# Patient Record
Sex: Female | Born: 1950 | Race: Black or African American | Hispanic: No | Smoking: Former smoker
Health system: Southern US, Community
[De-identification: ages and names within clinical notes are randomized; demographics above are authoritative.]

## PROBLEM LIST (undated history)

## (undated) DIAGNOSIS — R51 Headache: Secondary | ICD-10-CM

## (undated) DIAGNOSIS — Z8719 Personal history of other diseases of the digestive system: Secondary | ICD-10-CM

## (undated) DIAGNOSIS — R002 Palpitations: Secondary | ICD-10-CM

## (undated) DIAGNOSIS — M961 Postlaminectomy syndrome, not elsewhere classified: Secondary | ICD-10-CM

## (undated) DIAGNOSIS — R519 Headache, unspecified: Secondary | ICD-10-CM

## (undated) DIAGNOSIS — T7840XA Allergy, unspecified, initial encounter: Secondary | ICD-10-CM

## (undated) DIAGNOSIS — E7800 Pure hypercholesterolemia, unspecified: Secondary | ICD-10-CM

## (undated) DIAGNOSIS — R200 Anesthesia of skin: Secondary | ICD-10-CM

## (undated) DIAGNOSIS — K219 Gastro-esophageal reflux disease without esophagitis: Secondary | ICD-10-CM

## (undated) DIAGNOSIS — E119 Type 2 diabetes mellitus without complications: Secondary | ICD-10-CM

## (undated) HISTORY — DX: Allergy, unspecified, initial encounter: T78.40XA

## (undated) HISTORY — DX: Palpitations: R00.2

## (undated) HISTORY — PX: SHOULDER SURGERY: SHX246

## (undated) HISTORY — DX: Anesthesia of skin: R20.0

## (undated) HISTORY — PX: COLONOSCOPY: SHX174

## (undated) HISTORY — PX: BREAST SURGERY: SHX581

## (undated) HISTORY — PX: BACK SURGERY: SHX140

## (undated) HISTORY — PX: UPPER GI ENDOSCOPY: SHX6162

---

## 1980-04-26 HISTORY — PX: ABDOMINAL HYSTERECTOMY: SHX81

## 2010-10-29 ENCOUNTER — Encounter: Payer: Self-pay | Admitting: Family Medicine

## 2010-10-29 ENCOUNTER — Ambulatory Visit (INDEPENDENT_AMBULATORY_CARE_PROVIDER_SITE_OTHER): Payer: Self-pay | Admitting: Family Medicine

## 2010-10-29 DIAGNOSIS — E785 Hyperlipidemia, unspecified: Secondary | ICD-10-CM

## 2010-10-29 DIAGNOSIS — K219 Gastro-esophageal reflux disease without esophagitis: Secondary | ICD-10-CM

## 2010-10-29 DIAGNOSIS — I10 Essential (primary) hypertension: Secondary | ICD-10-CM | POA: Insufficient documentation

## 2010-10-29 DIAGNOSIS — M545 Low back pain, unspecified: Secondary | ICD-10-CM | POA: Insufficient documentation

## 2010-10-29 DIAGNOSIS — E1169 Type 2 diabetes mellitus with other specified complication: Secondary | ICD-10-CM | POA: Insufficient documentation

## 2010-10-29 DIAGNOSIS — G8929 Other chronic pain: Secondary | ICD-10-CM

## 2010-10-29 NOTE — Assessment & Plan Note (Addendum)
Her blood pressure is elevated today. Normally she says it runs in the normal range this is a little high for her. We'll recheck her followup in 2 months to make sure that she is truly well controlled. His been a year since her last set of blood work but unfortunately she does not have insurance so we will have to hold on this until she gets some type of medical coverage.

## 2010-10-29 NOTE — Assessment & Plan Note (Signed)
At this point she has chronic low back pain for last 3 years status post fall. She's been treated mainly with narcotic medications. She has never had formal physical therapy. She is also not had any other imaging such as an MRI to further delineate if she may have disc or neural impingement. At this point in time I think she really needs an MRI to see if any further intervention is warranted especially since her pain radiates into both legs. Unfortunately she does not have health insurance at this time. She will get insurance in approximately 6 months. I discussed with her applying to the meniscal health program that can sometimes assist with her medical epidurals in the interim. If she is able to get this and we can hopefully go ahead and move forward in order an MRI and possibly get her in with physical therapy. In the meantime she was given a handout for exercises to do for her low back. She R. he takes hydrocodone when necessary. She said she does not need refills today.

## 2010-10-29 NOTE — Progress Notes (Signed)
Subjective:    Patient ID: Amanda Morris, female    DOB: [birth year], 60 y.o.   MRN: [id number]  HPI Back pain started when fell at work in 02/2008. Feel forward and hit her her right shoulder and then fell backwards.  Has had 2 shoulder surgeries.  REgular MD was following her. Has had xrays done and told OA. Pain has persisted. Pain radiates into both legs down to her feel. Occ numbnes and tingling.  Pain is mostly in her back and hips. Never did PT for her back, but did for her shoulder after surgery. Also has bilateal knee pain.  She c/o of persistant stiffness.    Review of Systems  Constitutional: Negative for fever, diaphoresis and unexpected weight change.       Weakness. Weight gain.   HENT: Positive for tinnitus. Negative for hearing loss and rhinorrhea.        Allergies  Eyes: Negative for visual disturbance.  Respiratory: Negative for cough and wheezing.   Cardiovascular: Positive for palpitations. Negative for chest pain.  Gastrointestinal: Positive for vomiting. Negative for nausea, diarrhea and blood in stool.  Genitourinary: Negative for vaginal bleeding, vaginal discharge and difficulty urinating.       Nocturia  Musculoskeletal: Positive for myalgias and joint swelling. Negative for arthralgias.  Skin: Negative for rash.  Neurological: Positive for dizziness and headaches.  Hematological: Negative for adenopathy. Does not bruise/bleed easily.  Psychiatric/Behavioral: Negative for sleep disturbance and dysphoric mood. The patient is not nervous/anxious.    BP 145/97  Pulse 82  Ht 5\' 4"  (1.626 m)  Wt 236 lb (107.049 kg)  BMI 40.51 kg/m2    Allergies  Allergen Reactions  . Penicillins     Past Medical History  Diagnosis Date  . Allergy   . Palpitations     Past Surgical History  Procedure Date  . Abdominal hysterectomy 1982  . Shoulder surgery 06/2008, 09/2010    Right, by Dr. Anthoney Harada, then Dr. Bayard Beaver    History   Social History  . Marital  Status: Married    Spouse Name: N/A    Number of Children: N/A  . Years of Education: 10th grade   Occupational History  . Disabled.     Social History Main Topics  . Smoking status: Former Smoker    Types: Cigarettes    Quit date: 07/30/2007  . Smokeless tobacco: Not on file  . Alcohol Use: No  . Drug Use: No  . Sexually Active: No   Other Topics Concern  . Not on file   Social History Narrative   Caffeine intake. No regular exercise.    Family History  Problem Relation Age of Onset  . Heart disease Mother   . Diabetes Mother   . Hypertension Mother   . Stroke Mother   . Heart disease Father   . Cancer Sister   . Heart disease Sister   . Diabetes Sister   . Hyperlipidemia Sister   . Hypertension Sister   . Stroke Sister   . Alcohol abuse Brother   . Hyperlipidemia Brother     Current outpatient prescriptions:aspirin 81 MG tablet, Take 81 mg by mouth daily.  , Disp: , Rfl: ;  doxepin (SINEQUAN) 25 MG capsule, Take 25 mg by mouth 2 (two) times daily. , Disp: , Rfl: ;  fish oil-omega-3 fatty acids 1000 MG capsule, Take 2 g by mouth daily.  , Disp: , Rfl: ;  HYDROcodone-acetaminophen (VICODIN ES) 7.5-750 MG per tablet, Take 1 tablet  by mouth every 6 (six) hours as needed.  , Disp: , Rfl:  metoprolol (TOPROL-XL) 50 MG 24 hr tablet, Take 50 mg by mouth 2 (two) times daily.  , Disp: , Rfl: ;  omeprazole (PRILOSEC) 20 MG capsule, Take 20 mg by mouth daily.  , Disp: , Rfl:      Objective:   Physical Exam  Constitutional: She is oriented to person, place, and time. She appears well-developed and well-nourished.  HENT:  Head: Normocephalic and atraumatic.  Cardiovascular: Normal rate, regular rhythm and normal heart sounds.   Pulmonary/Chest: Effort normal and breath sounds normal.  Musculoskeletal:       Decreased lumbar flexion to about 60. Decreased extension. Normal rotation right and left but she has pain with this. Side bending is slightly decreased to the left  compared to the right. She is tender over the entire lumbar spine as well as the right and left paraspinous muscles. She's also tender over both SI joints. Negative straight leg raise bilaterally. Hip knee and ankle strength 5/5/. She does have a lot of crepitus in both knees.  Neurological: She is alert and oriented to person, place, and time.  Skin: Skin is warm and dry.  Psychiatric: She has a normal mood and affect.          Assessment & Plan:

## 2010-11-03 ENCOUNTER — Encounter: Payer: Self-pay | Admitting: Family Medicine

## 2010-11-03 DIAGNOSIS — I471 Supraventricular tachycardia: Secondary | ICD-10-CM | POA: Insufficient documentation

## 2010-12-31 ENCOUNTER — Encounter: Payer: Self-pay | Admitting: Family Medicine

## 2010-12-31 ENCOUNTER — Other Ambulatory Visit: Payer: Self-pay | Admitting: Family Medicine

## 2010-12-31 ENCOUNTER — Ambulatory Visit (INDEPENDENT_AMBULATORY_CARE_PROVIDER_SITE_OTHER): Payer: Self-pay | Admitting: Family Medicine

## 2010-12-31 ENCOUNTER — Telehealth: Payer: Self-pay | Admitting: Family Medicine

## 2010-12-31 VITALS — BP 127/89 | HR 96 | Wt 226.0 lb

## 2010-12-31 DIAGNOSIS — R509 Fever, unspecified: Secondary | ICD-10-CM

## 2010-12-31 DIAGNOSIS — R109 Unspecified abdominal pain: Secondary | ICD-10-CM

## 2010-12-31 DIAGNOSIS — N760 Acute vaginitis: Secondary | ICD-10-CM

## 2010-12-31 DIAGNOSIS — R300 Dysuria: Secondary | ICD-10-CM

## 2010-12-31 LAB — CBC WITH DIFFERENTIAL/PLATELET
HCT: 39.8 % (ref 36.0–46.0)
Hemoglobin: 12.5 g/dL (ref 12.0–15.0)
Lymphocytes Relative: 39 % (ref 12–46)
Lymphs Abs: 3.7 10*3/uL (ref 0.7–4.0)
MCH: 24 pg — ABNORMAL LOW (ref 26.0–34.0)
MCHC: 31.6 g/dL (ref 30.0–36.0)
MCV: 76.4 fL — ABNORMAL LOW (ref 78.0–100.0)
Monocytes Absolute: 0.2 10*3/uL (ref 0.1–1.0)
Monocytes Relative: 2 % — ABNORMAL LOW (ref 3–12)
Neutro Abs: 5.6 10*3/uL (ref 1.7–7.7)
Neutrophils Relative %: 59 % (ref 43–77)
Platelets: 293 10*3/uL (ref 150–400)
RBC: 5.2 MIL/uL — ABNORMAL HIGH (ref 3.87–5.11)
RDW: 15.5 % (ref 11.5–15.5)
WBC: 9.5 10*3/uL (ref 4.0–10.5)

## 2010-12-31 LAB — COMPLETE METABOLIC PANEL WITH GFR
ALT: 17 U/L (ref 0–35)
AST: 25 U/L (ref 0–37)
Albumin: 3.5 g/dL (ref 3.5–5.2)
Alkaline Phosphatase: 95 U/L (ref 39–117)
BUN: 5 mg/dL — ABNORMAL LOW (ref 6–23)
CO2: 28 mEq/L (ref 19–32)
Calcium: 9.1 mg/dL (ref 8.4–10.5)
Chloride: 104 mEq/L (ref 96–112)
Creat: 0.8 mg/dL (ref 0.50–1.10)
GFR, Est African American: >60 mL/min (ref >60)
GFR, Est Non African American: >60 mL/min (ref >60)
Glucose, Bld: 170 mg/dL — ABNORMAL HIGH (ref 70–99)
Potassium: 3.9 mEq/L (ref 3.5–5.3)
Sodium: 138 mEq/L (ref 135–145)
Total Bilirubin: 0.5 mg/dL (ref 0.3–1.2)
Total Protein: 6.7 g/dL (ref 6.0–8.3)

## 2010-12-31 LAB — POCT URINALYSIS DIPSTICK
Blood, UA: NEGATIVE
Glucose, UA: NEGATIVE
Leukocytes, UA: NEGATIVE
Nitrite, UA: NEGATIVE
Protein, UA: NEGATIVE
Spec Grav, UA: 1.025
Urobilinogen, UA: 1
pH, UA: 6

## 2010-12-31 MED ORDER — FLUCONAZOLE 150 MG PO TABS: 150.0000 mg | ORAL_TABLET | Freq: Once | ORAL | Status: AC

## 2010-12-31 MED ORDER — SULFAMETHOXAZOLE-TMP DS 800-160 MG PO TABS: 1.0000 | ORAL_TABLET | Freq: Two times a day (BID) | ORAL | Status: AC

## 2010-12-31 MED ORDER — METRONIDAZOLE 500 MG PO TABS: 500.0000 mg | ORAL_TABLET | Freq: Three times a day (TID) | ORAL | Status: AC

## 2010-12-31 NOTE — Progress Notes (Signed)
  Subjective:    Patient ID: Amanda Morris, female    DOB: 1950-09-11, 60 y.o.   MRN: [id number]  HPI She stopped her metoprolol for palpitations. She says she felt she no longer needed it. She wasn't really using it for BP.   Went ED for abdominal pain at [facility] about 3-4 weeks ago.  Still has a vaginal  rash. Using vagisil and monistat 7.  Know when urinates the feels like labor pains.  Has been vomiting frequently.  Temp 100.3.  Just did xrays at hos per the pt. .  She is having lower abdominal pain as well. No vomiting or diarrhea. No known triggers. No alleviating or worsening of symptoms. She is not currently taking any medications for this.  Per hosp records they did do a renal CT to urle out stones and it was neg.  no blood in the stool or urine.   Review of Systems     Objective:   Physical Exam  Constitutional: She is oriented to person, place, and time. She appears well-developed and well-nourished.  HENT:  Head: Normocephalic and atraumatic.  Cardiovascular: Normal rate, regular rhythm and normal heart sounds.   Pulmonary/Chest: Effort normal and breath sounds normal.  Abdominal: Soft. Bowel sounds are normal. She exhibits no distension and no mass. There is tenderness. There is no rebound and no guarding.       Tender in teh LUQ and LLQ  And RLQ.  She is very tendre.  No rebounc.  Dec BS.   Musculoskeletal: She exhibits no edema.  Neurological: She is alert and oriented to person, place, and time.  Skin: Skin is warm and dry.  Psychiatric: She has a normal mood and affect. Her behavior is normal.          Assessment & Plan:  Abdominal Pain with tenderness. Worriesome for diverticulitis. UA is neg her but will send a culture.  Will also send a wet prep and go ahed and tx for yeast infection. She did have a negative abdominal CT for renal stones approximately 3 or 4 weeks ago. I did receive a copy of the report from the emergency room. Because she does not have  insurance at this time, i will treat her as if she is having an acute episode of diverticulitis. I did order a CBC stat as well as a CMP. Certainly if she gets worse easier the emergency department. Right now she can take by mouth since she is not vomiting.  Vaginitis-wet prep was collected. I will go ahead and send a dose of Diflucan for possible yeast vaginitis.

## 2010-12-31 NOTE — Telephone Encounter (Signed)
Lab added

## 2010-12-31 NOTE — Telephone Encounter (Signed)
Call labs and see if they can add an HgA1C.

## 2011-01-01 ENCOUNTER — Telehealth: Payer: Self-pay | Admitting: Family Medicine

## 2011-01-01 LAB — WET PREP, GENITAL
Clue Cells Wet Prep HPF POC: NONE SEEN
Trich, Wet Prep: NONE SEEN
Yeast Wet Prep HPF POC: NONE SEEN

## 2011-01-01 LAB — HEMOGLOBIN A1C
Hgb A1c MFr Bld: 9.2 % — ABNORMAL HIGH (ref <5.7)
Mean Plasma Glucose: 217 mg/dL — ABNORMAL HIGH (ref <117)

## 2011-01-01 NOTE — Telephone Encounter (Signed)
Callpt: Wet prep was normal but she can still take the diflucan we sent over and see if helps her sxs.

## 2011-01-01 NOTE — Telephone Encounter (Signed)
Call pt: WEt prep was neg.  Cn still take the diflucan we send over and see if feels better.

## 2011-01-02 LAB — URINE CULTURE: Colony Count: >=100000

## 2011-01-04 ENCOUNTER — Telehealth: Payer: Self-pay | Admitting: Family Medicine

## 2011-01-04 NOTE — Telephone Encounter (Signed)
Call patient: Based on her blood work she does have diabetes. Without a doubt. This is likely the cause of her recent abdominal pain. She is to make a followup appointment with me this week so that we can discuss treatment and getting her on medication. Also her urine culture was negative it was contaminated.

## 2011-01-04 NOTE — Telephone Encounter (Signed)
Pt aware.

## 2011-01-04 NOTE — Telephone Encounter (Signed)
Pt.notified

## 2011-01-11 ENCOUNTER — Ambulatory Visit (INDEPENDENT_AMBULATORY_CARE_PROVIDER_SITE_OTHER): Payer: Self-pay | Admitting: Family Medicine

## 2011-01-11 ENCOUNTER — Other Ambulatory Visit: Payer: Self-pay | Admitting: Family Medicine

## 2011-01-11 ENCOUNTER — Encounter: Payer: Self-pay | Admitting: Family Medicine

## 2011-01-11 DIAGNOSIS — E119 Type 2 diabetes mellitus without complications: Secondary | ICD-10-CM

## 2011-01-11 DIAGNOSIS — M549 Dorsalgia, unspecified: Secondary | ICD-10-CM

## 2011-01-11 DIAGNOSIS — R1110 Vomiting, unspecified: Secondary | ICD-10-CM

## 2011-01-11 LAB — POCT GLYCOSYLATED HEMOGLOBIN (HGB A1C): Hemoglobin A1C: 8.7

## 2011-01-11 MED ORDER — PROMETHAZINE HCL 25 MG PO TABS: 25.0000 mg | ORAL_TABLET | Freq: Four times a day (QID) | ORAL | Status: DC | PRN

## 2011-01-11 MED ORDER — FREESTYLE SYSTEM KIT: 1.0000 | PACK | Status: AC | PRN

## 2011-01-11 MED ORDER — GLIPIZIDE 5 MG PO TABS: 5.0000 mg | ORAL_TABLET | Freq: Two times a day (BID) | ORAL | Status: DC

## 2011-01-11 NOTE — Progress Notes (Signed)
  Subjective:    Patient ID: Amanda Morris, female    DOB: Feb 11, 1951, 60 y.o.   MRN: [id number]  HPI Sttill with abdominal pain and with vomiting.  I feel that she could have diverticulitis her white count was normal so I did treat her with antibiotics. She completed the ABX and is not any better. Feels the same, not worse.  She had a recent abdominal CT that was to evaluate for renal stones, that was negative.  Having back pain too that is worse after walking and gets sharp pains up and down the back. I would like to do an MRI of her lumbar spine without we'll need to wait for November until she gets health insurance. She does use her hydrocodone when necessary and has been very good about using it sparingly. At this point, recommended she avoid NSAIDs because she is having the nausea and vomiting. Tylenol be a better choice.  Here to f/u abnormal glucose seen on her labs.  + family hx of diabetes.  She says she really doesn't eat any sweet foods. She has never been told she said that before.   Review of Systems     Objective:   Physical Exam  Constitutional: She is oriented to person, place, and time. She appears well-developed and well-nourished.  HENT:  Head: Normocephalic and atraumatic.  Cardiovascular: Normal rate, regular rhythm and normal heart sounds.   Pulmonary/Chest: Effort normal and breath sounds normal.  Abdominal: Soft. Bowel sounds are normal. There is tenderness.       Diffusely tender.   Neurological: She is alert and oriented to person, place, and time.  Skin: Skin is warm and dry.  Psychiatric: She has a normal mood and affect. Her behavior is normal.          Assessment & Plan:  DM- Discussed new dx.  Her A1C is 8.7 today. I discussed the importance of not eating sweets and decreasing her portions at supper carbohydrates. I would like her to get a glucose meter and start checking her sugar 3 days per week in the morning fasting. Unfortunately she will not  have insurance for about 7 more weeks I did schedule her a followup appointment but it is 7 weeks out. Typically I usually see patient's back or earlier if they have a new diagnosis. At that time she does have insurance we can check her urine microalbumin, get her scheduled for an eye exam, update her vaccines et Karie Soda. We'll start glipizide. I did not start with metformin because she started having abdominal pain and vomiting and underwent to make this worse but having diarrhea to it.  Abdominal pain/vomiting-she did not feel better after treatment for possible diverticulitis. I did not rescan her abdomen because she had a recent renal stone CT in the emergency department and she does not have insurance. She's not feeling any better after the antibiotic so I will go ahead and refer her to gastroenterology. I did give her some Phenergan to use when necessary until she gets there. Certainly some of her abdominal discomfort and vomiting could be from her elevated sugars. They're wishing to see if she improves after starting the glipizide.  Back pain-at this point is not a lot I can do for her until she gets insurance. At that point we consider an MRI and possibly physical therapy.

## 2011-01-11 NOTE — Telephone Encounter (Signed)
Pt called and said she was seen today in our office and was suppose to have gotten two more prescriptions one for nausea, and the other is diabetes med. Plan:  Reviewed the pt chart and phenergan and her glipizide,and a BS machine "Freestyle" was sent electronically.  Pt informed to give it a little while and call the pharm to make sure they have ready for her to pup. Jarvis Newcomer, LPN [facility]

## 2011-01-25 ENCOUNTER — Other Ambulatory Visit: Payer: Self-pay | Admitting: Family Medicine

## 2011-01-25 NOTE — Telephone Encounter (Signed)
Pt called and requested refill of phenergan. Plan:  When looking at pt chart med was given til pt seen by GI.  Was seen on 01-14-11.  Pt contacted to call their office to see what the next step would be.  Pt said they said if N/V conts they may want to scan. Jarvis Newcomer, LPN [facility]

## 2011-03-02 ENCOUNTER — Ambulatory Visit (INDEPENDENT_AMBULATORY_CARE_PROVIDER_SITE_OTHER): Payer: No Typology Code available for payment source | Admitting: Family Medicine

## 2011-03-02 ENCOUNTER — Encounter: Payer: Self-pay | Admitting: Family Medicine

## 2011-03-02 VITALS — BP 125/87 | HR 89 | Wt 229.0 lb

## 2011-03-02 DIAGNOSIS — Z23 Encounter for immunization: Secondary | ICD-10-CM

## 2011-03-02 DIAGNOSIS — IMO0001 Reserved for inherently not codable concepts without codable children: Secondary | ICD-10-CM

## 2011-03-02 DIAGNOSIS — M545 Low back pain, unspecified: Secondary | ICD-10-CM

## 2011-03-02 DIAGNOSIS — E119 Type 2 diabetes mellitus without complications: Secondary | ICD-10-CM

## 2011-03-02 LAB — POCT UA - MICROALBUMIN: Creatinine, POC: 300 mg/dL

## 2011-03-02 MED ORDER — HYDROCODONE-ACETAMINOPHEN 7.5-750 MG PO TABS: 1.0000 | ORAL_TABLET | Freq: Four times a day (QID) | ORAL | Status: DC | PRN

## 2011-03-02 MED ORDER — METOPROLOL SUCCINATE ER 50 MG PO TB24: 50.0000 mg | ORAL_TABLET | Freq: Two times a day (BID) | ORAL | Status: DC

## 2011-03-02 MED ORDER — GLIPIZIDE 5 MG PO TABS: 5.0000 mg | ORAL_TABLET | Freq: Two times a day (BID) | ORAL | Status: DC

## 2011-03-02 NOTE — Patient Instructions (Signed)
When you go to get your eye exam please have them send me a report Make sure really watching your diet and getting regular exercise.

## 2011-03-02 NOTE — Progress Notes (Signed)
  Subjective:    Patient ID: Amanda Morris, female    DOB: 1950-10-04, 60 y.o.   MRN: [id number]  Diabetes She presents for her follow-up diabetic visit. She has type 2 diabetes mellitus. Her disease course has been improving. There are no hypoglycemic associated symptoms. Pertinent negatives for diabetes include no chest pain, no polydipsia, no polyphagia, no polyuria and no weight loss. Symptoms are stable. Current diabetic treatment includes oral agent (monotherapy). She is compliant with treatment all of the time. Her weight is stable. She is following a generally healthy diet. Home blood sugar record trend: Hasn't been able to check her sugars.  Eye exam is not current.   She would like to address her back pain again today. We discussed this at previous visits but were unable to do any additional workup because of her lack of insurance.   Review of Systems  Constitutional: Negative for weight loss.  Cardiovascular: Negative for chest pain.  Genitourinary: Negative for polyuria.  Hematological: Negative for polydipsia and polyphagia.       Objective:   Physical Exam  Constitutional: She is oriented to person, place, and time. She appears well-developed and well-nourished.  HENT:  Head: Normocephalic and atraumatic.  Cardiovascular: Normal rate, regular rhythm and normal heart sounds.   Pulmonary/Chest: Effort normal and breath sounds normal.  Musculoskeletal: She exhibits no edema.  Neurological: She is alert and oriented to person, place, and time.  Skin: Skin is warm and dry.  Psychiatric: She has a normal mood and affect. Her behavior is normal.          Assessment & Plan:  DM- Unable to get a glucometer secondary to cost.  F.U in 2 mo. Work on diet and exercise. Foot exam performed today. Urine micro performed today. Flu vaccine given  . . I reminded her to get an up-to-date eye exam.  Back Pain- Still using the hydrocodone.  We have not gotten xrays in the past due to  lack of insurance. Has had painfor 3 years. It is the worse when she has been sitting or standing for long periods nda then try to get up. Says she feels like her bones are sliding vs each other. Will send for xray today. She has not had any physical therapy.

## 2011-03-03 ENCOUNTER — Other Ambulatory Visit: Payer: Self-pay | Admitting: Family Medicine

## 2011-03-03 ENCOUNTER — Ambulatory Visit
Admission: RE | Admit: 2011-03-03 | Discharge: 2011-03-03 | Disposition: A | Discharge status: Home / Self Care | Payer: No Typology Code available for payment source | Source: Ambulatory Visit | Attending: Family Medicine | Admitting: Family Medicine

## 2011-03-03 DIAGNOSIS — M545 Low back pain, unspecified: Secondary | ICD-10-CM

## 2011-03-03 DIAGNOSIS — M549 Dorsalgia, unspecified: Secondary | ICD-10-CM

## 2011-03-03 LAB — LIPID PANEL
Cholesterol: 186 mg/dL (ref 0–200)
HDL: 37 mg/dL — ABNORMAL LOW (ref >39)
LDL Cholesterol: 122 mg/dL — ABNORMAL HIGH (ref 0–99)
Total CHOL/HDL Ratio: 5 Ratio
Triglycerides: 136 mg/dL (ref <150)
VLDL: 27 mg/dL (ref 0–40)

## 2011-03-05 ENCOUNTER — Ambulatory Visit: Payer: No Typology Code available for payment source | Attending: Family Medicine | Admitting: Physical Therapy

## 2011-03-05 DIAGNOSIS — M545 Low back pain, unspecified: Secondary | ICD-10-CM | POA: Insufficient documentation

## 2011-03-05 DIAGNOSIS — M6281 Muscle weakness (generalized): Secondary | ICD-10-CM | POA: Insufficient documentation

## 2011-03-05 DIAGNOSIS — IMO0001 Reserved for inherently not codable concepts without codable children: Secondary | ICD-10-CM | POA: Insufficient documentation

## 2011-03-08 ENCOUNTER — Ambulatory Visit: Payer: No Typology Code available for payment source | Admitting: Physical Therapy

## 2011-03-11 ENCOUNTER — Ambulatory Visit: Payer: No Typology Code available for payment source | Admitting: Physical Therapy

## 2011-03-16 ENCOUNTER — Ambulatory Visit: Payer: No Typology Code available for payment source | Admitting: Physical Therapy

## 2011-03-23 ENCOUNTER — Ambulatory Visit: Payer: No Typology Code available for payment source | Admitting: Physical Therapy

## 2011-03-25 ENCOUNTER — Ambulatory Visit: Payer: No Typology Code available for payment source | Admitting: Physical Therapy

## 2011-04-02 ENCOUNTER — Other Ambulatory Visit: Payer: Self-pay | Admitting: Family Medicine

## 2011-04-05 ENCOUNTER — Encounter: Payer: Self-pay | Admitting: Family Medicine

## 2011-04-05 ENCOUNTER — Ambulatory Visit (INDEPENDENT_AMBULATORY_CARE_PROVIDER_SITE_OTHER): Payer: No Typology Code available for payment source | Admitting: Family Medicine

## 2011-04-05 VITALS — BP 124/78 | HR 85 | Wt 229.0 lb

## 2011-04-05 DIAGNOSIS — M545 Low back pain, unspecified: Secondary | ICD-10-CM

## 2011-04-05 DIAGNOSIS — M79604 Pain in right leg: Secondary | ICD-10-CM

## 2011-04-05 MED ORDER — HYDROCODONE-ACETAMINOPHEN 7.5-750 MG PO TABS: 1.0000 | ORAL_TABLET | Freq: Four times a day (QID) | ORAL | Status: DC | PRN

## 2011-04-05 NOTE — Progress Notes (Signed)
  Subjective:    Patient ID: Amanda Morris, female    DOB: [birth year], 60 y.o.   MRN: [id number]  HPI Here to f/u for her back. Has been going to PT and they told her to follow back with me as she is not getting better.  She still has lumbar back pain that radiates into both thighs. It is worse with prolonged sitting or standing. It is difficult at work.  Review of Systems     Objective:   Physical Exam  Constitutional: She appears well-developed and well-nourished.  Musculoskeletal:       Tender over the lumbar spine and the left lumbar spine.  Hip, knee and ankle strength 5/5 bilat.  Patellar reflex 1+ bilat.  Dec lumbar flexion. Normal extension. Normal rotation right and left. She has some difficulty going from sitting to standing.          Assessment & Plan:  Low back Pain-we did an x-ray at her last visit which showed moderate degenerative changes. Unfortunately she has not made any improvements with physical therapy. She would like a refill on her narcotic pain medication which I did refill today. I will schedule her for an MRI of the lumbar spine for further evaluation. Depending on the results we may need to get her further evaluated by an orthopedist or neurosurgeon.

## 2011-04-05 NOTE — Patient Instructions (Signed)
We will schedule your MRI of your back.

## 2011-05-01 ENCOUNTER — Ambulatory Visit
Admission: RE | Admit: 2011-05-01 | Discharge: 2011-05-01 | Disposition: A | Discharge status: Home / Self Care | Payer: No Typology Code available for payment source | Source: Ambulatory Visit | Attending: Family Medicine | Admitting: Family Medicine

## 2011-05-01 DIAGNOSIS — M545 Low back pain: Secondary | ICD-10-CM

## 2011-05-01 DIAGNOSIS — M79604 Pain in right leg: Secondary | ICD-10-CM

## 2011-05-04 ENCOUNTER — Encounter: Payer: Self-pay | Admitting: Family Medicine

## 2011-05-04 ENCOUNTER — Ambulatory Visit (INDEPENDENT_AMBULATORY_CARE_PROVIDER_SITE_OTHER): Payer: No Typology Code available for payment source | Admitting: Family Medicine

## 2011-05-04 DIAGNOSIS — E119 Type 2 diabetes mellitus without complications: Secondary | ICD-10-CM

## 2011-05-04 DIAGNOSIS — M549 Dorsalgia, unspecified: Secondary | ICD-10-CM

## 2011-05-04 LAB — POCT GLYCOSYLATED HEMOGLOBIN (HGB A1C): Hemoglobin A1C: 7.7

## 2011-05-04 MED ORDER — METFORMIN HCL 500 MG PO TABS: 500.0000 mg | ORAL_TABLET | Freq: Two times a day (BID) | ORAL | Status: DC

## 2011-05-04 NOTE — Progress Notes (Signed)
  Subjective:    Patient ID: Amanda Morris, female    DOB: 26-May-1950, 61 y.o.   MRN: [id number]  Diabetes She presents for her follow-up diabetic visit. She has type 2 diabetes mellitus. Her disease course has been stable. There are no hypoglycemic associated symptoms. Pertinent negatives for diabetes include no chest pain, no polydipsia, no polyphagia, no polyuria and no visual change. Symptoms are stable. Risk factors for coronary artery disease include sedentary lifestyle and obesity. She is compliant with treatment all of the time. She has not had a previous visit with a dietician. She rarely participates in exercise. There is no change in her home blood glucose trend. An ACE inhibitor/angiotensin II receptor blocker is not being taken. Eye exam is current.      Review of Systems  Cardiovascular: Negative for chest pain.  Genitourinary: Negative for polyuria.  Hematological: Negative for polydipsia and polyphagia.       Objective:   Physical Exam  Constitutional: She is oriented to person, place, and time. She appears well-developed and well-nourished.  HENT:  Head: Normocephalic and atraumatic.  Eyes: Conjunctivae are normal. Pupils are equal, round, and reactive to light.  Cardiovascular: Normal rate, regular rhythm and normal heart sounds.        No carotid bruits.   Pulmonary/Chest: Effort normal and breath sounds normal.  Neurological: She is alert and oriented to person, place, and time.  Skin: Skin is warm and dry.  Psychiatric: She has a normal mood and affect. Her behavior is normal.          Assessment & Plan:  DM- her A1c is down 1 point which is excellent. Her to continue the glipizide we will add a low dose of metformin twice a day. I did want her potential side effects. Followup in 3 months. Continue to work on diet and exercise as much as possible with her back pain. Lab Results  Component Value Date   HGBA1C 7.7 05/04/2011    Low back Pain - dis usse MRI  results. I gave her copy of her MRI results. At this point in time I do recommend referral to orthopedist and she has tried physical therapy and has failed and is still having significant pain and numbness radiating into her legs bilaterally. She is doing well on her current pain regimen. She feels it is helping her.

## 2011-05-05 ENCOUNTER — Other Ambulatory Visit: Payer: Self-pay | Admitting: Family Medicine

## 2011-05-05 ENCOUNTER — Encounter: Payer: Self-pay | Admitting: Family Medicine

## 2011-05-05 DIAGNOSIS — E119 Type 2 diabetes mellitus without complications: Secondary | ICD-10-CM

## 2011-05-05 DIAGNOSIS — E1169 Type 2 diabetes mellitus with other specified complication: Secondary | ICD-10-CM | POA: Insufficient documentation

## 2011-05-05 LAB — LIPID PANEL
Cholesterol: 192 mg/dL (ref 0–200)
HDL: 37 mg/dL — ABNORMAL LOW (ref >39)
LDL Cholesterol: 121 mg/dL — ABNORMAL HIGH (ref 0–99)
Total CHOL/HDL Ratio: 5.2 Ratio
Triglycerides: 168 mg/dL — ABNORMAL HIGH (ref <150)
VLDL: 34 mg/dL (ref 0–40)

## 2011-05-05 MED ORDER — PRAVASTATIN SODIUM 20 MG PO TABS: 20.0000 mg | ORAL_TABLET | Freq: Every day | ORAL | Status: DC

## 2011-05-06 LAB — COMPLETE METABOLIC PANEL WITH GFR
ALT: 11 U/L (ref 0–35)
AST: 13 U/L (ref 0–37)
Albumin: 3.9 g/dL (ref 3.5–5.2)
Alkaline Phosphatase: 90 U/L (ref 39–117)
BUN: 7 mg/dL (ref 6–23)
CO2: 25 mEq/L (ref 19–32)
Calcium: 9 mg/dL (ref 8.4–10.5)
Chloride: 105 mEq/L (ref 96–112)
Creat: 0.71 mg/dL (ref 0.50–1.10)
GFR, Est African American: >89 mL/min
GFR, Est Non African American: >89 mL/min
Glucose, Bld: 129 mg/dL — ABNORMAL HIGH (ref 70–99)
Potassium: 4 mEq/L (ref 3.5–5.3)
Sodium: 141 mEq/L (ref 135–145)
Total Bilirubin: 0.3 mg/dL (ref 0.3–1.2)
Total Protein: 6.9 g/dL (ref 6.0–8.3)

## 2011-05-24 ENCOUNTER — Encounter: Payer: Self-pay | Admitting: Family Medicine

## 2011-07-02 ENCOUNTER — Other Ambulatory Visit: Payer: Self-pay | Admitting: Family Medicine

## 2011-08-02 ENCOUNTER — Encounter: Payer: Self-pay | Admitting: Family Medicine

## 2011-08-02 ENCOUNTER — Ambulatory Visit (INDEPENDENT_AMBULATORY_CARE_PROVIDER_SITE_OTHER): Payer: No Typology Code available for payment source | Admitting: Family Medicine

## 2011-08-02 DIAGNOSIS — E785 Hyperlipidemia, unspecified: Secondary | ICD-10-CM

## 2011-08-02 DIAGNOSIS — E119 Type 2 diabetes mellitus without complications: Secondary | ICD-10-CM

## 2011-08-02 DIAGNOSIS — I10 Essential (primary) hypertension: Secondary | ICD-10-CM

## 2011-08-02 LAB — POCT GLYCOSYLATED HEMOGLOBIN (HGB A1C): Hemoglobin A1C: 8

## 2011-08-02 MED ORDER — GLIPIZIDE 10 MG PO TABS: 10.0000 mg | ORAL_TABLET | Freq: Two times a day (BID) | ORAL | Status: DC

## 2011-08-02 MED ORDER — CARVEDILOL 12.5 MG PO TABS: 12.5000 mg | ORAL_TABLET | Freq: Two times a day (BID) | ORAL | Status: DC

## 2011-08-02 MED ORDER — PRAVASTATIN SODIUM 20 MG PO TABS: 20.0000 mg | ORAL_TABLET | Freq: Every day | ORAL | Status: DC

## 2011-08-02 MED ORDER — METFORMIN HCL 500 MG PO TABS: 500.0000 mg | ORAL_TABLET | Freq: Two times a day (BID) | ORAL | Status: DC

## 2011-08-02 NOTE — Progress Notes (Signed)
  Subjective:    Patient ID: Amanda Morris, female    DOB: [birth year], 61 y.o.   MRN: [id number]  HPI DM - sometimes eats late.  Says has been taking her meds.  Getting some regular exercise.  Has been eating some ice cream and cake.  No lows. No cuts or wounds on the feer that arent' healing well. She has not been checking her sugars because she says she cannot afford the test strips right now.  HTN -doing well. No CP or SOB. Taking meds without difficulty.    Hyperlipidemia-she's doing on her statin. No myalgias or side effects.  Review of Systems     Objective:   Physical Exam  Constitutional: She is oriented to person, place, and time. She appears well-developed and well-nourished.  HENT:  Head: Normocephalic and atraumatic.  Cardiovascular: Normal rate, regular rhythm and normal heart sounds.   Pulmonary/Chest: Effort normal and breath sounds normal.  Musculoskeletal: She exhibits no edema.  Neurological: She is alert and oriented to person, place, and time.  Skin: Skin is warm and dry.  Psychiatric: She has a normal mood and affect. Her behavior is normal.          Assessment & Plan:  DM- not well-controlled. Her A1c is up to 8.0 today. It was 7.73 months ago. We discussed working on her diet. She has been getting some regular exercise was also gained some weight. We will also increase her glipizide from 5 mg 10 mg. She does not tolerate higher doses of metformin so we will keep that at 500 mg twice a day. I will also change her to metoprolol to carvedilol and this will likely help with her A1c as well. Followup in 3 months. I also mentioned getting a [employer] glucometer and strips for a cheaper option, since she has been unable to afford her test strips.  HTN- well-controlled. Will change metoprolol to carvedilol to see if this improves her blood sugars.  Hyperlipidemia-she's to recheck her lipids since she started her statin to 3 months ago. Refill sent to pharmacy.

## 2011-08-03 ENCOUNTER — Other Ambulatory Visit: Payer: Self-pay | Admitting: Family Medicine

## 2011-08-04 LAB — LIPID PANEL
Cholesterol: 148 mg/dL (ref 0–200)
HDL: 36 mg/dL — ABNORMAL LOW (ref >39)
LDL Cholesterol: 82 mg/dL (ref 0–99)
Total CHOL/HDL Ratio: 4.1 Ratio
Triglycerides: 151 mg/dL — ABNORMAL HIGH (ref <150)
VLDL: 30 mg/dL (ref 0–40)

## 2011-08-04 LAB — COMPLETE METABOLIC PANEL WITH GFR
ALT: 9 U/L (ref 0–35)
AST: 17 U/L (ref 0–37)
Albumin: 3.9 g/dL (ref 3.5–5.2)
Alkaline Phosphatase: 70 U/L (ref 39–117)
BUN: 8 mg/dL (ref 6–23)
CO2: 26 mEq/L (ref 19–32)
Calcium: 8.7 mg/dL (ref 8.4–10.5)
Chloride: 107 mEq/L (ref 96–112)
Creat: 0.7 mg/dL (ref 0.50–1.10)
GFR, Est African American: >89 mL/min
GFR, Est Non African American: >89 mL/min
Glucose, Bld: 137 mg/dL — ABNORMAL HIGH (ref 70–99)
Potassium: 3.4 mEq/L — ABNORMAL LOW (ref 3.5–5.3)
Sodium: 142 mEq/L (ref 135–145)
Total Bilirubin: 0.3 mg/dL (ref 0.3–1.2)
Total Protein: 6.7 g/dL (ref 6.0–8.3)

## 2011-08-30 ENCOUNTER — Other Ambulatory Visit: Payer: Self-pay | Admitting: *Deleted

## 2011-08-30 MED ORDER — CARVEDILOL 12.5 MG PO TABS: 12.5000 mg | ORAL_TABLET | Freq: Two times a day (BID) | ORAL | Status: DC

## 2011-08-30 MED ORDER — PRAVASTATIN SODIUM 20 MG PO TABS: 20.0000 mg | ORAL_TABLET | Freq: Every day | ORAL | Status: DC

## 2011-08-30 MED ORDER — FLUTICASONE PROPIONATE 50 MCG/ACT NA SUSP: 1.0000 | Freq: Every day | NASAL | Status: DC

## 2011-08-30 MED ORDER — GLIPIZIDE 10 MG PO TABS: 10.0000 mg | ORAL_TABLET | Freq: Two times a day (BID) | ORAL | Status: DC

## 2011-08-30 MED ORDER — METFORMIN HCL 500 MG PO TABS: 500.0000 mg | ORAL_TABLET | Freq: Two times a day (BID) | ORAL | Status: DC

## 2011-09-23 LAB — TSH: TSH: 16 u[IU]/mL — AB (ref 0.41–5.90)

## 2011-09-23 LAB — CBC AND DIFFERENTIAL
HCT: 40 % (ref 36–46)
Hemoglobin: 12.3 g/dL (ref 12.0–16.0)
Platelets: 254 10*3/uL (ref 150–399)

## 2011-09-23 LAB — HEPATIC FUNCTION PANEL
ALT: 16 U/L (ref 7–35)
AST: 18 U/L (ref 13–35)

## 2011-09-23 LAB — BASIC METABOLIC PANEL
BUN: 7 mg/dL (ref 4–21)
Creatinine: 0.7 mg/dL (ref 0.5–1.1)
Glucose: 121 mg/dL
Potassium: 3.4 mmol/L (ref 3.4–5.3)

## 2011-09-29 ENCOUNTER — Telehealth: Payer: Self-pay | Admitting: *Deleted

## 2011-09-29 MED ORDER — HYDROCODONE-ACETAMINOPHEN 7.5-750 MG PO TABS: 1.0000 | ORAL_TABLET | Freq: Four times a day (QID) | ORAL | Status: DC | PRN

## 2011-09-29 NOTE — Telephone Encounter (Signed)
She states she seen a doctor in [place] twice and received 2 injections.

## 2011-09-29 NOTE — Telephone Encounter (Addendum)
Is she seeing Dr. Alvester Morin for injection. I know she saw Dr. Lajoyce Corners back in Feb. He then referred her to Dr. Alvester Morin for possible injection.

## 2011-09-29 NOTE — Telephone Encounter (Signed)
Printed refill. Ok to fax.

## 2011-09-29 NOTE — Telephone Encounter (Signed)
Pt states she takes it for her back and arms.

## 2011-09-29 NOTE — Telephone Encounter (Signed)
Pt is requesting a refill on vicodin. Please advise.

## 2011-10-04 ENCOUNTER — Telehealth: Payer: Self-pay | Admitting: Family Medicine

## 2011-10-04 NOTE — Telephone Encounter (Signed)
Call pt: got copy  Of labs. MCV is low on her hemoglobin. See if she can check a ferritin.  I think she checks her labs at work. Her liver enzymes were normal. Her potassium was still just slightly borderline. We will keep an eye on this. Recheck in 6 months.

## 2011-10-04 NOTE — Telephone Encounter (Addendum)
Pt states she had her labs drawn downstairs but i didn't see recent lab order. Pt did not want to add a ferritin level because she does not have insurance and wanted to call first to see how much it cost

## 2011-10-05 ENCOUNTER — Encounter: Payer: Self-pay | Admitting: *Deleted

## 2011-10-15 LAB — IRON
Ferritin: 52 ng/mL (ref 9.0–150.0)
Iron Saturation: 13
Iron: 43
TIBC: 338

## 2011-10-26 ENCOUNTER — Telehealth: Payer: Self-pay | Admitting: Family Medicine

## 2011-10-26 NOTE — Telephone Encounter (Signed)
Call pt: Amanda Morris lab and stores look great!  No Amanda Morris deficiency.

## 2011-10-27 NOTE — Telephone Encounter (Signed)
Pt.notified

## 2011-11-01 ENCOUNTER — Ambulatory Visit: Payer: No Typology Code available for payment source | Admitting: Family Medicine

## 2011-11-01 ENCOUNTER — Encounter: Payer: Self-pay | Admitting: *Deleted

## 2012-03-28 ENCOUNTER — Ambulatory Visit: Payer: Self-pay | Admitting: Family Medicine

## 2012-06-23 DIAGNOSIS — D649 Anemia, unspecified: Secondary | ICD-10-CM | POA: Diagnosis not present

## 2012-06-23 DIAGNOSIS — D539 Nutritional anemia, unspecified: Secondary | ICD-10-CM | POA: Diagnosis not present

## 2012-06-23 DIAGNOSIS — N390 Urinary tract infection, site not specified: Secondary | ICD-10-CM | POA: Diagnosis not present

## 2012-06-23 DIAGNOSIS — K2100 Gastro-esophageal reflux disease with esophagitis, without bleeding: Secondary | ICD-10-CM | POA: Diagnosis not present

## 2012-06-23 DIAGNOSIS — K219 Gastro-esophageal reflux disease without esophagitis: Secondary | ICD-10-CM | POA: Diagnosis not present

## 2012-06-23 DIAGNOSIS — E109 Type 1 diabetes mellitus without complications: Secondary | ICD-10-CM | POA: Diagnosis not present

## 2012-06-23 LAB — TSH: TSH: 1.6 u[IU]/mL (ref 0.41–5.90)

## 2012-06-23 LAB — HEMOGLOBIN A1C: Hgb A1c MFr Bld: 7.5 % — AB (ref 4.0–6.0)

## 2012-06-23 LAB — HEPATIC FUNCTION PANEL
ALT: 15 U/L (ref 7–35)
AST: 17 U/L (ref 13–35)

## 2012-06-23 LAB — BASIC METABOLIC PANEL
BUN: 7 mg/dL (ref 4–21)
Creatinine: 0.7 mg/dL (ref 0.5–1.1)

## 2012-06-27 ENCOUNTER — Telehealth: Payer: Self-pay | Admitting: Family Medicine

## 2012-06-27 ENCOUNTER — Encounter: Payer: Self-pay | Admitting: *Deleted

## 2012-06-27 NOTE — Telephone Encounter (Signed)
Called patient and informed her needs appointment and transferred to schedule appt. Barry Dienes, LPN

## 2012-06-27 NOTE — Telephone Encounter (Signed)
Call pt: Needs appt. Hasn't been seen since April for her DM.

## 2012-07-04 ENCOUNTER — Encounter: Payer: Self-pay | Admitting: Family Medicine

## 2012-07-04 ENCOUNTER — Ambulatory Visit (INDEPENDENT_AMBULATORY_CARE_PROVIDER_SITE_OTHER): Payer: Medicare Other | Admitting: Family Medicine

## 2012-07-04 VITALS — BP 118/79 | HR 79 | Ht 64.0 in | Wt 220.0 lb

## 2012-07-04 DIAGNOSIS — E119 Type 2 diabetes mellitus without complications: Secondary | ICD-10-CM

## 2012-07-04 DIAGNOSIS — IMO0001 Reserved for inherently not codable concepts without codable children: Secondary | ICD-10-CM | POA: Diagnosis not present

## 2012-07-04 DIAGNOSIS — Z1231 Encounter for screening mammogram for malignant neoplasm of breast: Secondary | ICD-10-CM | POA: Diagnosis not present

## 2012-07-04 DIAGNOSIS — I10 Essential (primary) hypertension: Secondary | ICD-10-CM | POA: Diagnosis not present

## 2012-07-04 DIAGNOSIS — K219 Gastro-esophageal reflux disease without esophagitis: Secondary | ICD-10-CM

## 2012-07-04 LAB — COMPLETE METABOLIC PANEL WITH GFR
ALT: 16 U/L (ref 0–35)
AST: 19 U/L (ref 0–37)
Albumin: 4 g/dL (ref 3.5–5.2)
Alkaline Phosphatase: 101 U/L (ref 39–117)
BUN: 10 mg/dL (ref 6–23)
CO2: 25 mEq/L (ref 19–32)
Calcium: 9.8 mg/dL (ref 8.4–10.5)
Chloride: 104 mEq/L (ref 96–112)
Creat: 0.81 mg/dL (ref 0.50–1.10)
GFR, Est African American: >89 mL/min
GFR, Est Non African American: 80 mL/min
Glucose, Bld: 128 mg/dL — ABNORMAL HIGH (ref 70–99)
Potassium: 4.1 mEq/L (ref 3.5–5.3)
Sodium: 141 mEq/L (ref 135–145)
Total Bilirubin: 0.3 mg/dL (ref 0.3–1.2)
Total Protein: 7.6 g/dL (ref 6.0–8.3)

## 2012-07-04 LAB — LIPID PANEL
Cholesterol: 166 mg/dL (ref 0–200)
HDL: 36 mg/dL — ABNORMAL LOW (ref >39)
LDL Cholesterol: 95 mg/dL (ref 0–99)
Total CHOL/HDL Ratio: 4.6 Ratio
Triglycerides: 176 mg/dL — ABNORMAL HIGH (ref <150)
VLDL: 35 mg/dL (ref 0–40)

## 2012-07-04 LAB — POCT UA - MICROALBUMIN

## 2012-07-04 LAB — POCT GLYCOSYLATED HEMOGLOBIN (HGB A1C): Hemoglobin A1C: 7.1

## 2012-07-04 MED ORDER — METFORMIN HCL 1000 MG PO TABS: 1000.0000 mg | ORAL_TABLET | Freq: Two times a day (BID) | ORAL | Status: DC

## 2012-07-04 NOTE — Progress Notes (Signed)
  Subjective:    Patient ID: Amanda Morris, female    DOB: 1950/10/22, 62 y.o.   MRN: [id number]  HPI DM - No hypoglycemic events. Taking her medicaitons regulraly. No cuts or sores that aren't healing well. Not checking her sugar at home. No polyuria or polydyspsia.    HTN -  Pt denies chest pain, SOB, dizziness, or heart palpitations.  Taking meds as directed w/o problems.  Denies medication side effects.    Hyperlipidemia -tolerating meds well. No SE on myalgias.    GERD - Saw Dr. Marcy Salvo last week. They are planning colonoscopy and told her to inc prilosec to BID.   Review of Systems     Objective:   Physical Exam  Constitutional: She is oriented to person, place, and time. She appears well-developed and well-nourished.  HENT:  Head: Normocephalic and atraumatic.  Cardiovascular: Normal rate, regular rhythm and normal heart sounds.   Pulmonary/Chest: Effort normal and breath sounds normal.  Neurological: She is alert and oriented to person, place, and time.  Skin: Skin is warm and dry.  Psychiatric: She has a normal mood and affect. Her behavior is normal.          Assessment & Plan:  DM- A1C is 7.1 today. Uncontrolled.  Will increase metofrimin to 1000mg  BID.  F/U in 3 months.  Foot exam and urine microalbumin today.  On statin, ASA. Not on ACE but no known kidney dz.   HTN- well controlled.  Continue current regimen.   Hyperlipidemia - Due to recheck lipoids in April. Continue statin. Given labslip today.  Lab Results  Component Value Date   CHOL 148 08/02/2011   HDL 36* 08/02/2011   LDLCALC 82 08/02/2011   TRIG 151* 08/02/2011   CHOLHDL 4.1 08/02/2011   GERD- keep followup with GI. Once her symptoms are well-controlled recommend she wean back on her PPI when she is able. But please follow her gastroneurology instructions on this.

## 2012-07-04 NOTE — Patient Instructions (Addendum)
I am increasing her metformin1000 mg twice a day. Make sure getting regular exercise. Ultimate goal is 30 minutes 5 days per week. You can start with just 10 minutes walking daily. Please check every lab work when you are able.

## 2012-07-06 ENCOUNTER — Ambulatory Visit (INDEPENDENT_AMBULATORY_CARE_PROVIDER_SITE_OTHER): Payer: Medicare Other

## 2012-07-06 DIAGNOSIS — Z1231 Encounter for screening mammogram for malignant neoplasm of breast: Secondary | ICD-10-CM

## 2012-07-26 DIAGNOSIS — Z86010 Personal history of colonic polyps: Secondary | ICD-10-CM | POA: Diagnosis not present

## 2012-07-26 DIAGNOSIS — D649 Anemia, unspecified: Secondary | ICD-10-CM | POA: Diagnosis not present

## 2012-07-26 DIAGNOSIS — K2100 Gastro-esophageal reflux disease with esophagitis, without bleeding: Secondary | ICD-10-CM | POA: Diagnosis not present

## 2012-08-07 ENCOUNTER — Telehealth: Payer: Self-pay | Admitting: *Deleted

## 2012-08-07 MED ORDER — ONDANSETRON HCL 4 MG PO TABS: 4.0000 mg | ORAL_TABLET | Freq: Every day | ORAL | Status: DC | PRN

## 2012-08-07 NOTE — Telephone Encounter (Signed)
Patient calls and states thinks her meds is making her nauseated in the am's. Takes Metformin after she eats. Started about 1-2 weeks ago.

## 2012-08-07 NOTE — Telephone Encounter (Signed)
Pt states that the nausea lasts even after she eats but will let up some if she lays down.  She states that Dr. Tania Ade (?) gave her doxapin for her colonoscopy & that's the only new med she takes.  [facility] [address]

## 2012-08-07 NOTE — Telephone Encounter (Signed)
Yes that is possible that can be a side effect. Cut the morning dose to 1/2 tab in the morning and 1 tab in evening and see if that helps. If still continuing to be a problem can try extended release.

## 2012-08-07 NOTE — Telephone Encounter (Signed)
Morning dose of what? She doesn't think its the Metformin as she is nauseated before she even takes it

## 2012-08-07 NOTE — Telephone Encounter (Signed)
Does the nausea go away after she eats or lasting all day? Has she started any new meds? Could give rx to help with nausea but should not need everyday.

## 2012-08-07 NOTE — Telephone Encounter (Signed)
Sent to pharmacy to use as needed. Side effect of doxapin  And metformin is nausea. We may need to try course of stopping meds to see if helps.

## 2012-08-08 NOTE — Telephone Encounter (Signed)
Pt.notified

## 2012-08-18 ENCOUNTER — Other Ambulatory Visit: Payer: Self-pay | Admitting: Family Medicine

## 2012-08-25 DIAGNOSIS — R198 Other specified symptoms and signs involving the digestive system and abdomen: Secondary | ICD-10-CM | POA: Diagnosis not present

## 2012-08-25 DIAGNOSIS — R109 Unspecified abdominal pain: Secondary | ICD-10-CM | POA: Diagnosis not present

## 2012-08-28 ENCOUNTER — Telehealth: Payer: Self-pay | Admitting: *Deleted

## 2012-08-28 NOTE — Telephone Encounter (Signed)
Pt calls and states that the nausea med you gave is not working and questions if she needs appointment with you or what to do?

## 2012-08-28 NOTE — Telephone Encounter (Signed)
Has she see GI yet.  I thought she was following up with them. I can send over phenergan (if that one works for her, let me know) but needs to get back in with GI.

## 2012-08-29 NOTE — Telephone Encounter (Signed)
Pt informed of MD instructions and will call GI back. Barry Dienes, LPN

## 2012-09-05 DIAGNOSIS — R112 Nausea with vomiting, unspecified: Secondary | ICD-10-CM | POA: Diagnosis not present

## 2012-09-06 ENCOUNTER — Telehealth: Payer: Self-pay | Admitting: Family Medicine

## 2012-09-06 MED ORDER — SAXAGLIPTIN-METFORMIN ER 5-500 MG PO TB24: 1.0000 | ORAL_TABLET | Freq: Every day | ORAL | Status: DC

## 2012-09-06 NOTE — Telephone Encounter (Signed)
Please call patient and let her know that I got a note from her gastroenterologist. They have recommended that we decrease her metformin to see if it actually helps with her nausea. A be happy to do that. In fact if she would like to pick up some sample of a different medication and see if she feels better. We can try Kombiglyze 5/500. One tablet daily in the morning. She can pick up a couple weeks or so that she can try and see what she thinks.

## 2012-09-06 NOTE — Telephone Encounter (Signed)
Pt notified and will try the Kombliglyze 5/500mg . Samples given #14

## 2012-09-07 ENCOUNTER — Other Ambulatory Visit: Payer: Self-pay | Admitting: Family Medicine

## 2012-09-07 NOTE — Telephone Encounter (Signed)
Are these ok to fill- looks like they have been discontinued and hasn't filled in over a year

## 2012-09-08 ENCOUNTER — Other Ambulatory Visit: Payer: Self-pay | Admitting: Family Medicine

## 2012-09-08 DIAGNOSIS — R109 Unspecified abdominal pain: Secondary | ICD-10-CM | POA: Diagnosis not present

## 2012-09-11 DIAGNOSIS — R110 Nausea: Secondary | ICD-10-CM | POA: Diagnosis not present

## 2012-09-30 DIAGNOSIS — Z7982 Long term (current) use of aspirin: Secondary | ICD-10-CM | POA: Diagnosis not present

## 2012-09-30 DIAGNOSIS — I10 Essential (primary) hypertension: Secondary | ICD-10-CM | POA: Diagnosis not present

## 2012-09-30 DIAGNOSIS — Z79899 Other long term (current) drug therapy: Secondary | ICD-10-CM | POA: Diagnosis not present

## 2012-09-30 DIAGNOSIS — M79609 Pain in unspecified limb: Secondary | ICD-10-CM | POA: Diagnosis not present

## 2012-09-30 DIAGNOSIS — T6391XA Toxic effect of contact with unspecified venomous animal, accidental (unintentional), initial encounter: Secondary | ICD-10-CM | POA: Diagnosis not present

## 2012-09-30 DIAGNOSIS — E785 Hyperlipidemia, unspecified: Secondary | ICD-10-CM | POA: Diagnosis not present

## 2012-09-30 DIAGNOSIS — Z885 Allergy status to narcotic agent status: Secondary | ICD-10-CM | POA: Diagnosis not present

## 2012-09-30 DIAGNOSIS — E119 Type 2 diabetes mellitus without complications: Secondary | ICD-10-CM | POA: Diagnosis not present

## 2012-09-30 DIAGNOSIS — Z880 Allergy status to penicillin: Secondary | ICD-10-CM | POA: Diagnosis not present

## 2012-10-04 ENCOUNTER — Other Ambulatory Visit: Payer: Self-pay | Admitting: Family Medicine

## 2012-10-04 ENCOUNTER — Ambulatory Visit: Payer: Medicare Other | Admitting: Family Medicine

## 2012-10-06 DIAGNOSIS — R112 Nausea with vomiting, unspecified: Secondary | ICD-10-CM | POA: Diagnosis not present

## 2012-10-19 DIAGNOSIS — R112 Nausea with vomiting, unspecified: Secondary | ICD-10-CM | POA: Diagnosis not present

## 2012-10-23 DIAGNOSIS — E119 Type 2 diabetes mellitus without complications: Secondary | ICD-10-CM | POA: Diagnosis not present

## 2012-10-23 DIAGNOSIS — M542 Cervicalgia: Secondary | ICD-10-CM | POA: Diagnosis not present

## 2012-10-23 DIAGNOSIS — S139XXA Sprain of joints and ligaments of unspecified parts of neck, initial encounter: Secondary | ICD-10-CM | POA: Diagnosis not present

## 2012-10-23 DIAGNOSIS — IMO0002 Reserved for concepts with insufficient information to code with codable children: Secondary | ICD-10-CM | POA: Diagnosis not present

## 2012-10-23 DIAGNOSIS — S335XXA Sprain of ligaments of lumbar spine, initial encounter: Secondary | ICD-10-CM | POA: Diagnosis not present

## 2012-10-23 DIAGNOSIS — M47817 Spondylosis without myelopathy or radiculopathy, lumbosacral region: Secondary | ICD-10-CM | POA: Diagnosis not present

## 2012-10-23 DIAGNOSIS — I10 Essential (primary) hypertension: Secondary | ICD-10-CM | POA: Diagnosis not present

## 2012-10-23 DIAGNOSIS — Z880 Allergy status to penicillin: Secondary | ICD-10-CM | POA: Diagnosis not present

## 2012-10-23 DIAGNOSIS — Z885 Allergy status to narcotic agent status: Secondary | ICD-10-CM | POA: Diagnosis not present

## 2012-10-25 ENCOUNTER — Encounter: Payer: Self-pay | Admitting: Family Medicine

## 2012-10-25 ENCOUNTER — Ambulatory Visit (INDEPENDENT_AMBULATORY_CARE_PROVIDER_SITE_OTHER): Payer: Medicare Other | Admitting: Family Medicine

## 2012-10-25 VITALS — BP 108/71 | HR 77 | Wt 224.0 lb

## 2012-10-25 DIAGNOSIS — S139XXA Sprain of joints and ligaments of unspecified parts of neck, initial encounter: Secondary | ICD-10-CM

## 2012-10-25 DIAGNOSIS — S134XXA Sprain of ligaments of cervical spine, initial encounter: Secondary | ICD-10-CM

## 2012-10-25 DIAGNOSIS — R112 Nausea with vomiting, unspecified: Secondary | ICD-10-CM | POA: Diagnosis not present

## 2012-10-25 MED ORDER — METAXALONE 800 MG PO TABS: ORAL_TABLET | ORAL | Status: DC
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
## 2012-10-26 ENCOUNTER — Telehealth: Payer: Self-pay | Admitting: *Deleted

## 2012-10-26 DIAGNOSIS — M62838 Other muscle spasm: Secondary | ICD-10-CM

## 2012-10-26 MED ORDER — CYCLOBENZAPRINE HCL 10 MG PO TABS: ORAL_TABLET | ORAL | Status: DC

## 2012-10-26 NOTE — Telephone Encounter (Signed)
Pt.notified

## 2012-10-26 NOTE — Telephone Encounter (Signed)
Substitution of cyclobenzaprine sent to her pharm

## 2012-10-26 NOTE — Telephone Encounter (Signed)
Pt calls and states that the Skelaxin you sent yesterday is too expensive and wants to know if you can send in something generic that is comparable to this?

## 2012-10-31 ENCOUNTER — Other Ambulatory Visit: Payer: Self-pay | Admitting: Family Medicine

## 2012-11-02 ENCOUNTER — Other Ambulatory Visit: Payer: Self-pay

## 2012-12-25 ENCOUNTER — Other Ambulatory Visit: Payer: Self-pay | Admitting: Family Medicine

## 2012-12-26 ENCOUNTER — Other Ambulatory Visit: Payer: Self-pay | Admitting: Family Medicine

## 2013-01-20 DIAGNOSIS — Z90710 Acquired absence of both cervix and uterus: Secondary | ICD-10-CM | POA: Diagnosis not present

## 2013-01-20 DIAGNOSIS — Z9889 Other specified postprocedural states: Secondary | ICD-10-CM | POA: Diagnosis not present

## 2013-01-20 DIAGNOSIS — R109 Unspecified abdominal pain: Secondary | ICD-10-CM | POA: Diagnosis not present

## 2013-01-20 DIAGNOSIS — B354 Tinea corporis: Secondary | ICD-10-CM | POA: Diagnosis not present

## 2013-01-20 DIAGNOSIS — R21 Rash and other nonspecific skin eruption: Secondary | ICD-10-CM | POA: Diagnosis not present

## 2013-01-21 ENCOUNTER — Other Ambulatory Visit: Payer: Self-pay | Admitting: Family Medicine

## 2013-01-22 ENCOUNTER — Other Ambulatory Visit: Payer: Self-pay | Admitting: Family Medicine

## 2013-01-22 DIAGNOSIS — R1110 Vomiting, unspecified: Secondary | ICD-10-CM | POA: Diagnosis not present

## 2013-01-24 ENCOUNTER — Telehealth: Payer: Self-pay | Admitting: *Deleted

## 2013-01-24 NOTE — Telephone Encounter (Signed)
Called pt to inform her that the letter that she is requesting to be written has been denied however the number that we have for her is not correct a gentleman answered and said that there was no one there by that name. I called her EC Tara and lvm asking for a return call or a number that she could be reached by.Loralee Pacas [place]

## 2013-01-25 ENCOUNTER — Encounter: Payer: Medicare Other | Admitting: Family Medicine

## 2013-01-30 ENCOUNTER — Telehealth: Payer: Self-pay | Admitting: *Deleted

## 2013-01-30 ENCOUNTER — Encounter: Payer: Self-pay | Admitting: Family Medicine

## 2013-01-30 ENCOUNTER — Other Ambulatory Visit: Payer: Self-pay | Admitting: Family Medicine

## 2013-01-30 ENCOUNTER — Ambulatory Visit (INDEPENDENT_AMBULATORY_CARE_PROVIDER_SITE_OTHER): Payer: Medicare HMO | Admitting: Family Medicine

## 2013-01-30 VITALS — BP 108/74 | HR 88 | Wt 213.0 lb

## 2013-01-30 DIAGNOSIS — R21 Rash and other nonspecific skin eruption: Secondary | ICD-10-CM

## 2013-01-30 DIAGNOSIS — E119 Type 2 diabetes mellitus without complications: Secondary | ICD-10-CM

## 2013-01-30 DIAGNOSIS — R1084 Generalized abdominal pain: Secondary | ICD-10-CM

## 2013-01-30 DIAGNOSIS — Z0000 Encounter for general adult medical examination without abnormal findings: Secondary | ICD-10-CM

## 2013-01-30 LAB — CBC WITH DIFFERENTIAL/PLATELET
Basophils Absolute: 0.1 10*3/uL (ref 0.0–0.1)
Basophils Relative: 1 % (ref 0–1)
Eosinophils Absolute: 0.2 10*3/uL (ref 0.0–0.7)
Eosinophils Relative: 2 % (ref 0–5)
HCT: 41 % (ref 36.0–46.0)
Hemoglobin: 13 g/dL (ref 12.0–15.0)
Lymphocytes Relative: 42 % (ref 12–46)
Lymphs Abs: 4.6 10*3/uL — ABNORMAL HIGH (ref 0.7–4.0)
MCH: 23.9 pg — ABNORMAL LOW (ref 26.0–34.0)
MCHC: 31.7 g/dL (ref 30.0–36.0)
MCV: 75.3 fL — ABNORMAL LOW (ref 78.0–100.0)
Monocytes Absolute: 0.9 10*3/uL (ref 0.1–1.0)
Monocytes Relative: 8 % (ref 3–12)
Neutro Abs: 5.2 10*3/uL (ref 1.7–7.7)
Neutrophils Relative %: 48 % (ref 43–77)
Platelets: 290 10*3/uL (ref 150–400)
RBC: 5.43 MIL/uL — ABNORMAL HIGH (ref 3.87–5.11)
RDW: 16 % — ABNORMAL HIGH (ref 11.5–15.5)
WBC: 11 10*3/uL — ABNORMAL HIGH (ref 4.0–10.5)

## 2013-01-30 LAB — POCT URINALYSIS DIPSTICK
Bilirubin, UA: NEGATIVE
Blood, UA: NEGATIVE
Glucose, UA: NEGATIVE
Ketones, UA: NEGATIVE
Nitrite, UA: NEGATIVE
Protein, UA: NEGATIVE
Spec Grav, UA: 1.015
Urobilinogen, UA: 1
pH, UA: 6

## 2013-01-30 LAB — COMPLETE METABOLIC PANEL WITH GFR
ALT: 13 U/L (ref 0–35)
AST: 15 U/L (ref 0–37)
Albumin: 4 g/dL (ref 3.5–5.2)
Alkaline Phosphatase: 96 U/L (ref 39–117)
BUN: 8 mg/dL (ref 6–23)
CO2: 25 mEq/L (ref 19–32)
Calcium: 9.3 mg/dL (ref 8.4–10.5)
Chloride: 106 mEq/L (ref 96–112)
Creat: 0.75 mg/dL (ref 0.50–1.10)
GFR, Est African American: >89 mL/min
GFR, Est Non African American: 87 mL/min
Glucose, Bld: 91 mg/dL (ref 70–99)
Potassium: 4.1 mEq/L (ref 3.5–5.3)
Sodium: 140 mEq/L (ref 135–145)
Total Bilirubin: 0.4 mg/dL (ref 0.3–1.2)
Total Protein: 7.3 g/dL (ref 6.0–8.3)

## 2013-01-30 LAB — LIPID PANEL
Cholesterol: 175 mg/dL (ref 0–200)
HDL: 34 mg/dL — ABNORMAL LOW (ref >39)
LDL Cholesterol: 106 mg/dL — ABNORMAL HIGH (ref 0–99)
Total CHOL/HDL Ratio: 5.1 Ratio
Triglycerides: 176 mg/dL — ABNORMAL HIGH (ref <150)
VLDL: 35 mg/dL (ref 0–40)

## 2013-01-30 LAB — POCT GLYCOSYLATED HEMOGLOBIN (HGB A1C): Hemoglobin A1C: 8.1

## 2013-01-30 LAB — AMYLASE: Amylase: 30 U/L (ref 0–105)

## 2013-01-30 LAB — LIPASE: Lipase: 31 U/L (ref 0–75)

## 2013-01-30 MED ORDER — CARVEDILOL 12.5 MG PO TABS: ORAL_TABLET | ORAL | Status: DC

## 2013-01-30 MED ORDER — AMBULATORY NON FORMULARY MEDICATION: Status: DC

## 2013-01-30 NOTE — Telephone Encounter (Signed)
Pt left vm stating that you were going to send in an rx for "muscle cramps".  I see flexaril on her med list.  Ok to refill this or was there a dif med you guys discussed? Please advise

## 2013-01-30 NOTE — Progress Notes (Signed)
Subjective:    Amanda Morris is a 62 y.o. female who presents for Medicare Annual/Subsequent preventive examination.  Preventive Screening-Counseling & Management  Tobacco History  Smoking status  . Former Smoker  . Types: Cigarettes  . Quit date: 07/30/2007  Smokeless tobacco  . Not on file     Problems Prior to Visit 1. 2 months of lower abd pain. Has been vomiting in the morning. Usually vomits x 1 day. No abnormal vag d/c. Says the pain gets worse during the day. Eating doesn't help or make it worse. When vomits in aM it is on an empy stomach.  Still has GB.  Never had pancreatitis. Does have DM that is not well controlled.  Hs been more constipated over the last 2 months   She also has a rash on the left lower abdomen that she says is sore and tender. She says [facility] and [facility] for her abdominal pain as well as the rash. She was given oral antifungal. She says she took the medication and it did not improve the rash at all. She has not been using anything over-the-counter.  Current Problems (verified) Patient Active Problem List   Diagnosis Date Noted  . Type II or unspecified type diabetes mellitus without mention of complication, not stated as uncontrolled 05/05/2011  . SVT (supraventricular tachycardia) 11/03/2010  . Hypertension 10/29/2010  . GERD (gastroesophageal reflux disease) 10/29/2010  . Chronic low back pain 10/29/2010  . Other and unspecified hyperlipidemia 10/29/2010    Medications Prior to Visit Current Outpatient Prescriptions on File Prior to Visit  Medication Sig Dispense Refill  . aspirin 81 MG tablet Take 81 mg by mouth daily.        . carvedilol (COREG) 12.5 MG tablet Take 1 tablet (12.5 mg total) by mouth 2 (two) times daily with a meal.  180 tablet  2  . carvedilol (COREG) 12.5 MG tablet TAKE 1 TABLET (12.5 MG TOTAL) BY MOUTH 2 (TWO) TIMES DAILY WITH A MEAL.  60 tablet  2  . cyclobenzaprine (FLEXERIL) 10 MG  tablet Take a half to a full tab every 8-12 hours only as needed for muscle spasm, may cause sedation.  45 tablet  0  . fluticasone (FLONASE) 50 MCG/ACT nasal spray INHALE 2 SPRAYS IN EACH NOSTRIL EACH DAY  16 g  1  . glipiZIDE (GLUCOTROL) 10 MG tablet Take 1 tablet (10 mg total) by mouth 2 (two) times daily before a meal.  180 tablet  2  . glipiZIDE (GLUCOTROL) 10 MG tablet TAKE 1 TABLET (10 MG TOTAL) BY MOUTH 2 (TWO) TIMES DAILY BEFORE A MEAL.  60 tablet  2  . omeprazole (PRILOSEC) 20 MG capsule Take 20 mg by mouth 2 (two) times daily.       . ondansetron (ZOFRAN) 4 MG tablet Take 1 tablet (4 mg total) by mouth daily as needed for nausea.  30 tablet  1  . pravastatin (PRAVACHOL) 20 MG tablet TAKE 1 TABLET (20 MG TOTAL) BY MOUTH DAILY.  30 tablet  4   No current facility-administered medications on file prior to visit.    Current Medications (verified) Current Outpatient Prescriptions  Medication Sig Dispense Refill  . aspirin 81 MG tablet Take 81 mg by mouth daily.        . carvedilol (COREG) 12.5 MG tablet Take 1 tablet (12.5 mg total) by mouth 2 (two) times daily with a meal.  180 tablet  2  . carvedilol (COREG)  12.5 MG tablet TAKE 1 TABLET (12.5 MG TOTAL) BY MOUTH 2 (TWO) TIMES DAILY WITH A MEAL.  60 tablet  2  . cyclobenzaprine (FLEXERIL) 10 MG tablet Take a half to a full tab every 8-12 hours only as needed for muscle spasm, may cause sedation.  45 tablet  0  . fluticasone (FLONASE) 50 MCG/ACT nasal spray INHALE 2 SPRAYS IN EACH NOSTRIL EACH DAY  16 g  1  . glipiZIDE (GLUCOTROL) 10 MG tablet Take 1 tablet (10 mg total) by mouth 2 (two) times daily before a meal.  180 tablet  2  . glipiZIDE (GLUCOTROL) 10 MG tablet TAKE 1 TABLET (10 MG TOTAL) BY MOUTH 2 (TWO) TIMES DAILY BEFORE A MEAL.  60 tablet  2  . omeprazole (PRILOSEC) 20 MG capsule Take 20 mg by mouth 2 (two) times daily.       . ondansetron (ZOFRAN) 4 MG tablet Take 1 tablet (4 mg total) by mouth daily as needed for nausea.  30  tablet  1  . pravastatin (PRAVACHOL) 20 MG tablet TAKE 1 TABLET (20 MG TOTAL) BY MOUTH DAILY.  30 tablet  4   No current facility-administered medications for this visit.     Allergies (verified) Penicillins   PAST HISTORY  Family History Family History  Problem Relation Age of Onset  . Heart disease Mother   . Diabetes Mother   . Hypertension Mother   . Stroke Mother   . Heart disease Father   . Cancer Sister   . Heart disease Sister   . Diabetes Sister   . Hyperlipidemia Sister   . Hypertension Sister   . Stroke Sister   . Alcohol abuse Brother   . Hyperlipidemia Brother     Social History History  Substance Use Topics  . Smoking status: Former Smoker    Types: Cigarettes    Quit date: 07/30/2007  . Smokeless tobacco: Not on file  . Alcohol Use: No     Are there smokers in your home (other than you)? No  Risk Factors Current exercise habits: walking  Dietary issues discussed: None   Cardiac risk factors: diabetes mellitus, dyslipidemia, obesity (BMI >= 30 kg/m2) and sedentary lifestyle.  Depression Screen (Note: if answer to either of the following is "Yes", a more complete depression screening is indicated)   Over the past two weeks, have you felt down, depressed or hopeless? No  Over the past two weeks, have you felt little interest or pleasure in doing things? Yes  Have you lost interest or pleasure in daily life? Yes  Do you often feel hopeless? No  Do you cry easily over simple problems? No  Activities of Daily Living In your present state of health, do you have any difficulty performing the following activities?:  Driving? No Managing money?  No Feeding yourself? No Getting from bed to chair? No Climbing a flight of stairs? Yes Preparing food and eating?: No Bathing or showering? No Getting dressed: No Getting to the toilet? No Using the toilet:No Moving around from place to place: No In the past year have you fallen or had a near  fall?:No   Are you sexually active?  No  Do you have more than one partner?  No  Hearing Difficulties: No Do you often ask people to speak up or repeat themselves? No Do you experience ringing or noises in your ears? Yes Do you have difficulty understanding soft or whispered voices? No   Do you feel that you  have a problem with memory? No  Do you often misplace items? No  Do you feel safe at home?  No  Cognitive Testing  Alert? Yes  Normal Appearance?Yes  Oriented to person? Yes  Place? Yes   Time? Yes`  Recall of three objects?  Yes  Can perform simple calculations? Yes  Displays appropriate judgment?Yes  Can read the correct time from a watch face?Yes   Advanced Directives have been discussed with the patient? No  List the Names of Other Physician/Practitioners you currently use: 1.    Indicate any recent Medical Services you may have received from other than [facility] providers in the past year (date may be approximate).  Immunization History  Administered Date(s) Administered  . Influenza Split 03/02/2011, 02/25/2012  . Pneumococcal Polysaccharide 05/12/2005  . Tdap 04/27/2007    Screening Tests Health Maintenance  Topic Date Due  . Pneumococcal Polysaccharide Vaccine (#2) 05/12/2010  . Ophthalmology Exam  07/25/2012  . Zostavax  10/13/2012  . Influenza Vaccine  11/24/2012  . Hemoglobin A1c  01/04/2013  . Foot Exam  07/04/2013  . Urine Microalbumin  07/04/2013  . Mammogram  07/07/2014  . Tetanus/tdap  04/26/2017  . Colonoscopy  07/26/2017    All answers were reviewed with the patient and necessary referrals were made:  Latanja Lehenbauer, MD   01/30/2013   History reviewed: allergies, current medications, past family history, past medical history, past social history, past surgical history and problem list  Review of Systems Pertinent items are noted in HPI.    Objective:     Vision by Snellen chart: right eye: She plans to schedule eye appointment since  she is diabetic.  There is no weight on file to calculate BMI. There were no vitals taken for this visit.  BP 108/74  Pulse 88  Wt 213 lb (96.616 kg)  BMI 36.54 kg/m2  General Appearance:    Alert, cooperative, no distress, appears stated age  Head:    Normocephalic, without obvious abnormality, atraumatic  Eyes:    PERRL, conjunctiva/corneas clear, EOM's intact, both eyes  Ears:    Normal TM's and external ear canals, both ears  Nose:   Nares normal, septum midline, mucosa normal, no drainage    or sinus tenderness  Throat:   Lips, mucosa, and tongue normal; teeth and gums normal  Neck:   Supple, symmetrical, trachea midline, no adenopathy;    thyroid:  no enlargement/tenderness/nodules; no carotid   bruit or JVD  Back:     Symmetric, no curvature, ROM normal, no CVA tenderness  Lungs:     Clear to auscultation bilaterally, respirations unlabored  Chest Wall:    No tenderness or deformity   Heart:    Regular rate and rhythm, S1 and S2 normal, no murmur, rub   or gallop  Breast Exam:    Not performed.   Abdomen:     Soft but diffusely tender, bowel sounds active all four quadrants,    no masses, no organomegaly  Genitalia:    Not pefromed.   Rectal:    Not peformed.   Extremities:   Extremities normal, atraumatic, no cyanosis or edema  Pulses:   2+ and symmetric all extremities  Skin:   Skin color, texture, turgor normal, + rash over the left groin increased. Approximately 4 x 7 cm. Its overall and erythematous and shiny. No significant scale. No satellite lesions.   Lymph nodes:   Cervical, supraclavicular, and axillary nodes normal  Neurologic:   CNII-XII intact, normal strength,  sensation and reflexes    throughout       Assessment:     Annual medicare Wellness.      Plan:     During the course of the visit the patient was educated and counseled about appropriate screening and preventive services including:    Influenza vaccine - given.   Generalized abdominal  pain-this is very concerning. She's been vomiting almost daily for 2 months and is very tender on exam. Will get a stat CBC with differential. It sounds like she has started been evaluated by [facility] [place]. Infectious her to schedule for some testing tomorrow. Hopefully we will get records of this. Certainly this could be related to her diabetes. Could be gastroparesis the I do think her symptoms are abrupt.  Rash-didn't respond to oral antifungal, which I suspect was Diflucan. The skin scraping performed today we'll send for KOH. Call with results once available.  Need for shingles vaccine. Rx given to get done at the pharmacy.  Hypertension-explained she cannot be on metoprolol and carvedilol. She says she feels better on the carvedilol so we will send refills on this.  DM- needs to schedule f/u appt for this in 2-3 mo.   Diet review for nutrition referral? Yes ____  Not Indicated __x_   Patient Instructions (the written plan) was given to the patient.  Medicare Attestation I have personally reviewed: The patient's medical and social history Their use of alcohol, tobacco or illicit drugs Their current medications and supplements The patient's functional ability including ADLs,fall risks, home safety risks, cognitive, and hearing and visual impairment Diet and physical activities Evidence for depression or mood disorders  The patient's weight, height, BMI, and visual acuity have been recorded in the chart.  I have made referrals, counseling, and provided education to the patient based on review of the above and I have provided the patient with a written personalized care plan for preventive services.     Kaion Tisdale, MD   01/30/2013

## 2013-01-31 ENCOUNTER — Other Ambulatory Visit: Payer: Self-pay | Admitting: Family Medicine

## 2013-01-31 ENCOUNTER — Telehealth: Payer: Self-pay | Admitting: Family Medicine

## 2013-01-31 DIAGNOSIS — R109 Unspecified abdominal pain: Secondary | ICD-10-CM | POA: Diagnosis not present

## 2013-01-31 LAB — KOH PREP: RESULT - KOH: NONE SEEN

## 2013-01-31 LAB — VITAMIN B12: Vitamin B-12: 382 pg/mL (ref 211–911)

## 2013-01-31 LAB — FERRITIN: Ferritin: 43 ng/mL (ref 10–291)

## 2013-01-31 LAB — FOLATE: Folate: >20 ng/mL

## 2013-01-31 MED ORDER — BACLOFEN 10 MG PO TABS: 10.0000 mg | ORAL_TABLET | Freq: Every day | ORAL | Status: DC | PRN

## 2013-01-31 MED ORDER — TRIAMCINOLONE ACETONIDE 0.5 % EX OINT: TOPICAL_OINTMENT | Freq: Two times a day (BID) | CUTANEOUS | Status: DC

## 2013-01-31 NOTE — Telephone Encounter (Signed)
Called pt and she has taken metformin before and it made her feel bad.Loralee Pacas [place]

## 2013-01-31 NOTE — Telephone Encounter (Signed)
Please call patient and see if she's able to take a drug called metformin. We used it very commonly for diabetes. If she has taken before and was able to tolerate it then I think we need to add it to her glipizide for better control of her diabetes. Right now her sugars are not looking good. If she does not tolerate it then please add to her intolerance list and let me know so that we can add something different.

## 2013-01-31 NOTE — Telephone Encounter (Signed)
Pt notified of rx & MD recommendations. 

## 2013-01-31 NOTE — Telephone Encounter (Signed)
Tell her sorry. We did talk abou trying baclofen. I will send her a prescription and she can try instead of the Flexeril. Also recommend taking over-the-counter magnesium daily. As this is been shown to help with muscle cramping as well.

## 2013-02-11 ENCOUNTER — Other Ambulatory Visit: Payer: Self-pay | Admitting: Family Medicine

## 2013-02-27 ENCOUNTER — Ambulatory Visit: Payer: Medicare Other | Admitting: Family Medicine

## 2013-03-07 ENCOUNTER — Ambulatory Visit (INDEPENDENT_AMBULATORY_CARE_PROVIDER_SITE_OTHER): Payer: Medicare HMO | Admitting: Family Medicine

## 2013-03-07 ENCOUNTER — Encounter: Payer: Self-pay | Admitting: Family Medicine

## 2013-03-07 VITALS — BP 112/70 | HR 67 | Wt 211.0 lb

## 2013-03-07 DIAGNOSIS — R42 Dizziness and giddiness: Secondary | ICD-10-CM

## 2013-03-07 DIAGNOSIS — I10 Essential (primary) hypertension: Secondary | ICD-10-CM

## 2013-03-07 DIAGNOSIS — E119 Type 2 diabetes mellitus without complications: Secondary | ICD-10-CM

## 2013-03-07 MED ORDER — CARVEDILOL 6.25 MG PO TABS: 6.2500 mg | ORAL_TABLET | Freq: Two times a day (BID) | ORAL | Status: DC

## 2013-03-07 MED ORDER — CANAGLIFLOZIN 100 MG PO TABS: 1.0000 | ORAL_TABLET | Freq: Every day | ORAL | Status: DC

## 2013-03-07 NOTE — Progress Notes (Signed)
Subjective:    Patient ID: Amanda Morris, female    DOB: Jul 17, 1950, 62 y.o.   MRN: [id number]  HPI HTN - intermittantly dizzy for months.  She describes it as a room spinning sensation. She has never passed out or lost consciousness. No known triggers or cause. Position doesn't seem to be matter. Can happen at rest or with activity. Her zoster and time of the day it seems to happen or certain time after taking her blood pressure pills. Usually lasts a few seconds.  Feels like the room is spiining. No ear pain or pressure. No fevers or upper respiratory infection. She has noted that her blood pressures have been running a little bit lower at home. They have been running in the teens. She's is the highest he has seen his 132 systolic. She does not have a home blood pressure cuff but often checks it at the pharmacy. I will pressures have been running a little lower when she comes here as well.  Diabetes-she tried going up on her metformin to 1000 mg but started having nausea vomiting and diarrhea on it. She did tolerate the 500 mg well but would like to try something else. No recent hypoglycemic events. Her last A1c was 8.1 on October 7. She is taking her glipizide regularly.   Review of Systems  BP 112/70  Pulse 67  Wt 211 lb (95.709 kg)  SpO2 97%    Allergies  Allergen Reactions  . Metformin And Related Other (See Comments)    Nausea and diarrhea.   Marland Kitchen Penicillins Hives    Past Medical History  Diagnosis Date  . Allergy   . Palpitations     Past Surgical History  Procedure Laterality Date  . Abdominal hysterectomy  1982  . Shoulder surgery  06/2008, 09/2010    Right, by Dr. Anthoney Harada, then Dr. Bayard Beaver    History   Social History  . Marital Status: Single    Spouse Name: N/A    Number of Children: 1  . Years of Education: 10th grade   Occupational History  . Disabled.     Social History Main Topics  . Smoking status: Former Smoker    Types: Cigarettes    Quit date:  07/30/2007  . Smokeless tobacco: Not on file  . Alcohol Use: No  . Drug Use: No  . Sexual Activity: No   Other Topics Concern  . Not on file   Social History Narrative   Caffeine intake. Some regular exercise.    Family History  Problem Relation Age of Onset  . Heart disease Mother   . Diabetes Mother   . Hypertension Mother   . Stroke Mother   . Heart disease Father   . Heart disease Sister   . Diabetes Sister   . Hyperlipidemia Sister   . Hypertension Sister   . Stroke Sister   . Alcohol abuse Brother   . Hyperlipidemia Brother     Outpatient Encounter Prescriptions as of 03/07/2013  Medication Sig  . AMBULATORY NON FORMULARY MEDICATION Medication Name: Shingles vaccine IM x 1  . aspirin 81 MG tablet Take 81 mg by mouth daily.    . baclofen (LIORESAL) 10 MG tablet Take 1 tablet (10 mg total) by mouth daily as needed.  . carvedilol (COREG) 6.25 MG tablet Take 1 tablet (6.25 mg total) by mouth 2 (two) times daily with a meal. TAKE 1 TABLET (12.5 MG TOTAL) BY MOUTH 2 (TWO) TIMES DAILY WITH A MEAL.  Marland Kitchen dexlansoprazole (  DEXILANT) 60 MG capsule Take 60 mg by mouth daily.  . fluticasone (FLONASE) 50 MCG/ACT nasal spray INHALE 2 SPRAYS IN EACH NOSTRIL EACH DAY  . glipiZIDE (GLUCOTROL) 10 MG tablet TAKE 1 TABLET (10 MG TOTAL) BY MOUTH 2 (TWO) TIMES DAILY BEFORE A MEAL.  Marland Kitchen omeprazole (PRILOSEC) 20 MG capsule Take 20 mg by mouth 2 (two) times daily.   Marland Kitchen perphenazine (TRILAFON) 8 MG tablet Take 8 mg by mouth 3 (three) times daily.  . pravastatin (PRAVACHOL) 20 MG tablet TAKE 1 TABLET (20 MG TOTAL) BY MOUTH DAILY.  Marland Kitchen prochlorperazine (COMPAZINE) 10 MG tablet Take 10 mg by mouth every 6 (six) hours as needed.  . [DISCONTINUED] carvedilol (COREG) 12.5 MG tablet TAKE 1 TABLET (12.5 MG TOTAL) BY MOUTH 2 (TWO) TIMES DAILY WITH A MEAL.  . Canagliflozin 100 MG TABS Take 1 tablet (100 mg total) by mouth daily.  . cyclobenzaprine (FLEXERIL) 10 MG tablet Take a half to a full tab every 8-12  hours only as needed for muscle spasm, may cause sedation.  . triamcinolone ointment (KENALOG) 0.5 % Apply topically 2 (two) times daily.  . [DISCONTINUED] ondansetron (ZOFRAN) 4 MG tablet Take 1 tablet (4 mg total) by mouth daily as needed for nausea.          Objective:   Physical Exam  Constitutional: She is oriented to person, place, and time. She appears well-developed and well-nourished.  HENT:  Head: Normocephalic and atraumatic.  Right Ear: External ear normal.  Left Ear: External ear normal.  Nose: Nose normal.  Mouth/Throat: Oropharynx is clear and moist.  TMs and canals are clear.   Eyes: Conjunctivae and EOM are normal. Pupils are equal, round, and reactive to light.  Neck: Neck supple. No thyromegaly present.  Cardiovascular: Normal rate, regular rhythm and normal heart sounds.   Pulmonary/Chest: Effort normal and breath sounds normal. She has no wheezes.  Lymphadenopathy:    She has no cervical adenopathy.  Neurological: She is alert and oriented to person, place, and time. No cranial nerve deficit.  Negative Dix-Hallpike maneuver.  Skin: Skin is warm and dry.  Psychiatric: She has a normal mood and affect. Her behavior is normal.          Assessment & Plan:  HTN- well controlled. In fact it sounds like a lot of her blood pressures at home have been low and she's been feeling dizzy. We will decrease her Cardizem to 6.25 mg twice a day and see her back in 2 months to see if she's feeling better. We'll have to monitor for increased pulse and heart rate or palpitations and she also has SVT and we are decreasing her Cardizem which does rate control.  Dizziness - most likely related to lower blood pressures. She had negative Dix-Hallpike maneuver ear exam is normal today. We will see if decreasing her medication in the next couple months makes a difference. If not then please let me know and we'll do an additional workup.   DM - uncontrolled. Will try adding Invokana,  we will try for 2 months. Samples given coupon card given. Call if not tolerating well or having any side effects. Did warn about increased urination and increased risk for urinary tract infections and yeast infections. She is on a statin, aspirin. Not on an ACE inhibitor R. I do not want to at this time since her blood pressures actually running a little bit low. Urine microalbumin performed today.

## 2013-03-08 ENCOUNTER — Telehealth: Payer: Self-pay | Admitting: *Deleted

## 2013-03-08 NOTE — Telephone Encounter (Signed)
Okay to refill omeprazole. Have not heard of the Invokana causing reflux. I can certainly ask the drug rep when they come in next time. It's not listed under common adverse events.

## 2013-03-08 NOTE — Telephone Encounter (Signed)
Pt left message stating that the diabetes med you put her on is causing her acid reflux to flare up really bad.  I see omeprazole on her med list; you ok with me refilling this? Please advise.

## 2013-03-09 ENCOUNTER — Other Ambulatory Visit: Payer: Self-pay | Admitting: *Deleted

## 2013-03-09 MED ORDER — OMEPRAZOLE 20 MG PO CPDR: 20.0000 mg | DELAYED_RELEASE_CAPSULE | Freq: Two times a day (BID) | ORAL | Status: DC

## 2013-03-09 NOTE — Telephone Encounter (Signed)
Pt notified rx sent.

## 2013-04-23 DIAGNOSIS — R110 Nausea: Secondary | ICD-10-CM | POA: Diagnosis not present

## 2013-05-02 ENCOUNTER — Ambulatory Visit: Payer: Medicare HMO | Admitting: Family Medicine

## 2013-05-04 ENCOUNTER — Encounter: Payer: Self-pay | Admitting: Family Medicine

## 2013-05-04 ENCOUNTER — Ambulatory Visit (INDEPENDENT_AMBULATORY_CARE_PROVIDER_SITE_OTHER): Payer: Medicare Other | Admitting: Family Medicine

## 2013-05-04 VITALS — BP 104/68 | HR 85 | Temp 98.0°F | Ht 64.0 in | Wt 205.0 lb

## 2013-05-04 DIAGNOSIS — E119 Type 2 diabetes mellitus without complications: Secondary | ICD-10-CM

## 2013-05-04 DIAGNOSIS — Z8669 Personal history of other diseases of the nervous system and sense organs: Secondary | ICD-10-CM

## 2013-05-04 DIAGNOSIS — I10 Essential (primary) hypertension: Secondary | ICD-10-CM

## 2013-05-04 DIAGNOSIS — R42 Dizziness and giddiness: Secondary | ICD-10-CM

## 2013-05-04 LAB — POCT URINALYSIS DIPSTICK
Bilirubin, UA: NEGATIVE
Blood, UA: NEGATIVE
Glucose, UA: >=1000
Ketones, UA: NEGATIVE
Leukocytes, UA: NEGATIVE
Nitrite, UA: NEGATIVE
Protein, UA: NEGATIVE
Spec Grav, UA: 1.015
Urobilinogen, UA: 1
pH, UA: 6.5

## 2013-05-04 LAB — POCT GLYCOSYLATED HEMOGLOBIN (HGB A1C): Hemoglobin A1C: 6.8
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
## 2013-05-17 DIAGNOSIS — I69998 Other sequelae following unspecified cerebrovascular disease: Secondary | ICD-10-CM | POA: Diagnosis not present

## 2013-05-17 DIAGNOSIS — R269 Unspecified abnormalities of gait and mobility: Secondary | ICD-10-CM | POA: Diagnosis not present

## 2013-05-17 DIAGNOSIS — R42 Dizziness and giddiness: Secondary | ICD-10-CM | POA: Diagnosis not present

## 2013-05-17 DIAGNOSIS — R55 Syncope and collapse: Secondary | ICD-10-CM | POA: Diagnosis not present

## 2013-05-17 DIAGNOSIS — R209 Unspecified disturbances of skin sensation: Secondary | ICD-10-CM | POA: Diagnosis not present

## 2013-05-28 ENCOUNTER — Telehealth: Payer: Self-pay | Admitting: *Deleted

## 2013-05-28 DIAGNOSIS — G609 Hereditary and idiopathic neuropathy, unspecified: Secondary | ICD-10-CM | POA: Diagnosis not present

## 2013-05-28 DIAGNOSIS — R42 Dizziness and giddiness: Secondary | ICD-10-CM | POA: Diagnosis not present

## 2013-05-28 DIAGNOSIS — I69998 Other sequelae following unspecified cerebrovascular disease: Secondary | ICD-10-CM | POA: Diagnosis not present

## 2013-05-28 DIAGNOSIS — R55 Syncope and collapse: Secondary | ICD-10-CM | POA: Diagnosis not present

## 2013-05-28 DIAGNOSIS — R209 Unspecified disturbances of skin sensation: Secondary | ICD-10-CM | POA: Diagnosis not present

## 2013-05-28 NOTE — Telephone Encounter (Signed)
Pt called to give bp reading 126/79 p79 .Amanda Morris [place]

## 2013-05-28 NOTE — Telephone Encounter (Signed)
Called pt lvm informing her that this was fantastic and to cont to monitor.Amanda Morris

## 2013-05-28 NOTE — Telephone Encounter (Signed)
That is a fantastic blood pressure. We'll continue to monitor.

## 2013-05-29 ENCOUNTER — Telehealth: Payer: Self-pay | Admitting: *Deleted

## 2013-05-29 NOTE — Telephone Encounter (Signed)
Continue to hold the medication for now and followup with me in 2-3 weeks. It sounds like her blood pressures still are well within the normal without the medication.

## 2013-05-29 NOTE — Telephone Encounter (Signed)
Pt called Monday stating that she has stopped the Coreg for 2 weeks and that her BP was 104/61. She also stated that she was going to an appt with another doctor and that she would call back to her BP from there but she has not. She was asking what you would like for her to do as far as her Coreg.  Oscar La, LPN

## 2013-05-29 NOTE — Telephone Encounter (Signed)
Pt informed.  Misty Ahmad, LPN  

## 2013-06-01 DIAGNOSIS — G609 Hereditary and idiopathic neuropathy, unspecified: Secondary | ICD-10-CM | POA: Diagnosis not present

## 2013-06-01 DIAGNOSIS — I6529 Occlusion and stenosis of unspecified carotid artery: Secondary | ICD-10-CM | POA: Diagnosis not present

## 2013-06-01 DIAGNOSIS — R42 Dizziness and giddiness: Secondary | ICD-10-CM | POA: Diagnosis not present

## 2013-06-01 DIAGNOSIS — R9401 Abnormal electroencephalogram [EEG]: Secondary | ICD-10-CM | POA: Diagnosis not present

## 2013-06-01 DIAGNOSIS — I69998 Other sequelae following unspecified cerebrovascular disease: Secondary | ICD-10-CM | POA: Diagnosis not present

## 2013-06-01 DIAGNOSIS — R55 Syncope and collapse: Secondary | ICD-10-CM | POA: Diagnosis not present

## 2013-06-07 ENCOUNTER — Encounter: Payer: Self-pay | Admitting: Family Medicine

## 2013-06-07 DIAGNOSIS — G609 Hereditary and idiopathic neuropathy, unspecified: Secondary | ICD-10-CM | POA: Insufficient documentation

## 2013-06-07 DIAGNOSIS — I6529 Occlusion and stenosis of unspecified carotid artery: Secondary | ICD-10-CM | POA: Insufficient documentation

## 2013-06-13 ENCOUNTER — Other Ambulatory Visit: Payer: Self-pay | Admitting: Physician Assistant

## 2013-06-13 MED ORDER — DAPAGLIFLOZIN PROPANEDIOL 10 MG PO TABS: 10.0000 mg | ORAL_TABLET | Freq: Every day | ORAL | Status: DC

## 2013-06-13 NOTE — Progress Notes (Signed)
Insurance will not pay for invokana. I sent over farxiga once daily that is very similar to try. Please give pt coupon card for her to activate before picking up rx.

## 2013-06-13 NOTE — Progress Notes (Signed)
Pt notified & card place up front.

## 2013-06-14 NOTE — Progress Notes (Signed)
Pt called and I informed her of the change to her med and to come by the ofc to p/u the coupon card. She stated that she will try to get by today.Amanda Morris [place]

## 2013-06-25 DIAGNOSIS — G609 Hereditary and idiopathic neuropathy, unspecified: Secondary | ICD-10-CM | POA: Diagnosis not present

## 2013-06-25 DIAGNOSIS — G40209 Localization-related (focal) (partial) symptomatic epilepsy and epileptic syndromes with complex partial seizures, not intractable, without status epilepticus: Secondary | ICD-10-CM | POA: Diagnosis not present

## 2013-06-25 DIAGNOSIS — R55 Syncope and collapse: Secondary | ICD-10-CM | POA: Diagnosis not present

## 2013-06-29 ENCOUNTER — Ambulatory Visit (INDEPENDENT_AMBULATORY_CARE_PROVIDER_SITE_OTHER): Payer: Medicare Other | Admitting: Family Medicine

## 2013-06-29 ENCOUNTER — Encounter: Payer: Self-pay | Admitting: Family Medicine

## 2013-06-29 VITALS — BP 128/79 | HR 113 | Temp 98.2°F | Wt 203.0 lb

## 2013-06-29 DIAGNOSIS — E119 Type 2 diabetes mellitus without complications: Secondary | ICD-10-CM | POA: Diagnosis not present

## 2013-06-29 DIAGNOSIS — I10 Essential (primary) hypertension: Secondary | ICD-10-CM

## 2013-06-29 DIAGNOSIS — E785 Hyperlipidemia, unspecified: Secondary | ICD-10-CM

## 2013-06-29 DIAGNOSIS — N760 Acute vaginitis: Secondary | ICD-10-CM

## 2013-06-29 LAB — POCT UA - MICROALBUMIN
Albumin/Creatinine Ratio, Urine, POC: <30
Creatinine, POC: 50 mg/dL
Microalbumin Ur, POC: 10 mg/L

## 2013-06-29 MED ORDER — FLUCONAZOLE 150 MG PO TABS: 150.0000 mg | ORAL_TABLET | Freq: Once | ORAL | Status: DC

## 2013-06-29 NOTE — Progress Notes (Signed)
   Subjective:    Patient ID: Amanda Morris, female    DOB: [birth year], 63 y.o.   MRN: [id number]  HPI Diabetes - no hypoglycemic events. No wounds or sores that are not healing well. No increased thirst or urination. Checking glucose at home. Taking medications as prescribed without any side effects, except she feels like she may have a yeast infection today. This could be a potential side effect from the [place]. She tolerated it well up till now..  She also give me an update that her dizzy spells were actually caused by seizures. She has been seeing Amanda Morris, neurology. He did start her on antiseizure medication but she doesn't remember the name of it. She says she will call us back next week and let us know what it is. So far she's tolerating it well and has not had any side effects on it.   Review of Systems     Objective:   Physical Exam  Constitutional: She is oriented to person, place, and time. She appears well-developed and well-nourished.  HENT:  Head: Normocephalic and atraumatic.  Cardiovascular: Normal rate, regular rhythm and normal heart sounds.   Pulmonary/Chest: Effort normal and breath sounds normal.  Neurological: She is alert and oriented to person, place, and time.  Skin: Skin is warm and dry.  Psychiatric: She has a normal mood and affect. Her behavior is normal.          Assessment & Plan:  Diabetes-she's due for eye exam. Urine micro been performed today. She's also due for CMP and fasting lipid panel. Lab slip think her sugar in the next few weeks. She's not quite due for her A1c. Followup in 8 weeks will check at that time. Otherwise doing well on the Invokana. Lab Results  Component Value Date   HGBA1C 6.8 05/04/2013   Vaginitis-possible yeast vaginitis. Wet prep collected. Since it is Friday afternoon I will go ahead and call in prescription for Diflucan. We'll call her with the results of the wet prep if it changes the care plan.  Seizure disorder  unspecified-will call to get most up-to-date notes from Amanda Morris office. I have the notes from February where an initial EKG was abnormal but do not have the updated sleep deprived EKG. She is currently allowed to drive but is on medication.

## 2013-06-30 LAB — WET PREP, GENITAL
Clue Cells Wet Prep HPF POC: NONE SEEN
Trich, Wet Prep: NONE SEEN
Yeast Wet Prep HPF POC: NONE SEEN

## 2013-07-03 ENCOUNTER — Encounter: Payer: Self-pay | Admitting: *Deleted

## 2013-07-03 ENCOUNTER — Telehealth: Payer: Self-pay | Admitting: *Deleted

## 2013-07-03 NOTE — Telephone Encounter (Signed)
Ok, lets just add to med list

## 2013-07-03 NOTE — Telephone Encounter (Signed)
Pt called with the medication that Dr. Berdine Addison started her on Lamotrigine 25 mg 1st wk 1 tab, 2nd wk 1 tab BID 3rd wk 2 tabs BID, 2 tabs AM 3 tabs PM..Audelia Hives [place]

## 2013-07-05 ENCOUNTER — Encounter: Payer: Self-pay | Admitting: Family Medicine

## 2013-07-05 DIAGNOSIS — E785 Hyperlipidemia, unspecified: Secondary | ICD-10-CM | POA: Diagnosis not present

## 2013-07-05 DIAGNOSIS — I10 Essential (primary) hypertension: Secondary | ICD-10-CM | POA: Diagnosis not present

## 2013-07-06 LAB — COMPLETE METABOLIC PANEL WITH GFR
ALT: 19 U/L (ref 0–35)
AST: 18 U/L (ref 0–37)
Albumin: 3.7 g/dL (ref 3.5–5.2)
Alkaline Phosphatase: 103 U/L (ref 39–117)
BUN: 12 mg/dL (ref 6–23)
CO2: 27 mEq/L (ref 19–32)
Calcium: 8.6 mg/dL (ref 8.4–10.5)
Chloride: 105 mEq/L (ref 96–112)
Creat: 0.75 mg/dL (ref 0.50–1.10)
GFR, Est African American: >89 mL/min
GFR, Est Non African American: 87 mL/min
Glucose, Bld: 89 mg/dL (ref 70–99)
Potassium: 4.1 mEq/L (ref 3.5–5.3)
Sodium: 141 mEq/L (ref 135–145)
Total Bilirubin: 0.3 mg/dL (ref 0.2–1.2)
Total Protein: 6.8 g/dL (ref 6.0–8.3)

## 2013-07-06 LAB — LIPID PANEL
Cholesterol: 153 mg/dL (ref 0–200)
HDL: 39 mg/dL — ABNORMAL LOW (ref >39)
LDL Cholesterol: 91 mg/dL (ref 0–99)
Total CHOL/HDL Ratio: 3.9 Ratio
Triglycerides: 113 mg/dL (ref <150)
VLDL: 23 mg/dL (ref 0–40)

## 2013-07-10 ENCOUNTER — Other Ambulatory Visit: Payer: Self-pay | Admitting: Family Medicine

## 2013-07-14 ENCOUNTER — Other Ambulatory Visit: Payer: Self-pay | Admitting: Family Medicine

## 2013-07-16 ENCOUNTER — Other Ambulatory Visit: Payer: Self-pay | Admitting: Family Medicine

## 2013-07-17 ENCOUNTER — Other Ambulatory Visit: Payer: Self-pay | Admitting: Physician Assistant

## 2013-07-17 ENCOUNTER — Telehealth: Payer: Self-pay | Admitting: *Deleted

## 2013-07-17 NOTE — Telephone Encounter (Signed)
Pt called and stated that she will need a note for work for her hours to be cut down to 36 hours per week. She stated that this is due to her muscle weakness.  I looked back in her chart and informed her that Dr. Madilyn Fireman has not seen her for this and cannot write this type of letter for her. I asked if she has seen anyone else for this and she stated that she had just had surgery. I told her to contact the surgeons ofc and have them to write this for her . She voiced understanding and agreed.Amanda Morris [place]

## 2013-07-19 DIAGNOSIS — M25519 Pain in unspecified shoulder: Secondary | ICD-10-CM | POA: Diagnosis not present

## 2013-07-23 ENCOUNTER — Telehealth: Payer: Self-pay | Admitting: *Deleted

## 2013-07-23 MED ORDER — SITAGLIPTIN PHOSPHATE 100 MG PO TABS: 100.0000 mg | ORAL_TABLET | Freq: Every day | ORAL | Status: DC

## 2013-07-23 NOTE — Telephone Encounter (Signed)
She can call her insurance and find out altneratives.

## 2013-07-23 NOTE — Telephone Encounter (Signed)
Pt called and said that her insurance will not cover farxiga she will need alternative. This is too expensive.Amanda Morris

## 2013-07-23 NOTE — Telephone Encounter (Signed)
Pt informed.Amanda Morris [place]  

## 2013-07-23 NOTE — Addendum Note (Signed)
Addended by: Beatrice Lecher D on: 07/23/2013 05:09 PM   Modules accepted: Orders, Medications

## 2013-07-23 NOTE — Telephone Encounter (Signed)
Pt informed to call her insurance company to find out alternative med choices

## 2013-07-23 NOTE — Telephone Encounter (Signed)
Okay, will send over a prescription for Januvia.

## 2013-07-23 NOTE — Telephone Encounter (Signed)
Pt called back with the following meds that her insurance will pay for: jaunuvia, jaument, pioglitazone .Amanda Morris

## 2013-08-02 ENCOUNTER — Ambulatory Visit (INDEPENDENT_AMBULATORY_CARE_PROVIDER_SITE_OTHER): Payer: Medicare Other | Admitting: Family Medicine

## 2013-08-02 ENCOUNTER — Encounter: Payer: Self-pay | Admitting: Family Medicine

## 2013-08-02 VITALS — BP 109/72 | HR 91 | Wt 202.0 lb

## 2013-08-02 DIAGNOSIS — E119 Type 2 diabetes mellitus without complications: Secondary | ICD-10-CM | POA: Diagnosis not present

## 2013-08-02 DIAGNOSIS — R42 Dizziness and giddiness: Secondary | ICD-10-CM

## 2013-08-02 DIAGNOSIS — E785 Hyperlipidemia, unspecified: Secondary | ICD-10-CM

## 2013-08-02 LAB — POCT GLYCOSYLATED HEMOGLOBIN (HGB A1C): Hemoglobin A1C: 6.6

## 2013-08-02 MED ORDER — PRAVASTATIN SODIUM 40 MG PO TABS: 40.0000 mg | ORAL_TABLET | Freq: Every day | ORAL | Status: DC

## 2013-08-02 NOTE — Patient Instructions (Signed)
Remember to get your eye exam.

## 2013-08-02 NOTE — Progress Notes (Signed)
   Subjective:    Patient ID: Amanda Morris, female    DOB: Sep 22, 1950, 63 y.o.   MRN: [id number]  HPI Diabetes - no hypoglycemic events. No wounds or sores that are not healing well. No increased thirst or urination. Checking glucose at home. Taking medications as prescribed without any side effects.  Dizziness-she still having a lot of problems with this. She did see her neurologist about 4 weeks ago and they started Lamictal. She's been tolerating it well so far. She's not sure if it's helping or not but is not sure she's been on it long enough to tell. Her next followup with him is in about 3 months.  Hyperlipidemia-tolerating her statin well without side effects or problems.  Review of Systems     Objective:   Physical Exam  Constitutional: She is oriented to person, place, and time. She appears well-developed and well-nourished.  HENT:  Head: Normocephalic and atraumatic.  Cardiovascular: Normal rate, regular rhythm and normal heart sounds.   Pulmonary/Chest: Effort normal and breath sounds normal.  Neurological: She is alert and oriented to person, place, and time.  Skin: Skin is warm and dry.  Psychiatric: She has a normal mood and affect. Her behavior is normal.          Assessment & Plan:  Diabetes-well-controlled. He A1c is 6.6 today which is fantastic. On a statin and aspirin. Continue current regimen. Followup in 3 months.  Dizziness-followed by neurology now on Lamictal.  Hyperlipidemia-discussed with her that need, and recommend a moderate dose intensity statin. Would like to increase her pravastatin from 20 mg to 40 mg. Given new prescriptions today. Call if having any problems or doesn't feel like she's tolerating it well. We can recheck her liver and lipids at her followup visit in 3 months.

## 2013-08-10 ENCOUNTER — Other Ambulatory Visit: Payer: Self-pay | Admitting: *Deleted

## 2013-08-10 MED ORDER — GLIPIZIDE 10 MG PO TABS: ORAL_TABLET | ORAL | Status: DC

## 2013-08-10 MED ORDER — PRAVASTATIN SODIUM 40 MG PO TABS: 40.0000 mg | ORAL_TABLET | Freq: Every day | ORAL | Status: DC

## 2013-08-13 ENCOUNTER — Other Ambulatory Visit: Payer: Self-pay | Admitting: *Deleted

## 2013-08-14 ENCOUNTER — Ambulatory Visit: Payer: Medicare Other | Admitting: Family Medicine

## 2013-10-18 ENCOUNTER — Other Ambulatory Visit: Payer: Self-pay | Admitting: Family Medicine

## 2013-10-30 ENCOUNTER — Other Ambulatory Visit: Payer: Self-pay | Admitting: Family Medicine

## 2013-11-01 ENCOUNTER — Telehealth: Payer: Self-pay | Admitting: Family Medicine

## 2013-11-01 ENCOUNTER — Encounter: Payer: Self-pay | Admitting: Family Medicine

## 2013-11-01 ENCOUNTER — Ambulatory Visit (INDEPENDENT_AMBULATORY_CARE_PROVIDER_SITE_OTHER): Payer: Medicare Other | Admitting: Family Medicine

## 2013-11-01 VITALS — BP 131/91 | HR 92 | Wt 211.0 lb

## 2013-11-01 DIAGNOSIS — E119 Type 2 diabetes mellitus without complications: Secondary | ICD-10-CM

## 2013-11-01 DIAGNOSIS — E669 Obesity, unspecified: Secondary | ICD-10-CM

## 2013-11-01 DIAGNOSIS — Z79899 Other long term (current) drug therapy: Secondary | ICD-10-CM | POA: Diagnosis not present

## 2013-11-01 DIAGNOSIS — R030 Elevated blood-pressure reading, without diagnosis of hypertension: Secondary | ICD-10-CM | POA: Diagnosis not present

## 2013-11-01 DIAGNOSIS — IMO0001 Reserved for inherently not codable concepts without codable children: Secondary | ICD-10-CM

## 2013-11-01 LAB — POCT GLYCOSYLATED HEMOGLOBIN (HGB A1C): Hemoglobin A1C: 6.5

## 2013-11-01 MED ORDER — FLUTICASONE PROPIONATE 50 MCG/ACT NA SUSP: NASAL | Status: DC

## 2013-11-01 NOTE — Telephone Encounter (Signed)
Please call patient and let her note like her to come back in for a blood pressure check in 3-4 weeks. I still her to keep her followup for diabetes in 3 months but we need to make sure that her blood pressures coming down. Continue to watch salt in the diet.

## 2013-11-01 NOTE — Progress Notes (Signed)
   Subjective:    Patient ID: Amanda Morris, female    DOB: Oct 18, 1950, 63 y.o.   MRN: [id number]  HPI Diabetes - no hypoglycemic events. No wounds or sores that are not healing well. No increased thirst or urination. Checking glucose at home. Taking medications as prescribed without any side effects. Has gained 9 lbs. she says she's been snacking more than usual.   BP- Pt denies chest pain, SOB, dizziness, or heart palpitations.  Taking meds as directed w/o problems.  Denies medication side effects.    Also here for medication review today. She has several duplicates with different doses is not sure what she should actually be taking. She thinks it might actually be causing some numbness in her hands and feet. She would like some assistance with this. She is getting medications from 3 different providers including knee. Review of Systems     Objective:   Physical Exam  Constitutional: She is oriented to person, place, and time. She appears well-developed and well-nourished.  HENT:  Head: Normocephalic and atraumatic.  Cardiovascular: Normal rate, regular rhythm and normal heart sounds.   Pulmonary/Chest: Effort normal and breath sounds normal.  Neurological: She is alert and oriented to person, place, and time.  Skin: Skin is warm and dry.  Psychiatric: She has a normal mood and affect. Her behavior is normal.          Assessment & Plan:  DM-  Well controlled.  Continue current regimen her A1c is up a little bit from last time. Recommend continue work on diet and exercise and work on losing the 9 pounds that she has gained in the last few months. She is on a statin and aspirin. She is not on ACE inhibitor but has no renal disease. Lab Results  Component Value Date   HGBA1C 6.5 11/01/2013    Reminded to get eye exam today.  Elevated BP - we discussed the importance of really controlling blood pressure by avoiding excess salt. She's been eating a lot of ham recently. Her blood  pressure previously look fantastic. We does seem to get back on track with diet. I asked her followup in about a months and we can recheck her blood pressure and make sure that it's well-controlled.  Obesity - has gained 9 lbs.  again discussed the importance of diet and exercise and working on losing the 9 pounds she has gained.  Medication management-we went through all of her medications today. She had duplicates for her cholesterol pill as well as her perphenazine. We call Dr. weeks, her GI doctor just to make sure that she was taking the correct medication as she did have different doses and duplicates. We discarded her omeprazole since she is now on dexilant. We also contacted her pharmacy to have them removed any extra refills on the medications that she is no longer taking so that she does not accidentally refill.

## 2013-11-01 NOTE — Patient Instructions (Addendum)
Reminded to ger her eye exam.   With the pravastatin. Take 2 of the 20mg  tabs for one week. Then change to the 40mg  daily at bedtime.

## 2013-11-01 NOTE — Telephone Encounter (Signed)
Pt informed appt made for 7/30.Amanda Morris

## 2013-11-02 ENCOUNTER — Other Ambulatory Visit: Payer: Self-pay | Admitting: Family Medicine

## 2013-11-12 ENCOUNTER — Ambulatory Visit (INDEPENDENT_AMBULATORY_CARE_PROVIDER_SITE_OTHER): Payer: Medicare Other | Admitting: Family Medicine

## 2013-11-12 ENCOUNTER — Encounter: Payer: Self-pay | Admitting: Family Medicine

## 2013-11-12 VITALS — BP 128/92 | HR 105 | Wt 209.0 lb

## 2013-11-12 DIAGNOSIS — T148 Other injury of unspecified body region: Secondary | ICD-10-CM

## 2013-11-12 DIAGNOSIS — L02419 Cutaneous abscess of limb, unspecified: Secondary | ICD-10-CM | POA: Diagnosis not present

## 2013-11-12 DIAGNOSIS — L03119 Cellulitis of unspecified part of limb: Secondary | ICD-10-CM | POA: Diagnosis not present

## 2013-11-12 DIAGNOSIS — W57XXXA Bitten or stung by nonvenomous insect and other nonvenomous arthropods, initial encounter: Secondary | ICD-10-CM | POA: Diagnosis not present

## 2013-11-12 DIAGNOSIS — L03115 Cellulitis of right lower limb: Secondary | ICD-10-CM

## 2013-11-12 MED ORDER — TRIAMCINOLONE ACETONIDE 0.5 % EX OINT: 1.0000 "application " | TOPICAL_OINTMENT | Freq: Every day | CUTANEOUS | Status: DC

## 2013-11-12 MED ORDER — DOXYCYCLINE HYCLATE 100 MG PO TABS: 100.0000 mg | ORAL_TABLET | Freq: Two times a day (BID) | ORAL | Status: DC

## 2013-11-12 NOTE — Progress Notes (Signed)
   Subjective:    Patient ID: Amanda Morris, female    DOB: 12-03-1950, 63 y.o.   MRN: [id number]  HPI Insect Bite - unsure of what bit her she was bitten on L forearm 1 wk ago, and on the top of her L foot and lower R leg yesterday. Not seen any ticks.  She is not currently doing any kind of treatment except for applying alcohol. It has not really helped. She says they're very itchy. No drainage or active infection. No fever chills or sweats. She does have cats.    Review of Systems     Objective:   Physical Exam  Constitutional: She appears well-developed and well-nourished.  HENT:  Head: Normocephalic and atraumatic.  Skin: Skin is warm and dry.  She has an erythematous nodular type lesion on the right lower extremity. Fairly indurated. No open wounds or drainage. The erythema is approximately 4 cm across. She also has a similar lesion on her left forearm but a little bit smaller. 3 cm in size. She has some erythema with smaller lesions on the top of her left foot that these are 1-2 cm in size.  Psychiatric: She has a normal mood and affect. Her behavior is normal.          Assessment & Plan:  Insect bites. Unclear what exactly is causing it. We discussed that it could be mosquitoes versus fleas. It doesn't really look with a spider bite. I am concerned that the one on her right inner lower leg is actually becoming infected. I'm going to go ahead and place her on antibiotic and send her a prescription for topical steroid cream to help with the itching and redness that she's less likely to scratch. Discussed the importance of not scratching and using cool compresses when needed to help soothe the itch. Also discussed applying insect repellent if she's going to be on the early morning and late evenings.

## 2013-11-12 NOTE — Patient Instructions (Signed)

## 2013-11-22 ENCOUNTER — Ambulatory Visit (INDEPENDENT_AMBULATORY_CARE_PROVIDER_SITE_OTHER): Payer: Medicare Other | Admitting: Family Medicine

## 2013-11-22 VITALS — BP 116/84 | HR 86 | Temp 98.1°F | Ht 64.0 in | Wt 208.0 lb

## 2013-11-22 DIAGNOSIS — R030 Elevated blood-pressure reading, without diagnosis of hypertension: Secondary | ICD-10-CM

## 2013-11-22 DIAGNOSIS — IMO0001 Reserved for inherently not codable concepts without codable children: Secondary | ICD-10-CM

## 2013-11-22 NOTE — Progress Notes (Signed)
   Subjective:    Patient ID: Amanda Morris, female    DOB: 18-May-1950, 63 y.o.   MRN: [id number]  HPI    Review of Systems     Objective:   Physical Exam        Assessment & Plan:  Agree with below. Patient was seen today because of elevated blood pressure about 2 weeks ago. Looks fantastic today. Continue current regimen. Beatrice Lecher, MD

## 2013-11-22 NOTE — Progress Notes (Signed)
   Subjective:    Patient ID: Amanda Morris, female    DOB: 09/24/1950, 63 y.o.   MRN: [id number]  HPI    Review of Systems     Objective:   Physical Exam        Assessment & Plan:  Mrs. Vey came in today for her blood pressure check. It is down today. Pt denies chest pain, headaches, mood changes or palpitations. I did tell the pt to continue to watch her salt intake. And reminded her of her appointment with Dr. Madilyn Fireman on October 9 but I told pt that if she needed anything before then just to call and if she needed her blood pressure checked again before her appointment time to call and i will be gladly to check it./Maeson Lourenco,CMA

## 2013-11-27 ENCOUNTER — Telehealth: Payer: Self-pay | Admitting: *Deleted

## 2013-11-27 NOTE — Telephone Encounter (Signed)
Pt stated that she has been having episodes of vomiting after she eats since Friday or Saturday. I asked if she was able to stay hydrated she stated that she has tried to keep fluids down but this has also been difficult. I asked if she has tried bland foods like crackers, toast, or broth she stated that she has and has not been successful with those either. She stated that she has had a Hx of this happening to her a few months ago. Asked if she has been checking her BS's at home and she has not. She denies any headaches, visual changes, or abdominal pain. Only nausea. Pt does not have glucose meter to check BS. Please advise.Audelia Hives [place]

## 2013-11-27 NOTE — Telephone Encounter (Signed)
It would be wise to have her come in for an office visit for both diagnostic and therapeutic considerations.

## 2013-11-28 NOTE — Telephone Encounter (Signed)
Pt informed of recommendations she stated that she could not come in due to her car being in the shop for repairs and she doesn't have any way of getting here. She stated that she was able to eat dinner last night which was some fish and she also had some crackers. I told her to stay as hydrated as she possibly could by eating ice chips if she couldn't drink . Also told her that if her sxs got worse that she would need to get to the ED immediately pt voiced understanding and agreed. appt made for 8/13.Amanda Morris

## 2013-12-04 ENCOUNTER — Encounter: Payer: Self-pay | Admitting: Family Medicine

## 2013-12-04 ENCOUNTER — Ambulatory Visit (INDEPENDENT_AMBULATORY_CARE_PROVIDER_SITE_OTHER): Payer: Medicare Other

## 2013-12-04 ENCOUNTER — Ambulatory Visit (INDEPENDENT_AMBULATORY_CARE_PROVIDER_SITE_OTHER): Payer: Medicare Other | Admitting: Family Medicine

## 2013-12-04 VITALS — BP 121/80 | HR 82 | Temp 97.7°F | Ht 64.0 in | Wt 210.0 lb

## 2013-12-04 DIAGNOSIS — E119 Type 2 diabetes mellitus without complications: Secondary | ICD-10-CM

## 2013-12-04 DIAGNOSIS — R1012 Left upper quadrant pain: Secondary | ICD-10-CM

## 2013-12-04 DIAGNOSIS — R112 Nausea with vomiting, unspecified: Secondary | ICD-10-CM | POA: Diagnosis not present

## 2013-12-04 DIAGNOSIS — R1013 Epigastric pain: Secondary | ICD-10-CM

## 2013-12-04 DIAGNOSIS — R229 Localized swelling, mass and lump, unspecified: Secondary | ICD-10-CM

## 2013-12-04 LAB — GLUCOSE, POCT (MANUAL RESULT ENTRY): POC Glucose: 234 mg/dl — AB (ref 70–99)

## 2013-12-04 LAB — POCT URINALYSIS DIPSTICK
Bilirubin, UA: NEGATIVE
Blood, UA: NEGATIVE
Glucose, UA: NEGATIVE
Ketones, UA: NEGATIVE
Nitrite, UA: NEGATIVE
Protein, UA: NEGATIVE
Spec Grav, UA: 1.015
Urobilinogen, UA: 2
pH, UA: 7.5

## 2013-12-04 NOTE — Progress Notes (Signed)
Subjective:    Patient ID: Amanda Morris, female    DOB: 1950/08/28, 63 y.o.   MRN: [id number]  HPI pt called last wk and stated that she was having episodes of n/v after eating she reports that she is feeling. She hasn't really been able to check her sugar.  Last a1c is 6.5.  Has vomited almost every day.  She does have multiple bug bites on her arms, legs and her Left facial cheeck.  Has been using bug spray. Says they are itchey.  Dr. Suella Grove Rx her Kirby. Not sure what dose she takes or if has been changed recently.  No fever, or chills or sweats.   Review of Systems  BP 121/80  Pulse 82  Temp(Src) 97.7 F (36.5 C)  Ht 5\' 4"  (1.626 m)  Wt 210 lb (95.255 kg)  BMI 36.03 kg/m2    Allergies  Allergen Reactions  . Metformin And Related Other (See Comments)    Nausea and diarrhea.   Marland Kitchen Penicillins Hives    Past Medical History  Diagnosis Date  . Allergy   . Palpitations     Past Surgical History  Procedure Laterality Date  . Abdominal hysterectomy  1982  . Shoulder surgery  06/2008, 09/2010    Right, by Dr. Karie Soda, then Dr. Judeth Horn    History   Social History  . Marital Status: Single    Spouse Name: N/A    Number of Children: 1  . Years of Education: 10th grade   Occupational History  . Disabled.     Social History Main Topics  . Smoking status: Former Smoker    Types: Cigarettes    Quit date: 07/30/2007  . Smokeless tobacco: Not on file  . Alcohol Use: No  . Drug Use: No  . Sexual Activity: No   Other Topics Concern  . Not on file   Social History Narrative   Caffeine intake. Some regular exercise.    Family History  Problem Relation Age of Onset  . Heart disease Mother   . Diabetes Mother   . Hypertension Mother   . Stroke Mother   . Heart disease Father   . Heart disease Sister   . Diabetes Sister   . Hyperlipidemia Sister   . Hypertension Sister   . Stroke Sister   . Alcohol abuse Brother   . Hyperlipidemia Brother      Outpatient Encounter Prescriptions as of 12/04/2013  Medication Sig  . acetaZOLAMIDE (DIAMOX) 250 MG tablet Take 250 mg by mouth daily.  . AMBULATORY NON FORMULARY MEDICATION Medication Name: Shingles vaccine IM x 1  . aspirin 81 MG tablet Take 81 mg by mouth daily.    Marland Kitchen dexlansoprazole (DEXILANT) 60 MG capsule Take 60 mg by mouth daily.  . fluticasone (FLONASE) 50 MCG/ACT nasal spray INHALE 2 SPRAYS IN EACH NOSTRIL EACH DAY  . glipiZIDE (GLUCOTROL) 10 MG tablet TAKE 1 TABLET (10 MG TOTAL) BY MOUTH 2 (TWO) TIMES DAILY BEFORE A MEAL.  Marland Kitchen JANUVIA 100 MG tablet TAKE 1 TABLET (100 MG TOTAL) BY MOUTH DAILY.  Marland Kitchen lamoTRIgine (LAMICTAL) 25 MG tablet Take 25 mg by mouth. 2 tabs in the morning, 3 tabs in the evening  . perphenazine (TRILAFON) 8 MG tablet Take 2 mg by mouth 3 (three) times daily.   . pravastatin (PRAVACHOL) 40 MG tablet Take 1 tablet (40 mg total) by mouth daily.  Marland Kitchen triamcinolone ointment (KENALOG) 0.5 % Apply 1 application topically daily.  . [DISCONTINUED] doxycycline (VIBRA-TABS) 100  MG tablet Take 1 tablet (100 mg total) by mouth 2 (two) times daily.          Objective:   Physical Exam  Constitutional: She is oriented to person, place, and time. She appears well-developed and well-nourished.  HENT:  Head: Normocephalic and atraumatic.  Cardiovascular: Normal rate, regular rhythm and normal heart sounds.   Pulmonary/Chest: Effort normal and breath sounds normal.  Abdominal: Soft. Bowel sounds are normal. She exhibits no distension and no mass. There is tenderness. There is no rebound and no guarding.  +TTP in the epigastri area, suprapubic area and th LUQ. No organomegaly.  Neurological: She is alert and oriented to person, place, and time.  Skin: Skin is warm and dry.  Erythematous nodules most are 1-1.5cm in size.  Few scattered on her right arm and one on left upper arm and few scattered on her legs and on her left facial cheek.    Psychiatric: She has a normal mood and  affect. Her behavior is normal.          Assessment & Plan:  Nausea/vomiting-seems to be postprandial. Will evaluate for gallbladder disease. We'll also evaluate for pancreatitis and she is tender in the left upper quadrant on exam today. Her glucose is also elevated and she has not eaten since 7:30 this morning. Normally she is very well-controlled diabetic.  Erythematous nodules-I'm not so sure that these are insect bites. They are mildly tender and itchy. And she has more than even though she has been using bug spray and has not seen any actual insect biting her. I would like to check a sedimentation rate and a CBC with differential. Also consider could be a rash secondary to her lumbar she. She says she's not sure if her dose has been recently adjusted or not but she has been on it for a while.  suprabupic tenderness - will check a UA.

## 2013-12-05 ENCOUNTER — Other Ambulatory Visit: Payer: Self-pay | Admitting: Family Medicine

## 2013-12-05 LAB — COMPLETE METABOLIC PANEL WITH GFR
ALT: 9 U/L (ref 0–35)
AST: 13 U/L (ref 0–37)
Albumin: 3.7 g/dL (ref 3.5–5.2)
Alkaline Phosphatase: 77 U/L (ref 39–117)
BUN: 11 mg/dL (ref 6–23)
CO2: 23 mEq/L (ref 19–32)
Calcium: 8.1 mg/dL — ABNORMAL LOW (ref 8.4–10.5)
Chloride: 112 mEq/L (ref 96–112)
Creat: 0.89 mg/dL (ref 0.50–1.10)
GFR, Est African American: 81 mL/min
GFR, Est Non African American: 70 mL/min
Glucose, Bld: 138 mg/dL — ABNORMAL HIGH (ref 70–99)
Potassium: 3.4 mEq/L — ABNORMAL LOW (ref 3.5–5.3)
Sodium: 143 mEq/L (ref 135–145)
Total Bilirubin: 0.3 mg/dL (ref 0.2–1.2)
Total Protein: 6.1 g/dL (ref 6.0–8.3)

## 2013-12-05 LAB — CBC WITH DIFFERENTIAL/PLATELET
Basophils Absolute: 0 10*3/uL (ref 0.0–0.1)
Basophils Relative: 0 % (ref 0–1)
Eosinophils Absolute: 0.2 10*3/uL (ref 0.0–0.7)
Eosinophils Relative: 3 % (ref 0–5)
HCT: 38.7 % (ref 36.0–46.0)
Hemoglobin: 12 g/dL (ref 12.0–15.0)
Lymphocytes Relative: 32 % (ref 12–46)
Lymphs Abs: 2.4 10*3/uL (ref 0.7–4.0)
MCH: 24.2 pg — ABNORMAL LOW (ref 26.0–34.0)
MCHC: 31 g/dL (ref 30.0–36.0)
MCV: 78 fL (ref 78.0–100.0)
Monocytes Absolute: 0.8 10*3/uL (ref 0.1–1.0)
Monocytes Relative: 11 % (ref 3–12)
Neutro Abs: 4.1 10*3/uL (ref 1.7–7.7)
Neutrophils Relative %: 54 % (ref 43–77)
Platelets: 309 10*3/uL (ref 150–400)
RBC: 4.96 MIL/uL (ref 3.87–5.11)
RDW: 16.2 % — ABNORMAL HIGH (ref 11.5–15.5)
WBC: 7.6 10*3/uL (ref 4.0–10.5)

## 2013-12-05 LAB — AMYLASE: Amylase: 29 U/L (ref 0–105)

## 2013-12-05 LAB — LIPASE: Lipase: 31 U/L (ref 0–75)

## 2013-12-05 LAB — SEDIMENTATION RATE: Sed Rate: 6 mm/hr (ref 0–22)

## 2013-12-05 MED ORDER — PROMETHAZINE HCL 25 MG PO TABS: 25.0000 mg | ORAL_TABLET | Freq: Three times a day (TID) | ORAL | Status: DC | PRN

## 2013-12-06 ENCOUNTER — Ambulatory Visit: Payer: Medicare Other | Admitting: Family Medicine

## 2013-12-07 ENCOUNTER — Other Ambulatory Visit: Payer: Self-pay | Admitting: *Deleted

## 2013-12-13 DIAGNOSIS — L738 Other specified follicular disorders: Secondary | ICD-10-CM | POA: Diagnosis not present

## 2013-12-13 DIAGNOSIS — L678 Other hair color and hair shaft abnormalities: Secondary | ICD-10-CM | POA: Diagnosis not present

## 2013-12-17 ENCOUNTER — Other Ambulatory Visit: Payer: Self-pay | Admitting: Family Medicine

## 2013-12-18 DIAGNOSIS — L259 Unspecified contact dermatitis, unspecified cause: Secondary | ICD-10-CM | POA: Diagnosis not present

## 2013-12-18 DIAGNOSIS — IMO0001 Reserved for inherently not codable concepts without codable children: Secondary | ICD-10-CM | POA: Diagnosis not present

## 2013-12-25 DIAGNOSIS — R112 Nausea with vomiting, unspecified: Secondary | ICD-10-CM | POA: Diagnosis not present

## 2013-12-25 DIAGNOSIS — R634 Abnormal weight loss: Secondary | ICD-10-CM | POA: Diagnosis not present

## 2013-12-25 DIAGNOSIS — IMO0001 Reserved for inherently not codable concepts without codable children: Secondary | ICD-10-CM | POA: Diagnosis not present

## 2013-12-25 DIAGNOSIS — R1084 Generalized abdominal pain: Secondary | ICD-10-CM | POA: Diagnosis not present

## 2013-12-25 DIAGNOSIS — R109 Unspecified abdominal pain: Secondary | ICD-10-CM | POA: Diagnosis not present

## 2013-12-25 LAB — HEPATIC FUNCTION PANEL
ALT: 8 U/L (ref 7–35)
AST: 15 U/L (ref 13–35)

## 2013-12-25 LAB — CBC AND DIFFERENTIAL
HCT: 42 % (ref 36–46)
Hemoglobin: 12.5 g/dL (ref 12.0–16.0)
Platelets: 308 10*3/uL (ref 150–399)
WBC: 7.2 10^3/mL

## 2013-12-25 LAB — POCT ERYTHROCYTE SEDIMENTATION RATE, NON-AUTOMATED: Sed Rate: 14 mm

## 2013-12-25 LAB — HEMOGLOBIN A1C: Hgb A1c MFr Bld: 6.6 % — AB (ref 4.0–6.0)

## 2013-12-25 LAB — BASIC METABOLIC PANEL
Creatinine: 0.6 mg/dL (ref 0.5–1.1)
Glucose: 111 mg/dL

## 2013-12-25 LAB — TSH: TSH: 1.27 u[IU]/mL (ref 0.41–5.90)

## 2013-12-27 LAB — HEMOGLOBIN A1C: Hgb A1c MFr Bld: 6.6 % — AB (ref 4.0–6.0)

## 2014-01-01 ENCOUNTER — Other Ambulatory Visit: Payer: Self-pay

## 2014-01-01 ENCOUNTER — Other Ambulatory Visit: Payer: Self-pay | Admitting: Family Medicine

## 2014-01-01 NOTE — Telephone Encounter (Signed)
Patient called and states she is seeing a GI, Dr Margaret Pyle, and he prescribed medication for her nausea and vomiting. She would like a refill on the phenergan. I advised patient I would need to clear it first with Dr Madilyn Fireman. She said never mind she didn't want to have to go through so much trouble and she hung up.

## 2014-01-07 ENCOUNTER — Encounter: Payer: Self-pay | Admitting: Family Medicine

## 2014-01-08 ENCOUNTER — Encounter: Payer: Self-pay | Admitting: Family Medicine

## 2014-01-15 DIAGNOSIS — Z1231 Encounter for screening mammogram for malignant neoplasm of breast: Secondary | ICD-10-CM | POA: Diagnosis not present

## 2014-01-15 DIAGNOSIS — R197 Diarrhea, unspecified: Secondary | ICD-10-CM | POA: Diagnosis not present

## 2014-01-15 DIAGNOSIS — R112 Nausea with vomiting, unspecified: Secondary | ICD-10-CM | POA: Diagnosis not present

## 2014-01-15 DIAGNOSIS — R1084 Generalized abdominal pain: Secondary | ICD-10-CM | POA: Diagnosis not present

## 2014-01-16 DIAGNOSIS — R109 Unspecified abdominal pain: Secondary | ICD-10-CM | POA: Diagnosis not present

## 2014-01-24 ENCOUNTER — Other Ambulatory Visit: Payer: Self-pay | Admitting: Family Medicine

## 2014-01-29 ENCOUNTER — Encounter: Payer: Self-pay | Admitting: Family Medicine

## 2014-02-01 ENCOUNTER — Ambulatory Visit: Payer: Medicare Other | Admitting: Family Medicine

## 2014-02-04 ENCOUNTER — Encounter: Payer: Self-pay | Admitting: Family Medicine

## 2014-02-04 ENCOUNTER — Ambulatory Visit (INDEPENDENT_AMBULATORY_CARE_PROVIDER_SITE_OTHER): Payer: Medicare Other | Admitting: Family Medicine

## 2014-02-04 VITALS — BP 143/86 | HR 77 | Ht 64.0 in | Wt 215.0 lb

## 2014-02-04 DIAGNOSIS — R1110 Vomiting, unspecified: Secondary | ICD-10-CM | POA: Diagnosis not present

## 2014-02-04 DIAGNOSIS — R21 Rash and other nonspecific skin eruption: Secondary | ICD-10-CM | POA: Diagnosis not present

## 2014-02-04 DIAGNOSIS — R112 Nausea with vomiting, unspecified: Secondary | ICD-10-CM

## 2014-02-04 MED ORDER — SITAGLIPTIN PHOSPHATE 100 MG PO TABS: 100.0000 mg | ORAL_TABLET | Freq: Every day | ORAL | Status: DC

## 2014-02-04 MED ORDER — FLUTICASONE PROPIONATE 50 MCG/ACT NA SUSP: NASAL | Status: DC

## 2014-02-04 NOTE — Progress Notes (Signed)
   Subjective:    Patient ID: Amanda Morris, female    DOB: 1950/05/23, 63 y.o.   MRN: [id number]  Diabetes   F/U N/V- she has had a prior history of postprandial vomiting. She also has reflux. She has been taking Exelon.  We performed an ultrasound to rule out acute gallbladder disease and it was negative. We also did blood work to rule out pancreatitis and she was tender in the left upper quadrant when I last saw her in August. A very well-controlled diabetic.  She is also on perphen-amitryptiline TID by Dr. Harmon Pier weeks, GI.   Rash - he did see dermatology. He felt like a rash was most consistent with a folliculitis.   Review of Systems     Objective:   Physical Exam  Constitutional: She appears well-developed and well-nourished.  Abdominal: Bowel sounds are normal. She exhibits no distension and no mass. There is tenderness. There is no rebound and no guarding.  TTP in the LUQ and over the epigastrum.    Skin: Skin is warm.  Psychiatric: She has a normal mood and affect. Her behavior is normal.          Assessment & Plan:  N/V- Better. Though a little tenderness on exam but overall much improved on her new regimen.  Rash - healing well.  She's not had any new lesions which is reassuring. This likely folliculitis/bites.  Recommend followup for diabetes in December.

## 2014-04-01 ENCOUNTER — Ambulatory Visit: Payer: Medicare Other | Admitting: Family Medicine

## 2014-04-24 ENCOUNTER — Other Ambulatory Visit: Payer: Self-pay | Admitting: Family Medicine

## 2014-05-01 ENCOUNTER — Other Ambulatory Visit: Payer: Self-pay | Admitting: Family Medicine

## 2014-05-02 ENCOUNTER — Other Ambulatory Visit: Payer: Self-pay | Admitting: Family Medicine

## 2014-05-07 ENCOUNTER — Other Ambulatory Visit: Payer: Self-pay | Admitting: Family Medicine

## 2014-05-08 ENCOUNTER — Other Ambulatory Visit: Payer: Self-pay

## 2014-05-08 MED ORDER — GLIPIZIDE 10 MG PO TABS: 10.0000 mg | ORAL_TABLET | Freq: Two times a day (BID) | ORAL | Status: DC

## 2014-05-16 ENCOUNTER — Ambulatory Visit (INDEPENDENT_AMBULATORY_CARE_PROVIDER_SITE_OTHER): Payer: Medicare Other | Admitting: Family Medicine

## 2014-05-16 ENCOUNTER — Encounter: Payer: Self-pay | Admitting: Family Medicine

## 2014-05-16 VITALS — BP 117/80 | HR 97 | Ht 64.0 in | Wt 223.0 lb

## 2014-05-16 DIAGNOSIS — E119 Type 2 diabetes mellitus without complications: Secondary | ICD-10-CM

## 2014-05-16 LAB — POCT GLYCOSYLATED HEMOGLOBIN (HGB A1C): Hemoglobin A1C: 7.1

## 2014-05-16 MED ORDER — GLIPIZIDE 10 MG PO TABS: 10.0000 mg | ORAL_TABLET | Freq: Two times a day (BID) | ORAL | Status: DC

## 2014-05-16 MED ORDER — SITAGLIPTIN PHOSPHATE 100 MG PO TABS: 100.0000 mg | ORAL_TABLET | Freq: Every day | ORAL | Status: DC

## 2014-05-16 NOTE — Progress Notes (Addendum)
   Subjective:    Patient ID: Amanda Morris, female    DOB: [birth year], 64 y.o.   MRN: [id number]  HPI Diabetes - no hypoglycemic events. No wounds or sores that are not healing well. No increased thirst or urination. Checking glucose at home. Taking medications as prescribed without any side effects.  NO regular exercise. Doing well with the medication. No side effects or concerns at this time. She is up 8 pounds since the last time she was here in October. Lab Results  Component Value Date   HGBA1C 7.1 05/16/2014      Review of Systems     Objective:   Physical Exam  Constitutional: She is oriented to person, place, and time. She appears well-developed and well-nourished.  HENT:  Head: Normocephalic and atraumatic.  Neck: Neck supple. No thyromegaly present.  Cardiovascular: Normal rate, regular rhythm and normal heart sounds.   Pulmonary/Chest: Effort normal and breath sounds normal.  Lymphadenopathy:    She has no cervical adenopathy.  Neurological: She is alert and oriented to person, place, and time.  Skin: Skin is warm and dry.  Psychiatric: She has a normal mood and affect. Her behavior is normal.          Assessment & Plan:  DM- uncontrolled. A1c is up to 7.1 today. Normally her A1c looks fantastic around 6.6. We discussed getting back on track with diet and exercise as she has gained about 8 pounds over the holidays. I think if she can get this back down and under control then her A1c will come back down nicely. We'll work on this over the next 3 months. I will see her back then. If A1c is still over 7 at that time then we'll need to adjust her medication regimen.

## 2014-05-24 DIAGNOSIS — W19XXXA Unspecified fall, initial encounter: Secondary | ICD-10-CM | POA: Diagnosis not present

## 2014-05-24 DIAGNOSIS — S7001XA Contusion of right hip, initial encounter: Secondary | ICD-10-CM | POA: Diagnosis not present

## 2014-05-24 DIAGNOSIS — Z888 Allergy status to other drugs, medicaments and biological substances status: Secondary | ICD-10-CM | POA: Diagnosis not present

## 2014-05-24 DIAGNOSIS — I10 Essential (primary) hypertension: Secondary | ICD-10-CM | POA: Diagnosis not present

## 2014-05-24 DIAGNOSIS — R296 Repeated falls: Secondary | ICD-10-CM | POA: Diagnosis not present

## 2014-05-24 DIAGNOSIS — Z7982 Long term (current) use of aspirin: Secondary | ICD-10-CM | POA: Diagnosis not present

## 2014-05-24 DIAGNOSIS — E785 Hyperlipidemia, unspecified: Secondary | ICD-10-CM | POA: Diagnosis not present

## 2014-05-24 DIAGNOSIS — M1711 Unilateral primary osteoarthritis, right knee: Secondary | ICD-10-CM | POA: Diagnosis not present

## 2014-05-24 DIAGNOSIS — Z886 Allergy status to analgesic agent status: Secondary | ICD-10-CM | POA: Diagnosis not present

## 2014-05-24 DIAGNOSIS — Z79899 Other long term (current) drug therapy: Secondary | ICD-10-CM | POA: Diagnosis not present

## 2014-05-24 DIAGNOSIS — Z91010 Allergy to peanuts: Secondary | ICD-10-CM | POA: Diagnosis not present

## 2014-05-24 DIAGNOSIS — M25461 Effusion, right knee: Secondary | ICD-10-CM | POA: Diagnosis not present

## 2014-05-24 DIAGNOSIS — E119 Type 2 diabetes mellitus without complications: Secondary | ICD-10-CM | POA: Diagnosis not present

## 2014-07-28 ENCOUNTER — Other Ambulatory Visit: Payer: Self-pay | Admitting: Family Medicine

## 2014-07-29 NOTE — Telephone Encounter (Signed)
Ok for one refill needs follow up per metheneys last note.

## 2014-07-29 NOTE — Telephone Encounter (Signed)
Pt had not been prescribed Rx recently, will route for approval.

## 2014-09-05 ENCOUNTER — Ambulatory Visit: Payer: Medicare Other | Admitting: Family Medicine

## 2014-10-26 ENCOUNTER — Other Ambulatory Visit: Payer: Self-pay | Admitting: Family Medicine

## 2014-11-13 ENCOUNTER — Other Ambulatory Visit: Payer: Self-pay | Admitting: Family Medicine

## 2014-11-29 ENCOUNTER — Other Ambulatory Visit: Payer: Self-pay | Admitting: Family Medicine

## 2014-12-02 ENCOUNTER — Ambulatory Visit (INDEPENDENT_AMBULATORY_CARE_PROVIDER_SITE_OTHER): Payer: Medicare Other | Admitting: Family Medicine

## 2014-12-02 ENCOUNTER — Encounter: Payer: Self-pay | Admitting: Family Medicine

## 2014-12-02 ENCOUNTER — Ambulatory Visit (INDEPENDENT_AMBULATORY_CARE_PROVIDER_SITE_OTHER): Payer: Medicare Other

## 2014-12-02 VITALS — BP 126/89 | HR 90 | Temp 98.0°F | Wt 221.0 lb

## 2014-12-02 DIAGNOSIS — M4316 Spondylolisthesis, lumbar region: Secondary | ICD-10-CM

## 2014-12-02 DIAGNOSIS — I700 Atherosclerosis of aorta: Secondary | ICD-10-CM | POA: Diagnosis not present

## 2014-12-02 DIAGNOSIS — N949 Unspecified condition associated with female genital organs and menstrual cycle: Secondary | ICD-10-CM | POA: Diagnosis not present

## 2014-12-02 DIAGNOSIS — E119 Type 2 diabetes mellitus without complications: Secondary | ICD-10-CM

## 2014-12-02 DIAGNOSIS — M47816 Spondylosis without myelopathy or radiculopathy, lumbar region: Secondary | ICD-10-CM | POA: Diagnosis not present

## 2014-12-02 DIAGNOSIS — R102 Pelvic and perineal pain: Secondary | ICD-10-CM

## 2014-12-02 DIAGNOSIS — E1140 Type 2 diabetes mellitus with diabetic neuropathy, unspecified: Secondary | ICD-10-CM

## 2014-12-02 DIAGNOSIS — M25551 Pain in right hip: Secondary | ICD-10-CM

## 2014-12-02 LAB — POCT URINALYSIS DIPSTICK
Bilirubin, UA: NEGATIVE
Blood, UA: NEGATIVE
Glucose, UA: NEGATIVE
Ketones, UA: NEGATIVE
Leukocytes, UA: NEGATIVE
Nitrite, UA: NEGATIVE
Protein, UA: NEGATIVE
Spec Grav, UA: 1.02
Urobilinogen, UA: 1
pH, UA: 7

## 2014-12-02 LAB — COMPLETE METABOLIC PANEL WITH GFR
ALT: 13 U/L (ref 6–29)
AST: 19 U/L (ref 10–35)
Albumin: 3.8 g/dL (ref 3.6–5.1)
Alkaline Phosphatase: 93 U/L (ref 33–130)
BUN: 12 mg/dL (ref 7–25)
CO2: 25 mmol/L (ref 20–31)
Calcium: 8.7 mg/dL (ref 8.6–10.4)
Chloride: 105 mmol/L (ref 98–110)
Creat: 0.8 mg/dL (ref 0.50–0.99)
GFR, Est African American: >89 mL/min (ref >=60)
GFR, Est Non African American: 79 mL/min (ref >=60)
Glucose, Bld: 119 mg/dL — ABNORMAL HIGH (ref 65–99)
Potassium: 4.3 mmol/L (ref 3.5–5.3)
Sodium: 142 mmol/L (ref 135–146)
Total Bilirubin: 0.4 mg/dL (ref 0.2–1.2)
Total Protein: 6.8 g/dL (ref 6.1–8.1)

## 2014-12-02 LAB — CBC
HCT: 40.1 % (ref 36.0–46.0)
Hemoglobin: 12.3 g/dL (ref 12.0–15.0)
MCH: 23.9 pg — ABNORMAL LOW (ref 26.0–34.0)
MCHC: 30.7 g/dL (ref 30.0–36.0)
MCV: 78 fL (ref 78.0–100.0)
MPV: 11.3 fL (ref 8.6–12.4)
Platelets: 302 10*3/uL (ref 150–400)
RBC: 5.14 MIL/uL — ABNORMAL HIGH (ref 3.87–5.11)
RDW: 15.8 % — ABNORMAL HIGH (ref 11.5–15.5)
WBC: 8.7 10*3/uL (ref 4.0–10.5)

## 2014-12-02 LAB — POCT GLYCOSYLATED HEMOGLOBIN (HGB A1C): Hemoglobin A1C: 6.9

## 2014-12-02 MED ORDER — POLYETHYLENE GLYCOL 3350 17 GM/SCOOP PO POWD: 17.0000 g | Freq: Every day | ORAL | Status: DC

## 2014-12-02 NOTE — Assessment & Plan Note (Signed)
Right pelvis pain is somewhat unclear etiology. I think she may have 2 separate etiologies.  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 X-ray shows DJD and DDD. Plan for Tylenol and return to clinic in 2 weeks 2) constipation: I think patient's abdominal pain may be due to constipation. Trial of MiraLAX. Metabolic panel pending.

## 2014-12-02 NOTE — Assessment & Plan Note (Signed)
Refill medicines.

## 2014-12-02 NOTE — Progress Notes (Signed)
Quick Note:  Xray of hip and back shows arthrtis ______

## 2014-12-02 NOTE — Progress Notes (Addendum)
Amanda Morris is a 64 y.o. female who presents to [facility]  today for right pelvis pain. Patient has a one-year history of pain in her right back and pelvis. Pain is worse with ambulation. No urinary frequency urgency dysuria fevers or chills nausea or vomiting. She denies any diarrhea but does note some constipation. She denies any vaginal discharge or bleeding. She states that she has already had a hysterectomy. She denies any pain radiating down her leg significantly. No weakness or numbness bowel bladder dysfunction or difficulty walking. She's tried some over-the-counter medicines which have helped a bit.   Additionally patient is here to follow up diabetes. She notes that she takes her medicines and has no real issues. No polyuria or polydipsia. Past Medical History  Diagnosis Date  . Allergy   . Palpitations    Past Surgical History  Procedure Laterality Date  . Abdominal hysterectomy  1982  . Shoulder surgery  06/2008, 09/2010    Right, by Dr. Karie Soda, then Dr. Judeth Horn   History  Substance Use Topics  . Smoking status: Former Smoker    Types: Cigarettes    Quit date: 07/30/2007  . Smokeless tobacco: Not on file  . Alcohol Use: No   ROS as above Medications: Current Outpatient Prescriptions  Medication Sig Dispense Refill  . acetaZOLAMIDE (DIAMOX) 250 MG tablet Take 250 mg by mouth daily.    . AMBULATORY NON FORMULARY MEDICATION Medication Name: Shingles vaccine IM x 1 1 vial 0  . aspirin 81 MG tablet Take 81 mg by mouth daily.      Marland Kitchen dexlansoprazole (DEXILANT) 60 MG capsule Take 60 mg by mouth daily.    . fluticasone (FLONASE) 50 MCG/ACT nasal spray INHALE 2 SPRAYS IN EACH NOSTRIL EACH DAY 16 g 11  . glipiZIDE (GLUCOTROL) 10 MG tablet TAKE 1 TABLET (10 MG TOTAL) BY MOUTH 2 (TWO) TIMES DAILY BEFORE A MEAL. 60 tablet 0  . JANUVIA 100 MG tablet TAKE 1 TABLET (100 MG TOTAL) BY MOUTH DAILY. 30 tablet 0  . lamoTRIgine (LAMICTAL) 25 MG  tablet Take 25 mg by mouth. 1 tab in the morning and 1 tab in the evening    . naproxen sodium (ANAPROX) 220 MG tablet Take 220 mg by mouth 2 (two) times daily with a meal.    . perphenazine-amitriptyline (ETRAFON/TRIAVIL) 2-25 MG TABS Take 1 tablet by mouth 3 (three) times daily.  3  . pravastatin (PRAVACHOL) 40 MG tablet TAKE 1 TABLET (40 MG TOTAL) BY MOUTH DAILY. 90 tablet 0  . polyethylene glycol powder (GLYCOLAX/MIRALAX) powder Take 17 g by mouth daily. 850 g 1   No current facility-administered medications for this visit.   Allergies  Allergen Reactions  . Metformin And Related Other (See Comments)    Nausea and diarrhea.   . Penicillins Hives     Exam:  BP 126/89 mmHg  Pulse 90  Temp(Src) 98 F (36.7 C) (Oral)  Wt 221 lb (100.245 kg) Gen: Well NAD HEENT: EOMI,  MMM Lungs: Normal work of breathing. CTABL Heart: RRR no MRG Abd: NABS, Soft. Nondistended, mildly tender to palpation right lower quadrant without rebound or guarding. No masses palpated Exts: Brisk capillary refill, warm and well perfused.  MSK: Nontender to spinal midline of the lumbar spine. Mildly tender palpation right SI joint. Decreased lumbar range of motion due to pain. Hips bilaterally are normal appearing and nontender. Right hip pain with internal motion with limited range of motion. Lower extremity reflexes are  strength are equal and normal bilaterally.   Results for orders placed or performed in visit on 12/02/14 (from the past 24 hour(s))  POCT HgB A1C     Status: None   Collection Time: 12/02/14 10:13 AM  Result Value Ref Range   Hemoglobin A1C 6.9   POCT Urinalysis Dipstick     Status: None   Collection Time: 12/02/14 10:13 AM  Result Value Ref Range   Color, UA dark yellow    Clarity, UA clear    Glucose, UA neg    Bilirubin, UA neg    Ketones, UA neg    Spec Grav, UA 1.020    Blood, UA neg    pH, UA 7.0    Protein, UA neg    Urobilinogen, UA 1.0    Nitrite, UA neg    Leukocytes, UA  Negative Negative   Dg Lumbar Spine Complete  12/02/2014   CLINICAL DATA:  Low back and right hip pain.  EXAM: LUMBAR SPINE - COMPLETE 4+ VIEW  COMPARISON:  None.  FINDINGS: Paraspinal soft tissues are normal. Degenerative changes lumbar spine and both hips. 5 mm anterolisthesis L4 on L5. Aortoiliac atherosclerotic vascular disease.  IMPRESSION: 1. Diffuse degenerative change lumbar spine with 5 mm anterolisthesis L4 on L5.  2.  Aortoiliac atherosclerotic vascular disease.   Electronically Signed   By: Marcello Moores  Register   On: 12/02/2014 10:27   Dg Hip Unilat With Pelvis 2-3 Views Right  12/02/2014   CLINICAL DATA:  Back pain right hip pain.  No known injury.  EXAM: DG HIP (WITH OR WITHOUT PELVIS) 2-3V RIGHT  COMPARISON:  None.  FINDINGS: No acute soft tissue bony abnormality. Degenerative changes lumbar spine and both hips. No evidence of fracture or dislocation.  IMPRESSION: No acute soft tissue bony abnormality. Degenerative changes lumbar spine and both hips. No acute abnormality.   Electronically Signed   By: Marcello Moores  Register   On: 12/02/2014 10:28     Please see individual assessment and plan sections.

## 2014-12-02 NOTE — Patient Instructions (Signed)
Thank you for coming in today. Return in 2 week.  Try the miralax daily.  If your belly pain worsens, or you have high fever, bad vomiting, blood in your stool or black tarry stool go to the Emergency Room.

## 2014-12-03 NOTE — Progress Notes (Signed)
Quick Note:  Normal, no changes. ______ 

## 2014-12-04 MED ORDER — CIPROFLOXACIN HCL 500 MG PO TABS: 500.0000 mg | ORAL_TABLET | Freq: Two times a day (BID) | ORAL | Status: DC

## 2014-12-04 NOTE — Addendum Note (Signed)
Addended by: Gregor Hams on: 12/04/2014 01:13 PM   Modules accepted: Orders

## 2014-12-05 LAB — URINE CULTURE: Colony Count: >=100000

## 2014-12-06 NOTE — Progress Notes (Signed)
Quick Note:  Urine culture shows bacteria sensitive to the antibiotic we picked. Continue cipro. ______

## 2014-12-07 ENCOUNTER — Other Ambulatory Visit: Payer: Self-pay | Admitting: Family Medicine

## 2014-12-16 ENCOUNTER — Ambulatory Visit (INDEPENDENT_AMBULATORY_CARE_PROVIDER_SITE_OTHER): Payer: Medicare Other | Admitting: Family Medicine

## 2014-12-16 ENCOUNTER — Encounter: Payer: Self-pay | Admitting: Family Medicine

## 2014-12-16 ENCOUNTER — Ambulatory Visit (INDEPENDENT_AMBULATORY_CARE_PROVIDER_SITE_OTHER): Payer: Medicare Other

## 2014-12-16 VITALS — BP 138/89 | HR 100 | Wt 220.0 lb

## 2014-12-16 DIAGNOSIS — R102 Pelvic and perineal pain: Secondary | ICD-10-CM | POA: Diagnosis not present

## 2014-12-16 DIAGNOSIS — K5900 Constipation, unspecified: Secondary | ICD-10-CM

## 2014-12-16 DIAGNOSIS — N949 Unspecified condition associated with female genital organs and menstrual cycle: Secondary | ICD-10-CM | POA: Diagnosis not present

## 2014-12-16 DIAGNOSIS — R1031 Right lower quadrant pain: Secondary | ICD-10-CM | POA: Diagnosis not present

## 2014-12-16 NOTE — Patient Instructions (Signed)
Thank you for coming in today. Get xray today.  Return in 2 weeks or sooner.  If not better we will get a CT scan.  If your belly pain worsens, or you have high fever, bad vomiting, blood in your stool or black tarry stool go to the Emergency Room.

## 2014-12-16 NOTE — Progress Notes (Signed)
Amanda Morris is a 64 y.o. female who presents to [facility]  today for follow-up abdominal pain. Patient was seen recently and diagnosed with a urinary tract infection. She was treated with Cipro. Cultures show Escherichia coli sensitive to Cipro. She notes that she's feeling better but has persistent right lower quadrant abdominal pain. Pain is slowly improving. No vomiting or diarrhea. Additionally she has been using MiraLAX which seems to help significantly. No fevers or chills. She notes that she has a history of hysterectomy and oophorectomy (self-reported).   Past Medical History  Diagnosis Date  . Allergy   . Palpitations    Past Surgical History  Procedure Laterality Date  . Abdominal hysterectomy  1982  . Shoulder surgery  06/2008, 09/2010    Right, by Dr. Karie Soda, then Dr. Judeth Horn   Social History  Substance Use Topics  . Smoking status: Former Smoker    Types: Cigarettes    Quit date: 07/30/2007  . Smokeless tobacco: Not on file  . Alcohol Use: No   ROS as above Medications: Current Outpatient Prescriptions  Medication Sig Dispense Refill  . acetaZOLAMIDE (DIAMOX) 250 MG tablet Take 250 mg by mouth daily.    . AMBULATORY NON FORMULARY MEDICATION Medication Name: Shingles vaccine IM x 1 1 vial 0  . aspirin 81 MG tablet Take 81 mg by mouth daily.      . ciprofloxacin (CIPRO) 500 MG tablet Take 1 tablet (500 mg total) by mouth 2 (two) times daily. 14 tablet 0  . dexlansoprazole (DEXILANT) 60 MG capsule Take 60 mg by mouth daily.    . fluticasone (FLONASE) 50 MCG/ACT nasal spray INHALE 2 SPRAYS IN EACH NOSTRIL EACH DAY 16 g 11  . glipiZIDE (GLUCOTROL) 10 MG tablet TAKE 1 TABLET (10 MG TOTAL) BY MOUTH 2 (TWO) TIMES DAILY BEFORE A MEAL. 60 tablet 0  . JANUVIA 100 MG tablet TAKE 1 TABLET (100 MG TOTAL) BY MOUTH DAILY. 90 tablet 0  . lamoTRIgine (LAMICTAL) 25 MG tablet Take 25 mg by mouth. 1 tab in the morning and 1 tab in the evening     . naproxen sodium (ANAPROX) 220 MG tablet Take 220 mg by mouth 2 (two) times daily with a meal.    . perphenazine-amitriptyline (ETRAFON/TRIAVIL) 2-25 MG TABS Take 1 tablet by mouth 3 (three) times daily.  3  . polyethylene glycol powder (GLYCOLAX/MIRALAX) powder Take 17 g by mouth daily. 850 g 1  . pravastatin (PRAVACHOL) 40 MG tablet TAKE 1 TABLET (40 MG TOTAL) BY MOUTH DAILY. 90 tablet 0   No current facility-administered medications for this visit.   Allergies  Allergen Reactions  . Metformin And Related Other (See Comments)    Nausea and diarrhea.   . Penicillins Hives     Exam:  BP 138/89 mmHg  Pulse 100  Wt 220 lb (99.791 kg) Gen: Well NAD HEENT: EOMI,  MMM Lungs: Normal work of breathing. CTABL Heart: RRR no MRG Abd: NABS, Soft. Nondistended, tender palpation right lower quadrant with no significant guarding or rebound. No masses palpated Exts: Brisk capillary refill, warm and well perfused.   No results found for this or any previous visit (from the past 24 hour(s)). No results found.   Please see individual assessment and plan sections.

## 2014-12-16 NOTE — Progress Notes (Signed)
Quick Note:  Xray looks normal. If not getting better we will get a CT scan. ______

## 2014-12-16 NOTE — Assessment & Plan Note (Signed)
Resolving urinary tract infection. Repeat urine culture pending. Check abdominal x-ray today. Revisit in 2 weeks if not better. At that time will order CT scan of the abdomen and pelvis. Labs obtained at the last visit were relatively normal.

## 2014-12-18 LAB — URINE CULTURE
Colony Count: NO GROWTH
Organism ID, Bacteria: NO GROWTH

## 2014-12-19 ENCOUNTER — Ambulatory Visit: Payer: Medicare Other | Admitting: Family Medicine

## 2014-12-20 DIAGNOSIS — Z886 Allergy status to analgesic agent status: Secondary | ICD-10-CM | POA: Diagnosis not present

## 2014-12-20 DIAGNOSIS — I10 Essential (primary) hypertension: Secondary | ICD-10-CM | POA: Diagnosis not present

## 2014-12-20 DIAGNOSIS — Z880 Allergy status to penicillin: Secondary | ICD-10-CM | POA: Diagnosis not present

## 2014-12-20 DIAGNOSIS — X58XXXA Exposure to other specified factors, initial encounter: Secondary | ICD-10-CM | POA: Diagnosis not present

## 2014-12-20 DIAGNOSIS — K219 Gastro-esophageal reflux disease without esophagitis: Secondary | ICD-10-CM | POA: Diagnosis not present

## 2014-12-20 DIAGNOSIS — E785 Hyperlipidemia, unspecified: Secondary | ICD-10-CM | POA: Diagnosis not present

## 2014-12-20 DIAGNOSIS — Z8719 Personal history of other diseases of the digestive system: Secondary | ICD-10-CM | POA: Diagnosis not present

## 2014-12-20 DIAGNOSIS — Z888 Allergy status to other drugs, medicaments and biological substances status: Secondary | ICD-10-CM | POA: Diagnosis not present

## 2014-12-20 DIAGNOSIS — Z7982 Long term (current) use of aspirin: Secondary | ICD-10-CM | POA: Diagnosis not present

## 2014-12-20 DIAGNOSIS — Z79899 Other long term (current) drug therapy: Secondary | ICD-10-CM | POA: Diagnosis not present

## 2014-12-20 DIAGNOSIS — S76011A Strain of muscle, fascia and tendon of right hip, initial encounter: Secondary | ICD-10-CM | POA: Diagnosis not present

## 2014-12-25 ENCOUNTER — Other Ambulatory Visit: Payer: Self-pay | Admitting: Family Medicine

## 2014-12-31 ENCOUNTER — Ambulatory Visit: Payer: Medicare Other | Admitting: Family Medicine

## 2015-01-20 ENCOUNTER — Other Ambulatory Visit: Payer: Self-pay | Admitting: Family Medicine

## 2015-01-22 DIAGNOSIS — R635 Abnormal weight gain: Secondary | ICD-10-CM | POA: Diagnosis not present

## 2015-01-22 DIAGNOSIS — R718 Other abnormality of red blood cells: Secondary | ICD-10-CM | POA: Diagnosis not present

## 2015-01-22 DIAGNOSIS — K625 Hemorrhage of anus and rectum: Secondary | ICD-10-CM | POA: Diagnosis not present

## 2015-01-22 DIAGNOSIS — R1013 Epigastric pain: Secondary | ICD-10-CM | POA: Diagnosis not present

## 2015-01-22 DIAGNOSIS — R1084 Generalized abdominal pain: Secondary | ICD-10-CM | POA: Diagnosis not present

## 2015-01-22 DIAGNOSIS — K921 Melena: Secondary | ICD-10-CM | POA: Diagnosis not present

## 2015-01-22 LAB — BASIC METABOLIC PANEL
BUN: 14 mg/dL (ref 4–21)
Creatinine: 0.8 mg/dL (ref 0.5–1.1)
Glucose: 158 mg/dL
Potassium: 4.1 mmol/L (ref 3.4–5.3)
Sodium: 143 mmol/L (ref 137–147)

## 2015-01-22 LAB — CBC AND DIFFERENTIAL
Hemoglobin: 12.5 g/dL (ref 12.0–16.0)
Platelets: 254 10*3/uL (ref 150–399)
WBC: 7.6 10^3/mL

## 2015-01-22 LAB — COMPLETE METABOLIC PANEL WITH GFR
Calcium, Ser: 8.9
Chloride: 105 mmol/L

## 2015-01-22 LAB — TSH: TSH: 1.45 u[IU]/mL (ref 0.41–5.90)

## 2015-01-22 LAB — HEPATIC FUNCTION PANEL
ALT: 13 U/L (ref 7–35)
AST: 15 U/L (ref 13–35)
Alkaline Phosphatase: 94 U/L (ref 25–125)

## 2015-01-23 ENCOUNTER — Other Ambulatory Visit: Payer: Self-pay | Admitting: Family Medicine

## 2015-01-23 ENCOUNTER — Telehealth: Payer: Self-pay | Admitting: *Deleted

## 2015-01-23 NOTE — Telephone Encounter (Signed)
Pt called and lvm asking for a letter to be dismissed from jury duty. Letter written left up front for p/u  Amanda Morris

## 2015-01-23 NOTE — Telephone Encounter (Signed)
Pt due for labs, has upcoming appt with PCP.

## 2015-02-03 DIAGNOSIS — R1084 Generalized abdominal pain: Secondary | ICD-10-CM | POA: Diagnosis not present

## 2015-02-03 DIAGNOSIS — R635 Abnormal weight gain: Secondary | ICD-10-CM | POA: Diagnosis not present

## 2015-02-03 DIAGNOSIS — K625 Hemorrhage of anus and rectum: Secondary | ICD-10-CM | POA: Diagnosis not present

## 2015-02-04 ENCOUNTER — Encounter: Payer: Self-pay | Admitting: Family Medicine

## 2015-02-04 DIAGNOSIS — Z888 Allergy status to other drugs, medicaments and biological substances status: Secondary | ICD-10-CM | POA: Diagnosis not present

## 2015-02-04 DIAGNOSIS — S2020XA Contusion of thorax, unspecified, initial encounter: Secondary | ICD-10-CM | POA: Diagnosis not present

## 2015-02-04 DIAGNOSIS — E785 Hyperlipidemia, unspecified: Secondary | ICD-10-CM | POA: Diagnosis not present

## 2015-02-04 DIAGNOSIS — S299XXA Unspecified injury of thorax, initial encounter: Secondary | ICD-10-CM | POA: Diagnosis not present

## 2015-02-04 DIAGNOSIS — I10 Essential (primary) hypertension: Secondary | ICD-10-CM | POA: Diagnosis not present

## 2015-02-04 DIAGNOSIS — S20211A Contusion of right front wall of thorax, initial encounter: Secondary | ICD-10-CM | POA: Diagnosis not present

## 2015-02-04 DIAGNOSIS — Z880 Allergy status to penicillin: Secondary | ICD-10-CM | POA: Diagnosis not present

## 2015-02-04 DIAGNOSIS — E119 Type 2 diabetes mellitus without complications: Secondary | ICD-10-CM | POA: Diagnosis not present

## 2015-02-04 DIAGNOSIS — Z7984 Long term (current) use of oral hypoglycemic drugs: Secondary | ICD-10-CM | POA: Diagnosis not present

## 2015-02-04 DIAGNOSIS — R9431 Abnormal electrocardiogram [ECG] [EKG]: Secondary | ICD-10-CM | POA: Diagnosis not present

## 2015-02-04 DIAGNOSIS — S20212A Contusion of left front wall of thorax, initial encounter: Secondary | ICD-10-CM | POA: Diagnosis not present

## 2015-02-04 DIAGNOSIS — K219 Gastro-esophageal reflux disease without esophagitis: Secondary | ICD-10-CM | POA: Diagnosis not present

## 2015-02-13 DIAGNOSIS — Z23 Encounter for immunization: Secondary | ICD-10-CM | POA: Diagnosis not present

## 2015-02-14 ENCOUNTER — Ambulatory Visit (INDEPENDENT_AMBULATORY_CARE_PROVIDER_SITE_OTHER): Payer: Medicare Other | Admitting: Family Medicine

## 2015-02-14 ENCOUNTER — Encounter: Payer: Self-pay | Admitting: Family Medicine

## 2015-02-14 ENCOUNTER — Other Ambulatory Visit: Payer: Self-pay | Admitting: Family Medicine

## 2015-02-14 VITALS — BP 130/90 | HR 82 | Temp 98.2°F | Resp 18 | Wt 216.0 lb

## 2015-02-14 DIAGNOSIS — Z23 Encounter for immunization: Secondary | ICD-10-CM | POA: Diagnosis not present

## 2015-02-14 DIAGNOSIS — M4310 Spondylolisthesis, site unspecified: Secondary | ICD-10-CM

## 2015-02-14 DIAGNOSIS — E119 Type 2 diabetes mellitus without complications: Secondary | ICD-10-CM

## 2015-02-14 DIAGNOSIS — Z1159 Encounter for screening for other viral diseases: Secondary | ICD-10-CM | POA: Diagnosis not present

## 2015-02-14 DIAGNOSIS — M545 Low back pain, unspecified: Secondary | ICD-10-CM

## 2015-02-14 DIAGNOSIS — I10 Essential (primary) hypertension: Secondary | ICD-10-CM | POA: Insufficient documentation

## 2015-02-14 DIAGNOSIS — Z114 Encounter for screening for human immunodeficiency virus [HIV]: Secondary | ICD-10-CM | POA: Diagnosis not present

## 2015-02-14 DIAGNOSIS — I70299 Other atherosclerosis of native arteries of extremities, unspecified extremity: Secondary | ICD-10-CM | POA: Diagnosis not present

## 2015-02-14 DIAGNOSIS — R102 Pelvic and perineal pain: Secondary | ICD-10-CM

## 2015-02-14 DIAGNOSIS — I700 Atherosclerosis of aorta: Secondary | ICD-10-CM

## 2015-02-14 DIAGNOSIS — Q762 Congenital spondylolisthesis: Secondary | ICD-10-CM | POA: Diagnosis not present

## 2015-02-14 DIAGNOSIS — I708 Atherosclerosis of other arteries: Secondary | ICD-10-CM

## 2015-02-14 LAB — LIPID PANEL
Cholesterol: 137 mg/dL (ref 125–200)
HDL: 35 mg/dL — ABNORMAL LOW (ref >=46)
LDL Cholesterol: 86 mg/dL (ref <130)
Total CHOL/HDL Ratio: 3.9 Ratio (ref <=5.0)
Triglycerides: 82 mg/dL (ref <150)
VLDL: 16 mg/dL (ref <30)

## 2015-02-14 LAB — POCT UA - MICROALBUMIN
Albumin/Creatinine Ratio, Urine, POC: <30
Creatinine, POC: 200 mg/dL
Microalbumin Ur, POC: 30 mg/L

## 2015-02-14 MED ORDER — GLIPIZIDE 10 MG PO TABS: ORAL_TABLET | ORAL | Status: DC

## 2015-02-14 MED ORDER — POLYETHYLENE GLYCOL 3350 17 GM/SCOOP PO POWD: 17.0000 g | Freq: Every day | ORAL | Status: DC

## 2015-02-14 MED ORDER — SITAGLIPTIN PHOSPHATE 100 MG PO TABS: ORAL_TABLET | ORAL | Status: DC

## 2015-02-14 NOTE — Progress Notes (Signed)
   Subjective:    Patient ID: Amanda Morris, female    DOB: 01/06/1951, 64 y.o.   MRN: [id number]  HPI Diabetes - no hypoglycemic events. No wounds or sores that are not healing well. No increased thirst or urination. Checking glucose at home. Taking medications as prescribed without any side effects. She has lost 7 lbs in the last 9 months.  Lab Results  Component Value Date   HGBA1C 6.9 12/02/2014    Hypertension- Pt denies chest pain, SOB, dizziness, or heart palpitations.  Taking meds as directed w/o problems.  Denies medication side effects.    Went to the ED about 3 weeks ago for her back.  She was given oxycocodone. She is still having a lot of pain and spasm.  Had lumbar xray in August showing diffuse degenerative changes lumbar spine with a 5 mm anterolisthesis of L4 on L5. She also had some noted aortic atherosclerotic vascular disease..   Review of Systems     Objective:   Physical Exam  Constitutional: She is oriented to person, place, and time. She appears well-developed and well-nourished.  HENT:  Head: Normocephalic and atraumatic.  Cardiovascular: Normal rate, regular rhythm and normal heart sounds.   Pulmonary/Chest: Effort normal and breath sounds normal.  Neurological: She is alert and oriented to person, place, and time.  Skin: Skin is warm and dry.  Psychiatric: She has a normal mood and affect. Her behavior is normal.          Assessment & Plan:  DM- well controlled. A1C is 6.8. Foot exam performed today. Reminded to get eye exam. Urine micro done today.   HTN - well controlled. F/U in 3-4 months. Due for lipids.    Low back pain with anterolisthesis. Review results of xray. Will refer to PT.    Aortoiliac atherosclerosis- continue pravastatin and work on controlling BP and sugars.    Had flu shot and shingles vaccine done at [facility].

## 2015-02-14 NOTE — Patient Instructions (Signed)
Please remember to get your eye exam.

## 2015-02-15 LAB — HEPATITIS C ANTIBODY: HCV Ab: NEGATIVE

## 2015-02-15 LAB — HIV ANTIBODY (ROUTINE TESTING W REFLEX): HIV 1&2 Ab, 4th Generation: NONREACTIVE

## 2015-02-20 ENCOUNTER — Telehealth: Payer: Self-pay

## 2015-02-20 NOTE — Telephone Encounter (Signed)
Pt called asking if she can take her Januvia twice daily. Informed pt per Dr. Madilyn Fireman that 100mg  is the max daily dose for the medication. Pt stated that she had been taking it twice daily told pr to resume the daily regimen of 100 mg per Dr. Madilyn Fireman.

## 2015-02-27 ENCOUNTER — Ambulatory Visit (INDEPENDENT_AMBULATORY_CARE_PROVIDER_SITE_OTHER): Payer: Medicare Other | Admitting: Physical Therapy

## 2015-02-27 ENCOUNTER — Encounter: Payer: Self-pay | Admitting: Physical Therapy

## 2015-02-27 DIAGNOSIS — R531 Weakness: Secondary | ICD-10-CM

## 2015-02-27 DIAGNOSIS — R52 Pain, unspecified: Secondary | ICD-10-CM

## 2015-02-27 DIAGNOSIS — R293 Abnormal posture: Secondary | ICD-10-CM | POA: Diagnosis not present

## 2015-02-27 NOTE — Therapy (Signed)
[place], [place], [address] Phone: [phone]   Fax:  [phone]  Physical Therapy Evaluation  Patient Details  Name: Amanda Morris MRN: [id number] Date of Birth: 19-Apr-1951 Referring Provider: Dr Madilyn Fireman  Encounter Date: 02/27/2015      PT End of Session - 02/27/15 1616    Visit Number 1   Number of Visits 4   Date for PT Re-Evaluation 04/24/15   PT Start Time 6962   PT Stop Time 1719   PT Time Calculation (min) 63 min   Activity Tolerance Patient limited by pain      Past Medical History  Diagnosis Date  . Allergy   . Palpitations     Past Surgical History  Procedure Laterality Date  . Abdominal hysterectomy  1982  . Shoulder surgery  06/2008, 09/2010    Right, by Dr. Karie Soda, then Dr. Judeth Horn    There were no vitals filed for this visit.  Visit Diagnosis:  Generalized pain - Plan: PT plan of care cert/re-cert  Weakness generalized - Plan: PT plan of care cert/re-cert  Abnormal posture - Plan: PT plan of care cert/re-cert      Subjective Assessment - 02/27/15 1617    Subjective Pt reports she had had some back pain for about 1-2 yrs ago, she was at her physical and reported this. Was in the ED a month ago due to pain and was issued pain medicine. Pt has financial concerns with attending PT. Requests less frequent visits   How long can you sit comfortably? no limitation   How long can you walk comfortably? no limitations   Diagnostic tests x-rays and MRI show degeneration and anterolithesis   Patient Stated Goals relieve pain, straighten up her posture.    Currently in Pain? Yes  pain is random can come on at any times up to 9/10 atleast once a day.    Pain Score 2    Pain Location Back   Pain Orientation Lower   Pain Descriptors / Indicators Dull   Pain Frequency Intermittent   Aggravating Factors  transiting sit to stand and moving from one position   Pain  Relieving Factors nothing when it gets real bad.             [facility] PT Assessment - 02/27/15 0001    Assessment   Medical Diagnosis anterolithesis   Referring Provider Dr Madilyn Fireman   Onset Date/Surgical Date 01/27/15   Next MD Visit yearly visit or as needed   Prior Therapy not for low back   Precautions   Precautions None   Balance Screen   Has the patient fallen in the past 6 months Yes   How many times? 2  fell going up the stairs due to knee pain   Has the patient had a decrease in activity level because of a fear of falling?  Yes  moved to an apartment without stairs   Is the patient reluctant to leave their home because of a fear of falling?  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, pulling dressers out to [employer]   Leisure be with her family   Observation/Other Assessments   Focus on Therapeutic Outcomes (FOTO)  59% limited   Functional Tests   Functional tests Squat   Squat   Comments equal  weight bearing   Posture/Postural Control   Posture/Postural Control Postural limitations   Postural Limitations Rounded Shoulders;Forward head;Increased thoracic kyphosis;Decreased lumbar lordosis  able to stand straight after a few minutes   ROM / Strength   AROM / PROM / Strength AROM;Strength   AROM   AROM Assessment Site Hip;Knee;Ankle   Right/Left Knee Left;Right   Right/Left Ankle --   Strength   Strength Assessment Site Hip;Knee;Ankle;Lumbar   Right/Left Hip Right;Left   Right Hip Flexion 4+/5   Right Hip Extension 4+/5   Right Hip ABduction 4-/5   Left Hip Flexion 5/5   Left Hip Extension 5/5   Left Hip ABduction 4/5   Right/Left Knee Left;Right   Right Knee Flexion 4/5   Right Knee Extension --  5-/5   Left Knee Flexion 4+/5   Left Knee Extension 5/5   Right/Left Ankle --  WNL   Lumbar Flexion --  TA poor    Lumbar Extension --  multifidi Lt fair, Rt fair (-), both delayed.    Flexibility   Soft Tissue Assessment /Muscle Length yes   Hamstrings Rt 50 degrees, Lt 72   Palpation   Spinal mobility upper lumbar and lower thoracic assessed and WNL   SI assessment  pain with grade II PA mobs   Palpation comment very tender in bilat lumbar paraspinals and upper gluts, Rt QL   Special Tests    Special Tests Lumbar   Lumbar Tests Slump Test;Straight Leg Raise   Slump test   Findings Negative   Straight Leg Raise   Findings Negative   Transfers   Transfers Sit to Stand   Sit to Stand With upper extremity assist   Comments slow to assume upright posture. due to pain   Ambulation/Gait   Ambulation/Gait --  observed in the clinic   Gait Pattern Shuffle;Trunk flexed;Decreased step length - left;Decreased step length - right                   [facility] Adult PT Treatment/Exercise - 02/27/15 0001    Exercises   Exercises Lumbar   Lumbar Exercises: Stretches   Passive Hamstring Stretch 2 reps;30 seconds  supine with strap   Lumbar Exercises: Supine   Other Supine Lumbar Exercises chin tuck x 3 sec x 10 reps; shoulder press x 3 sec x 10 reps; leg press x 3 sec x 5 reps each leg    Modalities   Modalities Electrical Stimulation;Moist Heat   Moist Heat Therapy   Number Minutes Moist Heat 15 Minutes   Moist Heat Location Lumbar Spine   Electrical Stimulation   Electrical Stimulation Location Rt LB/ glute   Electrical Stimulation Action IFC   Electrical Stimulation Parameters to tolerance   Electrical Stimulation Goals Pain                PT Education - 02/27/15 1705    Education provided Yes   Education Details HEP   Person(s) Educated Patient   Methods Handout;Explanation;Verbal cues   Comprehension Verbalized understanding;Returned demonstration          PT Short Term Goals - 02/27/15 1712    PT SHORT TERM GOAL #1   Title I with initial HEP (03/27/15)   Time 4    Period Weeks   Status New   PT SHORT TERM GOAL #2   Title report pain decrease =/> 25% with transitioning sit to stand ( 03/27/15)    Time 4   Period Weeks   Status  New   PT SHORT TERM GOAL #3   Title increase strength Rt hip =/> 4+/5 through out (03/27/15)    Time 4   Period Weeks   Status New           PT Long Term Goals - 02/27/15 1714    PT LONG TERM GOAL #1   Title I with advanced HEP (04/24/15)    Time 8   Period Weeks   Status New   PT LONG TERM GOAL #2   Title assume an upright posture without pain or difficulty (04/24/15)   Time 8   Period Weeks   Status New   PT LONG TERM GOAL #3   Title perform core strengthening with good contraction of TA and multifidi (04/24/15)    Time 8   Period Weeks   Status New   PT LONG TERM GOAL #4   Title improve FOTO =/> 45% limited , CK level (04/24/15)   Time 8   Period Weeks   Status New               Plan - 02/27/15 1701    Clinical Impression Statement 64 yo female presents with h/o low back pain that has increased over the last month.  She was seen in the ED, received pain meds and has followed up with her primary care MD.  Her pain limits her abilty to stand upright, she also has weakness in the Rt LE and core.  [place] is very tender to palpation in her low back.  She is concerned about her finances and has requested to attend PT every other week to spread out the costs.    Pt will benefit from skilled therapeutic intervention in order to improve on the following deficits Postural dysfunction;Decreased strength;Decreased mobility;Pain;Decreased activity tolerance;Difficulty walking   Rehab Potential Good   PT Frequency --  every other week   PT Duration 8 weeks   PT Treatment/Interventions Ultrasound;Traction;Patient/family education;Cryotherapy;Electrical Stimulation;Moist Heat;Therapeutic exercise;Manual techniques;Taping   PT Next Visit Plan progress core and LE strengthening  STW & modalities PRN     Consulted and Agree with Plan of Care Patient         Problem List Patient Active Problem List   Diagnosis Date Noted  . Essential hypertension 02/14/2015  . Pain in female pelvis 12/02/2014  . Postprandial vomiting 02/04/2014  . Obesity (BMI 30-39.9) 11/01/2013  . Carotid artery stenosis 06/07/2013  . Unspecified hereditary and idiopathic peripheral neuropathy 06/07/2013  . History of migraine 05/04/2013  . Diabetes mellitus without complication ([place]) 40/01/2724  . SVT (supraventricular tachycardia) ([place]) 11/03/2010  . GERD (gastroesophageal reflux disease) 10/29/2010  . Chronic low back pain 10/29/2010  . Other and unspecified hyperlipidemia 10/29/2010    Jeral Pinch PT 02/27/2015, 5:18 PM  [facility] [place], [place], [address] Phone: [phone]   Fax:  [phone]  Name: Amanda Morris MRN: [id number] Date of Birth: 06-11-1950

## 2015-02-27 NOTE — Patient Instructions (Signed)
.  posturDecompression Exercise: Head Support    Lie on back on firm surface, knees bent, feet flat, arms turned up, out to sides, backs of hands down. Support under head: pillow. Time _5-10__ minutes.   Copyright  VHI. All rights reserved.  Head Press With [place] chin SLIGHTLY toward chest, keep mouth closed. Feel weight on back of head. Increase weight by pressing head down. Hold __2-3_ seconds. Relax. Repeat _10__ times. Surface: floor   Shoulder Press    Press both shoulders down. Hold ___ seconds. Repeat ___ times. Press one shoulder down. Hold _3__ seconds Repeat _10__ times. Do other shoulder. If unable to press one or both shoulders, lie in position a few sessions until you can.   Abdominal Bracing With Pelvic Floor (Hook-Lying)    With neutral spine, tighten pelvic floor and abdominals. Hold 5 sec. Repeat _10__ times. Do _1__ times a day.   Leg Straightener    Straighten one leg down. Hold maximal position _3__ seconds. Re-bend knee. Do other leg. Each leg 10___ times. Surface: floor  Hamstring Step 1    Straighten left knee. Keep knee level with other knee or on bolster. Hold _30__ seconds. Relax knee by returning foot to start. Repeat _2-3_ times.  Switch to opposite side.    [facility] [place], [place] [address]  [phone] (office) [phone] (fax)

## 2015-03-06 ENCOUNTER — Other Ambulatory Visit: Payer: Self-pay | Admitting: Family Medicine

## 2015-03-07 DIAGNOSIS — K625 Hemorrhage of anus and rectum: Secondary | ICD-10-CM | POA: Diagnosis not present

## 2015-03-07 DIAGNOSIS — K649 Unspecified hemorrhoids: Secondary | ICD-10-CM | POA: Diagnosis not present

## 2015-03-07 DIAGNOSIS — Z1231 Encounter for screening mammogram for malignant neoplasm of breast: Secondary | ICD-10-CM | POA: Diagnosis not present

## 2015-03-07 LAB — HM MAMMOGRAPHY

## 2015-03-12 ENCOUNTER — Encounter: Payer: Self-pay | Admitting: Family Medicine

## 2015-03-13 ENCOUNTER — Encounter: Payer: Medicare Other | Admitting: Physical Therapy

## 2015-03-26 ENCOUNTER — Telehealth: Payer: Self-pay

## 2015-03-26 NOTE — Telephone Encounter (Signed)
Amanda Morris would like pain medication for low back pain while doing PT. Please advise.

## 2015-03-26 NOTE — Telephone Encounter (Signed)
It looks like in the system she received pain medication about a month ago on October 21. I just want to clarify who wrote that for her and if she is continuing to see that provider before I give her pain medication.

## 2015-03-27 ENCOUNTER — Other Ambulatory Visit: Payer: Self-pay

## 2015-03-27 ENCOUNTER — Encounter: Payer: Self-pay | Admitting: Rehabilitative and Restorative Service Providers"

## 2015-03-27 ENCOUNTER — Encounter: Payer: Medicare Other | Admitting: Rehabilitative and Restorative Service Providers"

## 2015-03-27 ENCOUNTER — Ambulatory Visit (INDEPENDENT_AMBULATORY_CARE_PROVIDER_SITE_OTHER): Payer: Medicare Other | Admitting: Rehabilitative and Restorative Service Providers"

## 2015-03-27 DIAGNOSIS — R52 Pain, unspecified: Secondary | ICD-10-CM | POA: Diagnosis present

## 2015-03-27 DIAGNOSIS — R531 Weakness: Secondary | ICD-10-CM

## 2015-03-27 DIAGNOSIS — R293 Abnormal posture: Secondary | ICD-10-CM | POA: Diagnosis not present

## 2015-03-27 MED ORDER — TRAMADOL HCL 50 MG PO TABS: 50.0000 mg | ORAL_TABLET | Freq: Four times a day (QID) | ORAL | Status: DC | PRN

## 2015-03-27 NOTE — Patient Instructions (Signed)
Abdominal Bracing With Pelvic Floor (Hook-Lying)    With neutral spine, tighten pelvic floor and abdominals sucking belly button to back bone; tighten muscles in LB at waist. Hold 10 sec  Repeat _10__ times. Do _several__ times a day. Progress to do this sitting; standing; walking; working   Axial Extension (Chin Tuck)    Pull chin in and lengthen back of neck. Hold _10___ seconds while counting out loud. Repeat __3-5__ times. Do _several ___ sessions per day.   Strengthening: Hip Adduction - Isometric   Lying flat! Not sitting propped up  With ball or folded pillow between knees, squeeze knees together. Hold __10__ seconds. Repeat _10___ times per set. Do __2-3__ sessions per day.

## 2015-03-27 NOTE — Therapy (Signed)
[place], [place], [address] Phone: [phone]   Fax:  [phone]  Physical Therapy Treatment  Patient Details  Name: Amanda Morris MRN: [id number] Date of Birth: [birth year] Referring Provider: Dr Madilyn Fireman  Encounter Date: 03/27/2015      PT End of Session - 03/27/15 1614    Visit Number 2   Number of Visits 4   Date for PT Re-Evaluation 04/24/15   PT Start Time [id number]   PT Stop Time [id number]   PT Time Calculation (min) 41 min   Activity Tolerance Patient limited by pain;Patient tolerated treatment well      Past Medical History  Diagnosis Date  . Allergy   . Palpitations     Past Surgical History  Procedure Laterality Date  . Abdominal hysterectomy  1982  . Shoulder surgery  06/2008, 09/2010    Right, by Dr. Karie Soda, then Dr. Judeth Horn    There were no vitals filed for this visit.  Visit Diagnosis:  Generalized pain  Weakness generalized  Abnormal posture      Subjective Assessment - 03/27/15 1536    Subjective Pt reports some sharp pain in the back and around toward the buttocks - has been doing the exercises incorrectly. Finishes exercises and the pain "sets in"    Currently in Pain? Yes   Pain Score 10-Worst pain ever   Pain Location Back   Pain Orientation Lower   Pain Radiating Towards into buttocks and into both legs    Pain Onset More than a month ago   Pain Frequency Intermittent                         [facility] Adult PT Treatment/Exercise - 03/27/15 0001    Therapeutic Activites    Therapeutic Activities --  working on transitional mvts sit to supine and reverse   Exercises   Exercises Lumbar   Lumbar Exercises: Stretches   Passive Hamstring Stretch 2 reps;30 seconds   Lumbar Exercises: Supine   AB Set Limitations 3 part core 10 sec x 10    Other Supine Lumbar Exercises chin tuck x 3 sec x 10 reps; shoulder press x 3 sec x 10 reps; leg press x  3 sec x 5 reps each leg    Other Supine Lumbar Exercises hip adductor squeeze with ball in hookling 3-5 sec x 10    Modalities   Modalities Electrical Stimulation;Moist Heat   Moist Heat Therapy   Number Minutes Moist Heat 15 Minutes   Moist Heat Location Lumbar Spine   Electrical Stimulation   Electrical Stimulation Location bilat lumbar    Electrical Stimulation Action IFC   Electrical Stimulation Parameters to tolerance   Electrical Stimulation Goals Pain                PT Education - 03/27/15 1608    Education provided Yes   Education Details spine care; transitional movements; core contraction; HEP   Person(s) Educated Patient   Methods Explanation;Demonstration;Tactile cues;Verbal cues;Handout   Comprehension Verbalized understanding;Returned demonstration;Verbal cues required;Tactile cues required          PT Short Term Goals - 02/27/15 1712    PT SHORT TERM GOAL #1   Title I with initial HEP (03/27/15)   Time 4   Period Weeks   Status New   PT SHORT TERM GOAL #2   Title report pain decrease =/> 25% with transitioning sit to stand ( 03/27/15)  Time 4   Period Weeks   Status New   PT SHORT TERM GOAL #3   Title increase strength Rt hip =/> 4+/5 through out (03/27/15)    Time 4   Period Weeks   Status New           PT Long Term Goals - 02/27/15 1714    PT LONG TERM GOAL #1   Title I with advanced HEP (04/24/15)    Time 8   Period Weeks   Status New   PT LONG TERM GOAL #2   Title assume an upright posture without pain or difficulty (04/24/15)   Time 8   Period Weeks   Status New   PT LONG TERM GOAL #3   Title perform core strengthening with good contraction of TA and multifidi (04/24/15)    Time 8   Period Weeks   Status New   PT LONG TERM GOAL #4   Title improve FOTO =/> 45% limited , CK level (04/24/15)   Time 8   Period Weeks   Status New               Plan - 03/27/15 1614    Clinical Impression Statement Pt presents with  flare up of LBP and pain into the hips and LE's which she feels is in part due to exercises. Corrected exercises and suggested that she not work so hard at contractions with exercises. Difficulty with core exercise but improved with verbal and tactile cues. Pt responds well to e-stim reporting that she was pain free forllowing heat and stim today as she left the clinic. Suggested pt check on TENS unit for home and provided info of TENS unit. Difficult to affect change with PT only one time every other week.    Pt will benefit from skilled therapeutic intervention in order to improve on the following deficits Postural dysfunction;Decreased strength;Decreased mobility;Pain;Decreased activity tolerance;Difficulty walking   Rehab Potential Good   PT Frequency --  every other week    PT Treatment/Interventions Ultrasound;Traction;Patient/family education;Cryotherapy;Electrical Stimulation;Moist Heat;Therapeutic exercise;Manual techniques;Taping   PT Next Visit Plan progress core and LE strengthening  STW & modalities PRN    PT Home Exercise Plan rolling to come to sit and move sit to supine via sidelying; HEP    Consulted and Agree with Plan of Care Patient        Problem List Patient Active Problem List   Diagnosis Date Noted  . Essential hypertension 02/14/2015  . Pain in female pelvis 12/02/2014  . Postprandial vomiting 02/04/2014  . Obesity (BMI 30-39.9) 11/01/2013  . Carotid artery stenosis 06/07/2013  . Unspecified hereditary and idiopathic peripheral neuropathy 06/07/2013  . History of migraine 05/04/2013  . Diabetes mellitus without complication ([place]) 123456  . SVT (supraventricular tachycardia) ([place]) 11/03/2010  . GERD (gastroesophageal reflux disease) 10/29/2010  . Chronic low back pain 10/29/2010  . Other and unspecified hyperlipidemia 10/29/2010    Mikle Sternberg Nilda Simmer PT, MPH  03/27/2015, 6:10 PM  [facility] [place], [place], [address] Phone: [phone]   Fax:  [phone]  Name: Amanda Morris MRN: [id number] Date of Birth: 1950-08-05

## 2015-03-28 ENCOUNTER — Encounter: Payer: Medicare Other | Admitting: Physical Therapy

## 2015-03-31 ENCOUNTER — Other Ambulatory Visit: Payer: Self-pay | Admitting: Family Medicine

## 2015-03-31 MED ORDER — HYDROCODONE-ACETAMINOPHEN 5-325 MG PO TABS: 1.0000 | ORAL_TABLET | Freq: Every day | ORAL | Status: DC | PRN

## 2015-03-31 NOTE — Progress Notes (Signed)
Called and says the tramadol is not working.  NEeds something to take before PT.  Will refill her hydrocodone. Was given some in ED last month. Ok to pick up rx for 30 tabs.

## 2015-04-03 ENCOUNTER — Telehealth: Payer: Self-pay

## 2015-04-03 DIAGNOSIS — K449 Diaphragmatic hernia without obstruction or gangrene: Secondary | ICD-10-CM | POA: Diagnosis not present

## 2015-04-03 DIAGNOSIS — K219 Gastro-esophageal reflux disease without esophagitis: Secondary | ICD-10-CM | POA: Diagnosis not present

## 2015-04-03 NOTE — Telephone Encounter (Signed)
Kyrie called and reports the hydrocodone is not helping with the pain. She wants to know if Dr Madilyn Fireman has any other recommendations.

## 2015-04-04 NOTE — Telephone Encounter (Signed)
Patient had received the pain medication once from the hospital.  Patient says she is taking hydrocodone twice a day and its not helping much.  Advised patient to take tylenol in between and that she should only be taking hydrocodone as needed for moderate or severe pain.

## 2015-04-04 NOTE — Telephone Encounter (Signed)
Spoke with patient regarding pain issues.  Advised her to follow up with Dr Georgina Snell.  Appointment made.

## 2015-04-04 NOTE — Telephone Encounter (Signed)
If her pain is that severe maybe we should get her in with Dr. Georgina Snell to make sure we are not missing something.  See if she is ok with that. If so, please schedule her

## 2015-04-07 ENCOUNTER — Encounter: Payer: Self-pay | Admitting: Family Medicine

## 2015-04-07 ENCOUNTER — Ambulatory Visit (INDEPENDENT_AMBULATORY_CARE_PROVIDER_SITE_OTHER): Payer: Medicare Other | Admitting: Family Medicine

## 2015-04-07 VITALS — BP 143/92 | HR 101 | Wt 221.0 lb

## 2015-04-07 DIAGNOSIS — M4316 Spondylolisthesis, lumbar region: Secondary | ICD-10-CM

## 2015-04-07 DIAGNOSIS — Z981 Arthrodesis status: Secondary | ICD-10-CM | POA: Insufficient documentation

## 2015-04-07 DIAGNOSIS — G8929 Other chronic pain: Secondary | ICD-10-CM | POA: Diagnosis not present

## 2015-04-07 DIAGNOSIS — M545 Low back pain, unspecified: Secondary | ICD-10-CM

## 2015-04-07 DIAGNOSIS — I700 Atherosclerosis of aorta: Secondary | ICD-10-CM | POA: Diagnosis not present

## 2015-04-07 DIAGNOSIS — M5416 Radiculopathy, lumbar region: Secondary | ICD-10-CM | POA: Diagnosis not present

## 2015-04-07 DIAGNOSIS — I70299 Other atherosclerosis of native arteries of extremities, unspecified extremity: Secondary | ICD-10-CM

## 2015-04-07 MED ORDER — GABAPENTIN 300 MG PO CAPS: ORAL_CAPSULE | ORAL | Status: DC

## 2015-04-07 NOTE — Progress Notes (Signed)
Amanda Morris is a 64 y.o. female who presents to [facility] today for follow-up lumbago and radiculopathy.  Patient has had chronic back pain for some time now. However over the last several months is been worsening. Over the last month her back pain has become severe and is now radiating to the bilateral lateral lower legs. She denies any weakness or numbness bowel bladder dysfunction. She does that the pain is worse with extension and better with flexion. X-ray in August showed spondylolisthesis at L4-L5. She's had 2 sessions with physical therapy which have not helped much. She's tried tramadol and hydrocodone which have not helped much either.   Past Medical History  Diagnosis Date  . Allergy   . Palpitations    Past Surgical History  Procedure Laterality Date  . Abdominal hysterectomy  1982  . Shoulder surgery  06/2008, 09/2010    Right, by Dr. Karie Soda, then Dr. Judeth Horn   Social History  Substance Use Topics  . Smoking status: Former Smoker    Types: Cigarettes    Quit date: 07/30/2007  . Smokeless tobacco: Not on file  . Alcohol Use: No   family history includes Alcohol abuse in her brother; Diabetes in her mother and sister; Heart disease in her father, mother, and sister; Hyperlipidemia in her brother and sister; Hypertension in her mother and sister; Stroke in her mother and sister.  ROS as above Medications: Current Outpatient Prescriptions  Medication Sig Dispense Refill  . acetaZOLAMIDE (DIAMOX) 250 MG tablet Take 250 mg by mouth daily.    Marland Kitchen aspirin 81 MG tablet Take 81 mg by mouth daily.      Marland Kitchen dexlansoprazole (DEXILANT) 60 MG capsule Take 60 mg by mouth daily.    . fluticasone (FLONASE) 50 MCG/ACT nasal spray INHALE 2 SPRAYS IN EACH NOSTRIL EACH DAY 16 g 11  . glipiZIDE (GLUCOTROL) 10 MG tablet TAKE 1 TABLET (10 MG TOTAL) BY MOUTH 2 (TWO) TIMES DAILY BEFORE A MEAL. 180 tablet 1  . HYDROcodone-acetaminophen (NORCO/VICODIN)  5-325 MG tablet Take 1-2 tablets by mouth daily as needed for moderate pain or severe pain. 30 tablet 0  . JANUVIA 100 MG tablet TAKE 1 TABLET (100 MG TOTAL) BY MOUTH DAILY. 90 tablet 1  . naproxen sodium (ANAPROX) 220 MG tablet Take 220 mg by mouth 2 (two) times daily with a meal.    . perphenazine-amitriptyline (ETRAFON/TRIAVIL) 2-25 MG TABS Take 1 tablet by mouth 3 (three) times daily.  3  . pravastatin (PRAVACHOL) 40 MG tablet TAKE 1 TABLET (40 MG TOTAL) BY MOUTH DAILY. 90 tablet 0  . sitaGLIPtin (JANUVIA) 100 MG tablet TAKE 1 TABLET (100 MG TOTAL) BY MOUTH DAILY. 90 tablet 1  . traMADol (ULTRAM) 50 MG tablet Take 1-2 tablets (50-100 mg total) by mouth every 6 (six) hours as needed. As need before Physical Therapy 30 tablet 0  . gabapentin (NEURONTIN) 300 MG capsule One tab PO qHS for a week, then BID for a week, then TID. May double weekly to a max of 3,600mg /day 180 capsule 3   No current facility-administered medications for this visit.   Allergies  Allergen Reactions  . Metformin And Related Other (See Comments)    Nausea and diarrhea.   . Penicillins Hives     Exam:  BP 143/92 mmHg  Pulse 101  Wt 221 lb (100.245 kg) Gen: Well NAD in pain appearing Back: Nontender Range of motion normal flexion and. Extension due to pain. Strength is intact. Patient  is able to stand on toes heels and squat. She walks with an antalgic gait.   X-ray lumbar spine August 2016 reviewed  No results found for this or any previous visit (from the past 24 hour(s)). No results found.   Please see individual assessment and plan sections.

## 2015-04-07 NOTE — Patient Instructions (Signed)
Thank you for coming in today. Come back or go to the emergency room if you notice new weakness new numbness problems walking or bowel or bladder problems. Return following MRI.

## 2015-04-07 NOTE — Assessment & Plan Note (Signed)
Spondylolisthesis with radiculopathy. Failed conservative management. Plan for MRI. Additionally treat with gabapentin. Follow-up after MRI.

## 2015-04-08 ENCOUNTER — Telehealth: Payer: Self-pay

## 2015-04-08 NOTE — Telephone Encounter (Signed)
I dont have anything more potent yet. Titrate the gabapentin dose like we discussed. We will get MRI to figure out what is wrong.

## 2015-04-08 NOTE — Telephone Encounter (Signed)
Amanda Morris states the gabapentin and hydrocodone is not helping with the pain. She would like something else for the pain. Please advise.

## 2015-04-09 NOTE — Telephone Encounter (Signed)
Pt returned clinic call, states the gabapentin is not helping. Will route.

## 2015-04-09 NOTE — Telephone Encounter (Signed)
Left message advising of recommendations.  

## 2015-04-10 ENCOUNTER — Ambulatory Visit (INDEPENDENT_AMBULATORY_CARE_PROVIDER_SITE_OTHER): Payer: Medicare Other | Admitting: Rehabilitative and Restorative Service Providers"

## 2015-04-10 ENCOUNTER — Encounter: Payer: Self-pay | Admitting: Rehabilitative and Restorative Service Providers"

## 2015-04-10 DIAGNOSIS — R52 Pain, unspecified: Secondary | ICD-10-CM

## 2015-04-10 DIAGNOSIS — R531 Weakness: Secondary | ICD-10-CM | POA: Diagnosis not present

## 2015-04-10 DIAGNOSIS — R293 Abnormal posture: Secondary | ICD-10-CM

## 2015-04-10 NOTE — Therapy (Addendum)
[facility] [address] [place], [place], [address] Phone: [phone]   Fax:  [phone]  Physical Therapy Treatment  Patient Details  Name: Amanda Morris MRN: [id number] Date of Birth: 10/26/1950 Referring Provider: Dr. Metheney  Encounter Date: 04/10/2015      PT End of Session - 04/10/15 1512    Visit Number 3   Number of Visits 4   Date for PT Re-Evaluation 04/24/15   PT Start Time 1515   PT Stop Time 1608   PT Time Calculation (min) 53 min   Activity Tolerance Patient limited by pain;Patient tolerated treatment well      Past Medical History  Diagnosis Date  . Allergy   . Palpitations     Past Surgical History  Procedure Laterality Date  . Abdominal hysterectomy  1982  . Shoulder surgery  06/2008, 09/2010    Right, by Dr. Richie, then Dr. Laughenburger    There were no vitals filed for this visit.  Visit Diagnosis:  Generalized pain  Weakness generalized  Abnormal posture      Subjective Assessment - 04/10/15 1517    Subjective Patient reports that she continues to have LBP at about the same level. She has been doing her exercises but feels that she is doing them too hard - or too many. Her niece is checking on the TENS unit for her.  Dr. Corey will do an xray Monday 04/14/15 to see if she has a "bone laying on that nerve " in her back.    Currently in Pain? Yes   Pain Score 10-Worst pain ever   Pain Location Back   Pain Orientation Lower   Pain Descriptors / Indicators Dull   Pain Type Chronic pain   Pain Radiating Towards into both buttocks and legs    Pain Onset More than a month ago   Pain Frequency Constant   Aggravating Factors  sit to stand; bending; changing positions; getting out bed in the moring   Pain Relieving Factors nothing when it gets bad             [facility] PT Assessment - 04/10/15 0001    Assessment   Medical Diagnosis anterolithesis   Referring Provider Dr.  Metheney   Onset Date/Surgical Date 01/27/15   Next MD Visit yearly visit or as needed   Prior Therapy not for low back   Strength   Strength Assessment Site Hip   Right/Left Hip Right;Left   Right Hip Flexion 4+/5   Right Hip Extension 4/5   Right Hip External Rotation  4-/5   Right Hip ABduction 4-/5   Left Hip Flexion 4+/5   Left Hip Extension 4+/5   Left Hip ABduction 4-/5   Flexibility   Hamstrings Rt 67 deg; Lt 78 deg    Palpation   SI assessment  pain with grade II PA mobs   Palpation comment very tender in bilat lumbar paraspinals and upper gluts, Rt QL                     [facility] Adult PT Treatment/Exercise - 04/10/15 0001    Ambulation/Gait   Gait Pattern Shuffle;Trunk flexed;Decreased step length - left;Decreased step length - right   Posture/Postural Control   Postural Limitations Rounded Shoulders;Forward head;Increased thoracic kyphosis;Decreased lumbar lordosis   Exercises   Exercises Lumbar   Lumbar Exercises: Stretches   Passive Hamstring Stretch 2 reps;30 seconds   Lumbar Exercises: Aerobic   Stationary Bike Nustep   L5 x5 min slowly to pt's tolerance    Lumbar Exercises: Supine   AB Set Limitations 3 part core 10 sec x 10    Other Supine Lumbar Exercises chin tuck x 3 sec x 10 reps; shoulder press x 3 sec x 10 reps; leg press x 3 sec x 5 reps each leg    Other Supine Lumbar Exercises hip adductor squeeze with ball in hookling 3-5 sec x 10; alt shd flexion in supine with core engaged x10; hooklying marching x 5 with core engaged - began to have soe pain with 5 reps     Lumbar Exercises: Prone   Other Prone Lumbar Exercises pelvic press 5 sec hold x 10 ; alt knee flexion with pelvic press x 10    Modalities   Modalities Electrical Stimulation;Moist Heat   Moist Heat Therapy   Number Minutes Moist Heat 15 Minutes   Moist Heat Location Lumbar Spine   Electrical Stimulation   Electrical Stimulation Location bilat lumbar    Electrical Stimulation  Action IFC   Electrical Stimulation Parameters to tolerance   Electrical Stimulation Goals Pain   Manual Therapy   Soft tissue mobilization bilat lumbar spine into buttocks    Myofascial Release lumbar spine/buttocks                 PT Education - 04/10/15 1603    Education provided Yes   Education Details HEP   Methods Explanation;Demonstration;Tactile cues;Verbal cues;Handout   Comprehension Returned demonstration;Verbalized understanding;Verbal cues required;Tactile cues required          PT Short Term Goals - 04/10/15 1607    PT SHORT TERM GOAL #1   Title I with initial HEP (03/27/15)   Time 4   Period Weeks   Status Achieved   PT SHORT TERM GOAL #2   Title report pain decrease =/> 25% with transitioning sit to stand ( 03/27/15)    Time 4   Period Weeks   Status On-going   PT SHORT TERM GOAL #3   Title increase strength Rt hip =/> 4+/5 through out (03/27/15)    Time 4   Period Weeks   Status On-going           PT Long Term Goals - 04/10/15 1607    PT LONG TERM GOAL #1   Title I with advanced HEP (04/24/15)    Time 8   Period Weeks   Status On-going   PT LONG TERM GOAL #2   Title assume an upright posture without pain or difficulty (04/24/15)   Time 8   Period Weeks   Status On-going   PT LONG TERM GOAL #3   Title perform core strengthening with good contraction of TA and multifidi (04/24/15)    Time 8   Period Weeks   Status On-going   PT LONG TERM GOAL #4   Title improve FOTO =/> 45% limited , CK level (04/24/15)   Time 8   Period Weeks   Status On-going               Plan - 04/10/15 1603    Clinical Impression Statement Amanda Morris has continued pain in LB radiating into bilat hips and LE's. She has not noted any change in symptoms since beginning therapy. She reports that she is doing her exercises at home now. Pt responds well to e-stim and heat in the clinic and her niece is checking on a TENS unit for her to use at home. Strength is  unchanged and pain with palpation continues with goals of therpay not met to date. We can continue PT as indicated.    Pt will benefit from skilled therapeutic intervention in order to improve on the following deficits Postural dysfunction;Decreased strength;Decreased mobility;Pain;Decreased activity tolerance;Difficulty walking   Rehab Potential Good   PT Frequency --  every other week   PT Duration 8 weeks   PT Treatment/Interventions Ultrasound;Traction;Patient/family education;Cryotherapy;Electrical Stimulation;Moist Heat;Therapeutic exercise;Manual techniques;Taping   PT Next Visit Plan progress core and LE strengthening  STW & modalities PRN as tolerated   PT Home Exercise Plan rolling to come to sit and move sit to supine via sidelying; HEP    Consulted and Agree with Plan of Care Patient        Problem List Patient Active Problem List   Diagnosis Date Noted  . Spondylolisthesis at L4-L5 level 04/07/2015  . Radiculopathy, lumbar region 04/07/2015  . Essential hypertension 02/14/2015  . Pain in female pelvis 12/02/2014  . Postprandial vomiting 02/04/2014  . Obesity (BMI 30-39.9) 11/01/2013  . Carotid artery stenosis 06/07/2013  . Unspecified hereditary and idiopathic peripheral neuropathy 06/07/2013  . History of migraine 05/04/2013  . Diabetes mellitus without complication ([place]) 70/96/2836  . SVT (supraventricular tachycardia) ([place]) 11/03/2010  . GERD (gastroesophageal reflux disease) 10/29/2010  . Chronic low back pain 10/29/2010  . Other and unspecified hyperlipidemia 10/29/2010    Celyn Nilda Simmer PT, MPH  04/10/2015, 4:09 PM  [facility] [place] [address] [place], [place], [address] Phone: [phone]   Fax:  [phone]  Name: Amanda Morris MRN: [id number] Date of Birth: 1951/02/02    PHYSICAL THERAPY DISCHARGE SUMMARY  Visits from Start of Care: 3  Current functional level related to goals / functional  outcomes: Continued pain - to schedule additional tests and see specialist.    Remaining deficits: unchanged   Education / Equipment: HEP  Plan: Patient agrees to discharge.  Patient goals were not met. Patient is being discharged due to not returning since the last visit.  ?????    Celyn P. Helene Kelp PT, MPH 05/02/2015 8:30 AM

## 2015-04-10 NOTE — Telephone Encounter (Signed)
I dont have any other medicines yet because we do not know what is wrong. Get MRI and return.

## 2015-04-10 NOTE — Patient Instructions (Signed)
Pelvic Press    Place hands under belly between navel and pubic bone, palms up. Feel pressure on hands. Increase pressure on hands by pressing pelvis down. This is NOT a pelvic tilt. Hold _5__ seconds. Relax. Repeat _10__ times.  Same position as above pressing pubic bone down, then bend one knee and then the other. 5-10 x each leg.    Lying on back with hips and knees bend. Tighten core. Raise one arm up by ear and then the other. 10 on each arm.  Lying on back with hips and knees bent. Tighten core. March one knee up and then the other. 10 on each leg.

## 2015-04-11 NOTE — Telephone Encounter (Signed)
Left detailed message that we cannot give any stronger medication until MRI results.

## 2015-04-14 ENCOUNTER — Ambulatory Visit (INDEPENDENT_AMBULATORY_CARE_PROVIDER_SITE_OTHER): Payer: Medicare Other

## 2015-04-14 DIAGNOSIS — M4316 Spondylolisthesis, lumbar region: Secondary | ICD-10-CM

## 2015-04-14 DIAGNOSIS — M545 Low back pain, unspecified: Secondary | ICD-10-CM

## 2015-04-14 DIAGNOSIS — M5416 Radiculopathy, lumbar region: Secondary | ICD-10-CM

## 2015-04-14 DIAGNOSIS — M4806 Spinal stenosis, lumbar region: Secondary | ICD-10-CM | POA: Diagnosis not present

## 2015-04-14 DIAGNOSIS — G8929 Other chronic pain: Secondary | ICD-10-CM

## 2015-04-15 NOTE — Progress Notes (Signed)
Quick Note:  MRI shows nerve impingement. Return to discuss plan injection vs surgery. ______

## 2015-04-17 ENCOUNTER — Ambulatory Visit (INDEPENDENT_AMBULATORY_CARE_PROVIDER_SITE_OTHER): Payer: Medicare Other | Admitting: Family Medicine

## 2015-04-17 ENCOUNTER — Encounter: Payer: Self-pay | Admitting: Family Medicine

## 2015-04-17 VITALS — BP 154/103 | HR 89 | Wt 221.0 lb

## 2015-04-17 DIAGNOSIS — M545 Low back pain, unspecified: Secondary | ICD-10-CM

## 2015-04-17 DIAGNOSIS — M4316 Spondylolisthesis, lumbar region: Secondary | ICD-10-CM

## 2015-04-17 DIAGNOSIS — G8929 Other chronic pain: Secondary | ICD-10-CM

## 2015-04-17 DIAGNOSIS — M5416 Radiculopathy, lumbar region: Secondary | ICD-10-CM

## 2015-04-17 DIAGNOSIS — I70299 Other atherosclerosis of native arteries of extremities, unspecified extremity: Secondary | ICD-10-CM | POA: Diagnosis not present

## 2015-04-17 DIAGNOSIS — I700 Atherosclerosis of aorta: Secondary | ICD-10-CM | POA: Diagnosis not present

## 2015-04-17 MED ORDER — OXYCODONE-ACETAMINOPHEN 10-325 MG PO TABS: 1.0000 | ORAL_TABLET | Freq: Three times a day (TID) | ORAL | Status: DC | PRN

## 2015-04-17 MED ORDER — GABAPENTIN 300 MG PO CAPS: 600.0000 mg | ORAL_CAPSULE | Freq: Three times a day (TID) | ORAL | Status: DC

## 2015-04-17 NOTE — Progress Notes (Signed)
Amanda Morris is a 64 y.o. female who presents to [place]: Primary Care today for follow-up back pain. Patient has had chronic low back pain worsening recently. She's had a trial of physical therapy that did not go well. She notes worsening radicular pain to her thighs and lateral calves as well. She was in MRI that showed worsening spondylolisthesis, spinal stenosis, and nerve encroachment and impingement at L4-L5 bilaterally. She notes that in the interval she has had worsening pain despite gabapentin and hydrocodone. Additionally she notes new weakness. She is collapsed and fallen several times because of the weakness. She denies any bowel or bladder problems. 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 Her last day that she worked was December 20.   Past Medical History  Diagnosis Date  . Allergy   . Palpitations    Past Surgical History  Procedure Laterality Date  . Abdominal hysterectomy  1982  . Shoulder surgery  06/2008, 09/2010    Right, by Dr. Karie Soda, then Dr. Judeth Horn   Social History  Substance Use Topics  . Smoking status: Former Smoker    Types: Cigarettes    Quit date: 07/30/2007  . Smokeless tobacco: Not on file  . Alcohol Use: No   family history includes Alcohol abuse in her brother; Diabetes in her mother and sister; Heart disease in her father, mother, and sister; Hyperlipidemia in her brother and sister; Hypertension in her mother and sister; Stroke in her mother and sister.  ROS as above Medications: Current Outpatient Prescriptions  Medication Sig Dispense Refill  . acetaZOLAMIDE (DIAMOX) 250 MG tablet Take 250 mg by mouth daily.    Marland Kitchen aspirin 81 MG tablet Take 81 mg by mouth daily.      Marland Kitchen dexlansoprazole (DEXILANT) 60 MG capsule Take 60 mg by mouth daily.    . fluticasone (FLONASE) 50 MCG/ACT nasal spray INHALE 2 SPRAYS IN EACH NOSTRIL EACH DAY 16 g 11  .  gabapentin (NEURONTIN) 300 MG capsule Take 2 capsules (600 mg total) by mouth 3 (three) times daily. 180 capsule 3  . glipiZIDE (GLUCOTROL) 10 MG tablet TAKE 1 TABLET (10 MG TOTAL) BY MOUTH 2 (TWO) TIMES DAILY BEFORE A MEAL. 180 tablet 1  . JANUVIA 100 MG tablet TAKE 1 TABLET (100 MG TOTAL) BY MOUTH DAILY. 90 tablet 1  . naproxen sodium (ANAPROX) 220 MG tablet Take 220 mg by mouth 2 (two) times daily with a meal.    . perphenazine-amitriptyline (ETRAFON/TRIAVIL) 2-25 MG TABS Take 1 tablet by mouth 3 (three) times daily.  3  . pravastatin (PRAVACHOL) 40 MG tablet TAKE 1 TABLET (40 MG TOTAL) BY MOUTH DAILY. 90 tablet 0  . sitaGLIPtin (JANUVIA) 100 MG tablet TAKE 1 TABLET (100 MG TOTAL) BY MOUTH DAILY. 90 tablet 1  . traMADol (ULTRAM) 50 MG tablet Take 1-2 tablets (50-100 mg total) by mouth every 6 (six) hours as needed. As need before Physical Therapy 30 tablet 0  . oxyCODONE-acetaminophen (PERCOCET) 10-325 MG tablet Take 1 tablet by mouth every 8 (eight) hours as needed for pain. 60 tablet 0   No current facility-administered medications for this visit.   Allergies  Allergen Reactions  . Metformin And Related Other (See Comments)    Nausea and diarrhea.   . Penicillins Hives     Exam:  BP 154/103 mmHg  Pulse 89  Wt 221 lb (100.245 kg) Gen: Well NAD Back: Nontender to spinal midline. Diffusely tender in the  lumbar paraspinals and SI joints. Back range of motion is significantly limited by pain. Patient has intact flexion but significantly limited extension.  Lower extremity strength is diminished. Patient has intact 5/5 hip flexion bilaterally. Hip abduction BL 4/5. Hip adduction 4/5 BL Knee extension 4/5 BL Knee flexion 5/5 BL Ankle plantar and dorsiflexion 5/5 BL Sensation intact throughout.    MRI Lspine reviewed with patient.   No results found for this or any previous visit (from the past 24 hour(s)). No results found.   Please see individual assessment and plan  sections.

## 2015-04-17 NOTE — Assessment & Plan Note (Signed)
Patient has pain in her back along with radiating pain and now new onset weakness related to spondylolisthesis, spinal stenosis, and nerve encroachment. Plan for simultaneous referral for epidural steroid injection in consultation with neurosurgery. I'm concerned about her new onset weakness. Lengthy discussion about red flag signs or symptoms. Increase gabapentin to 600 mg 3 times daily. Increase pain control to oxycodone 10/325. Return PRN

## 2015-04-17 NOTE — Patient Instructions (Signed)
Thank you for coming in today. We will arrange a neurosurgery and injection in [place]. Return after consultation. Return sooner or call back sooner if you have problems. Come back or go to the emergency room if you notice new weakness new numbness problems walking or bowel or bladder problems.

## 2015-04-19 ENCOUNTER — Other Ambulatory Visit: Payer: Self-pay | Admitting: Family Medicine

## 2015-04-23 ENCOUNTER — Institutional Professional Consult (permissible substitution): Payer: Medicare Other | Admitting: Family Medicine

## 2015-04-24 ENCOUNTER — Encounter: Payer: Medicare Other | Admitting: Physical Therapy

## 2015-04-24 ENCOUNTER — Encounter: Payer: Self-pay | Admitting: Family Medicine

## 2015-04-24 ENCOUNTER — Ambulatory Visit (INDEPENDENT_AMBULATORY_CARE_PROVIDER_SITE_OTHER): Payer: Medicare Other | Admitting: Family Medicine

## 2015-04-24 VITALS — BP 125/86 | HR 109 | Wt 221.0 lb

## 2015-04-24 DIAGNOSIS — M4316 Spondylolisthesis, lumbar region: Secondary | ICD-10-CM

## 2015-04-24 DIAGNOSIS — M545 Low back pain, unspecified: Secondary | ICD-10-CM

## 2015-04-24 DIAGNOSIS — I700 Atherosclerosis of aorta: Secondary | ICD-10-CM

## 2015-04-24 DIAGNOSIS — G8929 Other chronic pain: Secondary | ICD-10-CM | POA: Diagnosis not present

## 2015-04-24 DIAGNOSIS — M5416 Radiculopathy, lumbar region: Secondary | ICD-10-CM

## 2015-04-24 DIAGNOSIS — I70299 Other atherosclerosis of native arteries of extremities, unspecified extremity: Secondary | ICD-10-CM

## 2015-04-24 MED ORDER — OXYCODONE-ACETAMINOPHEN 10-325 MG PO TABS: 0.5000 | ORAL_TABLET | Freq: Two times a day (BID) | ORAL | Status: DC | PRN
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
## 2015-04-24 NOTE — Patient Instructions (Signed)
Thank you for coming in today. Return after neurosurgery visit.  We will try to get you in to the [place] location earlier.  Come back or go to the emergency room if you notice new weakness new numbness problems walking or bowel or bladder problems.

## 2015-04-24 NOTE — Assessment & Plan Note (Signed)
Follow-up with neurosurgery. Return sooner as needed.

## 2015-05-02 NOTE — Addendum Note (Signed)
Addended by: Everardo All on: 05/02/2015 08:28 AM   Modules accepted: Orders

## 2015-05-13 DIAGNOSIS — Z6837 Body mass index (BMI) 37.0-37.9, adult: Secondary | ICD-10-CM | POA: Diagnosis not present

## 2015-05-13 DIAGNOSIS — M4726 Other spondylosis with radiculopathy, lumbar region: Secondary | ICD-10-CM | POA: Diagnosis not present

## 2015-05-13 DIAGNOSIS — M4316 Spondylolisthesis, lumbar region: Secondary | ICD-10-CM | POA: Diagnosis not present

## 2015-05-15 DIAGNOSIS — Z7984 Long term (current) use of oral hypoglycemic drugs: Secondary | ICD-10-CM | POA: Diagnosis not present

## 2015-05-15 DIAGNOSIS — H524 Presbyopia: Secondary | ICD-10-CM | POA: Diagnosis not present

## 2015-05-15 DIAGNOSIS — H5203 Hypermetropia, bilateral: Secondary | ICD-10-CM | POA: Diagnosis not present

## 2015-05-15 DIAGNOSIS — H52223 Regular astigmatism, bilateral: Secondary | ICD-10-CM | POA: Diagnosis not present

## 2015-05-15 DIAGNOSIS — E119 Type 2 diabetes mellitus without complications: Secondary | ICD-10-CM | POA: Diagnosis not present

## 2015-05-15 LAB — HM DIABETES EYE EXAM

## 2015-05-16 ENCOUNTER — Ambulatory Visit (INDEPENDENT_AMBULATORY_CARE_PROVIDER_SITE_OTHER): Payer: Medicare HMO | Admitting: Family Medicine

## 2015-05-16 ENCOUNTER — Encounter: Payer: Self-pay | Admitting: Family Medicine

## 2015-05-16 VITALS — BP 141/89 | HR 102 | Wt 207.0 lb

## 2015-05-16 DIAGNOSIS — E119 Type 2 diabetes mellitus without complications: Secondary | ICD-10-CM

## 2015-05-16 DIAGNOSIS — I10 Essential (primary) hypertension: Secondary | ICD-10-CM

## 2015-05-16 DIAGNOSIS — M5416 Radiculopathy, lumbar region: Secondary | ICD-10-CM | POA: Diagnosis not present

## 2015-05-16 DIAGNOSIS — M4316 Spondylolisthesis, lumbar region: Secondary | ICD-10-CM

## 2015-05-16 LAB — POCT GLYCOSYLATED HEMOGLOBIN (HGB A1C): Hemoglobin A1C: 7.2

## 2015-05-16 MED ORDER — GLIPIZIDE 10 MG PO TABS: ORAL_TABLET | ORAL | Status: DC

## 2015-05-16 NOTE — Progress Notes (Signed)
   Subjective:    Patient ID: Amanda Morris, female    DOB: 04-29-1950, 65 y.o.   MRN: [id number]  HPI Diabetes - no hypoglycemic events. No wounds or sores that are not healing well. No increased thirst or urination. Checking glucose at home. Taking medications as prescribed without any side effects. She has been eating cookies. She has not been able to exercise because of back pain. In fact she saw the neurosurgeon for consult and they're discussing doing surgery.  Spondylolisthesis at L4-5 level with lumbar radiculopathy-she has been referred to Dr. Cyndy Freeze. She met with him earlier this week but he still needs the images before scheduling her surgery.  Hypertension- Pt denies chest pain, SOB, dizziness, or heart palpitations.  Taking meds as directed w/o problems.  Denies medication side effects.      Review of Systems     Objective:   Physical Exam  Constitutional: She is oriented to person, place, and time. She appears well-developed and well-nourished.  HENT:  Head: Normocephalic and atraumatic.  Cardiovascular: Normal rate, regular rhythm and normal heart sounds.   Pulmonary/Chest: Effort normal and breath sounds normal.  Neurological: She is alert and oriented to person, place, and time.  Skin: Skin is warm and dry.  Psychiatric: She has a normal mood and affect. Her behavior is normal.    She is using a walker today       Assessment & Plan:  DM- uncontrolled. Her A1c is up to 7.2 today. She admits she's been eating a lot of cookies this and we discussed getting back on track with diet.  Spondylolisthesis/lumbar radiculopathy-we'll try to call his office on Monday. It may just be that she needs to get some images burned on a CD and taken to his office to get scheduled for surgery. It's not quite clear from the conversation today. We will call his office to clarify.  Hypertension-blood pressure up a little bit today but looked great about 2 weeks ago. We'll just continue  to monitor. She has lost some weight which is fantastic.

## 2015-05-20 ENCOUNTER — Ambulatory Visit (INDEPENDENT_AMBULATORY_CARE_PROVIDER_SITE_OTHER): Payer: Medicare Other | Admitting: Family Medicine

## 2015-05-20 ENCOUNTER — Telehealth: Payer: Self-pay

## 2015-05-20 ENCOUNTER — Encounter: Payer: Self-pay | Admitting: Family Medicine

## 2015-05-20 VITALS — BP 133/72 | HR 88 | Wt 223.0 lb

## 2015-05-20 DIAGNOSIS — M4316 Spondylolisthesis, lumbar region: Secondary | ICD-10-CM

## 2015-05-20 NOTE — Progress Notes (Signed)
Patient accidentally scheduled an appointment with me today and attempt to get an image CD made after her recent MRI. She has no new complaints. The appointment was canceled and patient was instructed how to get a CD made and the imaging department downstairs. She will follow-up with her neurosurgeon. No charge for today's visit.

## 2015-05-20 NOTE — Telephone Encounter (Signed)
Please call the neurosurgeon that we referred her to. She saw him but he said he needed additional information. I'm not sure if this is records from office or if she just needs to go downstairs and get a copy of images burned onto a CD. I think she is confused so if we can call and clarify what they need that would be very helpful. I believe the surgeon actually comes to our location downstairs.

## 2015-05-20 NOTE — Telephone Encounter (Signed)
Trishna called and states Dr Madilyn Fireman was going to call her on Monday. Not clear about why she was going to call. Please advise.

## 2015-05-28 ENCOUNTER — Encounter: Payer: Self-pay | Admitting: Family Medicine

## 2015-05-29 ENCOUNTER — Other Ambulatory Visit: Payer: Self-pay | Admitting: Neurosurgery

## 2015-06-05 ENCOUNTER — Other Ambulatory Visit: Payer: Self-pay

## 2015-06-05 ENCOUNTER — Encounter (HOSPITAL_COMMUNITY)
Admission: RE | Admit: 2015-06-05 | Discharge: 2015-06-05 | Disposition: A | Discharge status: Home / Self Care | Payer: Medicare Other | Source: Ambulatory Visit | Attending: Neurosurgery | Admitting: Neurosurgery

## 2015-06-05 ENCOUNTER — Encounter (HOSPITAL_COMMUNITY): Payer: Self-pay

## 2015-06-05 DIAGNOSIS — K219 Gastro-esophageal reflux disease without esophagitis: Secondary | ICD-10-CM | POA: Insufficient documentation

## 2015-06-05 DIAGNOSIS — Z7984 Long term (current) use of oral hypoglycemic drugs: Secondary | ICD-10-CM | POA: Diagnosis not present

## 2015-06-05 DIAGNOSIS — Z01812 Encounter for preprocedural laboratory examination: Secondary | ICD-10-CM | POA: Diagnosis not present

## 2015-06-05 DIAGNOSIS — Z01818 Encounter for other preprocedural examination: Secondary | ICD-10-CM | POA: Diagnosis not present

## 2015-06-05 DIAGNOSIS — Z87891 Personal history of nicotine dependence: Secondary | ICD-10-CM | POA: Insufficient documentation

## 2015-06-05 DIAGNOSIS — M4316 Spondylolisthesis, lumbar region: Secondary | ICD-10-CM | POA: Insufficient documentation

## 2015-06-05 DIAGNOSIS — Z79899 Other long term (current) drug therapy: Secondary | ICD-10-CM | POA: Insufficient documentation

## 2015-06-05 DIAGNOSIS — Z0183 Encounter for blood typing: Secondary | ICD-10-CM | POA: Insufficient documentation

## 2015-06-05 DIAGNOSIS — E119 Type 2 diabetes mellitus without complications: Secondary | ICD-10-CM | POA: Diagnosis not present

## 2015-06-05 DIAGNOSIS — Z7982 Long term (current) use of aspirin: Secondary | ICD-10-CM | POA: Diagnosis not present

## 2015-06-05 HISTORY — DX: Gastro-esophageal reflux disease without esophagitis: K21.9

## 2015-06-05 HISTORY — DX: Headache: R51

## 2015-06-05 HISTORY — DX: Headache, unspecified: R51.9

## 2015-06-05 HISTORY — DX: Personal history of other diseases of the digestive system: Z87.19

## 2015-06-05 HISTORY — DX: Type 2 diabetes mellitus without complications: E11.9

## 2015-06-05 LAB — TYPE AND SCREEN
ABO/RH(D): B NEG
Antibody Screen: NEGATIVE

## 2015-06-05 LAB — BASIC METABOLIC PANEL
Anion gap: 8 (ref 5–15)
BUN: 6 mg/dL (ref 6–20)
CO2: 27 mmol/L (ref 22–32)
Calcium: 8.6 mg/dL — ABNORMAL LOW (ref 8.9–10.3)
Chloride: 107 mmol/L (ref 101–111)
Creatinine, Ser: 0.77 mg/dL (ref 0.44–1.00)
GFR calc Af Amer: >60 mL/min (ref >60)
GFR calc non Af Amer: >60 mL/min (ref >60)
Glucose, Bld: 161 mg/dL — ABNORMAL HIGH (ref 65–99)
Potassium: 3.8 mmol/L (ref 3.5–5.1)
Sodium: 142 mmol/L (ref 135–145)

## 2015-06-05 LAB — CBC
HCT: 38.8 % (ref 36.0–46.0)
Hemoglobin: 12 g/dL (ref 12.0–15.0)
MCH: 23.8 pg — ABNORMAL LOW (ref 26.0–34.0)
MCHC: 30.9 g/dL (ref 30.0–36.0)
MCV: 76.8 fL — ABNORMAL LOW (ref 78.0–100.0)
Platelets: 265 10*3/uL (ref 150–400)
RBC: 5.05 MIL/uL (ref 3.87–5.11)
RDW: 15.4 % (ref 11.5–15.5)
WBC: 9 10*3/uL (ref 4.0–10.5)

## 2015-06-05 LAB — ABO/RH: ABO/RH(D): B NEG

## 2015-06-05 LAB — SURGICAL PCR SCREEN
MRSA, PCR: NEGATIVE
Staphylococcus aureus: POSITIVE — AB

## 2015-06-05 LAB — GLUCOSE, CAPILLARY: Glucose-Capillary: 161 mg/dL — ABNORMAL HIGH (ref 65–99)

## 2015-06-05 NOTE — Progress Notes (Signed)
EKG sent to Myra Gianotti PA for review

## 2015-06-05 NOTE — Progress Notes (Signed)
Cardiologist: none  Primary care: Beatrice Lecher, MD Jule Ser)  Pt denies ECHO, Stress, Heart cath   CXR: 10/16 Care Everywhere  EKG 06/05/15

## 2015-06-06 ENCOUNTER — Other Ambulatory Visit (HOSPITAL_COMMUNITY): Payer: Medicare Other

## 2015-06-06 LAB — HEMOGLOBIN A1C
Hgb A1c MFr Bld: 7.5 % — ABNORMAL HIGH (ref 4.8–5.6)
Mean Plasma Glucose: 169 mg/dL

## 2015-06-06 NOTE — Progress Notes (Signed)
I called a prescription for Mupirocin ointment to [facility], [place], [place], [place]

## 2015-06-06 NOTE — Progress Notes (Signed)
Anesthesia Chart Review:  Pt is a 65 year old female scheduled for L4-5 PLIF on 06/13/15 with Dr. Christella Noa.   PMH includes:  DM, palpitations, GERD. Former smoker. BMI 38  Medications include: ASA, dexilant, glipizide, pravastatin, perphenazine-amitriptyline, sitagliptin.   Preoperative labs reviewed.  Glucose 161, hgbA1c 7.5  EKG 06/05/15: NSR. Possible Left atrial enlargement. Possible Inferior infarct, age undetermined. Cannot rule out Anterior infarct, age undetermined. Appears similar to EKG dated 05/10/07  Carotid duplex US 06/01/13: B ICA stenosis 40-59%  Reviewed case with Dr. Kalman Shan.   If no changes, I anticipate pt can proceed with surgery as scheduled.   Willeen Cass, FNP-BC [facility]/Anesthesiology Phone: [phone] 06/06/2015 12:39 PM

## 2015-06-12 MED ORDER — VANCOMYCIN HCL 10 G IV SOLR
1500.0000 mg | INTRAVENOUS | Status: AC
  Administered 2015-06-13: 1500 mg via INTRAVENOUS
  Filled 2015-06-12: qty 1500

## 2015-06-13 ENCOUNTER — Inpatient Hospital Stay (HOSPITAL_COMMUNITY): Payer: Medicare Other | Admitting: Emergency Medicine

## 2015-06-13 ENCOUNTER — Encounter (HOSPITAL_COMMUNITY): Payer: Self-pay | Admitting: *Deleted

## 2015-06-13 ENCOUNTER — Inpatient Hospital Stay (HOSPITAL_COMMUNITY): Payer: Medicare Other | Admitting: Anesthesiology

## 2015-06-13 ENCOUNTER — Encounter (HOSPITAL_COMMUNITY)
Admission: RE | Disposition: A | Discharge status: Skilled Nursing Facility | Payer: Self-pay | Source: Ambulatory Visit | Attending: Neurosurgery

## 2015-06-13 ENCOUNTER — Inpatient Hospital Stay (HOSPITAL_COMMUNITY): Payer: Medicare Other

## 2015-06-13 ENCOUNTER — Inpatient Hospital Stay (HOSPITAL_COMMUNITY)
Admission: RE | Admit: 2015-06-13 | Discharge: 2015-06-18 | DRG: 460 | Disposition: A | Discharge status: Skilled Nursing Facility | Payer: Medicare Other | Source: Ambulatory Visit | Attending: Neurosurgery | Admitting: Neurosurgery

## 2015-06-13 DIAGNOSIS — Z419 Encounter for procedure for purposes other than remedying health state, unspecified: Secondary | ICD-10-CM

## 2015-06-13 DIAGNOSIS — M4806 Spinal stenosis, lumbar region: Principal | ICD-10-CM | POA: Diagnosis present

## 2015-06-13 DIAGNOSIS — R278 Other lack of coordination: Secondary | ICD-10-CM | POA: Diagnosis not present

## 2015-06-13 DIAGNOSIS — M549 Dorsalgia, unspecified: Secondary | ICD-10-CM | POA: Diagnosis not present

## 2015-06-13 DIAGNOSIS — E119 Type 2 diabetes mellitus without complications: Secondary | ICD-10-CM | POA: Diagnosis present

## 2015-06-13 DIAGNOSIS — M4316 Spondylolisthesis, lumbar region: Secondary | ICD-10-CM | POA: Diagnosis present

## 2015-06-13 DIAGNOSIS — Z4789 Encounter for other orthopedic aftercare: Secondary | ICD-10-CM | POA: Diagnosis not present

## 2015-06-13 DIAGNOSIS — M6281 Muscle weakness (generalized): Secondary | ICD-10-CM | POA: Diagnosis not present

## 2015-06-13 DIAGNOSIS — Z6838 Body mass index (BMI) 38.0-38.9, adult: Secondary | ICD-10-CM

## 2015-06-13 DIAGNOSIS — I10 Essential (primary) hypertension: Secondary | ICD-10-CM | POA: Diagnosis present

## 2015-06-13 DIAGNOSIS — Z87891 Personal history of nicotine dependence: Secondary | ICD-10-CM | POA: Diagnosis not present

## 2015-06-13 DIAGNOSIS — M4306 Spondylolysis, lumbar region: Secondary | ICD-10-CM | POA: Diagnosis not present

## 2015-06-13 DIAGNOSIS — K449 Diaphragmatic hernia without obstruction or gangrene: Secondary | ICD-10-CM | POA: Diagnosis present

## 2015-06-13 DIAGNOSIS — M4326 Fusion of spine, lumbar region: Secondary | ICD-10-CM | POA: Diagnosis not present

## 2015-06-13 DIAGNOSIS — K219 Gastro-esophageal reflux disease without esophagitis: Secondary | ICD-10-CM | POA: Diagnosis present

## 2015-06-13 DIAGNOSIS — R2681 Unsteadiness on feet: Secondary | ICD-10-CM | POA: Diagnosis not present

## 2015-06-13 LAB — GLUCOSE, CAPILLARY
Glucose-Capillary: 213 mg/dL — ABNORMAL HIGH (ref 65–99)
Glucose-Capillary: 160 mg/dL — ABNORMAL HIGH (ref 65–99)
Glucose-Capillary: 211 mg/dL — ABNORMAL HIGH (ref 65–99)
Glucose-Capillary: 249 mg/dL — ABNORMAL HIGH (ref 65–99)
Glucose-Capillary: 231 mg/dL — ABNORMAL HIGH (ref 65–99)

## 2015-06-13 SURGERY — POSTERIOR LUMBAR FUSION 1 LEVEL
Anesthesia: General | Site: Spine Lumbar

## 2015-06-13 MED ORDER — ROCURONIUM BROMIDE 100 MG/10ML IV SOLN
INTRAVENOUS | Status: DC | PRN
  Administered 2015-06-13: 50 mg via INTRAVENOUS
  Administered 2015-06-13: 10 mg via INTRAVENOUS
  Administered 2015-06-13: 15 mg via INTRAVENOUS
  Administered 2015-06-13: 10 mg via INTRAVENOUS

## 2015-06-13 MED ORDER — ONDANSETRON HCL 4 MG/2ML IJ SOLN
INTRAMUSCULAR | Status: AC
  Filled 2015-06-13: qty 2

## 2015-06-13 MED ORDER — MORPHINE SULFATE (PF) 2 MG/ML IV SOLN
1.0000 mg | INTRAVENOUS | Status: DC | PRN
  Administered 2015-06-13: 2 mg via INTRAVENOUS
  Filled 2015-06-13: qty 1

## 2015-06-13 MED ORDER — POTASSIUM CHLORIDE IN NACL 20-0.9 MEQ/L-% IV SOLN
INTRAVENOUS | Status: DC
  Administered 2015-06-13 – 2015-06-14 (×2): via INTRAVENOUS
  Filled 2015-06-13 (×2): qty 1000

## 2015-06-13 MED ORDER — SODIUM CHLORIDE 0.9 % IJ SOLN
INTRAMUSCULAR | Status: AC
  Filled 2015-06-13: qty 10

## 2015-06-13 MED ORDER — FENTANYL CITRATE (PF) 250 MCG/5ML IJ SOLN
INTRAMUSCULAR | Status: AC
  Filled 2015-06-13: qty 5

## 2015-06-13 MED ORDER — 0.9 % SODIUM CHLORIDE (POUR BTL) OPTIME
TOPICAL | Status: DC | PRN
  Administered 2015-06-13: 1000 mL

## 2015-06-13 MED ORDER — PHENYLEPHRINE HCL 10 MG/ML IJ SOLN
INTRAMUSCULAR | Status: DC | PRN
  Administered 2015-06-13 (×4): 40 ug via INTRAVENOUS

## 2015-06-13 MED ORDER — SODIUM CHLORIDE 0.9% FLUSH
3.0000 mL | Freq: Two times a day (BID) | INTRAVENOUS | Status: DC
  Administered 2015-06-13 – 2015-06-17 (×3): 3 mL via INTRAVENOUS

## 2015-06-13 MED ORDER — PHENOL 1.4 % MT LIQD: 1.0000 | OROMUCOSAL | Status: DC | PRN

## 2015-06-13 MED ORDER — SURGIFOAM 100 EX MISC
CUTANEOUS | Status: DC | PRN
  Administered 2015-06-13: 09:00:00 via TOPICAL

## 2015-06-13 MED ORDER — PERPHENAZINE-AMITRIPTYLINE 2-25 MG PO TABS
1.0000 | ORAL_TABLET | Freq: Three times a day (TID) | ORAL | Status: DC
  Administered 2015-06-13 – 2015-06-18 (×16): 1 via ORAL
  Filled 2015-06-13 (×19): qty 1

## 2015-06-13 MED ORDER — LIDOCAINE HCL (CARDIAC) 20 MG/ML IV SOLN
INTRAVENOUS | Status: AC
  Filled 2015-06-13: qty 5

## 2015-06-13 MED ORDER — PANTOPRAZOLE SODIUM 40 MG PO TBEC
40.0000 mg | DELAYED_RELEASE_TABLET | Freq: Every day | ORAL | Status: DC
  Administered 2015-06-13 – 2015-06-18 (×6): 40 mg via ORAL
  Filled 2015-06-13 (×6): qty 1

## 2015-06-13 MED ORDER — OXYCODONE HCL 5 MG PO TABS
5.0000 mg | ORAL_TABLET | Freq: Once | ORAL | Status: AC | PRN
  Administered 2015-06-13: 5 mg via ORAL

## 2015-06-13 MED ORDER — GLIPIZIDE 5 MG PO TABS
10.0000 mg | ORAL_TABLET | Freq: Two times a day (BID) | ORAL | Status: DC
  Administered 2015-06-13 – 2015-06-18 (×11): 10 mg via ORAL
  Filled 2015-06-13 (×11): qty 2

## 2015-06-13 MED ORDER — MIDAZOLAM HCL 2 MG/2ML IJ SOLN
INTRAMUSCULAR | Status: AC
  Filled 2015-06-13: qty 2

## 2015-06-13 MED ORDER — MENTHOL 3 MG MT LOZG: 1.0000 | LOZENGE | OROMUCOSAL | Status: DC | PRN

## 2015-06-13 MED ORDER — HYDROMORPHONE HCL 1 MG/ML IJ SOLN: 0.2500 mg | INTRAMUSCULAR | Status: DC | PRN

## 2015-06-13 MED ORDER — DEXAMETHASONE SODIUM PHOSPHATE 4 MG/ML IJ SOLN
INTRAMUSCULAR | Status: AC
  Filled 2015-06-13: qty 2

## 2015-06-13 MED ORDER — PROMETHAZINE HCL 25 MG/ML IJ SOLN: 6.2500 mg | INTRAMUSCULAR | Status: DC | PRN

## 2015-06-13 MED ORDER — ARTIFICIAL TEARS OP OINT
TOPICAL_OINTMENT | OPHTHALMIC | Status: DC | PRN
  Administered 2015-06-13: 1 via OPHTHALMIC

## 2015-06-13 MED ORDER — OXYCODONE HCL 5 MG/5ML PO SOLN: 5.0000 mg | Freq: Once | ORAL | Status: AC | PRN

## 2015-06-13 MED ORDER — FENTANYL CITRATE (PF) 100 MCG/2ML IJ SOLN
INTRAMUSCULAR | Status: DC | PRN
  Administered 2015-06-13 (×5): 50 ug via INTRAVENOUS
  Administered 2015-06-13: 100 ug via INTRAVENOUS
  Administered 2015-06-13: 50 ug via INTRAVENOUS

## 2015-06-13 MED ORDER — LINAGLIPTIN 5 MG PO TABS
5.0000 mg | ORAL_TABLET | Freq: Every day | ORAL | Status: DC
  Administered 2015-06-13 – 2015-06-18 (×6): 5 mg via ORAL
  Filled 2015-06-13 (×6): qty 1

## 2015-06-13 MED ORDER — NEOSTIGMINE METHYLSULFATE 10 MG/10ML IV SOLN
INTRAVENOUS | Status: DC | PRN
  Administered 2015-06-13: 4 mg via INTRAVENOUS

## 2015-06-13 MED ORDER — ROCURONIUM BROMIDE 50 MG/5ML IV SOLN
INTRAVENOUS | Status: AC
  Filled 2015-06-13: qty 1

## 2015-06-13 MED ORDER — OXYCODONE HCL 5 MG PO TABS
ORAL_TABLET | ORAL | Status: AC
  Filled 2015-06-13: qty 1

## 2015-06-13 MED ORDER — PHENYLEPHRINE 40 MCG/ML (10ML) SYRINGE FOR IV PUSH (FOR BLOOD PRESSURE SUPPORT)
PREFILLED_SYRINGE | INTRAVENOUS | Status: AC
  Filled 2015-06-13: qty 10

## 2015-06-13 MED ORDER — LIDOCAINE HCL (CARDIAC) 20 MG/ML IV SOLN
INTRAVENOUS | Status: DC | PRN
  Administered 2015-06-13: 100 mg via INTRAVENOUS

## 2015-06-13 MED ORDER — DIAZEPAM 5 MG PO TABS
5.0000 mg | ORAL_TABLET | Freq: Four times a day (QID) | ORAL | Status: DC | PRN
  Administered 2015-06-13 – 2015-06-16 (×4): 5 mg via ORAL
  Filled 2015-06-13 (×6): qty 1

## 2015-06-13 MED ORDER — DEXAMETHASONE SODIUM PHOSPHATE 10 MG/ML IJ SOLN
INTRAMUSCULAR | Status: DC | PRN
  Administered 2015-06-13: 8 mg via INTRAVENOUS

## 2015-06-13 MED ORDER — OXYCODONE-ACETAMINOPHEN 5-325 MG PO TABS
1.0000 | ORAL_TABLET | ORAL | Status: DC | PRN
  Administered 2015-06-13: 0.5 via ORAL
  Administered 2015-06-14: 2 via ORAL
  Administered 2015-06-14 (×2): 1 via ORAL
  Administered 2015-06-15 – 2015-06-18 (×5): 2 via ORAL
  Filled 2015-06-13: qty 2
  Filled 2015-06-13 (×2): qty 1
  Filled 2015-06-13 (×6): qty 2

## 2015-06-13 MED ORDER — EPHEDRINE SULFATE 50 MG/ML IJ SOLN
INTRAMUSCULAR | Status: AC
  Filled 2015-06-13: qty 1

## 2015-06-13 MED ORDER — PROPOFOL 10 MG/ML IV BOLUS
INTRAVENOUS | Status: DC | PRN
  Administered 2015-06-13: 100 mg via INTRAVENOUS
  Administered 2015-06-13: 50 mg via INTRAVENOUS

## 2015-06-13 MED ORDER — ONDANSETRON HCL 4 MG/2ML IJ SOLN
4.0000 mg | INTRAMUSCULAR | Status: DC | PRN
  Administered 2015-06-13 (×2): 4 mg via INTRAVENOUS
  Filled 2015-06-13 (×2): qty 2

## 2015-06-13 MED ORDER — PRAVASTATIN SODIUM 40 MG PO TABS
40.0000 mg | ORAL_TABLET | Freq: Every day | ORAL | Status: DC
  Administered 2015-06-13 – 2015-06-18 (×6): 40 mg via ORAL
  Filled 2015-06-13 (×6): qty 1

## 2015-06-13 MED ORDER — ACETAMINOPHEN 650 MG RE SUPP: 650.0000 mg | RECTAL | Status: DC | PRN

## 2015-06-13 MED ORDER — GLYCOPYRROLATE 0.2 MG/ML IJ SOLN
INTRAMUSCULAR | Status: DC | PRN
  Administered 2015-06-13: 0.6 mg via INTRAVENOUS

## 2015-06-13 MED ORDER — ACETAMINOPHEN 325 MG PO TABS
650.0000 mg | ORAL_TABLET | ORAL | Status: DC | PRN
  Administered 2015-06-16 – 2015-06-17 (×2): 650 mg via ORAL
  Filled 2015-06-13 (×3): qty 2

## 2015-06-13 MED ORDER — GABAPENTIN 300 MG PO CAPS
600.0000 mg | ORAL_CAPSULE | Freq: Three times a day (TID) | ORAL | Status: DC
  Administered 2015-06-13 – 2015-06-18 (×16): 600 mg via ORAL
  Filled 2015-06-13 (×16): qty 2

## 2015-06-13 MED ORDER — HYDROCODONE-ACETAMINOPHEN 5-325 MG PO TABS
1.0000 | ORAL_TABLET | ORAL | Status: DC | PRN
  Administered 2015-06-14: 2 via ORAL
  Filled 2015-06-13: qty 2

## 2015-06-13 MED ORDER — MIDAZOLAM HCL 5 MG/5ML IJ SOLN
INTRAMUSCULAR | Status: DC | PRN
  Administered 2015-06-13: 2 mg via INTRAVENOUS

## 2015-06-13 MED ORDER — SUCCINYLCHOLINE CHLORIDE 20 MG/ML IJ SOLN
INTRAMUSCULAR | Status: AC
  Filled 2015-06-13: qty 1

## 2015-06-13 MED ORDER — LIDOCAINE-EPINEPHRINE 0.5 %-1:200000 IJ SOLN
INTRAMUSCULAR | Status: DC | PRN
  Administered 2015-06-13: 10 mL

## 2015-06-13 MED ORDER — PERPHENAZINE-AMITRIPTYLINE 2-25 MG PO TABS: 1.0000 | ORAL_TABLET | Freq: Three times a day (TID) | ORAL | Status: DC

## 2015-06-13 MED ORDER — BUPIVACAINE HCL (PF) 0.5 % IJ SOLN
INTRAMUSCULAR | Status: DC | PRN
  Administered 2015-06-13: 30 mL

## 2015-06-13 MED ORDER — ZOLPIDEM TARTRATE 5 MG PO TABS: 5.0000 mg | ORAL_TABLET | Freq: Every evening | ORAL | Status: DC | PRN

## 2015-06-13 MED ORDER — SODIUM CHLORIDE 0.9 % IV SOLN: 250.0000 mL | INTRAVENOUS | Status: DC

## 2015-06-13 MED ORDER — SODIUM CHLORIDE 0.9% FLUSH: 3.0000 mL | INTRAVENOUS | Status: DC | PRN

## 2015-06-13 MED ORDER — LACTATED RINGERS IV SOLN
INTRAVENOUS | Status: DC | PRN
  Administered 2015-06-13 (×2): via INTRAVENOUS

## 2015-06-13 MED ORDER — ASPIRIN EC 81 MG PO TBEC
81.0000 mg | DELAYED_RELEASE_TABLET | Freq: Every day | ORAL | Status: DC
  Administered 2015-06-13 – 2015-06-18 (×6): 81 mg via ORAL
  Filled 2015-06-13 (×6): qty 1

## 2015-06-13 MED ORDER — ONDANSETRON HCL 4 MG/2ML IJ SOLN
INTRAMUSCULAR | Status: DC | PRN
  Administered 2015-06-13: 4 mg via INTRAVENOUS

## 2015-06-13 MED ORDER — PROPOFOL 10 MG/ML IV BOLUS
INTRAVENOUS | Status: AC
  Filled 2015-06-13: qty 20

## 2015-06-13 SURGICAL SUPPLY — 62 items
BENZOIN TINCTURE PRP APPL 2/3 (GAUZE/BANDAGES/DRESSINGS) IMPLANT
BIT DRILL PLIF MAS DISP 5.5MM (DRILL) ×1 IMPLANT
BLADE CLIPPER SURG (BLADE) IMPLANT
BUR MATCHSTICK NEURO 3.0 LAGG (BURR) ×2 IMPLANT
BUR PRECISION FLUTE 5.0 (BURR) ×2 IMPLANT
CANISTER SUCT 3000ML PPV (MISCELLANEOUS) ×2 IMPLANT
CONT SPEC 4OZ CLIKSEAL STRL BL (MISCELLANEOUS) ×2 IMPLANT
COVER BACK TABLE 60X90IN (DRAPES) ×2 IMPLANT
DECANTER SPIKE VIAL GLASS SM (MISCELLANEOUS) ×2 IMPLANT
DRAPE C-ARM 42X72 X-RAY (DRAPES) ×4 IMPLANT
DRAPE C-ARMOR (DRAPES) IMPLANT
DRAPE LAPAROTOMY 100X72X124 (DRAPES) ×2 IMPLANT
DRAPE POUCH INSTRU U-SHP 10X18 (DRAPES) ×2 IMPLANT
DRAPE SURG 17X23 STRL (DRAPES) ×2 IMPLANT
DRILL PLIF MAS DISP 5.5MM (DRILL) ×2
DRSG OPSITE POSTOP 4X8 (GAUZE/BANDAGES/DRESSINGS) ×2 IMPLANT
DURAPREP 26ML APPLICATOR (WOUND CARE) ×2 IMPLANT
ELECT BLADE 4.0 EZ CLEAN MEGAD (MISCELLANEOUS) ×2
ELECT REM PT RETURN 9FT ADLT (ELECTROSURGICAL) ×2
ELECTRODE BLDE 4.0 EZ CLN MEGD (MISCELLANEOUS) ×1 IMPLANT
ELECTRODE REM PT RTRN 9FT ADLT (ELECTROSURGICAL) ×1 IMPLANT
GAUZE SPONGE 4X4 12PLY STRL (GAUZE/BANDAGES/DRESSINGS) IMPLANT
GAUZE SPONGE 4X4 16PLY XRAY LF (GAUZE/BANDAGES/DRESSINGS) IMPLANT
GLOVE ECLIPSE 6.5 STRL STRAW (GLOVE) ×4 IMPLANT
GLOVE EXAM NITRILE LRG STRL (GLOVE) IMPLANT
GLOVE EXAM NITRILE MD LF STRL (GLOVE) IMPLANT
GLOVE EXAM NITRILE XL STR (GLOVE) IMPLANT
GLOVE EXAM NITRILE XS STR PU (GLOVE) IMPLANT
GOWN STRL REUS W/ TWL LRG LVL3 (GOWN DISPOSABLE) ×2 IMPLANT
GOWN STRL REUS W/ TWL XL LVL3 (GOWN DISPOSABLE) IMPLANT
GOWN STRL REUS W/TWL 2XL LVL3 (GOWN DISPOSABLE) IMPLANT
GOWN STRL REUS W/TWL LRG LVL3 (GOWN DISPOSABLE) ×2
GOWN STRL REUS W/TWL XL LVL3 (GOWN DISPOSABLE)
KIT BASIN OR (CUSTOM PROCEDURE TRAY) ×2 IMPLANT
KIT POSITION SURG JACKSON T1 (MISCELLANEOUS) ×2 IMPLANT
KIT ROOM TURNOVER OR (KITS) ×2 IMPLANT
LIQUID BAND (GAUZE/BANDAGES/DRESSINGS) ×2 IMPLANT
MILL MEDIUM DISP (BLADE) ×2 IMPLANT
NEEDLE HYPO 25X1 1.5 SAFETY (NEEDLE) ×2 IMPLANT
NEEDLE SPNL 18GX3.5 QUINCKE PK (NEEDLE) IMPLANT
NS IRRIG 1000ML POUR BTL (IV SOLUTION) ×2 IMPLANT
PACK LAMINECTOMY NEURO (CUSTOM PROCEDURE TRAY) ×2 IMPLANT
PAD ARMBOARD 7.5X6 YLW CONV (MISCELLANEOUS) ×4 IMPLANT
ROD 35MM (Rod) ×2 IMPLANT
ROD 5.5X40MM (Rod) ×2 IMPLANT
SCREW LOCK (Screw) ×4 IMPLANT
SCREW LOCK FXNS SPNE MAS PL (Screw) ×4 IMPLANT
SCREW SHANKS 5.5X35 (Screw) ×8 IMPLANT
SCREW TULIP 5.5 (Screw) ×8 IMPLANT
SPACER OPAL 10X24 12MM (Spacer) ×4 IMPLANT
SPONGE LAP 4X18 X RAY DECT (DISPOSABLE) IMPLANT
SPONGE SURGIFOAM ABS GEL 100 (HEMOSTASIS) ×2 IMPLANT
STRIP CLOSURE SKIN 1/2X4 (GAUZE/BANDAGES/DRESSINGS) IMPLANT
SUT PROLENE 6 0 BV (SUTURE) IMPLANT
SUT VIC AB 0 CT1 18XCR BRD8 (SUTURE) ×1 IMPLANT
SUT VIC AB 0 CT1 8-18 (SUTURE) ×1
SUT VIC AB 2-0 CT1 18 (SUTURE) ×2 IMPLANT
SUT VIC AB 3-0 SH 8-18 (SUTURE) ×2 IMPLANT
TOWEL OR 17X24 6PK STRL BLUE (TOWEL DISPOSABLE) ×2 IMPLANT
TOWEL OR 17X26 10 PK STRL BLUE (TOWEL DISPOSABLE) ×2 IMPLANT
TRAY FOLEY W/METER SILVER 14FR (SET/KITS/TRAYS/PACK) ×2 IMPLANT
WATER STERILE IRR 1000ML POUR (IV SOLUTION) ×2 IMPLANT

## 2015-06-13 NOTE — Addendum Note (Signed)
Addendum  created 06/13/15 1428 by Inda Coke, CRNA   Modules edited: Anesthesia Flowsheet

## 2015-06-13 NOTE — Op Note (Signed)
06/13/2015  11:59 AM  PATIENT:  Amanda Morris  65 y.o. 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, lateral recess stenosis  PROCEDURE:  Procedure(s): LUMBAR FOUR-FIVE POSTERIOR LUMBAR Interbody FUSION L4/5,  2 12x55mm Peek cages(Synthes), autograft morsels in both the cages and disc space Lumbar nerve roots 4, 5 decompression via complete L4 laminectomy, L5 hemilaminectomy, L4 inferior facetectomies Non segmental pedicle screw fixation L4, L5 Nuvasive mas plif instrumentation  SURGEON:  Surgeon(s): Ashok Pall, MD Kristeen Miss, MD  ASSISTANTS:Elsner, Mallie Mussel  ANESTHESIA:   general  EBL:  Total I/O In: 1700 [I.V.:1700] Out: 550 [Urine:300; Blood:250]  BLOOD ADMINISTERED:none  CELL SAVER GIVEN:none  COUNT:per nursing  DRAINS: none   SPECIMEN:  No Specimen  DICTATION: Amanda Morris is a 65 y.o. female whom was taken to the operating room intubated, and placed under a general anesthetic without difficulty. A foley catheter was placed under sterile conditions. She was positioned prone on a Jackson stable with all pressure points properly padded.  Her lumbar region was prepped and draped in a sterile manner. I infiltrated 20cc's 1/2%lidocaine/1:2000,000 strength epinephrine into the planned incision. I opened the skin with a 10 blade and took the incision down to the thoracolumbar fascia. I exposed the lamina of L3,4, and 5 in a subperiosteal fashion bilaterally. I confirmed my location with an intraoperative xray.  I placed self retaining retractors and started the decompression.  I decompressed the spinal canal bia a complete laminectomy of L4, partial laminectomy of L5, and L4 bilateral inferior facetectomies. This allowed for complete decompression of the L4 roots through the neural foramina. The ligamentum flavum was thickened,  and certainly a cause of nerve root compression also. I followed the nerve roots out of the canal with Kerrison punches. The exposure for the decompression wAs far beyond the necessary exposure to perform the PLIF.  PLIF's were performed at L4/5 in the same fashion. I opened the disc space with a 15 blade then used a variety of instruments to remove the disc and prepare the space for the arthrodesis. I used curettes, rongeurs, punches, shavers for the disc space, and rasps in the discetomy. I measured the disc space and placed 2 12x23mm   Peek cages(Synthes opal) into the disc space(s). The cages and the disc space were packed with autograft morsels. We placed pedicle screws at L4, and L5, using fluoroscopic guidance. I drilled a pilot hole, then cannulated the pedicle with a drill at each site. We then tapped each pedicle, assessing each site for pedicle violations. No cutouts were appreciated. Screws (Nuvasive mas plif) were then placed at each site without difficulty. Final films were performed and all screws appeared to be in good position. I connected the screw heads to each screw. The rods were placed in the screw heads and secured with locking caps. This completed the construct.  I closed the wound in a layered fashion. I approximated the thoracolumbar fascia, subcutaneous, and subcuticular planes with vicryl sutures. I used dermabond, and an occlusive bandage for a sterile dressing.     PLAN OF CARE: Admit to inpatient   PATIENT DISPOSITION:  PACU - hemodynamically stable.   Delay start of Pharmacological VTE agent (>24hrs) due to surgical blood loss or risk of bleeding:  yes

## 2015-06-13 NOTE — Progress Notes (Signed)
Patient admitted from PACU. Patient alert and oriented x 4. Patient oriented to room and made comfortable. 

## 2015-06-13 NOTE — Transfer of Care (Signed)
Immediate Anesthesia Transfer of Care Note  Patient: Amanda Morris  Procedure(s) Performed: Procedure(s) with comments: LUMBAR FOUR-FIVE POSTERIOR LUMBAR FUSION  (N/A) - L45 posterior lumbar interbody fusion with interbody prosthesis posterior lateral arthrodesis and posterior nonsegmental instrumentation  Patient Location: PACU  Anesthesia Type:General  Level of Consciousness: awake  Airway & Oxygen Therapy: Patient Spontanous Breathing and Patient connected to nasal cannula oxygen  Post-op Assessment: Report given to RN, Post -op Vital signs reviewed and stable and Patient moving all extremities X 4  Post vital signs: stable  Last Vitals:  Filed Vitals:   06/13/15 0603 06/13/15 1204  BP: 125/77 115/67  Pulse: 98   Temp: 37.3 C 36.6 C  Resp: 18     Complications: No apparent anesthesia complications
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
## 2015-06-13 NOTE — Anesthesia Postprocedure Evaluation (Signed)
Anesthesia Post Note  Patient: Amanda Morris  Procedure(s) Performed: Procedure(s) (LRB): LUMBAR FOUR-FIVE POSTERIOR LUMBAR FUSION  (N/A)  Patient location during evaluation: PACU Anesthesia Type: General Level of consciousness: awake and alert Pain management: pain level controlled Vital Signs Assessment: post-procedure vital signs reviewed and stable Respiratory status: spontaneous breathing, nonlabored ventilation and respiratory function stable Cardiovascular status: blood pressure returned to baseline and stable Postop Assessment: no signs of nausea or vomiting Anesthetic complications: no    Last Vitals:  Filed Vitals:   06/13/15 1315 06/13/15 1330  BP: 116/81 116/82  Pulse: 94 92  Temp:    Resp: 28 21    Last Pain:  Filed Vitals:   06/13/15 1336  PainSc: Asleep    LLE Motor Response: Purposeful movement (06/13/15 1330)   RLE Motor Response: Purposeful movement (06/13/15 1330)        Zenaida Deed

## 2015-06-13 NOTE — Progress Notes (Signed)
Utilization review completed.  

## 2015-06-13 NOTE — Anesthesia Procedure Notes (Signed)
Procedure Name: Intubation Date/Time: 06/13/2015 7:50 AM Performed by: Rejeana Brock L Pre-anesthesia Checklist: Patient identified, Timeout performed, Emergency Drugs available, Suction available and Patient being monitored Patient Re-evaluated:Patient Re-evaluated prior to inductionOxygen Delivery Method: Circle system utilized Preoxygenation: Pre-oxygenation with 100% oxygen Intubation Type: IV induction Ventilation: Mask ventilation without difficulty Laryngoscope Size: Mac and 3 Grade View: Grade I Tube type: Oral Tube size: 7.0 mm Number of attempts: 1 Airway Equipment and Method: Stylet Placement Confirmation: ETT inserted through vocal cords under direct vision,  breath sounds checked- equal and bilateral and positive ETCO2 Secured at: 22 cm Tube secured with: Tape Dental Injury: Teeth and Oropharynx as per pre-operative assessment

## 2015-06-13 NOTE — Anesthesia Preprocedure Evaluation (Addendum)
Anesthesia Evaluation  Patient identified by MRN, date of birth, ID band Patient awake    Reviewed: Allergy & Precautions, H&P , NPO status , Patient's Chart, lab work & pertinent test results  History of Anesthesia Complications Negative for: history of anesthetic complications  Airway Mallampati: I  TM Distance: >3 FB Neck ROM: full    Dental  (+) Poor Dentition, Missing   Pulmonary former smoker,    Pulmonary exam normal breath sounds clear to auscultation       Cardiovascular hypertension, Normal cardiovascular exam Rhythm:regular Rate:Normal     Neuro/Psych  Headaches,    GI/Hepatic Neg liver ROS, hiatal hernia, GERD  ,  Endo/Other  diabetesMorbid obesity  Renal/GU negative Renal ROS     Musculoskeletal   Abdominal   Peds  Hematology negative hematology ROS (+)   Anesthesia Other Findings   Reproductive/Obstetrics negative OB ROS                           Anesthesia Physical Anesthesia Plan  ASA: III  Anesthesia Plan: General   Post-op Pain Management:    Induction: Intravenous  Airway Management Planned: Oral ETT  Additional Equipment: None  Intra-op Plan:   Post-operative Plan: Extubation in OR  Informed Consent: I have reviewed the patients History and Physical, chart, labs and discussed the procedure including the risks, benefits and alternatives for the proposed anesthesia with the patient or authorized representative who has indicated his/her understanding and acceptance.   Dental Advisory Given  Plan Discussed with: Anesthesiologist, CRNA and Surgeon  Anesthesia Plan Comments: (Given chronic pain would treat with ketamine following induction, 1mg /kg total prior to incision)       Anesthesia Quick Evaluation

## 2015-06-14 LAB — GLUCOSE, CAPILLARY
Glucose-Capillary: 242 mg/dL — ABNORMAL HIGH (ref 65–99)
Glucose-Capillary: 212 mg/dL — ABNORMAL HIGH (ref 65–99)
Glucose-Capillary: 216 mg/dL — ABNORMAL HIGH (ref 65–99)
Glucose-Capillary: 207 mg/dL — ABNORMAL HIGH (ref 65–99)

## 2015-06-14 MED ORDER — OXYCODONE HCL 5 MG PO TABS
10.0000 mg | ORAL_TABLET | ORAL | Status: DC | PRN
  Administered 2015-06-14 – 2015-06-17 (×7): 10 mg via ORAL
  Filled 2015-06-14 (×7): qty 2

## 2015-06-14 NOTE — Evaluation (Signed)
Occupational Therapy Evaluation Patient Details Name: Amanda Morris MRN: [id number] DOB: April 30, 1950 Today's Date: 06/14/2015    History of Present Illness 65 y.o. s/p L4-5 posterior lumbar fusion. Medical history includes tinnitus, nose bleeds, nasal congestion, sinus headache, sore throat, hypercholesterolemia, leg pain with walking, abdominal pain, arm weakness, leg weakness, back pain, arm pain, leg pain, diabetes, GERD, palpitations   Clinical Impression   Pt s/p above. Pt independent with ADLs, PTA. Feel pt will benefit from acute OT to increase independence prior to d/c. Recommending HHOT. Reports niece will be there to assist intermittently.    Follow Up Recommendations  Home health OT;Supervision - Intermittent    Equipment Recommendations  3 in 1 bedside comode;Other (comment) (AE if wanted)    Recommendations for Other Services       Precautions / Restrictions Precautions Precautions: Back;Fall Precaution Booklet Issued: No Precaution Comments: educated on back precautions Required Braces or Orthoses: Spinal Brace Spinal Brace: Lumbar corset;Applied in sitting position Restrictions Weight Bearing Restrictions: No      Mobility Bed Mobility Overal bed mobility: Needs Assistance Bed Mobility: Rolling;Sidelying to Sit Rolling: Supervision Sidelying to sit: Supervision       General bed mobility comments: cues for technique  Transfers Overall transfer level: Needs assistance   Transfers: Sit to/from Stand Sit to Stand: Min assist              Balance      Hand held assist and IV pole used for ambulation.                                      ADL Overall ADL's : Needs assistance/impaired                 Upper Body Dressing : Moderate assistance;Sitting   Lower Body Dressing: Maximal assistance;Sit to/from stand Lower Body Dressing Details (indicate cue type and reason): without breaking precautions Toilet Transfer:  Minimal assistance;Ambulation (sit to stand from bed)           Functional mobility during ADLs: Minimal assistance (used IV pole and also used hand held assist) General ADL Comments: Pt able to cross legs over knees, but appeared to be breaking back precautions. Briefly talked about AE. Assisted with back brace. Instructed pt not to sit longer than 45 min-1 hour.      Vision     Perception     Praxis      Pertinent Vitals/Pain Pain Assessment: 0-10 Pain Score: 10-Worst pain ever Pain Location: back and hips Pain Intervention(s): Monitored during session;Repositioned     Hand Dominance     Extremity/Trunk Assessment Upper Extremity Assessment Upper Extremity Assessment: RUE deficits/detail RUE Deficits / Details: reports baseline weakness in RUE   Lower Extremity Assessment Lower Extremity Assessment: Defer to PT evaluation       Communication Communication Communication: No difficulties   Cognition Arousal/Alertness: Awake/alert Behavior During Therapy: WFL for tasks assessed/performed Overall Cognitive Status: Within Functional Limits for tasks assessed                     General Comments       Exercises       Shoulder Instructions      Home Living Family/patient expects to be discharged to:: Private residence Living Arrangements: Alone Available Help at Discharge: Family;Available PRN/intermittently (niece) Type of Home: Apartment Home Access: Stairs to enter [employer] of Steps:  1 Entrance Stairs-Rails: None Home Layout: One level     Bathroom Shower/Tub: Tub/shower unit         Home Equipment: Walker - 2 wheels          Prior Functioning/Environment Level of Independence: Independent with assistive device(s)        Comments: used RW    OT Diagnosis: Acute pain   OT Problem List: Decreased strength;Decreased activity tolerance;Impaired balance (sitting and/or standing);Decreased knowledge of use of DME or  AE;Decreased knowledge of precautions;Pain   OT Treatment/Interventions: Self-care/ADL training;DME and/or AE instruction;Therapeutic activities;Patient/family education;Balance training    OT Goals(Current goals can be found in the care plan section) Acute Rehab OT Goals Patient Stated Goal: get back to work OT Goal Formulation: With patient Time For Goal Achievement: 06/21/15 Potential to Achieve Goals: Good ADL Goals Pt Will Perform Lower Body Dressing: with set-up;with supervision;sit to/from stand (with or without AE) Pt Will Transfer to Toilet: with supervision;ambulating;with set-up (3 in 1 over commode) Pt Will Perform Toileting - Clothing Manipulation and hygiene: with set-up;with supervision;sit to/from stand Additional ADL Goal #1: Pt will independently verbalize 3/3 back precautions and maintain in session.  OT Frequency: Min 2X/week   Barriers to D/C:            Co-evaluation              End of Session Equipment Utilized During Treatment: Gait belt;Back brace;Other (comment) (AE)  Activity Tolerance: Patient tolerated treatment well Patient left: in chair;with call bell/phone within reach   Time: [id number] OT Time Calculation (min): 17 min Charges:  OT General Charges $OT Visit: 1 Procedure OT Evaluation $OT Eval Moderate Complexity: 1 Procedure G-CodesBenito Mccreedy OTR/L [id number] 06/14/2015, 10:43 AM

## 2015-06-14 NOTE — Progress Notes (Signed)
Patient ID: Amanda Morris, female   DOB: 1950-09-05, 65 y.o.   MRN: [id number] C/o incisional pain, no weakness. See orders

## 2015-06-14 NOTE — Progress Notes (Signed)
Hourly rounding performed. Call light within reach. Pt in no acute distress. Denies needs. Sprite provided per pt request.

## 2015-06-14 NOTE — Evaluation (Signed)
Physical Therapy Evaluation Patient Details Name: Amanda Morris MRN: [id number] DOB: 09-Feb-1951 Today's Date: 06/14/2015   History of Present Illness  65 y.o. s/p L4-5 posterior lumbar fusion. Medical history includes tinnitus, nose bleeds, nasal congestion, sinus headache, sore throat, hypercholesterolemia, leg pain with walking, abdominal pain, arm weakness, leg weakness, back pain, arm pain, leg pain, diabetes, GERD, palpitations  Clinical Impression  Patient presents with problems listed below.  Will benefit from acute PT to maximize functional independence prior to discharge home.  Patient will need to reach Mod I functional level to return home with prn assist.  Recommend f/u HHPT for continued therapy at discharge.    Follow Up Recommendations Home health PT;Supervision - Intermittent    Equipment Recommendations  3in1 (PT)    Recommendations for Other Services       Precautions / Restrictions Precautions Precautions: Back;Fall Precaution Booklet Issued: Yes (comment) Precaution Comments: Reviewed back precautions and other mobility information with patient. Required Braces or Orthoses: Spinal Brace Spinal Brace: Lumbar corset;Applied in sitting position Restrictions Weight Bearing Restrictions: No      Mobility  Bed Mobility Overal bed mobility: Needs Assistance Bed Mobility: Rolling;Sidelying to Sit;Sit to Sidelying Rolling: Supervision Sidelying to sit: Supervision     Sit to sidelying: Min assist General bed mobility comments: Verbal cues for technique to maintain back precautions.  Increased time for mobility.  Assist to bring LE's on to bed to return to sidelying.  Patient able to don/doff brace in sitting with min assist.  Transfers Overall transfer level: Needs assistance Equipment used: Rolling walker (2 wheeled) Transfers: Sit to/from Stand Sit to Stand: Min assist         General transfer comment: Verbal cues for hand placement and technique.   Assist to steady during transfers.  Ambulation/Gait Ambulation/Gait assistance: Min guard Ambulation Distance (Feet): 70 Feet Assistive device: Rolling walker (2 wheeled) Gait Pattern/deviations: Step-through pattern;Decreased stride length;Shuffle Gait velocity: decreased Gait velocity interpretation: Below normal speed for age/gender General Gait Details: Patient with slow, guarded gait pattern.  Stairs            Wheelchair Mobility    Modified Rankin (Stroke Patients Only)       Balance                                             Pertinent Vitals/Pain Pain Assessment: 0-10 Pain Score: 8  Pain Location: Back and hips Pain Descriptors / Indicators: Aching;Sore Pain Intervention(s): Limited activity within patient's tolerance;Monitored during session;Repositioned;Patient requesting pain meds-RN notified    Home Living Family/patient expects to be discharged to:: Private residence Living Arrangements: Alone Available Help at Discharge: Family;Available PRN/intermittently (Niece) Type of Home: Apartment Home Access: Stairs to enter Entrance Stairs-Rails: None Entrance Stairs-Number of Steps: 1 Home Layout: One level Home Equipment: Environmental consultant - 2 wheels      Prior Function Level of Independence: Independent with assistive device(s)         Comments: used RW     Hand Dominance        Extremity/Trunk Assessment   Upper Extremity Assessment: Defer to OT evaluation           Lower Extremity Assessment: Generalized weakness         Communication   Communication: No difficulties  Cognition Arousal/Alertness: Awake/alert Behavior During Therapy: WFL for tasks assessed/performed Overall Cognitive Status: Within Functional Limits for  tasks assessed                      General Comments      Exercises        Assessment/Plan    PT Assessment Patient needs continued PT services  PT Diagnosis Difficulty  walking;Generalized weakness;Acute pain   PT Problem List Decreased strength;Decreased activity tolerance;Decreased balance;Decreased mobility;Decreased knowledge of use of DME;Decreased knowledge of precautions;Pain  PT Treatment Interventions DME instruction;Gait training;Functional mobility training;Therapeutic activities;Patient/family education   PT Goals (Current goals can be found in the Care Plan section) Acute Rehab PT Goals Patient Stated Goal: get back to work PT Goal Formulation: With patient Time For Goal Achievement: 06/21/15 Potential to Achieve Goals: Good    Frequency Min 5X/week   Barriers to discharge Decreased caregiver support Patient does not have 24 hour assist.    Co-evaluation               End of Session Equipment Utilized During Treatment: Gait belt;Back brace Activity Tolerance: Patient limited by pain Patient left: in bed;with call bell/phone within reach Nurse Communication: Mobility status;Patient requests pain meds         Time: 1347-1405 PT Time Calculation (min) (ACUTE ONLY): 18 min   Charges:   PT Evaluation $PT Eval Moderate Complexity: 1 Procedure     PT G Codes:        Despina Pole 2015-07-11, 2:55 PM Carita Pian. Sanjuana Kava, [place] Pager [phone]

## 2015-06-15 LAB — GLUCOSE, CAPILLARY
Glucose-Capillary: 163 mg/dL — ABNORMAL HIGH (ref 65–99)
Glucose-Capillary: 248 mg/dL — ABNORMAL HIGH (ref 65–99)
Glucose-Capillary: 206 mg/dL — ABNORMAL HIGH (ref 65–99)
Glucose-Capillary: 181 mg/dL — ABNORMAL HIGH (ref 65–99)

## 2015-06-15 NOTE — Progress Notes (Addendum)
Occupational Therapy Evaluation Patient Details Name: Amanda Morris MRN: [id number] DOB: 1950/11/17 Today's Date: 06/15/2015    History of Present Illness 65 y.o. s/p L4-5 posterior lumbar fusion. Medical history includes tinnitus, nose bleeds, nasal congestion, sinus headache, sore throat, hypercholesterolemia, leg pain with walking, abdominal pain, arm weakness, leg weakness, back pain, arm pain, leg pain, diabetes, GERD, palpitations   Clinical Impression   Pt was found asleep in chair on OT arrival and reported having sat up in chair for 6 hours today. Pt educated to only sit up in chair for 1 hour at a time and to call for assistance to change positions as needed - also alerted NT/RN. Pt completed transfers and ADLs with min-mod assist and verbal cues to adhere to back precautions. Reviewed back precautions, brace wear protocol, and compensatory strategies for ADLs - pt declined need for AE stating her family could assist her. Updated frequency and supervision level to reflect pt's very slow progress. Will continue to follow acutely.    Follow Up Recommendations  Home health OT;Supervision/Assistance - 24 hour    Equipment Recommendations  3 in 1 bedside comode    Recommendations for Other Services       Precautions / Restrictions Precautions Precautions: Back;Fall Precaution Booklet Issued: No Precaution Comments: Pt unable to recall any back precautions at beginning of session. Reviewed precautions and pt able to recall 2/3 at end of session. Required Braces or Orthoses: Spinal Brace Spinal Brace: Lumbar corset;Applied in sitting position Restrictions Weight Bearing Restrictions: No      Mobility Bed Mobility Overal bed mobility: Needs Assistance Bed Mobility: Rolling;Sit to Sidelying Rolling: Min assist       Sit to sidelying: Mod assist General bed mobility comments: HOB flat, use of bed rails. Min-mod assist for trunk supprot and to progres bilateral LE onto  bed. Verbal cues for proper log roll technique  Transfers Overall transfer level: Needs assistance Equipment used: Rolling walker (2 wheeled) Transfers: Sit to/from Stand Sit to Stand: Mod assist         General transfer comment: Mod assist for x2 sit-stand transfers and min assist for other. Verbal cues for safe hand placement and to adhere to back precautions    Balance Overall balance assessment: Needs assistance Sitting-balance support: No upper extremity supported;Feet supported Sitting balance-Leahy Scale: Fair     Standing balance support: No upper extremity supported;During functional activity Standing balance-Leahy Scale: Poor Standing balance comment: Reliant on UE support to maintain standing balance for more than a few seconds                            ADL Overall ADL's : Needs assistance/impaired     Grooming: Wash/dry hands;Supervision/safety;Standing           Upper Body Dressing : Minimal assistance;Sitting   Lower Body Dressing: Maximal assistance;Sit to/from stand   Toilet Transfer: Moderate assistance;Cueing for safety;Ambulation;BSC;RW Toilet Transfer Details (indicate cue type and reason): BSC over toilet, cues to feel BSC on back of legs before sitting Toileting- Clothing Manipulation and Hygiene: Moderate assistance;Sit to/from stand Toileting - Clothing Manipulation Details (indicate cue type and reason): Mod assist to stand from [facility], able to complete pericare and clothing manipulation with min guard     Functional mobility during ADLs: Minimal assistance;Rolling walker General ADL Comments: Pt found sitting in chair this afternoon and reported having been in chair for over 6 hours. Pt required increased time and mod assist for  transfers due to severe stiffness and pain. Educated pt to call RN/NT if in severe pain or ready to change positions. Notified RN/NT to help pt change positions frequently and pt should not be up in chair for  more than 45 minutes-1 hour at a time.      Vision Vision Assessment?: No apparent visual deficits   Perception     Praxis      Pertinent Vitals/Pain Pain Assessment: 0-10 Pain Score: 9  Pain Location: back Pain Descriptors / Indicators: Sore Pain Intervention(s): Limited activity within patient's tolerance;Monitored during session;Repositioned     Hand Dominance     Extremity/Trunk Assessment             Communication     Cognition Arousal/Alertness: Awake/alert Behavior During Therapy: Flat affect;WFL for tasks assessed/performed Overall Cognitive Status: Within Functional Limits for tasks assessed       Memory: Decreased recall of precautions             General Comments       Exercises       Shoulder Instructions      Home Living                                          Prior Functioning/Environment               OT Diagnosis:     OT Problem List:     OT Treatment/Interventions:      OT Goals(Current goals can be found in the care plan section) Acute Rehab OT Goals Patient Stated Goal: get back to work OT Goal Formulation: With patient Time For Goal Achievement: 06/21/15 Potential to Achieve Goals: Good ADL Goals Pt Will Perform Lower Body Dressing: with set-up;with supervision;sit to/from stand Pt Will Transfer to Toilet: with supervision;ambulating;with set-up Pt Will Perform Toileting - Clothing Manipulation and hygiene: with set-up;with supervision;sit to/from stand Additional ADL Goal #1: Pt will independently verbalize 3/3 back precautions and maintain in session.  OT Frequency: Min 3X/week   Barriers to D/C:            Co-evaluation              End of Session Equipment Utilized During Treatment: Gait belt;Rolling walker;Back brace Nurse Communication: Mobility status;Other (comment) (Only allow pt to sit up in chair for 1  hour at a time)  Activity Tolerance: Patient limited by pain Patient  left: in bed;with bed alarm set;with call bell/phone within reach;with SCD's reapplied   Time: [id number] OT Time Calculation (min): 24 min Charges:  OT General Charges $OT Visit: 1 Procedure OT Treatments $Self Care/Home Management : 23-37 mins G-Codes:    Redmond Baseman, OTR/L Pager: [phone] 06/15/2015, 4:40 PM

## 2015-06-15 NOTE — Progress Notes (Signed)
Physical Therapy Treatment Patient Details Name: Amanda Morris MRN: [id number] DOB: [birth year] Today's Date: 06/15/2015    History of Present Illness 65 y.o. s/p L4-5 posterior lumbar fusion. Medical history includes tinnitus, nose bleeds, nasal congestion, sinus headache, sore throat, hypercholesterolemia, leg pain with walking, abdominal pain, arm weakness, leg weakness, back pain, arm pain, leg pain, diabetes, GERD, palpitations    PT Comments    Pt continues with slow, steady progress. She reports today having 2 steps without rails to enter her house.  Follow Up Recommendations  Home health PT;Supervision - Intermittent     Equipment Recommendations  3in1 (PT)    Recommendations for Other Services       Precautions / Restrictions Precautions Precautions: Back;Fall Precaution Comments: Reviewed back precautions and other mobility information with patient. Required Braces or Orthoses: Spinal Brace Spinal Brace: Lumbar corset;Applied in sitting position    Mobility  Bed Mobility     Rolling: Supervision Sidelying to sit: Min assist;HOB elevated       General bed mobility comments: verbal cues for logroll, use of bed rail, assist to power up  Transfers   Equipment used: Rolling walker (2 wheeled)   Sit to Stand: Min assist         General transfer comment: verbal cues for hand placement, assist to power up and stabilize initial standing balance.  Ambulation/Gait Ambulation/Gait assistance: Min guard Ambulation Distance (Feet): 120 Feet Assistive device: Rolling walker (2 wheeled) Gait Pattern/deviations: Step-through pattern;Decreased stride length Gait velocity: decreased Gait velocity interpretation: Below normal speed for age/gender General Gait Details: slow, guarded gait   Stairs            Wheelchair Mobility    Modified Rankin (Stroke Patients Only)       Balance                                    Cognition  Arousal/Alertness: Awake/alert Behavior During Therapy: WFL for tasks assessed/performed Overall Cognitive Status: Within Functional Limits for tasks assessed                      Exercises      General Comments        Pertinent Vitals/Pain Pain Assessment: 0-10 Pain Score: 10-Worst pain ever Pain Location: back and hips Pain Descriptors / Indicators: Sore;Aching Pain Intervention(s): Monitored during session;Repositioned;RN gave pain meds during session    Home Living                      Prior Function            PT Goals (current goals can now be found in the care plan section) Acute Rehab PT Goals Patient Stated Goal: get back to work PT Goal Formulation: With patient Time For Goal Achievement: 06/21/15 Potential to Achieve Goals: Good Progress towards PT goals: Progressing toward goals    Frequency  Min 5X/week    PT Plan Current plan remains appropriate    Co-evaluation             End of Session Equipment Utilized During Treatment: Gait belt;Back brace Activity Tolerance: Patient tolerated treatment well Patient left: in chair;with call bell/phone within reach     Time: 0931-0951 PT Time Calculation (min) (ACUTE ONLY): 20 min  Charges:  $Gait Training: 8-22 mins  G Codes:      Amanda Morris 06/15/2015, 9:53 AM

## 2015-06-15 NOTE — Progress Notes (Signed)
Patient ID: Amanda Morris, female   DOB: [birth year], 65 y.o.   MRN: [id number] Temperature to 101.4 Incision on back is clean and dry Patient appears somewhat sedated Encouraged use of incentive spirometry and ambulation to help combat fever which I suspect is due to atelectasis.

## 2015-06-16 LAB — GLUCOSE, CAPILLARY
Glucose-Capillary: 164 mg/dL — ABNORMAL HIGH (ref 65–99)
Glucose-Capillary: 187 mg/dL — ABNORMAL HIGH (ref 65–99)
Glucose-Capillary: 191 mg/dL — ABNORMAL HIGH (ref 65–99)
Glucose-Capillary: 167 mg/dL — ABNORMAL HIGH (ref 65–99)

## 2015-06-16 NOTE — Progress Notes (Signed)
Physical Therapy Treatment Patient Details Name: Amanda Morris MRN: [id number] DOB: 1950/10/10 Today's Date: 06/16/2015    History of Present Illness 65 y.o. s/p L4-5 posterior lumbar fusion. Medical history includes tinnitus, nose bleeds, nasal congestion, sinus headache, sore throat, hypercholesterolemia, leg pain with walking, abdominal pain, arm weakness, leg weakness, back pain, arm pain, leg pain, diabetes, GERD, palpitations    PT Comments    Patient continues to require mod assist for supine to side to sit (with HOB flat and no rail), max cues for safe use of RW, and min assist to recover from 2 losses of balance while walking with RW. Patient reports she will be home alone most of the day as her niece works. Patient stated she needs "at least until Friday" before she thinks she will be strong enough to go home. Discussed possible need for post-acute inpatient therapies and pt is open to this suggestion. Will discuss with Case Manager/Social Worker.   Follow Up Recommendations  SNF;Supervision for mobility/OOB     Equipment Recommendations  3in1 (PT)    Recommendations for Other Services       Precautions / Restrictions Precautions Precautions: Back;Fall Precaution Booklet Issued: Yes (comment) Precaution Comments: pt recalled 2/3 precautions (forgot arching); vc to adhere with twisting Required Braces or Orthoses: Spinal Brace Spinal Brace: Lumbar corset;Applied in sitting position Restrictions Weight Bearing Restrictions: No    Mobility  Bed Mobility Overal bed mobility: Needs Assistance Bed Mobility: Rolling;Sidelying to Sit Rolling: Supervision Sidelying to sit: Mod assist (HOB flat, no rail)       General bed mobility comments: Pt required max cues for sequencing for coming up to EOB (even with subtle cues she was unable to perform correctly); physical assist during first 1/2 raising torso  Transfers Overall transfer level: Needs assistance Equipment used:  Rolling walker (2 wheeled) Transfers: Sit to/from Stand Sit to Stand: Min guard         General transfer comment: max vc for sequencing with RW;   Ambulation/Gait Ambulation/Gait assistance: Min assist Ambulation Distance (Feet): 140 Feet Assistive device: Rolling walker (2 wheeled) Gait Pattern/deviations: Step-through pattern;Decreased stride length Gait velocity: decreased Gait velocity interpretation: Below normal speed for age/gender General Gait Details: slow, guarded gait; LOB posteriorly twice when stepping backwards   Stairs Stairs: Yes Stairs assistance: Min assist Stair Management: No rails;Forwards;Step to pattern Number of Stairs: 1 (repeated x 2) General stair comments: Pt unable to ambulate distance to practice steps; brought single step to her room; discussed possible use of RW as support on one side (turned sideways as a rail) and niece providing HHA on opposite side.  Wheelchair Mobility    Modified Rankin (Stroke Patients Only)       Balance     Sitting balance-Leahy Scale: Fair     Standing balance support: No upper extremity supported Standing balance-Leahy Scale: Poor Standing balance comment: loss of balance posteriorly twice when took hands off RW                    Cognition Arousal/Alertness: Awake/alert Behavior During Therapy: Flat affect;WFL for tasks assessed/performed Overall Cognitive Status: Within Functional Limits for tasks assessed       Memory: Decreased recall of precautions              Exercises      General Comments        Pertinent Vitals/Pain Pain Assessment: 0-10 Pain Score: 9  Pain Location: low back and posterior thighs Pain Descriptors /  Indicators: Operative site guarding;Cramping Pain Intervention(s): Limited activity within patient's tolerance;Monitored during session;Premedicated before session;Repositioned    Home Living                      Prior Function            PT  Goals (current goals can now be found in the care plan section) Acute Rehab PT Goals Patient Stated Goal: get back to work PT Goal Formulation: With patient/family Time For Goal Achievement: 06/21/15 Progress towards PT goals: Progressing toward goals (slowly)    Frequency  Min 5X/week    PT Plan Discharge plan needs to be updated    Co-evaluation             End of Session Equipment Utilized During Treatment: Gait belt;Back brace Activity Tolerance: Patient limited by pain (in back and legs) Patient left: in chair;with call bell/phone within reach     Time: [id number] PT Time Calculation (min) (ACUTE ONLY): 39 min  Charges:  $Gait Training: 38-52 mins                    G Codes:      Sahira Cataldi 2015/06/25, 12:12 PM Pager [phone]

## 2015-06-16 NOTE — Care Management Note (Signed)
Case Management Note  Patient Details  Name: [name] MRN: [id number] Date of Birth: 1950-04-30  Subjective/Objective:                    Action/Plan: Patient was admitted for a LUMBAR FOUR-FIVE POSTERIOR LUMBAR FUSION.  Lives at home alone. Will follow for discharge needs pending PT/OT evals and physician orders.  Expected Discharge Date:                  Expected Discharge Plan:     In-House Referral:     Discharge planning Services     Post Acute Care Choice:    Choice offered to:     DME Arranged:    DME Agency:     HH Arranged:    HH Agency:     Status of Service:  In process, will continue to follow  Medicare Important Message Given:  Yes Date Medicare IM Given:    Medicare IM give by:    Date Additional Medicare IM Given:    Additional Medicare Important Message give by:     If discussed at [place] of Stay Meetings, dates discussed:    Additional Comments:  Rolm Baptise, RN 06/16/2015, 11:49 AM [phone]

## 2015-06-16 NOTE — Care Management Important Message (Signed)
Important Message  Patient Details  Name: Amanda Morris MRN: [id number] Date of Birth: 1951/01/22   Medicare Important Message Given:  Yes    Loann Quill 06/16/2015, 11:26 AM

## 2015-06-16 NOTE — Progress Notes (Signed)
Inpatient Diabetes Program Recommendations  AACE/ADA: New Consensus Statement on Inpatient Glycemic Control (2015)  Target Ranges:  Prepandial:   less than 140 mg/dL      Peak postprandial:   less than 180 mg/dL (1-2 hours)      Critically ill patients:  140 - 180 mg/dL   Results for SHEBA, SEFTON (MRN [id number]) as of 06/16/2015 09:07  Ref. Range 06/15/2015 06:39 06/15/2015 11:18 06/15/2015 16:48 06/15/2015 21:18 06/16/2015 06:30  Glucose-Capillary Latest Ref Range: 65-99 mg/dL 163 (H) 248 (H) 206 (H) 181 (H) 164 (H)   Review of Glycemic Control  Diabetes history: DM 2 Outpatient Diabetes medications: Glipizide 10 mg BID, Januvia 100 mg Daily Current orders for Inpatient glycemic control: Tradjenta 5 Daily, Glipizide 10 mg BID  Inpatient Diabetes Program Recommendations: Correction (SSI): Glucose spiked to the 250's around meals yesterday. While inpatient, please consider starting Novolog Sensitive Correction TID + HS coverage.  Thanks, Tama Headings RN, MSN, [facility] Inpatient Diabetes Coordinator Team Pager [phone] (8a-5p)

## 2015-06-17 LAB — GLUCOSE, CAPILLARY
Glucose-Capillary: 157 mg/dL — ABNORMAL HIGH (ref 65–99)
Glucose-Capillary: 232 mg/dL — ABNORMAL HIGH (ref 65–99)
Glucose-Capillary: 177 mg/dL — ABNORMAL HIGH (ref 65–99)
Glucose-Capillary: 157 mg/dL — ABNORMAL HIGH (ref 65–99)

## 2015-06-17 NOTE — NC FL2 (Signed)
[place] LEVEL OF CARE SCREENING TOOL     IDENTIFICATION  Patient Name: Amanda Morris Birthdate: 29-Oct-1950 Sex: female Admission Date (Current Location): 06/13/2015  [place] and [place] Number:  Mikel Cella [id number] [place] and Address:  [facility] [place] [address] [place], [place] [address]      Provider Number: [id number]  Attending Physician Name and Address:  Ashok Pall, MD  Relative Name and Phone Number:   Darl [phone])    Current Level of Care: Hospital Recommended Level of Care: [place] Prior Approval Number:    Date Approved/Denied:   PASRR Number:    Discharge Plan: SNF    Current Diagnoses: Patient Active Problem List   Diagnosis Date Noted  . Spondylolisthesis of lumbar region 06/13/2015  . Spondylolisthesis at L4-L5 level 04/07/2015  . Radiculopathy, lumbar region 04/07/2015  . Essential hypertension 02/14/2015  . Pain in female pelvis 12/02/2014  . Postprandial vomiting 02/04/2014  . Obesity (BMI 30-39.9) 11/01/2013  . Carotid artery stenosis 06/07/2013  . Unspecified hereditary and idiopathic peripheral neuropathy 06/07/2013  . History of migraine 05/04/2013  . Diabetes mellitus without complication ([place]) 123456  . SVT (supraventricular tachycardia) ([place]) 11/03/2010  . GERD (gastroesophageal reflux disease) 10/29/2010  . Chronic low back pain 10/29/2010  . Other and unspecified hyperlipidemia 10/29/2010    Orientation RESPIRATION BLADDER Height & Weight     Time, Self, Place, Situation  Normal Continent Weight: 222 lb (100.699 kg) Height:  5\' 4"  (162.6 cm)  BEHAVIORAL SYMPTOMS/MOOD NEUROLOGICAL BOWEL NUTRITION STATUS      Continent Diet (CARB MODIFIED)  AMBULATORY STATUS COMMUNICATION OF NEEDS Skin   Extensive Assist   Normal                       Personal Care Assistance Level of Assistance  Dressing, Bathing, Feeding Bathing Assistance: Maximum  assistance Feeding assistance: Limited assistance Dressing Assistance: Maximum assistance     Functional Limitations Info             SPECIAL CARE FACTORS FREQUENCY  PT (By licensed PT), OT (By licensed OT)     PT Frequency:  (5x/week) OT Frequency:  (3x/week)            Contractures      Additional Factors Info  Code Status, Allergies Code Status Info:  (NOT ON FILE) Allergies Info:  (Penicillins, Codeine, Metformin And Related)           Current Medications (06/17/2015):  This is the current hospital active medication list Current Facility-Administered Medications  Medication Dose Route Frequency Provider Last Rate Last Dose  . 0.9 %  sodium chloride infusion  250 mL Intravenous Continuous Ashok Pall, MD   250 mL at 06/13/15 1613  . 0.9 % NaCl with KCl 20 mEq/ L  infusion   Intravenous Continuous Ashok Pall, MD 80 mL/hr at 06/14/15 0521    . acetaminophen (TYLENOL) tablet 650 mg  650 mg Oral Q4H PRN Ashok Pall, MD   650 mg at 06/17/15 0127   Or  . acetaminophen (TYLENOL) suppository 650 mg  650 mg Rectal Q4H PRN Ashok Pall, MD      . aspirin EC tablet 81 mg  81 mg Oral Daily Ashok Pall, MD   81 mg at 06/16/15 0953  . diazepam (VALIUM) tablet 5 mg  5 mg Oral Q6H PRN Ashok Pall, MD   5 mg at 06/16/15 2254  . gabapentin (NEURONTIN) capsule 600 mg  600 mg Oral TID Ashok Pall, MD   600 mg at 06/16/15 2254  . glipiZIDE (GLUCOTROL) tablet 10 mg  10 mg Oral BID AC Ashok Pall, MD   10 mg at 06/16/15 1711  . linagliptin (TRADJENTA) tablet 5 mg  5 mg Oral Daily Ashok Pall, MD   5 mg at 06/16/15 0953  . menthol-cetylpyridinium (CEPACOL) lozenge 3 mg  1 lozenge Oral PRN Ashok Pall, MD       Or  . phenol (CHLORASEPTIC) mouth spray 1 spray  1 spray Mouth/Throat PRN Ashok Pall, MD      . morphine 2 MG/ML injection 1-4 mg  1-4 mg Intravenous Q3H PRN Ashok Pall, MD   2 mg at 06/13/15 1613  . ondansetron (ZOFRAN) injection 4 mg  4 mg Intravenous Q4H PRN Ashok Pall, MD   4 mg at 06/13/15 2254  . oxyCODONE (Oxy IR/ROXICODONE) immediate release tablet 10 mg  10 mg Oral Q4H PRN Leeroy Cha, MD   10 mg at 06/16/15 2254  . oxyCODONE-acetaminophen (PERCOCET/ROXICET) 5-325 MG per tablet 1-2 tablet  1-2 tablet Oral Q4H PRN Ashok Pall, MD   2 tablet at 06/16/15 1159  . pantoprazole (PROTONIX) EC tablet 40 mg  40 mg Oral Daily Ashok Pall, MD   40 mg at 06/16/15 0954  . perphenazine-amitriptyline (ETRAFON/TRIAVIL) 2-25 MG per tablet 1 tablet  1 tablet Oral TID Ashok Pall, MD   1 tablet at 06/16/15 2317  . pravastatin (PRAVACHOL) tablet 40 mg  40 mg Oral Daily Ashok Pall, MD   40 mg at 06/16/15 0953  . sodium chloride flush (NS) 0.9 % injection 3 mL  3 mL Intravenous Q12H Ashok Pall, MD   3 mL at 06/15/15 1530  . sodium chloride flush (NS) 0.9 % injection 3 mL  3 mL Intravenous PRN Ashok Pall, MD      . zolpidem (AMBIEN) tablet 5 mg  5 mg Oral QHS PRN Ashok Pall, MD         Discharge Medications: Please see discharge summary for a list of discharge medications.  Relevant Imaging Results:  Relevant Lab Results:   Additional Information  (SSN: [id number])  Leane Call, Student-SW [phone]

## 2015-06-17 NOTE — Progress Notes (Signed)
Patient ID: Amanda Morris, female   DOB: 08-Dec-1950, 65 y.o.   MRN: [id number] BP 126/70 mmHg  Pulse 97  Temp(Src) 98.7 F (37.1 C) (Oral)  Resp 20  Ht 5\' 4"  (1.626 m)  Wt 100.699 kg (222 lb)  BMI 38.09 kg/m2  SpO2 98% Alert and oriented x 4, speech is clear, fluent Moving lower extremities, states she has pain in the lower extremities bilaterally Wound is clean, and dry Once family approves snf,anticipate she will be ready for discharge.

## 2015-06-17 NOTE — Clinical Social Work Note (Signed)
Patient has a bed at [facility] and [place]. Patient's family has been made aware. BSW intern to arrange transport via [facility] once pt medically stable.   BSW intern remains available.  [place] intern [phone]

## 2015-06-17 NOTE — Clinical Social Work Placement (Signed)
   CLINICAL SOCIAL WORK PLACEMENT  NOTE  Date:  06/17/2015  Patient Details  Name: Sari Shales MRN: [id number] Date of Birth: 01/14/1951  Clinical Social Work is seeking post-discharge placement for this patient at the [place] level of care (*CSW will initial, date and re-position this form in  chart as items are completed):  Yes   Patient/family provided with [place] Work Department's list of facilities offering this level of care within the geographic area requested by the patient (or if unable, by the patient's family).  Yes   Patient/family informed of their freedom to choose among providers that offer the needed level of care, that participate in Medicare, Medicaid or managed care program needed by the patient, have an available bed and are willing to accept the patient.  Yes   Patient/family informed of [place]'s ownership interest in [facility] and [facility], as well as of the fact that they are under no obligation to receive care at these facilities.  PASRR submitted to EDS on       PASRR number received on       Existing PASRR number confirmed on       FL2 transmitted to all facilities in geographic area requested by pt/family on 06/17/15     FL2 transmitted to all facilities within larger geographic area on       Patient informed that his/her managed care company has contracts with or will negotiate with certain facilities, including the following:            Patient/family informed of bed offers received.  Patient chooses bed at       Physician recommends and patient chooses bed at      Patient to be transferred to   on  .  Patient to be transferred to facility by       Patient family notified on   of transfer.  Name of family member notified:        PHYSICIAN Please sign FL2     Additional Comment:    _______________________________________________ Leane Call, Student-SW 06/17/2015, 9:19 AM

## 2015-06-17 NOTE — Clinical Social Work Note (Signed)
Clinical Social Work Assessment  Patient Details  Name: Amanda Morris MRN: [id number] Date of Birth: 1951/02/19  Date of referral:  06/17/15               Reason for consult:  Discharge Planning, Facility Placement                Permission sought to share information with:    Permission granted to share information::  Yes, Verbal Permission Granted  Name::      (n/a)  Agency::   (SNF)  Relationship::   (n/a)  Contact Information:   (n/a)  Housing/Transportation Living arrangements for the past 2 months:  Apartment Source of Information:  Patient Patient Interpreter Needed:  None Criminal Activity/Legal Involvement Pertinent to Current Situation/Hospitalization:  No - Comment as needed Significant Relationships:  Other Family Members Lives with:  Self Do you feel safe going back to the place where you live?  No Need for family participation in patient care:  Yes (Comment)  Care giving concerns:   Patient has not expressed any care giving concerns at this time.   Social Worker assessment / plan:   Pt a/o x4 though pt appeared very sedated during BSW intern assessment. BSW intern has spoken with pt at bedside to discuss discharge dispositon. BSW intern made pt aware of SNF recommendations given by PT as well as presented SNF list. BSW intern further went over SNF process and insurance coverage with pt. Patient is from [facility] and lives home alone. However, patient stated that her niece will be providing supervision once she gets off from work. Pt is agreeable to d/c to SNF for rehab. Pt understands that this placement is only temporary. BSW intern to fax pt clinical to SNF within [facility] and [facility] per pt's request. BSW intern to f/u with pt in reference to extended bed offers once clinical have been reviewed by facility. BSW intern to continue to follow pt regarding post-discharge planning.  Employment status:  Unemployed Forensic scientist:  Medicare PT  Recommendations:  [place] / Referral to community resources:  [place]  Patient/Family's Response to care:  BSW intern intervention was well received by patient.  Patient/Family's Understanding of and Emotional Response to Diagnosis, Current Treatment, and Prognosis:   Pt understands need for further medical care and rehab at SNF.  Emotional Assessment Appearance:  Appears younger than stated age Attitude/Demeanor/Rapport:  Sedated, Lethargic Affect (typically observed):  Accepting, Calm, Flat Orientation:  Oriented to Self, Oriented to Place, Oriented to  Time, Oriented to Situation Alcohol / Substance use:  Not Applicable Psych involvement (Current and /or in the community):  No (Comment)  Discharge Needs  Concerns to be addressed:  No discharge needs identified Readmission within the last 30 days:  No Current discharge risk:  None Barriers to Discharge:  No Barriers Identified   Leane Call, Student-SW 06/17/2015, 9:35 AM

## 2015-06-17 NOTE — Clinical Social Work Note (Signed)
[place] MUST PASARR obtained: [id number].  Glendon Axe, MSW, LCSWA [phone] 06/17/2015 3:06 PM

## 2015-06-17 NOTE — Progress Notes (Signed)
Physical Therapy Treatment Patient Details Name: Amanda Morris MRN: [id number] DOB: 01/07/1951 Today's Date: 06/17/2015    History of Present Illness 65 y.o. s/p L4-5 posterior lumbar fusion. Medical history includes tinnitus, nose bleeds, nasal congestion, sinus headache, sore throat, hypercholesterolemia, leg pain with walking, abdominal pain, arm weakness, leg weakness, back pain, arm pain, leg pain, diabetes, GERD, palpitations    PT Comments    Patient with limited activity and required incr assist due to incr pain (now going fully down her legs to her feet). Patient showing poor carryover from education yesterday re: adhering to back precautions (? Distracted by pain?). Will need lots of repetition to learn how to move without further straining her back.   Follow Up Recommendations  SNF;Supervision for mobility/OOB     Equipment Recommendations  None recommended by PT    Recommendations for Other Services       Precautions / Restrictions Precautions Precautions: Back;Fall Precaution Booklet Issued: Yes (comment) Precaution Comments: recalled 3/3 (verbally); required frequent cues to adhere Required Braces or Orthoses: Spinal Brace Spinal Brace: Lumbar corset;Applied in sitting position Restrictions Weight Bearing Restrictions: No    Mobility  Bed Mobility Overal bed mobility: Needs Assistance Bed Mobility: Rolling;Sidelying to Sit Rolling: Min assist Sidelying to sit: Mod assist       General bed mobility comments: HOB flat, did use rail; pt required max cues again for proper sequence to minimize strain on her back; assist to roll and come up to sit due to incr pain  Transfers Overall transfer level: Needs assistance Equipment used: Rolling walker (2 wheeled) Transfers: Sit to/from Stand Sit to Stand: Min guard         General transfer comment: max vc for sequencing with RW;x2 (bed and BSC)   Ambulation/Gait Ambulation/Gait assistance: Min  guard Ambulation Distance (Feet): 10 Feet (toileted, 12) Assistive device: Rolling walker (2 wheeled) Gait Pattern/deviations: Step-through pattern;Decreased stride length;Shuffle Gait velocity: decreased Gait velocity interpretation: Below normal speed for age/gender General Gait Details: slow, guarded gait due to incr pain compared to 2/20   Stairs            Wheelchair Mobility    Modified Rankin (Stroke Patients Only)       Balance     Sitting balance-Leahy Scale: Fair     Standing balance support: No upper extremity supported Standing balance-Leahy Scale: Poor Standing balance comment: lots of cues at sink for proper use of RW and to prevent bending or twisting while washing hands (no carryover from 2/20--? due to pain)                    Cognition Arousal/Alertness: Awake/alert Behavior During Therapy: Flat affect;WFL for tasks assessed/performed Overall Cognitive Status: Within Functional Limits for tasks assessed       Memory: Decreased recall of precautions              Exercises      General Comments        Pertinent Vitals/Pain Pain Assessment: 0-10 Pain Score: 9  Pain Location: back, posterior legs all the way to her foot Pain Descriptors / Indicators: Cramping;Grimacing Pain Intervention(s): Limited activity within patient's tolerance;Monitored during session;Repositioned;Patient requesting pain meds-RN notified    Home Living                      Prior Function            PT Goals (current goals can now be found in the  care plan section) Acute Rehab PT Goals Patient Stated Goal: get back to work Time For Goal Achievement: 06/21/15 Progress towards PT goals: Not progressing toward goals - comment (incr pain compared to 2/20)    Frequency  Min 5X/week    PT Plan Current plan remains appropriate    Co-evaluation             End of Session Equipment Utilized During Treatment: Gait belt;Back  brace Activity Tolerance: Patient limited by pain (in back and legs) Patient left: in chair;with call bell/phone within reach     Time: 0952-1013 PT Time Calculation (min) (ACUTE ONLY): 21 min  Charges:  $Gait Training: 8-22 mins                    G Codes:      Tyshika Baldridge 06/29/2015, 10:39 AM  Pager [phone]

## 2015-06-18 DIAGNOSIS — M549 Dorsalgia, unspecified: Secondary | ICD-10-CM | POA: Diagnosis not present

## 2015-06-18 DIAGNOSIS — M4310 Spondylolisthesis, site unspecified: Secondary | ICD-10-CM | POA: Diagnosis not present

## 2015-06-18 DIAGNOSIS — M4306 Spondylolysis, lumbar region: Secondary | ICD-10-CM | POA: Diagnosis not present

## 2015-06-18 DIAGNOSIS — E784 Other hyperlipidemia: Secondary | ICD-10-CM | POA: Diagnosis not present

## 2015-06-18 DIAGNOSIS — J209 Acute bronchitis, unspecified: Secondary | ICD-10-CM | POA: Diagnosis not present

## 2015-06-18 DIAGNOSIS — K219 Gastro-esophageal reflux disease without esophagitis: Secondary | ICD-10-CM | POA: Diagnosis not present

## 2015-06-18 DIAGNOSIS — M6281 Muscle weakness (generalized): Secondary | ICD-10-CM | POA: Diagnosis not present

## 2015-06-18 DIAGNOSIS — Z4789 Encounter for other orthopedic aftercare: Secondary | ICD-10-CM | POA: Diagnosis not present

## 2015-06-18 DIAGNOSIS — E119 Type 2 diabetes mellitus without complications: Secondary | ICD-10-CM | POA: Diagnosis not present

## 2015-06-18 DIAGNOSIS — R278 Other lack of coordination: Secondary | ICD-10-CM | POA: Diagnosis not present

## 2015-06-18 DIAGNOSIS — E785 Hyperlipidemia, unspecified: Secondary | ICD-10-CM | POA: Diagnosis not present

## 2015-06-18 DIAGNOSIS — R2681 Unsteadiness on feet: Secondary | ICD-10-CM | POA: Diagnosis not present

## 2015-06-18 DIAGNOSIS — M4806 Spinal stenosis, lumbar region: Secondary | ICD-10-CM | POA: Diagnosis not present

## 2015-06-18 DIAGNOSIS — Z981 Arthrodesis status: Secondary | ICD-10-CM | POA: Diagnosis not present

## 2015-06-18 DIAGNOSIS — E1140 Type 2 diabetes mellitus with diabetic neuropathy, unspecified: Secondary | ICD-10-CM | POA: Diagnosis not present

## 2015-06-18 LAB — GLUCOSE, CAPILLARY
Glucose-Capillary: 160 mg/dL — ABNORMAL HIGH (ref 65–99)
Glucose-Capillary: 176 mg/dL — ABNORMAL HIGH (ref 65–99)

## 2015-06-18 MED ORDER — TIZANIDINE HCL 4 MG PO TABS: 4.0000 mg | ORAL_TABLET | Freq: Four times a day (QID) | ORAL | Status: DC | PRN

## 2015-06-18 MED ORDER — OXYCODONE-ACETAMINOPHEN 10-325 MG PO TABS: 1.0000 | ORAL_TABLET | Freq: Four times a day (QID) | ORAL | Status: DC | PRN

## 2015-06-18 NOTE — Clinical Social Work Placement (Signed)
   CLINICAL SOCIAL WORK PLACEMENT  NOTE  Date:  06/18/2015  Patient Details  Name: Amanda Morris MRN: [id number] Date of Birth: Aug 13, 1950  Clinical Social Work is seeking post-discharge placement for this patient at the [place] level of care (*CSW will initial, date and re-position this form in  chart as items are completed):  Yes   Patient/family provided with [place] Work Department's list of facilities offering this level of care within the geographic area requested by the patient (or if unable, by the patient's family).  Yes   Patient/family informed of their freedom to choose among providers that offer the needed level of care, that participate in Medicare, Medicaid or managed care program needed by the patient, have an available bed and are willing to accept the patient.  Yes   Patient/family informed of [place]'s ownership interest in [facility] and [facility], as well as of the fact that they are under no obligation to receive care at these facilities.  PASRR submitted to EDS on       PASRR number received on       Existing PASRR number confirmed on       FL2 transmitted to all facilities in geographic area requested by pt/family on 06/17/15     FL2 transmitted to all facilities within larger geographic area on       Patient informed that his/her managed care company has contracts with or will negotiate with certain facilities, including the following:        Yes   Patient/family informed of bed offers received.  Patient chooses bed at  [facility])     Physician recommends and patient chooses bed at  [facility])    Patient to be transferred to  ([facility]) on 06/18/15.  Patient to be transferred to facility by  [facility])     Patient family notified on 06/18/15 of transfer.  Name of family member notified:   ([name], niece)     PHYSICIAN Please sign FL2      Additional Comment:    _______________________________________________ Rozell Searing, LCSW 06/18/2015, 10:44 AM

## 2015-06-18 NOTE — Progress Notes (Signed)
Discharge orders received, Pt for discharge to [facility], report called to Onnie Graham, LPN. IV d/c'd. RX given to [facility] for transport. [facility] bought pt downstairs via Brewing technologist.

## 2015-06-18 NOTE — Discharge Instructions (Signed)

## 2015-06-18 NOTE — Clinical Social Work Note (Signed)
Clinical Social Worker facilitated patient discharge including contacting patient family and facility to confirm patient discharge plans.  Clinical information faxed to facility and family agreeable with plan.  CSW arranged ambulance transport via [facility] to [facility].  RN to call report prior to discharge.  Clinical Social Worker will sign off for now as social work intervention is no longer needed. Please consult [place] again if new need arises.  Glendon Axe, MSW, LCSWA [phone] 06/18/2015 2:37 PM

## 2015-06-18 NOTE — Care Management Note (Signed)
Case Management Note  Patient Details  Name: Amanda Morris MRN: [id number] Date of Birth: [birth year]  Subjective/Objective:                    Action/Plan: Patient discharging to [facility]F today. No further needs per CM.   Expected Discharge Date:                  Expected Discharge Plan:     In-House Referral:     Discharge planning Services     Post Acute Care Choice:    Choice offered to:     DME Arranged:    DME Agency:     HH Arranged:    HH Agency:     Status of Service:  In process, will continue to follow  Medicare Important Message Given:  Yes Date Medicare IM Given:    Medicare IM give by:    Date Additional Medicare IM Given:    Additional Medicare Important Message give by:     If discussed at [place] of Stay Meetings, dates discussed:    Additional Comments:  Pollie Friar, RN 06/18/2015, 3:00 PM

## 2015-06-18 NOTE — Clinical Social Work Note (Signed)
Patient has bed at [facility]. Patient's niece to complete admissions paperwork at 3:30/4PM at facility. Facility aware.   CSW remains available as needed.  Glendon Axe, MSW, LCSWA [phone] 06/18/2015 10:46 AM

## 2015-06-18 NOTE — Discharge Summary (Signed)
Physician Discharge Summary  Patient ID: Amanda Morris MRN: [id number] DOB/AGE: 63-Mar-1952 63 y.o.  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 Post op she has been slow to mobilize, and is still not steady on her feet. She will need more rehab and supervision post op. She is medically ready for discharge. Her wound is clean, dry, and is without signs of infection. Amanda Morris moves all extremities, and is voiding.   Treatments: surgery: LUMBAR FOUR-FIVE POSTERIOR LUMBAR Interbody FUSION L4/5,  2 12x33mm Peek cages(Synthes), autograft morsels in both the cages and disc space Lumbar nerve roots 4, 5 decompression via complete L4 laminectomy, L5 hemilaminectomy, L4 inferior facetectomies Non segmental pedicle screw fixation L4, L5 Nuvasive mas plif instrumentation   Discharge Exam: Blood pressure 103/63, pulse 102, temperature 97.7 F (36.5 C), temperature source Axillary, resp. rate 16, height 5\' 4"  (1.626 m), weight 100.699 kg (222 lb), SpO2 94 %. General appearance: alert, cooperative, appears stated age, no distress and mild distress  Disposition: Final discharge disposition not confirmed lumbar spondylolistheis    Medication List    TAKE these medications        ALEVE 220 MG tablet  Generic drug:  naproxen sodium  Take 220-440 mg by mouth 2 (two) times daily as needed.     aspirin EC 81 MG tablet  Take 81 mg by mouth daily.     DEXILANT 60 MG capsule  Generic drug:  dexlansoprazole  Take 60 mg by  mouth daily.     gabapentin 300 MG capsule  Commonly known as:  NEURONTIN  Take 2 capsules (600 mg total) by mouth 3 (three) times daily.     glipiZIDE 10 MG tablet  Commonly known as:  GLUCOTROL  TAKE 1 TABLET (10 MG TOTAL) BY MOUTH 2 (TWO) TIMES DAILY BEFORE A MEAL.     oxyCODONE-acetaminophen 10-325 MG tablet  Commonly known as:  PERCOCET  Take 0.5-1 tablets by mouth every 12 (twelve) hours as needed for pain. Fill on or after Jan 10th     oxyCODONE-acetaminophen 10-325 MG tablet  Commonly known as:  PERCOCET  Take 1 tablet by mouth every 6 (six) hours as needed for pain.     perphenazine-amitriptyline 2-25 MG Tabs tablet  Commonly known as:  ETRAFON/TRIAVIL  Take 1 tablet by mouth 3 (three) times daily.     pravastatin 40 MG tablet  Commonly known as:  PRAVACHOL  TAKE 1 TABLET (40 MG TOTAL) BY MOUTH DAILY.     sitaGLIPtin 100 MG tablet  Commonly known as:  JANUVIA  TAKE 1 TABLET (100 MG TOTAL) BY MOUTH DAILY.     tiZANidine 4 MG tablet  Commonly known as:  ZANAFLEX  Take 1 tablet (4 mg total) by mouth every 6 (six) hours as needed for muscle spasms.           Follow-up Information    Follow up with [facility] .   Specialty:  [place] information:   [place] [phone]      Follow up with Jarah Pember L, MD In 3  weeks.   Specialty:  Neurosurgery   Why:  call the office to make an appointment for approximately 3 weeks   Contact information:   [address] [place] [address] [phone]       Signed: Winfield Cunas 06/18/2015, 2:19 PM

## 2015-06-18 NOTE — Progress Notes (Signed)
Occupational Therapy Treatment Patient Details Name: Quatesha Fochtman MRN: [id number] DOB: [birth year] Today's Date: 06/18/2015    History of present illness 65 y.o. s/p L4-5 posterior lumbar fusion. Medical history includes tinnitus, nose bleeds, nasal congestion, sinus headache, sore throat, hypercholesterolemia, leg pain with walking, abdominal pain, arm weakness, leg weakness, back pain, arm pain, leg pain, diabetes, GERD, palpitations   OT comments  Pt making progress toward OT goals this session; report pain (8/10) but agreeable to participate in therapy. Pt currently overall min guard for ADLs in standing and functional mobility with max verbal cues to maintain back precautions throughout functional activities. Pt able to verbally recall 2/3 precautions at start of session. Updated d/c plan to SNF secondary to decreased caregiver support upon d/c and pts current level of participation in ADLs and mobility. Will continue to follow acutely.    Follow Up Recommendations  SNF;Supervision/Assistance - 24 hour    Equipment Recommendations  3 in 1 bedside comode    Recommendations for Other Services      Precautions / Restrictions Precautions Precautions: Back;Fall Precaution Booklet Issued: Yes (comment) Precaution Comments: Pt able to verbally recall 2/3 precautions. VCs to maintain during functional activites. Required Braces or Orthoses: Spinal Brace Spinal Brace: Lumbar corset;Applied in sitting position Restrictions Weight Bearing Restrictions: No       Mobility Bed Mobility               General bed mobility comments: Pt OOB in chair upon arrival.  Transfers Overall transfer level: Needs assistance Equipment used: Rolling walker (2 wheeled) Transfers: Sit to/from Stand Sit to Stand: Min guard         General transfer comment: VCs for hand placement and technique. Sit to stand from chair x 1, toilet x 1.    Balance Overall balance assessment: Needs  assistance Sitting-balance support: Feet supported;No upper extremity supported Sitting balance-Leahy Scale: Good     Standing balance support: No upper extremity supported;During functional activity Standing balance-Leahy Scale: Fair Standing balance comment: Pt able to stand at sink and complete grooming and UB/LB bathing. No major LOB noted. Required VCs to maintain precautions.                   ADL Overall ADL's : Needs assistance/impaired     Grooming: Oral care;Wash/dry face;Wash/dry hands;Standing;Min guard Grooming Details (indicate cue type and reason): Reviewed use of 2 cups for oral care; pt able to return demo technique. Upper Body Bathing: Min guard;Standing Upper Body Bathing Details (indicate cue type and reason): VCs for back precautions Lower Body Bathing: Min guard;Sit to/from stand           Toilet Transfer: Min guard;Ambulation;Regular Toilet;RW   Toileting- Clothing Manipulation and Hygiene: Min guard;Sitting/lateral lean Toileting - Clothing Manipulation Details (indicate cue type and reason): for toilet hygiene     Functional mobility during ADLs: Min guard;Rolling walker General ADL Comments: No family present for OT session. Discussed not sitting in chair for longer than 45 min to an hour. Educated on maintaining back precautions during functional activities.      Vision                     Perception     Praxis      Cognition   Behavior During Therapy: Flat affect;WFL for tasks assessed/performed Overall Cognitive Status: Within Functional Limits for tasks assessed       Memory: Decreased recall of precautions  Extremity/Trunk Assessment               Exercises     Shoulder Instructions       General Comments      Pertinent Vitals/ Pain       Pain Assessment: 0-10 Pain Score: 8  Pain Location: back Pain Descriptors / Indicators: Aching Pain Intervention(s): Limited activity within  patient's tolerance;Monitored during session  Home Living                                          Prior Functioning/Environment              Frequency Min 3X/week     Progress Toward Goals  OT Goals(current goals can now be found in the care plan section)  Progress towards OT goals: Progressing toward goals  Acute Rehab OT Goals Patient Stated Goal: get back to work OT Goal Formulation: With patient  Plan Discharge plan needs to be updated    Co-evaluation                 End of Session Equipment Utilized During Treatment: Gait belt;Back brace;Rolling walker   Activity Tolerance Patient tolerated treatment well   Patient Left in chair;with call bell/phone within reach;with chair alarm set   Nurse Communication          Time: [id number] OT Time Calculation (min): 23 min  Charges: OT General Charges $OT Visit: 1 Procedure OT Treatments $Self Care/Home Management : 23-37 mins  Binnie Kand M.S., OTR/L Pager: [phone]  06/18/2015, 2:23 PM

## 2015-06-18 NOTE — Progress Notes (Signed)
Physical Therapy Treatment Patient Details Name: Amanda Morris MRN: [id number] DOB: 10-May-1950 Today's Date: 06/18/2015    History of Present Illness 65 y.o. s/p L4-5 posterior lumbar fusion. Medical history includes tinnitus, nose bleeds, nasal congestion, sinus headache, sore throat, hypercholesterolemia, leg pain with walking, abdominal pain, arm weakness, leg weakness, back pain, arm pain, leg pain, diabetes, GERD, palpitations    PT Comments    Patient has progressively had increased pain over past 3 days (as observed by this PT). Today she has increased pain in Rt leg (pins and needles) compared to Lt leg and has visible antalgic gait pattern. Noted she had not had pain medicine since 2/21 10:00 am (although did have neurontin this am).    Follow Up Recommendations  SNF;Supervision for mobility/OOB     Equipment Recommendations  None recommended by PT    Recommendations for Other Services       Precautions / Restrictions Precautions Precautions: Back;Fall Precaution Booklet Issued: Yes (comment) Precaution Comments: recalled 3/3 (verbally); required frequent cues to adhere Required Braces or Orthoses: Spinal Brace Spinal Brace: Lumbar corset;Applied in sitting position Restrictions Weight Bearing Restrictions: No    Mobility  Bed Mobility Overal bed mobility: Needs Assistance Bed Mobility: Rolling;Sidelying to Sit Rolling: Min guard Sidelying to sit: Min assist       General bed mobility comments: HOB flat, did use rail; pt required min verbal and tactile cues again for proper sequence to adhere to back precautions; assist to roll and come up to sit due to incr pain  Transfers Overall transfer level: Needs assistance Equipment used: Rolling walker (2 wheeled) Transfers: Sit to/from Stand Sit to Stand: Min guard         General transfer comment: max vc for sequencing with RW (ultimately required visual demonstration to  understand)  Ambulation/Gait Ambulation/Gait assistance: Min assist Ambulation Distance (Feet): 120 Feet Assistive device: Rolling walker (2 wheeled) Gait Pattern/deviations: Step-through pattern;Decreased stride length;Decreased weight shift to right;Antalgic Gait velocity: decreased Gait velocity interpretation: Below normal speed for age/gender General Gait Details: slow, guarded gait due to incr pain; now visibly antalgic with RLE worse than LLE; one posterior LOB   Stairs            Wheelchair Mobility    Modified Rankin (Stroke Patients Only)       Balance     Sitting balance-Leahy Scale: Fair     Standing balance support: Bilateral upper extremity supported Standing balance-Leahy Scale: Poor                      Cognition Arousal/Alertness: Awake/alert Behavior During Therapy: Flat affect;WFL for tasks assessed/performed Overall Cognitive Status: Within Functional Limits for tasks assessed       Memory: Decreased recall of precautions              Exercises      General Comments        Pertinent Vitals/Pain Pain Assessment: 0-10 Pain Score: 9  Pain Location: back, Rt>Lt leg Pain Descriptors / Indicators: Pins and needles;Operative site guarding Pain Intervention(s): Limited activity within patient's tolerance;Monitored during session;Repositioned;Patient requesting pain meds-RN notified    Home Living                      Prior Function            PT Goals (current goals can now be found in the care plan section) Acute Rehab PT Goals Patient Stated Goal: get back to  work Time For Goal Achievement: 06/21/15 Progress towards PT goals: Not progressing toward goals - comment (decr recall and incr pain)    Frequency  Min 5X/week    PT Plan Current plan remains appropriate    Co-evaluation             End of Session Equipment Utilized During Treatment: Gait belt;Back brace Activity Tolerance: Patient limited  by pain (in back and legs) Patient left: in chair;with call bell/phone within reach     Time: 0924-0943 PT Time Calculation (min) (ACUTE ONLY): 19 min  Charges:  $Gait Training: 8-22 mins                    G Codes:      Fidelia Cathers 2015/07/01, 9:57 AM Pager [phone]

## 2015-06-19 DIAGNOSIS — K219 Gastro-esophageal reflux disease without esophagitis: Secondary | ICD-10-CM | POA: Diagnosis not present

## 2015-06-19 DIAGNOSIS — M4310 Spondylolisthesis, site unspecified: Secondary | ICD-10-CM | POA: Diagnosis not present

## 2015-06-19 DIAGNOSIS — E119 Type 2 diabetes mellitus without complications: Secondary | ICD-10-CM | POA: Diagnosis not present

## 2015-06-19 DIAGNOSIS — E784 Other hyperlipidemia: Secondary | ICD-10-CM | POA: Diagnosis not present

## 2015-06-20 DIAGNOSIS — J209 Acute bronchitis, unspecified: Secondary | ICD-10-CM | POA: Diagnosis not present

## 2015-06-20 DIAGNOSIS — K219 Gastro-esophageal reflux disease without esophagitis: Secondary | ICD-10-CM | POA: Diagnosis not present

## 2015-06-20 DIAGNOSIS — E1140 Type 2 diabetes mellitus with diabetic neuropathy, unspecified: Secondary | ICD-10-CM | POA: Diagnosis not present

## 2015-06-20 DIAGNOSIS — Z981 Arthrodesis status: Secondary | ICD-10-CM | POA: Diagnosis not present

## 2015-06-20 DIAGNOSIS — M4806 Spinal stenosis, lumbar region: Secondary | ICD-10-CM | POA: Diagnosis not present

## 2015-06-20 LAB — URINE CULTURE: Culture: >=100000

## 2015-06-23 DIAGNOSIS — E119 Type 2 diabetes mellitus without complications: Secondary | ICD-10-CM | POA: Diagnosis not present

## 2015-06-23 DIAGNOSIS — K219 Gastro-esophageal reflux disease without esophagitis: Secondary | ICD-10-CM | POA: Diagnosis not present

## 2015-06-23 DIAGNOSIS — M4310 Spondylolisthesis, site unspecified: Secondary | ICD-10-CM | POA: Diagnosis not present

## 2015-06-23 DIAGNOSIS — E785 Hyperlipidemia, unspecified: Secondary | ICD-10-CM | POA: Diagnosis not present

## 2015-07-08 ENCOUNTER — Ambulatory Visit: Payer: Medicare Other | Admitting: Family Medicine

## 2015-07-10 ENCOUNTER — Encounter: Payer: Self-pay | Admitting: Family Medicine

## 2015-07-10 ENCOUNTER — Ambulatory Visit (INDEPENDENT_AMBULATORY_CARE_PROVIDER_SITE_OTHER): Payer: Medicare Other | Admitting: Family Medicine

## 2015-07-10 VITALS — BP 122/75 | HR 94 | Wt 225.0 lb

## 2015-07-10 DIAGNOSIS — E119 Type 2 diabetes mellitus without complications: Secondary | ICD-10-CM | POA: Diagnosis not present

## 2015-07-10 DIAGNOSIS — M4316 Spondylolisthesis, lumbar region: Secondary | ICD-10-CM | POA: Diagnosis not present

## 2015-07-10 DIAGNOSIS — Z8739 Personal history of other diseases of the musculoskeletal system and connective tissue: Secondary | ICD-10-CM | POA: Diagnosis not present

## 2015-07-10 DIAGNOSIS — I10 Essential (primary) hypertension: Secondary | ICD-10-CM | POA: Diagnosis not present

## 2015-07-10 DIAGNOSIS — M4726 Other spondylosis with radiculopathy, lumbar region: Secondary | ICD-10-CM | POA: Diagnosis not present

## 2015-07-10 MED ORDER — GLIPIZIDE 10 MG PO TABS: ORAL_TABLET | ORAL | Status: DC

## 2015-07-10 MED ORDER — FUROSEMIDE 20 MG PO TABS: 20.0000 mg | ORAL_TABLET | Freq: Every day | ORAL | Status: DC | PRN

## 2015-07-10 MED ORDER — PRAVASTATIN SODIUM 40 MG PO TABS: ORAL_TABLET | ORAL | Status: DC

## 2015-07-10 NOTE — Progress Notes (Signed)
Subjective:    Patient ID: Amanda Morris, female    DOB: [birth year], 65 y.o.   MRN: [id number]  HPI Patient is coming in today for hospital follow-up. She was admitted on every 17th for surgery for lumbar spondylolisthesis L4-L5 and lumbar stenosis at L4-L5. She was discharged home on every 22nd. She comes in today using a walker and wearing her back brace. She actually had a follow-up appointment today and her next one is in about 4 weeks. She is doing well overall. She has noticed some ankle swelling since having her surgery. She's been trying to prop her feet up and keep them elevated for the last couple of days and actually has helped. He is not currently engaged in any physical therapy at this time. She reports that they did not change any of her medications except she thought they might be giving her Januvia twice a day while she was in the hospital. I reassured her it should just be taken once a day. Her last A1c in February was 7.5.  Hypertension- Pt denies chest pain, SOB, dizziness, or heart palpitations.  Taking meds as directed w/o problems.  Denies medication side effects.      Review of Systems  BP 122/75 mmHg  Pulse 94  Wt 225 lb (102.059 kg)  SpO2 100%    Allergies  Allergen Reactions  . Penicillins Hives  . Codeine Itching  . Metformin And Related Other (See Comments)    Nausea and diarrhea.     Past Medical History  Diagnosis Date  . Allergy   . Palpitations   . Diabetes mellitus without complication ([place])   . GERD (gastroesophageal reflux disease)   . History of hiatal hernia   . Headache     migraines last one 2 weeks ago    Past Surgical History  Procedure Laterality Date  . Abdominal hysterectomy  1982  . Shoulder surgery  06/2008, 09/2010    Right, by Dr. Karie Soda, then Dr. Judeth Horn    Social History   Social History  . Marital Status: Single    Spouse Name: N/A  . Number of Children: 1  . Years of Education: 10th grade   Occupational  History  . Disabled.     Social History Main Topics  . Smoking status: Former Smoker    Types: Cigarettes    Quit date: 07/30/2007  . Smokeless tobacco: Not on file  . Alcohol Use: No  . Drug Use: No  . Sexual Activity: No   Other Topics Concern  . Not on file   Social History Narrative   Caffeine intake. Some regular exercise.    Family History  Problem Relation Age of Onset  . Heart disease Mother   . Diabetes Mother   . Hypertension Mother   . Stroke Mother   . Heart disease Father   . Heart disease Sister   . Diabetes Sister   . Hyperlipidemia Sister   . Hypertension Sister   . Stroke Sister   . Alcohol abuse Brother   . Hyperlipidemia Brother     Outpatient Encounter Prescriptions as of 07/10/2015  Medication Sig  . aspirin EC 81 MG tablet Take 81 mg by mouth daily.  Marland Kitchen dexlansoprazole (DEXILANT) 60 MG capsule Take 60 mg by mouth daily.  Marland Kitchen gabapentin (NEURONTIN) 300 MG capsule Take 2 capsules (600 mg total) by mouth 3 (three) times daily.  Marland Kitchen glipiZIDE (GLUCOTROL) 10 MG tablet TAKE 1 TABLET (10 MG TOTAL) BY MOUTH 2 (TWO)  TIMES DAILY BEFORE A MEAL.  . naproxen sodium (ALEVE) 220 MG tablet Take 220-440 mg by mouth 2 (two) times daily as needed.  Marland Kitchen oxyCODONE-acetaminophen (PERCOCET) 10-325 MG tablet Take 1 tablet by mouth every 6 (six) hours as needed for pain.  Marland Kitchen perphenazine-amitriptyline (ETRAFON/TRIAVIL) 2-25 MG TABS Take 1 tablet by mouth 3 (three) times daily.  . pravastatin (PRAVACHOL) 40 MG tablet TAKE 1 TABLET (40 MG TOTAL) BY MOUTH QHS.  Marland Kitchen sitaGLIPtin (JANUVIA) 100 MG tablet TAKE 1 TABLET (100 MG TOTAL) BY MOUTH DAILY.  Marland Kitchen tiZANidine (ZANAFLEX) 4 MG tablet Take 1 tablet (4 mg total) by mouth every 6 (six) hours as needed for muscle spasms.  . [DISCONTINUED] glipiZIDE (GLUCOTROL) 10 MG tablet TAKE 1 TABLET (10 MG TOTAL) BY MOUTH 2 (TWO) TIMES DAILY BEFORE A MEAL.  . [DISCONTINUED] pravastatin (PRAVACHOL) 40 MG tablet TAKE 1 TABLET (40 MG TOTAL) BY MOUTH DAILY.   . furosemide (LASIX) 20 MG tablet Take 1 tablet (20 mg total) by mouth daily as needed for fluid.  . [DISCONTINUED] oxyCODONE-acetaminophen (PERCOCET) 10-325 MG tablet Take 0.5-1 tablets by mouth every 12 (twelve) hours as needed for pain. Fill on or after Jan 10th (Patient taking differently: Take 0.5-1 tablets by mouth every 8 (eight) hours as needed for pain. Fill on or after Jan 10th)   No facility-administered encounter medications on file as of 07/10/2015.          Objective:   Physical Exam  Constitutional: She is oriented to person, place, and time. She appears well-developed and well-nourished.  HENT:  Head: Normocephalic and atraumatic.  Cardiovascular: Normal rate, regular rhythm and normal heart sounds.   Pulmonary/Chest: Effort normal and breath sounds normal.  Neurological: She is alert and oriented to person, place, and time.  Skin: Skin is warm and dry.  Psychiatric: She has a normal mood and affect. Her behavior is normal.          Assessment & Plan:  Status post lumbar decompression and arthrodesis for spondylolisthesis at L4-5. She is doing well and getting more mobile. 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    HTN - well controlled. Continue current regimen.  DM- due to f/u in May for diabetes. Continue current regimen.   Continue Januvia once daily.

## 2015-08-12 ENCOUNTER — Encounter: Payer: Self-pay | Admitting: Family Medicine

## 2015-08-12 ENCOUNTER — Ambulatory Visit (INDEPENDENT_AMBULATORY_CARE_PROVIDER_SITE_OTHER): Payer: Medicare Other | Admitting: Family Medicine

## 2015-08-12 VITALS — BP 120/72 | HR 111 | Wt 216.0 lb

## 2015-08-12 DIAGNOSIS — L298 Other pruritus: Secondary | ICD-10-CM

## 2015-08-12 DIAGNOSIS — E119 Type 2 diabetes mellitus without complications: Secondary | ICD-10-CM

## 2015-08-12 DIAGNOSIS — N6489 Other specified disorders of breast: Secondary | ICD-10-CM | POA: Diagnosis not present

## 2015-08-12 DIAGNOSIS — N898 Other specified noninflammatory disorders of vagina: Secondary | ICD-10-CM

## 2015-08-12 DIAGNOSIS — N62 Hypertrophy of breast: Secondary | ICD-10-CM | POA: Insufficient documentation

## 2015-08-12 DIAGNOSIS — L293 Anogenital pruritus, unspecified: Secondary | ICD-10-CM | POA: Diagnosis not present

## 2015-08-12 DIAGNOSIS — R8299 Other abnormal findings in urine: Secondary | ICD-10-CM | POA: Diagnosis not present

## 2015-08-12 DIAGNOSIS — R8290 Unspecified abnormal findings in urine: Secondary | ICD-10-CM | POA: Diagnosis not present

## 2015-08-12 LAB — POCT URINALYSIS DIPSTICK
Bilirubin, UA: NEGATIVE
Blood, UA: NEGATIVE
Glucose, UA: 250
Ketones, UA: NEGATIVE
Nitrite, UA: NEGATIVE
Protein, UA: NEGATIVE
Spec Grav, UA: 1.01
Urobilinogen, UA: 0.2
pH, UA: 5.5

## 2015-08-12 LAB — GLUCOSE, POCT (MANUAL RESULT ENTRY): POC Glucose: 363 mg/dl — AB (ref 70–99)

## 2015-08-12 MED ORDER — METFORMIN HCL ER 500 MG PO TB24: 500.0000 mg | ORAL_TABLET | Freq: Every day | ORAL | Status: DC

## 2015-08-12 MED ORDER — SITAGLIPTIN PHOSPHATE 100 MG PO TABS: ORAL_TABLET | ORAL | Status: DC

## 2015-08-12 MED ORDER — GLUCOSE BLOOD VI STRP: ORAL_STRIP | Status: DC

## 2015-08-12 MED ORDER — LANCETS MICRO THIN 33G MISC: Status: DC

## 2015-08-12 MED ORDER — ACCU-CHEK AVIVA PLUS W/DEVICE KIT: PACK | Status: DC

## 2015-08-12 NOTE — Progress Notes (Signed)
Subjective:    Patient ID: Amanda Morris, female    DOB: [birth year], 65 y.o.   MRN: [id number]  HPI Diabetes - no hypoglycemic events. No wounds or sores that are not healing well. + increased thirst or urination. Checking glucose at home. Taking medications as prescribed without any side effects.Patient brought in a log of her blood sugars most of them have been running in the high 100s and in the 200 range for the last couple of weeks. Yesterday she had one that was 343. She is currently on Januvia. She is intolerant to metformin. She does report some increased thirst and urination. He also notes that she's noticed a very strong odor to urine especially in the mornings. No dysuria or hematuria. She also has had some vaginal itching and irritation for the last week as well. No fevers or chills or sweats. She denies any other symptoms of acute infections. She also denies any major changes in diet etc.    She would also like a referral for consult for possible breast reduction. Unfortunately she's having difficulty healing from her spinal surgery with Dr. Cyndy Freeze. He feels like a breast reduction would significantly improve her recovery. She gets a lot of upper back pain and has a lot of problems with her shoulders with the stop straps digging in into her skin.  Review of Systems  BP 120/72 mmHg  Pulse 111  Wt 216 lb (97.977 kg)  SpO2 96%    Allergies  Allergen Reactions  . Penicillins Hives  . Codeine Itching  . Metformin And Related Other (See Comments)    Nausea and diarrhea.     Past Medical History  Diagnosis Date  . Allergy   . Palpitations   . Diabetes mellitus without complication ([place])   . GERD (gastroesophageal reflux disease)   . History of hiatal hernia   . Headache     migraines last one 2 weeks ago    Past Surgical History  Procedure Laterality Date  . Abdominal hysterectomy  1982  . Shoulder surgery  06/2008, 09/2010    Right, by Dr. Karie Soda, then Dr.  Judeth Horn    Social History   Social History  . Marital Status: Single    Spouse Name: N/A  . Number of Children: 1  . Years of Education: 10th grade   Occupational History  . Disabled.     Social History Main Topics  . Smoking status: Former Smoker    Types: Cigarettes    Quit date: 07/30/2007  . Smokeless tobacco: Not on file  . Alcohol Use: No  . Drug Use: No  . Sexual Activity: No   Other Topics Concern  . Not on file   Social History Narrative   Caffeine intake. Some regular exercise.    Family History  Problem Relation Age of Onset  . Heart disease Mother   . Diabetes Mother   . Hypertension Mother   . Stroke Mother   . Heart disease Father   . Heart disease Sister   . Diabetes Sister   . Hyperlipidemia Sister   . Hypertension Sister   . Stroke Sister   . Alcohol abuse Brother   . Hyperlipidemia Brother     Outpatient Encounter Prescriptions as of 08/12/2015  Medication Sig  . amitriptyline (ELAVIL) 25 MG tablet Take 50 mg by mouth 2 (two) times daily.  Marland Kitchen aspirin EC 81 MG tablet Take 81 mg by mouth daily.  Marland Kitchen dexlansoprazole (DEXILANT) 60 MG capsule Take 60  mg by mouth daily.  . furosemide (LASIX) 20 MG tablet Take 1 tablet (20 mg total) by mouth daily as needed for fluid.  Marland Kitchen gabapentin (NEURONTIN) 300 MG capsule Take 2 capsules (600 mg total) by mouth 3 (three) times daily.  Marland Kitchen glipiZIDE (GLUCOTROL) 10 MG tablet TAKE 1 TABLET (10 MG TOTAL) BY MOUTH 2 (TWO) TIMES DAILY BEFORE A MEAL.  . naproxen sodium (ALEVE) 220 MG tablet Take 220-440 mg by mouth 2 (two) times daily as needed.  Marland Kitchen oxyCODONE-acetaminophen (PERCOCET) 10-325 MG tablet Take 1 tablet by mouth every 6 (six) hours as needed for pain.  Marland Kitchen perphenazine (TRILAFON) 2 MG tablet TAKE 1 TABLET BY MOUTH THREE TIMES A DAY WITH AMITRIP.  Marland Kitchen pravastatin (PRAVACHOL) 40 MG tablet TAKE 1 TABLET (40 MG TOTAL) BY MOUTH QHS.  Marland Kitchen sitaGLIPtin (JANUVIA) 100 MG tablet TAKE 1 TABLET (100 MG TOTAL) BY MOUTH DAILY.   Marland Kitchen tiZANidine (ZANAFLEX) 4 MG tablet Take 1 tablet (4 mg total) by mouth every 6 (six) hours as needed for muscle spasms.  . metFORMIN (GLUCOPHAGE-XR) 500 MG 24 hr tablet Take 1 tablet (500 mg total) by mouth daily with breakfast.  . [DISCONTINUED] perphenazine-amitriptyline (ETRAFON/TRIAVIL) 2-25 MG TABS Take 1 tablet by mouth 3 (three) times daily.   No facility-administered encounter medications on file as of 08/12/2015.          Objective:   Physical Exam  Constitutional: She is oriented to person, place, and time. She appears well-developed and well-nourished.  HENT:  Head: Normocephalic and atraumatic.  Cardiovascular: Normal rate, regular rhythm and normal heart sounds.   Pulmonary/Chest: Effort normal and breath sounds normal.  Neurological: She is alert and oriented to person, place, and time.  Skin: Skin is warm and dry.  Psychiatric: She has a normal mood and affect. Her behavior is normal.          Assessment & Plan:  DM- We did do a point of care glucose today was 363. She ate about 1 hour ago. We discussed several options. She really doesn't want to move forward with any type of insulin. She is willing to retry metformin in the extended release version which I reassured her is typically better tolerated than the twice a day version, in regard to GI symptoms. She said she will try it. New perception sent to the pharmacy. Continue to track glucose once or twice a day. If not coming down over the next week and please give [place] a call back.  Urine odor-we'll check urinalysis for possible UTI. This could certainly be driving up her blood sugars.  Vaginal itching-possible yeast infection especially with elevated blood sugars. Wet prep performed today. Will call for results once available.  Large breasts-I do think she would benefit significantly from reduction. I think it would help greatly with recovery with her back pain and mobility. Referral placed for consult plastic  surgery.

## 2015-08-13 ENCOUNTER — Ambulatory Visit: Payer: Medicare Other | Admitting: Family Medicine

## 2015-08-13 LAB — WET PREP, GENITAL
Trich, Wet Prep: NONE SEEN
WBC, Wet Prep HPF POC: NONE SEEN

## 2015-08-13 MED ORDER — METRONIDAZOLE 500 MG PO TABS: 500.0000 mg | ORAL_TABLET | Freq: Two times a day (BID) | ORAL | Status: DC

## 2015-08-13 MED ORDER — FLUCONAZOLE 150 MG PO TABS: 150.0000 mg | ORAL_TABLET | Freq: Once | ORAL | Status: DC

## 2015-08-13 NOTE — Addendum Note (Signed)
Addended by: Beatrice Lecher D on: 08/13/2015 10:08 AM   Modules accepted: Orders

## 2015-08-14 ENCOUNTER — Ambulatory Visit: Payer: Medicare Other | Admitting: Family Medicine

## 2015-08-14 DIAGNOSIS — R112 Nausea with vomiting, unspecified: Secondary | ICD-10-CM | POA: Diagnosis not present

## 2015-08-14 DIAGNOSIS — K589 Irritable bowel syndrome without diarrhea: Secondary | ICD-10-CM | POA: Diagnosis not present

## 2015-08-14 DIAGNOSIS — E119 Type 2 diabetes mellitus without complications: Secondary | ICD-10-CM | POA: Diagnosis not present

## 2015-08-14 DIAGNOSIS — K219 Gastro-esophageal reflux disease without esophagitis: Secondary | ICD-10-CM | POA: Diagnosis not present

## 2015-08-14 LAB — CBC AND DIFFERENTIAL
HCT: 38 % (ref 36–46)
Hemoglobin: 11.7 g/dL — AB (ref 12.0–16.0)
Neutrophils Absolute: 4 /uL
Platelets: 251 10*3/uL (ref 150–399)
WBC: 7.8 10^3/mL

## 2015-08-14 LAB — BASIC METABOLIC PANEL
BUN: 7 mg/dL (ref 4–21)
Creatinine: 0.8 mg/dL (ref 0.5–1.1)
Glucose: 182 mg/dL
Sodium: 142 mmol/L (ref 137–147)

## 2015-08-14 LAB — HEPATIC FUNCTION PANEL
ALT: 14 U/L (ref 7–35)
AST: 13 U/L (ref 13–35)
Alkaline Phosphatase: 117 U/L (ref 25–125)
Bilirubin, Total: 0.2 mg/dL

## 2015-08-14 LAB — URINE CULTURE: Colony Count: 50000

## 2015-08-14 LAB — HEP A AB, TOTAL
HBsAg Screen: NEGATIVE
Hep A Ab, IgM: NEGATIVE
Hep B Core Ab, IgM: NEGATIVE

## 2015-08-14 LAB — HEPATITIS C ANTIBODY: Hepatitis C Ab: NEGATIVE

## 2015-08-15 ENCOUNTER — Ambulatory Visit (INDEPENDENT_AMBULATORY_CARE_PROVIDER_SITE_OTHER): Payer: Medicare Other | Admitting: Sports Medicine

## 2015-08-15 ENCOUNTER — Encounter: Payer: Self-pay | Admitting: Sports Medicine

## 2015-08-15 VITALS — BP 124/78 | HR 76 | Wt 214.0 lb

## 2015-08-15 DIAGNOSIS — R8290 Unspecified abnormal findings in urine: Secondary | ICD-10-CM

## 2015-08-15 DIAGNOSIS — R8299 Other abnormal findings in urine: Secondary | ICD-10-CM | POA: Diagnosis not present
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
## 2015-08-17 LAB — URINE CULTURE
Colony Count: NO GROWTH
Organism ID, Bacteria: NO GROWTH

## 2015-08-19 DIAGNOSIS — M546 Pain in thoracic spine: Secondary | ICD-10-CM | POA: Diagnosis not present

## 2015-08-19 DIAGNOSIS — N62 Hypertrophy of breast: Secondary | ICD-10-CM | POA: Diagnosis not present

## 2015-08-19 DIAGNOSIS — M545 Low back pain: Secondary | ICD-10-CM | POA: Diagnosis not present

## 2015-08-19 DIAGNOSIS — M542 Cervicalgia: Secondary | ICD-10-CM | POA: Diagnosis not present

## 2015-08-22 ENCOUNTER — Telehealth: Payer: Self-pay

## 2015-08-22 ENCOUNTER — Encounter: Payer: Self-pay | Admitting: Family Medicine

## 2015-08-22 NOTE — Telephone Encounter (Signed)
Yes, okay to decrease down to half a tab and see if she is able to tolerate that better.

## 2015-08-22 NOTE — Telephone Encounter (Signed)
Notified patient of recommendation. 

## 2015-08-28 ENCOUNTER — Encounter: Payer: Self-pay | Admitting: Family Medicine

## 2015-08-28 ENCOUNTER — Ambulatory Visit: Payer: Medicare Other | Admitting: Family Medicine

## 2015-08-28 ENCOUNTER — Ambulatory Visit (INDEPENDENT_AMBULATORY_CARE_PROVIDER_SITE_OTHER): Payer: Medicare Other | Admitting: Family Medicine

## 2015-08-28 VITALS — BP 121/76 | HR 104 | Wt 217.0 lb

## 2015-08-28 DIAGNOSIS — I10 Essential (primary) hypertension: Secondary | ICD-10-CM

## 2015-08-28 DIAGNOSIS — E119 Type 2 diabetes mellitus without complications: Secondary | ICD-10-CM | POA: Diagnosis not present

## 2015-08-28 LAB — POCT GLYCOSYLATED HEMOGLOBIN (HGB A1C): Hemoglobin A1C: 9.9

## 2015-08-28 MED ORDER — LIRAGLUTIDE 18 MG/3ML ~~LOC~~ SOPN: 0.6000 mg | PEN_INJECTOR | Freq: Every day | SUBCUTANEOUS | Status: DC

## 2015-08-28 NOTE — Progress Notes (Signed)
   Subjective:    Patient ID: Amanda Morris, female    DOB: 1951/01/27, 65 y.o.   MRN: [id number]  HPI Diabetes - no hypoglycemic events. No wounds or sores that are not healing well. No increased thirst or urination. Checking glucose at home. Taking medications as prescribed without any side effects. She denies any major dietary changes. She does drink soft drinks but it's diet. She doesn't really drink juice and she says sh she stays away from all bread and chip products. She says she really doesn't use any condiments like ketchup and barbecue sauce is thickened and have a lot of sugar. She reports that she is taking the Januvia and the glipizide but has been splitting the metformin because she was worried it would upset her stomach. Home fasting blood sugars have been running in the 170s to 190s.  Hypertension- Pt denies chest pain, SOB, dizziness, or heart palpitations.  Taking meds as directed w/o problems.  Denies medication side effects.      Review of Systems     Objective:   Physical Exam  Constitutional: She is oriented to person, place, and time. She appears well-developed and well-nourished.  HENT:  Head: Normocephalic and atraumatic.  Eyes: Conjunctivae and EOM are normal.  Cardiovascular: Normal rate, regular rhythm and normal heart sounds.   Pulmonary/Chest: Effort normal and breath sounds normal.  Neurological: She is alert and oriented to person, place, and time.  Skin: Skin is warm and dry. No pallor.  Psychiatric: She has a normal mood and affect. Her behavior is normal.  Vitals reviewed.       Assessment & Plan:  DM- Uncontrolled. Hemoglobin A1c of 9.9 today which is up from previous of 7.5. Try increasing to a whole tab daily on the metformin. Explained that I had switched her to extended release which should hopefully be better tolerated. We also discussed that we need to add another agent. I would prefer to start insulin but she really is extremely hesitant.  Thus we will try Victoza which seems to be covered on her insurance and see if she does well with this and we can get her A1c down. I think the major increase in her sugar levels are due to her inactivity because of her back.  HTN - well controlled.

## 2015-09-01 ENCOUNTER — Other Ambulatory Visit: Payer: Self-pay | Admitting: Family Medicine

## 2015-09-08 ENCOUNTER — Other Ambulatory Visit: Payer: Self-pay | Admitting: Family Medicine

## 2015-09-09 ENCOUNTER — Telehealth: Payer: Self-pay

## 2015-09-09 NOTE — Telephone Encounter (Signed)
Okay to go back down to half of a tab which she was tolerating well before. Please also see if she was able to get the Victoza filled?

## 2015-09-09 NOTE — Telephone Encounter (Signed)
Amanda Morris states she has had vomiting in the evening for the last couple of weeks. She believes it is due to the metformin. She felt like this the first time she tried metformin. Denies fever, chills, BM problems or sweats. Please advise.

## 2015-09-10 NOTE — Telephone Encounter (Signed)
Patient advised of recommendations. She has started the Victoza.

## 2015-09-10 NOTE — Telephone Encounter (Signed)
Ok to stop it.  Has she started teh Victoza

## 2015-09-10 NOTE — Telephone Encounter (Signed)
Patient states she went down to the half a tablet two weeks ago and she still cannot tolerate the half of tablet.

## 2015-09-30 DIAGNOSIS — N62 Hypertrophy of breast: Secondary | ICD-10-CM | POA: Diagnosis not present

## 2015-09-30 DIAGNOSIS — Z794 Long term (current) use of insulin: Secondary | ICD-10-CM | POA: Diagnosis not present

## 2015-09-30 DIAGNOSIS — E119 Type 2 diabetes mellitus without complications: Secondary | ICD-10-CM | POA: Diagnosis not present

## 2015-10-09 ENCOUNTER — Ambulatory Visit (HOSPITAL_BASED_OUTPATIENT_CLINIC_OR_DEPARTMENT_OTHER): Admit: 2015-10-09 | Payer: Medicare Other | Admitting: Plastic Surgery

## 2015-10-09 ENCOUNTER — Encounter (HOSPITAL_BASED_OUTPATIENT_CLINIC_OR_DEPARTMENT_OTHER): Payer: Self-pay

## 2015-10-09 SURGERY — MAMMOPLASTY, REDUCTION
Anesthesia: General | Site: Breast | Laterality: Bilateral

## 2015-10-24 DIAGNOSIS — R7309 Other abnormal glucose: Secondary | ICD-10-CM | POA: Diagnosis not present

## 2015-11-03 ENCOUNTER — Telehealth: Payer: Self-pay

## 2015-11-03 NOTE — Telephone Encounter (Signed)
Pt stated that her next appointment with you is August 8, do you recommend seeing her before that?

## 2015-11-03 NOTE — Telephone Encounter (Signed)
Yes, I would be happy to. Have her bring her log in so I can see how sugars have been running.

## 2015-11-03 NOTE — Telephone Encounter (Signed)
I think she really just needs to start long acting insulin and work with a nutritionist.  I endocrinologist is going to recommend the same thing.

## 2015-11-03 NOTE — Telephone Encounter (Signed)
Notified patient of recommendation and transferred to scheduling.

## 2015-11-11 ENCOUNTER — Ambulatory Visit: Payer: Medicare Other | Admitting: Family Medicine

## 2015-11-11 ENCOUNTER — Ambulatory Visit (INDEPENDENT_AMBULATORY_CARE_PROVIDER_SITE_OTHER): Payer: Medicare Other | Admitting: Family Medicine

## 2015-11-11 ENCOUNTER — Encounter: Payer: Self-pay | Admitting: Family Medicine

## 2015-11-11 VITALS — BP 130/78 | HR 93 | Wt 224.0 lb

## 2015-11-11 DIAGNOSIS — E119 Type 2 diabetes mellitus without complications: Secondary | ICD-10-CM

## 2015-11-11 DIAGNOSIS — I10 Essential (primary) hypertension: Secondary | ICD-10-CM | POA: Diagnosis not present

## 2015-11-11 DIAGNOSIS — R8290 Unspecified abnormal findings in urine: Secondary | ICD-10-CM | POA: Diagnosis not present

## 2015-11-11 LAB — POCT URINALYSIS DIPSTICK
Bilirubin, UA: NEGATIVE
Blood, UA: NEGATIVE
Glucose, UA: NEGATIVE
Ketones, UA: NEGATIVE
Nitrite, UA: NEGATIVE
Protein, UA: NEGATIVE
Spec Grav, UA: 1.02
Urobilinogen, UA: 0.2
pH, UA: 6.5

## 2015-11-11 MED ORDER — INSULIN GLARGINE 100 UNIT/ML SOLOSTAR PEN: 10.0000 [IU] | PEN_INJECTOR | Freq: Every day | SUBCUTANEOUS | Status: DC

## 2015-11-11 MED ORDER — LIRAGLUTIDE 18 MG/3ML ~~LOC~~ SOPN
1.2000 mg | PEN_INJECTOR | Freq: Every day | SUBCUTANEOUS | Status: DC
Start: 2015-11-11 — End: 2016-02-03

## 2015-11-11 NOTE — Patient Instructions (Signed)
Increase Victoza to 1.2 mg. Start Lantus at 10 units at bedtime. Increase by 1 unit each night until blood sugar in the morning is under 120. Once you see that number than stay at that dose until I see you back.

## 2015-11-11 NOTE — Progress Notes (Signed)
Subjective:    CC:   HPI: Diabetes - no hypoglycemic events. No wounds or sores that are not healing well. No increased thirst or urination. Checking glucose at home.she had to stop the metformin due to nausea and vomiting. She is on [place], victoza, and glipizide.   He did bring a log of her blood sugars. They're definitely swinging. She has some in the 200s and some that are low in the 60s. She checks her sugar 3 times a day.She really wants to have her breast reduction surgery but they will not do it unless her hemoglobin A1c is closer to 6. Her current A1c is 8.1.  Lab Results  Component Value Date   HGBA1C 9.9 08/28/2015    Hypertension- Pt denies chest pain, SOB, dizziness, or heart palpitations.  Taking meds as directed w/o problems.  Denies medication side effects.      Patient says that she's been noticing she is waking up with very dark urine with a bad odor. She's noticed over the last couple weeks. No dysuria or blood.      Past medical history, Surgical history, Family history not pertinant except as noted below, Social history, Allergies, and medications have been entered into the medical record, reviewed, and corrections made.   Review of Systems: No fevers, chills, night sweats, weight loss, chest pain, or shortness of breath.   Objective:    General: Well Developed, well nourished, and in no acute distress.  Neuro: Alert and oriented x3, extra-ocular muscles intact, sensation grossly intact.  HEENT: Normocephalic, atraumatic  Skin: Warm and dry, no rashes. Cardiac: Regular rate and rhythm, no murmurs rubs or gallops, no lower extremity edema.  Respiratory: Clear to auscultation bilaterally. Not using accessory muscles, speaking in full sentences.   Impression and Recommendations:   DM- Start Lantus 10 units at bedtime. Can go up by 1 unit a day until morning sugar is under 120. We may need to consider backing off of the glipizide if she continues to have  hypoglycemic episodes as this can definitely trigger it. We'll discontinue the metformin she did not tolerate his of GI side effects even the extended release product. Will increase Victoza to 1.2 mg.Warned that it can cause nausea as well and to monitor carefully for this. If she starts to vomit and get back down to 0.6 mg.  Urine odor-we'll do urinalysis. Only pos for leuks. Will send for culture.    HTN - well controlled.

## 2015-11-12 LAB — HEMOGLOBIN A1C
Hgb A1c MFr Bld: 8.3 % — ABNORMAL HIGH (ref <5.7)
Mean Plasma Glucose: 192 mg/dL

## 2015-11-13 DIAGNOSIS — M4316 Spondylolisthesis, lumbar region: Secondary | ICD-10-CM | POA: Diagnosis not present

## 2015-11-13 DIAGNOSIS — M4726 Other spondylosis with radiculopathy, lumbar region: Secondary | ICD-10-CM | POA: Diagnosis not present

## 2015-11-14 LAB — URINE CULTURE: Colony Count: >=100000

## 2015-11-14 MED ORDER — SULFAMETHOXAZOLE-TRIMETHOPRIM 800-160 MG PO TABS: 1.0000 | ORAL_TABLET | Freq: Two times a day (BID) | ORAL | Status: DC

## 2015-11-14 NOTE — Addendum Note (Signed)
Addended by: Beatrice Lecher D on: 11/14/2015 12:59 PM   Modules accepted: Orders

## 2015-11-19 ENCOUNTER — Telehealth: Payer: Self-pay

## 2015-11-19 ENCOUNTER — Encounter (HOSPITAL_BASED_OUTPATIENT_CLINIC_OR_DEPARTMENT_OTHER): Admission: RE | Payer: Self-pay | Source: Ambulatory Visit

## 2015-11-19 ENCOUNTER — Ambulatory Visit (HOSPITAL_BASED_OUTPATIENT_CLINIC_OR_DEPARTMENT_OTHER): Admission: RE | Admit: 2015-11-19 | Payer: Medicare Other | Source: Ambulatory Visit | Admitting: Plastic Surgery

## 2015-11-19 SURGERY — MAMMOPLASTY, REDUCTION
Anesthesia: General | Laterality: Bilateral

## 2015-12-02 ENCOUNTER — Encounter: Payer: Self-pay | Admitting: Family Medicine

## 2015-12-02 ENCOUNTER — Ambulatory Visit (INDEPENDENT_AMBULATORY_CARE_PROVIDER_SITE_OTHER): Payer: Medicare Other | Admitting: Family Medicine

## 2015-12-02 VITALS — BP 122/77 | HR 89 | Wt 228.0 lb

## 2015-12-02 DIAGNOSIS — E119 Type 2 diabetes mellitus without complications: Secondary | ICD-10-CM

## 2015-12-02 NOTE — Progress Notes (Signed)
Subjective:    CC:   HPI:  DM-  Patient coming in to follow-up on her diabetes today. We made several adjustments at her last office visit. We increased her Victoza actually started her on 10 units of Lantus and had her go up by 1 unit every night until her blood sugar was normal. She is now up to 26 units. She has had 2 lows in the last week. One specifically that she remembers was 43. She still taking the glipizide regularly. And she's been trying to eat healthy. She did notice that the pain is really increase her sugar.  Past medical history, Surgical history, Family history not pertinant except as noted below, Social history, Allergies, and medications have been entered into the medical record, reviewed, and corrections made.   Review of Systems: No fevers, chills, night sweats, weight loss, chest pain, or shortness of breath.   Objective:    General: Well Developed, well nourished, and in no acute distress.  Neuro: Alert and oriented x3, extra-ocular muscles intact, sensation grossly intact.  HEENT: Normocephalic, atraumatic  Skin: Warm and dry, no rashes. Cardiac: Regular rate and rhythm, no murmurs rubs or gallops, no lower extremity edema.  Respiratory: Clear to auscultation bilaterally. Not using accessory muscles, speaking in full sentences.   Impression and Recommendations:   Diabetes-it sounds like her blood sugars are finally getting back into the normal range. I want to keep her Lantus at 25 units for now and follow out over the next 3 weeks. We can check a hemoglobin A1c at that time and see if it's trending downward. Normally we would wait 90 days that we are trying to get her scheduled for surgery.

## 2015-12-02 NOTE — Patient Instructions (Signed)
Stay at 25 units on your Lantus.

## 2015-12-23 ENCOUNTER — Encounter: Payer: Self-pay | Admitting: Family Medicine

## 2015-12-23 ENCOUNTER — Ambulatory Visit (INDEPENDENT_AMBULATORY_CARE_PROVIDER_SITE_OTHER): Payer: Medicare Other | Admitting: Family Medicine

## 2015-12-23 VITALS — BP 121/78 | HR 97 | Wt 227.0 lb

## 2015-12-23 DIAGNOSIS — Z23 Encounter for immunization: Secondary | ICD-10-CM

## 2015-12-23 DIAGNOSIS — E119 Type 2 diabetes mellitus without complications: Secondary | ICD-10-CM | POA: Diagnosis not present

## 2015-12-23 DIAGNOSIS — I10 Essential (primary) hypertension: Secondary | ICD-10-CM | POA: Diagnosis not present

## 2015-12-23 DIAGNOSIS — N6489 Other specified disorders of breast: Secondary | ICD-10-CM | POA: Diagnosis not present

## 2015-12-23 LAB — POCT GLYCOSYLATED HEMOGLOBIN (HGB A1C): Hemoglobin A1C: 7.7

## 2015-12-23 MED ORDER — INSULIN GLARGINE 100 UNIT/ML SOLOSTAR PEN
26.0000 [IU] | PEN_INJECTOR | Freq: Every day | SUBCUTANEOUS | 99 refills | Status: DC
Start: 2015-12-23 — End: 2016-02-03

## 2015-12-23 NOTE — Progress Notes (Signed)
Subjective:    CC: DM  HPI:  Diabetes - no hypoglycemic events. No wounds or sores that are not healing well. No increased thirst or urination. Checking glucose at home. Not in log of home blood sugars. Most of them look fantastic. The highest was 227 in the evening. Ambien blood sugars look great.. The lowest is Taking medications as prescribed without any side effects.  Hypertension- Pt denies chest pain, SOB, dizziness, or heart palpitations.  Taking meds as directed w/o problems.  Denies medication side effects.    She Had to cancel hers surgery for breast reduction. The plastic surgeon wanted her to get her blood sugars under better control before moving forward.   Review of Systems: No fevers, chills, night sweats, weight loss, chest pain, or shortness of breath.   Objective:    General: Well Developed, well nourished, and in no acute distress.  Neuro: Alert and oriented x3, extra-ocular muscles intact, sensation grossly intact.  HEENT: Normocephalic, atraumatic  Skin: Warm and dry, no rashes. Cardiac: Regular rate and rhythm, no murmurs rubs or gallops, no lower extremity edema.  Respiratory: Clear to auscultation bilaterally. Not using accessory muscles, speaking in full sentences.   Impression and Recommendations:   DM- Uncontrolled. 11 A1c of 7.7 today, much improved from previous. Down from 9.9 in May. She has done a fantastic job and has been working hard on her diet and following instruction.  Increase Victoza to 1.2 mg daily. This should help with some of the pre-meal blood sugars. Fastings look absolutely fantastic in the mornings. Her blood sugars over the last month has been at goal. If she can continue with her current regimen then her next A1c should be under 7 which is perfect. I will go ahead and write a letter to her plastic surgeon recommending that they go ahead and schedule her surgery as I feel that she is in a safe place with her blood sugars and should have great  wound healing. Discussed some dietary strategies to lower he evening sugars even more.    HTN - Diabetes - no hypoglycemic events. No wounds or sores that are not healing well. No increased thirst or urination. Checking glucose at home. Taking medications as prescribed without any side effects.  Pendulous breast - will write lett.

## 2015-12-23 NOTE — Patient Instructions (Signed)
Increase your Victoza to 1.2 mg a day. Lantus 26 units at bedtime.

## 2016-01-13 ENCOUNTER — Telehealth: Payer: Self-pay | Admitting: Family Medicine

## 2016-01-13 ENCOUNTER — Encounter: Payer: Self-pay | Admitting: Family Medicine

## 2016-01-13 DIAGNOSIS — M4316 Spondylolisthesis, lumbar region: Secondary | ICD-10-CM
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
## 2016-01-14 NOTE — Telephone Encounter (Signed)
Pt called clinic today stating she needs a letter that Dr. Madilyn Fireman has NEVER treated Pt for back pain or any back issues for the last 5 years.

## 2016-01-14 NOTE — Telephone Encounter (Signed)
Called and spoke with Pt advising that PCP has treated Pt during the last 5 years. She is going to call who is requesting the letter for more information then return clinic call. Hold on letter until Pt calls back.

## 2016-01-15 ENCOUNTER — Other Ambulatory Visit: Payer: Self-pay | Admitting: Family Medicine

## 2016-01-27 DIAGNOSIS — M546 Pain in thoracic spine: Secondary | ICD-10-CM | POA: Diagnosis not present

## 2016-01-27 DIAGNOSIS — N62 Hypertrophy of breast: Secondary | ICD-10-CM | POA: Diagnosis not present

## 2016-01-27 DIAGNOSIS — F17211 Nicotine dependence, cigarettes, in remission: Secondary | ICD-10-CM | POA: Diagnosis not present

## 2016-01-27 DIAGNOSIS — M545 Low back pain: Secondary | ICD-10-CM | POA: Diagnosis not present

## 2016-01-27 DIAGNOSIS — E119 Type 2 diabetes mellitus without complications: Secondary | ICD-10-CM | POA: Diagnosis not present

## 2016-01-27 DIAGNOSIS — Z794 Long term (current) use of insulin: Secondary | ICD-10-CM | POA: Diagnosis not present

## 2016-01-27 DIAGNOSIS — M542 Cervicalgia: Secondary | ICD-10-CM | POA: Diagnosis not present

## 2016-02-03 ENCOUNTER — Encounter: Payer: Self-pay | Admitting: Family Medicine

## 2016-02-03 ENCOUNTER — Ambulatory Visit: Payer: Medicare Other | Admitting: Family Medicine

## 2016-02-03 ENCOUNTER — Ambulatory Visit (INDEPENDENT_AMBULATORY_CARE_PROVIDER_SITE_OTHER): Payer: Medicare Other | Admitting: Family Medicine

## 2016-02-03 VITALS — BP 142/79 | HR 103 | Wt 229.0 lb

## 2016-02-03 DIAGNOSIS — E119 Type 2 diabetes mellitus without complications: Secondary | ICD-10-CM

## 2016-02-03 DIAGNOSIS — M545 Low back pain, unspecified: Secondary | ICD-10-CM

## 2016-02-03 DIAGNOSIS — G8929 Other chronic pain: Secondary | ICD-10-CM

## 2016-02-03 DIAGNOSIS — M4316 Spondylolisthesis, lumbar region: Secondary | ICD-10-CM

## 2016-02-03 DIAGNOSIS — N6489 Other specified disorders of breast: Secondary | ICD-10-CM | POA: Diagnosis not present

## 2016-02-03 LAB — POCT UA - MICROALBUMIN
Albumin/Creatinine Ratio, Urine, POC: <30
Creatinine, POC: 50 mg/dL
Microalbumin Ur, POC: 10 mg/L

## 2016-02-03 MED ORDER — LIRAGLUTIDE 18 MG/3ML ~~LOC~~ SOPN: 1.8000 mg | PEN_INJECTOR | Freq: Every day | SUBCUTANEOUS | 2 refills | Status: DC

## 2016-02-03 NOTE — Patient Instructions (Signed)
Decreased Lantus to 24 units Increase Victoza to 1.8 mg.

## 2016-02-03 NOTE — Progress Notes (Signed)
Subjective:    CC: DM  HPI:  Diabetes - no hypoglycemic events. No wounds or sores that are not healing well. No increased thirst or urination. Checking glucose at home. Taking medications as prescribed without any side effects. She has been doing much better. She did cut out the sweet tea and has also cut out the eating pancakes with syrup and has been doing better without as well. She is up to1.2 mg on the Victoza.  Pendulous rest with chronic low back pain and spondylolisthesis at L4-5. I did write a letter to her plastic surgeon letting her know that I think she is able to move forward with her current A1c and her recent blood glucose levels for surgery.   Objective:    General: Well Developed, well nourished, and in no acute distress.  Neuro: Alert and oriented x3, extra-ocular muscles intact, sensation grossly intact.  HEENT: Normocephalic, atraumatic  Skin: Warm and dry, no rashes. Cardiac: Regular rate and rhythm, no murmurs rubs or gallops, no lower extremity edema.  Respiratory: Clear to auscultation bilaterally. Not using accessory muscles, speaking in full sentences.   Impression and Recommendations:    DM- Last A1c was down to 7.7 in August. She's not quite due for repeat A1c yet but she did bring in her glucose log. Overall they look really good. She had a few elevated blood sugars but the majority of them look fantastic. Did encourage her to continue to work on diet and exercise. Can increase her Victoza to 1.8 mg and decrease her Lantus by 2 units down to 24 since she has had a couple of hypoglycemic episodes. Otherwise continue current regimen. Follow-up at the end of November for Nexium 11 A1c. I did write a letter to her surgeons that she could go ahead and move forward with surgery so hopefully that'll get her scheduled for that soon.  Pendulous breasts with chronic low back pain secondary to spondylolisthesis of the lumbar spine-hopefully will get her surgery scheduled  in the next week or 2.

## 2016-02-04 DIAGNOSIS — E119 Type 2 diabetes mellitus without complications: Secondary | ICD-10-CM | POA: Diagnosis not present

## 2016-02-05 LAB — COMPLETE METABOLIC PANEL WITH GFR
ALT: 16 U/L (ref 6–29)
AST: 19 U/L (ref 10–35)
Albumin: 3.8 g/dL (ref 3.6–5.1)
Alkaline Phosphatase: 96 U/L (ref 33–130)
BUN: 8 mg/dL (ref 7–25)
CO2: 26 mmol/L (ref 20–31)
Calcium: 8.8 mg/dL (ref 8.6–10.4)
Chloride: 105 mmol/L (ref 98–110)
Creat: 0.76 mg/dL (ref 0.50–0.99)
GFR, Est African American: >89 mL/min (ref >=60)
GFR, Est Non African American: 84 mL/min (ref >=60)
Glucose, Bld: 107 mg/dL — ABNORMAL HIGH (ref 65–99)
Potassium: 4.2 mmol/L (ref 3.5–5.3)
Sodium: 141 mmol/L (ref 135–146)
Total Bilirubin: 0.3 mg/dL (ref 0.2–1.2)
Total Protein: 6.7 g/dL (ref 6.1–8.1)

## 2016-02-05 LAB — LIPID PANEL
Cholesterol: 138 mg/dL (ref 125–200)
HDL: 42 mg/dL — ABNORMAL LOW (ref >=46)
LDL Cholesterol: 73 mg/dL (ref <130)
Total CHOL/HDL Ratio: 3.3 Ratio (ref <=5.0)
Triglycerides: 113 mg/dL (ref <150)
VLDL: 23 mg/dL (ref <30)

## 2016-02-05 NOTE — Progress Notes (Signed)
All labs are normal. 

## 2016-02-18 ENCOUNTER — Other Ambulatory Visit: Payer: Self-pay | Admitting: Family Medicine

## 2016-03-01 ENCOUNTER — Other Ambulatory Visit: Payer: Self-pay | Admitting: Family Medicine

## 2016-03-03 ENCOUNTER — Other Ambulatory Visit: Payer: Self-pay | Admitting: Family Medicine

## 2016-03-10 ENCOUNTER — Encounter (HOSPITAL_BASED_OUTPATIENT_CLINIC_OR_DEPARTMENT_OTHER): Payer: Self-pay | Admitting: *Deleted

## 2016-03-12 ENCOUNTER — Ambulatory Visit: Payer: Self-pay | Admitting: Plastic Surgery

## 2016-03-12 DIAGNOSIS — N62 Hypertrophy of breast: Secondary | ICD-10-CM

## 2016-03-16 ENCOUNTER — Encounter (HOSPITAL_BASED_OUTPATIENT_CLINIC_OR_DEPARTMENT_OTHER)
Admission: RE | Admit: 2016-03-16 | Discharge: 2016-03-16 | Disposition: A | Discharge status: Home / Self Care | Payer: Medicare Other | Source: Ambulatory Visit | Attending: Plastic Surgery | Admitting: Plastic Surgery

## 2016-03-16 DIAGNOSIS — Z888 Allergy status to other drugs, medicaments and biological substances status: Secondary | ICD-10-CM | POA: Diagnosis not present

## 2016-03-16 DIAGNOSIS — Z880 Allergy status to penicillin: Secondary | ICD-10-CM | POA: Diagnosis not present

## 2016-03-16 DIAGNOSIS — N6092 Unspecified benign mammary dysplasia of left breast: Secondary | ICD-10-CM | POA: Diagnosis not present

## 2016-03-16 DIAGNOSIS — Z811 Family history of alcohol abuse and dependence: Secondary | ICD-10-CM | POA: Diagnosis not present

## 2016-03-16 DIAGNOSIS — Z79899 Other long term (current) drug therapy: Secondary | ICD-10-CM | POA: Diagnosis not present

## 2016-03-16 DIAGNOSIS — E119 Type 2 diabetes mellitus without complications: Secondary | ICD-10-CM | POA: Diagnosis not present

## 2016-03-16 DIAGNOSIS — N62 Hypertrophy of breast: Secondary | ICD-10-CM | POA: Diagnosis not present

## 2016-03-16 DIAGNOSIS — K449 Diaphragmatic hernia without obstruction or gangrene: Secondary | ICD-10-CM | POA: Diagnosis not present

## 2016-03-16 DIAGNOSIS — Z90710 Acquired absence of both cervix and uterus: Secondary | ICD-10-CM | POA: Diagnosis not present

## 2016-03-16 DIAGNOSIS — K219 Gastro-esophageal reflux disease without esophagitis: Secondary | ICD-10-CM | POA: Diagnosis not present

## 2016-03-16 DIAGNOSIS — Z7982 Long term (current) use of aspirin: Secondary | ICD-10-CM | POA: Diagnosis not present

## 2016-03-16 DIAGNOSIS — N6012 Diffuse cystic mastopathy of left breast: Secondary | ICD-10-CM | POA: Diagnosis present

## 2016-03-16 DIAGNOSIS — M549 Dorsalgia, unspecified: Secondary | ICD-10-CM | POA: Diagnosis not present

## 2016-03-16 DIAGNOSIS — Z885 Allergy status to narcotic agent status: Secondary | ICD-10-CM | POA: Diagnosis not present

## 2016-03-16 DIAGNOSIS — G43909 Migraine, unspecified, not intractable, without status migrainosus: Secondary | ICD-10-CM | POA: Diagnosis not present

## 2016-03-16 DIAGNOSIS — Z87891 Personal history of nicotine dependence: Secondary | ICD-10-CM | POA: Diagnosis not present

## 2016-03-16 DIAGNOSIS — Z833 Family history of diabetes mellitus: Secondary | ICD-10-CM | POA: Diagnosis not present

## 2016-03-16 DIAGNOSIS — Z8249 Family history of ischemic heart disease and other diseases of the circulatory system: Secondary | ICD-10-CM | POA: Diagnosis not present

## 2016-03-16 DIAGNOSIS — I739 Peripheral vascular disease, unspecified: Secondary | ICD-10-CM | POA: Diagnosis not present

## 2016-03-16 DIAGNOSIS — M542 Cervicalgia: Secondary | ICD-10-CM | POA: Diagnosis not present

## 2016-03-16 DIAGNOSIS — N6091 Unspecified benign mammary dysplasia of right breast: Secondary | ICD-10-CM | POA: Diagnosis not present

## 2016-03-16 DIAGNOSIS — Z6839 Body mass index (BMI) 39.0-39.9, adult: Secondary | ICD-10-CM | POA: Diagnosis not present

## 2016-03-16 DIAGNOSIS — Z794 Long term (current) use of insulin: Secondary | ICD-10-CM | POA: Diagnosis not present

## 2016-03-16 DIAGNOSIS — Z823 Family history of stroke: Secondary | ICD-10-CM | POA: Diagnosis not present

## 2016-03-16 LAB — BASIC METABOLIC PANEL
Anion gap: 8 (ref 5–15)
BUN: 8 mg/dL (ref 6–20)
CO2: 26 mmol/L (ref 22–32)
Calcium: 9 mg/dL (ref 8.9–10.3)
Chloride: 107 mmol/L (ref 101–111)
Creatinine, Ser: 0.7 mg/dL (ref 0.44–1.00)
GFR calc Af Amer: >60 mL/min (ref >60)
GFR calc non Af Amer: >60 mL/min (ref >60)
Glucose, Bld: 93 mg/dL (ref 65–99)
Potassium: 3.9 mmol/L (ref 3.5–5.1)
Sodium: 141 mmol/L (ref 135–145)

## 2016-03-16 NOTE — Progress Notes (Signed)
ekg reviewed by Dr Senaida Ores no further testing needed

## 2016-03-17 ENCOUNTER — Ambulatory Visit (HOSPITAL_BASED_OUTPATIENT_CLINIC_OR_DEPARTMENT_OTHER): Payer: Medicare Other | Admitting: Anesthesiology

## 2016-03-17 ENCOUNTER — Encounter (HOSPITAL_BASED_OUTPATIENT_CLINIC_OR_DEPARTMENT_OTHER): Payer: Self-pay | Admitting: *Deleted

## 2016-03-17 ENCOUNTER — Encounter (HOSPITAL_BASED_OUTPATIENT_CLINIC_OR_DEPARTMENT_OTHER)
Admission: RE | Disposition: A | Discharge status: Home / Self Care | Payer: Self-pay | Source: Ambulatory Visit | Attending: Plastic Surgery

## 2016-03-17 ENCOUNTER — Ambulatory Visit (HOSPITAL_BASED_OUTPATIENT_CLINIC_OR_DEPARTMENT_OTHER)
Admission: RE | Admit: 2016-03-17 | Discharge: 2016-03-17 | Disposition: A | Discharge status: Home / Self Care | Payer: Medicare Other | Source: Ambulatory Visit | Attending: Plastic Surgery | Admitting: Plastic Surgery

## 2016-03-17 DIAGNOSIS — M549 Dorsalgia, unspecified: Secondary | ICD-10-CM | POA: Insufficient documentation

## 2016-03-17 DIAGNOSIS — M542 Cervicalgia: Secondary | ICD-10-CM | POA: Diagnosis not present

## 2016-03-17 DIAGNOSIS — G43909 Migraine, unspecified, not intractable, without status migrainosus: Secondary | ICD-10-CM | POA: Insufficient documentation

## 2016-03-17 DIAGNOSIS — Z885 Allergy status to narcotic agent status: Secondary | ICD-10-CM | POA: Insufficient documentation

## 2016-03-17 DIAGNOSIS — E119 Type 2 diabetes mellitus without complications: Secondary | ICD-10-CM | POA: Insufficient documentation

## 2016-03-17 DIAGNOSIS — N6092 Unspecified benign mammary dysplasia of left breast: Secondary | ICD-10-CM | POA: Insufficient documentation

## 2016-03-17 DIAGNOSIS — Z7982 Long term (current) use of aspirin: Secondary | ICD-10-CM | POA: Insufficient documentation

## 2016-03-17 DIAGNOSIS — K219 Gastro-esophageal reflux disease without esophagitis: Secondary | ICD-10-CM | POA: Insufficient documentation

## 2016-03-17 DIAGNOSIS — N62 Hypertrophy of breast: Secondary | ICD-10-CM | POA: Diagnosis not present

## 2016-03-17 DIAGNOSIS — Z833 Family history of diabetes mellitus: Secondary | ICD-10-CM | POA: Insufficient documentation

## 2016-03-17 DIAGNOSIS — I739 Peripheral vascular disease, unspecified: Secondary | ICD-10-CM | POA: Insufficient documentation

## 2016-03-17 DIAGNOSIS — Z90710 Acquired absence of both cervix and uterus: Secondary | ICD-10-CM | POA: Diagnosis not present

## 2016-03-17 DIAGNOSIS — Z794 Long term (current) use of insulin: Secondary | ICD-10-CM | POA: Insufficient documentation

## 2016-03-17 DIAGNOSIS — Z880 Allergy status to penicillin: Secondary | ICD-10-CM | POA: Insufficient documentation

## 2016-03-17 DIAGNOSIS — Z87891 Personal history of nicotine dependence: Secondary | ICD-10-CM | POA: Insufficient documentation

## 2016-03-17 DIAGNOSIS — K449 Diaphragmatic hernia without obstruction or gangrene: Secondary | ICD-10-CM | POA: Diagnosis not present

## 2016-03-17 DIAGNOSIS — N6012 Diffuse cystic mastopathy of left breast: Secondary | ICD-10-CM | POA: Diagnosis not present

## 2016-03-17 DIAGNOSIS — Z79899 Other long term (current) drug therapy: Secondary | ICD-10-CM | POA: Insufficient documentation

## 2016-03-17 DIAGNOSIS — N6091 Unspecified benign mammary dysplasia of right breast: Secondary | ICD-10-CM | POA: Diagnosis not present

## 2016-03-17 DIAGNOSIS — I10 Essential (primary) hypertension: Secondary | ICD-10-CM | POA: Diagnosis not present

## 2016-03-17 DIAGNOSIS — Z888 Allergy status to other drugs, medicaments and biological substances status: Secondary | ICD-10-CM | POA: Insufficient documentation

## 2016-03-17 DIAGNOSIS — Z8249 Family history of ischemic heart disease and other diseases of the circulatory system: Secondary | ICD-10-CM | POA: Insufficient documentation

## 2016-03-17 DIAGNOSIS — Z823 Family history of stroke: Secondary | ICD-10-CM | POA: Insufficient documentation

## 2016-03-17 DIAGNOSIS — Z811 Family history of alcohol abuse and dependence: Secondary | ICD-10-CM | POA: Insufficient documentation

## 2016-03-17 DIAGNOSIS — Z6839 Body mass index (BMI) 39.0-39.9, adult: Secondary | ICD-10-CM | POA: Insufficient documentation

## 2016-03-17 DIAGNOSIS — N6011 Diffuse cystic mastopathy of right breast: Secondary | ICD-10-CM | POA: Diagnosis not present

## 2016-03-17 HISTORY — PX: BREAST REDUCTION SURGERY: SHX8

## 2016-03-17 LAB — GLUCOSE, CAPILLARY
Glucose-Capillary: 116 mg/dL — ABNORMAL HIGH (ref 65–99)
Glucose-Capillary: 180 mg/dL — ABNORMAL HIGH (ref 65–99)

## 2016-03-17 SURGERY — MAMMOPLASTY, REDUCTION
Anesthesia: General | Site: Chest | Laterality: Bilateral

## 2016-03-17 MED ORDER — BUPIVACAINE HCL (PF) 0.5 % IJ SOLN
INTRAMUSCULAR | Status: AC
  Filled 2016-03-17: qty 30

## 2016-03-17 MED ORDER — CIPROFLOXACIN IN D5W 400 MG/200ML IV SOLN
400.0000 mg | INTRAVENOUS | Status: AC
  Administered 2016-03-17: 400 mg via INTRAVENOUS

## 2016-03-17 MED ORDER — MEPERIDINE HCL 25 MG/ML IJ SOLN: 6.2500 mg | INTRAMUSCULAR | Status: DC | PRN

## 2016-03-17 MED ORDER — EPHEDRINE SULFATE 50 MG/ML IJ SOLN
INTRAMUSCULAR | Status: DC | PRN
  Administered 2016-03-17: 15 mg via INTRAVENOUS
  Administered 2016-03-17: 10 mg via INTRAVENOUS

## 2016-03-17 MED ORDER — PHENYLEPHRINE HCL 10 MG/ML IJ SOLN
INTRAMUSCULAR | Status: DC | PRN
  Administered 2016-03-17: 80 ug via INTRAVENOUS

## 2016-03-17 MED ORDER — PROMETHAZINE HCL 25 MG/ML IJ SOLN: 6.2500 mg | INTRAMUSCULAR | Status: DC | PRN

## 2016-03-17 MED ORDER — OXYCODONE HCL 5 MG PO TABS: 5.0000 mg | ORAL_TABLET | Freq: Once | ORAL | Status: DC | PRN

## 2016-03-17 MED ORDER — LIDOCAINE HCL (PF) 1 % IJ SOLN
INTRAMUSCULAR | Status: AC
  Filled 2016-03-17: qty 30

## 2016-03-17 MED ORDER — OXYCODONE HCL 5 MG/5ML PO SOLN: 5.0000 mg | Freq: Once | ORAL | Status: DC | PRN

## 2016-03-17 MED ORDER — PHENYLEPHRINE 40 MCG/ML (10ML) SYRINGE FOR IV PUSH (FOR BLOOD PRESSURE SUPPORT)
PREFILLED_SYRINGE | INTRAVENOUS | Status: AC
  Filled 2016-03-17: qty 10

## 2016-03-17 MED ORDER — HYDROMORPHONE HCL 1 MG/ML IJ SOLN
0.2500 mg | INTRAMUSCULAR | Status: DC | PRN
Start: 2016-03-17 — End: 2016-03-17
  Administered 2016-03-17 (×2): 0.5 mg via INTRAVENOUS

## 2016-03-17 MED ORDER — DEXAMETHASONE SODIUM PHOSPHATE 10 MG/ML IJ SOLN
INTRAMUSCULAR | Status: AC
  Filled 2016-03-17: qty 1

## 2016-03-17 MED ORDER — LIDOCAINE-EPINEPHRINE (PF) 1 %-1:200000 IJ SOLN
INTRAMUSCULAR | Status: DC | PRN
  Administered 2016-03-17: 20 mL

## 2016-03-17 MED ORDER — ONDANSETRON HCL 4 MG/2ML IJ SOLN
INTRAMUSCULAR | Status: DC | PRN
  Administered 2016-03-17: 4 mg via INTRAVENOUS

## 2016-03-17 MED ORDER — CIPROFLOXACIN IN D5W 400 MG/200ML IV SOLN
INTRAVENOUS | Status: AC
  Filled 2016-03-17: qty 200

## 2016-03-17 MED ORDER — LIDOCAINE HCL 1 % IJ SOLN
INTRAMUSCULAR | Status: DC | PRN
  Administered 2016-03-17: 50 mL

## 2016-03-17 MED ORDER — ATROPINE SULFATE 0.4 MG/ML IV SOSY
PREFILLED_SYRINGE | INTRAVENOUS | Status: AC
  Filled 2016-03-17: qty 2.5

## 2016-03-17 MED ORDER — SODIUM CHLORIDE 0.9 % IR SOLN
Status: DC | PRN
  Administered 2016-03-17: 500 mL

## 2016-03-17 MED ORDER — SUGAMMADEX SODIUM 200 MG/2ML IV SOLN
INTRAVENOUS | Status: DC | PRN
  Administered 2016-03-17: 220 mg via INTRAVENOUS

## 2016-03-17 MED ORDER — SUFENTANIL CITRATE 50 MCG/ML IV SOLN
INTRAVENOUS | Status: AC
  Filled 2016-03-17: qty 1

## 2016-03-17 MED ORDER — MIDAZOLAM HCL 5 MG/5ML IJ SOLN
INTRAMUSCULAR | Status: DC | PRN
  Administered 2016-03-17: 2 mg via INTRAVENOUS

## 2016-03-17 MED ORDER — LIDOCAINE HCL (CARDIAC) 20 MG/ML IV SOLN
INTRAVENOUS | Status: DC | PRN
  Administered 2016-03-17: 100 mg via INTRAVENOUS
  Administered 2016-03-17: 50 mg via INTRAVENOUS

## 2016-03-17 MED ORDER — LIDOCAINE-EPINEPHRINE 1.5 %-1:200,000 OPTIME - NO CHARGE: INTRAMUSCULAR | Status: DC | PRN

## 2016-03-17 MED ORDER — MIDAZOLAM HCL 2 MG/2ML IJ SOLN
INTRAMUSCULAR | Status: AC
  Filled 2016-03-17: qty 2

## 2016-03-17 MED ORDER — EPINEPHRINE PF 1 MG/ML IJ SOLN
INTRAMUSCULAR | Status: DC | PRN
  Administered 2016-03-17: 1 mL

## 2016-03-17 MED ORDER — EPHEDRINE 5 MG/ML INJ
INTRAVENOUS | Status: AC
  Filled 2016-03-17: qty 10

## 2016-03-17 MED ORDER — PHENYLEPHRINE HCL 10 MG/ML IJ SOLN: INTRAMUSCULAR | Status: DC | PRN

## 2016-03-17 MED ORDER — LIDOCAINE 2% (20 MG/ML) 5 ML SYRINGE
INTRAMUSCULAR | Status: AC
  Filled 2016-03-17: qty 5

## 2016-03-17 MED ORDER — SUCCINYLCHOLINE CHLORIDE 200 MG/10ML IV SOSY
PREFILLED_SYRINGE | INTRAVENOUS | Status: AC
  Filled 2016-03-17: qty 10

## 2016-03-17 MED ORDER — LACTATED RINGERS IV SOLN
INTRAVENOUS | Status: DC
  Administered 2016-03-17 (×3): via INTRAVENOUS

## 2016-03-17 MED ORDER — DEXAMETHASONE SODIUM PHOSPHATE 4 MG/ML IJ SOLN
INTRAMUSCULAR | Status: DC | PRN
  Administered 2016-03-17: 10 mg via INTRAVENOUS

## 2016-03-17 MED ORDER — LACTATED RINGERS IV SOLN
INTRAVENOUS | Status: DC | PRN
  Administered 2016-03-17: 1000 mL via INTRAVENOUS

## 2016-03-17 MED ORDER — ROCURONIUM BROMIDE 10 MG/ML (PF) SYRINGE
PREFILLED_SYRINGE | INTRAVENOUS | Status: AC
  Filled 2016-03-17: qty 10

## 2016-03-17 MED ORDER — ONDANSETRON HCL 4 MG/2ML IJ SOLN
INTRAMUSCULAR | Status: AC
  Filled 2016-03-17: qty 2

## 2016-03-17 MED ORDER — EPINEPHRINE 30 MG/30ML IJ SOLN
INTRAMUSCULAR | Status: AC
  Filled 2016-03-17: qty 1

## 2016-03-17 MED ORDER — PROPOFOL 10 MG/ML IV BOLUS
INTRAVENOUS | Status: DC | PRN
  Administered 2016-03-17: 150 mg via INTRAVENOUS

## 2016-03-17 MED ORDER — HYDROMORPHONE HCL 1 MG/ML IJ SOLN
INTRAMUSCULAR | Status: AC
  Filled 2016-03-17: qty 1

## 2016-03-17 MED ORDER — SUFENTANIL CITRATE 50 MCG/ML IV SOLN
INTRAVENOUS | Status: DC | PRN
  Administered 2016-03-17 (×2): 5 ug via INTRAVENOUS

## 2016-03-17 MED ORDER — ROCURONIUM BROMIDE 100 MG/10ML IV SOLN
INTRAVENOUS | Status: DC | PRN
Start: 2016-03-17 — End: 2016-03-17
  Administered 2016-03-17: 60 mg via INTRAVENOUS
  Administered 2016-03-17: 20 mg via INTRAVENOUS

## 2016-03-17 MED ORDER — PHENYLEPHRINE HCL 10 MG/ML IJ SOLN
INTRAMUSCULAR | Status: DC | PRN
  Administered 2016-03-17: 40 ug/min via INTRAVENOUS

## 2016-03-17 SURGICAL SUPPLY — 61 items
BAG DECANTER FOR FLEXI CONT (MISCELLANEOUS) ×2 IMPLANT
BINDER BREAST LRG (GAUZE/BANDAGES/DRESSINGS) IMPLANT
BINDER BREAST MEDIUM (GAUZE/BANDAGES/DRESSINGS) IMPLANT
BINDER BREAST XLRG (GAUZE/BANDAGES/DRESSINGS) IMPLANT
BINDER BREAST XXLRG (GAUZE/BANDAGES/DRESSINGS) ×2 IMPLANT
BLADE HEX COATED 2.75 (ELECTRODE) ×2 IMPLANT
BLADE KNIFE PERSONA 10 (BLADE) ×14 IMPLANT
BLADE SURG 15 STRL LF DISP TIS (BLADE) ×1 IMPLANT
BLADE SURG 15 STRL SS (BLADE) ×1
BNDG GAUZE ELAST 4 BULKY (GAUZE/BANDAGES/DRESSINGS) IMPLANT
CANISTER SUCT 1200ML W/VALVE (MISCELLANEOUS) ×2 IMPLANT
CHLORAPREP W/TINT 26ML (MISCELLANEOUS) ×4 IMPLANT
COVER BACK TABLE 60X90IN (DRAPES) ×2 IMPLANT
COVER MAYO STAND STRL (DRAPES) ×2 IMPLANT
DECANTER SPIKE VIAL GLASS SM (MISCELLANEOUS) IMPLANT
DERMABOND ADVANCED (GAUZE/BANDAGES/DRESSINGS) ×3
DERMABOND ADVANCED .7 DNX12 (GAUZE/BANDAGES/DRESSINGS) ×3 IMPLANT
DRAIN CHANNEL 19F RND (DRAIN) IMPLANT
DRAPE LAPAROSCOPIC ABDOMINAL (DRAPES) ×2 IMPLANT
DRSG PAD ABDOMINAL 8X10 ST (GAUZE/BANDAGES/DRESSINGS) ×4 IMPLANT
ELECT BLADE 4.0 EZ CLEAN MEGAD (MISCELLANEOUS) ×2
ELECT REM PT RETURN 9FT ADLT (ELECTROSURGICAL) ×2
ELECTRODE BLDE 4.0 EZ CLN MEGD (MISCELLANEOUS) ×1 IMPLANT
ELECTRODE REM PT RTRN 9FT ADLT (ELECTROSURGICAL) ×1 IMPLANT
EVACUATOR SILICONE 100CC (DRAIN) IMPLANT
FILTER LIPOSUCTION (MISCELLANEOUS) IMPLANT
GLOVE BIO SURGEON STRL SZ 6.5 (GLOVE) ×8 IMPLANT
GOWN STRL REUS W/ TWL LRG LVL3 (GOWN DISPOSABLE) ×3 IMPLANT
GOWN STRL REUS W/TWL LRG LVL3 (GOWN DISPOSABLE) ×3
NDL SAFETY ECLIPSE 18X1.5 (NEEDLE) IMPLANT
NEEDLE HYPO 18GX1.5 SHARP (NEEDLE)
NEEDLE HYPO 25X1 1.5 SAFETY (NEEDLE) ×2 IMPLANT
NS IRRIG 1000ML POUR BTL (IV SOLUTION) IMPLANT
PACK BASIN DAY SURGERY FS (CUSTOM PROCEDURE TRAY) ×2 IMPLANT
PAD ALCOHOL SWAB (MISCELLANEOUS) IMPLANT
PENCIL BUTTON HOLSTER BLD 10FT (ELECTRODE) ×2 IMPLANT
SLEEVE SCD COMPRESS KNEE MED (MISCELLANEOUS) ×2 IMPLANT
SPONGE LAP 18X18 X RAY DECT (DISPOSABLE) ×8 IMPLANT
STRIP SUTURE WOUND CLOSURE 1/2 (SUTURE) ×4 IMPLANT
SUT MNCRL AB 4-0 PS2 18 (SUTURE) ×12 IMPLANT
SUT MON AB 3-0 SH 27 (SUTURE) ×8
SUT MON AB 3-0 SH27 (SUTURE) ×8 IMPLANT
SUT MON AB 5-0 PS2 18 (SUTURE) ×16 IMPLANT
SUT PDS 3-0 CT2 (SUTURE)
SUT PDS AB 2-0 CT2 27 (SUTURE) IMPLANT
SUT PDS II 3-0 CT2 27 ABS (SUTURE) IMPLANT
SUT SILK 3 0 PS 1 (SUTURE) IMPLANT
SUT VIC AB 3-0 SH 27 (SUTURE)
SUT VIC AB 3-0 SH 27X BRD (SUTURE) IMPLANT
SUT VICRYL 4-0 PS2 18IN ABS (SUTURE) IMPLANT
SYR 3ML 23GX1 SAFETY (SYRINGE) ×2 IMPLANT
SYR 50ML LL SCALE MARK (SYRINGE) IMPLANT
SYR BULB IRRIGATION 50ML (SYRINGE) ×2 IMPLANT
SYR CONTROL 10ML LL (SYRINGE) ×2 IMPLANT
TAPE MEASURE VINYL STERILE (MISCELLANEOUS) ×2 IMPLANT
TOWEL OR 17X24 6PK STRL BLUE (TOWEL DISPOSABLE) ×4 IMPLANT
TUBE CONNECTING 20X1/4 (TUBING) ×2 IMPLANT
TUBING INFILTRATION IT-10001 (TUBING) IMPLANT
TUBING SET GRADUATE ASPIR 12FT (MISCELLANEOUS) IMPLANT
UNDERPAD 30X30 (UNDERPADS AND DIAPERS) ×4 IMPLANT
YANKAUER SUCT BULB TIP NO VENT (SUCTIONS) ×2 IMPLANT

## 2016-03-17 NOTE — Anesthesia Preprocedure Evaluation (Signed)
Anesthesia Evaluation  Patient identified by MRN, date of birth, ID band Patient awake    Reviewed: Allergy & Precautions, H&P , NPO status , Patient's Chart, lab work & pertinent test results  History of Anesthesia Complications Negative for: history of anesthetic complications  Airway Mallampati: I  TM Distance: >3 FB Neck ROM: full    Dental  (+) Poor Dentition, Missing   Pulmonary former smoker,    Pulmonary exam normal breath sounds clear to auscultation       Cardiovascular hypertension, + Peripheral Vascular Disease  Normal cardiovascular exam Rhythm:regular Rate:Normal     Neuro/Psych  Headaches,    GI/Hepatic Neg liver ROS, hiatal hernia, GERD  ,  Endo/Other  diabetesMorbid obesity  Renal/GU negative Renal ROS     Musculoskeletal   Abdominal   Peds  Hematology negative hematology ROS (+)   Anesthesia Other Findings   Reproductive/Obstetrics negative OB ROS                             Anesthesia Physical  Anesthesia Plan  ASA: III  Anesthesia Plan: General   Post-op Pain Management:    Induction: Intravenous  Airway Management Planned: Oral ETT  Additional Equipment: None  Intra-op Plan:   Post-operative Plan: Extubation in OR  Informed Consent: I have reviewed the patients History and Physical, chart, labs and discussed the procedure including the risks, benefits and alternatives for the proposed anesthesia with the patient or authorized representative who has indicated his/her understanding and acceptance.   Dental Advisory Given  Plan Discussed with: Anesthesiologist, CRNA and Surgeon  Anesthesia Plan Comments: (Given chronic pain would treat with ketamine following induction, 1mg /kg total prior to incision)        Anesthesia Quick Evaluation

## 2016-03-17 NOTE — Op Note (Signed)
Breast Reduction Op note:    DATE OF PROCEDURE: 03/17/2016  LOCATION: [place]  SURGEON: Lyndee Leo Sanger Dillingham, DO  ASSISTANT: Shawn Rayburn, PA  PREOPERATIVE DIAGNOSIS 1. Macromastia 2. Neck Pain 3. Back Pain  POSTOPERATIVE DIAGNOSIS 1. Macromastia 2. Neck Pain 3. Back Pain  PROCEDURES 1. Bilateral breast reduction.  Right reduction 711g, Left reduction [id number]  COMPLICATIONS: None.  DRAINS: none  INDICATIONS FOR PROCEDURE Amanda Morris is a 65 y.o. year old female born on 06/22/1950, with a history of symptomatic macromastia with concominant back pain, neck pain, shoulder grooving from her bra.   MRN: [id number]  CONSENT Informed consent was obtained directly from the patient. The risks, benefits and alternatives were fully discussed. Specific risks including but not limited to bleeding, infection, hematoma, seroma, scarring, pain, nipple necrosis, asymmetry, poor cosmetic results, and need for further surgery were discussed. The patient had ample opportunity to have her questions answered to her satisfaction.  DESCRIPTION OF PROCEDURE  Patient was brought into the operating room and placed in a supine position.  SCDs were placed and appropriate padding was performed.  Antibiotics were given. The patient underwent general anesthesia and the chest was prepped and draped in a sterile fashion.  A timeout was performed and all information was confirmed to be correct. Tumescent was placed in each breast in the lateral aspect.    Right:   Preoperative markings were confirmed.  Incision lines were injected with 1% Xylocaine with epinephrine.  After waiting for vasoconstriction, the marked lines were incised.  An inferior pedical breast reduction was performed by de-epithelializing the pedicle, using bovie to create the lateral and medial pedicles, and removing breast tissue from the superior, lateral, and medial portions of the breast.  Care was taken to  not undermine the breast pedicle. Hemostasis was achieved.  The nipple was gently rotated into position and the skin was temporarily closed with staples.  The patient was sat upright and size and shape symmetry was confirmed.  The pocket was irrigated and hemostasis confirmed.  The deep tissues were approximated with 3-0 Monocryl sutures and the skin was closed with deep dermal and subcuticular 4-0 Monocryl sutures followed by 5-0 Monocryl. Liposuction was done laterally to improve contour and decrease the bulk.  The nipple areola complex was brought out with the skin de-epithelialized at the location to make place for the complex.  The area was secured with 4-0 Vicryl at the deep layers followed by 5-0 Monocryl.  The nipple and skin flaps had good capillary refill at the end of the procedure.  Left:   Preoperative markings were confirmed.  Incision lines were injected with 1% Xylocaine with epinephrine.  After waiting for vasoconstriction, the marked lines were incised.  An inferior pedical breast reduction was performed by de-epithelializing the pedicle, using bovie to create the lateral and medial pedicles, and removing breast tissue from the superior, lateral, and medial portions of the breast.  Care was taken to not undermine the breast pedicle. Hemostasis was achieved.  The nipple was gently rotated into position and the skin was temporarily closed with staples.  The patient was sat upright and size and shape symmetry was confirmed.  The pocket was irrigated and hemostasis confirmed.  The deep tissues were approximated with 3-0 Monocryll sutures and the skin was closed with deep dermal and subcuticular 4-0 Monocryl sutures followed by 5-0 Monocryl. Liposuction was done laterally to improve contour and decrease the bulk.   The nipple areola complex was brought  out with the skin de-epithelialized at the location to make place for the complex.  The area was secured with 4-0 Vicryl at the deep layers followed by  5-0 Monocryl.  The nipple and skin flaps had good capillary refill at the end of the procedure.  The patient tolerated the procedure well. The patient was allowed to wake from anesthesia and taken to the recovery room in satisfactory condition.

## 2016-03-17 NOTE — Transfer of Care (Signed)
Immediate Anesthesia Transfer of Care Note  Patient: Amanda Morris  Procedure(s) Performed: Procedure(s): MAMMARY REDUCTION  (BREAST) (Bilateral)  Patient Location: PACU  Anesthesia Type:General  Level of Consciousness: awake, alert  and oriented  Airway & Oxygen Therapy: Patient Spontanous Breathing and Patient connected to face mask oxygen  Post-op Assessment: Report given to RN and Post -op Vital signs reviewed and stable  Post vital signs: Reviewed and stable  Last Vitals:  Vitals:   03/17/16 0732  BP: 115/77  Pulse: 94  Resp: 20  Temp: 37 C    Last Pain:  Vitals:   03/17/16 0732  TempSrc: Oral         Complications: No apparent anesthesia complications

## 2016-03-17 NOTE — H&P (Signed)
Amanda Morris is an 65 y.o. female.   Chief Complaint: Mammary hypertrophy HPI: The patient is a 65 yrs old bf here for treatment of mammary hypertrophy.  She has extremely large breasts causing symptoms that include the following: Back pain (upper and lower) and neck pain. She frequently pins bra cups higher on straps for better lift and relief. Notices relief when holding breast up in her hands. Shoulder straps causing grooves, pain occasionally requiring padding. Pain medication is sometimes required with motrin and tylenol. She underwent lower spine surgery 2 months ago with hopes of pain relieve. It has been minimal relief which is thought to be partially contributed to the excess breast weight.  Her breasts are extremely large and fairly symmetric. She has hyperpigmentation of the inframammary area on both sides. The sternal to nipple distance on the right is 36and the left is 36. The IMF distance is 18cm. She is 5 feet 4inches tall and weighs 214pounds. Preoperative bra size = 44DDcup. The estimated excess breast tissue to be removed at the time of surgery is 700grams on the left and 700grams on the right. Her last mammogram in 2016 was negative.  Past Medical History:  Diagnosis Date  . Allergy   . Diabetes mellitus without complication ([place])   . GERD (gastroesophageal reflux disease)   . Headache    migraines last one 2 weeks ago  . History of hiatal hernia   . Palpitations    in the past, no current issues per patient    Past Surgical History:  Procedure Laterality Date  . ABDOMINAL HYSTERECTOMY  1982  . BACK SURGERY    . SHOULDER SURGERY  06/2008, 09/2010   Right, by Dr. Karie Soda, then Dr. Judeth Horn    Family History  Problem Relation Age of Onset  . Heart disease Mother   . Diabetes Mother   . Hypertension Mother   . Stroke Mother   . Heart disease Father   . Heart disease Sister   . Diabetes Sister   . Hyperlipidemia Sister   . Hypertension Sister    . Stroke Sister   . Alcohol abuse Brother   . Hyperlipidemia Brother    Social History:  reports that she quit smoking about 8 years ago. Her smoking use included Cigarettes. She has never used smokeless tobacco. She reports that she does not drink alcohol or use drugs.  Allergies:  Allergies  Allergen Reactions  . Penicillins Hives  . Codeine Itching  . Metformin And Related Other (See Comments)    Nausea and diarrhea.     Medications Prior to Admission  Medication Sig Dispense Refill  . aspirin EC 81 MG tablet Take 81 mg by mouth daily.    . B-D ULTRAFINE III SHORT PEN 31G X 8 MM MISC USE AS DIRECTED 100 each prn  . Blood Glucose Monitoring Suppl (ACCU-CHEK AVIVA PLUS) w/Device KIT For testing 3 times a day. Dx. E11.9 1 kit 0  . DEXILANT 60 MG capsule Take 1 capsule by mouth daily.  3  . famotidine (PEPCID) 40 MG tablet Take 40 mg by mouth every evening.  11  . furosemide (LASIX) 20 MG tablet Take 1 tablet (20 mg total) by mouth daily as needed for fluid. 30 tablet 0  . gabapentin (NEURONTIN) 300 MG capsule Take 2 capsules (600 mg total) by mouth 3 (three) times daily. 180 capsule 3  . gabapentin (NEURONTIN) 300 MG capsule 1 TAB DAILY X 7 DAYS, THEN 1 TWICE DAILY X 7 DAYS,  THEN 1 THREE TIMES DAILY. MAX OF 3600MG/DAY 180 capsule 3  . glipiZIDE (GLUCOTROL) 10 MG tablet TAKE 1 TABLET (10 MG TOTAL) BY MOUTH 2 (TWO) TIMES DAILY BEFORE A MEAL. 180 tablet 1  . glucose blood test strip Use as instructed. For testing 3 times a day. Dx E11.9 100 each 12  . Insulin Glargine (LANTUS SOLOSTAR) 100 UNIT/ML Solostar Pen Inject into the skin.    Marland Kitchen LANCETS MICRO THIN 33G MISC For testing blood sugar 3 times daily dx e11.9 100 each 12  . liraglutide (VICTOZA) 18 MG/3ML SOPN Inject 0.3 mLs (1.8 mg total) into the skin daily. 9 mL 2  . naproxen sodium (ALEVE) 220 MG tablet Take 220-440 mg by mouth 2 (two) times daily as needed.    . nortriptyline (PAMELOR) 50 MG capsule     . perphenazine (TRILAFON)  2 MG tablet TAKE 1 TABLET BY MOUTH THREE TIMES A DAY WITH AMITRIP.  11  . pravastatin (PRAVACHOL) 40 MG tablet TAKE 1 TABLET (40 MG TOTAL) BY MOUTH QHS. 90 tablet 3  . sitaGLIPtin (JANUVIA) 100 MG tablet TAKE 1 TABLET (100 MG TOTAL) BY MOUTH DAILY. 90 tablet 1    Results for orders placed or performed during the hospital encounter of 03/17/16 (from the past 48 hour(s))  Basic metabolic panel     Status: None   Collection Time: 03/16/16  2:00 PM  Result Value Ref Range   Sodium 141 135 - 145 mmol/L   Potassium 3.9 3.5 - 5.1 mmol/L   Chloride 107 101 - 111 mmol/L   CO2 26 22 - 32 mmol/L   Glucose, Bld 93 65 - 99 mg/dL   BUN 8 6 - 20 mg/dL   Creatinine, Ser 0.70 0.44 - 1.00 mg/dL   Calcium 9.0 8.9 - 10.3 mg/dL   GFR calc non Af Amer >60 >60 mL/min   GFR calc Af Amer >60 >60 mL/min    Comment: (NOTE) The eGFR has been calculated using the CKD EPI equation. This calculation has not been validated in all clinical situations. eGFR's persistently <60 mL/min signify possible Chronic Kidney Disease.    Anion gap 8 5 - 15  Glucose, capillary     Status: Abnormal   Collection Time: 03/17/16  7:57 AM  Result Value Ref Range   Glucose-Capillary 116 (H) 65 - 99 mg/dL   No results found.  Review of Systems  Constitutional: Negative.   Eyes: Negative.   Respiratory: Negative.   Cardiovascular: Negative.   Gastrointestinal: Negative.   Genitourinary: Negative.   Musculoskeletal: Positive for back pain and neck pain.  Skin: Negative.   Neurological: Negative.   Psychiatric/Behavioral: Negative.     Blood pressure 115/77, pulse 94, temperature 98.6 F (37 C), temperature source Oral, resp. rate 20, height _0  (1.626 m), weight 103.4 kg (228 lb), SpO2 100 %. Physical Exam  Constitutional: She is oriented to person, place, and time. She appears well-developed and well-nourished.  HENT:  Head: Normocephalic and atraumatic.  Eyes: Pupils are equal, round, and reactive to light.   Cardiovascular: Normal rate.   Respiratory: No respiratory distress. She has no wheezes.  GI: Soft. She exhibits no distension. There is no tenderness.  Neurological: She is alert and oriented to person, place, and time.  Skin: Skin is warm.     Assessment/Plan Bilateral breast reduction.  Wallace Going, DO 03/17/2016, 8:08 AM

## 2016-03-17 NOTE — Anesthesia Postprocedure Evaluation (Signed)
Anesthesia Post Note  Patient: Amanda Morris  Procedure(s) Performed: Procedure(s) (LRB): MAMMARY REDUCTION  (BREAST) (Bilateral)  Patient location during evaluation: PACU Anesthesia Type: General Level of consciousness: sedated and patient cooperative Pain management: pain level controlled Vital Signs Assessment: post-procedure vital signs reviewed and stable Respiratory status: spontaneous breathing Cardiovascular status: stable Anesthetic complications: no    Last Vitals:  Vitals:   03/17/16 1315 03/17/16 1338  BP: 119/68 126/61  Pulse: 99 98  Resp: 19 16  Temp:  36.7 C    Last Pain:  Vitals:   03/17/16 1338  TempSrc: Oral  PainSc: [place]

## 2016-03-17 NOTE — Discharge Instructions (Signed)
May shower starting on Friday. No heavy lifting. Continue binder or sports bra.  Call your surgeon if you experience:   1.  Fever over 101.0. 2.  Inability to urinate. 3.  Nausea and/or vomiting. 4.  Extreme swelling or bruising at the surgical site. 5.  Continued bleeding from the incision. 6.  Increased pain, redness or drainage from the incision. 7.  Problems related to your pain medication. 8.  Any problems and/or concerns   Post Anesthesia Home Care Instructions  Activity: Get plenty of rest for the remainder of the day. A responsible adult should stay with you for 24 hours following the procedure.  For the next 24 hours, DO NOT: -Drive a car -Paediatric nurse -Drink alcoholic beverages -Take any medication unless instructed by your physician -Make any legal decisions or sign important papers.  Meals: Start with liquid foods such as gelatin or soup. Progress to regular foods as tolerated. Avoid greasy, spicy, heavy foods. If nausea and/or vomiting occur, drink only clear liquids until the nausea and/or vomiting subsides. Call your physician if vomiting continues.  Special Instructions/Symptoms: Your throat may feel dry or sore from the anesthesia or the breathing tube placed in your throat during surgery. If this causes discomfort, gargle with warm salt water. The discomfort should disappear within 24 hours.  If you had a scopolamine patch placed behind your ear for the management of post- operative nausea and/or vomiting:  1. The medication in the patch is effective for 72 hours, after which it should be removed.  Wrap patch in a tissue and discard in the trash. Wash hands thoroughly with soap and water. 2. You may remove the patch earlier than 72 hours if you experience unpleasant side effects which may include dry mouth, dizziness or visual disturbances. 3. Avoid touching the patch. Wash your hands with soap and water after contact with the patch.

## 2016-03-17 NOTE — Brief Op Note (Signed)
03/17/2016  8:33 AM  PATIENT:  Amanda Morris  65 y.o. female  PRE-OPERATIVE DIAGNOSIS:  BREAST HYPERTROPHY  POST-OPERATIVE DIAGNOSIS:  same  PROCEDURE:  Procedure(s): MAMMARY REDUCTION  (BREAST) (Bilateral)  SURGEON:  Surgeon(s) and Role:    Loel Lofty Dillingham, DO - Primary  PHYSICIAN ASSISTANT: Shawn Rayburn, PA  ASSISTANTS: none   ANESTHESIA:   general  EBL:  No intake/output data recorded.  BLOOD ADMINISTERED:none  DRAINS: none   LOCAL MEDICATIONS USED:  LIDOCAINE   SPECIMEN:  Source of Specimen:  bilateral breast tissue  DISPOSITION OF SPECIMEN:  PATHOLOGY  COUNTS:  YES  TOURNIQUET:  * No tourniquets in log *  DICTATION: .Dragon Dictation  PLAN OF CARE: Discharge to home after PACU  PATIENT DISPOSITION:  PACU - hemodynamically stable.   Delay start of Pharmacological VTE agent (>24hrs) due to surgical blood loss or risk of bleeding: no

## 2016-03-17 NOTE — Anesthesia Procedure Notes (Signed)
Procedure Name: Intubation Date/Time: 03/17/2016 8:33 AM Performed by: Melynda Ripple D Pre-anesthesia Checklist: Patient identified, Emergency Drugs available, Suction available and Patient being monitored Patient Re-evaluated:Patient Re-evaluated prior to inductionOxygen Delivery Method: Circle system utilized Preoxygenation: Pre-oxygenation with 100% oxygen Intubation Type: IV induction Ventilation: Mask ventilation without difficulty Laryngoscope Size: Mac and 3 Grade View: Grade I Tube type: Oral Number of attempts: 1 Airway Equipment and Method: Stylet and Oral airway Placement Confirmation: ETT inserted through vocal cords under direct vision,  positive ETCO2 and breath sounds checked- equal and bilateral Secured at: 22 cm Tube secured with: Tape Dental Injury: Teeth and Oropharynx as per pre-operative assessment

## 2016-03-22 ENCOUNTER — Encounter (HOSPITAL_BASED_OUTPATIENT_CLINIC_OR_DEPARTMENT_OTHER): Payer: Self-pay | Admitting: Plastic Surgery

## 2016-03-23 ENCOUNTER — Encounter: Payer: Self-pay | Admitting: Family Medicine

## 2016-03-23 ENCOUNTER — Ambulatory Visit (INDEPENDENT_AMBULATORY_CARE_PROVIDER_SITE_OTHER): Payer: Medicare Other | Admitting: Family Medicine

## 2016-03-23 VITALS — BP 107/76 | HR 121 | Temp 99.1°F | Ht 64.0 in | Wt 225.3 lb

## 2016-03-23 DIAGNOSIS — L301 Dyshidrosis [pompholyx]: Secondary | ICD-10-CM | POA: Diagnosis not present

## 2016-03-23 DIAGNOSIS — Z9889 Other specified postprocedural states: Secondary | ICD-10-CM | POA: Insufficient documentation

## 2016-03-23 DIAGNOSIS — I10 Essential (primary) hypertension: Secondary | ICD-10-CM | POA: Diagnosis not present

## 2016-03-23 DIAGNOSIS — E119 Type 2 diabetes mellitus without complications: Secondary | ICD-10-CM

## 2016-03-23 LAB — POCT GLYCOSYLATED HEMOGLOBIN (HGB A1C): Hemoglobin A1C: 7.6

## 2016-03-23 MED ORDER — GLIPIZIDE 10 MG PO TABS: ORAL_TABLET | ORAL | 1 refills | Status: DC

## 2016-03-23 MED ORDER — TRIAMCINOLONE ACETONIDE 0.5 % EX OINT: 1.0000 "application " | TOPICAL_OINTMENT | Freq: Every day | CUTANEOUS | 0 refills | Status: DC

## 2016-03-23 NOTE — Progress Notes (Signed)
Subjective:    CC: DM  HPI: Diabetes - no hypoglycemic events. No wounds or sores that are not healing well. No increased thirst or urination. Checking glucose at home. Taking medications as prescribed without any side effects. She did bring in a glucose log today. She is on 24 units of Lantus at night.  Glucose log over the last couple of weeks show that her fasting blood sugars range between 114 and 202 that that was an outlier. Most of them are running between 100 and 140. A couple of lows in the 70s during lunch time and her evening blood glucose is very. She finally had her breast reduction surgery about a week ago and is doing well from that. She has a follow-up with her surgeon later today. She feels like she is still continuing to do well with her diet but has not been able to exercise.  Hypertension- Pt denies chest pain, SOB, dizziness, or heart palpitations.  Taking meds as directed w/o problems.  Denies medication side effects.    Rash on the left palm of hand started over a month ago.  She says it's not itchy but it is uncomfortable. She says it's been thick and scaly and peeling. He is never had this before. She denies any new lotions perfume soaps for cleaning chemicals. She does not have anywhere else on her body.  Past medical history, Surgical history, Family history not pertinant except as noted below, Social history, Allergies, and medications have been entered into the medical record, reviewed, and corrections made.   Review of Systems: No fevers, chills, night sweats, weight loss, chest pain, or shortness of breath.   Objective:    General: Well Developed, well nourished, and in no acute distress.  Neuro: Alert and oriented x3, extra-ocular muscles intact, sensation grossly intact.  HEENT: Normocephalic, atraumatic  Skin: Warm and dry, no rashes. Cardiac: Regular rate and rhythm, no murmurs rubs or gallops, no lower extremity edema.  Respiratory: Clear to auscultation  bilaterally. Not using accessory muscles, speaking in full sentences.   Impression and Recommendations:   DM -   Uncontrolled. 11 A1c is 7.6 today. Slight improvement from previous. Continue work on [employer]. She is now on insulin and doing well at 24 units. Did encourage her start exercising as soon as she is released by her plastic surgeon to do so. Follow-up in 3 months. At the next A1c will be under 7. RF glipizide.   HTN - well controlled. Continue current regimen.  Rash on left hand-most consistent with dyshidrotic eczema. Will treat with topical steroid and application of lotion throughout the day. Call if not improving.

## 2016-05-05 DIAGNOSIS — Z86010 Personal history of colonic polyps: Secondary | ICD-10-CM | POA: Diagnosis not present

## 2016-05-05 DIAGNOSIS — K625 Hemorrhage of anus and rectum: Secondary | ICD-10-CM | POA: Diagnosis not present

## 2016-05-05 DIAGNOSIS — R112 Nausea with vomiting, unspecified: Secondary | ICD-10-CM | POA: Diagnosis not present

## 2016-05-18 DIAGNOSIS — M4316 Spondylolisthesis, lumbar region: Secondary | ICD-10-CM | POA: Diagnosis not present

## 2016-05-18 DIAGNOSIS — M4726 Other spondylosis with radiculopathy, lumbar region: Secondary | ICD-10-CM | POA: Diagnosis not present

## 2016-05-26 ENCOUNTER — Other Ambulatory Visit: Payer: Self-pay | Admitting: Family Medicine

## 2016-06-07 ENCOUNTER — Telehealth: Payer: Self-pay | Admitting: *Deleted

## 2016-06-07 NOTE — Telephone Encounter (Signed)
Trying to initiate a PA on Lantus. In progress notes it is noted that patient takes this medication but it is noted on med list as a historical med. I have a letter from her insurance company stating they have issued a temporary supply of the medication but and lantus is no longer on the forrmulary. Called the patient and left message asking her if she still has medication to take until appointment on 2/28 with [facility] and also how she takes it as this will make a difference as to what the med needs to [place] for.  Preferred meds are Levemir-tier 3 Tyler Aas -tier 3 Basaglar-tier 3 Levemir flextouch-tier 3

## 2016-06-08 ENCOUNTER — Telehealth: Payer: Self-pay | Admitting: *Deleted

## 2016-06-08 NOTE — Telephone Encounter (Signed)
Spoke with niece and she states she saw the patient's blood sugar log and noticed at least 4-5 log entries where the blood sugar was as low as 54. This am blood sugar before eating was around 50. The niece did confirm that she was taking all of her DM medications inluding lantus which I questioned since it was noted as a historical med.Confirmed that patient does take  26 units of lantus a day and all other DM meds and prescribed. Patient does have an appointment 2/28. Please advise

## 2016-06-08 NOTE — Telephone Encounter (Signed)
Ok to decrease Lantus to 24 units and monitor for lows over the next coupld of weeks.

## 2016-06-08 NOTE — Telephone Encounter (Signed)
Patient's niece, who is her caregiver,called back and states that the patient does have some of the lantus left and she confirmed that she is taking 26 units per day.

## 2016-06-08 NOTE — Telephone Encounter (Signed)
Patient notified and voiced understanding.

## 2016-06-09 ENCOUNTER — Telehealth: Payer: Self-pay | Admitting: Family Medicine

## 2016-06-09 MED ORDER — INSULIN DETEMIR 100 UNIT/ML FLEXPEN: 26.0000 [IU] | PEN_INJECTOR | Freq: Every day | SUBCUTANEOUS | 5 refills | Status: DC

## 2016-06-09 NOTE — Telephone Encounter (Signed)
Attempted to contact Pt, she was not available. Requested callback after 1330.

## 2016-06-09 NOTE — Telephone Encounter (Signed)
Please call patient. I am going to change her Lantus to Levemir. New prescription sent to the pharmacy.  Beatrice Lecher, MD

## 2016-06-09 NOTE — Telephone Encounter (Signed)
Pt advised , states she still has some lantus left. She will finish that Rx then start the Levemir.

## 2016-06-10 NOTE — Telephone Encounter (Signed)
See phone note. Levemir sent in place of lantus

## 2016-06-23 ENCOUNTER — Ambulatory Visit (INDEPENDENT_AMBULATORY_CARE_PROVIDER_SITE_OTHER): Payer: Medicare Other | Admitting: Family Medicine

## 2016-06-23 ENCOUNTER — Encounter: Payer: Self-pay | Admitting: Family Medicine

## 2016-06-23 VITALS — BP 126/74 | HR 93 | Ht 64.0 in | Wt 227.0 lb

## 2016-06-23 DIAGNOSIS — I10 Essential (primary) hypertension: Secondary | ICD-10-CM | POA: Diagnosis not present

## 2016-06-23 DIAGNOSIS — E119 Type 2 diabetes mellitus without complications: Secondary | ICD-10-CM | POA: Diagnosis not present

## 2016-06-23 DIAGNOSIS — L301 Dyshidrosis [pompholyx]: Secondary | ICD-10-CM | POA: Diagnosis not present

## 2016-06-23 LAB — POCT GLYCOSYLATED HEMOGLOBIN (HGB A1C): Hemoglobin A1C: 6.8

## 2016-06-23 MED ORDER — CLOBETASOL PROPIONATE 0.05 % EX OINT: 1.0000 "application " | TOPICAL_OINTMENT | Freq: Two times a day (BID) | CUTANEOUS | 0 refills | Status: DC

## 2016-06-23 MED ORDER — LIRAGLUTIDE 18 MG/3ML ~~LOC~~ SOPN: 1.2000 mg | PEN_INJECTOR | Freq: Every day | SUBCUTANEOUS | 2 refills | Status: DC

## 2016-06-23 NOTE — Progress Notes (Signed)
Subjective:    CC: DM  HPI: Diabetes - no hypoglycemic events. No wounds or sores that are not healing well. No increased thirst or urination. Checking glucose at home. Taking medications as prescribed without any side effects.He was unable to get the Victoza because the 1.8 mg dose was not approved. She did bring a log of her home blood sugars today. Home blood sugar log looks absolutely fantastic. Please see scanned document.  She still continues to have a rash on her left palm. She's been using the triamcinolone ointment on it and she says it's not bubbling up like it was before but it's not completely gone either and sometimes it feels uncomfortable and hurts. She denies any itching or spreading of the rash.  Hypertension- Pt denies chest pain, SOB, dizziness, or heart palpitations.  Taking meds as directed w/o problems.  Denies medication side effects.      Past medical history, Surgical history, Family history not pertinant except as noted below, Social history, Allergies, and medications have been entered into the medical record, reviewed, and corrections made.   Review of Systems: No fevers, chills, night sweats, weight loss, chest pain, or shortness of breath.   Objective:    General: Well Developed, well nourished, and in no acute distress.  Neuro: Alert and oriented x3, extra-ocular muscles intact, sensation grossly intact.  HEENT: Normocephalic, atraumatic  Skin: Warm and dry, no rashes. Cardiac: Regular rate and rhythm, no murmurs rubs or gallops, no lower extremity edema.  Respiratory: Clear to auscultation bilaterally. Not using accessory muscles, speaking in full sentences.   Impression and Recommendations:   DM - well controlled. Hemoglobin A1c looks fantastic at 6.8. Continue work on [employer] and regular exercise and keep up current regimen. Reminded her to schedule her eye exam. This SmartLink has not been configured with any valid records.    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  HTN - Well controlled. Continue current regimen. Follow up in  3-4 months.

## 2016-06-23 NOTE — Patient Instructions (Signed)
If we can get the Victoza covered and we may be able to drop the Januvia.

## 2016-06-23 NOTE — Addendum Note (Signed)
Addended by: Teddy Spike on: 06/23/2016 11:47 AM   Modules accepted: Orders

## 2016-07-30 ENCOUNTER — Telehealth: Payer: Self-pay

## 2016-07-30 NOTE — Telephone Encounter (Signed)
Left message that papers are ready for pick up.

## 2016-07-30 NOTE — Telephone Encounter (Signed)
Pt stated that she brought court papers and dropped them off Monday for you to fill out.  I do not see them up front, do you still have them?  Please advise.

## 2016-07-30 NOTE — Telephone Encounter (Signed)
Letter is ready for her to pick up at her convenience.

## 2016-09-02 ENCOUNTER — Other Ambulatory Visit (HOSPITAL_BASED_OUTPATIENT_CLINIC_OR_DEPARTMENT_OTHER): Payer: Self-pay | Admitting: Neurosurgery

## 2016-09-02 DIAGNOSIS — M4726 Other spondylosis with radiculopathy, lumbar region: Secondary | ICD-10-CM | POA: Diagnosis not present

## 2016-09-02 DIAGNOSIS — M4316 Spondylolisthesis, lumbar region: Secondary | ICD-10-CM | POA: Diagnosis not present

## 2016-09-02 DIAGNOSIS — Z6841 Body Mass Index (BMI) 40.0 and over, adult: Secondary | ICD-10-CM | POA: Diagnosis not present

## 2016-09-02 DIAGNOSIS — I10 Essential (primary) hypertension: Secondary | ICD-10-CM | POA: Diagnosis not present

## 2016-09-05 ENCOUNTER — Other Ambulatory Visit: Payer: Self-pay | Admitting: Family Medicine

## 2016-09-05 DIAGNOSIS — E119 Type 2 diabetes mellitus without complications: Secondary | ICD-10-CM

## 2016-09-09 ENCOUNTER — Other Ambulatory Visit: Payer: Self-pay | Admitting: Family Medicine

## 2016-09-09 DIAGNOSIS — E119 Type 2 diabetes mellitus without complications: Secondary | ICD-10-CM

## 2016-09-10 ENCOUNTER — Other Ambulatory Visit: Payer: Self-pay | Admitting: Family Medicine

## 2016-09-10 DIAGNOSIS — E119 Type 2 diabetes mellitus without complications: Secondary | ICD-10-CM

## 2016-09-21 ENCOUNTER — Ambulatory Visit (INDEPENDENT_AMBULATORY_CARE_PROVIDER_SITE_OTHER): Payer: Medicare Other | Admitting: Family Medicine

## 2016-09-21 ENCOUNTER — Encounter: Payer: Self-pay | Admitting: Family Medicine

## 2016-09-21 VITALS — BP 114/72 | HR 105 | Ht 64.0 in | Wt 228.0 lb

## 2016-09-21 DIAGNOSIS — M5416 Radiculopathy, lumbar region: Secondary | ICD-10-CM

## 2016-09-21 DIAGNOSIS — E119 Type 2 diabetes mellitus without complications: Secondary | ICD-10-CM

## 2016-09-21 DIAGNOSIS — K8020 Calculus of gallbladder without cholecystitis without obstruction: Secondary | ICD-10-CM

## 2016-09-21 LAB — COMPLETE METABOLIC PANEL WITH GFR
ALT: 21 U/L (ref 6–29)
AST: 19 U/L (ref 10–35)
Albumin: 3.8 g/dL (ref 3.6–5.1)
Alkaline Phosphatase: 102 U/L (ref 33–130)
BUN: 10 mg/dL (ref 7–25)
CO2: 25 mmol/L (ref 20–31)
Calcium: 9 mg/dL (ref 8.6–10.4)
Chloride: 106 mmol/L (ref 98–110)
Creat: 0.83 mg/dL (ref 0.50–0.99)
GFR, Est African American: 87 mL/min (ref >=60)
GFR, Est Non African American: 75 mL/min (ref >=60)
Glucose, Bld: 130 mg/dL — ABNORMAL HIGH (ref 65–99)
Potassium: 4 mmol/L (ref 3.5–5.3)
Sodium: 141 mmol/L (ref 135–146)
Total Bilirubin: 0.3 mg/dL (ref 0.2–1.2)
Total Protein: 7.1 g/dL (ref 6.1–8.1)

## 2016-09-21 LAB — POCT GLYCOSYLATED HEMOGLOBIN (HGB A1C): Hemoglobin A1C: 7.2

## 2016-09-21 MED ORDER — GABAPENTIN 300 MG PO CAPS: ORAL_CAPSULE | ORAL | 3 refills | Status: DC

## 2016-09-21 MED ORDER — GLIPIZIDE 10 MG PO TABS: ORAL_TABLET | ORAL | 1 refills | Status: DC

## 2016-09-21 MED ORDER — DAPAGLIFLOZIN PROPANEDIOL 5 MG PO TABS: 5.0000 mg | ORAL_TABLET | Freq: Every day | ORAL | 5 refills | Status: DC

## 2016-09-21 MED ORDER — PRAVASTATIN SODIUM 40 MG PO TABS: ORAL_TABLET | ORAL | 3 refills | Status: DC

## 2016-09-21 NOTE — Patient Instructions (Addendum)
Stop Januvia.  Will start Farxiga in it's place.   Increase Levemir to 28 units. Call with blood sugar levels after 3 weeks.

## 2016-09-21 NOTE — Progress Notes (Signed)
Subjective:    CC: DM  HPI: Diabetes - no hypoglycemic events. No wounds or sores that are not healing well. No increased thirst or urination. Checking glucose at home.  Brought in home glucose log. Overall fastings look great. That on average she's having 2-3 glucoses in the 140s, fasting in the morning per week. Most of the blood sugars at bedtime are under 200. Taking medications as prescribed without any side effects. She is doing well on the Levemir 26 units daily. This was switched recently because of insurance preferences. Lab Results  Component Value Date   HGBA1C 7.2 09/21/2016   She has not been exercising yet with her back problems. But she did check with her insurance and they will help a for membership to [employer]. She is just waiting on the paperwork to get that approved and is hopefully will start exercising later this month.  She does have a history of gallstones and would like to check on her gallbladder. She like to get scheduled for an ultrasound if possible. She's not had any recent pain or problems.  Chronic low back pain with radiculopathy-she wears her back brace daily. And would like a refill on her gabapentin.  She is not scheduled her mammogram yet.  Past medical history, Surgical history, Family history not pertinant except as noted below, Social history, Allergies, and medications have been entered into the medical record, reviewed, and corrections made.   Review of Systems: No fevers, chills, night sweats, weight loss, chest pain, or shortness of breath.   Objective:    General: Well Developed, well nourished, and in no acute distress.  Neuro: Alert and oriented x3, extra-ocular muscles intact, sensation grossly intact.  HEENT: Normocephalic, atraumatic  Skin: Warm and dry, no rashes. Cardiac: Regular rate and rhythm, no murmurs rubs or gallops, no lower extremity edema.  Respiratory: Clear to auscultation bilaterally. Not using accessory muscles,  speaking in full sentences.   Impression and Recommendations:    DM - Uncontrolled. Hemoglobin A1c went up to 7.2 today. Overall her what fasting blood sugars look well controlled until this past month and for some reason she had multiple mornings where her blood sugars were running in the 130s to 160s range. Not exactly sure why not sure if this is dietary. She says she hasn't made any changes to her regimen. Increase Levemir to 28 units. Go ahead and stop Januvia since she is Artie on a sulfonylurea.  History of gallstones-we'll schedule for ultrasound for further evaluation.  Chronic low back pain with radiculopathy-we'll refill gabapentin today.

## 2016-09-22 NOTE — Progress Notes (Signed)
All labs are normal. 

## 2016-09-25 ENCOUNTER — Ambulatory Visit (HOSPITAL_BASED_OUTPATIENT_CLINIC_OR_DEPARTMENT_OTHER)
Admission: RE | Admit: 2016-09-25 | Discharge: 2016-09-25 | Disposition: A | Discharge status: Home / Self Care | Payer: Medicare HMO | Source: Ambulatory Visit | Attending: Neurosurgery | Admitting: Neurosurgery

## 2016-09-25 ENCOUNTER — Other Ambulatory Visit: Payer: Self-pay | Admitting: Family Medicine

## 2016-09-25 DIAGNOSIS — M4316 Spondylolisthesis, lumbar region: Secondary | ICD-10-CM | POA: Diagnosis not present

## 2016-09-25 DIAGNOSIS — M4326 Fusion of spine, lumbar region: Secondary | ICD-10-CM | POA: Diagnosis not present

## 2016-09-25 DIAGNOSIS — M47816 Spondylosis without myelopathy or radiculopathy, lumbar region: Secondary | ICD-10-CM | POA: Insufficient documentation

## 2016-09-25 DIAGNOSIS — M5126 Other intervertebral disc displacement, lumbar region: Secondary | ICD-10-CM | POA: Diagnosis not present

## 2016-09-25 MED ORDER — GADOBENATE DIMEGLUMINE 529 MG/ML IV SOLN
20.0000 mL | Freq: Once | INTRAVENOUS | Status: AC | PRN
  Administered 2016-09-25: 20 mL via INTRAVENOUS

## 2016-09-27 ENCOUNTER — Ambulatory Visit (INDEPENDENT_AMBULATORY_CARE_PROVIDER_SITE_OTHER): Payer: Medicare HMO | Admitting: Family Medicine

## 2016-09-27 ENCOUNTER — Ambulatory Visit (INDEPENDENT_AMBULATORY_CARE_PROVIDER_SITE_OTHER): Payer: Medicare Other

## 2016-09-27 ENCOUNTER — Encounter: Payer: Self-pay | Admitting: Family Medicine

## 2016-09-27 VITALS — BP 127/77 | HR 107 | Ht 62.21 in | Wt 221.0 lb

## 2016-09-27 DIAGNOSIS — E119 Type 2 diabetes mellitus without complications: Secondary | ICD-10-CM | POA: Diagnosis not present

## 2016-09-27 DIAGNOSIS — R8290 Unspecified abnormal findings in urine: Secondary | ICD-10-CM

## 2016-09-27 DIAGNOSIS — K8020 Calculus of gallbladder without cholecystitis without obstruction: Secondary | ICD-10-CM

## 2016-09-27 DIAGNOSIS — K7689 Other specified diseases of liver: Secondary | ICD-10-CM | POA: Diagnosis not present

## 2016-09-27 DIAGNOSIS — K760 Fatty (change of) liver, not elsewhere classified: Secondary | ICD-10-CM | POA: Diagnosis not present

## 2016-09-27 LAB — POCT URINALYSIS DIPSTICK
Bilirubin, UA: NEGATIVE
Blood, UA: NEGATIVE
Glucose, UA: >=1000
Ketones, UA: NEGATIVE
Leukocytes, UA: NEGATIVE
Nitrite, UA: NEGATIVE
Protein, UA: NEGATIVE
Spec Grav, UA: 1.015 (ref 1.010–1.025)
Urobilinogen, UA: 1 E.U./dL
pH, UA: 7 (ref 5.0–8.0)

## 2016-09-27 LAB — GLUCOSE, POCT (MANUAL RESULT ENTRY): POC Glucose: 173 mg/dl — AB (ref 70–99)

## 2016-09-27 MED ORDER — INSULIN DETEMIR 100 UNIT/ML FLEXPEN: 34.0000 [IU] | PEN_INJECTOR | Freq: Every day | SUBCUTANEOUS | 2 refills | Status: DC

## 2016-09-27 MED ORDER — LIRAGLUTIDE 18 MG/3ML ~~LOC~~ SOPN: 1.2000 mg | PEN_INJECTOR | Freq: Every day | SUBCUTANEOUS | 2 refills | Status: DC

## 2016-09-27 NOTE — Progress Notes (Signed)
   Subjective:    Patient ID: Amanda Morris, female    DOB: December 22, 1950, 66 y.o.   MRN: [id number]  HPI DM- pt reports that since changing her DM meds that her BS have been running higher. she reports that over the w/e her BS were running high and she had a headacheOn Saturday and Sunday, but she feels ok today. she reports that this morning her BS was 130 AC. We had stopped her Januvia recently and started [place]. Increased her Levemir to 28 units.  He suddenly had to switch her Lantus to Levemir about 2-3 weeks ago because of insurance. He says over the weekend her sugar got as high as 288. She said she has not changed her diet at all and has not been drinking soda.  Also on Victoza.  She denies any known infections that she has had an odor to her urine for about 2-3 weeks. Symptoms were present before starting Farxiga.    Review of Systems  No fevers chills or sweats or abdominal pain.     Objective:   Physical Exam  Constitutional: She is oriented to person, place, and time. She appears well-developed and well-nourished.  HENT:  Head: Normocephalic and atraumatic.  Eyes: Conjunctivae and EOM are normal.  Cardiovascular: Normal rate.   Pulmonary/Chest: Effort normal.  Neurological: She is alert and oriented to person, place, and time.  Skin: Skin is dry. No pallor.  Psychiatric: She has a normal mood and affect. Her behavior is normal.  Vitals reviewed.         Assessment & Plan:  DM- Uncontrolled. Discussed options. Will increase her Levemir to 34 units. Explained that sometimes it takes a little bit more Levemir to equal the Lantus they are exactly equivalent. She has also had noted her urine for about 3 weeks so we will also check her for UTI. Explained that infection sometimes can drop of blood sugars as well. She denies any

## 2016-09-27 NOTE — Patient Instructions (Signed)
Call in a couple of days after increasing the Levemir to 34 units to let me know if your blood sugars are improving or not.

## 2016-09-28 DIAGNOSIS — M4726 Other spondylosis with radiculopathy, lumbar region: Secondary | ICD-10-CM | POA: Diagnosis not present

## 2016-10-01 DIAGNOSIS — Z86010 Personal history of colonic polyps: Secondary | ICD-10-CM | POA: Diagnosis not present

## 2016-10-01 DIAGNOSIS — K219 Gastro-esophageal reflux disease without esophagitis: Secondary | ICD-10-CM | POA: Diagnosis not present

## 2016-10-01 DIAGNOSIS — K589 Irritable bowel syndrome without diarrhea: Secondary | ICD-10-CM | POA: Diagnosis not present

## 2016-10-01 DIAGNOSIS — K921 Melena: Secondary | ICD-10-CM | POA: Diagnosis not present

## 2016-10-05 ENCOUNTER — Telehealth: Payer: Self-pay | Admitting: *Deleted

## 2016-10-05 NOTE — Telephone Encounter (Signed)
Patient called stating she is supposed to have a colonoscopy but her GI doctor checked labs on her and said her A1c was elevated. I asked if he did an A1c or glucose tests and she states it was an A1c test but he did not give the number. She states her glucose has been running around 288. She was just seen here on 09/27/16 and was to call and let us know if her sugars improved or not with the changes in medications. The patient states the provider wanted her to schedule an appt with [facility] before she had her colonoscopy. She was transferred to scheduling.

## 2016-10-05 NOTE — Telephone Encounter (Signed)
Ok verify levemir dose. I think she is on 34 units.  If so then increase to 40 units and then call with sugars in one week.

## 2016-10-06 NOTE — Telephone Encounter (Signed)
Patient notified and voiced understanding.

## 2016-10-09 ENCOUNTER — Other Ambulatory Visit: Payer: Self-pay | Admitting: Family Medicine

## 2016-10-12 ENCOUNTER — Telehealth: Payer: Self-pay | Admitting: Family Medicine

## 2016-10-12 ENCOUNTER — Other Ambulatory Visit: Payer: Self-pay

## 2016-10-12 DIAGNOSIS — E119 Type 2 diabetes mellitus without complications: Secondary | ICD-10-CM

## 2016-10-12 MED ORDER — BLOOD GLUCOSE MONITOR KIT: PACK | 99 refills | Status: DC

## 2016-10-12 NOTE — Telephone Encounter (Signed)
Split levemir 20 units in am and 20 units in pm. Continue to check sugars if elevated in the morning increase the evening dose by 2 units every 3-5 days until morning sugars around 90-120. If elevated in evening increase morning dose by 2 units every 3-5 days until evening sugars around 120-150.

## 2016-10-12 NOTE — Telephone Encounter (Signed)
Pt called clinic to give update on what her blood sugars have been running at home. She currently is taking 40 units of levemir at night, farxiga, and glipizide. Her sugars have been running in the range of 146-288. Routing to Provider in office for review.

## 2016-10-12 NOTE — Telephone Encounter (Signed)
Pt advised. She will do this for a couple days and contact clinic with update on sugar levels.

## 2016-10-12 NOTE — Telephone Encounter (Signed)
Keep levemir the same. Increase victoza to 1.8mg  in the am. It is ok to take the 2 together but if we can get sugars down without insulin increase that is preferred.   Is signed for refill today.

## 2016-10-12 NOTE — Telephone Encounter (Signed)
Called to advise Pt, she is taking Victoza (1.2mg ) in the am. Should she start taking the levemir in the am as well? Or increase dose of Victoza? Routing for review.   Pt also requesting a new glucometer, states the pharmacy cant keep the strips in stock. She currently has a accu-chek aviva. Will send new glucometer to [facility].

## 2016-10-15 ENCOUNTER — Telehealth: Payer: Self-pay

## 2016-10-15 DIAGNOSIS — E119 Type 2 diabetes mellitus without complications: Secondary | ICD-10-CM

## 2016-10-15 DIAGNOSIS — Z1231 Encounter for screening mammogram for malignant neoplasm of breast: Secondary | ICD-10-CM | POA: Diagnosis not present

## 2016-10-15 LAB — HM MAMMOGRAPHY

## 2016-10-15 MED ORDER — BLOOD GLUCOSE MONITOR KIT: PACK | 99 refills | Status: DC

## 2016-10-15 NOTE — Telephone Encounter (Signed)
Glucose meter and supplies.

## 2016-10-22 ENCOUNTER — Telehealth: Payer: Self-pay

## 2016-10-22 NOTE — Telephone Encounter (Signed)
I do think over all the numbers are better even though there are still some high numbers. None of them are low. Increase Levemir to 45 units for 1 week and then call back with numbers at that point.

## 2016-10-22 NOTE — Telephone Encounter (Signed)
Patient advised of recommendations.  

## 2016-10-22 NOTE — Telephone Encounter (Signed)
Amanda Morris called and states she did increase the Levemir to 40 units nightly. Her blood sugar readings have been up and down. She is agreeable to having some DM nutrition education. Please advise. She states she is taking all other DM medications as directed.   [phone]  [phone]

## 2016-10-22 NOTE — Telephone Encounter (Signed)
Left detailed message on patient vm with advise as noted below. Matheus Spiker,CMA  

## 2016-10-28 ENCOUNTER — Telehealth: Payer: Self-pay | Admitting: *Deleted

## 2016-10-28 NOTE — Telephone Encounter (Signed)
I agree. See if she would be willing to come in and just do a wet prep with the nurse to see if she could have a yeast infection.

## 2016-10-28 NOTE — Telephone Encounter (Signed)
Pt notified and schedules -EH/RMA

## 2016-10-28 NOTE — Telephone Encounter (Signed)
Patient called and states sinceshe has been taking levemir at the increased dose of 45 units she has had nausea, her skin is itching and she has a vaginal discharge. Her sugar was 130 this am and she states she has been running around 130 since she has started taking the increased dose of Levemir. I did tell her that she may have a yeast infection since she is having vaginal discharge and her sugars may be uncontrolled and she may need to come in to do a wet prep.   Please advise

## 2016-10-29 ENCOUNTER — Ambulatory Visit (INDEPENDENT_AMBULATORY_CARE_PROVIDER_SITE_OTHER): Payer: Medicare Other | Admitting: Family Medicine

## 2016-10-29 ENCOUNTER — Other Ambulatory Visit: Payer: Self-pay | Admitting: Family Medicine

## 2016-10-29 VITALS — BP 118/78 | HR 111 | Temp 98.3°F | Wt 223.0 lb

## 2016-10-29 DIAGNOSIS — Z86010 Personal history of colonic polyps: Secondary | ICD-10-CM | POA: Diagnosis not present

## 2016-10-29 DIAGNOSIS — T50905A Adverse effect of unspecified drugs, medicaments and biological substances, initial encounter: Secondary | ICD-10-CM

## 2016-10-29 DIAGNOSIS — L298 Other pruritus: Secondary | ICD-10-CM

## 2016-10-29 DIAGNOSIS — N760 Acute vaginitis: Secondary | ICD-10-CM | POA: Diagnosis not present

## 2016-10-29 DIAGNOSIS — T887XXA Unspecified adverse effect of drug or medicament, initial encounter: Secondary | ICD-10-CM

## 2016-10-29 DIAGNOSIS — K625 Hemorrhage of anus and rectum: Secondary | ICD-10-CM | POA: Diagnosis not present

## 2016-10-29 DIAGNOSIS — K641 Second degree hemorrhoids: Secondary | ICD-10-CM | POA: Diagnosis not present

## 2016-10-29 DIAGNOSIS — R21 Rash and other nonspecific skin eruption: Secondary | ICD-10-CM | POA: Diagnosis not present

## 2016-10-29 DIAGNOSIS — K6389 Other specified diseases of intestine: Secondary | ICD-10-CM | POA: Diagnosis not present

## 2016-10-29 DIAGNOSIS — D125 Benign neoplasm of sigmoid colon: Secondary | ICD-10-CM | POA: Diagnosis not present

## 2016-10-29 LAB — WET PREP, GENITAL: Trich, Wet Prep: NONE SEEN

## 2016-10-29 LAB — HM COLONOSCOPY

## 2016-10-29 MED ORDER — INSULIN GLARGINE 100 UNIT/ML SOLOSTAR PEN: 43.0000 [IU] | PEN_INJECTOR | Freq: Every day | SUBCUTANEOUS | 99 refills | Status: DC

## 2016-10-29 MED ORDER — FLUCONAZOLE 150 MG PO TABS: 150.0000 mg | ORAL_TABLET | Freq: Once | ORAL | 0 refills | Status: AC

## 2016-10-29 MED ORDER — PROMETHAZINE HCL 25 MG PO TABS: 25.0000 mg | ORAL_TABLET | Freq: Three times a day (TID) | ORAL | 0 refills | Status: DC | PRN

## 2016-10-29 MED ORDER — METRONIDAZOLE 500 MG PO TABS: 500.0000 mg | ORAL_TABLET | Freq: Two times a day (BID) | ORAL | 0 refills | Status: DC

## 2016-10-29 NOTE — Addendum Note (Signed)
Addended by: Beatrice Lecher D on: 10/29/2016 02:56 PM   Modules accepted: Orders

## 2016-10-29 NOTE — Progress Notes (Signed)
Pt reports that last week she began to experience vaginal itching and discharge. She has used vagisil this has not helped. She stated  That she has noticed that since the increase of her insulin to 45 units she has been breaking out in hives on her neck arms and back, having some nausea,abdominal cramping. I asked her if she has been taking anything she stated that she did not want to take anything that may interfere with her current medications that she is taking. Maryruth Eve, [place]

## 2016-10-29 NOTE — Progress Notes (Signed)
walked-in and discussed findings with patient. Okay to use Benadryl if needed. Discontinue Levemir. I'll send over new prescription for Lantus. If rash keeps recurring them please let me know as it may or may not be related to the Levemir. She does have some driver skin on her forearms but I do not see any true hives. She does have a slightly erythematous papule near the left corner of the eye.  Vaginitis-we'll send for wet prep and will call with results once available and treat appropriately.  Beatrice Lecher, MD

## 2016-11-04 ENCOUNTER — Encounter: Payer: Self-pay | Admitting: Family Medicine

## 2016-11-22 DIAGNOSIS — M5126 Other intervertebral disc displacement, lumbar region: Secondary | ICD-10-CM | POA: Insufficient documentation

## 2016-11-22 DIAGNOSIS — R030 Elevated blood-pressure reading, without diagnosis of hypertension: Secondary | ICD-10-CM | POA: Diagnosis not present

## 2016-11-22 DIAGNOSIS — M961 Postlaminectomy syndrome, not elsewhere classified: Secondary | ICD-10-CM | POA: Diagnosis not present

## 2016-11-22 DIAGNOSIS — Z6841 Body Mass Index (BMI) 40.0 and over, adult: Secondary | ICD-10-CM | POA: Diagnosis not present

## 2016-11-22 DIAGNOSIS — M4726 Other spondylosis with radiculopathy, lumbar region: Secondary | ICD-10-CM | POA: Diagnosis not present

## 2016-12-01 ENCOUNTER — Telehealth: Payer: Self-pay | Admitting: Family Medicine

## 2016-12-01 MED ORDER — EMPAGLIFLOZIN 25 MG PO TABS: 25.0000 mg | ORAL_TABLET | Freq: Every day | ORAL | 5 refills | Status: DC

## 2016-12-01 NOTE — Telephone Encounter (Signed)
Called patient gave her information as noted below. Patient stated that she will try the Jardiance. Please advise. Rhonda Cunningham,CMA

## 2016-12-01 NOTE — Telephone Encounter (Signed)
rx sent

## 2016-12-01 NOTE — Telephone Encounter (Signed)
Call patient: Got a letter from her insurance. They will not cover the [place]. They will cover and the, or Jardiance. If she is okay with switching to one of these to them please let me know. Certainly use up the [place] that she currently has first.

## 2016-12-22 ENCOUNTER — Ambulatory Visit (INDEPENDENT_AMBULATORY_CARE_PROVIDER_SITE_OTHER): Payer: Medicare Other | Admitting: Family Medicine

## 2016-12-22 ENCOUNTER — Encounter: Payer: Self-pay | Admitting: Family Medicine

## 2016-12-22 VITALS — BP 123/79 | HR 89 | Wt 230.0 lb

## 2016-12-22 DIAGNOSIS — Z6841 Body Mass Index (BMI) 40.0 and over, adult: Secondary | ICD-10-CM | POA: Diagnosis not present

## 2016-12-22 DIAGNOSIS — R1030 Lower abdominal pain, unspecified: Secondary | ICD-10-CM

## 2016-12-22 DIAGNOSIS — E119 Type 2 diabetes mellitus without complications: Secondary | ICD-10-CM | POA: Diagnosis not present

## 2016-12-22 DIAGNOSIS — R350 Frequency of micturition: Secondary | ICD-10-CM

## 2016-12-22 DIAGNOSIS — Z23 Encounter for immunization: Secondary | ICD-10-CM

## 2016-12-22 LAB — POCT URINALYSIS DIPSTICK
Bilirubin, UA: NEGATIVE
Blood, UA: NEGATIVE
Ketones, UA: NEGATIVE
Leukocytes, UA: NEGATIVE
Nitrite, UA: NEGATIVE
Protein, UA: NEGATIVE
Spec Grav, UA: 1.015 (ref 1.010–1.025)
Urobilinogen, UA: 0.2 E.U./dL
pH, UA: 7.5 (ref 5.0–8.0)

## 2016-12-22 LAB — POCT GLYCOSYLATED HEMOGLOBIN (HGB A1C): Hemoglobin A1C: 7.3

## 2016-12-22 MED ORDER — INSULIN GLARGINE 100 UNIT/ML SOLOSTAR PEN: 36.0000 [IU] | PEN_INJECTOR | Freq: Every day | SUBCUTANEOUS | 99 refills | Status: DC

## 2016-12-22 NOTE — Patient Instructions (Addendum)
Please schedule your eye exam. Decrease the Victoza down to 1.2 mg daily and call me in 2 weeks if you are not feeling any better in regards to vomiting. Please schedule your Pap smear next month.

## 2016-12-22 NOTE — Progress Notes (Signed)
Subjective:    CC: DM  HPI: Diabetes - no hypoglycemic events. No wounds or sores that are not healing well. No increased thirst or urination. Checking glucose at home. Taking medications as prescribed without any side effects. On 34 units of Lantus. She just joined [employer] and plans to start exercising more regularly.  She also complains of lower abdominal pain. She's this is been going on for a couple of months at this point. She'll start to get a crampy sensation across the right and left lower abdomen. And then she willvomit liquid. She says it's never actually food. She says it happens almost every day. She has not noticed any blood in her stool or vomitr urine. No dysuria but she does have some urinary frequency. She does have some occasional diarrhea. No fevers chills or sweats.   BMI 40-patient admits that she has gained weight and hasn't been doing the best with her diet. She plans on getting back on track and says she actually joined [employer] to start exercising again.   Past medical history, Surgical history, Family history not pertinant except as noted below, Social history, Allergies, and medications have been entered into the medical record, reviewed, and corrections made.   Review of Systems: No fevers, chills, night sweats, weight loss, chest pain, or shortness of breath.   Objective:    General: Well Developed, well nourished, and in no acute distress.  Neuro: Alert and oriented x3, extra-ocular muscles intact, sensation grossly intact.  HEENT: Normocephalic, atraumatic  Skin: Warm and dry, no rashes. Cardiac: Regular rate and rhythm, no murmurs rubs or gallops, no lower extremity edema.  Respiratory: Clear to auscultation bilaterally. Not using accessory muscles, speaking in full sentences.   Impression and Recommendations:    DM- hemoglobin A1c of 7.3 today. All of her medications were changed recently because of formulary changes.She just ran out of the  [place] and will be starting the [place]. Encouraged her to take that in the mornings. She is on 34 units of Lantus. follow-up in 3 months.  Lower abdominal pain-unclear etiology. It's been going on persistently for a couple of months unlikely to be infectious or I think she would've gotten acutely worse at this point in time. They will check a CBC today. We'll also check her thyroid. Consider that it could be related to her medication. She's also been vomiting/spitting up. It's never food content. Consider that it could be coming from the Victoza so we'll try decreasing her dose back down to 1.2 mg for the next couple of weeks.  She will call me in 2 weeks and let me know how she's doing.  BMI 40 - We discussed some strategies as far as eating a low carb diet overall and really avoiding sweetened beverages.Increase protein for breakfast.  She plans on starting to exercise again. Will address again at follow-up in 3 months.

## 2017-01-01 DIAGNOSIS — Z794 Long term (current) use of insulin: Secondary | ICD-10-CM | POA: Diagnosis not present

## 2017-01-01 DIAGNOSIS — R2231 Localized swelling, mass and lump, right upper limb: Secondary | ICD-10-CM | POA: Diagnosis not present

## 2017-01-01 DIAGNOSIS — L03011 Cellulitis of right finger: Secondary | ICD-10-CM | POA: Diagnosis not present

## 2017-01-01 DIAGNOSIS — Z880 Allergy status to penicillin: Secondary | ICD-10-CM | POA: Diagnosis not present

## 2017-01-01 DIAGNOSIS — Z79899 Other long term (current) drug therapy: Secondary | ICD-10-CM | POA: Diagnosis not present

## 2017-01-01 DIAGNOSIS — I10 Essential (primary) hypertension: Secondary | ICD-10-CM | POA: Diagnosis not present

## 2017-01-01 DIAGNOSIS — M19041 Primary osteoarthritis, right hand: Secondary | ICD-10-CM | POA: Diagnosis not present

## 2017-01-05 ENCOUNTER — Telehealth: Payer: Self-pay | Admitting: Family Medicine

## 2017-01-05 NOTE — Telephone Encounter (Signed)
Pt states she was advised by PCP to call in 2 weeks for an update. Pt reports still having abdominal pain, and some nausea/vomiting. Offered to move the follow up appt sooner, Pt declined. Would like PCP to be advised of current status and see if she would like to get any further testing.

## 2017-01-06 DIAGNOSIS — R109 Unspecified abdominal pain: Secondary | ICD-10-CM | POA: Diagnosis not present

## 2017-01-06 DIAGNOSIS — K921 Melena: Secondary | ICD-10-CM | POA: Diagnosis not present

## 2017-01-06 DIAGNOSIS — K219 Gastro-esophageal reflux disease without esophagitis: Secondary | ICD-10-CM | POA: Diagnosis not present

## 2017-01-06 DIAGNOSIS — Z86010 Personal history of colonic polyps: Secondary | ICD-10-CM | POA: Diagnosis not present

## 2017-01-06 NOTE — Telephone Encounter (Signed)
I think we should refer her to GI for now. See is she is OK with that.

## 2017-01-06 NOTE — Telephone Encounter (Signed)
Attempted to contact Pt, no answer and no VM set up.  

## 2017-01-10 DIAGNOSIS — K921 Melena: Secondary | ICD-10-CM | POA: Diagnosis not present

## 2017-01-10 DIAGNOSIS — R1111 Vomiting without nausea: Secondary | ICD-10-CM | POA: Diagnosis not present

## 2017-01-10 DIAGNOSIS — R12 Heartburn: Secondary | ICD-10-CM | POA: Diagnosis not present

## 2017-01-10 DIAGNOSIS — K449 Diaphragmatic hernia without obstruction or gangrene: Secondary | ICD-10-CM | POA: Diagnosis not present

## 2017-01-10 DIAGNOSIS — R109 Unspecified abdominal pain: Secondary | ICD-10-CM | POA: Diagnosis not present

## 2017-01-10 DIAGNOSIS — K219 Gastro-esophageal reflux disease without esophagitis: Secondary | ICD-10-CM | POA: Diagnosis not present

## 2017-01-10 DIAGNOSIS — Z86010 Personal history of colonic polyps: Secondary | ICD-10-CM | POA: Diagnosis not present

## 2017-01-10 NOTE — Telephone Encounter (Signed)
lvm asking her to rtn call.Amanda Morris [place]

## 2017-01-10 NOTE — Telephone Encounter (Signed)
Let's try to contact her again.

## 2017-01-11 DIAGNOSIS — K222 Esophageal obstruction: Secondary | ICD-10-CM | POA: Diagnosis not present

## 2017-01-11 DIAGNOSIS — K219 Gastro-esophageal reflux disease without esophagitis: Secondary | ICD-10-CM | POA: Diagnosis not present

## 2017-01-11 DIAGNOSIS — K449 Diaphragmatic hernia without obstruction or gangrene: Secondary | ICD-10-CM | POA: Diagnosis not present

## 2017-01-11 DIAGNOSIS — R1310 Dysphagia, unspecified: Secondary | ICD-10-CM | POA: Diagnosis not present

## 2017-01-12 NOTE — Telephone Encounter (Signed)
Spoke w/pt's niece she had procedure done yesterday. She stated that she is doing fine. She is not having any pain and is eating well. Advised her that if there is anything that we can do to please call.Amanda Morris [place]

## 2017-01-18 ENCOUNTER — Ambulatory Visit (INDEPENDENT_AMBULATORY_CARE_PROVIDER_SITE_OTHER): Payer: Medicare Other | Admitting: Family Medicine

## 2017-01-18 ENCOUNTER — Encounter: Payer: Self-pay | Admitting: Family Medicine

## 2017-01-18 VITALS — BP 128/68 | HR 67 | Ht 62.0 in | Wt 225.0 lb

## 2017-01-18 DIAGNOSIS — Z0000 Encounter for general adult medical examination without abnormal findings: Secondary | ICD-10-CM | POA: Diagnosis not present

## 2017-01-18 DIAGNOSIS — I10 Essential (primary) hypertension: Secondary | ICD-10-CM

## 2017-01-18 LAB — COMPLETE METABOLIC PANEL WITH GFR
AG Ratio: 1.3 (calc) (ref 1.0–2.5)
ALT: 17 U/L (ref 6–29)
AST: 22 U/L (ref 10–35)
Albumin: 3.9 g/dL (ref 3.6–5.1)
Alkaline phosphatase (APISO): 82 U/L (ref 33–130)
BUN: 14 mg/dL (ref 7–25)
CO2: 25 mmol/L (ref 20–32)
Calcium: 8.7 mg/dL (ref 8.6–10.4)
Chloride: 108 mmol/L (ref 98–110)
Creat: 0.79 mg/dL (ref 0.50–0.99)
GFR, Est African American: 92 mL/min/{1.73_m2} (ref >=60)
GFR, Est Non African American: 79 mL/min/{1.73_m2} (ref >=60)
Globulin: 2.9 g/dL (calc) (ref 1.9–3.7)
Glucose, Bld: 60 mg/dL — ABNORMAL LOW (ref 65–99)
Potassium: 4.1 mmol/L (ref 3.5–5.3)
Sodium: 142 mmol/L (ref 135–146)
Total Bilirubin: 0.4 mg/dL (ref 0.2–1.2)
Total Protein: 6.8 g/dL (ref 6.1–8.1)

## 2017-01-18 LAB — LIPID PANEL W/REFLEX DIRECT LDL
Cholesterol: 130 mg/dL (ref <200)
HDL: 36 mg/dL — ABNORMAL LOW (ref >50)
LDL Cholesterol (Calc): 72 mg/dL (calc)
Non-HDL Cholesterol (Calc): 94 mg/dL (calc) (ref <130)
Total CHOL/HDL Ratio: 3.6 (calc) (ref <5.0)
Triglycerides: 137 mg/dL (ref <150)

## 2017-01-18 NOTE — Patient Instructions (Signed)
He can review the information for hepatitis B vaccine inside if you would like to get it. Next year he'll be due for tetanus update as well as Prevnar 13. You will also be due for bone density next year. Hold the Victoza for 1 week and call me next week and let me know if you feel like her nausea and diarrhea is improving.   Please schedule your eye exam.

## 2017-01-18 NOTE — Progress Notes (Signed)
Subjective:   Amanda Morris is a 66 y.o. female who presents for Medicare Annual (Subsequent) preventive examination.  Review of Systems:  Comprehensive ROS is negative.         Objective:     Vitals: BP 128/68   Pulse 67   Ht _0  (1.575 m)   Wt 225 lb (102.1 kg)   SpO2 100%   BMI 41.15 kg/m   Body mass index is 41.15 kg/m.   Tobacco History  Smoking Status  . Former Smoker  . Types: Cigarettes  . Quit date: 07/30/2007  Smokeless Tobacco  . Never Used     Counseling given: Not Answered   Past Medical History:  Diagnosis Date  . Allergy   . Diabetes mellitus without complication ([place])   . GERD (gastroesophageal reflux disease)   . Headache    migraines last one 2 weeks ago  . History of hiatal hernia   . Palpitations    in the past, no current issues per patient   Past Surgical History:  Procedure Laterality Date  . ABDOMINAL HYSTERECTOMY  1982  . BACK SURGERY    . BREAST REDUCTION SURGERY Bilateral 03/17/2016   Procedure: MAMMARY REDUCTION  (BREAST);  Surgeon: Wallace Going, DO;  Location: [facility];  Service: Plastics;  Laterality: Bilateral;  . SHOULDER SURGERY  06/2008, 09/2010   Right, by Dr. Karie Soda, then Dr. Judeth Horn   Family History  Problem Relation Age of Onset  . Heart disease Mother   . Diabetes Mother   . Hypertension Mother   . Stroke Mother   . Heart disease Father   . Heart disease Sister   . Diabetes Sister   . Hyperlipidemia Sister   . Hypertension Sister   . Stroke Sister   . Alcohol abuse Brother   . Hyperlipidemia Brother    History  Sexual Activity  . Sexual activity: No    Outpatient Encounter Prescriptions as of 01/18/2017  Medication Sig  . aspirin EC 81 MG tablet Take 81 mg by mouth daily.  . B-D ULTRAFINE III SHORT PEN 31G X 8 MM MISC USE AS DIRECTED  . blood glucose meter kit and supplies KIT Dispense glucometer, test strips, and lancets. Use to check blood sugar twice daily. Dx: E11.9   . Cholecalciferol (D3-1000 PO) Take 1 capsule by mouth daily.  Marland Kitchen DEXILANT 60 MG capsule Take 1 capsule by mouth daily.  Marland Kitchen dicyclomine (BENTYL) 10 MG capsule Take 10 mg by mouth 4 (four) times daily.  . empagliflozin (JARDIANCE) 25 MG TABS tablet Take 25 mg by mouth daily.  . famotidine (PEPCID) 40 MG tablet Take 40 mg by mouth every evening.  . gabapentin (NEURONTIN) 300 MG capsule 1 THREE TIMES DAILY. MAX OF 3600MG/DAY  . glipiZIDE (GLUCOTROL) 10 MG tablet TAKE 1 TABLET (10 MG TOTAL) BY MOUTH 2 (TWO) TIMES DAILY BEFORE A MEAL.  Marland Kitchen Insulin Glargine (LANTUS SOLOSTAR) 100 UNIT/ML Solostar Pen Inject 36 Units into the skin daily at 10 pm.  . liraglutide (VICTOZA) 18 MG/3ML SOPN Inject 0.2 mLs (1.2 mg total) into the skin daily.  . Multiple Vitamins-Minerals (MULTIVITAMIN WOMEN 50+ PO) Take 1 capsule by mouth daily.  . naproxen sodium (ALEVE) 220 MG tablet Take 220-440 mg by mouth 2 (two) times daily as needed.  . pravastatin (PRAVACHOL) 40 MG tablet TAKE 1 TABLET (40 MG TOTAL) BY MOUTH QHS.  . [DISCONTINUED] perphenazine (TRILAFON) 2 MG tablet TAKE 1 TABLET BY MOUTH THREE TIMES A DAY WITH  AMITRIP.  . [DISCONTINUED] promethazine (PHENERGAN) 25 MG tablet Take 1 tablet (25 mg total) by mouth every 8 (eight) hours as needed for nausea or vomiting.   No facility-administered encounter medications on file as of 01/18/2017.     Activities of Daily Living In your present state of health, do you have any difficulty performing the following activities: 03/17/2016  Hearing? N  Vision? N  Difficulty concentrating or making decisions? N  Walking or climbing stairs? N  Dressing or bathing? N  Some recent data might be hidden    Patient Care Team: Hali Marry, MD as PCP - General (Family Medicine) Eulis Canner (Gastroenterology)    Assessment:    Exercise Activities and Dietary recommendations    Goals    None     Fall Risk Fall Risk  01/18/2017 09/21/2016  Falls in the past year?  Yes Yes  Number falls in past yr: 1 1  Injury with Fall? No No  Risk for fall due to : Impaired balance/gait Other (Comment)  Risk for fall due to: Comment - climbing up stairs  Follow up Falls prevention discussed Falls prevention discussed   Depression Screen PHQ 2/9 Scores 01/18/2017 12/22/2016 09/21/2016  PHQ - 2 Score 0 0 0     Cognitive Function     6CIT Screen 01/18/2017  What Year? 0 points  What month? 0 points  What time? 0 points  Count back from 20 2 points  Months in reverse 0 points  Repeat phrase 6 points  Total Score 8    Immunization History  Administered Date(s) Administered  . Influenza Split 03/02/2011, 02/25/2012  . Influenza,inj,Quad PF,6+ Mos 02/13/2015, 12/23/2015, 12/22/2016  . Influenza-Unspecified 01/24/2013, 01/21/2014, 02/26/2015  . Pneumococcal Conjugate-13 02/14/2015  . Pneumococcal Polysaccharide-23 05/12/2005  . Tdap 04/27/2007   Screening Tests Health Maintenance  Topic Date Due  . OPHTHALMOLOGY EXAM  05/14/2016  . FOOT EXAM  02/02/2017  . URINE MICROALBUMIN  02/02/2017  . TETANUS/TDAP  04/26/2017  . HEMOGLOBIN A1C  06/23/2017  . MAMMOGRAM  10/16/2018  . COLONOSCOPY  10/29/2021  . INFLUENZA VACCINE  Completed  . Hepatitis C Screening  Completed  . HIV Screening  Completed      Plan:      I have personally reviewed and noted the following in the patient's chart:   . Medical and social history . Use of alcohol, tobacco or illicit drugs  . Current medications and supplements . Functional ability and status . Nutritional status . Physical activity . Advanced directives . List of other physicians . Hospitalizations, surgeries, and ER visits in previous 12 months . Vitals . Screenings to include cognitive, depression, and falls . Referrals and appointments  In addition, I have reviewed and discussed with patient certain preventive protocols, quality metrics, and best practice recommendations. A written personalized care plan  for preventive services as well as general preventive health recommendations were provided to patient.     Beatrice Lecher, MD  01/18/2017

## 2017-01-18 NOTE — Addendum Note (Signed)
Addended by: Beatrice Lecher D on: 01/18/2017 09:29 AM   Modules accepted: Orders

## 2017-01-19 NOTE — Progress Notes (Signed)
All labs are normal. 

## 2017-01-25 ENCOUNTER — Telehealth: Payer: Self-pay | Admitting: Family Medicine

## 2017-01-25 NOTE — Telephone Encounter (Signed)
Pt advised. She will restart the Victoza. Pt has a GI in [place], she will call them for an appointment.

## 2017-01-25 NOTE — Telephone Encounter (Signed)
Pt was advised to contact clinic in one week to give an update after holding the Victoza. Pt reports still having nausea and diarrhea, as well as abdominal pain. Pain is localized on the "bottom" of her stomach, rates her pain at 10/10. Pt reports the pain comes and goes, and mimics contractions like "having a baby."  Pt reports no active vomiting. She has been belching, which has caused her to vomit in the last couple days. Routing to PCP for review and recommendations.

## 2017-01-25 NOTE — Telephone Encounter (Signed)
Okay, I believe we have referred her to GI. Please see if she has an appointment scheduled. Okay to restart the Victoza. I just wanted to make sure it wasn't the Victoza causing some of the nausea and abdominal issues.

## 2017-02-10 ENCOUNTER — Ambulatory Visit: Payer: Medicare Other

## 2017-02-12 ENCOUNTER — Other Ambulatory Visit: Payer: Self-pay | Admitting: Family Medicine

## 2017-02-16 ENCOUNTER — Ambulatory Visit (INDEPENDENT_AMBULATORY_CARE_PROVIDER_SITE_OTHER): Payer: Medicare Other | Admitting: Family Medicine

## 2017-02-16 VITALS — BP 123/68 | HR 76 | Wt 225.0 lb

## 2017-02-16 DIAGNOSIS — Z111 Encounter for screening for respiratory tuberculosis: Secondary | ICD-10-CM

## 2017-02-16 NOTE — Progress Notes (Signed)
Agree with above.  Tinna Kolker, MD  

## 2017-02-16 NOTE — Progress Notes (Signed)
Patient is here for a TB skin test placement for her job. Patient has no history of having a reaction to TB skin test. PPD placed today without complication. Patient will return in 48 hours. She already has nurse visit appointment scheduled.

## 2017-02-18 ENCOUNTER — Ambulatory Visit (INDEPENDENT_AMBULATORY_CARE_PROVIDER_SITE_OTHER): Payer: Medicare Other | Admitting: Family Medicine

## 2017-02-18 VITALS — BP 146/82 | HR 78

## 2017-02-18 DIAGNOSIS — Z111 Encounter for screening for respiratory tuberculosis: Secondary | ICD-10-CM

## 2017-02-18 LAB — TB SKIN TEST
Induration: 0 mm
TB Skin Test: NEGATIVE

## 2017-02-18 NOTE — Progress Notes (Signed)
Agree with above.  Catherine Metheney, MD  

## 2017-02-18 NOTE — Progress Notes (Signed)
Pt came into clinic for PPD read. 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 PPD test was negative. Printed results given. Reminded Pt she is overdue for an eye exam. No further questions.

## 2017-02-24 ENCOUNTER — Telehealth: Payer: Self-pay

## 2017-02-24 NOTE — Telephone Encounter (Signed)
Patient calling in to advise provider that she did not restart Victorza after stopping it 3-4 weeks ago due to N/V and abdominal pain. She states she has been taking Glipizide (Glucotrol) 10 mg three times a day instead of twice a day.  Advised patient take the Glipizide as directed and I would send provider a message for further instructions. Patient stated she understood and would.

## 2017-02-24 NOTE — Telephone Encounter (Signed)
Called patient - left message with her daughter to call office with blood sugars readings for the 3-4 weeks.

## 2017-02-24 NOTE — Telephone Encounter (Signed)
Please add Victoza to her intolerance list and please see what her blood sugars have been running. I may be able to just adjust the insulin for now.  I agree only take the glipizide BID

## 2017-02-27 ENCOUNTER — Other Ambulatory Visit: Payer: Self-pay | Admitting: Family Medicine

## 2017-03-02 ENCOUNTER — Other Ambulatory Visit: Payer: Self-pay | Admitting: Family Medicine

## 2017-03-07 ENCOUNTER — Telehealth: Payer: Self-pay | Admitting: Family Medicine

## 2017-03-07 NOTE — Telephone Encounter (Signed)
Pt called. Allergy meds not working & would like another med called in.  Thank you.

## 2017-03-08 MED ORDER — FLUTICASONE FUROATE 27.5 MCG/SPRAY NA SUSP: 2.0000 | Freq: Every day | NASAL | 12 refills | Status: DC

## 2017-03-08 MED ORDER — LEVOCETIRIZINE DIHYDROCHLORIDE 5 MG PO TABS: 5.0000 mg | ORAL_TABLET | Freq: Every evening | ORAL | 5 refills | Status: DC

## 2017-03-08 NOTE — Telephone Encounter (Signed)
Thank you :)

## 2017-03-08 NOTE — Telephone Encounter (Signed)
Pt advised,verbalized understanding. 

## 2017-03-08 NOTE — Telephone Encounter (Signed)
Scription sent for size I will which is an oral antihistamine and a new steroid spray for the nose called Veramyst.

## 2017-03-16 ENCOUNTER — Encounter: Payer: Self-pay | Admitting: Family Medicine

## 2017-03-16 ENCOUNTER — Ambulatory Visit (INDEPENDENT_AMBULATORY_CARE_PROVIDER_SITE_OTHER): Payer: Medicare Other | Admitting: Family Medicine

## 2017-03-16 VITALS — BP 136/66 | HR 75 | Temp 98.8°F | Ht 62.0 in | Wt 219.0 lb

## 2017-03-16 DIAGNOSIS — J0110 Acute frontal sinusitis, unspecified: Secondary | ICD-10-CM | POA: Diagnosis not present

## 2017-03-16 MED ORDER — AZITHROMYCIN 250 MG PO TABS: ORAL_TABLET | ORAL | 0 refills | Status: DC

## 2017-03-16 NOTE — Progress Notes (Signed)
   Subjective:    Patient ID: Amare Kontos, female    DOB: [birth year], 66 y.o.   MRN: [id number]  HPI 66 yo female comes in today c/o of 2 weeks of nasal congestion and some blood mixed.  No fever, sweats or chills.  Tried some OTC medications.  She called in a prescription of Xyzal about 10 days ago which she has been taking as well as Veramyst but it was not covered by her insurance. She has had thick nasal discharge and productive cough in her throat.  No CP or SOB.  She has had a bad frontal HA and pressure under both eyes. Now her ears and throat are hurting.     Review of Systems     Objective:   Physical Exam  Constitutional: She is oriented to person, place, and time. She appears well-developed and well-nourished.  HENT:  Head: Normocephalic and atraumatic.  Right Ear: External ear normal.  Left Ear: External ear normal.  Nose: Nose normal.  Mouth/Throat: Oropharynx is clear and moist.  TMs and canals are clear.   Eyes: Conjunctivae and EOM are normal. Pupils are equal, round, and reactive to light.  Neck: Neck supple. No thyromegaly present.  Cardiovascular: Normal rate, regular rhythm and normal heart sounds.  Pulmonary/Chest: Effort normal and breath sounds normal. She has no wheezes.  Lymphadenopathy:    She has no cervical adenopathy.  Neurological: She is alert and oriented to person, place, and time.  Skin: Skin is warm and dry.  Psychiatric: She has a normal mood and affect.       Assessment & Plan:  Acute sinusitis-we will treat with azithromycin.  Recommend nasal saline sinus rinse as well as increasing the humidity in the home.  Call if not significantly better by Monday.

## 2017-03-16 NOTE — Patient Instructions (Addendum)
Sinus Rinse What is a sinus rinse? A sinus rinse is a simple home treatment that is used to rinse your sinuses with a sterile mixture of salt and water (saline solution). Sinuses are air-filled spaces in your skull behind the bones of your face and forehead that open into your nasal cavity. You will use the following:  Saline solution.  Neti pot or spray bottle. This releases the saline solution into your nose and through your sinuses. Neti pots and spray bottles can be purchased at your local pharmacy, a health food store, or online.  When would I do a sinus rinse? A sinus rinse can help to clear mucus, dirt, dust, or pollen from the nasal cavity. You may do a sinus rinse when you have a cold, a virus, nasal allergy symptoms, a sinus infection, or stuffiness in the nose or sinuses. If you are considering a sinus rinse:  Ask your child's health care provider before performing a sinus rinse on your child.  Do not do a sinus rinse if you have had ear or nasal surgery, ear infection, or blocked ears.  How do I do a sinus rinse?  Wash your hands.  Disinfect your device according to the directions provided and then dry it.  Use the solution that comes with your device or one that is sold separately in stores. Follow the mixing directions on the package.  Fill your device with the amount of saline solution as directed by the device instructions.  Stand over a sink and tilt your head sideways over the sink.  Place the spout of the device in your upper nostril (the one closer to the ceiling).  Gently pour or squeeze the saline solution into the nasal cavity. The liquid should drain to the lower nostril if you are not overly congested.  Gently blow your nose. Blowing too hard may cause ear pain.  Repeat in the other nostril.  Clean and rinse your device with clean water and then air-dry it. Are there risks of a sinus rinse? Sinus rinse is generally very safe and effective. However,  there are a few risks, which include:  A burning sensation in the sinuses. This may happen if you do not make the saline solution as directed. Make sure to follow all directions when making the saline solution.  Infection from contaminated water. This is rare, but possible.  Nasal irritation.  This information is not intended to replace advice given to you by your health care provider. Make sure you discuss any questions you have with your health care provider. Document Released: 11/07/2013 Document Revised: 03/09/2016 Document Reviewed: 08/28/2013 Elsevier Interactive Patient Education  2017 Elsevier Inc.  

## 2017-03-20 ENCOUNTER — Other Ambulatory Visit: Payer: Self-pay | Admitting: Family Medicine

## 2017-03-21 ENCOUNTER — Telehealth: Payer: Self-pay

## 2017-03-21 ENCOUNTER — Telehealth: Payer: Self-pay | Admitting: Family Medicine

## 2017-03-21 MED ORDER — LEVOFLOXACIN 500 MG PO TABS: 500.0000 mg | ORAL_TABLET | Freq: Every day | ORAL | 0 refills | Status: AC

## 2017-03-21 NOTE — Telephone Encounter (Signed)
Pt called clinic and left VM on triage line that the "meds aren't helping" and she "doesn't feel better." Attempted to contact Pt back to see what her symptoms were, she did not answer and no VM set up. Will route.

## 2017-03-21 NOTE — Telephone Encounter (Signed)
Attempted to contact Pt. No answer and no VM set up.

## 2017-03-21 NOTE — Telephone Encounter (Signed)
Pt called and stated that the azithromycin she was proscribed for her acute sinusitis. Pt states she took the last pill today and she is still having nose bleeds, coughing, and sneezing. Pt stated she does not have a fever. Pt wants to know if there is anything else she can take? Please advise?

## 2017-03-21 NOTE — Telephone Encounter (Signed)
Please see other phone note

## 2017-03-21 NOTE — Telephone Encounter (Signed)
If there are no red flag symptoms such as high fever etc. then she can pick up prescription for new antibiotic for Levaquin.  If she is not better after this then she needs to come in for an office visit. Beatrice Lecher, MD

## 2017-03-23 NOTE — Telephone Encounter (Signed)
See other note

## 2017-03-23 NOTE — Telephone Encounter (Signed)
Patient advised of recommendations.  

## 2017-04-01 ENCOUNTER — Ambulatory Visit: Payer: Medicare Other | Admitting: Family Medicine

## 2017-04-07 ENCOUNTER — Encounter: Payer: Self-pay | Admitting: Family Medicine

## 2017-04-07 ENCOUNTER — Ambulatory Visit (INDEPENDENT_AMBULATORY_CARE_PROVIDER_SITE_OTHER): Payer: Medicare Other | Admitting: Family Medicine

## 2017-04-07 ENCOUNTER — Other Ambulatory Visit (HOSPITAL_COMMUNITY)
Admission: RE | Admit: 2017-04-07 | Discharge: 2017-04-07 | Disposition: A | Discharge status: Home / Self Care | Payer: Medicare Other | Source: Ambulatory Visit | Attending: Family Medicine | Admitting: Family Medicine

## 2017-04-07 VITALS — BP 124/75 | HR 87 | Ht 62.0 in | Wt 217.0 lb

## 2017-04-07 DIAGNOSIS — R300 Dysuria: Secondary | ICD-10-CM | POA: Diagnosis present

## 2017-04-07 DIAGNOSIS — N760 Acute vaginitis: Secondary | ICD-10-CM | POA: Insufficient documentation

## 2017-04-07 LAB — POCT URINALYSIS DIPSTICK
Bilirubin, UA: NEGATIVE
Blood, UA: NEGATIVE
Glucose, UA: >=1000
Ketones, UA: NEGATIVE
Leukocytes, UA: NEGATIVE
Nitrite, UA: NEGATIVE
Protein, UA: NEGATIVE
Spec Grav, UA: <=1.005 — AB (ref 1.010–1.025)
Urobilinogen, UA: 0.2 E.U./dL
pH, UA: 5.5 (ref 5.0–8.0)

## 2017-04-07 LAB — WET PREP FOR TRICH, YEAST, CLUE
MICRO NUMBER:: 81402512
Specimen Quality: ADEQUATE

## 2017-04-07 MED ORDER — FLUCONAZOLE 150 MG PO TABS: 150.0000 mg | ORAL_TABLET | Freq: Once | ORAL | 0 refills | Status: AC

## 2017-04-07 NOTE — Progress Notes (Signed)
Subjective:    Patient ID: Amanda Morris, female    DOB: 25-Jan-1951, 66 y.o.   MRN: [id number]  HPI 66-year-old female w/ hx of hysterectomy comes in today complaining of abdominal pain and vaginal discharge. 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  She complains that for about the last 3 weeks she has had some suprapubic pain and pressure as well as some vaginal discharge.  She is been using Monistat 7 for about the last 3-4 days as well as vaginal still she says it burns to apply it but really feels like it is not feeling much better.  No worsening or alleviating factors.  She says this time she would really like to have a Pap smears since she has had similar symptoms in the past.  She has a prior history of hysterectomy.  Review of Systems  BP 124/75   Pulse 87   Ht 5' 2"  (1.575 m)   Wt 217 lb (98.4 kg)   BMI 39.69 kg/m     Allergies  Allergen Reactions  . Victoza [Liraglutide] Other (See Comments)    Abdominal pain  . Penicillins Hives  . Aspirin Nausea Only  . Codeine Itching  . Metformin And Related Other (See Comments)    Nausea and diarrhea.     Past Medical History:  Diagnosis Date  . Allergy   . Diabetes mellitus without complication ([place])   . GERD (gastroesophageal reflux disease)   . Headache    migraines last one 2 weeks ago  . History of hiatal hernia   . Palpitations    in the past, no current issues per patient    Past Surgical History:  Procedure Laterality Date  . ABDOMINAL HYSTERECTOMY  1982  . BACK SURGERY    . BREAST REDUCTION SURGERY Bilateral 03/17/2016   Procedure: MAMMARY REDUCTION  (BREAST);  Surgeon: Wallace Going, DO;  Location: [facility];  Service: Plastics;  Laterality: Bilateral;  . SHOULDER SURGERY  06/2008, 09/2010   Right, by Dr. Karie Soda, then Dr. Judeth Horn    Social History   Socioeconomic History  . Marital status: Single    Spouse name: Not on file  . Number  of children: 1  . Years of education: 10th grade  . Highest education level: Not on file  Social Needs  . Financial resource strain: Not on file  . Food insecurity - worry: Not on file  . Food insecurity - inability: Not on file  . Transportation needs - medical: Not on file  . Transportation needs - non-medical: Not on file  Occupational History  . Occupation: Disabled.   Tobacco Use  . Smoking status: Former Smoker    Types: Cigarettes    Last attempt to quit: 07/30/2007    Years since quitting: 9.6  . Smokeless tobacco: Never Used  Substance and Sexual Activity  . Alcohol use: No  . Drug use: No  . Sexual activity: No  Other Topics Concern  . Not on file  Social History Narrative   Caffeine intake. Some regular exercise.    Family History  Problem Relation Age of Onset  . Heart disease Mother   . Diabetes Mother   . Hypertension Mother   . Stroke Mother   . Heart disease Father   . Heart disease Sister   . Diabetes Sister   . Hyperlipidemia Sister   . Hypertension Sister   . Stroke Sister   .  Alcohol abuse Brother   . Hyperlipidemia Brother     Outpatient Encounter Medications as of 64/13/2018  Medication Sig  . ACCU-CHEK AVIVA PLUS test strip USE AS DIRECTED TO TEST BLOOD SUGAR 3 TIMES A DAY  . ACCU-CHEK SOFTCLIX LANCETS lancets USE AS DIRECTED TO TEST BLOOD SUGAR 3 TIMES A DAY  . aspirin EC 81 MG tablet Take 81 mg by mouth daily.  . B-D ULTRAFINE III SHORT PEN 31G X 8 MM MISC USE AS DIRECTED  . blood glucose meter kit and supplies KIT Dispense glucometer, test strips, and lancets. Use to check blood sugar twice daily. Dx: E11.9  . Cholecalciferol (D3-1000 PO) Take 1 capsule by mouth daily.  Marland Kitchen DEXILANT 60 MG capsule Take 1 capsule by mouth daily.  Marland Kitchen dicyclomine (BENTYL) 10 MG capsule Take 10 mg by mouth 4 (four) times daily.  . empagliflozin (JARDIANCE) 25 MG TABS tablet Take 25 mg by mouth daily.  . famotidine (PEPCID) 40 MG tablet Take 40 mg by mouth every  evening.  . [EXPIRED] fluconazole (DIFLUCAN) 150 MG tablet Take 1 tablet (150 mg total) by mouth once for 1 dose.  . gabapentin (NEURONTIN) 300 MG capsule 1 THREE TIMES DAILY. MAX OF 3600MG/DAY  . glipiZIDE (GLUCOTROL) 10 MG tablet TAKE 1 TABLET TWICE A DAY  . Insulin Glargine (LANTUS SOLOSTAR) 100 UNIT/ML Solostar Pen Inject 36 Units into the skin daily at 10 pm.  . levocetirizine (XYZAL) 5 MG tablet Take 1 tablet (5 mg total) every evening by mouth.  . Multiple Vitamins-Minerals (MULTIVITAMIN WOMEN 50+ PO) Take 1 capsule by mouth daily.  . naproxen sodium (ALEVE) 220 MG tablet Take 220-440 mg by mouth 2 (two) times daily as needed.  Marland Kitchen perphenazine (TRILAFON) 8 MG tablet TAKE ONE TABLET (8 MG TOTAL) BY MOUTH AT BEDTIME.  . pravastatin (PRAVACHOL) 40 MG tablet TAKE 1 TABLET (40 MG TOTAL) BY MOUTH QHS.   No facility-administered encounter medications on file as of 64/13/2018.          Objective:   Physical Exam  Constitutional: She is oriented to person, place, and time. She appears well-developed and well-nourished.  HENT:  Head: Normocephalic and atraumatic.  Eyes: Conjunctivae and EOM are normal.  Cardiovascular: Normal rate.  Pulmonary/Chest: Effort normal.  Genitourinary: Vagina normal. There is no rash on the right labia. There is no rash on the left labia.  Genitourinary Comments: She has a lot of thick white cream from the monistat so hard to get a view of the mucosa but I do not see any lesions or tears.  Vaginal cuff appears normal.  Neurological: She is alert and oriented to person, place, and time.  Skin: Skin is dry. No pallor.  Psychiatric: She has a normal mood and affect. Her behavior is normal.  Vitals reviewed.       Assessment & Plan:  Vaginitis-wet prep performed though she had already applied Monistat this morning so certainly could affect the test.  I am going to go ahead and treat with Diflucan and will call with results of wet prep once available.  Though it  will likely be off because of the cream that she is applied.  I did not see any specific lesions or ulcerations to suggest the things like herpes etc.  Vaginal cuff swab - will call with results.

## 2017-04-08 LAB — URINE CULTURE
MICRO NUMBER:: 81402905
Result:: NO GROWTH
SPECIMEN QUALITY:: ADEQUATE

## 2017-04-11 ENCOUNTER — Ambulatory Visit: Payer: Medicare Other | Admitting: Family Medicine

## 2017-04-11 LAB — CYTOLOGY - PAP: Diagnosis: NEGATIVE

## 2017-04-14 DIAGNOSIS — K219 Gastro-esophageal reflux disease without esophagitis: Secondary | ICD-10-CM | POA: Diagnosis not present

## 2017-04-14 DIAGNOSIS — K589 Irritable bowel syndrome without diarrhea: Secondary | ICD-10-CM | POA: Diagnosis not present

## 2017-04-14 DIAGNOSIS — R197 Diarrhea, unspecified: Secondary | ICD-10-CM | POA: Diagnosis not present

## 2017-04-14 DIAGNOSIS — R109 Unspecified abdominal pain: Secondary | ICD-10-CM | POA: Diagnosis not present

## 2017-04-18 DIAGNOSIS — R109 Unspecified abdominal pain: Secondary | ICD-10-CM | POA: Diagnosis not present

## 2017-04-18 DIAGNOSIS — K219 Gastro-esophageal reflux disease without esophagitis: Secondary | ICD-10-CM | POA: Diagnosis not present

## 2017-04-18 DIAGNOSIS — R160 Hepatomegaly, not elsewhere classified: Secondary | ICD-10-CM | POA: Diagnosis not present

## 2017-04-18 DIAGNOSIS — R197 Diarrhea, unspecified: Secondary | ICD-10-CM | POA: Diagnosis not present

## 2017-04-18 DIAGNOSIS — K589 Irritable bowel syndrome without diarrhea: Secondary | ICD-10-CM | POA: Diagnosis not present

## 2017-04-18 DIAGNOSIS — K429 Umbilical hernia without obstruction or gangrene: Secondary | ICD-10-CM | POA: Diagnosis not present

## 2017-04-18 DIAGNOSIS — K449 Diaphragmatic hernia without obstruction or gangrene: Secondary | ICD-10-CM | POA: Diagnosis not present

## 2017-04-21 ENCOUNTER — Telehealth: Payer: Self-pay | Admitting: Family Medicine

## 2017-04-21 DIAGNOSIS — N6489 Other specified disorders of breast: Secondary | ICD-10-CM

## 2017-04-21 DIAGNOSIS — N649 Disorder of breast, unspecified: Secondary | ICD-10-CM

## 2017-04-21 NOTE — Telephone Encounter (Signed)
Pt had a CT done at [facility] and was advised she should have another mammogram done before she is due.   Per radiology read " Indeterminate soft tissue lesion involving the posterior inferior left breast/chest wall. This is most likely related to postsurgical change given the history of breast reduction. No definite lesion seen on the recent mammogram of October 15, 2016. Therefore  suggest follow-up diagnostic mammography and ultrasound with attention directed to this region."  Will route to PCP to see if she agrees, if so - order needs to be placed.

## 2017-04-21 NOTE — Telephone Encounter (Signed)
Orders placed but wasn't sure what location she wanted?

## 2017-04-22 NOTE — Telephone Encounter (Signed)
Last mammo at [facility].

## 2017-04-22 NOTE — Telephone Encounter (Signed)
Pt advised.

## 2017-04-27 DIAGNOSIS — N6489 Other specified disorders of breast: Secondary | ICD-10-CM | POA: Diagnosis not present

## 2017-04-27 DIAGNOSIS — N649 Disorder of breast, unspecified: Secondary | ICD-10-CM | POA: Diagnosis not present

## 2017-04-27 DIAGNOSIS — R928 Other abnormal and inconclusive findings on diagnostic imaging of breast: Secondary | ICD-10-CM | POA: Diagnosis not present

## 2017-04-27 LAB — HM MAMMOGRAPHY

## 2017-05-09 ENCOUNTER — Encounter: Payer: Self-pay | Admitting: Family Medicine

## 2017-05-09 DIAGNOSIS — M5137 Other intervertebral disc degeneration, lumbosacral region: Secondary | ICD-10-CM | POA: Diagnosis not present

## 2017-05-09 DIAGNOSIS — M545 Low back pain: Secondary | ICD-10-CM | POA: Diagnosis not present

## 2017-05-17 DIAGNOSIS — Z6841 Body Mass Index (BMI) 40.0 and over, adult: Secondary | ICD-10-CM | POA: Diagnosis not present

## 2017-05-17 DIAGNOSIS — R030 Elevated blood-pressure reading, without diagnosis of hypertension: Secondary | ICD-10-CM | POA: Diagnosis not present

## 2017-05-17 DIAGNOSIS — M47816 Spondylosis without myelopathy or radiculopathy, lumbar region: Secondary | ICD-10-CM | POA: Diagnosis not present

## 2017-05-24 ENCOUNTER — Other Ambulatory Visit: Payer: Self-pay | Admitting: Family Medicine

## 2017-05-25 ENCOUNTER — Ambulatory Visit: Payer: Medicare Other | Admitting: Osteopathic Medicine

## 2017-05-25 DIAGNOSIS — Z0189 Encounter for other specified special examinations: Secondary | ICD-10-CM

## 2017-06-06 ENCOUNTER — Encounter: Payer: Self-pay | Admitting: Family Medicine

## 2017-06-06 ENCOUNTER — Ambulatory Visit (INDEPENDENT_AMBULATORY_CARE_PROVIDER_SITE_OTHER): Payer: Medicare Other | Admitting: Family Medicine

## 2017-06-06 VITALS — BP 133/84 | HR 103 | Ht 62.0 in | Wt 219.0 lb

## 2017-06-06 DIAGNOSIS — Z23 Encounter for immunization: Secondary | ICD-10-CM | POA: Diagnosis not present

## 2017-06-06 DIAGNOSIS — E119 Type 2 diabetes mellitus without complications: Secondary | ICD-10-CM

## 2017-06-06 DIAGNOSIS — J019 Acute sinusitis, unspecified: Secondary | ICD-10-CM | POA: Diagnosis not present

## 2017-06-06 MED ORDER — CEFDINIR 300 MG PO CAPS: 300.0000 mg | ORAL_CAPSULE | Freq: Two times a day (BID) | ORAL | 0 refills | Status: DC

## 2017-06-06 MED ORDER — BLOOD GLUCOSE MONITOR KIT: PACK | 99 refills | Status: DC

## 2017-06-06 NOTE — Addendum Note (Signed)
Addended by: Beatrice Lecher D on: 06/06/2017 03:02 PM   Modules accepted: Orders

## 2017-06-06 NOTE — Patient Instructions (Addendum)

## 2017-06-06 NOTE — Progress Notes (Addendum)
   Subjective:    Patient ID: Amanda Morris, female    DOB: 18-Jan-1951, 67 y.o.   MRN: [id number]  HPI 67 year old female comes in today complaining that she has been congested for about a month.  She is been getting some intermittent nosebleeds and some intermittent headaches as well.  She does have a history of nosebleeds.  She does take an 81 mg aspirin daily.  She has had a cough but feels like it is mostly in the upper chest.  And sometimes she will cough up a little blood.  No wheezing or shortness of breath.  She has been using some over-the-counter Delsym.  She is not using any nasal sprays.  She wonders if it could be allergies.  Diabetes-she is also been having some problems with her glucometer.  She says its reading high but yet she feels symptomatic like her sugars actually low.  She has had the same machine for about 3 years.  She even called the company and has been trying to work with them to get it to work correctly.   Review of Systems     Objective:   Physical Exam  Constitutional: She is oriented to person, place, and time. She appears well-developed and well-nourished.  HENT:  Head: Normocephalic and atraumatic.  Right Ear: External ear normal.  Left Ear: External ear normal.  Nose: Nose normal.  Mouth/Throat: Oropharynx is clear and moist.  TMs and canals are clear.  Septum on the left nostril is very inflamed and irritated.  Less so on the right side.  No open wounds or ulcerations or active bleeding.  Eyes: Conjunctivae and EOM are normal. Pupils are equal, round, and reactive to light.  Neck: Neck supple. No thyromegaly present.  Cardiovascular: Normal rate, regular rhythm and normal heart sounds.  Pulmonary/Chest: Effort normal and breath sounds normal. She has no wheezes.  Lymphadenopathy:    She has no cervical adenopathy.  Neurological: She is alert and oriented to person, place, and time.  Skin: Skin is warm and dry.  Psychiatric: She has a normal mood and  affect.        Assessment & Plan:  Acute sinusitis with some allergic rhinitis-we will treat with Ceftin ear times 10 days and add generic Flonase.  If not improving by the end of the week then please give [place] a call or if she suddenly gets worse please give [place] a call back as well.  Avoid blowing the nose hard or frequently.  Try to keep the area moisturized.  Diabetes-we will send over new prescription for glucometer.

## 2017-06-07 DIAGNOSIS — M461 Sacroiliitis, not elsewhere classified: Secondary | ICD-10-CM | POA: Diagnosis not present

## 2017-06-09 ENCOUNTER — Telehealth: Payer: Self-pay

## 2017-06-09 NOTE — Telephone Encounter (Signed)
Amanda Morris called and states she is taking mucinex, Flonase and cefdinir. She still feels bad with runny nose, sneezing, sore throat, nose bleeds and cough. She would like recommendations to what else she can take for the symptoms. Please advise.

## 2017-06-10 NOTE — Telephone Encounter (Signed)
Patient advised.

## 2017-06-10 NOTE — Telephone Encounter (Signed)
Attempted to contact Pt, no answer and VM not set up.

## 2017-06-10 NOTE — Telephone Encounter (Signed)
Have her add Allegra 180mg  daily starting today and over the weekend and see if she starts feeling better by Monday.

## 2017-07-13 DIAGNOSIS — G894 Chronic pain syndrome: Secondary | ICD-10-CM | POA: Diagnosis not present

## 2017-07-19 ENCOUNTER — Ambulatory Visit (INDEPENDENT_AMBULATORY_CARE_PROVIDER_SITE_OTHER): Payer: Medicare Other | Admitting: Family Medicine

## 2017-07-19 ENCOUNTER — Encounter: Payer: Self-pay | Admitting: Family Medicine

## 2017-07-19 ENCOUNTER — Ambulatory Visit: Payer: Medicare Other | Admitting: Family Medicine

## 2017-07-19 VITALS — BP 127/73 | HR 95 | Ht 62.0 in | Wt 218.0 lb

## 2017-07-19 DIAGNOSIS — I10 Essential (primary) hypertension: Secondary | ICD-10-CM | POA: Diagnosis not present

## 2017-07-19 DIAGNOSIS — E119 Type 2 diabetes mellitus without complications: Secondary | ICD-10-CM

## 2017-07-19 LAB — POCT UA - MICROALBUMIN
Albumin/Creatinine Ratio, Urine, POC: <30
Creatinine, POC: 200 mg/dL
Microalbumin Ur, POC: 30 mg/L

## 2017-07-19 LAB — POCT GLYCOSYLATED HEMOGLOBIN (HGB A1C): Hemoglobin A1C: 6.4

## 2017-07-19 NOTE — Patient Instructions (Signed)
Okay to decrease Lantus down to 26 units.

## 2017-07-19 NOTE — Progress Notes (Signed)
Subjective:    CC: DM, HTN  HPI:  Diabetes -she brought in her home blood glucose log.  She has had a couple of hypoglycemic events in the 60s.  She actually decreased her Lantus down to 28 units but has still gotten a few blood sugars in the 70s.  No wounds or sores that are not healing well. No increased thirst or urination. Checking glucose at home. Taking medications as prescribed without any side effects.  We ordered a new glucometer when I saw her in February because she was getting some very irregular readings even after calling the company.  Unfortunately she says she never received a new glucometer.  Hypertension- Pt denies chest pain, SOB, dizziness, or heart palpitations.  Taking meds as directed w/o problems.  Denies medication side effects.       Past medical history, Surgical history, Family history not pertinant except as noted below, Social history, Allergies, and medications have been entered into the medical record, reviewed, and corrections made.   Review of Systems: No fevers, chills, night sweats, weight loss, chest pain, or shortness of breath.   Objective:    General: Well Developed, well nourished, and in no acute distress.  Neuro: Alert and oriented x3, extra-ocular muscles intact, sensation grossly intact.  HEENT: Normocephalic, atraumatic  Skin: Warm and dry, no rashes. Cardiac: Regular rate and rhythm, no murmurs rubs or gallops, no lower extremity edema.  Respiratory: Clear to auscultation bilaterally. Not using accessory muscles, speaking in full sentences.   Impression and Recommendations:    DM -uncontrolled.  Heme globin A1c is down to 6.4 which is absolutely fantastic.  She has had a couple of lows so we will decrease her Lantus from 28 units down to 26 units.  Continue glipizide and Jardiance. On statin.  On the pharmacy.  They said they never received the prescription for the glucometer even though we faxed it in February.  We reprinted it and re-faxed  it today so hopefully she can pick up a new meter today.  HTN  - Well controlled. Continue current regimen. Follow up in  4 months.    She will have to get her Tdap and Shingrix at the pharmacy.

## 2017-07-20 LAB — BASIC METABOLIC PANEL WITH GFR
BUN/Creatinine Ratio: 31 (calc) — ABNORMAL HIGH (ref 6–22)
BUN: 28 mg/dL — ABNORMAL HIGH (ref 7–25)
CO2: 24 mmol/L (ref 20–32)
Calcium: 8.9 mg/dL (ref 8.6–10.4)
Chloride: 109 mmol/L (ref 98–110)
Creat: 0.91 mg/dL (ref 0.50–0.99)
GFR, Est African American: 77 mL/min/{1.73_m2} (ref >=60)
GFR, Est Non African American: 67 mL/min/{1.73_m2} (ref >=60)
Glucose, Bld: 69 mg/dL (ref 65–99)
Potassium: 3.7 mmol/L (ref 3.5–5.3)
Sodium: 143 mmol/L (ref 135–146)

## 2017-07-20 NOTE — Progress Notes (Signed)
All labs are normal. 

## 2017-07-26 ENCOUNTER — Other Ambulatory Visit: Payer: Self-pay | Admitting: *Deleted

## 2017-07-26 DIAGNOSIS — Z23 Encounter for immunization: Secondary | ICD-10-CM

## 2017-07-26 MED ORDER — AMBULATORY NON FORMULARY MEDICATION
0 refills | Status: DC
Start: 2017-07-26 — End: 2017-10-17

## 2017-08-01 DIAGNOSIS — M961 Postlaminectomy syndrome, not elsewhere classified: Secondary | ICD-10-CM | POA: Diagnosis not present

## 2017-08-09 ENCOUNTER — Ambulatory Visit (INDEPENDENT_AMBULATORY_CARE_PROVIDER_SITE_OTHER): Payer: Medicare Other | Admitting: Family Medicine

## 2017-08-09 ENCOUNTER — Encounter: Payer: Self-pay | Admitting: Family Medicine

## 2017-08-09 VITALS — BP 122/69 | HR 75 | Temp 98.1°F | Ht 62.0 in | Wt 225.0 lb

## 2017-08-09 DIAGNOSIS — J0101 Acute recurrent maxillary sinusitis: Secondary | ICD-10-CM | POA: Diagnosis not present

## 2017-08-09 MED ORDER — PREDNISONE 20 MG PO TABS: ORAL_TABLET | ORAL | 0 refills | Status: AC

## 2017-08-09 MED ORDER — DOXYCYCLINE HYCLATE 100 MG PO TABS: 100.0000 mg | ORAL_TABLET | Freq: Two times a day (BID) | ORAL | 0 refills | Status: DC

## 2017-08-09 NOTE — Progress Notes (Signed)
   Subjective:    Patient ID: Amanda Morris, female    DOB: [birth year], 67 y.o.   MRN: [id number]  HPI 67 year old female comes in today complaining of nosebleeds and green discharge from her nose as well as coughing up some blood x 2.5 weeks.  Is actually seen in February and diagnosed with sinusitis and treated with Omnicef at that time.  She is also been taking Allegra/Zyrtec/Benadryl.  She is been alternating and doubling up at times.  She is also been using her Flonase and at times Mucinex.  She did get better initially after the round of Omnicef but after I saw her towards the end of March she started feeling orally again.  She denies any fever sweats or chills.  She is a lot of facial pressure over both maxillary sinuses and both ears have been hurting.  Review of Systems     Objective:   Physical Exam  Constitutional: She is oriented to person, place, and time. She appears well-developed and well-nourished.  HENT:  Head: Normocephalic and atraumatic.  Right Ear: External ear normal.  Left Ear: External ear normal.  Nose: Nose normal.  Mouth/Throat: Oropharynx is clear and moist.  TMs and canals are clear.   Eyes: Pupils are equal, round, and reactive to light. Conjunctivae and EOM are normal.  Neck: Neck supple. No thyromegaly present.  Cardiovascular: Normal rate, regular rhythm and normal heart sounds.  Pulmonary/Chest: Effort normal and breath sounds normal. She has no wheezes.  Lymphadenopathy:    She has no cervical adenopathy.  Neurological: She is alert and oriented to person, place, and time.  Skin: Skin is warm and dry.  Psychiatric: She has a normal mood and affect.        Assessment & Plan:  Acute recurrent sinusitis-we will treat with 14 days of doxycycline.  I chose a different antibiotic because I want to avoid any resistance in addition she has a penicillin allergy.  We will also put her on 8 days of prednisone.  It sounds like she has had some type of  prednisone in the past for more severe allergies and that was actually helpful.  Call if not significantly better in 1 week.

## 2017-08-19 ENCOUNTER — Other Ambulatory Visit: Payer: Self-pay | Admitting: *Deleted

## 2017-08-19 MED ORDER — LEVOCETIRIZINE DIHYDROCHLORIDE 5 MG PO TABS: 5.0000 mg | ORAL_TABLET | Freq: Every evening | ORAL | 3 refills | Status: DC

## 2017-08-22 ENCOUNTER — Telehealth: Payer: Self-pay | Admitting: Family Medicine

## 2017-08-22 DIAGNOSIS — J0101 Acute recurrent maxillary sinusitis: Secondary | ICD-10-CM

## 2017-08-22 NOTE — Telephone Encounter (Signed)
Pt called and stated she has seen you twice in the past few weeks for her sinuses and stated they are not getting any better. She stated she is still congested and that her nose bleeds when she blows it. She is wanting to know if there is anything that can help her or will she need to come in for a third time and be looked at. Thanks

## 2017-08-22 NOTE — Telephone Encounter (Signed)
Order placed, authorized, and imaging notified.

## 2017-08-22 NOTE — Telephone Encounter (Signed)
Pt advised, she would like to proceed with CT scan.

## 2017-08-22 NOTE — Telephone Encounter (Signed)
If she is not better than I think we should actually get a sinus limited CT for further work-up or consider referral to ENT.  Please see if she has a preference.  Okay to place order

## 2017-08-24 ENCOUNTER — Ambulatory Visit (INDEPENDENT_AMBULATORY_CARE_PROVIDER_SITE_OTHER): Payer: Medicare Other

## 2017-08-24 DIAGNOSIS — J0101 Acute recurrent maxillary sinusitis: Secondary | ICD-10-CM | POA: Diagnosis not present

## 2017-08-24 MED ORDER — FLUTICASONE PROPIONATE 50 MCG/ACT NA SUSP: 1.0000 | Freq: Every day | NASAL | 6 refills | Status: DC

## 2017-08-24 MED ORDER — CEFUROXIME AXETIL 500 MG PO TABS: 500.0000 mg | ORAL_TABLET | Freq: Two times a day (BID) | ORAL | 0 refills | Status: DC

## 2017-08-25 ENCOUNTER — Other Ambulatory Visit: Payer: Self-pay | Admitting: Family Medicine

## 2017-08-31 ENCOUNTER — Other Ambulatory Visit: Payer: Self-pay | Admitting: Family Medicine

## 2017-09-06 DIAGNOSIS — I10 Essential (primary) hypertension: Secondary | ICD-10-CM | POA: Diagnosis not present

## 2017-09-06 DIAGNOSIS — T383X1A Poisoning by insulin and oral hypoglycemic [antidiabetic] drugs, accidental (unintentional), initial encounter: Secondary | ICD-10-CM | POA: Diagnosis not present

## 2017-09-06 DIAGNOSIS — E785 Hyperlipidemia, unspecified: Secondary | ICD-10-CM | POA: Diagnosis not present

## 2017-09-06 DIAGNOSIS — K219 Gastro-esophageal reflux disease without esophagitis: Secondary | ICD-10-CM | POA: Diagnosis not present

## 2017-09-06 DIAGNOSIS — Z79899 Other long term (current) drug therapy: Secondary | ICD-10-CM | POA: Diagnosis not present

## 2017-09-06 DIAGNOSIS — E119 Type 2 diabetes mellitus without complications: Secondary | ICD-10-CM | POA: Diagnosis not present

## 2017-09-06 DIAGNOSIS — Z885 Allergy status to narcotic agent status: Secondary | ICD-10-CM | POA: Diagnosis not present

## 2017-09-06 DIAGNOSIS — Z888 Allergy status to other drugs, medicaments and biological substances status: Secondary | ICD-10-CM | POA: Diagnosis not present

## 2017-09-06 DIAGNOSIS — Z794 Long term (current) use of insulin: Secondary | ICD-10-CM | POA: Diagnosis not present

## 2017-09-06 DIAGNOSIS — Z880 Allergy status to penicillin: Secondary | ICD-10-CM | POA: Diagnosis not present

## 2017-09-06 DIAGNOSIS — Z886 Allergy status to analgesic agent status: Secondary | ICD-10-CM | POA: Diagnosis not present

## 2017-09-06 DIAGNOSIS — Z7982 Long term (current) use of aspirin: Secondary | ICD-10-CM | POA: Diagnosis not present

## 2017-09-09 ENCOUNTER — Other Ambulatory Visit: Payer: Self-pay | Admitting: Family Medicine

## 2017-09-09 DIAGNOSIS — E119 Type 2 diabetes mellitus without complications: Secondary | ICD-10-CM

## 2017-09-14 ENCOUNTER — Other Ambulatory Visit: Payer: Self-pay | Admitting: Family Medicine

## 2017-09-14 DIAGNOSIS — E119 Type 2 diabetes mellitus without complications: Secondary | ICD-10-CM

## 2017-09-20 DIAGNOSIS — K219 Gastro-esophageal reflux disease without esophagitis: Secondary | ICD-10-CM | POA: Diagnosis not present

## 2017-09-20 DIAGNOSIS — R194 Change in bowel habit: Secondary | ICD-10-CM | POA: Diagnosis not present

## 2017-09-20 DIAGNOSIS — R110 Nausea: Secondary | ICD-10-CM | POA: Diagnosis not present

## 2017-09-20 DIAGNOSIS — K589 Irritable bowel syndrome without diarrhea: Secondary | ICD-10-CM | POA: Diagnosis not present

## 2017-10-04 DIAGNOSIS — M961 Postlaminectomy syndrome, not elsewhere classified: Secondary | ICD-10-CM | POA: Diagnosis not present

## 2017-10-06 ENCOUNTER — Encounter: Payer: Self-pay | Admitting: Family Medicine

## 2017-10-06 ENCOUNTER — Ambulatory Visit: Payer: Medicare Other | Admitting: Family Medicine

## 2017-10-06 VITALS — BP 120/78 | HR 85 | Ht 64.0 in | Wt 221.0 lb

## 2017-10-06 DIAGNOSIS — R202 Paresthesia of skin: Secondary | ICD-10-CM

## 2017-10-06 DIAGNOSIS — M7062 Trochanteric bursitis, left hip: Secondary | ICD-10-CM | POA: Diagnosis not present

## 2017-10-06 NOTE — Progress Notes (Signed)
Amanda Morris is a 67 y.o. female who presents to [facility] today for left leg pain.  Amanda Morris notes a history of mild pain in the left lateral hip for years. She notes that the pain worsened recently. Two days ago she received what sounds like a nerve conduction study and had pain radiating down her left leg. She no longer has radiating pain beyond the knee. She does however have pain in the left lateral hip and some numbness and tingling in the left lateral upper leg. Her PMH is significant for spondylolisthesis status post stabilization surgery 2017.  She notes that the pain is moderate and worse with ambulation. The pain is also bothersome with laying on the left side. She has tried some OTC medications that helped a bit.    ROS:  As above  Exam:  BP 120/78   Pulse 85   Ht _0  (1.626 m)   Wt 221 lb (100.2 kg)   BMI 37.93 kg/m  General: Well Developed, well nourished, and in no acute distress.  Neuro/Psych: Alert and oriented x3, extra-ocular muscles intact, able to move all 4 extremities, sensation grossly intact. Skin: Warm and dry, no rashes noted.  Respiratory: Not using accessory muscles, speaking in full sentences, trachea midline.  Cardiovascular: Pulses palpable, no extremity edema. Abdomen: Does not appear distended. MSK:  Left hip: Normal ROM TTP greater trochanter Hip abduction weak 3+/5 No change in paresthesia with pressure at ASIS.  Antalgic gait present.    Lab and Radiology Results MRI lspine 2018 images reviewed.  IMPRESSION: Good appearance at the discectomy and fusion level of L4-5.  L3-4 and L5-S1 show mild disc bulges and facet osteoarthritis. No compressive stenosis. The findings could be associated with back pain.   Electronically Signed   By: Nelson Chimes M.D.   On: 09/25/2016 11:03 I personally (independently) visualized and performed the interpretation of the images attached in this  note.  Assessment and Plan: 67 y.o. female with  Left lateral hip pain is likely trochanteric bursitis. Plan for referral to PT and home exercise program.   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 Plan for watchful waiting with weight loss. If no improvement consider US guided injection.     Orders Placed This Encounter  Procedures  . Ambulatory referral to Physical Therapy    Referral Priority:   Routine    Referral Type:   Physical Medicine    Referral Reason:   Specialty Services Required    Requested Specialty:   Physical Therapy   No orders of the defined types were placed in this encounter.   Historical information moved to improve visibility of documentation.  Past Medical History:  Diagnosis Date  . Allergy   . Diabetes mellitus without complication ([place])   . GERD (gastroesophageal reflux disease)   . Headache    migraines last one 2 weeks ago  . History of hiatal hernia   . Palpitations    in the past, no current issues per patient   Past Surgical History:  Procedure Laterality Date  . ABDOMINAL HYSTERECTOMY  1982  . BACK SURGERY    . BREAST REDUCTION SURGERY Bilateral 03/17/2016   Procedure: MAMMARY REDUCTION  (BREAST);  Surgeon: Wallace Going, DO;  Location: [facility];  Service: Plastics;  Laterality: Bilateral;  . SHOULDER SURGERY  06/2008, 09/2010   Right, by Dr. Karie Soda, then Dr. Judeth Horn   Social History   Tobacco Use  . Smoking status:  Former Smoker    Types: Cigarettes    Last attempt to quit: 07/30/2007    Years since quitting: 10.1  . Smokeless tobacco: Never Used  Substance Use Topics  . Alcohol use: No   family history includes Alcohol abuse in her brother; Diabetes in her mother and sister; Heart disease in her father, mother, and sister; Hyperlipidemia in her brother and sister; Hypertension in her mother and sister; Stroke in her mother and sister.  Medications: Current Outpatient Medications  Medication  Sig Dispense Refill  . ACCU-CHEK SOFTCLIX LANCETS lancets USE AS DIRECTED TO TEST BLOOD SUGAR 3 TIMES A DAY 100 each 10  . AMBULATORY NON FORMULARY MEDICATION Medication Name: shingrix x1 IM 1 vial 0  . AMBULATORY NON FORMULARY MEDICATION Medication Name: Tdap x 1 IM 1 vial 0  . aspirin EC 81 MG tablet Take 81 mg by mouth daily.    . B-D ULTRAFINE III SHORT PEN 31G X 8 MM MISC USE AS DIRECTED 100 each 6  . blood glucose meter kit and supplies KIT Dispense glucometer, test strips, and lancets. Use to check blood sugar twice daily. Dx: E11.9 1 each PRN  . cefUROXime (CEFTIN) 500 MG tablet Take 1 tablet (500 mg total) by mouth 2 (two) times daily with a meal. 56 tablet 0  . Cholecalciferol (D3-1000 PO) Take 1 capsule by mouth daily.    Marland Kitchen DEXILANT 60 MG capsule Take 1 capsule by mouth daily.  3  . dicyclomine (BENTYL) 10 MG capsule Take 10 mg by mouth 4 (four) times daily.    . Diphtheria-Tetanus Toxoids DT 25-5 LFU/0.5ML SUSP TO BE GIVEN BY [facility]  0  . doxycycline (VIBRA-TABS) 100 MG tablet Take 1 tablet (100 mg total) by mouth 2 (two) times daily. 28 tablet 0  . famotidine (PEPCID) 40 MG tablet Take 40 mg by mouth every evening.  5  . fluticasone (FLONASE) 50 MCG/ACT nasal spray Place 1 spray into both nostrils daily. 16 g 6  . gabapentin (NEURONTIN) 300 MG capsule 1 THREE TIMES DAILY. MAX OF 3600MG/DAY 180 capsule 3  . glipiZIDE (GLUCOTROL) 10 MG tablet TAKE 1 TABLET TWICE A DAY 180 tablet 1  . glucose blood (ACCU-CHEK AVIVA PLUS) test strip For testing blood sugars 3 times daily. Dx:E11.9 300 each 12  . Insulin Glargine (LANTUS SOLOSTAR) 100 UNIT/ML Solostar Pen Inject 36 Units into the skin daily at 10 pm. 5 pen PRN  . JARDIANCE 25 MG TABS tablet TAKE 1 TABLET DAILY 30 tablet 5  . levocetirizine (XYZAL) 5 MG tablet Take 1 tablet (5 mg total) by mouth every evening. 90 tablet 3  . Multiple Vitamins-Minerals (MULTIVITAMIN WOMEN 50+ PO) Take 1 capsule by mouth daily.    . naproxen sodium (ALEVE)  220 MG tablet Take 220-440 mg by mouth 2 (two) times daily as needed.    Marland Kitchen perphenazine (TRILAFON) 8 MG tablet TAKE ONE TABLET (8 MG TOTAL) BY MOUTH AT BEDTIME.  2  . pravastatin (PRAVACHOL) 40 MG tablet TAKE 1 TABLET BY MOUTH AT BEDTIME 90 tablet 3  . PROCTOSOL HC 2.5 % rectal cream APPLY TO AFFECTED AREA 2 TIMES DAILY FOR HEMORRHOIDS  3   No current facility-administered medications for this visit.    Allergies  Allergen Reactions  . Victoza [Liraglutide] Other (See Comments)    Abdominal pain  . Oxycodone Nausea And Vomiting  . Penicillins Hives  . Aspirin Nausea Only  . Codeine Itching  . Hydrocodone Nausea Only  . Metformin And Related  Other (See Comments)    Nausea and diarrhea.       Discussed warning signs or symptoms. Please see discharge instructions. Patient expresses understanding.

## 2017-10-06 NOTE — Patient Instructions (Addendum)
Thank you for coming in today. Attend physical therapy.  Recheck with me in 8 weeks.  Return sooner if worsening or not improving.   I think you have Trochanteric Bursitis.

## 2017-10-15 ENCOUNTER — Other Ambulatory Visit: Payer: Self-pay | Admitting: Family Medicine

## 2017-10-17 ENCOUNTER — Ambulatory Visit: Payer: Medicare Other | Admitting: Family Medicine

## 2017-10-17 ENCOUNTER — Ambulatory Visit: Payer: Medicare Other | Admitting: Physical Therapy

## 2017-10-17 ENCOUNTER — Encounter: Payer: Self-pay | Admitting: Physical Therapy

## 2017-10-17 ENCOUNTER — Encounter: Payer: Self-pay | Admitting: Family Medicine

## 2017-10-17 VITALS — BP 113/70 | HR 84 | Ht 64.0 in | Wt 225.0 lb

## 2017-10-17 DIAGNOSIS — M6281 Muscle weakness (generalized): Secondary | ICD-10-CM

## 2017-10-17 DIAGNOSIS — M25552 Pain in left hip: Secondary | ICD-10-CM

## 2017-10-17 DIAGNOSIS — I10 Essential (primary) hypertension: Secondary | ICD-10-CM | POA: Diagnosis not present

## 2017-10-17 DIAGNOSIS — G609 Hereditary and idiopathic neuropathy, unspecified: Secondary | ICD-10-CM | POA: Diagnosis not present

## 2017-10-17 DIAGNOSIS — E119 Type 2 diabetes mellitus without complications: Secondary | ICD-10-CM | POA: Diagnosis not present

## 2017-10-17 DIAGNOSIS — R2689 Other abnormalities of gait and mobility: Secondary | ICD-10-CM

## 2017-10-17 DIAGNOSIS — K641 Second degree hemorrhoids: Secondary | ICD-10-CM | POA: Diagnosis not present

## 2017-10-17 LAB — POCT GLYCOSYLATED HEMOGLOBIN (HGB A1C): Hemoglobin A1C: 6.8 % — AB (ref 4.0–5.6)

## 2017-10-17 MED ORDER — BLOOD GLUCOSE MONITOR KIT: PACK | 0 refills | Status: DC

## 2017-10-17 NOTE — Therapy (Addendum)
[facility] [place], [place], [address] Phone: [phone]   Fax:  [phone]  Physical Therapy Evaluation  Patient Details  Name: Amanda Morris MRN: [id number] Date of Birth: January 11, 1951 Referring Provider: Dr Lynne Leader   Encounter Date: 10/17/2017  PT End of Session - 10/17/17 0805    Visit Number  1    Number of Visits  2    Date for PT Re-Evaluation  11/14/17    PT Start Time  0805    PT Stop Time  0844    PT Time Calculation (min)  39 min    Equipment Utilized During Treatment  Back brace    Activity Tolerance  Patient limited by pain       Past Medical History:  Diagnosis Date  . Allergy   . Diabetes mellitus without complication ([place])   . GERD (gastroesophageal reflux disease)   . Headache    migraines last one 2 weeks ago  . History of hiatal hernia   . Palpitations    in the past, no current issues per patient    Past Surgical History:  Procedure Laterality Date  . ABDOMINAL HYSTERECTOMY  1982  . BACK SURGERY    . BREAST REDUCTION SURGERY Bilateral 03/17/2016   Procedure: MAMMARY REDUCTION  (BREAST);  Surgeon: Wallace Going, DO;  Location: [facility];  Service: Plastics;  Laterality: Bilateral;  . SHOULDER SURGERY  06/2008, 09/2010   Right, by Dr. Karie Soda, then Dr. Judeth Horn    There were no vitals filed for this visit.   Subjective Assessment - 10/17/17 0805    Subjective  Pt reports she has had Lt hip/leg pain for a while and it has gotten worse around the holidays. She has been going to [employer], on the bike, leg press and other leg work almost every day.     Pertinent History  lumbar fusion L4-5 2017 - needs to have another surgery sees the surgeon next month ( July)     How long can you walk comfortably?  tolerates about 10'    Diagnostic tests  MRI    Patient Stated Goals  find out what is wrong with the Lt leg    Currently in Pain?  No/denies  hurt this morning when she got up, ok now    Pain Location  Leg    Pain Orientation  Left    Pain Descriptors / Indicators  Burning;Aching;Pins and needles    Pain Type  Chronic pain    Pain Onset  More than a month ago    Pain Frequency  Intermittent    Aggravating Factors   standing and walking    Pain Relieving Factors  nothing         [facility] PT Assessment - 10/17/17 0001      Assessment   Medical Diagnosis  Lt hip bursitis    Referring Provider  Dr Lynne Leader    Onset Date/Surgical Date  03/26/17    Hand Dominance  Right    Next MD Visit  8 wks    Prior Therapy  not for hip, only back      Precautions   Precautions  None    Required Braces or Orthoses  Other Brace/Splint    Other Brace/Splint  back brace to help her with pain      Balance Screen   Has the patient fallen in the past 6 months  Yes    How many  times?  1    Has the patient had a decrease in activity level because of a fear of falling?   Yes    Is the patient reluctant to leave their home because of a fear of falling?   No      Home Film/video editor residence    [place]  One level      Prior Function   Level of Independence  Independent    Vocation  Part time employment    Clinical research associate work at school, out for summer    Leisure  gym, hang around her nephews and nieces      Observation/Other Assessments   Focus on Therapeutic Outcomes (FOTO)   62% limited      Functional Tests   Functional tests  Squat;Single leg stance      Squat   Comments  slight shift to Lt - has back and leg pain      Single Leg Stance   Comments  Lt 2 sec, Rt 3 sec      Posture/Postural Control   Posture/Postural Control  Postural limitations    Postural Limitations  Rounded Shoulders;Forward head;Increased thoracic kyphosis extra abdominal girth, posterior trunk lean      ROM / Strength   AROM / PROM / Strength  AROM;Strength      AROM    AROM Assessment Site  Hip;Lumbar    Right/Left Hip  Left Rt WNL    Left Hip External Rotation   62    Left Hip Internal Rotation   35    Lumbar Flexion  4" from floor, pain in Lt leg with return to stand    Lumbar Extension  50% present with back pain    Lumbar - Right Rotation  90% present    Lumbar - Left Rotation  90% present      Strength   Strength Assessment Site  Hip;Knee;Ankle;Lumbar    Right/Left Hip  Left;Right    Right Hip Flexion  5/5    Right Hip Extension  5/5    Right Hip ABduction  5/5    Left Hip Flexion  4/5    Left Hip Extension  4+/5    Left Hip ABduction  3+/5 with pain    Right/Left Knee  Left Rt WNL    Left Knee Flexion  4/5    Left Knee Extension  4+/5    Right/Left Ankle  -- Rt WNL, Lt grossly 4/5    Lumbar Extension  -- multifidi fair      Flexibility   Soft Tissue Assessment /Muscle Length  yes    Hamstrings  supine SLR Lt 65, Rt 84    Quadriceps  prone knee flex Lt 117 with pain, Rt 125    Piriformis  Lt tight      Palpation   Palpation comment  hypersensitive Lt ITB and around greater troch      Special Tests    Special Tests  Lumbar    Lumbar Tests  Straight Leg Raise      Straight Leg Raise   Findings  Positive    Side   Left      Ambulation/Gait   Ambulation/Gait  Yes    Ambulation/Gait Assistance  7: Independent    Ambulation Distance (Feet)  -- observed in clinic    Assistive device  None    Gait Pattern  Decreased stance time -  left;Decreased hip/knee flexion - left;Antalgic                Objective measurements completed on examination: See above findings.      [facility] Adult PT Treatment/Exercise - 10/17/17 0001      Exercises   Exercises  Knee/Hip      Knee/Hip Exercises: Stretches   Passive Hamstring Stretch  Left;30 seconds;2 reps    Quad Stretch  Left;2 reps;30 seconds    ITB Stretch  Left;2 reps;30 seconds a lot of discomfort      Knee/Hip Exercises: Prone   Other Prone Exercises  POE, press ups and  pelvic press             PT Education - 10/17/17 0839    Education Details  HEP    Person(s) Educated  Patient    Methods  Explanation;Handout;Demonstration    Comprehension  Returned demonstration;Verbalized understanding          PT Long Term Goals - 10/17/17 0804      PT LONG TERM GOAL #1   Title  I with advanced HEP (11/14/17)     Time  4    Period  Weeks    Status  New      PT LONG TERM GOAL #2   Title  report =/> 75% reduction in Lt hip pain to allow her to sleep per her previous levle ( 11/14/17)     Time  4    Period  Weeks    Status  New      PT LONG TERM GOAL #3   Title  improve FOTO =/< 50% limited (11/14/17)    Time  4    Period  Weeks    Status  New      PT LONG TERM GOAL #4   Title  increase strength Lt LE =/> 4/5 through out ( 11/14/17)     Time  4    Period  Weeks    Status  New             Plan - 10/17/17 0845    Clinical Impression Statement  67 yo female with multiple medical problems.  She has been having conintued back issues since her lumbar fusion 2-3 yrs ago and the pain is into her Lt LE as well.  Pt is very limited financially and doesn't feel like she will be able to attend treatment due to the cost.  She is seeing her neurosurgeon next month to discuss another surgery.  She has weakness and tightness in the Lt LE along with pain that limits her walking, and tolerance to activity.   (Pended)     History and Personal Factors relevant to plan of care:  DM, lumbar fusion, financial restraints, obesity, abnormal posture.   (Pended)     Clinical Presentation  Evolving  (Pended)     Clinical Decision Making  Moderate  (Pended)     Rehab Potential  Poor  (Pended)  as patient doesn't think she can attend due to finances    PT Frequency  Biweekly  (Pended)     PT Duration  4 weeks  (Pended)     PT Treatment/Interventions  Gait training;Functional mobility training;Moist Heat;Ultrasound;Therapeutic activities;Patient/family education;Manual  techniques;Therapeutic exercise;Cryotherapy;Electrical Stimulation  (Pended)     PT Next Visit Plan  if patient returns modify/progress HEP  (Pended)     Consulted and Agree with Plan of Care  Patient  (Pended)  Patient will benefit from skilled therapeutic intervention in order to improve the following deficits and impairments:  (P) Abnormal gait, Pain, Improper body mechanics, Postural dysfunction, Decreased strength, Decreased range of motion, Difficulty walking, Impaired flexibility  Visit Diagnosis: Pain in left hip - Plan: PT plan of care cert/re-cert  Other abnormalities of gait and mobility - Plan: PT plan of care cert/re-cert  Muscle weakness (generalized) - Plan: PT plan of care cert/re-cert     Problem List Patient Active Problem List   Diagnosis Date Noted  . Fatty liver 09/27/2016  . Pendulous breast 08/12/2015  . Spondylolisthesis of lumbar region 06/13/2015  . Spondylolisthesis at L4-L5 level 04/07/2015  . Radiculopathy, lumbar region 04/07/2015  . Essential hypertension 02/14/2015  . Postprandial vomiting 02/04/2014  . Obesity (BMI 30-39.9) 11/01/2013  . Carotid artery stenosis 06/07/2013  . Unspecified hereditary and idiopathic peripheral neuropathy 06/07/2013  . History of migraine 05/04/2013  . Diabetes mellitus without complication ([place]) 09/81/1914  . SVT (supraventricular tachycardia) ([place]) 11/03/2010  . GERD (gastroesophageal reflux disease) 10/29/2010  . Chronic low back pain 10/29/2010  . Other and unspecified hyperlipidemia 10/29/2010    Jeral Pinch PT  10/17/2017, 9:34 AM  [facility] [place], [place], [address] Phone: [phone]   Fax:  [phone]  Name: Amanda Morris MRN: [id number] Date of Birth: 03-24-1951  PHYSICAL THERAPY DISCHARGE SUMMARY  Visits from Start of Care: Evaluation only   Current functional level related to goals / functional  outcomes: See evaluation only   Remaining deficits: Unknown    Education / Equipment: Initial HEP  Plan: Patient agrees to discharge.  Patient goals were not met. Patient is being discharged due to not returning since the last visit.  ?????    Celyn P. Helene Kelp PT, MPH 11/24/17 3:56 PM

## 2017-10-17 NOTE — Patient Instructions (Addendum)
Pelvic Press    Place hands under belly between navel and pubic bone, palms up. Feel pressure on hands. Increase pressure on hands by pressing pelvis down. This is NOT a pelvic tilt. Hold _5__ seconds. Relax. Repeat _10__ times. Once a day.   Quads / HF, Prone    Lie face down, knees together. Grasp one ankle with same-side hand. Use towel if needed to reach. Gently pull foot toward buttock. Hold _30-45__ seconds. Repeat _2__ times per session. Do __1_ sessions per day.  On Elbows (Prone)    Rise up on elbows as high as possible, keeping hips on floor. Hold __30__ seconds. Repeat __2__ times per set. Do ___1_ sets per session. Do __2__ sessions per day.  Supine: Leg Stretch with Strap (Super Advanced)    Lie on back with one leg straight. Hook strap around other foot. Straighten knee. Raise leg to maximal stretch and straighten knee further by tightening quadriceps. Slowly press other leg down as close to floor as possible. Keep lower abdominals tight. Hold _30-45__ seconds. Warning: Intense stretch. Stay within tolerance. Repeat 2___ times per session. Do _1 sessions per day.  Outer Hip Stretch: Reclined IT Band Stretch (Strap) - if muscle pain cont with the stretch , if it becomes nerve pain stop,     Strap around opposite foot, pull across only as far as possible with shoulders on mat. Hold for __30-45__sec. Repeat __2__ times each leg.

## 2017-10-17 NOTE — Progress Notes (Signed)
Subjective:    CC:   HPI: Diabetes - no hypoglycemic events. No wounds or sores that are not healing well. No increased thirst or urination. Not hecking glucose at home. Taking medications as prescribed without any side effects.  He has been getting into sweets a little bit more and would like a new prescription for glucometer.  She thinks her old one is broken.  Hypertension- Pt denies chest pain, SOB, dizziness, or heart palpitations.  Taking meds as directed w/o problems.  Denies medication side effects.    Idiopathic peripheral neuropathy-she would like a refill on her gabapentin today.   Past medical history, Surgical history, Family history not pertinant except as noted below, Social history, Allergies, and medications have been entered into the medical record, reviewed, and corrections made.   Review of Systems: No fevers, chills, night sweats, weight loss, chest pain, or shortness of breath.   Objective:    General: Well Developed, well nourished, and in no acute distress.  Neuro: Alert and oriented x3, extra-ocular muscles intact, sensation grossly intact.  HEENT: Normocephalic, atraumatic  Skin: Warm and dry, no rashes. Cardiac: Regular rate and rhythm, no murmurs rubs or gallops, no lower extremity edema.  Respiratory: Clear to auscultation bilaterally. Not using accessory muscles, speaking in full sentences.   Impression and Recommendations:    DM -well controlled.  Hemoglobin A1c 6.8 today. Work on cutting back on sweets.   HTN - Well controlled. Continue current regimen. Follow up in  6 months.   Idiopathic peripheral neuropathy-gabapentin refilled.

## 2017-11-03 DIAGNOSIS — M961 Postlaminectomy syndrome, not elsewhere classified: Secondary | ICD-10-CM | POA: Diagnosis not present

## 2017-11-07 ENCOUNTER — Other Ambulatory Visit: Payer: Self-pay | Admitting: Neurosurgery

## 2017-11-18 NOTE — Pre-Procedure Instructions (Signed)
Amanda Morris  11/18/2017      [id number] - [address] [place] [address] Phone: [phone] Fax: [phone]    Your procedure is scheduled on Friday August 2.  Report to [facility] Admitting at 7:00 A.M.  Call this number if you have problems the morning of surgery:  [phone]   Remember:  Do not eat or drink after midnight.    Take these medicines the morning of surgery with A SIP OF WATER:   Dexilant Gabapentin (neurontin) Bentyl (dicyclomine)  DO NOT TAKE Jardiance or glipizide (glucotrol) the day of surgery  Take half dose of insulin glargine (Lantus) the night before surgery (19 units)  7 days prior to surgery STOP taking any Aspirin(unless otherwise instructed by your surgeon), Aleve, Naproxen, Ibuprofen, Motrin, Advil, Goody's, BC's, all herbal medications, fish oil, and all vitamins     How to Manage Your Diabetes Before and After Surgery  Why is it important to control my blood sugar before and after surgery? . Improving blood sugar levels before and after surgery helps healing and can limit problems. . A way of improving blood sugar control is eating a healthy diet by: o  Eating less sugar and carbohydrates o  Increasing activity/exercise o  Talking with your doctor about reaching your blood sugar goals . High blood sugars (greater than 180 mg/dL) can raise your risk of infections and slow your recovery, so you will need to focus on controlling your diabetes during the weeks before surgery. . Make sure that the doctor who takes care of your diabetes knows about your planned surgery including the date and location.  How do I manage my blood sugar before surgery? . Check your blood sugar at least 4 times a day, starting 2 days before surgery, to make sure that the level is not too high or low. o Check your blood sugar the morning of your  surgery when you wake up and every 2 hours until you get to the Short Stay unit. . If your blood sugar is less than 70 mg/dL, you will need to treat for low blood sugar: o Do not take insulin. o Treat a low blood sugar (less than 70 mg/dL) with  cup of clear juice (cranberry or apple), 4 glucose tablets, OR glucose gel. Recheck blood sugar in 15 minutes after treatment (to make sure it is greater than 70 mg/dL). If your blood sugar is not greater than 70 mg/dL on recheck, call [phone] o  for further instructions. . Report your blood sugar to the short stay nurse when you get to Short Stay.  . If you are admitted to the hospital after surgery: o Your blood sugar will be checked by the staff and you will probably be given insulin after surgery (instead of oral diabetes medicines) to make sure you have good blood sugar levels. o The goal for blood sugar control after surgery is 80-180 mg/dL.              Do not wear jewelry, make-up or nail polish.  Do not wear lotions, powders, or perfumes, or deodorant.  Do not shave 48 hours prior to surgery.  Men may shave face and neck.  Do not bring valuables to the hospital.  [facility] is not responsible for any belongings or valuables.  Contacts, dentures or bridgework may not be worn into surgery.  Leave your suitcase in the car.  After surgery it may be brought to your room.  For patients admitted to the hospital, discharge time will be determined by your treatment team.  Patients discharged the day of surgery will not be allowed to drive home.   Special instructions:    [facility]- Preparing For Surgery  Before surgery, you can play an important role. Because skin is not sterile, your skin needs to be as free of germs as possible. You can reduce the number of germs on your skin by washing with CHG (chlorahexidine gluconate) Soap before surgery.  CHG is an antiseptic cleaner which kills germs and bonds with the skin to continue  killing germs even after washing.    Oral Hygiene is also important to reduce your risk of infection.  Remember - BRUSH YOUR TEETH THE MORNING OF SURGERY WITH YOUR REGULAR TOOTHPASTE  Please do not use if you have an allergy to CHG or antibacterial soaps. If your skin becomes reddened/irritated stop using the CHG.  Do not shave (including legs and underarms) for at least 48 hours prior to first CHG shower. It is OK to shave your face.  Please follow these instructions carefully.   1. Shower the NIGHT BEFORE SURGERY and the MORNING OF SURGERY with CHG.   2. If you chose to wash your hair, wash your hair first as usual with your normal shampoo.  3. After you shampoo, rinse your hair and body thoroughly to remove the shampoo.  4. Use CHG as you would any other liquid soap. You can apply CHG directly to the skin and wash gently with a scrungie or a clean washcloth.   5. Apply the CHG Soap to your body ONLY FROM THE NECK DOWN.  Do not use on open wounds or open sores. Avoid contact with your eyes, ears, mouth and genitals (private parts). Wash Face and genitals (private parts)  with your normal soap.  6. Wash thoroughly, paying special attention to the area where your surgery will be performed.  7. Thoroughly rinse your body with warm water from the neck down.  8. DO NOT shower/wash with your normal soap after using and rinsing off the CHG Soap.  9. Pat yourself dry with a CLEAN TOWEL.  10. Wear CLEAN PAJAMAS to bed the night before surgery, wear comfortable clothes the morning of surgery  11. Place CLEAN SHEETS on your bed the night of your first shower and DO NOT SLEEP WITH PETS.    Day of Surgery:  Do not apply any deodorants/lotions.  Please wear clean clothes to the hospital/surgery center.   Remember to brush your teeth WITH YOUR REGULAR TOOTHPASTE.    Please read over the following fact sheets that you were given. Coughing and Deep Breathing, MRSA Information and Surgical  Site Infection Prevention

## 2017-11-21 ENCOUNTER — Encounter (HOSPITAL_COMMUNITY)
Admission: RE | Admit: 2017-11-21 | Discharge: 2017-11-21 | Disposition: A | Discharge status: Home / Self Care | Payer: Medicare Other | Source: Ambulatory Visit | Attending: Neurosurgery | Admitting: Neurosurgery

## 2017-11-21 ENCOUNTER — Other Ambulatory Visit: Payer: Self-pay

## 2017-11-21 ENCOUNTER — Encounter (HOSPITAL_COMMUNITY): Payer: Self-pay

## 2017-11-21 DIAGNOSIS — M961 Postlaminectomy syndrome, not elsewhere classified: Secondary | ICD-10-CM | POA: Insufficient documentation

## 2017-11-21 DIAGNOSIS — Z01812 Encounter for preprocedural laboratory examination: Secondary | ICD-10-CM | POA: Diagnosis present

## 2017-11-21 DIAGNOSIS — R9431 Abnormal electrocardiogram [ECG] [EKG]: Secondary | ICD-10-CM | POA: Insufficient documentation

## 2017-11-21 DIAGNOSIS — Z01810 Encounter for preprocedural cardiovascular examination: Secondary | ICD-10-CM | POA: Diagnosis present

## 2017-11-21 HISTORY — DX: Pure hypercholesterolemia, unspecified: E78.00

## 2017-11-21 LAB — CBC
HCT: 42.9 % (ref 36.0–46.0)
Hemoglobin: 12.8 g/dL (ref 12.0–15.0)
MCH: 24.2 pg — ABNORMAL LOW (ref 26.0–34.0)
MCHC: 29.8 g/dL — ABNORMAL LOW (ref 30.0–36.0)
MCV: 80.9 fL (ref 78.0–100.0)
Platelets: 273 10*3/uL (ref 150–400)
RBC: 5.3 MIL/uL — ABNORMAL HIGH (ref 3.87–5.11)
RDW: 15.9 % — ABNORMAL HIGH (ref 11.5–15.5)
WBC: 9.7 10*3/uL (ref 4.0–10.5)

## 2017-11-21 LAB — BASIC METABOLIC PANEL
Anion gap: 12 (ref 5–15)
BUN: 14 mg/dL (ref 8–23)
CO2: 23 mmol/L (ref 22–32)
Calcium: 8.7 mg/dL — ABNORMAL LOW (ref 8.9–10.3)
Chloride: 108 mmol/L (ref 98–111)
Creatinine, Ser: 0.78 mg/dL (ref 0.44–1.00)
GFR calc Af Amer: >60 mL/min (ref >60)
GFR calc non Af Amer: >60 mL/min (ref >60)
Glucose, Bld: 131 mg/dL — ABNORMAL HIGH (ref 70–99)
Potassium: 4 mmol/L (ref 3.5–5.1)
Sodium: 143 mmol/L (ref 135–145)

## 2017-11-21 LAB — SURGICAL PCR SCREEN
MRSA, PCR: NEGATIVE
Staphylococcus aureus: NEGATIVE

## 2017-11-21 LAB — GLUCOSE, CAPILLARY: Glucose-Capillary: 128 mg/dL — ABNORMAL HIGH (ref 70–99)

## 2017-11-21 LAB — HEMOGLOBIN A1C
Hgb A1c MFr Bld: 7.3 % — ABNORMAL HIGH (ref 4.8–5.6)
Mean Plasma Glucose: 162.81 mg/dL

## 2017-11-21 MED ORDER — CHLORHEXIDINE GLUCONATE CLOTH 2 % EX PADS: 6.0000 | MEDICATED_PAD | Freq: Once | CUTANEOUS | Status: DC

## 2017-11-21 NOTE — Progress Notes (Signed)
PCP: Beatrice Lecher, MD  Cardiologist:  Pt denies  EKG: obtained today-denies past year  Stress test: pt denies  ECHO: pt denies  Cardiac Cath: pt denies  Chest x-ray: pt denies past year, no recent respiratory infections   Pt instructed to begin holding Aspirin yesterday 11/20/17 until after surgery per surgeon's instructions

## 2017-11-24 MED ORDER — VANCOMYCIN HCL 10 G IV SOLR
1500.0000 mg | INTRAVENOUS | Status: DC
  Filled 2017-11-24: qty 1500

## 2017-11-24 MED ORDER — VANCOMYCIN HCL 10 G IV SOLR
1500.0000 mg | INTRAVENOUS | Status: AC
  Administered 2017-11-25: 1500 mg via INTRAVENOUS
  Filled 2017-11-24: qty 1500

## 2017-11-25 ENCOUNTER — Other Ambulatory Visit: Payer: Self-pay

## 2017-11-25 ENCOUNTER — Ambulatory Visit (HOSPITAL_COMMUNITY): Payer: Medicare Other | Admitting: Anesthesiology

## 2017-11-25 ENCOUNTER — Other Ambulatory Visit: Payer: Self-pay | Admitting: Neurosurgery

## 2017-11-25 ENCOUNTER — Observation Stay (HOSPITAL_COMMUNITY)
Admission: RE | Admit: 2017-11-25 | Discharge: 2017-11-26 | Disposition: A | Discharge status: Home / Self Care | Payer: Medicare Other | Source: Ambulatory Visit | Attending: Neurosurgery | Admitting: Neurosurgery

## 2017-11-25 ENCOUNTER — Encounter (HOSPITAL_COMMUNITY)
Admission: RE | Disposition: A | Discharge status: Home / Self Care | Payer: Self-pay | Source: Ambulatory Visit | Attending: Neurosurgery

## 2017-11-25 ENCOUNTER — Encounter (HOSPITAL_COMMUNITY): Payer: Self-pay | Admitting: *Deleted

## 2017-11-25 ENCOUNTER — Ambulatory Visit (HOSPITAL_COMMUNITY): Payer: Medicare Other

## 2017-11-25 DIAGNOSIS — E7800 Pure hypercholesterolemia, unspecified: Secondary | ICD-10-CM | POA: Insufficient documentation

## 2017-11-25 DIAGNOSIS — Z888 Allergy status to other drugs, medicaments and biological substances status: Secondary | ICD-10-CM | POA: Insufficient documentation

## 2017-11-25 DIAGNOSIS — Z79899 Other long term (current) drug therapy: Secondary | ICD-10-CM | POA: Diagnosis not present

## 2017-11-25 DIAGNOSIS — Z886 Allergy status to analgesic agent status: Secondary | ICD-10-CM | POA: Diagnosis not present

## 2017-11-25 DIAGNOSIS — E785 Hyperlipidemia, unspecified: Secondary | ICD-10-CM | POA: Diagnosis not present

## 2017-11-25 DIAGNOSIS — Z87891 Personal history of nicotine dependence: Secondary | ICD-10-CM | POA: Insufficient documentation

## 2017-11-25 DIAGNOSIS — Z880 Allergy status to penicillin: Secondary | ICD-10-CM | POA: Insufficient documentation

## 2017-11-25 DIAGNOSIS — Z8249 Family history of ischemic heart disease and other diseases of the circulatory system: Secondary | ICD-10-CM | POA: Diagnosis not present

## 2017-11-25 DIAGNOSIS — Z969 Presence of functional implant, unspecified: Secondary | ICD-10-CM | POA: Diagnosis not present

## 2017-11-25 DIAGNOSIS — E1151 Type 2 diabetes mellitus with diabetic peripheral angiopathy without gangrene: Secondary | ICD-10-CM | POA: Diagnosis not present

## 2017-11-25 DIAGNOSIS — M961 Postlaminectomy syndrome, not elsewhere classified: Secondary | ICD-10-CM | POA: Diagnosis present

## 2017-11-25 DIAGNOSIS — Z885 Allergy status to narcotic agent status: Secondary | ICD-10-CM | POA: Insufficient documentation

## 2017-11-25 DIAGNOSIS — Z419 Encounter for procedure for purposes other than remedying health state, unspecified: Secondary | ICD-10-CM

## 2017-11-25 DIAGNOSIS — I10 Essential (primary) hypertension: Secondary | ICD-10-CM | POA: Diagnosis not present

## 2017-11-25 DIAGNOSIS — Z6838 Body mass index (BMI) 38.0-38.9, adult: Secondary | ICD-10-CM | POA: Diagnosis not present

## 2017-11-25 DIAGNOSIS — E119 Type 2 diabetes mellitus without complications: Secondary | ICD-10-CM | POA: Diagnosis not present

## 2017-11-25 DIAGNOSIS — Z794 Long term (current) use of insulin: Secondary | ICD-10-CM | POA: Insufficient documentation

## 2017-11-25 DIAGNOSIS — Z7982 Long term (current) use of aspirin: Secondary | ICD-10-CM | POA: Insufficient documentation

## 2017-11-25 HISTORY — PX: SPINAL CORD STIMULATOR TRIAL: SHX5380

## 2017-11-25 LAB — GLUCOSE, CAPILLARY
Glucose-Capillary: 121 mg/dL — ABNORMAL HIGH (ref 70–99)
Glucose-Capillary: 100 mg/dL — ABNORMAL HIGH (ref 70–99)
Glucose-Capillary: 113 mg/dL — ABNORMAL HIGH (ref 70–99)
Glucose-Capillary: 138 mg/dL — ABNORMAL HIGH (ref 70–99)

## 2017-11-25 SURGERY — LUMBAR SPINAL CORD STIMULATOR TRIAL
Anesthesia: General

## 2017-11-25 MED ORDER — FENTANYL CITRATE (PF) 100 MCG/2ML IJ SOLN
25.0000 ug | INTRAMUSCULAR | Status: DC | PRN
  Administered 2017-11-25 (×2): 50 ug via INTRAVENOUS

## 2017-11-25 MED ORDER — THROMBIN 5000 UNITS EX SOLR
CUTANEOUS | Status: AC
  Filled 2017-11-25: qty 5000

## 2017-11-25 MED ORDER — ZOLPIDEM TARTRATE 5 MG PO TABS: 5.0000 mg | ORAL_TABLET | Freq: Every evening | ORAL | Status: DC | PRN

## 2017-11-25 MED ORDER — DOXEPIN HCL 10 MG PO CAPS
10.0000 mg | ORAL_CAPSULE | Freq: Every day | ORAL | Status: DC
  Administered 2017-11-25: 10 mg via ORAL
  Filled 2017-11-25: qty 1

## 2017-11-25 MED ORDER — POTASSIUM CHLORIDE IN NACL 20-0.9 MEQ/L-% IV SOLN
INTRAVENOUS | Status: DC
  Administered 2017-11-25: 13:00:00 via INTRAVENOUS

## 2017-11-25 MED ORDER — SODIUM CHLORIDE 0.9 % IV SOLN: 250.0000 mL | INTRAVENOUS | Status: DC

## 2017-11-25 MED ORDER — MIDAZOLAM HCL 2 MG/2ML IJ SOLN
INTRAMUSCULAR | Status: AC
  Filled 2017-11-25: qty 2

## 2017-11-25 MED ORDER — PROMETHAZINE HCL 25 MG/ML IJ SOLN
6.2500 mg | INTRAMUSCULAR | Status: DC | PRN
  Administered 2017-11-25: 6.25 mg via INTRAVENOUS

## 2017-11-25 MED ORDER — LACTATED RINGERS IV SOLN
INTRAVENOUS | Status: DC | PRN
  Administered 2017-11-25: 08:00:00 via INTRAVENOUS

## 2017-11-25 MED ORDER — PROPOFOL 10 MG/ML IV BOLUS
INTRAVENOUS | Status: DC | PRN
  Administered 2017-11-25: 100 mg via INTRAVENOUS

## 2017-11-25 MED ORDER — FENTANYL CITRATE (PF) 100 MCG/2ML IJ SOLN
INTRAMUSCULAR | Status: AC
  Filled 2017-11-25: qty 2

## 2017-11-25 MED ORDER — THROMBIN 5000 UNITS EX SOLR
CUTANEOUS | Status: DC | PRN
  Administered 2017-11-25: 10000 [IU] via TOPICAL

## 2017-11-25 MED ORDER — PROPOFOL 10 MG/ML IV BOLUS
INTRAVENOUS | Status: AC
  Filled 2017-11-25: qty 20

## 2017-11-25 MED ORDER — LIDOCAINE HCL (CARDIAC) PF 100 MG/5ML IV SOSY
PREFILLED_SYRINGE | INTRAVENOUS | Status: DC | PRN
  Administered 2017-11-25: 30 mg via INTRAVENOUS

## 2017-11-25 MED ORDER — LEVOCETIRIZINE DIHYDROCHLORIDE 5 MG PO TABS: 5.0000 mg | ORAL_TABLET | Freq: Every evening | ORAL | Status: DC

## 2017-11-25 MED ORDER — 0.9 % SODIUM CHLORIDE (POUR BTL) OPTIME
TOPICAL | Status: DC | PRN
  Administered 2017-11-25: 1000 mL

## 2017-11-25 MED ORDER — CANAGLIFLOZIN 100 MG PO TABS
100.0000 mg | ORAL_TABLET | Freq: Every day | ORAL | Status: DC
  Administered 2017-11-26: 100 mg via ORAL
  Filled 2017-11-25: qty 1

## 2017-11-25 MED ORDER — KETOROLAC TROMETHAMINE 15 MG/ML IJ SOLN
INTRAMUSCULAR | Status: AC
  Filled 2017-11-25: qty 1

## 2017-11-25 MED ORDER — ASPIRIN EC 81 MG PO TBEC
81.0000 mg | DELAYED_RELEASE_TABLET | Freq: Every day | ORAL | Status: DC
  Administered 2017-11-26: 81 mg via ORAL
  Filled 2017-11-25: qty 1

## 2017-11-25 MED ORDER — CELECOXIB 200 MG PO CAPS
200.0000 mg | ORAL_CAPSULE | Freq: Two times a day (BID) | ORAL | Status: DC
  Administered 2017-11-25 – 2017-11-26 (×2): 200 mg via ORAL
  Filled 2017-11-25 (×2): qty 1

## 2017-11-25 MED ORDER — GABAPENTIN 300 MG PO CAPS
300.0000 mg | ORAL_CAPSULE | Freq: Three times a day (TID) | ORAL | Status: DC
  Administered 2017-11-25 – 2017-11-26 (×3): 300 mg via ORAL
  Filled 2017-11-25 (×3): qty 1

## 2017-11-25 MED ORDER — SUGAMMADEX SODIUM 200 MG/2ML IV SOLN
INTRAVENOUS | Status: DC | PRN
  Administered 2017-11-25: 204.2 mg via INTRAVENOUS

## 2017-11-25 MED ORDER — ONDANSETRON HCL 4 MG PO TABS: 4.0000 mg | ORAL_TABLET | Freq: Four times a day (QID) | ORAL | Status: DC | PRN

## 2017-11-25 MED ORDER — PERPHENAZINE 8 MG PO TABS
8.0000 mg | ORAL_TABLET | Freq: Every day | ORAL | Status: DC
  Administered 2017-11-25: 8 mg via ORAL
  Filled 2017-11-25: qty 1

## 2017-11-25 MED ORDER — BACITRACIN ZINC 500 UNIT/GM EX OINT
TOPICAL_OINTMENT | CUTANEOUS | Status: DC | PRN
  Administered 2017-11-25: 1 via TOPICAL

## 2017-11-25 MED ORDER — CETIRIZINE HCL 10 MG PO TABS
10.0000 mg | ORAL_TABLET | Freq: Every day | ORAL | Status: DC
  Filled 2017-11-25 (×2): qty 1

## 2017-11-25 MED ORDER — ROCURONIUM BROMIDE 100 MG/10ML IV SOLN
INTRAVENOUS | Status: DC | PRN
  Administered 2017-11-25: 50 mg via INTRAVENOUS

## 2017-11-25 MED ORDER — LACTATED RINGERS IV SOLN
INTRAVENOUS | Status: DC
  Administered 2017-11-25: 10 mL/h via INTRAVENOUS

## 2017-11-25 MED ORDER — LIDOCAINE-EPINEPHRINE 0.5 %-1:200000 IJ SOLN
INTRAMUSCULAR | Status: AC
  Filled 2017-11-25: qty 1

## 2017-11-25 MED ORDER — ACETAMINOPHEN 650 MG RE SUPP: 650.0000 mg | RECTAL | Status: DC | PRN

## 2017-11-25 MED ORDER — BACITRACIN ZINC 500 UNIT/GM EX OINT
TOPICAL_OINTMENT | CUTANEOUS | Status: AC
  Filled 2017-11-25: qty 28.35

## 2017-11-25 MED ORDER — ONDANSETRON HCL 4 MG/2ML IJ SOLN: 4.0000 mg | Freq: Four times a day (QID) | INTRAMUSCULAR | Status: DC | PRN

## 2017-11-25 MED ORDER — FENTANYL CITRATE (PF) 100 MCG/2ML IJ SOLN
INTRAMUSCULAR | Status: DC | PRN
  Administered 2017-11-25 (×2): 50 ug via INTRAVENOUS

## 2017-11-25 MED ORDER — INSULIN GLARGINE 100 UNIT/ML SOLOSTAR PEN: 30.0000 [IU] | PEN_INJECTOR | Freq: Every day | SUBCUTANEOUS | Status: DC

## 2017-11-25 MED ORDER — ONDANSETRON HCL 4 MG/2ML IJ SOLN
INTRAMUSCULAR | Status: DC | PRN
  Administered 2017-11-25: 4 mg via INTRAVENOUS

## 2017-11-25 MED ORDER — DICYCLOMINE HCL 10 MG PO CAPS
10.0000 mg | ORAL_CAPSULE | Freq: Four times a day (QID) | ORAL | Status: DC
  Administered 2017-11-25 – 2017-11-26 (×4): 10 mg via ORAL
  Filled 2017-11-25 (×4): qty 1

## 2017-11-25 MED ORDER — PROMETHAZINE HCL 25 MG/ML IJ SOLN
INTRAMUSCULAR | Status: AC
  Filled 2017-11-25: qty 1

## 2017-11-25 MED ORDER — ACETAMINOPHEN 500 MG PO TABS
1000.0000 mg | ORAL_TABLET | Freq: Four times a day (QID) | ORAL | Status: DC
  Administered 2017-11-25 – 2017-11-26 (×3): 1000 mg via ORAL
  Filled 2017-11-25 (×3): qty 2

## 2017-11-25 MED ORDER — VITAMIN D 1000 UNITS PO TABS
1000.0000 [IU] | ORAL_TABLET | Freq: Every day | ORAL | Status: DC
  Administered 2017-11-26: 1000 [IU] via ORAL
  Filled 2017-11-25: qty 1

## 2017-11-25 MED ORDER — MENTHOL 3 MG MT LOZG: 1.0000 | LOZENGE | OROMUCOSAL | Status: DC | PRN

## 2017-11-25 MED ORDER — SODIUM CHLORIDE 0.9% FLUSH: 3.0000 mL | INTRAVENOUS | Status: DC | PRN

## 2017-11-25 MED ORDER — PANTOPRAZOLE SODIUM 40 MG PO TBEC
40.0000 mg | DELAYED_RELEASE_TABLET | Freq: Every day | ORAL | Status: DC
  Administered 2017-11-26: 40 mg via ORAL
  Filled 2017-11-25: qty 1

## 2017-11-25 MED ORDER — GLIPIZIDE 10 MG PO TABS
10.0000 mg | ORAL_TABLET | Freq: Two times a day (BID) | ORAL | Status: DC
  Administered 2017-11-26: 10 mg via ORAL
  Filled 2017-11-25 (×3): qty 1

## 2017-11-25 MED ORDER — KETOROLAC TROMETHAMINE 15 MG/ML IJ SOLN
7.5000 mg | Freq: Four times a day (QID) | INTRAMUSCULAR | Status: AC
  Administered 2017-11-25 (×3): 7.5 mg via INTRAVENOUS
  Filled 2017-11-25 (×2): qty 1

## 2017-11-25 MED ORDER — SODIUM CHLORIDE 0.9% FLUSH
3.0000 mL | Freq: Two times a day (BID) | INTRAVENOUS | Status: DC
  Administered 2017-11-25: 3 mL via INTRAVENOUS

## 2017-11-25 MED ORDER — FENTANYL CITRATE (PF) 250 MCG/5ML IJ SOLN
INTRAMUSCULAR | Status: AC
  Filled 2017-11-25: qty 5

## 2017-11-25 MED ORDER — KETOROLAC TROMETHAMINE 30 MG/ML IJ SOLN: 30.0000 mg | Freq: Once | INTRAMUSCULAR | Status: DC | PRN

## 2017-11-25 MED ORDER — INSULIN GLARGINE 100 UNIT/ML ~~LOC~~ SOLN
30.0000 [IU] | Freq: Every day | SUBCUTANEOUS | Status: DC
  Administered 2017-11-25: 30 [IU] via SUBCUTANEOUS
  Filled 2017-11-25: qty 0.3

## 2017-11-25 MED ORDER — MULTIVITAMIN WOMEN 50+ PO TABS: 1.0000 | ORAL_TABLET | Freq: Every day | ORAL | Status: DC

## 2017-11-25 MED ORDER — PHENYLEPHRINE 40 MCG/ML (10ML) SYRINGE FOR IV PUSH (FOR BLOOD PRESSURE SUPPORT)
PREFILLED_SYRINGE | INTRAVENOUS | Status: AC
  Filled 2017-11-25: qty 10

## 2017-11-25 MED ORDER — FAMOTIDINE 20 MG PO TABS
40.0000 mg | ORAL_TABLET | Freq: Every evening | ORAL | Status: DC
  Administered 2017-11-25: 40 mg via ORAL
  Filled 2017-11-25: qty 2

## 2017-11-25 MED ORDER — LIDOCAINE-EPINEPHRINE 0.5 %-1:200000 IJ SOLN
INTRAMUSCULAR | Status: DC | PRN
  Administered 2017-11-25: 10 mL

## 2017-11-25 MED ORDER — PHENOL 1.4 % MT LIQD: 1.0000 | OROMUCOSAL | Status: DC | PRN

## 2017-11-25 MED ORDER — ADULT MULTIVITAMIN W/MINERALS CH
1.0000 | ORAL_TABLET | Freq: Every day | ORAL | Status: DC
  Administered 2017-11-26: 1 via ORAL
  Filled 2017-11-25: qty 1

## 2017-11-25 MED ORDER — HEMOSTATIC AGENTS (NO CHARGE) OPTIME
TOPICAL | Status: DC | PRN
  Administered 2017-11-25: 1 via TOPICAL

## 2017-11-25 MED ORDER — FLUTICASONE PROPIONATE 50 MCG/ACT NA SUSP
1.0000 | Freq: Every day | NASAL | Status: DC
  Filled 2017-11-25: qty 16

## 2017-11-25 MED ORDER — PRAVASTATIN SODIUM 40 MG PO TABS
40.0000 mg | ORAL_TABLET | Freq: Every day | ORAL | Status: DC
  Administered 2017-11-25: 40 mg via ORAL
  Filled 2017-11-25: qty 1

## 2017-11-25 MED ORDER — ACETAMINOPHEN 325 MG PO TABS: 650.0000 mg | ORAL_TABLET | ORAL | Status: DC | PRN

## 2017-11-25 SURGICAL SUPPLY — 55 items
BLADE CLIPPER SURG (BLADE) IMPLANT
BUR MATCHSTICK NEURO 3.0 LAGG (BURR) ×2 IMPLANT
CABLE OR STIMULATOR 2X8 61 (WIRE) ×4 IMPLANT
CARTRIDGE OIL MAESTRO DRILL (MISCELLANEOUS) ×1 IMPLANT
DECANTER SPIKE VIAL GLASS SM (MISCELLANEOUS) ×2 IMPLANT
DERMABOND ADVANCED (GAUZE/BANDAGES/DRESSINGS) ×1
DERMABOND ADVANCED .7 DNX12 (GAUZE/BANDAGES/DRESSINGS) ×1 IMPLANT
DIFFUSER DRILL AIR PNEUMATIC (MISCELLANEOUS) ×2 IMPLANT
DRAPE C-ARM 42X72 X-RAY (DRAPES) ×2 IMPLANT
DRAPE CAMERA VIDEO/LASER (DRAPES) ×2 IMPLANT
DRAPE ORTHO SPLIT 77X108 STRL (DRAPES) ×2
DRAPE POUCH INSTRU U-SHP 10X18 (DRAPES) ×2 IMPLANT
DRAPE SURG ORHT 6 SPLT 77X108 (DRAPES) ×2 IMPLANT
DRSG OPSITE 4X5.5 SM (GAUZE/BANDAGES/DRESSINGS) IMPLANT
DRSG OPSITE POSTOP 4X6 (GAUZE/BANDAGES/DRESSINGS) ×2 IMPLANT
DRSG TEGADERM 2-3/8X2-3/4 SM (GAUZE/BANDAGES/DRESSINGS) IMPLANT
DRSG TEGADERM 4X4.75 (GAUZE/BANDAGES/DRESSINGS) ×2 IMPLANT
DURAPREP 26ML APPLICATOR (WOUND CARE) ×2 IMPLANT
ELEVATER PASSER (SPINAL CORD STIMULATOR) ×2
EXTENSION 35CM (Spinal Cord Stimulator) ×8 IMPLANT
GAUZE SPONGE 4X4 12PLY STRL (GAUZE/BANDAGES/DRESSINGS) ×2 IMPLANT
GLOVE ECLIPSE 6.5 STRL STRAW (GLOVE) ×2 IMPLANT
GLOVE EXAM NITRILE LRG STRL (GLOVE) IMPLANT
GLOVE EXAM NITRILE XL STR (GLOVE) IMPLANT
GLOVE EXAM NITRILE XS STR PU (GLOVE) IMPLANT
GOWN STRL REUS W/ TWL LRG LVL3 (GOWN DISPOSABLE) ×2 IMPLANT
GOWN STRL REUS W/ TWL XL LVL3 (GOWN DISPOSABLE) IMPLANT
GOWN STRL REUS W/TWL 2XL LVL3 (GOWN DISPOSABLE) IMPLANT
GOWN STRL REUS W/TWL LRG LVL3 (GOWN DISPOSABLE) ×2
GOWN STRL REUS W/TWL XL LVL3 (GOWN DISPOSABLE)
KIT BASIN OR (CUSTOM PROCEDURE TRAY) ×2 IMPLANT
KIT TURNOVER KIT B (KITS) ×2 IMPLANT
KT PT TRIAL SPINAL STIMULATOR (KITS) ×2
LEAD COVER EDGE 50CM STIM KIT (Lead) ×2 IMPLANT
NEEDLE HYPO 25X1 1.5 SAFETY (NEEDLE) ×2 IMPLANT
NEEDLE SPNL 22GX3.5 QUINCKE BK (NEEDLE) IMPLANT
NS IRRIG 1000ML POUR BTL (IV SOLUTION) ×2 IMPLANT
OIL CARTRIDGE MAESTRO DRILL (MISCELLANEOUS) ×2
PACK LAMINECTOMY NEURO (CUSTOM PROCEDURE TRAY) ×2 IMPLANT
PAD ARMBOARD 7.5X6 YLW CONV (MISCELLANEOUS) ×6 IMPLANT
PASSER ELEVATOR (SPINAL CORD STIMULATOR) ×1 IMPLANT
SPONGE SURGIFOAM ABS GEL SZ50 (HEMOSTASIS) ×2 IMPLANT
STAPLER SKIN PROX WIDE 3.9 (STAPLE) ×2 IMPLANT
STIMULATOR SPINAL KT PT TRIAL (KITS) ×1 IMPLANT
SUT SILK 2 0 TIES 10X30 (SUTURE) ×2 IMPLANT
SUT VIC AB 0 CT1 18XCR BRD8 (SUTURE) ×1 IMPLANT
SUT VIC AB 0 CT1 8-18 (SUTURE) ×1
SUT VIC AB 2-0 CT1 18 (SUTURE) IMPLANT
SUT VIC AB 3-0 SH 8-18 (SUTURE) IMPLANT
SYR BULB 3OZ (MISCELLANEOUS) ×2 IMPLANT
TAPE CLOTH SURG 6X10 WHT LF (GAUZE/BANDAGES/DRESSINGS) ×2 IMPLANT
TOOL LONG TUNNEL (SPINAL CORD STIMULATOR) ×2 IMPLANT
TOWEL GREEN STERILE (TOWEL DISPOSABLE) ×2 IMPLANT
TOWEL GREEN STERILE FF (TOWEL DISPOSABLE) ×2 IMPLANT
WATER STERILE IRR 1000ML POUR (IV SOLUTION) ×2 IMPLANT

## 2017-11-25 NOTE — H&P (Signed)
BP 131/74   Pulse 75   Temp 98.4 F (36.9 C) (Oral)   Resp 18   SpO2 96%    Amanda Morris comes in today.  She at this point is deemed a suitable candidate for a spinal cord stimulator.  I explained procedure in detail and instructed her to make sure she wears a comfortable pair of pants so I know where not to put the pulse generator.  We typically try to span the T8 vertebral body and will make an incision and do a small laminectomy.  Height 5 feet, weight 224 pounds.  Blood pressure 114/77, pulse 83, temperature is 98.3.  Pain is 3/4.  She would like to proceed with this and we will get this going as soon as possible.   Allergies  Allergen Reactions  . Penicillins Hives    PATIENT HAS HAD A PCN REACTION WITH IMMEDIATE RASH, FACIAL/TONGUE/THROAT SWELLING, SOB, OR LIGHTHEADEDNESS WITH HYPOTENSION:  #  #  YES  #  #  Has patient had a PCN reaction causing severe rash involving mucus membranes or skin necrosis: No Has patient had a PCN reaction that required hospitalization: No Has patient had a PCN reaction occurring within the last 10 years: No If all of the above answers are "NO", then may proceed with Cephalosporin use.   . Aspirin Nausea Only  . Codeine Itching  . Hydrocodone Nausea Only  . Metformin And Related Diarrhea and Nausea Only  . Oxycodone Nausea And Vomiting  . Victoza [Liraglutide] Other (See Comments)    Abdominal pain   Past Medical History:  Diagnosis Date  . Allergy   . Diabetes mellitus without complication (HCC)   . GERD (gastroesophageal reflux disease)   . Headache    migraines last one 2 weeks ago  . History of hiatal hernia   . Hypercholesterolemia   . Palpitations    in the past, no current issues per patient   Past Surgical History:  Procedure Laterality Date  . ABDOMINAL HYSTERECTOMY  1982  . BACK SURGERY    . BREAST REDUCTION SURGERY Bilateral 03/17/2016   Procedure: MAMMARY REDUCTION  (BREAST);  Surgeon: Claire S Dillingham, DO;  Location: [facility]  ;  Service: Plastics;  Laterality: Bilateral;  . SHOULDER SURGERY  06/2008, 09/2010   Right, by Dr. Richie, then Dr. Laughenburger   Prior to Admission medications   Medication Sig Start Date End Date Taking? Authorizing Provider  aspirin EC 81 MG tablet Take 81 mg by mouth daily.   Yes [provider]  cholecalciferol (VITAMIN D) 1000 units tablet Take 1,000 Units by mouth daily.   Yes [provider]  DEXILANT 60 MG capsule Take 60 mg by mouth daily.  10/25/15  Yes [provider]  dicyclomine (BENTYL) 10 MG capsule Take 10 mg by mouth 4 (four) times daily.  01/06/17  Yes [provider]  doxepin (SINEQUAN) 10 MG capsule Take 10 mg by mouth at bedtime. 10/13/17  Yes [provider]  famotidine (PEPCID) 40 MG tablet Take 40 mg by mouth every evening. 07/27/17  Yes [provider]  fluticasone (FLONASE) 50 MCG/ACT nasal spray Place 1 spray into both nostrils daily. 08/24/17  Yes Metheney, Catherine D, MD  gabapentin (NEURONTIN) 300 MG capsule Take 1 capsule (300 mg total) by mouth 3 (three) times daily. MAX OF 3600MG/DAY Patient taking differently: Take 300 mg by mouth 3 (three) times daily.  10/17/17  Yes Metheney, Catherine D, MD  glipiZIDE (GLUCOTROL) 10 MG   tablet TAKE 1 TABLET TWICE A DAY 08/25/17  Yes Hali Marry, MD  Insulin Glargine (LANTUS SOLOSTAR) 100 UNIT/ML Solostar Pen Inject 36 Units into the skin daily at 10 pm. Patient taking differently: Inject 30 Units into the skin at bedtime.  12/22/16  Yes Hali Marry, MD  JARDIANCE 25 MG TABS tablet TAKE 1 TABLET DAILY 05/24/17  Yes Hali Marry, MD  levocetirizine (XYZAL) 5 MG tablet Take 1 tablet (5 mg total) by mouth every evening. 08/19/17  Yes Hali Marry, MD  Multiple Vitamins-Minerals (MULTIVITAMIN WOMEN 50+ PO) Take 1 capsule by mouth daily.   Yes [provider]  naproxen sodium (ALEVE) 220 MG tablet Take 220 mg by mouth 3  (three) times daily.    Yes [provider]  perphenazine (TRILAFON) 8 MG tablet TAKE ONE TABLET (8 MG TOTAL) BY MOUTH AT BEDTIME. 12/17/16  Yes [provider]  Phenyleph-Doxylamine-DM-APAP (NYQUIL SEVERE COLD/FLU PO) Take 1 Dose by mouth at bedtime as needed (allergies).   Yes [provider]  pravastatin (PRAVACHOL) 40 MG tablet TAKE 1 TABLET BY MOUTH AT BEDTIME 08/31/17  Yes Hali Marry, MD  ACCU-CHEK SOFTCLIX LANCETS lancets USE AS DIRECTED TO TEST BLOOD SUGAR 3 TIMES A DAY 09/15/17   Hali Marry, MD  B-D ULTRAFINE III SHORT PEN 31G X 8 MM MISC USE AS DIRECTED 03/02/17   Hali Marry, MD  blood glucose meter kit and supplies KIT Dispense glucometer, test strips, and lancets. Use to check blood sugar twice daily. Dx: E11.9 10/17/17   Hali Marry, MD  glucose blood (ACCU-CHEK AVIVA PLUS) test strip For testing blood sugars 3 times daily. Dx:E11.9 09/09/17   Hali Marry, MD   Family History  Problem Relation Age of Onset  . Heart disease Mother   . Diabetes Mother   . Hypertension Mother   . Stroke Mother   . Heart disease Father   . Heart disease Sister   . Diabetes Sister   . Hyperlipidemia Sister   . Hypertension Sister   . Stroke Sister   . Alcohol abuse Brother   . Hyperlipidemia Brother    Social History   Socioeconomic History  . Marital status: Single    Spouse name: Not on file  . Number of children: 1  . Years of education: 10th grade  . Highest education level: Not on file  Occupational History  . Occupation: Disabled.   Social Needs  . Financial resource strain: Not on file  . Food insecurity:    Worry: Not on file    Inability: Not on file  . Transportation needs:    Medical: Not on file    Non-medical: Not on file  Tobacco Use  . Smoking status: Former Smoker    Types: Cigarettes    Last attempt to quit: 07/30/2007    Years since quitting: 10.3  . Smokeless tobacco: Never Used  Substance  and Sexual Activity  . Alcohol use: No  . Drug use: No  . Sexual activity: Never  Lifestyle  . Physical activity:    Days per week: Not on file    Minutes per session: Not on file  . Stress: Not on file  Relationships  . Social connections:    Talks on phone: Not on file    Gets together: Not on file    Attends religious service: Not on file    Active member of club or organization: Not on file  Attends meetings of clubs or organizations: Not on file    Relationship status: Not on file  . Intimate partner violence:    Fear of current or ex partner: Not on file    Emotionally abused: Not on file    Physically abused: Not on file    Forced sexual activity: Not on file  Other Topics Concern  . Not on file  Social History Narrative   Caffeine intake. Some regular exercise.   Physical Exam  Constitutional: She is oriented to person, place, and time. She appears well-developed and well-nourished. No distress.  HENT:  Head: Normocephalic and atraumatic.  Eyes: Pupils are equal, round, and reactive to light. Conjunctivae and EOM are normal.  Neck: Normal range of motion. Neck supple.  Cardiovascular: Normal rate and regular rhythm.  Pulmonary/Chest: Effort normal and breath sounds normal.  Abdominal: Soft. Bowel sounds are normal.  Musculoskeletal: Normal range of motion.  Neurological: She is alert and oriented to person, place, and time. She has normal strength. She displays normal reflexes. No cranial nerve deficit or sensory deficit. She exhibits normal muscle tone. She displays a negative Romberg sign. Coordination normal. She displays no Babinski's sign on the right side. She displays no Babinski's sign on the left side.  Reflex Scores:      Patellar reflexes are 1+ on the right side and 2+ on the left side.      Achilles reflexes are 1+ on the right side and 1+ on the left side. Skin: Skin is warm and dry.  Psychiatric: She has a normal mood and affect. Her behavior is  normal. Judgment and thought content normal.

## 2017-11-25 NOTE — Op Note (Signed)
**Note Amanda-Identified via Obfuscation** 11/25/2017  11:31 AM  PATIENT:  Amanda Morris  67 y.o. 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, taken to the operating room for a spinal cord stimulator trial  PRE-OPERATIVE DIAGNOSIS:  Lumbar post laminectomy syndrome  POST-OPERATIVE DIAGNOSIS:  Lumbar post laminectomy syndrome  PROCEDURE:  Procedure(s): INSERTION LUMBAR SPINAL CORD STIMULATOR TRIAL    SURGEON: Surgeon(s): Ashok Pall, MD  ASSISTANTS:none  ANESTHESIA:   general  EBL:  Total I/O In: 950 [I.V.:950] Out: 10 [Blood:10]  BLOOD ADMINISTERED:none  CELL SAVER GIVEN:none  COUNT:per nursing  DRAINS: spinal cord stimulator leads   SPECIMEN:  No Specimen  DICTATION: Amanda Morris was taken to the operating room, intubated, and placed under a general anesthetic without difficulty. She  was positioned prone on the [place] table, with all pressure points properly padded. Her back was prepped and draped in a sterile manner. Using fluoroscopy I planned the incision. I opened the skin with a 10 blade and dissected sharply to the thoracolumbar fascia. I exposed the lamina of T9 and T10 confirming my location with fluoroscopy. I used the drill and Kerrison punches to perform a hemilaminectomy of T9.  I then, with fluoroscopic guidance, placed the paddle lead so that it spanned the T8 vertebral body. I connected the leads to the external leads, tunneled these leads through the skin exiting on her right side. I approximated the thoracolumbar fascia, and the subcutaneous plane with vicryl sutures. I used staples to approximate the skin edges. I applied a sterile dressing to the thoracic incision, and to the exit site of the external leads. She was then turned onto the stretcher supine, and extubated.   PLAN OF CARE: Admit for overnight observation  PATIENT DISPOSITION:  PACU - hemodynamically stable.   Delay start of Pharmacological VTE agent (>24hrs) due to surgical blood loss or risk of bleeding:  yes

## 2017-11-25 NOTE — Transfer of Care (Signed)
Immediate Anesthesia Transfer of Care Note  Patient: Amanda Morris  Procedure(s) Performed: INSERTION LUMBAR SPINAL CORD STIMULATOR TRIAL   (N/A )  Patient Location: PACU  Anesthesia Type:General  Level of Consciousness: awake and alert   Airway & Oxygen Therapy: Patient Spontanous Breathing and Patient connected to nasal cannula oxygen  Post-op Assessment: Report given to RN and Post -op Vital signs reviewed and stable  Post vital signs: Reviewed and stable  Last Vitals:  Vitals Value Taken Time  BP    Temp    Pulse 87 11/25/2017 11:22 AM  Resp 14 11/25/2017 11:22 AM  SpO2 97 % 11/25/2017 11:22 AM  Vitals shown include unvalidated device data.  Last Pain:  Vitals:   11/25/17 0730  TempSrc: Oral  PainSc: 6       Patients Stated Pain Goal: 4 (67/25/50 0164)  Complications: No apparent anesthesia complications

## 2017-11-25 NOTE — H&P (View-Only) (Signed)
BP 131/74   Pulse 75   Temp 98.4 F (36.9 C) (Oral)   Resp 18   SpO2 96%    Amanda Morris comes in today.  She at this point is deemed a suitable candidate for a spinal cord stimulator.  I explained procedure in detail and instructed her to make sure she wears a comfortable pair of pants so I know where not to put the pulse generator.  We typically try to span the T8 vertebral body and will make an incision and do a small laminectomy.  Height 5 feet, weight 224 pounds.  Blood pressure 114/77, pulse 83, temperature is 98.3.  Pain is 3/4.  She would like to proceed with this and we will get this going as soon as possible.   Allergies  Allergen Reactions  . Penicillins Hives    PATIENT HAS HAD A PCN REACTION WITH IMMEDIATE RASH, FACIAL/TONGUE/THROAT SWELLING, SOB, OR LIGHTHEADEDNESS WITH HYPOTENSION:  #  #  YES  #  #  Has patient had a PCN reaction causing severe rash involving mucus membranes or skin necrosis: No Has patient had a PCN reaction that required hospitalization: No Has patient had a PCN reaction occurring within the last 10 years: No If all of the above answers are "NO", then may proceed with Cephalosporin use.   . Aspirin Nausea Only  . Codeine Itching  . Hydrocodone Nausea Only  . Metformin And Related Diarrhea and Nausea Only  . Oxycodone Nausea And Vomiting  . Victoza [Liraglutide] Other (See Comments)    Abdominal pain   Past Medical History:  Diagnosis Date  . Allergy   . Diabetes mellitus without complication ([place])   . GERD (gastroesophageal reflux disease)   . Headache    migraines last one 2 weeks ago  . History of hiatal hernia   . Hypercholesterolemia   . Palpitations    in the past, no current issues per patient   Past Surgical History:  Procedure Laterality Date  . ABDOMINAL HYSTERECTOMY  1982  . BACK SURGERY    . BREAST REDUCTION SURGERY Bilateral 03/17/2016   Procedure: MAMMARY REDUCTION  (BREAST);  Surgeon: Wallace Going, DO;  Location: [facility];  Service: Plastics;  Laterality: Bilateral;  . SHOULDER SURGERY  06/2008, 09/2010   Right, by Dr. Karie Soda, then Dr. Judeth Horn   Prior to Admission medications   Medication Sig Start Date End Date Taking? Authorizing Provider  aspirin EC 81 MG tablet Take 81 mg by mouth daily.   Yes [provider]  cholecalciferol (VITAMIN D) 1000 units tablet Take 1,000 Units by mouth daily.   Yes [provider]  DEXILANT 60 MG capsule Take 60 mg by mouth daily.  10/25/15  Yes [provider]  dicyclomine (BENTYL) 10 MG capsule Take 10 mg by mouth 4 (four) times daily.  01/06/17  Yes [provider]  doxepin (SINEQUAN) 10 MG capsule Take 10 mg by mouth at bedtime. 10/13/17  Yes [provider]  famotidine (PEPCID) 40 MG tablet Take 40 mg by mouth every evening. 07/27/17  Yes [provider]  fluticasone (FLONASE) 50 MCG/ACT nasal spray Place 1 spray into both nostrils daily. 08/24/17  Yes Hali Marry, MD  gabapentin (NEURONTIN) 300 MG capsule Take 1 capsule (300 mg total) by mouth 3 (three) times daily. MAX OF 3600MG/DAY Patient taking differently: Take 300 mg by mouth 3 (three) times daily.  10/17/17  Yes Hali Marry, MD  glipiZIDE (GLUCOTROL) 10 MG  tablet TAKE 1 TABLET TWICE A DAY 08/25/17  Yes Hali Marry, MD  Insulin Glargine (LANTUS SOLOSTAR) 100 UNIT/ML Solostar Pen Inject 36 Units into the skin daily at 10 pm. Patient taking differently: Inject 30 Units into the skin at bedtime.  12/22/16  Yes Hali Marry, MD  JARDIANCE 25 MG TABS tablet TAKE 1 TABLET DAILY 05/24/17  Yes Hali Marry, MD  levocetirizine (XYZAL) 5 MG tablet Take 1 tablet (5 mg total) by mouth every evening. 08/19/17  Yes Hali Marry, MD  Multiple Vitamins-Minerals (MULTIVITAMIN WOMEN 50+ PO) Take 1 capsule by mouth daily.   Yes [provider]  naproxen sodium (ALEVE) 220 MG tablet Take 220 mg by mouth 3  (three) times daily.    Yes [provider]  perphenazine (TRILAFON) 8 MG tablet TAKE ONE TABLET (8 MG TOTAL) BY MOUTH AT BEDTIME. 12/17/16  Yes [provider]  Phenyleph-Doxylamine-DM-APAP (NYQUIL SEVERE COLD/FLU PO) Take 1 Dose by mouth at bedtime as needed (allergies).   Yes [provider]  pravastatin (PRAVACHOL) 40 MG tablet TAKE 1 TABLET BY MOUTH AT BEDTIME 08/31/17  Yes Hali Marry, MD  ACCU-CHEK SOFTCLIX LANCETS lancets USE AS DIRECTED TO TEST BLOOD SUGAR 3 TIMES A DAY 09/15/17   Hali Marry, MD  B-D ULTRAFINE III SHORT PEN 31G X 8 MM MISC USE AS DIRECTED 03/02/17   Hali Marry, MD  blood glucose meter kit and supplies KIT Dispense glucometer, test strips, and lancets. Use to check blood sugar twice daily. Dx: E11.9 10/17/17   Hali Marry, MD  glucose blood (ACCU-CHEK AVIVA PLUS) test strip For testing blood sugars 3 times daily. Dx:E11.9 09/09/17   Hali Marry, MD   Family History  Problem Relation Age of Onset  . Heart disease Mother   . Diabetes Mother   . Hypertension Mother   . Stroke Mother   . Heart disease Father   . Heart disease Sister   . Diabetes Sister   . Hyperlipidemia Sister   . Hypertension Sister   . Stroke Sister   . Alcohol abuse Brother   . Hyperlipidemia Brother    Social History   Socioeconomic History  . Marital status: Single    Spouse name: Not on file  . Number of children: 1  . Years of education: 10th grade  . Highest education level: Not on file  Occupational History  . Occupation: Disabled.   Social Needs  . Financial resource strain: Not on file  . Food insecurity:    Worry: Not on file    Inability: Not on file  . Transportation needs:    Medical: Not on file    Non-medical: Not on file  Tobacco Use  . Smoking status: Former Smoker    Types: Cigarettes    Last attempt to quit: 07/30/2007    Years since quitting: 10.3  . Smokeless tobacco: Never Used  Substance  and Sexual Activity  . Alcohol use: No  . Drug use: No  . Sexual activity: Never  Lifestyle  . Physical activity:    Days per week: Not on file    Minutes per session: Not on file  . Stress: Not on file  Relationships  . Social connections:    Talks on phone: Not on file    Gets together: Not on file    Attends religious service: Not on file    Active member of club or organization: Not on file  Attends meetings of clubs or organizations: Not on file    Relationship status: Not on file  . Intimate partner violence:    Fear of current or ex partner: Not on file    Emotionally abused: Not on file    Physically abused: Not on file    Forced sexual activity: Not on file  Other Topics Concern  . Not on file  Social History Narrative   Caffeine intake. Some regular exercise.   Physical Exam  Constitutional: She is oriented to person, place, and time. She appears well-developed and well-nourished. No distress.  HENT:  Head: Normocephalic and atraumatic.  Eyes: Pupils are equal, round, and reactive to light. Conjunctivae and EOM are normal.  Neck: Normal range of motion. Neck supple.  Cardiovascular: Normal rate and regular rhythm.  Pulmonary/Chest: Effort normal and breath sounds normal.  Abdominal: Soft. Bowel sounds are normal.  Musculoskeletal: Normal range of motion.  Neurological: She is alert and oriented to person, place, and time. She has normal strength. She displays normal reflexes. No cranial nerve deficit or sensory deficit. She exhibits normal muscle tone. She displays a negative Romberg sign. Coordination normal. She displays no Babinski's sign on the right side. She displays no Babinski's sign on the left side.  Reflex Scores:      Patellar reflexes are 1+ on the right side and 2+ on the left side.      Achilles reflexes are 1+ on the right side and 1+ on the left side. Skin: Skin is warm and dry.  Psychiatric: She has a normal mood and affect. Her behavior is  normal. Judgment and thought content normal.

## 2017-11-25 NOTE — Anesthesia Postprocedure Evaluation (Signed)
Anesthesia Post Note  Patient: Amanda Morris  Procedure(s) Performed: INSERTION LUMBAR SPINAL CORD STIMULATOR TRIAL   (N/A )     Patient location during evaluation: PACU Anesthesia Type: General Level of consciousness: awake and alert Pain management: pain level controlled Vital Signs Assessment: post-procedure vital signs reviewed and stable Respiratory status: spontaneous breathing, nonlabored ventilation, respiratory function stable and patient connected to nasal cannula oxygen Cardiovascular status: blood pressure returned to baseline and stable Postop Assessment: no apparent nausea or vomiting Anesthetic complications: no    Last Vitals:  Vitals:   11/25/17 1210 11/25/17 1232  BP: 127/67 (!) 142/81  Pulse: 85 81  Resp: (!) 22 18  Temp:  36.8 C  SpO2: 92% 100%    Last Pain:  Vitals:   11/25/17 1236  TempSrc:   PainSc: 5                  Katelynne Revak S

## 2017-11-25 NOTE — Anesthesia Procedure Notes (Signed)
Procedure Name: Intubation Date/Time: 11/25/2017 9:27 AM Performed by: Eligha Bridegroom, CRNA Pre-anesthesia Checklist: Patient identified, Emergency Drugs available, Suction available, Timeout performed and Patient being monitored Patient Re-evaluated:Patient Re-evaluated prior to induction Oxygen Delivery Method: Circle system utilized Preoxygenation: Pre-oxygenation with 100% oxygen Induction Type: IV induction Ventilation: Mask ventilation without difficulty Laryngoscope Size: Mac and 3 Grade View: Grade I Tube size: 7.0 mm Number of attempts: 1 Airway Equipment and Method: Stylet Placement Confirmation: ETT inserted through vocal cords under direct vision,  positive ETCO2 and breath sounds checked- equal and bilateral Secured at: 21 cm Dental Injury: Teeth and Oropharynx as per pre-operative assessment

## 2017-11-25 NOTE — Anesthesia Preprocedure Evaluation (Signed)
Anesthesia Evaluation  Patient identified by MRN, date of birth, ID band Patient awake    Reviewed: Allergy & Precautions, NPO status , Patient's Chart, lab work & pertinent test results  Airway Mallampati: II  TM Distance: >3 FB Neck ROM: Full    Dental no notable dental hx.    Pulmonary neg pulmonary ROS, former smoker,    Pulmonary exam normal breath sounds clear to auscultation       Cardiovascular hypertension, + Peripheral Vascular Disease  Normal cardiovascular exam Rhythm:Regular Rate:Normal     Neuro/Psych negative neurological ROS  negative psych ROS   GI/Hepatic negative GI ROS, Neg liver ROS,   Endo/Other  diabetes, Insulin DependentMorbid obesity  Renal/GU negative Renal ROS  negative genitourinary   Musculoskeletal negative musculoskeletal ROS (+)   Abdominal   Peds negative pediatric ROS (+)  Hematology negative hematology ROS (+)   Anesthesia Other Findings   Reproductive/Obstetrics negative OB ROS                             Anesthesia Physical Anesthesia Plan  ASA: III  Anesthesia Plan: General   Post-op Pain Management:    Induction: Intravenous  PONV Risk Score and Plan: 3 and Ondansetron, Dexamethasone and Treatment may vary due to age or medical condition  Airway Management Planned: Oral ETT  Additional Equipment:   Intra-op Plan:   Post-operative Plan: Extubation in OR  Informed Consent: I have reviewed the patients History and Physical, chart, labs and discussed the procedure including the risks, benefits and alternatives for the proposed anesthesia with the patient or authorized representative who has indicated his/her understanding and acceptance.   Dental advisory given  Plan Discussed with: CRNA and Surgeon  Anesthesia Plan Comments:         Anesthesia Quick Evaluation

## 2017-11-25 NOTE — Plan of Care (Signed)
  Problem: Education: Goal: Ability to verbalize activity precautions or restrictions will improve Outcome: Progressing Goal: Knowledge of the prescribed therapeutic regimen will improve Outcome: Progressing Goal: Understanding of discharge needs will improve Outcome: Progressing   

## 2017-11-26 DIAGNOSIS — Z880 Allergy status to penicillin: Secondary | ICD-10-CM | POA: Diagnosis not present

## 2017-11-26 DIAGNOSIS — Z886 Allergy status to analgesic agent status: Secondary | ICD-10-CM | POA: Diagnosis not present

## 2017-11-26 DIAGNOSIS — Z79899 Other long term (current) drug therapy: Secondary | ICD-10-CM | POA: Diagnosis not present

## 2017-11-26 DIAGNOSIS — Z8249 Family history of ischemic heart disease and other diseases of the circulatory system: Secondary | ICD-10-CM | POA: Diagnosis not present

## 2017-11-26 DIAGNOSIS — I10 Essential (primary) hypertension: Secondary | ICD-10-CM | POA: Diagnosis not present

## 2017-11-26 DIAGNOSIS — Z888 Allergy status to other drugs, medicaments and biological substances status: Secondary | ICD-10-CM | POA: Diagnosis not present

## 2017-11-26 DIAGNOSIS — E1151 Type 2 diabetes mellitus with diabetic peripheral angiopathy without gangrene: Secondary | ICD-10-CM | POA: Diagnosis not present

## 2017-11-26 DIAGNOSIS — Z7982 Long term (current) use of aspirin: Secondary | ICD-10-CM | POA: Diagnosis not present

## 2017-11-26 DIAGNOSIS — Z87891 Personal history of nicotine dependence: Secondary | ICD-10-CM | POA: Diagnosis not present

## 2017-11-26 DIAGNOSIS — E7800 Pure hypercholesterolemia, unspecified: Secondary | ICD-10-CM | POA: Diagnosis not present

## 2017-11-26 DIAGNOSIS — Z885 Allergy status to narcotic agent status: Secondary | ICD-10-CM | POA: Diagnosis not present

## 2017-11-26 DIAGNOSIS — M961 Postlaminectomy syndrome, not elsewhere classified: Secondary | ICD-10-CM | POA: Diagnosis not present

## 2017-11-26 DIAGNOSIS — Z794 Long term (current) use of insulin: Secondary | ICD-10-CM | POA: Diagnosis not present

## 2017-11-26 LAB — GLUCOSE, CAPILLARY: Glucose-Capillary: 106 mg/dL — ABNORMAL HIGH (ref 70–99)

## 2017-11-26 MED ORDER — TRAMADOL HCL 50 MG PO TABS: 50.0000 mg | ORAL_TABLET | Freq: Four times a day (QID) | ORAL | 0 refills | Status: DC | PRN

## 2017-11-26 MED ORDER — TRAMADOL HCL 50 MG PO TABS
50.0000 mg | ORAL_TABLET | Freq: Four times a day (QID) | ORAL | Status: DC | PRN
  Administered 2017-11-26: 50 mg via ORAL
  Filled 2017-11-26: qty 1

## 2017-11-26 NOTE — Progress Notes (Signed)
Subjective: Patient reports good coverage of pain  Objective: Vital signs in last 24 hours: Temp:  [98 F (36.7 C)-99.1 F (37.3 C)] 98.4 F (36.9 C) (08/03 0318) Pulse Rate:  [75-116] 79 (08/03 0318) Resp:  [8-26] 18 (08/03 0318) BP: (105-142)/(63-84) 106/64 (08/03 0318) SpO2:  [91 %-100 %] 97 % (08/03 0318) Weight:  [102.1 kg (225 lb)] 102.1 kg (225 lb) (08/02 1249)  Intake/Output from previous day: 08/02 0701 - 08/03 0700 In: 993.3 [I.V.:993.3] Out: 10 [Blood:10] Intake/Output this shift: Total I/O In: 3 [I.V.:3] Out: -   Physical Exam: Dressing CDI. Strength in legs is full  Lab Results: No results for input(s): WBC, HGB, HCT, PLT in the last 72 hours. BMET No results for input(s): NA, K, CL, CO2, GLUCOSE, BUN, CREATININE, CALCIUM in the last 72 hours.  Studies/Results: Dg Thoracic Spine 1 View  Result Date: 11/25/2017 CLINICAL DATA:  Neurostimulator placement. EXAM: OPERATIVE THORACIC SPINE 2 VIEW(S) COMPARISON:  None FINDINGS: Two C-arm images show placement of neurostimulator device which overlies the midline of the spinal canal, apparently at the T10-11 level. IMPRESSION: Neurostimulator projects over the lower thoracic midline. Electronically Signed   By: Nelson Chimes M.D.   On: 11/25/2017 11:47   Dg C-arm 1-60 Min  Result Date: 11/25/2017 CLINICAL DATA:  Neurostimulator placement. EXAM: OPERATIVE THORACIC SPINE 2 VIEW(S) COMPARISON:  None FINDINGS: Two C-arm images show placement of neurostimulator device which overlies the midline of the spinal canal, apparently at the T10-11 level. IMPRESSION: Neurostimulator projects over the lower thoracic midline. Electronically Signed   By: Nelson Chimes M.D.   On: 11/25/2017 11:47    Assessment/Plan: Doing well.  Discharge home.    LOS: 0 days    Peggyann Shoals, MD 11/26/2017, 6:47 AM

## 2017-11-26 NOTE — Discharge Instructions (Signed)
Wound Care Leave incision alone Do not scrub directly on incision.  Do not shower Do not put any creams, lotions, or ointments on incision. Activity Walk each and every day, increasing distance each day. No lifting greater than 5 lbs.  Avoid bending, arching, and twisting. No driving for 2 weeks; may ride as a passenger locally.  Diet Resume your normal diet.  Return to Work Will be discussed at you follow up appointment. Call Your Doctor If Any of These Occur Redness, drainage, or swelling at the wound.  Temperature greater than 101 degrees. Severe pain not relieved by pain medication. Incision starts to come apart. Follow Up Appt Call  [phone])  For any problems.  If you have any hardware placed in your spine, you will need an x-ray before your appointment.

## 2017-11-26 NOTE — Progress Notes (Signed)
Patient is discharged from room 3C08 at this time. Alert and in stable condition. IV sited/c'd and instructions read to patient and niece with understanding verbalized. Left unit via wheelchair with all belongings at side.

## 2017-11-26 NOTE — Discharge Summary (Signed)
Physician Discharge Summary  Patient ID: Amanda Morris MRN: [id number] DOB/AGE: Oct 19, 1950 67 y.o.  Admit date: 11/25/2017 Discharge date: 11/26/2017  Admission Diagnoses:Failed back syndrome  Discharge Diagnoses: Same Active Problems:   Failed back syndrome of lumbar spine   Discharged Condition: good  Hospital Course: Patient had placement of trial electrodes for trial of spinal cord stimulation.  She tolerated the procedure well and has good coverage of pain with electrodes in their current configuration  Consults: None  Significant Diagnostic Studies: None  Treatments: surgery: Patient had placement of trial electrodes for trial of spinal cord stimulation.  She tolerated the procedure well and has good coverage of pain with electrodes in their current configuration  Discharge Exam: Blood pressure 106/64, pulse 79, temperature 98.4 F (36.9 C), temperature source Oral, resp. rate 18, height 5' 4"  (1.626 m), weight 102.1 kg (225 lb), SpO2 97 %. Neurologic: Alert and oriented X 3, normal strength and tone. Normal symmetric reflexes. Normal coordination and gait Wound:CDI  Disposition: Home  Discharge Instructions    Diet - low sodium heart healthy   Complete by:  As directed    Increase activity slowly   Complete by:  As directed      Allergies as of 11/26/2017      Reactions   Penicillins Hives   PATIENT HAS HAD A PCN REACTION WITH IMMEDIATE RASH, FACIAL/TONGUE/THROAT SWELLING, SOB, OR LIGHTHEADEDNESS WITH HYPOTENSION:  #  #  YES  #  #  Has patient had a PCN reaction causing severe rash involving mucus membranes or skin necrosis: No Has patient had a PCN reaction that required hospitalization: No Has patient had a PCN reaction occurring within the last 10 years: No If all of the above answers are "NO", then may proceed with Cephalosporin use.   Aspirin Nausea Only   Codeine Itching   Hydrocodone Nausea Only   Metformin And Related Diarrhea, Nausea Only   Oxycodone Nausea And Vomiting   Victoza [liraglutide] Other (See Comments)   Abdominal pain      Medication List    TAKE these medications   ACCU-CHEK SOFTCLIX LANCETS lancets USE AS DIRECTED TO TEST BLOOD SUGAR 3 TIMES A DAY   ALEVE 220 MG tablet Generic drug:  naproxen sodium Take 220 mg by mouth 3 (three) times daily.   aspirin EC 81 MG tablet Take 81 mg by mouth daily.   B-D ULTRAFINE III SHORT PEN 31G X 8 MM Misc Generic drug:  Insulin Pen Needle USE AS DIRECTED   blood glucose meter kit and supplies Kit Dispense glucometer, test strips, and lancets. Use to check blood sugar twice daily. Dx: E11.9   cholecalciferol 1000 units tablet Commonly known as:  VITAMIN D Take 1,000 Units by mouth daily.   DEXILANT 60 MG capsule Generic drug:  dexlansoprazole Take 60 mg by mouth daily.   dicyclomine 10 MG capsule Commonly known as:  BENTYL Take 10 mg by mouth 4 (four) times daily.   doxepin 10 MG capsule Commonly known as:  SINEQUAN Take 10 mg by mouth at bedtime.   famotidine 40 MG tablet Commonly known as:  PEPCID Take 40 mg by mouth every evening.   fluticasone 50 MCG/ACT nasal spray Commonly known as:  FLONASE Place 1 spray into both nostrils daily.   gabapentin 300 MG capsule Commonly known as:  NEURONTIN Take 1 capsule (300 mg total) by mouth 3 (three) times daily. MAX OF 3600MG/DAY What changed:  additional instructions   glipiZIDE 10 MG tablet Commonly  known as:  GLUCOTROL TAKE 1 TABLET TWICE A DAY   glucose blood test strip Commonly known as:  ACCU-CHEK AVIVA PLUS For testing blood sugars 3 times daily. Dx:E11.9   Insulin Glargine 100 UNIT/ML Solostar Pen Commonly known as:  LANTUS SOLOSTAR Inject 36 Units into the skin daily at 10 pm. What changed:    how much to take  when to take this   JARDIANCE 25 MG Tabs tablet Generic drug:  empagliflozin TAKE 1 TABLET DAILY   levocetirizine 5 MG tablet Commonly known as:  XYZAL Take 1 tablet (5  mg total) by mouth every evening.   MULTIVITAMIN WOMEN 50+ PO Take 1 capsule by mouth daily.   NYQUIL SEVERE COLD/FLU PO Take 1 Dose by mouth at bedtime as needed (allergies).   perphenazine 8 MG tablet Commonly known as:  TRILAFON TAKE ONE TABLET (8 MG TOTAL) BY MOUTH AT BEDTIME.   pravastatin 40 MG tablet Commonly known as:  PRAVACHOL TAKE 1 TABLET BY MOUTH AT BEDTIME   traMADol 50 MG tablet Commonly known as:  ULTRAM Take 1 tablet (50 mg total) by mouth every 6 (six) hours as needed for moderate pain.        Signed: Peggyann Shoals, MD 11/26/2017, 6:48 AM

## 2017-11-29 ENCOUNTER — Encounter (HOSPITAL_COMMUNITY): Payer: Self-pay | Admitting: Neurosurgery

## 2017-11-30 ENCOUNTER — Other Ambulatory Visit: Payer: Self-pay | Admitting: Family Medicine

## 2017-12-01 ENCOUNTER — Other Ambulatory Visit: Payer: Self-pay

## 2017-12-01 ENCOUNTER — Encounter (HOSPITAL_COMMUNITY): Payer: Self-pay | Admitting: *Deleted

## 2017-12-01 MED ORDER — SODIUM CHLORIDE 0.9 % IV SOLN
1500.0000 mg | INTRAVENOUS | Status: AC
  Administered 2017-12-02: 1500 mg via INTRAVENOUS
  Filled 2017-12-01: qty 1500

## 2017-12-01 NOTE — Progress Notes (Signed)
Pt denies SOB, chest pain, and being under the care of a cardiologist. Pt denies having a stress test, echo and cardiac cath. Pt denies having a chest x ray within the last year. Pt made aware to stop taking  Aspirin, vitamins, fish oil and herbal medications. Do not take any NSAIDs ie: Ibuprofen, Advil, Naproxen (Aleve), Motrin, BC and Goody Powder or any medication containing Aspirin. Pt made aware to not take Glipizide at HS or DOS, no Jardiance DOS and only 15 units (half dose ) of Lantus insulin tonight. Pt made aware to check BG every 2 hours prior to arrival to hospital on DOS. Pt made aware to treat a BG < 70 with4 ounces of apple  juice, wait 15 minutes after intervention to recheck BG, if BG remains < 70, call Short Stay unit to speak with a nurse. Pt verbalized understanding of all pre-op instructions.

## 2017-12-02 ENCOUNTER — Ambulatory Visit (HOSPITAL_COMMUNITY)
Admission: RE | Admit: 2017-12-02 | Discharge: 2017-12-03 | Disposition: A | Discharge status: Home / Self Care | Payer: Medicare Other | Source: Ambulatory Visit | Attending: Neurosurgery | Admitting: Neurosurgery

## 2017-12-02 ENCOUNTER — Ambulatory Visit (HOSPITAL_COMMUNITY): Payer: Medicare Other | Admitting: Anesthesiology

## 2017-12-02 ENCOUNTER — Encounter (HOSPITAL_COMMUNITY): Payer: Self-pay | Admitting: *Deleted

## 2017-12-02 ENCOUNTER — Encounter (HOSPITAL_COMMUNITY)
Admission: RE | Disposition: A | Discharge status: Home / Self Care | Payer: Self-pay | Source: Ambulatory Visit | Attending: Neurosurgery

## 2017-12-02 ENCOUNTER — Other Ambulatory Visit: Payer: Self-pay

## 2017-12-02 DIAGNOSIS — Z794 Long term (current) use of insulin: Secondary | ICD-10-CM | POA: Diagnosis not present

## 2017-12-02 DIAGNOSIS — E1151 Type 2 diabetes mellitus with diabetic peripheral angiopathy without gangrene: Secondary | ICD-10-CM | POA: Insufficient documentation

## 2017-12-02 DIAGNOSIS — Z7951 Long term (current) use of inhaled steroids: Secondary | ICD-10-CM | POA: Diagnosis not present

## 2017-12-02 DIAGNOSIS — Z87891 Personal history of nicotine dependence: Secondary | ICD-10-CM | POA: Insufficient documentation

## 2017-12-02 DIAGNOSIS — K219 Gastro-esophageal reflux disease without esophagitis: Secondary | ICD-10-CM | POA: Insufficient documentation

## 2017-12-02 DIAGNOSIS — M961 Postlaminectomy syndrome, not elsewhere classified: Secondary | ICD-10-CM | POA: Diagnosis present

## 2017-12-02 DIAGNOSIS — Z7982 Long term (current) use of aspirin: Secondary | ICD-10-CM | POA: Diagnosis not present

## 2017-12-02 DIAGNOSIS — Z79899 Other long term (current) drug therapy: Secondary | ICD-10-CM | POA: Diagnosis not present

## 2017-12-02 DIAGNOSIS — I10 Essential (primary) hypertension: Secondary | ICD-10-CM | POA: Diagnosis not present

## 2017-12-02 DIAGNOSIS — E7800 Pure hypercholesterolemia, unspecified: Secondary | ICD-10-CM | POA: Insufficient documentation

## 2017-12-02 DIAGNOSIS — Z791 Long term (current) use of non-steroidal anti-inflammatories (NSAID): Secondary | ICD-10-CM | POA: Diagnosis not present

## 2017-12-02 DIAGNOSIS — Z6838 Body mass index (BMI) 38.0-38.9, adult: Secondary | ICD-10-CM | POA: Diagnosis not present

## 2017-12-02 HISTORY — DX: Postlaminectomy syndrome, not elsewhere classified: M96.1

## 2017-12-02 HISTORY — PX: SPINAL CORD STIMULATOR INSERTION: SHX5378

## 2017-12-02 LAB — GLUCOSE, CAPILLARY
Glucose-Capillary: 108 mg/dL — ABNORMAL HIGH (ref 70–99)
Glucose-Capillary: 72 mg/dL (ref 70–99)
Glucose-Capillary: 77 mg/dL (ref 70–99)
Glucose-Capillary: 111 mg/dL — ABNORMAL HIGH (ref 70–99)
Glucose-Capillary: 194 mg/dL — ABNORMAL HIGH (ref 70–99)

## 2017-12-02 SURGERY — INSERTION, SPINAL CORD STIMULATOR, LUMBAR
Anesthesia: General | Site: Back

## 2017-12-02 MED ORDER — SENNOSIDES-DOCUSATE SODIUM 8.6-50 MG PO TABS: 1.0000 | ORAL_TABLET | Freq: Every evening | ORAL | Status: DC | PRN

## 2017-12-02 MED ORDER — THROMBIN 5000 UNITS EX SOLR
CUTANEOUS | Status: DC | PRN
  Administered 2017-12-02 (×2): 5000 [IU] via TOPICAL

## 2017-12-02 MED ORDER — LACTATED RINGERS IV SOLN
INTRAVENOUS | Status: DC
  Administered 2017-12-02: 14:00:00 via INTRAVENOUS

## 2017-12-02 MED ORDER — CHLORHEXIDINE GLUCONATE CLOTH 2 % EX PADS: 6.0000 | MEDICATED_PAD | Freq: Once | CUTANEOUS | Status: DC

## 2017-12-02 MED ORDER — THROMBIN 5000 UNITS EX SOLR
CUTANEOUS | Status: AC
  Filled 2017-12-02: qty 10000

## 2017-12-02 MED ORDER — EPHEDRINE 5 MG/ML INJ
INTRAVENOUS | Status: AC
  Filled 2017-12-02: qty 10

## 2017-12-02 MED ORDER — LIDOCAINE-EPINEPHRINE 0.5 %-1:200000 IJ SOLN
INTRAMUSCULAR | Status: AC
  Filled 2017-12-02: qty 1

## 2017-12-02 MED ORDER — DEXAMETHASONE SODIUM PHOSPHATE 10 MG/ML IJ SOLN
INTRAMUSCULAR | Status: AC
  Filled 2017-12-02: qty 1

## 2017-12-02 MED ORDER — LACTATED RINGERS IV SOLN
INTRAVENOUS | Status: DC
  Administered 2017-12-02 (×2): via INTRAVENOUS

## 2017-12-02 MED ORDER — ONDANSETRON HCL 4 MG/2ML IJ SOLN
INTRAMUSCULAR | Status: AC
  Filled 2017-12-02: qty 2

## 2017-12-02 MED ORDER — ACETAMINOPHEN 650 MG RE SUPP: 650.0000 mg | RECTAL | Status: DC | PRN

## 2017-12-02 MED ORDER — LIDOCAINE 2% (20 MG/ML) 5 ML SYRINGE
INTRAMUSCULAR | Status: AC
  Filled 2017-12-02: qty 5

## 2017-12-02 MED ORDER — LORATADINE 10 MG PO TABS
10.0000 mg | ORAL_TABLET | Freq: Every day | ORAL | Status: DC
  Administered 2017-12-02: 10 mg via ORAL
  Filled 2017-12-02: qty 1

## 2017-12-02 MED ORDER — ROCURONIUM BROMIDE 10 MG/ML (PF) SYRINGE
PREFILLED_SYRINGE | INTRAVENOUS | Status: AC
  Filled 2017-12-02: qty 10

## 2017-12-02 MED ORDER — MULTIVITAMIN WOMEN 50+ PO TABS: 1.0000 | ORAL_TABLET | Freq: Every day | ORAL | Status: DC

## 2017-12-02 MED ORDER — SUGAMMADEX SODIUM 200 MG/2ML IV SOLN
INTRAVENOUS | Status: DC | PRN
  Administered 2017-12-02: 200 mg via INTRAVENOUS

## 2017-12-02 MED ORDER — PANTOPRAZOLE SODIUM 40 MG PO TBEC
40.0000 mg | DELAYED_RELEASE_TABLET | Freq: Every day | ORAL | Status: DC
  Administered 2017-12-02 – 2017-12-03 (×2): 40 mg via ORAL
  Filled 2017-12-02 (×2): qty 1

## 2017-12-02 MED ORDER — ASPIRIN EC 81 MG PO TBEC: 81.0000 mg | DELAYED_RELEASE_TABLET | Freq: Every day | ORAL | Status: DC

## 2017-12-02 MED ORDER — LIDOCAINE-EPINEPHRINE 0.5 %-1:200000 IJ SOLN
INTRAMUSCULAR | Status: DC | PRN
  Administered 2017-12-02: 5 mL

## 2017-12-02 MED ORDER — PERPHENAZINE 8 MG PO TABS
8.0000 mg | ORAL_TABLET | Freq: Every day | ORAL | Status: DC
  Filled 2017-12-02: qty 1

## 2017-12-02 MED ORDER — PROPOFOL 10 MG/ML IV BOLUS
INTRAVENOUS | Status: AC
  Filled 2017-12-02: qty 20

## 2017-12-02 MED ORDER — FAMOTIDINE 20 MG PO TABS
40.0000 mg | ORAL_TABLET | Freq: Every evening | ORAL | Status: DC
  Administered 2017-12-02: 40 mg via ORAL
  Filled 2017-12-02: qty 2

## 2017-12-02 MED ORDER — MIDAZOLAM HCL 2 MG/2ML IJ SOLN
INTRAMUSCULAR | Status: DC | PRN
  Administered 2017-12-02: 1 mg via INTRAVENOUS

## 2017-12-02 MED ORDER — ACETAMINOPHEN 325 MG PO TABS
650.0000 mg | ORAL_TABLET | ORAL | Status: DC | PRN
  Administered 2017-12-02: 650 mg via ORAL
  Filled 2017-12-02 (×2): qty 2

## 2017-12-02 MED ORDER — ONDANSETRON HCL 4 MG/2ML IJ SOLN: 4.0000 mg | Freq: Four times a day (QID) | INTRAMUSCULAR | Status: DC | PRN

## 2017-12-02 MED ORDER — PHENOL 1.4 % MT LIQD
1.0000 | OROMUCOSAL | Status: DC | PRN
Start: 2017-12-02 — End: 2017-12-03

## 2017-12-02 MED ORDER — FENTANYL CITRATE (PF) 100 MCG/2ML IJ SOLN
INTRAMUSCULAR | Status: DC | PRN
  Administered 2017-12-02: 100 ug via INTRAVENOUS
  Administered 2017-12-02 (×2): 50 ug via INTRAVENOUS

## 2017-12-02 MED ORDER — CELECOXIB 200 MG PO CAPS
200.0000 mg | ORAL_CAPSULE | Freq: Two times a day (BID) | ORAL | Status: DC
  Administered 2017-12-02: 200 mg via ORAL
  Filled 2017-12-02: qty 1

## 2017-12-02 MED ORDER — LEVOCETIRIZINE DIHYDROCHLORIDE 5 MG PO TABS: 5.0000 mg | ORAL_TABLET | Freq: Every evening | ORAL | Status: DC

## 2017-12-02 MED ORDER — ADULT MULTIVITAMIN W/MINERALS CH: 1.0000 | ORAL_TABLET | Freq: Every day | ORAL | Status: DC

## 2017-12-02 MED ORDER — MENTHOL 3 MG MT LOZG: 1.0000 | LOZENGE | OROMUCOSAL | Status: DC | PRN

## 2017-12-02 MED ORDER — ZOLPIDEM TARTRATE 5 MG PO TABS: 5.0000 mg | ORAL_TABLET | Freq: Every evening | ORAL | Status: DC | PRN

## 2017-12-02 MED ORDER — ONDANSETRON HCL 4 MG PO TABS: 4.0000 mg | ORAL_TABLET | Freq: Four times a day (QID) | ORAL | Status: DC | PRN

## 2017-12-02 MED ORDER — SODIUM CHLORIDE 0.9% FLUSH: 3.0000 mL | Freq: Two times a day (BID) | INTRAVENOUS | Status: DC

## 2017-12-02 MED ORDER — GLIPIZIDE 10 MG PO TABS
10.0000 mg | ORAL_TABLET | Freq: Two times a day (BID) | ORAL | Status: DC
  Filled 2017-12-02 (×2): qty 1

## 2017-12-02 MED ORDER — HEMOSTATIC AGENTS (NO CHARGE) OPTIME
TOPICAL | Status: DC | PRN
  Administered 2017-12-02: 1 via TOPICAL

## 2017-12-02 MED ORDER — 0.9 % SODIUM CHLORIDE (POUR BTL) OPTIME
TOPICAL | Status: DC | PRN
  Administered 2017-12-02: 1000 mL

## 2017-12-02 MED ORDER — PHENYLEPHRINE 40 MCG/ML (10ML) SYRINGE FOR IV PUSH (FOR BLOOD PRESSURE SUPPORT)
PREFILLED_SYRINGE | INTRAVENOUS | Status: DC | PRN
  Administered 2017-12-02: 120 ug via INTRAVENOUS

## 2017-12-02 MED ORDER — DIAZEPAM 5 MG PO TABS
5.0000 mg | ORAL_TABLET | Freq: Four times a day (QID) | ORAL | Status: DC | PRN
  Administered 2017-12-02: 5 mg via ORAL
  Filled 2017-12-02 (×2): qty 1

## 2017-12-02 MED ORDER — SODIUM CHLORIDE 0.9% FLUSH: 3.0000 mL | INTRAVENOUS | Status: DC | PRN

## 2017-12-02 MED ORDER — DICYCLOMINE HCL 10 MG PO CAPS
10.0000 mg | ORAL_CAPSULE | Freq: Four times a day (QID) | ORAL | Status: DC
  Administered 2017-12-02: 10 mg via ORAL
  Filled 2017-12-02: qty 1

## 2017-12-02 MED ORDER — FLUTICASONE PROPIONATE 50 MCG/ACT NA SUSP
1.0000 | Freq: Every day | NASAL | Status: DC
  Administered 2017-12-02: 1 via NASAL
  Filled 2017-12-02: qty 16

## 2017-12-02 MED ORDER — FENTANYL CITRATE (PF) 250 MCG/5ML IJ SOLN
INTRAMUSCULAR | Status: AC
  Filled 2017-12-02: qty 5

## 2017-12-02 MED ORDER — FENTANYL CITRATE (PF) 100 MCG/2ML IJ SOLN
INTRAMUSCULAR | Status: AC
  Administered 2017-12-02: 21:00:00
  Filled 2017-12-02: qty 2

## 2017-12-02 MED ORDER — GABAPENTIN 300 MG PO CAPS
300.0000 mg | ORAL_CAPSULE | Freq: Three times a day (TID) | ORAL | Status: DC
  Administered 2017-12-02: 300 mg via ORAL
  Filled 2017-12-02: qty 1

## 2017-12-02 MED ORDER — VITAMIN D 1000 UNITS PO TABS: 1000.0000 [IU] | ORAL_TABLET | Freq: Every day | ORAL | Status: DC

## 2017-12-02 MED ORDER — ONDANSETRON HCL 4 MG/2ML IJ SOLN: 4.0000 mg | Freq: Once | INTRAMUSCULAR | Status: DC | PRN

## 2017-12-02 MED ORDER — CANAGLIFLOZIN 100 MG PO TABS
100.0000 mg | ORAL_TABLET | Freq: Every day | ORAL | Status: DC
  Administered 2017-12-03: 100 mg via ORAL
  Filled 2017-12-02: qty 1

## 2017-12-02 MED ORDER — DEXAMETHASONE SODIUM PHOSPHATE 10 MG/ML IJ SOLN
INTRAMUSCULAR | Status: DC | PRN
  Administered 2017-12-02: 10 mg via INTRAVENOUS

## 2017-12-02 MED ORDER — SODIUM CHLORIDE 0.9 % IV SOLN: 250.0000 mL | INTRAVENOUS | Status: DC

## 2017-12-02 MED ORDER — PRAVASTATIN SODIUM 40 MG PO TABS
40.0000 mg | ORAL_TABLET | Freq: Every day | ORAL | Status: DC
  Administered 2017-12-02: 40 mg via ORAL
  Filled 2017-12-02: qty 1

## 2017-12-02 MED ORDER — DOXEPIN HCL 10 MG PO CAPS
10.0000 mg | ORAL_CAPSULE | Freq: Every day | ORAL | Status: DC
  Filled 2017-12-02: qty 1

## 2017-12-02 MED ORDER — LIDOCAINE 2% (20 MG/ML) 5 ML SYRINGE
INTRAMUSCULAR | Status: DC | PRN
  Administered 2017-12-02: 100 mg via INTRAVENOUS

## 2017-12-02 MED ORDER — INSULIN GLARGINE 100 UNIT/ML ~~LOC~~ SOLN
30.0000 [IU] | Freq: Every day | SUBCUTANEOUS | Status: DC
  Administered 2017-12-02: 30 [IU] via SUBCUTANEOUS
  Filled 2017-12-02: qty 0.3

## 2017-12-02 MED ORDER — MIDAZOLAM HCL 2 MG/2ML IJ SOLN
INTRAMUSCULAR | Status: AC
  Filled 2017-12-02: qty 2

## 2017-12-02 MED ORDER — ROCURONIUM BROMIDE 10 MG/ML (PF) SYRINGE
PREFILLED_SYRINGE | INTRAVENOUS | Status: DC | PRN
  Administered 2017-12-02: 50 mg via INTRAVENOUS

## 2017-12-02 MED ORDER — POTASSIUM CHLORIDE IN NACL 20-0.9 MEQ/L-% IV SOLN: INTRAVENOUS | Status: DC

## 2017-12-02 MED ORDER — PROPOFOL 10 MG/ML IV BOLUS
INTRAVENOUS | Status: DC | PRN
  Administered 2017-12-02: 160 mg via INTRAVENOUS

## 2017-12-02 MED ORDER — FENTANYL CITRATE (PF) 100 MCG/2ML IJ SOLN
25.0000 ug | INTRAMUSCULAR | Status: DC | PRN
  Administered 2017-12-02: 25 ug via INTRAVENOUS

## 2017-12-02 MED ORDER — KETOROLAC TROMETHAMINE 15 MG/ML IJ SOLN
7.5000 mg | Freq: Four times a day (QID) | INTRAMUSCULAR | Status: DC
  Administered 2017-12-02 – 2017-12-03 (×3): 7.5 mg via INTRAVENOUS
  Filled 2017-12-02 (×3): qty 1

## 2017-12-02 SURGICAL SUPPLY — 43 items
BLADE CLIPPER SURG (BLADE) IMPLANT
BUR ROUND FLUTED 5 RND (BURR) IMPLANT
CARTRIDGE OIL MAESTRO DRILL (MISCELLANEOUS) IMPLANT
DECANTER SPIKE VIAL GLASS SM (MISCELLANEOUS) ×2 IMPLANT
DERMABOND ADVANCED (GAUZE/BANDAGES/DRESSINGS) ×2
DERMABOND ADVANCED .7 DNX12 (GAUZE/BANDAGES/DRESSINGS) ×2 IMPLANT
DIFFUSER DRILL AIR PNEUMATIC (MISCELLANEOUS) IMPLANT
DRAPE C-ARM 42X72 X-RAY (DRAPES) IMPLANT
DRAPE C-ARMOR (DRAPES) IMPLANT
DRAPE ORTHO SPLIT 77X108 STRL (DRAPES) ×2
DRAPE POUCH INSTRU U-SHP 10X18 (DRAPES) IMPLANT
DRAPE SURG ORHT 6 SPLT 77X108 (DRAPES) ×2 IMPLANT
DURAPREP 26ML APPLICATOR (WOUND CARE) ×2 IMPLANT
GENERATOR NEUROSTIMULATOR (Neurostimulator) ×2 IMPLANT
GLOVE BIO SURGEON STRL SZ 6.5 (GLOVE) ×6 IMPLANT
GLOVE BIOGEL PI IND STRL 6.5 (GLOVE) ×2 IMPLANT
GLOVE BIOGEL PI INDICATOR 6.5 (GLOVE) ×2
GLOVE ECLIPSE 6.5 STRL STRAW (GLOVE) ×2 IMPLANT
GOWN STRL REUS W/ TWL LRG LVL3 (GOWN DISPOSABLE) ×3 IMPLANT
GOWN STRL REUS W/ TWL XL LVL3 (GOWN DISPOSABLE) IMPLANT
GOWN STRL REUS W/TWL 2XL LVL3 (GOWN DISPOSABLE) IMPLANT
GOWN STRL REUS W/TWL LRG LVL3 (GOWN DISPOSABLE) ×3
GOWN STRL REUS W/TWL XL LVL3 (GOWN DISPOSABLE)
KIT BASIN OR (CUSTOM PROCEDURE TRAY) ×2 IMPLANT
KIT CHARGING (KITS) ×1
KIT CHARGING PRECISION NEURO (KITS) ×1 IMPLANT
KIT REMOTE CONTROL 112802 FREE (KITS) ×2 IMPLANT
KIT TURNOVER KIT B (KITS) ×2 IMPLANT
NEEDLE HYPO 25X1 1.5 SAFETY (NEEDLE) ×2 IMPLANT
NEEDLE SPNL 22GX3.5 QUINCKE BK (NEEDLE) IMPLANT
NS IRRIG 1000ML POUR BTL (IV SOLUTION) ×2 IMPLANT
OIL CARTRIDGE MAESTRO DRILL (MISCELLANEOUS)
PACK LAMINECTOMY NEURO (CUSTOM PROCEDURE TRAY) ×2 IMPLANT
PAD ARMBOARD 7.5X6 YLW CONV (MISCELLANEOUS) ×6 IMPLANT
SPONGE SURGIFOAM ABS GEL SZ50 (HEMOSTASIS) ×2 IMPLANT
SUT VIC AB 0 CT1 18XCR BRD8 (SUTURE) ×1 IMPLANT
SUT VIC AB 0 CT1 8-18 (SUTURE) ×1
SUT VIC AB 2-0 CT1 18 (SUTURE) ×4 IMPLANT
SUT VIC AB 3-0 SH 8-18 (SUTURE) ×6 IMPLANT
TOOL LONG TUNNEL (SPINAL CORD STIMULATOR) ×2 IMPLANT
TOWEL GREEN STERILE (TOWEL DISPOSABLE) ×2 IMPLANT
TOWEL GREEN STERILE FF (TOWEL DISPOSABLE) ×2 IMPLANT
WATER STERILE IRR 1000ML POUR (IV SOLUTION) ×2 IMPLANT

## 2017-12-02 NOTE — Op Note (Signed)
**Note Amanda-Identified via Obfuscation** 12/02/2017  7:01 PM  PATIENT:  Amanda Morris  67 y.o. female  PRE-OPERATIVE DIAGNOSIS:  Postlaminectomy syndrome  POST-OPERATIVE DIAGNOSIS:  Postlaminectomy syndrome  PROCEDURE:  Procedure(s): LUMBAR SPINAL CORD STIMULATOR permanent INSERTION  SURGEON: Surgeon(s): Ashok Pall, MD  ASSISTANTS:none  ANESTHESIA:   none  EBL:  No intake/output data recorded.  BLOOD ADMINISTERED:none  CELL SAVER GIVEN:none  COUNT:per nursing  DRAINS: none   SPECIMEN:  No Specimen  DICTATION: [name] was taken to the operating room, intubated, and placed under a general anesthetic without difficulty. She was positioned prone on a jackson table. All pressure points were properly padded.Her thoracic and right flank was prepped and draped in a sterile manner. I removed the staples from the thoracic incision, and found the leads and the extension connectors. I disconnected the stimulator leads, cut off the connectors, and had an assistant pull externally to remove the external leads.  I then created a subcutaneous pocket in the right flank. I tunneled the leads from the thoracic region to the pocket. I connected the internal pulse generator to the leads. The connection was verified. I then placed the generator in the pocket, and closed the incision. I closed with vicryl sutures approximating the subcutaneous and subcuticular layers.  I then closed the thoracic incision with vicryl sutures in the same manner. I used dermabond to dress both incisions. I also closed the exit site for the external leads with vicryl suture and dermabond.  She was rolled onto the or stretcher, and extubated without incident.   PLAN OF CARE: Admit for overnight observation  PATIENT DISPOSITION:  PACU - hemodynamically stable.   Delay start of Pharmacological VTE agent (>24hrs) due to surgical blood loss or risk of bleeding:  yes

## 2017-12-02 NOTE — Anesthesia Postprocedure Evaluation (Signed)
Anesthesia Post Note  Patient: Amanda Morris  Procedure(s) Performed: LUMBAR SPINAL CORD STIMULATOR INSERTION (N/A Back)     Patient location during evaluation: PACU Anesthesia Type: General Level of consciousness: awake and alert Pain management: pain level controlled Vital Signs Assessment: post-procedure vital signs reviewed and stable Respiratory status: spontaneous breathing, nonlabored ventilation, respiratory function stable and patient connected to nasal cannula oxygen Cardiovascular status: blood pressure returned to baseline and stable Postop Assessment: no apparent nausea or vomiting Anesthetic complications: no    Last Vitals:  Vitals:   12/02/17 1942 12/02/17 2007  BP: 128/70 (!) 141/81  Pulse: 84 81  Resp: 20 18  Temp:  37.2 C  SpO2: 94% 100%    Last Pain:  Vitals:   12/02/17 2107  TempSrc:   PainSc: 7                  Cherry Wittwer DAVID

## 2017-12-02 NOTE — Anesthesia Preprocedure Evaluation (Addendum)
Anesthesia Evaluation  Patient identified by MRN, date of birth, ID band Patient awake    Reviewed: Allergy & Precautions, NPO status , Patient's Chart, lab work & pertinent test results  History of Anesthesia Complications Negative for: history of anesthetic complications  Airway Mallampati: IV  TM Distance: >3 FB Neck ROM: Full    Dental  (+) Edentulous Upper, Edentulous Lower   Pulmonary former smoker,    breath sounds clear to auscultation       Cardiovascular + Peripheral Vascular Disease   Rhythm:Regular Rate:Normal   '15 Carotid US - bilateral 40-59% ICAS    Neuro/Psych  Headaches,  Postlaminectomy syndrome Spinal cord stimulator  negative psych ROS   GI/Hepatic Neg liver ROS, hiatal hernia, GERD  Medicated and Controlled,  Endo/Other  diabetes, Type 2, Insulin Dependent, Oral Hypoglycemic AgentsMorbid obesity  Renal/GU negative Renal ROS     Musculoskeletal negative musculoskeletal ROS (+)   Abdominal (+) + obese,   Peds  Hematology negative hematology ROS (+)   Anesthesia Other Findings   Reproductive/Obstetrics                            Anesthesia Physical Anesthesia Plan  ASA: III  Anesthesia Plan: General   Post-op Pain Management:    Induction: Intravenous  PONV Risk Score and Plan: 3 and Treatment may vary due to age or medical condition, Ondansetron and Dexamethasone  Airway Management Planned: Oral ETT  Additional Equipment: None  Intra-op Plan:   Post-operative Plan: Extubation in OR  Informed Consent: I have reviewed the patients History and Physical, chart, labs and discussed the procedure including the risks, benefits and alternatives for the proposed anesthesia with the patient or authorized representative who has indicated his/her understanding and acceptance.     Plan Discussed with: CRNA and Anesthesiologist  Anesthesia Plan Comments:         Anesthesia Quick Evaluation

## 2017-12-02 NOTE — Transfer of Care (Signed)
Immediate Anesthesia Transfer of Care Note  Patient: Amanda Morris  Procedure(s) Performed: LUMBAR SPINAL CORD STIMULATOR INSERTION (N/A Back)  Patient Location: PACU  Anesthesia Type:General  Level of Consciousness: sedated  Airway & Oxygen Therapy: Patient connected to nasal cannula oxygen  Post-op Assessment: Report given to RN and Post -op Vital signs reviewed and stable  Post vital signs: Reviewed and stable  Last Vitals:  Vitals Value Taken Time  BP 114/69 12/02/2017  6:57 PM  Temp    Pulse 96 12/02/2017  6:58 PM  Resp 30 12/02/2017  6:59 PM  SpO2 93 % 12/02/2017  6:58 PM  Vitals shown include unvalidated device data.  Last Pain:  Vitals:   12/02/17 1331  TempSrc:   PainSc: 6       Patients Stated Pain Goal: 4 (90/47/53 3917)  Complications: No apparent anesthesia complications

## 2017-12-02 NOTE — Interval H&P Note (Signed)
**Note Amanda-Identified via Obfuscation** History and Physical Interval Note:  12/02/2017 4:48 PM  Amanda Morris  has presented today for surgery, she has been satisfied with the trial period with the spinal cord stimulator. With the diagnosis of Postlaminectomy syndrome, I feel she is an excellent candidate for the permanent placement. The various methods of treatment have been discussed with the patient and family. After consideration of risks, benefits and other options for treatment, the patient has consented to  Procedure(s) with comments: [place] (N/A) - [place] as a surgical intervention .  The patient's history has been reviewed, patient examined, no change in status, stable for surgery.  I have reviewed the patient's chart and labs.  Questions were answered to the patient's satisfaction.     Shayley Medlin L

## 2017-12-03 DIAGNOSIS — Z7951 Long term (current) use of inhaled steroids: Secondary | ICD-10-CM | POA: Diagnosis not present

## 2017-12-03 DIAGNOSIS — Z79899 Other long term (current) drug therapy: Secondary | ICD-10-CM | POA: Diagnosis not present

## 2017-12-03 DIAGNOSIS — M961 Postlaminectomy syndrome, not elsewhere classified: Secondary | ICD-10-CM | POA: Diagnosis not present

## 2017-12-03 DIAGNOSIS — E1151 Type 2 diabetes mellitus with diabetic peripheral angiopathy without gangrene: Secondary | ICD-10-CM | POA: Diagnosis not present

## 2017-12-03 DIAGNOSIS — E7800 Pure hypercholesterolemia, unspecified: Secondary | ICD-10-CM | POA: Diagnosis not present

## 2017-12-03 DIAGNOSIS — Z791 Long term (current) use of non-steroidal anti-inflammatories (NSAID): Secondary | ICD-10-CM | POA: Diagnosis not present

## 2017-12-03 DIAGNOSIS — K219 Gastro-esophageal reflux disease without esophagitis: Secondary | ICD-10-CM | POA: Diagnosis not present

## 2017-12-03 DIAGNOSIS — Z794 Long term (current) use of insulin: Secondary | ICD-10-CM | POA: Diagnosis not present

## 2017-12-03 DIAGNOSIS — Z87891 Personal history of nicotine dependence: Secondary | ICD-10-CM | POA: Diagnosis not present

## 2017-12-03 DIAGNOSIS — Z7982 Long term (current) use of aspirin: Secondary | ICD-10-CM | POA: Diagnosis not present

## 2017-12-03 LAB — GLUCOSE, CAPILLARY: Glucose-Capillary: 142 mg/dL — ABNORMAL HIGH (ref 70–99)

## 2017-12-03 MED ORDER — ASPIRIN EC 81 MG PO TBEC
81.0000 mg | DELAYED_RELEASE_TABLET | Freq: Every day | ORAL | Status: DC
Start: 2017-12-08 — End: 2024-02-21

## 2017-12-03 MED ORDER — TRAMADOL HCL 50 MG PO TABS: 50.0000 mg | ORAL_TABLET | Freq: Four times a day (QID) | ORAL | 0 refills | Status: DC | PRN

## 2017-12-03 NOTE — Progress Notes (Signed)
Patient alert and oriented, mae's well, voiding adequate amount of urine, swallowing without difficulty, no c/o pain at time of discharge. Patient discharged home with family. Script and discharged instructions given to patient. Patient and family stated understanding of instructions given. Patient has an appointment with Dr. Cabbell   

## 2017-12-03 NOTE — Discharge Summary (Signed)
Physician Discharge Summary  Patient ID: Amanda Morris MRN: [id number] DOB/AGE: [birth year] 67 y.o.  Admit date: 12/02/2017 Discharge date: 12/03/2017  Admission Diagnoses:  Failed back syndrome  Discharge Diagnoses:  Same Active Problems:   Failed back syndrome of lumbar spine   Discharged Condition: Stable  Hospital Course:  Amanda Morris is a 67 y.o. female who was admitted for the below procedure. There were no post operative complications. At time of discharge, pain was well controlled, ambulating with Pt/OT, tolerating po, voiding normal. Ready for discharge.  Treatments: Surgery - LUMBAR SPINAL CORD STIMULATOR permanent INSERTION  Discharge Exam: Blood pressure 113/74, pulse 70, temperature 98.2 F (36.8 C), temperature source Oral, resp. rate 18, height 5' 4"  (1.626 m), weight 101.6 kg, SpO2 98 %. Awake, alert, oriented Speech fluent, appropriate CN grossly intact 5/5 BUE/BLE Wound c/d/i  Disposition: Discharge disposition: 01-Home or Self Care       Discharge Instructions    Call MD for:  difficulty breathing, headache or visual disturbances   Complete by:  As directed    Call MD for:  persistant dizziness or light-headedness   Complete by:  As directed    Call MD for:  redness, tenderness, or signs of infection (pain, swelling, redness, odor or green/yellow discharge around incision site)   Complete by:  As directed    Call MD for:  severe uncontrolled pain   Complete by:  As directed    Call MD for:  temperature >100.4   Complete by:  As directed    Diet general   Complete by:  As directed    Driving Restrictions   Complete by:  As directed    Do not drive until given clearance.   Increase activity slowly   Complete by:  As directed    Lifting restrictions   Complete by:  As directed    Do not lift anything >10lbs. Avoid bending and twisting in awkward positions. Avoid bending at the back.   May shower / Bathe   Complete by:  As  directed    In 24 hours. Okay to wash wound with warm soapy water. Avoid scrubbing the wound. Pat dry.   Remove dressing in 24 hours   Complete by:  As directed      Allergies as of 12/03/2017      Reactions   Penicillins Hives, Other (See Comments)   PATIENT HAS HAD A PCN REACTION WITH IMMEDIATE RASH, FACIAL/TONGUE/THROAT SWELLING, SOB, OR LIGHTHEADEDNESS WITH HYPOTENSION:  #  #  YES  #  #  Has patient had a PCN reaction causing severe rash involving mucus membranes or skin necrosis: No Has patient had a PCN reaction that required hospitalization: No Has patient had a PCN reaction occurring within the last 10 years: No If all of the above answers are "NO", then may proceed with Cephalosporin use.   Aspirin Nausea Only   Codeine Itching   Hydrocodone Nausea Only   Metformin And Related Diarrhea, Nausea Only   Oxycodone Nausea And Vomiting   Victoza [liraglutide] Other (See Comments)   Abdominal pain      Medication List    TAKE these medications   ACCU-CHEK SOFTCLIX LANCETS lancets USE AS DIRECTED TO TEST BLOOD SUGAR 3 TIMES A DAY   ALEVE 220 MG tablet Generic drug:  naproxen sodium Take 220 mg by mouth 3 (three) times daily.   aspirin EC 81 MG tablet Take 1 tablet (81 mg total) by mouth daily. Start taking on:  12/08/2017 What changed:  These instructions start on 12/08/2017. If you are unsure what to do until then, ask your doctor or other care provider.   B-D ULTRAFINE III SHORT PEN 31G X 8 MM Misc Generic drug:  Insulin Pen Needle USE AS DIRECTED   blood glucose meter kit and supplies Kit Dispense glucometer, test strips, and lancets. Use to check blood sugar twice daily. Dx: E11.9   cholecalciferol 1000 units tablet Commonly known as:  VITAMIN D Take 1,000 Units by mouth daily.   DEXILANT 60 MG capsule Generic drug:  dexlansoprazole Take 60 mg by mouth daily.   dicyclomine 10 MG capsule Commonly known as:  BENTYL Take 10 mg by mouth 4 (four) times daily.    doxepin 10 MG capsule Commonly known as:  SINEQUAN Take 10 mg by mouth at bedtime.   famotidine 40 MG tablet Commonly known as:  PEPCID Take 40 mg by mouth every evening.   fluticasone 50 MCG/ACT nasal spray Commonly known as:  FLONASE Place 1 spray into both nostrils daily.   gabapentin 300 MG capsule Commonly known as:  NEURONTIN Take 1 capsule (300 mg total) by mouth 3 (three) times daily. MAX OF 3600MG/DAY What changed:  additional instructions   glipiZIDE 10 MG tablet Commonly known as:  GLUCOTROL TAKE 1 TABLET TWICE A DAY   glucose blood test strip For testing blood sugars 3 times daily. Dx:E11.9   Insulin Glargine 100 UNIT/ML Solostar Pen Commonly known as:  LANTUS Inject 36 Units into the skin daily at 10 pm. What changed:    how much to take  when to take this   JARDIANCE 25 MG Tabs tablet Generic drug:  empagliflozin TAKE 1 TABLET DAILY   levocetirizine 5 MG tablet Commonly known as:  XYZAL Take 1 tablet (5 mg total) by mouth every evening.   MULTIVITAMIN WOMEN 50+ PO Take 1 capsule by mouth daily.   NYQUIL SEVERE COLD/FLU PO Take 1 Dose by mouth at bedtime as needed (allergies).   perphenazine 8 MG tablet Commonly known as:  TRILAFON TAKE ONE TABLET (8 MG TOTAL) BY MOUTH AT BEDTIME.   pravastatin 40 MG tablet Commonly known as:  PRAVACHOL TAKE 1 TABLET BY MOUTH AT BEDTIME   traMADol 50 MG tablet Commonly known as:  ULTRAM Take 1 tablet (50 mg total) by mouth every 6 (six) hours as needed for moderate pain. What changed:  Another medication with the same name was added. Make sure you understand how and when to take each.   traMADol 50 MG tablet Commonly known as:  ULTRAM Take 1 tablet (50 mg total) by mouth every 6 (six) hours as needed. What changed:  You were already taking a medication with the same name, and this prescription was added. Make sure you understand how and when to take each.      Follow-up Information    Ashok Pall,  MD Follow up.   Specialty:  Neurosurgery Contact information: [address] [place] [address] [place] [address] [phone]           Signed: Traci Sermon 12/03/2017, 8:02 AM

## 2017-12-05 ENCOUNTER — Encounter (HOSPITAL_COMMUNITY): Payer: Self-pay | Admitting: Neurosurgery

## 2017-12-06 ENCOUNTER — Ambulatory Visit: Payer: Medicare Other | Admitting: Family Medicine

## 2017-12-29 ENCOUNTER — Other Ambulatory Visit: Payer: Self-pay | Admitting: *Deleted

## 2017-12-29 MED ORDER — INSULIN GLARGINE 100 UNIT/ML SOLOSTAR PEN: 36.0000 [IU] | PEN_INJECTOR | Freq: Every day | SUBCUTANEOUS | 99 refills | Status: DC

## 2018-01-17 ENCOUNTER — Ambulatory Visit: Payer: Medicare Other | Admitting: Family Medicine

## 2018-01-17 ENCOUNTER — Encounter: Payer: Self-pay | Admitting: Family Medicine

## 2018-01-17 VITALS — BP 119/75 | HR 72 | Ht 64.0 in | Wt 221.0 lb

## 2018-01-17 DIAGNOSIS — Z23 Encounter for immunization: Secondary | ICD-10-CM

## 2018-01-17 DIAGNOSIS — M961 Postlaminectomy syndrome, not elsewhere classified: Secondary | ICD-10-CM

## 2018-01-17 DIAGNOSIS — Z780 Asymptomatic menopausal state: Secondary | ICD-10-CM | POA: Diagnosis not present

## 2018-01-17 DIAGNOSIS — E119 Type 2 diabetes mellitus without complications: Secondary | ICD-10-CM

## 2018-01-17 DIAGNOSIS — K760 Fatty (change of) liver, not elsewhere classified: Secondary | ICD-10-CM

## 2018-01-17 DIAGNOSIS — I10 Essential (primary) hypertension: Secondary | ICD-10-CM

## 2018-01-17 MED ORDER — GLIPIZIDE 10 MG PO TABS: 10.0000 mg | ORAL_TABLET | Freq: Two times a day (BID) | ORAL | 1 refills | Status: DC

## 2018-01-17 NOTE — Progress Notes (Signed)
Subjective:    CC:   HPI:  Diabetes - no hypoglycemic events. No wounds or sores that are not healing well. No increased thirst or urination. Checking glucose at home. Taking medications as prescribed without any side effects.  Heme globin A1c was elevated at 7.3.  This was done during a recent hospital admission for a spinal stimulator placement.  She is currently on Jardiance 25 mg as well as glipizide and Lantus.  She is intolerant to metformin.  Hypertension- Pt denies chest pain, SOB, dizziness, or heart palpitations.  Taking meds as directed w/o problems.  Denies medication side effects.    Radiculopathy of the lumbar region/failed back surgery-she recently had a spinal stimulator placed.  She is also on gabapentin 300 mg 3 times a day for pain control.  She says she is actually doing really well and the stimulant her has made a huge difference in her pain control.   Past medical history, Surgical history, Family history not pertinant except as noted below, Social history, Allergies, and medications have been entered into the medical record, reviewed, and corrections made.   Review of Systems: No fevers, chills, night sweats, weight loss, chest pain, or shortness of breath.   Objective:    General: Well Developed, well nourished, and in no acute distress.  Neuro: Alert and oriented x3, extra-ocular muscles intact, sensation grossly intact.  HEENT: Normocephalic, atraumatic  Skin: Warm and dry, no rashes. Cardiac: Regular rate and rhythm, no murmurs rubs or gallops, no lower extremity edema.  Respiratory: Clear to auscultation bilaterally. Not using accessory muscles, speaking in full sentences.   Impression and Recommendations:    DM -uncontrolled.  Her A1c is up from 6.8 to now 7.3.  Though on her blood glucose log that she brought from home her sugars look great in fact she is had a couple of low blood sugars first thing in the morning though she states asymptomatic when it  happens some not going to increase her insulin today based on those numbers.  Is on a statin.  Due for updated lipid panel.  Plan to follow-up in 3 months.  HTN -  Well controlled. Continue current regimen. Follow up in  3 months.    Fatty liver-due to repeat liver enzymes.  We will try to monitor this carefully.  Back surgery-doing really well with her spinal stimulator.  Consider starting to wean her gabapentin.  May want to discuss with her pain management doctor.

## 2018-01-17 NOTE — Addendum Note (Signed)
Addended by: Teddy Spike on: 01/17/2018 10:15 AM   Modules accepted: Orders

## 2018-01-18 LAB — COMPLETE METABOLIC PANEL WITH GFR
AG Ratio: 1.3 (calc) (ref 1.0–2.5)
ALT: 12 U/L (ref 6–29)
AST: 20 U/L (ref 10–35)
Albumin: 4 g/dL (ref 3.6–5.1)
Alkaline phosphatase (APISO): 85 U/L (ref 33–130)
BUN: 13 mg/dL (ref 7–25)
CO2: 25 mmol/L (ref 20–32)
Calcium: 9.1 mg/dL (ref 8.6–10.4)
Chloride: 107 mmol/L (ref 98–110)
Creat: 0.78 mg/dL (ref 0.50–0.99)
GFR, Est African American: 92 mL/min/{1.73_m2} (ref >=60)
GFR, Est Non African American: 80 mL/min/{1.73_m2} (ref >=60)
Globulin: 3 g/dL (calc) (ref 1.9–3.7)
Glucose, Bld: 87 mg/dL (ref 65–139)
Potassium: 4.2 mmol/L (ref 3.5–5.3)
Sodium: 143 mmol/L (ref 135–146)
Total Bilirubin: 0.3 mg/dL (ref 0.2–1.2)
Total Protein: 7 g/dL (ref 6.1–8.1)

## 2018-01-18 LAB — LIPID PANEL
Cholesterol: 157 mg/dL (ref <200)
HDL: 43 mg/dL — ABNORMAL LOW (ref >50)
LDL Cholesterol (Calc): 93 mg/dL (calc)
Non-HDL Cholesterol (Calc): 114 mg/dL (calc) (ref <130)
Total CHOL/HDL Ratio: 3.7 (calc) (ref <5.0)
Triglycerides: 112 mg/dL (ref <150)

## 2018-01-18 NOTE — Progress Notes (Signed)
All labs are normal. 

## 2018-03-20 ENCOUNTER — Telehealth: Payer: Self-pay

## 2018-03-20 NOTE — Telephone Encounter (Signed)
Called patient and she will pick up the 3 boxes that were dropped of this morning on 03/21/2018. Samples are placed up front and front staff aware.   NDC: 8032-1224-82 EXP:03/26/2019.

## 2018-03-20 NOTE — Telephone Encounter (Signed)
Attempted to call patient and the prompt states " I'm sorry the person you are trying to reach has a VM that has not been set up". Will try again later.

## 2018-03-20 NOTE — Telephone Encounter (Signed)
Amanda Morris called and stated she needed a refill on Januvia. I advised her that Januvia was not on her current medication list. I also advised she should be on Jardiance. She states the pharmacy states she has no refills. She should have 2 more refills. I called the pharmacy and they stated she picked up a ninety day supply on 02/07/2018. I called patient back and she states she doesn't have the prescription. She states never mind she just won't take the medication. I tried to ask if it was just misplace and she hung up the phone.    Barnet Pall would you call the company that manufactures Jardiance to see if we can get samples.   Thank you.

## 2018-03-29 ENCOUNTER — Ambulatory Visit (INDEPENDENT_AMBULATORY_CARE_PROVIDER_SITE_OTHER): Payer: Medicare Other

## 2018-03-29 DIAGNOSIS — Z13820 Encounter for screening for osteoporosis: Secondary | ICD-10-CM | POA: Diagnosis not present

## 2018-03-29 DIAGNOSIS — Z780 Asymptomatic menopausal state: Secondary | ICD-10-CM

## 2018-04-07 ENCOUNTER — Other Ambulatory Visit: Payer: Self-pay | Admitting: Family Medicine

## 2018-04-10 ENCOUNTER — Ambulatory Visit: Payer: Medicare Other | Admitting: Family Medicine

## 2018-04-10 ENCOUNTER — Encounter: Payer: Self-pay | Admitting: Family Medicine

## 2018-04-10 VITALS — BP 133/72 | HR 78 | Ht 64.0 in | Wt 223.0 lb

## 2018-04-10 DIAGNOSIS — I10 Essential (primary) hypertension: Secondary | ICD-10-CM

## 2018-04-10 DIAGNOSIS — M25531 Pain in right wrist: Secondary | ICD-10-CM | POA: Diagnosis not present

## 2018-04-10 DIAGNOSIS — N760 Acute vaginitis: Secondary | ICD-10-CM

## 2018-04-10 DIAGNOSIS — R109 Unspecified abdominal pain: Secondary | ICD-10-CM

## 2018-04-10 DIAGNOSIS — E119 Type 2 diabetes mellitus without complications: Secondary | ICD-10-CM | POA: Diagnosis not present

## 2018-04-10 LAB — POCT GLYCOSYLATED HEMOGLOBIN (HGB A1C): Hemoglobin A1C: 6.7 % — AB (ref 4.0–5.6)

## 2018-04-10 MED ORDER — FLUCONAZOLE 150 MG PO TABS: 150.0000 mg | ORAL_TABLET | ORAL | 0 refills | Status: DC

## 2018-04-10 MED ORDER — EMPAGLIFLOZIN 25 MG PO TABS: 25.0000 mg | ORAL_TABLET | Freq: Every day | ORAL | 1 refills | Status: DC

## 2018-04-10 NOTE — Progress Notes (Signed)
Subjective:    CC: DM  HPI:  Diabetes - no hypoglycemic events. No wounds or sores that are not healing well. No increased thirst or urination. Checking glucose at home. Taking medications as prescribed without any side effects. 7 days [address].  Currently using 36 units of Lantus.  Hypertension- Pt denies chest pain, SOB, dizziness, or heart palpitations.  Taking meds as directed w/o problems.  Denies medication side effects.    She is also been having intermittent episodes of abdominal pain happens maybe once or twice a week where she will get intense cramping of the stomach followed by diarrhea most times.  She says is really been going on for months maybe since the summer.  She has not noticed any blood in the stool.  No major dietary changes.  No major medication changes.  Denies family history of autoimmune GI disease such as Crohn's etc.  She is also felt a knot on her right wrist just below the thumb.  She says it is been painful and bothersome.  She feels like the wrist gets tight at times and it is hard to flex it.  She does exercise regularly.  Is not member any specific injury or trauma.  She also feels like she has a yeast infection she is been having some vaginal itching and irritation.  She is been using over-the-counter vaginocele feminine anti-itch cream.  But it just does not seem to be getting better on its own.  Past medical history, Surgical history, Family history not pertinant except as noted below, Social history, Allergies, and medications have been entered into the medical record, reviewed, and corrections made.   Review of Systems: No fevers, chills, night sweats, weight loss, chest pain, or shortness of breath.   Objective:    General: Well Developed, well nourished, and in no acute distress.  Neuro: Alert and oriented x3, extra-ocular muscles intact, sensation grossly intact.  HEENT: Normocephalic, atraumatic  Skin: Warm and dry, no rashes. Cardiac: Regular rate  and rhythm, no murmurs rubs or gallops, no lower extremity edema.  Respiratory: Clear to auscultation bilaterally. Not using accessory muscles, speaking in full sentences. Abd: soft, nontender, dec bowel sounds.    Impression and Recommendations:    DM - Well controlled.  She has had a few lows on her glucose logs we discussed possibly dropping her insulin down to 34 units from 36 units.  Continue current regimen. Follow up in  3 months.    HTN - Well controlled. Continue current regimen. Follow up in 3 months.    Vaginitis-likely yeast based on her description since she already has applied some cream today we did not obtain a swab.  We will go ahead and treat with oral Diflucan if not better than she will need to come in for swab.  Cyst on the tendon at the right wrist at the base of the thumb.  We will get her in with Dr. Dianah Field or Dr. Georgina Snell for further work-up and evaluation.  The wrist is actually quite swollen in that area as well.  Abdominal pain/cramping with intermittent diarrhea-unclear etiology.  Could be medication side effect but she is no longer on metformin.  Trial of a probiotic.

## 2018-04-10 NOTE — Patient Instructions (Signed)
Commend of a trial of a probiotic such as Culturelle, or align.  These can be found at the pharmacy or grocery store.  Recommend trial treatment for about 3 weeks to see if it helps with the abdominal cramping and pain.  If not then please let me know and will work-up further.

## 2018-04-11 ENCOUNTER — Ambulatory Visit: Payer: Medicare Other | Admitting: Family Medicine

## 2018-04-11 VITALS — BP 130/69 | HR 79 | Ht 63.0 in | Wt 220.0 lb

## 2018-04-11 DIAGNOSIS — M654 Radial styloid tenosynovitis [de Quervain]: Secondary | ICD-10-CM

## 2018-04-11 DIAGNOSIS — M6740 Ganglion, unspecified site: Secondary | ICD-10-CM | POA: Diagnosis not present

## 2018-04-11 NOTE — Progress Notes (Signed)
Amanda Morris is a 67 y.o. female who presents to [facility] today for right wrist pain. Amanda Morris had back surgery on 12/02/2017 and has had right wrist pain since they inserted her IV prior to surgery. 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 She is starting to drop things like pots and pans. She has numbness and tingling around her wrist and down her thumb. She has normal sensation.   Since the pain began in August, she has tried Aleve daily according to the bottle directions, without relief. She has not tried ice, heat, or brace.   ROS:  As above  Exam:  BP 130/69   Pulse 79   Ht 5' 3"  (1.6 m)   Wt 220 lb (99.8 kg)   BMI 38.97 kg/m  General: Well Developed, well nourished, and in no acute distress.  Neuro/Psych: Alert and oriented x3, extra-ocular muscles intact, able to move all 4 extremities, sensation grossly intact. Skin: Warm and dry, no rashes noted.  Respiratory: Not using accessory muscles, speaking in full sentences, trachea midline.  Cardiovascular: Pulses palpable, no extremity edema. Abdomen: Does not appear distended. MSK:  Right wrist: Normal-appearing. Tender to palpation over radial styloid.  Not especially tender in anatomical snuffbox. Palpable mobile 2 mm nodule present at radial styloid.  Consistent with ganglion cyst.  Tender to palpation. Limited ROM due to pain.   Limited flexion, ulnar deviation, and radial deviation due to pain. Decreased grip strength. Decreased strength of finger abduction.  Pulses and capillary refill normal. Positive Finkelstein's test  Left wrist: Normal-appearing, no tenderness to palpation, normal ROM, 5/5 strength. Pulses and capillary refill normal.     Lab and Radiology Results Limited musculoskeletal ultrasound right  radial wrist reveals a small hypoechoic cystic structure superficial to the first dorsal wrist compartment consistent in appearance with ganglion cyst.  The dorsal wrist compartment she has hypoechoic fluid tracking with intact appearing tendons.  This is consistent with ganglion cyst and de Quervain's tenosynovitis.  Normal bony structures.  Procedure: Real-time Ultrasound Guided Injection of right radial wrist ganglion cyst and de Quervain's injection Device: GE Logiq E   Images permanently stored and available for review in the ultrasound unit. Verbal informed consent obtained.  Discussed risks and benefits of procedure. Warned about infection bleeding damage to structures skin hypopigmentation and fat atrophy among others. Patient expresses understanding and agreement Time-out conducted.   Noted no overlying erythema, induration, or other signs of local infection.   Skin prepped in a sterile fashion.   Local anesthesia: Topical Ethyl chloride.   With sterile technique and under real time ultrasound guidance:  40 mg of Depo-Medrol and 1.5 mL of Marcaine injected easily.   Completed without difficulty   Pain immediately resolved suggesting accurate placement of the medication.   Advised to call if fevers/chills, erythema, induration, drainage, or persistent bleeding.   Images permanently stored and available for review in the ultrasound unit.  Impression: Technically successful ultrasound guided injection.         Assessment and Plan: 67 y.o. female with  Right radial wrist pain following IV injection.  Exam history and ultrasound are consistent with ganglion cyst and de Quervain's tenosynovitis.  Symptoms have been ongoing now for quite some time.  Plan for treatment with thumb spica splint, and injection  as above.  We will proceed with home exercise program as well.  Recheck if not improving in the near future.  Return sooner if needed.     Historical information moved to improve  visibility of documentation.  Past Medical History:  Diagnosis Date  . Allergy   . Diabetes mellitus without complication ([place])   . GERD (gastroesophageal reflux disease)   . Headache    migraines last one 2 weeks ago  . History of hiatal hernia   . Hypercholesterolemia   . Palpitations    in the past, no current issues per patient  . Postlaminectomy syndrome    Past Surgical History:  Procedure Laterality Date  . ABDOMINAL HYSTERECTOMY  1982  . BACK SURGERY    . BREAST REDUCTION SURGERY Bilateral 03/17/2016   Procedure: MAMMARY REDUCTION  (BREAST);  Surgeon: Wallace Going, DO;  Location: [facility];  Service: Plastics;  Laterality: Bilateral;  . SHOULDER SURGERY  06/2008, 09/2010   Right, by Dr. Karie Soda, then Dr. Judeth Horn  . SPINAL CORD STIMULATOR INSERTION N/A 12/02/2017   Procedure: LUMBAR SPINAL CORD STIMULATOR INSERTION;  Surgeon: Ashok Pall, MD;  Location: [facility];  Service: Neurosurgery;  Laterality: N/A;  . SPINAL CORD STIMULATOR TRIAL N/A 11/25/2017   Procedure: INSERTION LUMBAR SPINAL CORD STIMULATOR TRIAL  ;  Surgeon: Ashok Pall, MD;  Location: [facility];  Service: Neurosurgery;  Laterality: N/A;   INSERTION LUMBAR SPINAL CORD STIMULATOR TRIAL     Social History   Tobacco Use  . Smoking status: Former Smoker    Types: Cigarettes    Last attempt to quit: 07/30/2007    Years since quitting: 10.7  . Smokeless tobacco: Never Used  Substance Use Topics  . Alcohol use: No   family history includes Alcohol abuse in her brother; Diabetes in her mother and sister; Heart disease in her father, mother, and sister; Hyperlipidemia in her brother and sister; Hypertension in her mother and sister; Stroke in her mother and sister.  Medications: Current Outpatient Medications  Medication Sig Dispense Refill  . ACCU-CHEK SOFTCLIX LANCETS lancets USE AS DIRECTED TO TEST BLOOD SUGAR 3 TIMES A DAY 100 each 10  . aspirin EC 81 MG tablet Take 1 tablet (81 mg total)  by mouth daily.    . B-D ULTRAFINE III SHORT PEN 31G X 8 MM MISC USE AS DIRECTED 100 each 6  . blood glucose meter kit and supplies KIT Dispense glucometer, test strips, and lancets. Use to check blood sugar twice daily. Dx: E11.9 1 each 0  . cholecalciferol (VITAMIN D) 1000 units tablet Take 1,000 Units by mouth daily.    Marland Kitchen DEXILANT 60 MG capsule Take 60 mg by mouth daily.   3  . dicyclomine (BENTYL) 10 MG capsule Take 10 mg by mouth 4 (four) times daily.     Marland Kitchen doxepin (SINEQUAN) 10 MG capsule Take 10 mg by mouth at bedtime.    . empagliflozin (JARDIANCE) 25 MG TABS tablet Take 25 mg by mouth daily. 90 tablet 1  . famotidine (PEPCID) 40 MG tablet Take 40 mg by mouth every evening.  5  . fluconazole (DIFLUCAN) 150 MG tablet Take 1 tablet (150 mg total) by mouth every 3 (three) days. 2 tablet 0  . fluticasone (FLONASE) 50 MCG/ACT nasal spray Place 1 spray into both nostrils daily. 16 g 6  . gabapentin (NEURONTIN) 300 MG capsule Take 1 capsule (300 mg total) by mouth 3 (three) times daily. MAX OF '3600MG'$ /DAY (Patient taking differently:  Take 300 mg by mouth 3 (three) times daily. ) 180 capsule 3  . glipiZIDE (GLUCOTROL) 10 MG tablet Take 1 tablet (10 mg total) by mouth 2 (two) times daily. 180 tablet 1  . glucose blood (ACCU-CHEK AVIVA PLUS) test strip For testing blood sugars 3 times daily. Dx:E11.9 300 each 12  . Insulin Glargine (LANTUS SOLOSTAR) 100 UNIT/ML Solostar Pen Inject 36 Units into the skin daily at 10 pm. 5 pen PRN  . levocetirizine (XYZAL) 5 MG tablet Take 1 tablet (5 mg total) by mouth every evening. 90 tablet 3  . Multiple Vitamins-Minerals (MULTIVITAMIN WOMEN 50+ PO) Take 1 capsule by mouth daily.    . naproxen sodium (ALEVE) 220 MG tablet Take 220 mg by mouth 3 (three) times daily.     Marland Kitchen perphenazine (TRILAFON) 8 MG tablet TAKE ONE TABLET (8 MG TOTAL) BY MOUTH AT BEDTIME.  2  . pravastatin (PRAVACHOL) 40 MG tablet TAKE 1 TABLET BY MOUTH AT BEDTIME 90 tablet 3  . traMADol (ULTRAM)  50 MG tablet Take 1 tablet (50 mg total) by mouth every 6 (six) hours as needed. 60 tablet 0   No current facility-administered medications for this visit.    Allergies  Allergen Reactions  . Penicillins Hives and Other (See Comments)    PATIENT HAS HAD A PCN REACTION WITH IMMEDIATE RASH, FACIAL/TONGUE/THROAT SWELLING, SOB, OR LIGHTHEADEDNESS WITH HYPOTENSION:  #  #  YES  #  #  Has patient had a PCN reaction causing severe rash involving mucus membranes or skin necrosis: No Has patient had a PCN reaction that required hospitalization: No Has patient had a PCN reaction occurring within the last 10 years: No If all of the above answers are "NO", then may proceed with Cephalosporin use.   . Aspirin Nausea Only  . Codeine Itching  . Hydrocodone Nausea Only  . Metformin And Related Diarrhea and Nausea Only  . Oxycodone Nausea And Vomiting    Other reaction(s): GI Upset (intolerance)  . Victoza [Liraglutide] Other (See Comments)    Abdominal pain      Discussed warning signs or symptoms. Please see discharge instructions. Patient expresses understanding.  I personally was present and performed or re-performed the history, physical exam and medical decision-making activities of this service and have verified that the service and findings are accurately documented in the student's note. ___________________________________________ Lynne Leader M.D., ABFM., CAQSM. Primary Care and Sports Medicine Adjunct Instructor of [employer]

## 2018-04-11 NOTE — Patient Instructions (Signed)
Thank you for coming in today. Use the brace with heavy activity for 1-2 weeks and then as needed.  Call or go to the ER if you develop a large red swollen joint with extreme pain or oozing puss.  Use over the counter aspercream.  OK to take medicine as needed for pain.   Do some exercises.    Ganglion Cyst A ganglion cyst is a noncancerous, fluid-filled lump that occurs near joints or tendons. The ganglion cyst grows out of a joint or the lining of a tendon. It most often develops in the hand or wrist, but it can also develop in the shoulder, elbow, hip, knee, ankle, or foot. The round or oval ganglion cyst can be the size of a pea or larger than a grape. Increased activity may enlarge the size of the cyst because more fluid starts to build up. What are the causes? It is not known what causes a ganglion cyst to grow. However, it may be related to:  Inflammation or irritation around the joint.  An injury.  Repetitive movements or overuse.  Arthritis.  What increases the risk? Risk factors include:  Being a woman.  Being age 94-50.  What are the signs or symptoms? Symptoms may include:  A lump. This most often appears on the hand or wrist, but it can occur in other areas of the body.  Tingling.  Pain.  Numbness.  Muscle weakness.  Weak grip.  Less movement in a joint.  How is this diagnosed? Ganglion cysts are most often diagnosed based on a physical exam. Your health care provider will feel the lump and may shine a light alongside it. If it is a ganglion cyst, a light often shines through it. Your health care provider may order an X-ray, ultrasound, or MRI to rule out other conditions. How is this treated? Ganglion cysts usually go away on their own without treatment. If pain or other symptoms are involved, treatment may be needed. Treatment is also needed if the ganglion cyst limits your movement or if it gets infected. Treatment may include:  Wearing a brace or  splint on your wrist or finger.  Taking anti-inflammatory medicine.  Draining fluid from the lump with a needle (aspiration).  Injecting a steroid into the joint.  Surgery to remove the ganglion cyst.  Follow these instructions at home:  Do not press on the ganglion cyst, poke it with a needle, or hit it.  Take medicines only as directed by your health care provider.  Wear your brace or splint as directed by your health care provider.  Watch your ganglion cyst for any changes.  Keep all follow-up visits as directed by your health care provider. This is important. Contact a health care provider if:  Your ganglion cyst becomes larger or more painful.  You have increased redness, red streaks, or swelling.  You have pus coming from the lump.  You have weakness or numbness in the affected area.  You have a fever or chills. This information is not intended to replace advice given to you by your health care provider. Make sure you discuss any questions you have with your health care provider. Document Released: 04/09/2000 Document Revised: 09/18/2015 Document Reviewed: 09/25/2013 Elsevier Interactive Patient Education  2018 [employer].

## 2018-05-04 DIAGNOSIS — K219 Gastro-esophageal reflux disease without esophagitis: Secondary | ICD-10-CM | POA: Diagnosis not present

## 2018-05-04 DIAGNOSIS — R1084 Generalized abdominal pain: Secondary | ICD-10-CM | POA: Diagnosis not present

## 2018-05-04 DIAGNOSIS — K589 Irritable bowel syndrome without diarrhea: Secondary | ICD-10-CM | POA: Diagnosis not present

## 2018-05-04 DIAGNOSIS — K641 Second degree hemorrhoids: Secondary | ICD-10-CM | POA: Diagnosis not present

## 2018-05-08 ENCOUNTER — Other Ambulatory Visit: Payer: Self-pay | Admitting: Family Medicine

## 2018-06-15 ENCOUNTER — Other Ambulatory Visit: Payer: Self-pay | Admitting: Family Medicine

## 2018-06-19 DIAGNOSIS — M961 Postlaminectomy syndrome, not elsewhere classified: Secondary | ICD-10-CM | POA: Diagnosis not present

## 2018-06-22 ENCOUNTER — Other Ambulatory Visit: Payer: Self-pay | Admitting: Neurosurgery

## 2018-06-22 DIAGNOSIS — M961 Postlaminectomy syndrome, not elsewhere classified: Secondary | ICD-10-CM

## 2018-06-27 ENCOUNTER — Ambulatory Visit (INDEPENDENT_AMBULATORY_CARE_PROVIDER_SITE_OTHER): Payer: Medicare Other

## 2018-06-27 DIAGNOSIS — R918 Other nonspecific abnormal finding of lung field: Secondary | ICD-10-CM | POA: Diagnosis not present

## 2018-06-27 DIAGNOSIS — M961 Postlaminectomy syndrome, not elsewhere classified: Secondary | ICD-10-CM

## 2018-06-27 DIAGNOSIS — R911 Solitary pulmonary nodule: Secondary | ICD-10-CM | POA: Diagnosis not present

## 2018-07-11 ENCOUNTER — Encounter: Payer: Self-pay | Admitting: Family Medicine

## 2018-07-11 ENCOUNTER — Other Ambulatory Visit: Payer: Self-pay | Admitting: Family Medicine

## 2018-07-11 ENCOUNTER — Ambulatory Visit: Payer: Medicare Other | Admitting: Family Medicine

## 2018-07-11 ENCOUNTER — Ambulatory Visit (INDEPENDENT_AMBULATORY_CARE_PROVIDER_SITE_OTHER): Payer: Medicare Other | Admitting: *Deleted

## 2018-07-11 ENCOUNTER — Other Ambulatory Visit: Payer: Self-pay

## 2018-07-11 VITALS — BP 124/71 | HR 88 | Ht 63.0 in | Wt 225.0 lb

## 2018-07-11 VITALS — BP 139/76 | HR 86 | Ht 63.0 in | Wt 225.0 lb

## 2018-07-11 VITALS — BP 124/71 | HR 88 | Temp 98.6°F | Wt 225.0 lb

## 2018-07-11 DIAGNOSIS — Z0000 Encounter for general adult medical examination without abnormal findings: Secondary | ICD-10-CM

## 2018-07-11 DIAGNOSIS — G5712 Meralgia paresthetica, left lower limb: Secondary | ICD-10-CM | POA: Diagnosis not present

## 2018-07-11 DIAGNOSIS — G609 Hereditary and idiopathic neuropathy, unspecified: Secondary | ICD-10-CM

## 2018-07-11 DIAGNOSIS — E119 Type 2 diabetes mellitus without complications: Secondary | ICD-10-CM | POA: Diagnosis not present

## 2018-07-11 DIAGNOSIS — I10 Essential (primary) hypertension: Secondary | ICD-10-CM

## 2018-07-11 DIAGNOSIS — M7062 Trochanteric bursitis, left hip: Secondary | ICD-10-CM | POA: Diagnosis not present

## 2018-07-11 LAB — POCT GLYCOSYLATED HEMOGLOBIN (HGB A1C): Hemoglobin A1C: 6.8 % — AB (ref 4.0–5.6)

## 2018-07-11 LAB — POCT UA - MICROALBUMIN
Albumin/Creatinine Ratio, Urine, POC: <30
Creatinine, POC: 100 mg/dL
Microalbumin Ur, POC: 30 mg/L

## 2018-07-11 MED ORDER — PRAVASTATIN SODIUM 40 MG PO TABS: ORAL_TABLET | ORAL | 3 refills | Status: DC

## 2018-07-11 MED ORDER — GLIPIZIDE 10 MG PO TABS: 10.0000 mg | ORAL_TABLET | Freq: Two times a day (BID) | ORAL | 1 refills | Status: DC

## 2018-07-11 NOTE — Patient Instructions (Signed)
Amanda Morris , Thank you for taking time to come for your Medicare Wellness Visit. I appreciate your ongoing commitment to your health goals. Please review the following plan we discussed and let me know if I can assist you in the future.   Please schedule your next medicare wellness visit with me in 1 yr. Continue doing brain stimulating activities (puzzles, reading, adult coloring books, staying active) to [facility] sharp  Bring a copy of your living will and/or healthcare power of attorney to your next office visit.   These are the goals we discussed: Goals     Weight (lb) < 200 lb (90.7 kg)     Wants to lose 30 - 50 lbs in the year 2020       Diabetes Mellitus and Nutrition, Adult When you have diabetes (diabetes mellitus), it is very important to have healthy eating habits because your blood sugar (glucose) levels are greatly affected by what you eat and drink. Eating healthy foods in the appropriate amounts, at about the same times every day, can help you:  Control your blood glucose.  Lower your risk of heart disease.  Improve your blood pressure.  Reach or maintain a healthy weight. Every person with diabetes is different, and each person has different needs for a meal plan. Your health care provider may recommend that you work with a diet and nutrition specialist (dietitian) to make a meal plan that is best for you. Your meal plan may vary depending on factors such as:  The calories you need.  The medicines you take.  Your weight.  Your blood glucose, blood pressure, and cholesterol levels.  Your activity level.  Other health conditions you have, such as heart or kidney disease. How do carbohydrates affect me? Carbohydrates, also called carbs, affect your blood glucose level more than any other type of food. Eating carbs naturally raises the amount of glucose in your blood. Carb counting is a method for keeping track of how many carbs you eat. Counting carbs is  important to keep your blood glucose at a healthy level, especially if you use insulin or take certain oral diabetes medicines. It is important to know how many carbs you can safely have in each meal. This is different for every person. Your dietitian can help you calculate how many carbs you should have at each meal and for each snack. Foods that contain carbs include:  Bread, cereal, rice, pasta, and crackers.  Potatoes and corn.  Peas, beans, and lentils.  Milk and yogurt.  Fruit and juice.  Desserts, such as cakes, cookies, ice cream, and candy. How does alcohol affect me? Alcohol can cause a sudden decrease in blood glucose (hypoglycemia), especially if you use insulin or take certain oral diabetes medicines. Hypoglycemia can be a life-threatening condition. Symptoms of hypoglycemia (sleepiness, dizziness, and confusion) are similar to symptoms of having too much alcohol. If your health care provider says that alcohol is safe for you, follow these guidelines:  Limit alcohol intake to no more than 1 drink per day for nonpregnant women and 2 drinks per day for men. One drink equals 12 oz of beer, 5 oz of wine, or 1 oz of hard liquor.  Do not drink on an empty stomach.  Keep yourself hydrated with water, diet soda, or unsweetened iced tea.  Keep in mind that regular soda, juice, and other mixers may contain a lot of sugar and must be counted as carbs. What are tips for following this plan?  Reading food labels  Start by checking the serving size on the "Nutrition Facts" label of packaged foods and drinks. The amount of calories, carbs, fats, and other nutrients listed on the label is based on one serving of the item. Many items contain more than one serving per package.  Check the total grams (g) of carbs in one serving. You can calculate the number of servings of carbs in one serving by dividing the total carbs by 15. For example, if a food has 30 g of total carbs, it would be  equal to 2 servings of carbs.  Check the number of grams (g) of saturated and trans fats in one serving. Choose foods that have low or no amount of these fats.  Check the number of milligrams (mg) of salt (sodium) in one serving. Most people should limit total sodium intake to less than 2,300 mg per day.  Always check the nutrition information of foods labeled as "low-fat" or "nonfat". These foods may be higher in added sugar or refined carbs and should be avoided.  Talk to your dietitian to identify your daily goals for nutrients listed on the label. Shopping  Avoid buying canned, premade, or processed foods. These foods tend to be high in fat, sodium, and added sugar.  Shop around the outside edge of the grocery store. This includes fresh fruits and vegetables, bulk grains, fresh meats, and fresh dairy. Cooking  Use low-heat cooking methods, such as baking, instead of high-heat cooking methods like deep frying.  Cook using healthy oils, such as olive, canola, or sunflower oil.  Avoid cooking with butter, cream, or high-fat meats. Meal planning  Eat meals and snacks regularly, preferably at the same times every day. Avoid going long periods of time without eating.  Eat foods high in fiber, such as fresh fruits, vegetables, beans, and whole grains. Talk to your dietitian about how many servings of carbs you can eat at each meal.  Eat 4-6 ounces (oz) of lean protein each day, such as lean meat, chicken, fish, eggs, or tofu. One oz of lean protein is equal to: ? 1 oz of meat, chicken, or fish. ? 1 egg. ?  cup of tofu.  Eat some foods each day that contain healthy fats, such as avocado, nuts, seeds, and fish. Lifestyle  Check your blood glucose regularly.  Exercise regularly as told by your health care provider. This may include: ? 150 minutes of moderate-intensity or vigorous-intensity exercise each week. This could be brisk walking, biking, or water aerobics. ? Stretching and  doing strength exercises, such as yoga or weightlifting, at least 2 times a week.  Take medicines as told by your health care provider.  Do not use any products that contain nicotine or tobacco, such as cigarettes and e-cigarettes. If you need help quitting, ask your health care provider.  Work with a Social worker or diabetes educator to identify strategies to manage stress and any emotional and social challenges. Questions to ask a health care provider  Do I need to meet with a diabetes educator?  Do I need to meet with a dietitian?  What number can I call if I have questions?  When are the best times to check my blood glucose? Where to find more information:  American Diabetes Association: diabetes.org  Academy of Nutrition and Dietetics: www.eatright.[employer] of Diabetes and Digestive and Kidney Diseases (NIH): [employer] Summary  A healthy meal plan will help you control your blood glucose and maintain a healthy  lifestyle.  Working with a diet and nutrition specialist (dietitian) can help you make a meal plan that is best for you.  Keep in mind that carbohydrates (carbs) and alcohol have immediate effects on your blood glucose levels. It is important to count carbs and to use alcohol carefully. This information is not intended to replace advice given to you by your health care provider. Make sure you discuss any questions you have with your health care provider. Document Released: 01/07/2005 Document Revised: 11/10/2016 Document Reviewed: 05/17/2016 Elsevier Interactive Patient Education  2019 [employer].

## 2018-07-11 NOTE — Patient Instructions (Signed)
Continue current regimen

## 2018-07-11 NOTE — Patient Instructions (Signed)
Thank you for coming in today. Call or go to the ER if you develop a large red swollen joint with extreme pain or oozing puss.  Work on the side leg raises.  Do the cross over stretch  Do about 10-30 side leg raises 2x a daily.  Recheck in 1 month.  Return sooner if needed.

## 2018-07-11 NOTE — Progress Notes (Signed)
Subjective:   Amanda Morris is a 68 y.o. female who presents for Medicare Annual (Subsequent) preventive examination.  Review of Systems:  No ROS.  Medicare Wellness Visit. Additional risk factors are reflected in the social history.  Cardiac Risk Factors include: advanced age (>59mn, >>43women);diabetes mellitus;hypertension Sleep patterns: Getting 7-8 hours of sleep a night. Wakes up 3 times a night to go to the bathroom. When wakes up feels rested   Home Safety/Smoke Alarms: Feels safe in home. Smoke alarms in place.  Living environment;  Lives in 1 story home alone. No steps in the home. Shower is a step over tub and grab bar in place. Seat Belt Safety/Bike Helmet: Wears seat belt.   Female:   Pap-  utd     Mammo-   utd    Dexa scan- utd       CCS- utd     Objective:     Vitals: BP 139/76 (BP Location: Left Arm, Patient Position: Sitting, Cuff Size: Normal)   Pulse 86   Ht 5' 3" (1.6 m)   Wt 225 lb (102.1 kg)   SpO2 96%   BMI 39.86 kg/m   Body mass index is 39.86 kg/m.  Advanced Directives 07/11/2018 12/02/2017 11/25/2017 11/21/2017 10/17/2017 03/17/2016 06/13/2015  Does Patient Have a Medical Advance Directive? No No No No Yes No No  Type of Advance Directive - - - - [place]? - - - - No - copy requested - -  Would patient like information on creating a medical advance directive? Yes (MAU/Ambulatory/Procedural Areas - Information given) No - Patient declined No - Patient declined No - Patient declined - - No - patient declined information    Tobacco Social History   Tobacco Use  Smoking Status Former Smoker  . Types: Cigarettes  . Last attempt to quit: 07/30/2007  . Years since quitting: 10.9  Smokeless Tobacco Never Used     Counseling given: No   Clinical Intake:  Pre-visit preparation completed: Yes  Pain : 0-10 Pain Score: 5  Pain Type: Chronic pain Pain Location: Leg Pain  Orientation: Left Pain Radiating Towards: radiates down from hip to knee. Got injection today Pain Descriptors / Indicators: Dull, Throbbing Pain Onset: More than a month ago Pain Frequency: Constant Pain Relieving Factors: nothing Effect of Pain on Daily Activities: does cause pain when doing activities  Pain Relieving Factors: nothing  Nutritional Risks: None Diabetes: Yes CBG done?: No(at home fbs WAS 101) Did pt. bring in CBG monitor from home?: No  How often do you need to have someone help you when you read instructions, pamphlets, or other written materials from your doctor or pharmacy?: 1 - Never What is the last grade level you completed in school?: 9TH  Interpreter Needed?: No  Information entered by :: kOrlie Dakin lpn  Past Medical History:  Diagnosis Date  . Allergy   . Diabetes mellitus without complication ([place]   . GERD (gastroesophageal reflux disease)   . Headache    migraines last one 2 weeks ago  . History of hiatal hernia   . Hypercholesterolemia   . Palpitations    in the past, no current issues per patient  . Postlaminectomy syndrome    Past Surgical History:  Procedure Laterality Date  . ABDOMINAL HYSTERECTOMY  1982  . BACK SURGERY    . BREAST REDUCTION SURGERY Bilateral 03/17/2016   Procedure: MAMMARY REDUCTION  (  BREAST);  Surgeon: Wallace Going, DO;  Location: [facility];  Service: Plastics;  Laterality: Bilateral;  . SHOULDER SURGERY  06/2008, 09/2010   Right, by Dr. Karie Soda, then Dr. Judeth Horn  . SPINAL CORD STIMULATOR INSERTION N/A 12/02/2017   Procedure: LUMBAR SPINAL CORD STIMULATOR INSERTION;  Surgeon: Ashok Pall, MD;  Location: [facility];  Service: Neurosurgery;  Laterality: N/A;  . SPINAL CORD STIMULATOR TRIAL N/A 11/25/2017   Procedure: INSERTION LUMBAR SPINAL CORD STIMULATOR TRIAL  ;  Surgeon: Ashok Pall, MD;  Location: [facility];  Service: Neurosurgery;  Laterality: N/A;   INSERTION LUMBAR SPINAL CORD STIMULATOR TRIAL      Family History  Problem Relation Age of Onset  . Heart disease Mother   . Diabetes Mother   . Hypertension Mother   . Stroke Mother   . Heart disease Father   . Heart disease Sister   . Diabetes Sister   . Hyperlipidemia Sister   . Hypertension Sister   . Stroke Sister   . Alcohol abuse Brother   . Hyperlipidemia Brother    Social History   Socioeconomic History  . Marital status: Divorced    Spouse name: Not on file  . Number of children: 1  . Years of education: 9th grade  . Highest education level: 9th grade  Occupational History  . Occupation: Disabled.     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 Financial resource strain: Not very hard  . Food insecurity:    Worry: Often true    Inability: Often true  . Transportation needs:    Medical: No    Non-medical: No  Tobacco Use  . Smoking status: Former Smoker    Types: Cigarettes    Last attempt to quit: 07/30/2007    Years since quitting: 10.9  . Smokeless tobacco: Never Used  Substance and Sexual Activity  . Alcohol use: No  . Drug use: No  . Sexual activity: Never  Lifestyle  . Physical activity:    Days per week: 3 days    Minutes per session: 30 min  . Stress: Not at all  Relationships  . Social connections:    Talks on phone: More than three times a week    Gets together: More than three times a week    Attends religious service: More than 4 times per year    Active member of club or organization: No    Attends meetings of clubs or organizations: Never    Relationship status: Divorced  Other Topics Concern  . Not on file  Social History Narrative   Caffeine intake. Some regular exercise. Works part time at [employer]    Outpatient Encounter Medications as of 07/11/2018  Medication Sig  . ACCU-CHEK SOFTCLIX LANCETS lancets USE AS DIRECTED TO TEST BLOOD SUGAR 3 TIMES A DAY  . aspirin EC 81 MG tablet Take 1 tablet (81 mg total) by mouth daily.  . B-D ULTRAFINE III SHORT  PEN 31G X 8 MM MISC USE AS DIRECTED  . blood glucose meter kit and supplies KIT Dispense glucometer, test strips, and lancets. Use to check blood sugar twice daily. Dx: E11.9  . cholecalciferol (VITAMIN D) 1000 units tablet Take 1,000 Units by mouth daily.  Marland Kitchen DEXILANT 60 MG capsule Take 60 mg by mouth daily.   Marland Kitchen dicyclomine (BENTYL) 10 MG capsule Take 10 mg by mouth 4 (four) times daily.   Marland Kitchen doxepin (SINEQUAN) 10 MG capsule Take 10 mg by  mouth at bedtime.  . empagliflozin (JARDIANCE) 25 MG TABS tablet Take 25 mg by mouth daily.  . famotidine (PEPCID) 40 MG tablet Take 40 mg by mouth every evening.  . fluticasone (FLONASE) 50 MCG/ACT nasal spray Place 1 spray into both nostrils daily.  Marland Kitchen gabapentin (NEURONTIN) 300 MG capsule TAKE 1 CAPSULE (300 MG TOTAL) BY MOUTH 3 (THREE) TIMES DAILY. MAX OF 3600MG/DAY  . glipiZIDE (GLUCOTROL) 10 MG tablet Take 1 tablet (10 mg total) by mouth 2 (two) times daily.  Marland Kitchen glucose blood (ACCU-CHEK AVIVA PLUS) test strip For testing blood sugars 3 times daily. Dx:E11.9  . Insulin Glargine (LANTUS SOLOSTAR) 100 UNIT/ML Solostar Pen Inject 36 Units into the skin daily at 10 pm.  . levocetirizine (XYZAL) 5 MG tablet Take 1 tablet (5 mg total) by mouth every evening.  . Multiple Vitamins-Minerals (MULTIVITAMIN WOMEN 50+ PO) Take 1 capsule by mouth daily.  . naproxen sodium (ALEVE) 220 MG tablet Take 220 mg by mouth 3 (three) times daily.   Marland Kitchen perphenazine (TRILAFON) 8 MG tablet TAKE ONE TABLET (8 MG TOTAL) BY MOUTH AT BEDTIME.  . pravastatin (PRAVACHOL) 40 MG tablet TAKE 1 TABLET BY MOUTH AT BEDTIME  . traMADol (ULTRAM) 50 MG tablet Take 1 tablet (50 mg total) by mouth every 6 (six) hours as needed.  . [DISCONTINUED] gabapentin (NEURONTIN) 300 MG capsule TAKE 1 CAPSULE (300 MG TOTAL) BY MOUTH 3 (THREE) TIMES DAILY. MAX OF 3600MG/DAY   No facility-administered encounter medications on file as of 07/11/2018.     Activities of Daily Living In your present state of health, do  you have any difficulty performing the following activities: 07/11/2018 11/25/2017  Hearing? N N  Vision? Y N  Comment at night has difficulty. No diabetic retinopathy -  Difficulty concentrating or making decisions? N N  Walking or climbing stairs? N N  Dressing or bathing? N Y  Doing errands, shopping? N N  Preparing Food and eating ? N -  Using the Toilet? N -  In the past six months, have you accidently leaked urine? Y -  Comment has been going on for a while, when sneezes or coughs will have some urine leak. -  Do you have problems with loss of bowel control? N -  Managing your Medications? N -  Managing your Finances? N -  Housekeeping or managing your Housekeeping? N -  Some recent data might be hidden    Patient Care Team: Hali Marry, MD as PCP - General (Family Medicine) Eulis Canner (Gastroenterology)    Assessment:   This is a routine wellness examination for Amanda Morris.Physical assessment deferred to PCP.   Exercise Activities and Dietary recommendations Current Exercise Habits: Structured exercise class, Type of exercise: walking;Other - see comments(rides the bike), Time (Minutes): 30, Frequency (Times/Week): 3, Weekly Exercise (Minutes/Week): 90, Intensity: Mild Diet Patient states "eats junk" but plans on changing that. Will start eating vegetables. Breakfast: sandwich Lunch: skips Dinner:  TV dinner. Discussed with patient important to eat  At least 3 meals a day and can have a healthy snack in between.   Drinks a lot of water daily  Goals    . Weight (lb) < 200 lb (90.7 kg)     Wants to lose 30 - 50 lbs in the year 2020       Fall Risk Fall Risk  07/11/2018 07/19/2017 01/18/2017 09/21/2016  Falls in the past year? 0 No Yes Yes  Number falls in past yr: - - 1  1  Injury with Fall? - - No No  Risk for fall due to : - - Impaired balance/gait Other (Comment)  Risk for fall due to: Comment - - - climbing up stairs  Follow up - - Falls prevention  discussed Falls prevention discussed   Is the patient's home free of loose throw rugs in walkways, pet beds, electrical cords, etc?   yes      Grab bars in the bathroom? yes      Handrails on the stairs?   no      Adequate lighting?   yes   Depression Screen PHQ 2/9 Scores 07/11/2018 07/11/2018 07/19/2017 01/18/2017  PHQ - 2 Score 0 2 0 0  PHQ- 9 Score 1 3 - -     Cognitive Function     6CIT Screen 07/11/2018 01/18/2017  What Year? 0 points 0 points  What month? 0 points 0 points  What time? 0 points 0 points  Count back from 20 0 points 2 points  Months in reverse 0 points 0 points  Repeat phrase 10 points 6 points  Total Score 10 8    Immunization History  Administered Date(s) Administered  . Influenza Split 03/02/2011, 02/25/2012  . Influenza, High Dose Seasonal PF 06/06/2017, 12/25/2017  . Influenza,inj,Quad PF,6+ Mos 02/13/2015, 12/23/2015, 12/22/2016  . Influenza-Unspecified 01/24/2013, 01/21/2014, 02/26/2015  . PPD Test 02/16/2017  . Pneumococcal Conjugate-13 02/14/2015  . Pneumococcal Polysaccharide-23 05/12/2005, 01/17/2018  . Tdap 04/27/2007, 07/30/2017  . Zoster Recombinat (Shingrix) 04/01/2017, 08/10/2017    Screening Tests Health Maintenance  Topic Date Due  . URINE MICROALBUMIN  07/20/2018  . OPHTHALMOLOGY EXAM  08/24/2018 (Originally 05/14/2016)  . FOOT EXAM  07/20/2018  . HEMOGLOBIN A1C  10/10/2018  . MAMMOGRAM  04/28/2019  . COLONOSCOPY  10/29/2021  . TETANUS/TDAP  07/31/2027  . INFLUENZA VACCINE  Completed  . DEXA SCAN  Completed  . Hepatitis C Screening  Completed  . HIV Screening  Completed  . PNA vac Low Risk Adult  Completed       Plan:    Amanda Morris , Thank you for taking time to come for your Medicare Wellness Visit. I appreciate your ongoing commitment to your health goals. Please review the following plan we discussed and let me know if I can assist you in the future.   Please schedule your next medicare wellness visit with me in 1  yr. Continue doing brain stimulating activities (puzzles, reading, adult coloring books, staying active) to keep memory sharp.  Bring a copy of your living will and/or healthcare power of attorney to your next office visit.   These are the goals we discussed: Goals    . Weight (lb) < 200 lb (90.7 kg)     Wants to lose 30 - 50 lbs in the year 2020       This is a list of the screening recommended for you and due dates:  Health Maintenance  Topic Date Due  . Urine Protein Check  07/20/2018  . Eye exam for diabetics  08/24/2018*  . Complete foot exam   07/20/2018  . Hemoglobin A1C  10/10/2018  . Mammogram  04/28/2019  . Colon Cancer Screening  10/29/2021  . Tetanus Vaccine  07/31/2027  . Flu Shot  Completed  . DEXA scan (bone density measurement)  Completed  .  Hepatitis C: One time screening is recommended by Center for Disease Control  (CDC) for  adults born from [birth year] through 1963.   Completed  .  HIV Screening  Completed  . Pneumonia vaccines  Completed  *Topic was postponed. The date shown is not the original due date.     I have personally reviewed and noted the following in the patient's chart:   . Medical and social history . Use of alcohol, tobacco or illicit drugs  . Current medications and supplements . Functional ability and status . Nutritional status . Physical activity . Advanced directives . List of other physicians . Hospitalizations, surgeries, and ER visits in previous 12 months . Vitals . Screenings to include cognitive, depression, and falls . Referrals and appointments  In addition, I have reviewed and discussed with patient certain preventive protocols, quality metrics, and best practice recommendations. A written personalized care plan for preventive services as well as general preventive health recommendations were provided to patient.     Joanne Chars, LPN  [id number]/88/[id number]

## 2018-07-11 NOTE — Progress Notes (Signed)
Subjective:    CC: DM  HPI:  Diabetes - no hypoglycemic events. No wounds or sores that are not healing well. No increased thirst or urination. Checking glucose at home. Taking medications as prescribed without any side effects.  Is doing well on current regimen.  She did bring in her blood glucose log.  Overall they look good though she did have a couple low blood sugars in the 70s in the mornings in particular.  I did not see any blood sugar greater than 200.  Though she is try to be really careful with her diet and has been try to take her medication consistently.  Hypertension- Pt denies chest pain, SOB, dizziness, or heart palpitations.  Taking meds as directed w/o problems.  Denies medication side effects.    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 Lawrence Marseilles.  She is currently on gabapentin.  Past medical history, Surgical history, Family history not pertinant except as noted below, Social history, Allergies, and medications have been entered into the medical record, reviewed, and corrections made.   Review of Systems: No fevers, chills, night sweats, weight loss, chest pain, or shortness of breath.   Objective:    General: Well Developed, well nourished, and in no acute distress.  Neuro: Alert and oriented x3, extra-ocular muscles intact, sensation grossly intact.  HEENT: Normocephalic, atraumatic  Skin: Warm and dry, no rashes. Cardiac: Regular rate and rhythm, no murmurs rubs or gallops, no lower extremity edema.  Respiratory: Clear to auscultation bilaterally. Not using accessory muscles, speaking in full sentences.   Impression and Recommendations:    DM -well controlled.  Hemoglobin A1c is 6.8 today up just a little bit from previous of 6.7 but otherwise doing well.  Continue with pravastatin.  Can follow-up in 3 months.  HTN - Well controlled. Continue current regimen. Follow up in  6 months.    Referral neuropathy-doing well with gabapentin.   Refill sent to pharmacy today.

## 2018-07-11 NOTE — Progress Notes (Signed)
Amanda Morris is a 69 y.o. female who presents to [facility] today for left lateral leg pain and burning pain down the left thigh.  Lilyian has had pain in the left lateral hip.  This is been ongoing for months and years.  She denies any recent injury.  She notes pain is present in the lateral hip when she stands from a seated position and lays on her left side.  She denies any pain radiating below the level of the knee.  She denies significant weakness.  No bowel or bladder dysfunction.  Additionally she notes a burning pain down the left lateral thigh.  This is also been going on for months to years.  She denies any recent injury.  She did note several years ago she had a tearing sensation in her thigh on the left lateral side but cannot recall an injury that might of prompted that.  She was sitting down when she felt the sensation.  She has this is quite obnoxious and interferes with normal activity and sleep.  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  She had 1 session of physical therapy but was unable to continue due to cost.  She has not been doing home exercises and not treating it much.    ROS:  As above  Exam:  BP 124/71   Pulse 88   Temp 98.6 F (37 C) (Oral)   Wt 225 lb (102.1 kg)   BMI 39.86 kg/m  Wt Readings from Last 5 Encounters:  07/11/18 225 lb (102.1 kg)  07/11/18 225 lb (102.1 kg)  04/11/18 220 lb (99.8 kg)  04/10/18 223 lb (101.2 kg)  01/17/18 221 lb (100.2 kg)   General: Well Developed, well nourished, and in no acute distress.  Neuro/Psych: Alert and oriented x3, extra-ocular muscles intact, able to move all 4 extremities, sensation grossly intact. Skin: Warm and dry, no rashes noted.  Respiratory: Not using accessory muscles, speaking in full sentences, trachea midline.  Cardiovascular: Pulses palpable, no extremity edema. Abdomen: Does not  appear distended. MSK:  Left hip: Normal-appearing normal motion.  Tender to palpation trochanteric bursa. Hip abduction strength diminished 3+/5. Mild antalgic gait present.  Additionally patient has reproduction of her left lateral thigh paresthesia with palpation and pressure overlying ASIS anterior hip    Hip greater trochanteric injection: left Consent obtained and timeout performed. Area of maximum tenderness palpated and identified. Skin cleaned with alcohol, cold spray applied. A spinal needle was used to access the greater trochanteric bursa. 80 mg of Depo-Medrol, and 4 mL of Marcaine were used to inject the trochanteric bursa. Patient tolerated the procedure well.     Assessment and Plan: 68 y.o. female with  Left lateral hip pain is trochanteric bursitis very likely.  Proceeded with injection today and will proceed with home exercise program.  Exercises taught today during visit.  Recheck in 1 month.  Patient cannot afford physical therapy at this time.  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  Will consider gabapentin or Lyrica along with ultrasound-guided lateral femoral cutaneous nerve hydrodissection at next visit if needed.  Recheck in 1 month.  I spent 25 minutes with this patient, greater than 50% was face-to-face time counseling regarding differential diagnosis treatment plan options and exercise teaching.   Historical information moved to improve visibility of documentation.  Past Medical History:  Diagnosis Date  . Allergy   .  Diabetes mellitus without complication ([place])   . GERD (gastroesophageal reflux disease)   . Headache    migraines last one 2 weeks ago  . History of hiatal hernia   . Hypercholesterolemia   . Palpitations    in the past, no current issues per patient  . Postlaminectomy syndrome    Past Surgical History:  Procedure Laterality Date  . ABDOMINAL HYSTERECTOMY  1982  . BACK SURGERY    .  BREAST REDUCTION SURGERY Bilateral 03/17/2016   Procedure: MAMMARY REDUCTION  (BREAST);  Surgeon: Wallace Going, DO;  Location: [facility];  Service: Plastics;  Laterality: Bilateral;  . SHOULDER SURGERY  06/2008, 09/2010   Right, by Dr. Karie Soda, then Dr. Judeth Horn  . SPINAL CORD STIMULATOR INSERTION N/A 12/02/2017   Procedure: LUMBAR SPINAL CORD STIMULATOR INSERTION;  Surgeon: Ashok Pall, MD;  Location: [facility];  Service: Neurosurgery;  Laterality: N/A;  . SPINAL CORD STIMULATOR TRIAL N/A 11/25/2017   Procedure: INSERTION LUMBAR SPINAL CORD STIMULATOR TRIAL  ;  Surgeon: Ashok Pall, MD;  Location: [facility];  Service: Neurosurgery;  Laterality: N/A;   INSERTION LUMBAR SPINAL CORD STIMULATOR TRIAL     Social History   Tobacco Use  . Smoking status: Former Smoker    Types: Cigarettes    Last attempt to quit: 07/30/2007    Years since quitting: 10.9  . Smokeless tobacco: Never Used  Substance Use Topics  . Alcohol use: No   family history includes Alcohol abuse in her brother; Diabetes in her mother and sister; Heart disease in her father, mother, and sister; Hyperlipidemia in her brother and sister; Hypertension in her mother and sister; Stroke in her mother and sister.  Medications: Current Outpatient Medications  Medication Sig Dispense Refill  . ACCU-CHEK SOFTCLIX LANCETS lancets USE AS DIRECTED TO TEST BLOOD SUGAR 3 TIMES A DAY 100 each 10  . aspirin EC 81 MG tablet Take 1 tablet (81 mg total) by mouth daily.    . B-D ULTRAFINE III SHORT PEN 31G X 8 MM MISC USE AS DIRECTED 100 each 6  . blood glucose meter kit and supplies KIT Dispense glucometer, test strips, and lancets. Use to check blood sugar twice daily. Dx: E11.9 1 each 0  . cholecalciferol (VITAMIN D) 1000 units tablet Take 1,000 Units by mouth daily.    Marland Kitchen DEXILANT 60 MG capsule Take 60 mg by mouth daily.   3  . dicyclomine (BENTYL) 10 MG capsule Take 10 mg by mouth 4 (four) times daily.     Marland Kitchen doxepin  (SINEQUAN) 10 MG capsule Take 10 mg by mouth at bedtime.    . empagliflozin (JARDIANCE) 25 MG TABS tablet Take 25 mg by mouth daily. 90 tablet 1  . famotidine (PEPCID) 40 MG tablet Take 40 mg by mouth every evening.  5  . fluticasone (FLONASE) 50 MCG/ACT nasal spray Place 1 spray into both nostrils daily. 16 g 6  . gabapentin (NEURONTIN) 300 MG capsule TAKE 1 CAPSULE (300 MG TOTAL) BY MOUTH 3 (THREE) TIMES DAILY. MAX OF '3600MG'$ /DAY 180 capsule 3  . glipiZIDE (GLUCOTROL) 10 MG tablet Take 1 tablet (10 mg total) by mouth 2 (two) times daily. 180 tablet 1  . glucose blood (ACCU-CHEK AVIVA PLUS) test strip For testing blood sugars 3 times daily. Dx:E11.9 300 each 12  . Insulin Glargine (LANTUS SOLOSTAR) 100 UNIT/ML Solostar Pen Inject 36 Units into the skin daily at 10 pm. 5 pen PRN  . levocetirizine (XYZAL) 5 MG tablet Take  1 tablet (5 mg total) by mouth every evening. 90 tablet 3  . Multiple Vitamins-Minerals (MULTIVITAMIN WOMEN 50+ PO) Take 1 capsule by mouth daily.    . naproxen sodium (ALEVE) 220 MG tablet Take 220 mg by mouth 3 (three) times daily.     Marland Kitchen perphenazine (TRILAFON) 8 MG tablet TAKE ONE TABLET (8 MG TOTAL) BY MOUTH AT BEDTIME.  2  . pravastatin (PRAVACHOL) 40 MG tablet TAKE 1 TABLET BY MOUTH AT BEDTIME 90 tablet 3  . traMADol (ULTRAM) 50 MG tablet Take 1 tablet (50 mg total) by mouth every 6 (six) hours as needed. 60 tablet 0   No current facility-administered medications for this visit.    Allergies  Allergen Reactions  . Penicillins Hives and Other (See Comments)    PATIENT HAS HAD A PCN REACTION WITH IMMEDIATE RASH, FACIAL/TONGUE/THROAT SWELLING, SOB, OR LIGHTHEADEDNESS WITH HYPOTENSION:  #  #  YES  #  #  Has patient had a PCN reaction causing severe rash involving mucus membranes or skin necrosis: No Has patient had a PCN reaction that required hospitalization: No Has patient had a PCN reaction occurring within the last 10 years: No If all of the above answers are "NO", then  may proceed with Cephalosporin use.   . Aspirin Nausea Only  . Codeine Itching  . Hydrocodone Nausea Only  . Metformin And Related Diarrhea and Nausea Only  . Oxycodone Nausea And Vomiting    Other reaction(s): GI Upset (intolerance)  . Victoza [Liraglutide] Other (See Comments)    Abdominal pain      Discussed warning signs or symptoms. Please see discharge instructions. Patient expresses understanding.

## 2018-07-12 ENCOUNTER — Other Ambulatory Visit: Payer: Self-pay | Admitting: Family Medicine

## 2018-07-27 DIAGNOSIS — M961 Postlaminectomy syndrome, not elsewhere classified: Secondary | ICD-10-CM | POA: Diagnosis not present

## 2018-08-14 ENCOUNTER — Ambulatory Visit: Payer: Medicare Other | Admitting: Family Medicine

## 2018-08-14 ENCOUNTER — Encounter: Payer: Self-pay | Admitting: Family Medicine

## 2018-08-14 ENCOUNTER — Other Ambulatory Visit: Payer: Self-pay

## 2018-08-14 ENCOUNTER — Ambulatory Visit (INDEPENDENT_AMBULATORY_CARE_PROVIDER_SITE_OTHER): Payer: Medicare Other

## 2018-08-14 ENCOUNTER — Ambulatory Visit (INDEPENDENT_AMBULATORY_CARE_PROVIDER_SITE_OTHER): Payer: Medicare Other | Admitting: Family Medicine

## 2018-08-14 VITALS — BP 128/75 | HR 85 | Temp 98.2°F | Wt 227.0 lb

## 2018-08-14 DIAGNOSIS — G5712 Meralgia paresthetica, left lower limb: Secondary | ICD-10-CM

## 2018-08-14 DIAGNOSIS — M7062 Trochanteric bursitis, left hip: Secondary | ICD-10-CM | POA: Diagnosis not present

## 2018-08-14 DIAGNOSIS — M79605 Pain in left leg: Secondary | ICD-10-CM

## 2018-08-14 DIAGNOSIS — M1612 Unilateral primary osteoarthritis, left hip: Secondary | ICD-10-CM | POA: Diagnosis not present

## 2018-08-14 NOTE — Patient Instructions (Signed)
Thank you for coming in today. Call or go to the ER if you develop a large red swollen joint with extreme pain or oozing puss.   I think the problem is that nerve in your hip casuing leg pain.   The condition is Meralgia Paresthetica.    Keep me updated with how you are doing.   Call or go to the emergency room if you get worse, have trouble breathing, have chest pains, or palpitations.

## 2018-08-14 NOTE — Progress Notes (Signed)
Amanda Morris is a 68 y.o. female who presents to [place] today for follow up hip pain.   Nysia has been seen several times for left hip pain though to be due to trochanteric bursitis and thigh pain though to be due to Meralgia Paresthetica.   She was seen 1 month ago and received a greater trochanter injection that helped a bit with the lateral hip pain. Doing home exercises. Was going to the gym. Walking 3 days per weeks.   She notes she has considerable bothersome left lateral thigh pain and numbness.  She has this is her most bothersome symptom.  She thinks is directly connected to the left lateral hip pain as well.  She denies any fevers or chills or nausea vomiting or diarrhea.  She notes gabapentin has not been helpful despite taking it 3 times daily.     ROS:  As above  Exam:  BP 128/75   Pulse 85   Temp 98.2 F (36.8 C) (Oral)   Wt 227 lb (103 kg)   BMI 40.21 kg/m  Wt Readings from Last 5 Encounters:  08/14/18 227 lb (103 kg)  07/11/18 225 lb (102.1 kg)  07/11/18 225 lb (102.1 kg)  07/11/18 225 lb (102.1 kg)  04/11/18 220 lb (99.8 kg)   General: Well Developed, well nourished, and in no acute distress.  Neuro/Psych: Alert and oriented x3, extra-ocular muscles intact, able to move all 4 extremities, sensation grossly intact. Skin: Warm and dry, no rashes noted.  Respiratory: Not using accessory muscles, speaking in full sentences, trachea midline.  Cardiovascular: Pulses palpable, no extremity edema. Abdomen: Does not appear distended. MSK:  Left hip normal-appearing Normal motion. Leg pain reproduced with palpation overlying ASIS. Hip tender also to palpation greater trochanter. Hip abduction strength diminished 4/5.     Lab and Radiology Results No results found for this or any previous visit (from the past 72 hour(s)). Dg Femur Min 2 Views Left  Result Date: 08/14/2018 CLINICAL DATA:  Chronic  pain EXAM: LEFT FEMUR 2 VIEWS COMPARISON:  None. FINDINGS: Frontal and lateral views were obtained. There is no fracture or dislocation. No abnormal periosteal reaction. There is mild narrowing of the left hip and knee joints. No evident knee joint effusion. No erosive change. No blastic or lytic bone lesions. 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 No knee joint effusion. No fracture or dislocation. No blastic or lytic bone lesions. Electronically Signed   By: Lowella Grip III M.D.   On: 08/14/2018 10:16   I personally (independently) visualized and performed the interpretation of the images attached in this note.  Limited musculoskeletal ultrasound of left lateral femoral cutaneous nerve reveals enlarged nerve diameter and production of left lateral thigh paresthesias with ultrasound palpation.  Procedure: Real-time Ultrasound Guided Injection of left lateral femoral cutaneous nerve hydrodissection Device: GE Logiq E   Images permanently stored and available for review in the ultrasound unit. Verbal informed consent obtained.  Discussed risks and benefits of procedure. Warned about infection bleeding damage to structures skin hypopigmentation and fat atrophy among others. Patient expresses understanding and agreement Time-out conducted.   Noted no overlying erythema, induration, or other signs of local infection.   Skin prepped in a sterile fashion.   Local anesthesia: Topical Ethyl chloride.   With sterile technique and under real time ultrasound guidance:  2 mL of lidocaine and 40 mg of Kenalog injected easily.   Completed without difficulty  Pain immediately resolved suggesting accurate placement of the medication.   Advised to call if fevers/chills, erythema, induration, drainage, or persistent bleeding.   Images permanently stored and available for review in the ultrasound unit.  Impression: Technically successful ultrasound guided injection.       Assessment and Plan: 68 y.o. 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  Patient had immediate pain relief with ultrasound-guided Hydro dissection of lateral femoral cutaneous nerve.  I believe this to be the majority of her pain. I am hopeful that the steroid component of the injection may provide better pain relief.  Plan to recheck in the near future if not improving.  Additionally plan to work on home exercise program to improve left hip abduction strengthening for trochanteric bursitis.  Watchful waiting.   PDMP not reviewed this encounter. Orders Placed This Encounter  Procedures  . DG FEMUR MIN 2 VIEWS LEFT    Standing Status:   Future    Number of Occurrences:   1    Standing Expiration Date:   10/14/2019    Order Specific Question:   Reason for Exam (SYMPTOM  OR DIAGNOSIS REQUIRED)    Answer:   eval left thigh pain    Order Specific Question:   Preferred imaging location?    Answer:   [facility]    Order Specific Question:   Radiology Contrast Protocol - do NOT remove file path    Answer:   [email or web address]   No orders of the defined types were placed in this encounter.   Historical information moved to improve visibility of documentation.  Past Medical History:  Diagnosis Date  . Allergy   . Diabetes mellitus without complication ([place])   . GERD (gastroesophageal reflux disease)   . Headache    migraines last one 2 weeks ago  . History of hiatal hernia   . Hypercholesterolemia   . Palpitations    in the past, no current issues per patient  . Postlaminectomy syndrome    Past Surgical History:  Procedure Laterality Date  . ABDOMINAL HYSTERECTOMY  1982  . BACK SURGERY    . BREAST REDUCTION SURGERY Bilateral 03/17/2016   Procedure: MAMMARY REDUCTION  (BREAST);  Surgeon: Wallace Going, DO;  Location: [facility];  Service:  Plastics;  Laterality: Bilateral;  . SHOULDER SURGERY  06/2008, 09/2010   Right, by Dr. Karie Soda, then Dr. Judeth Horn  . SPINAL CORD STIMULATOR INSERTION N/A 12/02/2017   Procedure: LUMBAR SPINAL CORD STIMULATOR INSERTION;  Surgeon: Ashok Pall, MD;  Location: [facility];  Service: Neurosurgery;  Laterality: N/A;  . SPINAL CORD STIMULATOR TRIAL N/A 11/25/2017   Procedure: INSERTION LUMBAR SPINAL CORD STIMULATOR TRIAL  ;  Surgeon: Ashok Pall, MD;  Location: [facility];  Service: Neurosurgery;  Laterality: N/A;   INSERTION LUMBAR SPINAL CORD STIMULATOR TRIAL     Social History   Tobacco Use  . Smoking status: Former Smoker    Types: Cigarettes    Last attempt to quit: 07/30/2007    Years since quitting: 11.0  . Smokeless tobacco: Never Used  Substance Use Topics  . Alcohol use: No   family history includes Alcohol abuse in her brother; Diabetes in her mother and sister; Heart disease in her father, mother, and sister; Hyperlipidemia in her brother and sister; Hypertension in her mother and sister; Stroke in her mother and sister.  Medications: Current Outpatient Medications  Medication Sig Dispense Refill  . ACCU-CHEK SOFTCLIX LANCETS  lancets USE AS DIRECTED TO TEST BLOOD SUGAR 3 TIMES A DAY 100 each 10  . aspirin EC 81 MG tablet Take 1 tablet (81 mg total) by mouth daily.    . B-D ULTRAFINE III SHORT PEN 31G X 8 MM MISC USE AS DIRECTED 100 each 6  . blood glucose meter kit and supplies KIT Dispense glucometer, test strips, and lancets. Use to check blood sugar twice daily. Dx: E11.9 1 each 0  . cholecalciferol (VITAMIN D) 1000 units tablet Take 1,000 Units by mouth daily.    Marland Kitchen DEXILANT 60 MG capsule Take 60 mg by mouth daily.   3  . dicyclomine (BENTYL) 10 MG capsule Take 10 mg by mouth 4 (four) times daily.     Marland Kitchen doxepin (SINEQUAN) 10 MG capsule Take 10 mg by mouth at bedtime.    . empagliflozin (JARDIANCE) 25 MG TABS tablet Take 25 mg by mouth daily. 90 tablet 1  . famotidine (PEPCID) 40 MG  tablet Take 40 mg by mouth every evening.  5  . fluticasone (FLONASE) 50 MCG/ACT nasal spray PLACE 1 SPRAY INTO BOTH NOSTRILS DAILY. 50 g 2  . gabapentin (NEURONTIN) 300 MG capsule TAKE 1 CAPSULE (300 MG TOTAL) BY MOUTH 3 (THREE) TIMES DAILY. MAX OF 3600MG/DAY 540 capsule 2  . glipiZIDE (GLUCOTROL) 10 MG tablet Take 1 tablet (10 mg total) by mouth 2 (two) times daily. 180 tablet 1  . glucose blood (ACCU-CHEK AVIVA PLUS) test strip For testing blood sugars 3 times daily. Dx:E11.9 300 each 12  . Insulin Glargine (LANTUS SOLOSTAR) 100 UNIT/ML Solostar Pen Inject 36 Units into the skin daily at 10 pm. 5 pen PRN  . levocetirizine (XYZAL) 5 MG tablet Take 1 tablet (5 mg total) by mouth every evening. 90 tablet 3  . Multiple Vitamins-Minerals (MULTIVITAMIN WOMEN 50+ PO) Take 1 capsule by mouth daily.    . naproxen sodium (ALEVE) 220 MG tablet Take 220 mg by mouth 3 (three) times daily.     Marland Kitchen omeprazole (PRILOSEC) 40 MG capsule Take by mouth.    . perphenazine (TRILAFON) 8 MG tablet TAKE ONE TABLET (8 MG TOTAL) BY MOUTH AT BEDTIME.  2  . pravastatin (PRAVACHOL) 40 MG tablet TAKE 1 TABLET BY MOUTH AT BEDTIME 90 tablet 3  . traMADol (ULTRAM) 50 MG tablet Take 1 tablet (50 mg total) by mouth every 6 (six) hours as needed. 60 tablet 0   No current facility-administered medications for this visit.    Allergies  Allergen Reactions  . Penicillins Hives and Other (See Comments)    PATIENT HAS HAD A PCN REACTION WITH IMMEDIATE RASH, FACIAL/TONGUE/THROAT SWELLING, SOB, OR LIGHTHEADEDNESS WITH HYPOTENSION:  #  #  YES  #  #  Has patient had a PCN reaction causing severe rash involving mucus membranes or skin necrosis: No Has patient had a PCN reaction that required hospitalization: No Has patient had a PCN reaction occurring within the last 10 years: No If all of the above answers are "NO", then may proceed with Cephalosporin use.   . Aspirin Nausea Only  . Codeine Itching  . Hydrocodone Nausea Only  .  Metformin And Related Diarrhea and Nausea Only  . Oxycodone Nausea And Vomiting    Other reaction(s): GI Upset (intolerance)  . Victoza [Liraglutide] Other (See Comments)    Abdominal pain      Discussed warning signs or symptoms. Please see discharge instructions. Patient expresses understanding.

## 2018-08-21 ENCOUNTER — Other Ambulatory Visit: Payer: Self-pay | Admitting: Family Medicine

## 2018-09-29 ENCOUNTER — Other Ambulatory Visit: Payer: Self-pay | Admitting: Family Medicine

## 2018-10-05 ENCOUNTER — Other Ambulatory Visit: Payer: Self-pay | Admitting: Family Medicine

## 2018-10-05 ENCOUNTER — Encounter: Payer: Self-pay | Admitting: Family Medicine

## 2018-10-05 ENCOUNTER — Ambulatory Visit (INDEPENDENT_AMBULATORY_CARE_PROVIDER_SITE_OTHER): Payer: Medicare Other | Admitting: Family Medicine

## 2018-10-05 VITALS — Ht 63.0 in

## 2018-10-05 DIAGNOSIS — I10 Essential (primary) hypertension: Secondary | ICD-10-CM | POA: Diagnosis not present

## 2018-10-05 DIAGNOSIS — E1169 Type 2 diabetes mellitus with other specified complication: Secondary | ICD-10-CM

## 2018-10-05 NOTE — Progress Notes (Signed)
BS: 80 Eye exam UTD [facility] .Marland KitchenElouise Morris, [place]

## 2018-10-05 NOTE — Progress Notes (Signed)
Virtual Visit via Video Note  I connected with Amanda Morris on 10/05/18 at  1:00 PM EDT by a video enabled telemedicine application and verified that I am speaking with the correct person using two identifiers.   I discussed the limitations of evaluation and management by telemedicine and the availability of in person appointments. The patient expressed understanding and agreed to proceed.  Subjective:    CC: F/U DM, 3 mo  HPI: Diabetes -he has had a few hypoglycemic events.  She does not feel bad when it happens she will just check her sugar and it will be a little low.  No wounds or sores that are not healing well. No increased thirst or urination. Checking glucose at home. Taking medications as prescribed without any side effects. BS 78.  Thinks her machine isn't working well.  Says she can check it and then a couple minutes check it again and there can be a big difference in the glucose number.  Hypertension- Pt denies chest pain, SOB, dizziness, or heart palpitations.  Taking meds as directed w/o problems.  Denies medication side effects.    She is sitting int he car and I was in my office for the appt.    Past medical history, Surgical history, Family history not pertinant except as noted below, Social history, Allergies, and medications have been entered into the medical record, reviewed, and corrections made.   Review of Systems: No fevers, chills, night sweats, weight loss, chest pain, or shortness of breath.   Objective:    General: Speaking clearly in complete sentences without any shortness of breath.  Alert and oriented x3.  Normal judgment. No apparent acute distress.    Impression and Recommendations:   DM - well controlled. Due for A1C.  Will need to go to the lab after the 17th of the month.  Will call to get her most up-to-date eye exam.  Really it sounds like she is doing well she is had a few hypoglycemic events.  HTN - No BP cuff at home.      Time  spent in non-face-to-face encounter 16 minutes.  I discussed the assessment and treatment plan with the patient. The patient was provided an opportunity to ask questions and all were answered. The patient agreed with the plan and demonstrated an understanding of the instructions.   The patient was advised to call back or seek an in-person evaluation if the symptoms worsen or if the condition fails to improve as anticipated.   Beatrice Lecher, MD

## 2018-10-13 ENCOUNTER — Other Ambulatory Visit: Payer: Self-pay | Admitting: Family Medicine

## 2018-10-16 ENCOUNTER — Telehealth: Payer: Self-pay | Admitting: Family Medicine

## 2018-10-16 ENCOUNTER — Other Ambulatory Visit: Payer: Self-pay | Admitting: Family Medicine

## 2018-10-16 NOTE — Telephone Encounter (Signed)
Pt advised that she can go today.Amanda Morris, [place]

## 2018-10-16 NOTE — Telephone Encounter (Signed)
Pt left vm. She wants to know when should she get her bloodwork done?[employer]

## 2018-10-17 ENCOUNTER — Other Ambulatory Visit: Payer: Self-pay | Admitting: Family Medicine

## 2018-10-17 DIAGNOSIS — I10 Essential (primary) hypertension: Secondary | ICD-10-CM | POA: Diagnosis not present

## 2018-10-17 DIAGNOSIS — E1169 Type 2 diabetes mellitus with other specified complication: Secondary | ICD-10-CM | POA: Diagnosis not present

## 2018-10-17 DIAGNOSIS — E119 Type 2 diabetes mellitus without complications: Secondary | ICD-10-CM

## 2018-10-17 MED ORDER — FLUTICASONE PROPIONATE 50 MCG/ACT NA SUSP: 1.0000 | Freq: Every day | NASAL | 4 refills | Status: DC

## 2018-10-17 NOTE — Addendum Note (Signed)
Addended by: Narda Rutherford on: 10/17/2018 07:34 AM   Modules accepted: Orders

## 2018-10-18 LAB — BASIC METABOLIC PANEL WITH GFR
BUN: 14 mg/dL (ref 7–25)
CO2: 26 mmol/L (ref 20–32)
Calcium: 9.3 mg/dL (ref 8.6–10.4)
Chloride: 107 mmol/L (ref 98–110)
Creat: 0.8 mg/dL (ref 0.50–0.99)
GFR, Est African American: 89 mL/min/{1.73_m2} (ref >=60)
GFR, Est Non African American: 77 mL/min/{1.73_m2} (ref >=60)
Glucose, Bld: 127 mg/dL — ABNORMAL HIGH (ref 65–99)
Potassium: 4.6 mmol/L (ref 3.5–5.3)
Sodium: 141 mmol/L (ref 135–146)

## 2018-10-18 LAB — HEMOGLOBIN A1C
Hgb A1c MFr Bld: 7 % of total Hgb — ABNORMAL HIGH (ref <5.7)
Mean Plasma Glucose: 154 (calc)
eAG (mmol/L): 8.5 (calc)

## 2018-11-01 LAB — HM DIABETES EYE EXAM

## 2018-11-02 LAB — HM DIABETES EYE EXAM

## 2018-11-09 ENCOUNTER — Other Ambulatory Visit: Payer: Self-pay | Admitting: Family Medicine

## 2018-11-09 DIAGNOSIS — E119 Type 2 diabetes mellitus without complications: Secondary | ICD-10-CM

## 2018-11-14 ENCOUNTER — Other Ambulatory Visit: Payer: Self-pay | Admitting: Family Medicine

## 2018-11-14 DIAGNOSIS — E119 Type 2 diabetes mellitus without complications: Secondary | ICD-10-CM

## 2018-11-25 ENCOUNTER — Other Ambulatory Visit: Payer: Self-pay | Admitting: Family Medicine

## 2018-11-25 DIAGNOSIS — I10 Essential (primary) hypertension: Secondary | ICD-10-CM

## 2018-11-25 DIAGNOSIS — Z79899 Other long term (current) drug therapy: Secondary | ICD-10-CM | POA: Diagnosis not present

## 2018-11-25 DIAGNOSIS — Z888 Allergy status to other drugs, medicaments and biological substances status: Secondary | ICD-10-CM | POA: Diagnosis not present

## 2018-11-25 DIAGNOSIS — Z794 Long term (current) use of insulin: Secondary | ICD-10-CM | POA: Diagnosis not present

## 2018-11-25 DIAGNOSIS — S39012A Strain of muscle, fascia and tendon of lower back, initial encounter: Secondary | ICD-10-CM | POA: Diagnosis not present

## 2018-11-25 DIAGNOSIS — E119 Type 2 diabetes mellitus without complications: Secondary | ICD-10-CM

## 2018-11-25 DIAGNOSIS — Z7982 Long term (current) use of aspirin: Secondary | ICD-10-CM | POA: Diagnosis not present

## 2018-11-25 DIAGNOSIS — Z880 Allergy status to penicillin: Secondary | ICD-10-CM | POA: Diagnosis not present

## 2018-11-25 DIAGNOSIS — E785 Hyperlipidemia, unspecified: Secondary | ICD-10-CM | POA: Diagnosis not present

## 2018-11-25 DIAGNOSIS — Z9682 Presence of neurostimulator: Secondary | ICD-10-CM | POA: Diagnosis not present

## 2018-11-25 DIAGNOSIS — M549 Dorsalgia, unspecified: Secondary | ICD-10-CM | POA: Diagnosis not present

## 2018-11-25 DIAGNOSIS — K219 Gastro-esophageal reflux disease without esophagitis: Secondary | ICD-10-CM | POA: Diagnosis not present

## 2018-11-25 DIAGNOSIS — M47816 Spondylosis without myelopathy or radiculopathy, lumbar region: Secondary | ICD-10-CM | POA: Diagnosis not present

## 2018-11-28 ENCOUNTER — Encounter: Payer: Self-pay | Admitting: Physician Assistant

## 2018-11-28 ENCOUNTER — Other Ambulatory Visit: Payer: Self-pay

## 2018-11-28 ENCOUNTER — Ambulatory Visit (INDEPENDENT_AMBULATORY_CARE_PROVIDER_SITE_OTHER): Payer: Medicare Other | Admitting: Physician Assistant

## 2018-11-28 VITALS — BP 118/76 | HR 88 | Ht 64.0 in | Wt 236.0 lb

## 2018-11-28 DIAGNOSIS — I951 Orthostatic hypotension: Secondary | ICD-10-CM

## 2018-11-28 DIAGNOSIS — R42 Dizziness and giddiness: Secondary | ICD-10-CM

## 2018-11-28 DIAGNOSIS — M545 Low back pain, unspecified: Secondary | ICD-10-CM

## 2018-11-28 DIAGNOSIS — M4316 Spondylolisthesis, lumbar region: Secondary | ICD-10-CM | POA: Diagnosis not present

## 2018-11-28 DIAGNOSIS — G8929 Other chronic pain: Secondary | ICD-10-CM

## 2018-11-28 MED ORDER — KETOROLAC TROMETHAMINE 30 MG/ML IJ SOLN
30.0000 mg | Freq: Once | INTRAMUSCULAR | Status: AC
  Administered 2018-11-28: 30 mg via INTRAMUSCULAR

## 2018-11-28 MED ORDER — MELOXICAM 15 MG PO TABS: 15.0000 mg | ORAL_TABLET | Freq: Every day | ORAL | 0 refills | Status: DC

## 2018-11-28 MED ORDER — TIZANIDINE HCL 4 MG PO TABS: 4.0000 mg | ORAL_TABLET | Freq: Four times a day (QID) | ORAL | 0 refills | Status: DC | PRN

## 2018-11-28 NOTE — Patient Instructions (Addendum)
Replace aleve with mobic starting tomorrow morning.  zanaflex as muscle relaxer.  Use heat and ice. Rest and stay hydrated.  Follow up as needed with PCP.    Orthostatic Hypotension Blood pressure is a measurement of how strongly, or weakly, your blood is pressing against the walls of your arteries. Orthostatic hypotension is a sudden drop in blood pressure that happens when you quickly change positions, such as when you get up from sitting or lying down. Arteries are blood vessels that carry blood from your heart throughout your body. When blood pressure is too low, you may not get enough blood to your brain or to the rest of your organs. This can cause weakness, light-headedness, rapid heartbeat, and fainting. This can last for just a few seconds or for up to a few minutes. Orthostatic hypotension is usually not a serious problem. However, if it happens frequently or gets worse, it may be a sign of something more serious. What are the causes? This condition may be caused by:  Sudden changes in posture, such as standing up quickly after you have been sitting or lying down.  Blood loss.  Loss of body fluids (dehydration).  Heart problems.  Hormone (endocrine) problems.  Pregnancy.  Severe infection.  Lack of certain nutrients.  Severe allergic reactions (anaphylaxis).  Certain medicines, such as blood pressure medicine or medicines that make the body lose excess fluids (diuretics). Sometimes, this condition can be caused by not taking medicine as directed, such as taking too much of a certain medicine. What increases the risk? The following factors may make you more likely to develop this condition:  Age. Risk increases as you get older.  Conditions that affect the heart or the central nervous system.  Taking certain medicines, such as blood pressure medicine or diuretics.  Being pregnant. What are the signs or symptoms? Symptoms of this condition may include:  Weakness.   Light-headedness.  Dizziness.  Blurred vision.  Fatigue.  Rapid heartbeat.  Fainting, in severe cases. How is this diagnosed? This condition is diagnosed based on:  Your medical history.  Your symptoms.  Your blood pressure measurement. Your health care provider will check your blood pressure when you are: ? Lying down. ? Sitting. ? Standing. A blood pressure reading is recorded as two numbers, such as "120 over 80" (or 120/80). The first ("top") number is called the systolic pressure. It is a measure of the pressure in your arteries as your heart beats. The second ("bottom") number is called the diastolic pressure. It is a measure of the pressure in your arteries when your heart relaxes between beats. Blood pressure is measured in a unit called mm Hg. Healthy blood pressure for most adults is 120/80. If your blood pressure is below 90/60, you may be diagnosed with hypotension. Other information or tests that may be used to diagnose orthostatic hypotension include:  Your other vital signs, such as your heart rate and temperature.  Blood tests.  Tilt table test. For this test, you will be safely secured to a table that moves you from a lying position to an upright position. Your heart rhythm and blood pressure will be monitored during the test. How is this treated? This condition may be treated by:  Changing your diet. This may involve eating more salt (sodium) or drinking more water.  Taking medicines to raise your blood pressure.  Changing the dosage of certain medicines you are taking that might be lowering your blood pressure.  Wearing compression stockings. These  stockings help to prevent blood clots and reduce swelling in your legs. In some cases, you may need to go to the hospital for:  Fluid replacement. This means you will receive fluids through an IV.  Blood replacement. This means you will receive donated blood through an IV (transfusion).  Treating an  infection or heart problems, if this applies.  Monitoring. You may need to be monitored while medicines that you are taking wear off. Follow these instructions at home: Eating and drinking   Drink enough fluid to keep your urine pale yellow.  Eat a healthy diet, and follow instructions from your health care provider about eating or drinking restrictions. A healthy diet includes: ? Fresh fruits and vegetables. ? Whole grains. ? Lean meats. ? Low-fat dairy products.  Eat extra salt only as directed. Do not add extra salt to your diet unless your health care provider told you to do that.  Eat frequent, small meals.  Avoid standing up suddenly after eating. Medicines  Take over-the-counter and prescription medicines only as told by your health care provider. ? Follow instructions from your health care provider about changing the dosage of your current medicines, if this applies. ? Do not stop or adjust any of your medicines on your own. General instructions   Wear compression stockings as told by your health care provider.  Get up slowly from lying down or sitting positions. This gives your blood pressure a chance to adjust.  Avoid hot showers and excessive heat as directed by your health care provider.  Return to your normal activities as told by your health care provider. Ask your health care provider what activities are safe for you.  Do not use any products that contain nicotine or tobacco, such as cigarettes, e-cigarettes, and chewing tobacco. If you need help quitting, ask your health care provider.  Keep all follow-up visits as told by your health care provider. This is important. Contact a health care provider if you:  Vomit.  Have diarrhea.  Have a fever for more than 2-3 days.  Feel more thirsty than usual.  Feel weak and tired. Get help right away if you:  Have chest pain.  Have a fast or irregular heartbeat.  Develop numbness in any part of your body.   Cannot move your arms or your legs.  Have trouble speaking.  Become sweaty or feel light-headed.  Faint.  Feel short of breath.  Have trouble staying awake.  Feel confused. Summary  Orthostatic hypotension is a sudden drop in blood pressure that happens when you quickly change positions.  Orthostatic hypotension is usually not a serious problem.  It is diagnosed by having your blood pressure taken lying down, sitting, and then standing.  It may be treated by changing your diet or adjusting your medicines. This information is not intended to replace advice given to you by your health care provider. Make sure you discuss any questions you have with your health care provider. Document Released: 04/02/2002 Document Revised: 10/06/2017 Document Reviewed: 10/06/2017 Elsevier Patient Education  2020 [employer].
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
## 2018-12-01 DIAGNOSIS — R42 Dizziness and giddiness: Secondary | ICD-10-CM | POA: Insufficient documentation

## 2018-12-01 DIAGNOSIS — I951 Orthostatic hypotension: Secondary | ICD-10-CM | POA: Insufficient documentation

## 2018-12-01 DIAGNOSIS — M545 Low back pain, unspecified: Secondary | ICD-10-CM | POA: Insufficient documentation

## 2018-12-08 ENCOUNTER — Ambulatory Visit (INDEPENDENT_AMBULATORY_CARE_PROVIDER_SITE_OTHER): Payer: Medicare Other | Admitting: Family Medicine

## 2018-12-08 ENCOUNTER — Other Ambulatory Visit: Payer: Self-pay

## 2018-12-08 ENCOUNTER — Encounter: Payer: Self-pay | Admitting: Family Medicine

## 2018-12-08 VITALS — BP 116/78 | HR 86 | Temp 99.1°F | Ht 64.0 in | Wt 239.0 lb

## 2018-12-08 DIAGNOSIS — M4316 Spondylolisthesis, lumbar region: Secondary | ICD-10-CM

## 2018-12-08 DIAGNOSIS — M5416 Radiculopathy, lumbar region: Secondary | ICD-10-CM

## 2018-12-08 DIAGNOSIS — R51 Headache: Secondary | ICD-10-CM | POA: Diagnosis not present

## 2018-12-08 DIAGNOSIS — M961 Postlaminectomy syndrome, not elsewhere classified: Secondary | ICD-10-CM

## 2018-12-08 DIAGNOSIS — R519 Headache, unspecified: Secondary | ICD-10-CM

## 2018-12-08 MED ORDER — TRAMADOL HCL 50 MG PO TABS: 50.0000 mg | ORAL_TABLET | Freq: Three times a day (TID) | ORAL | 0 refills | Status: AC | PRN

## 2018-12-08 NOTE — Patient Instructions (Signed)
Decrease gabapentin down to twice a day for 2 days and then down to once a day at bedtime.  When she get down to the daily tab then you can start the tramadol for pain relief.
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
## 2018-12-19 DIAGNOSIS — I10 Essential (primary) hypertension: Secondary | ICD-10-CM | POA: Diagnosis not present

## 2018-12-19 DIAGNOSIS — M47816 Spondylosis without myelopathy or radiculopathy, lumbar region: Secondary | ICD-10-CM | POA: Diagnosis not present

## 2018-12-21 DIAGNOSIS — R110 Nausea: Secondary | ICD-10-CM | POA: Diagnosis not present

## 2018-12-21 DIAGNOSIS — Z86010 Personal history of colonic polyps: Secondary | ICD-10-CM | POA: Diagnosis not present

## 2018-12-21 DIAGNOSIS — R1084 Generalized abdominal pain: Secondary | ICD-10-CM | POA: Diagnosis not present

## 2018-12-21 DIAGNOSIS — K219 Gastro-esophageal reflux disease without esophagitis: Secondary | ICD-10-CM | POA: Diagnosis not present

## 2018-12-21 DIAGNOSIS — R109 Unspecified abdominal pain: Secondary | ICD-10-CM | POA: Diagnosis not present

## 2018-12-21 DIAGNOSIS — R1011 Right upper quadrant pain: Secondary | ICD-10-CM | POA: Diagnosis not present

## 2018-12-21 DIAGNOSIS — R1013 Epigastric pain: Secondary | ICD-10-CM | POA: Diagnosis not present

## 2018-12-21 DIAGNOSIS — K589 Irritable bowel syndrome without diarrhea: Secondary | ICD-10-CM | POA: Diagnosis not present

## 2018-12-27 ENCOUNTER — Other Ambulatory Visit: Payer: Self-pay | Admitting: Neurology

## 2018-12-27 DIAGNOSIS — M545 Low back pain, unspecified: Secondary | ICD-10-CM

## 2018-12-27 NOTE — Addendum Note (Signed)
Addended byAnnamaria Helling on: 12/27/2018 03:01 PM   Modules accepted: Orders

## 2018-12-29 DIAGNOSIS — K219 Gastro-esophageal reflux disease without esophagitis: Secondary | ICD-10-CM | POA: Diagnosis not present

## 2018-12-29 DIAGNOSIS — R160 Hepatomegaly, not elsewhere classified: Secondary | ICD-10-CM | POA: Diagnosis not present

## 2018-12-29 DIAGNOSIS — K760 Fatty (change of) liver, not elsewhere classified: Secondary | ICD-10-CM | POA: Diagnosis not present

## 2019-01-03 DIAGNOSIS — Z1231 Encounter for screening mammogram for malignant neoplasm of breast: Secondary | ICD-10-CM | POA: Diagnosis not present

## 2019-01-04 LAB — HM MAMMOGRAPHY

## 2019-01-06 ENCOUNTER — Other Ambulatory Visit: Payer: Self-pay | Admitting: Family Medicine

## 2019-01-17 DIAGNOSIS — K3189 Other diseases of stomach and duodenum: Secondary | ICD-10-CM | POA: Diagnosis not present

## 2019-01-17 DIAGNOSIS — K219 Gastro-esophageal reflux disease without esophagitis: Secondary | ICD-10-CM | POA: Diagnosis not present

## 2019-02-05 ENCOUNTER — Encounter: Payer: Self-pay | Admitting: Family Medicine

## 2019-02-05 ENCOUNTER — Ambulatory Visit (INDEPENDENT_AMBULATORY_CARE_PROVIDER_SITE_OTHER): Payer: Medicare Other | Admitting: Family Medicine

## 2019-02-05 VITALS — BP 128/77 | HR 85 | Ht 64.0 in | Wt 235.0 lb

## 2019-02-05 DIAGNOSIS — E119 Type 2 diabetes mellitus without complications: Secondary | ICD-10-CM

## 2019-02-05 DIAGNOSIS — I10 Essential (primary) hypertension: Secondary | ICD-10-CM | POA: Diagnosis not present

## 2019-02-05 DIAGNOSIS — K2100 Gastro-esophageal reflux disease with esophagitis, without bleeding: Secondary | ICD-10-CM | POA: Diagnosis not present

## 2019-02-05 DIAGNOSIS — E1169 Type 2 diabetes mellitus with other specified complication: Secondary | ICD-10-CM | POA: Diagnosis not present

## 2019-02-05 LAB — POCT GLYCOSYLATED HEMOGLOBIN (HGB A1C): Hemoglobin A1C: 6.7 % — AB (ref 4.0–5.6)

## 2019-02-05 MED ORDER — GLIPIZIDE 10 MG PO TABS: 10.0000 mg | ORAL_TABLET | Freq: Two times a day (BID) | ORAL | 1 refills | Status: DC

## 2019-02-05 NOTE — Assessment & Plan Note (Signed)
Well-controlled.  Continue current regimen. 

## 2019-02-05 NOTE — Assessment & Plan Note (Signed)
Well controlled. Continue current regimen. Follow up in  3-4 mo  

## 2019-02-05 NOTE — Assessment & Plan Note (Addendum)
Well controlled. Continue current regimen. Follow up in  3-4 mo.  Due for labs.

## 2019-02-05 NOTE — Progress Notes (Signed)
Established Patient Office Visit  Subjective:  Patient ID: Amanda Morris, female    DOB: [birth year]  Age: 68 y.o. MRN: [id number]  CC:  Chief Complaint  Patient presents with  . Diabetes    HPI Kiona Blume presents for   Diabetes - no hypoglycemic events. No wounds or sores that are not healing well. No increased thirst or urination. Checking glucose at home. Taking medications as prescribed without any side effects. 90-120 in the AM.  She is using 38 units of Lantus daily.    Hypertension- Pt denies chest pain, SOB, dizziness, or heart palpitations.  Taking meds as directed w/o problems.  Denies medication side effects.   GERD f/u  - Her famotadine has been increased to TID.    Past Medical History:  Diagnosis Date  . Allergy   . Diabetes mellitus without complication ([place])   . GERD (gastroesophageal reflux disease)   . Headache    migraines last one 2 weeks ago  . History of hiatal hernia   . Hypercholesterolemia   . Palpitations    in the past, no current issues per patient  . Postlaminectomy syndrome     Past Surgical History:  Procedure Laterality Date  . ABDOMINAL HYSTERECTOMY  1982  . BACK SURGERY    . BREAST REDUCTION SURGERY Bilateral 03/17/2016   Procedure: MAMMARY REDUCTION  (BREAST);  Surgeon: Wallace Going, DO;  Location: [facility];  Service: Plastics;  Laterality: Bilateral;  . SHOULDER SURGERY  06/2008, 09/2010   Right, by Dr. Karie Soda, then Dr. Judeth Horn  . SPINAL CORD STIMULATOR INSERTION N/A 12/02/2017   Procedure: LUMBAR SPINAL CORD STIMULATOR INSERTION;  Surgeon: Ashok Pall, MD;  Location: [facility];  Service: Neurosurgery;  Laterality: N/A;  . SPINAL CORD STIMULATOR TRIAL N/A 11/25/2017   Procedure: INSERTION LUMBAR SPINAL CORD STIMULATOR TRIAL  ;  Surgeon: Ashok Pall, MD;  Location: [facility];  Service: Neurosurgery;  Laterality: N/A;   INSERTION LUMBAR SPINAL CORD STIMULATOR TRIAL      Family History   Problem Relation Age of Onset  . Heart disease Mother   . Diabetes Mother   . Hypertension Mother   . Stroke Mother   . Heart disease Father   . Heart disease Sister   . Diabetes Sister   . Hyperlipidemia Sister   . Hypertension Sister   . Stroke Sister   . Alcohol abuse Brother   . Hyperlipidemia Brother     Social History   Socioeconomic History  . Marital status: Divorced    Spouse name: Not on file  . Number of children: 1  . Years of education: 9th grade  . Highest education level: 9th grade  Occupational History  . Occupation: Disabled.     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 Financial resource strain: Not very hard  . Food insecurity    Worry: Often true    Inability: Often true  . Transportation needs    Medical: No    Non-medical: No  Tobacco Use  . Smoking status: Former Smoker    Types: Cigarettes    Quit date: 07/30/2007    Years since quitting: 11.5  . Smokeless tobacco: Never Used  Substance and Sexual Activity  . Alcohol use: No  . Drug use: No  . Sexual activity: Never  Lifestyle  . Physical activity    Days per week: 3 days    Minutes per session: 30 min  . Stress: Not at  all  Relationships  . Social connections    Talks on phone: More than three times a week    Gets together: More than three times a week    Attends religious service: More than 4 times per year    Active member of club or organization: No    Attends meetings of clubs or organizations: Never    Relationship status: Divorced  . Intimate partner violence    Fear of current or ex partner: No    Emotionally abused: No    Physically abused: No    Forced sexual activity: No  Other Topics Concern  . Not on file  Social History Narrative   Caffeine intake. Some regular exercise. Works part time at [employer]    Outpatient Medications Prior to Visit  Medication Sig Dispense Refill  . ACCU-CHEK AVIVA PLUS test strip FOR TESTING BLOOD SUGARS 3  TIMES DAILY. DX:E11.9 300 strip 12  . Accu-Chek Softclix Lancets lancets USE AS DIRECTED TO TEST BLOOD SUGAR 3 TIMES A DAY 100 each prn  . aspirin EC 81 MG tablet Take 1 tablet (81 mg total) by mouth daily.    . B-D ULTRAFINE III SHORT PEN 31G X 8 MM MISC USE AS DIRECTED 100 each 6  . blood glucose meter kit and supplies KIT Dispense glucometer, test strips, and lancets. Use to check blood sugar twice daily. Dx: E11.9 1 each 0  . cholecalciferol (VITAMIN D) 1000 units tablet Take 1,000 Units by mouth daily.    Marland Kitchen DEXILANT 60 MG capsule Take 60 mg by mouth daily.   3  . dicyclomine (BENTYL) 10 MG capsule Take 10 mg by mouth 4 (four) times daily.     Marland Kitchen doxepin (SINEQUAN) 10 MG capsule Take 10 mg by mouth at bedtime.    . famotidine (PEPCID) 40 MG tablet Take 40 mg by mouth 3 (three) times daily.   5  . fluticasone (FLONASE) 50 MCG/ACT nasal spray Place 1 spray into both nostrils daily. 48 mL 4  . gabapentin (NEURONTIN) 300 MG capsule TAKE 1 CAPSULE (300 MG TOTAL) BY MOUTH 3 (THREE) TIMES DAILY. MAX OF 3600MG/DAY 540 capsule 2  . JARDIANCE 25 MG TABS tablet TAKE 25 MG BY MOUTH DAILY. 90 tablet 1  . LANTUS SOLOSTAR 100 UNIT/ML Solostar Pen INJECT 36 UNITS INTO THE SKIN DAILY AT 10 PM. 15 mL 33  . levocetirizine (XYZAL) 5 MG tablet TAKE 1 TABLET BY MOUTH EVERY DAY IN THE EVENING 90 tablet 3  . meloxicam (MOBIC) 15 MG tablet Take 1 tablet (15 mg total) by mouth daily. 30 tablet 0  . Multiple Vitamins-Minerals (MULTIVITAMIN WOMEN 50+ PO) Take 1 capsule by mouth daily.    Marland Kitchen omeprazole (PRILOSEC) 40 MG capsule Take by mouth.    . perphenazine (TRILAFON) 8 MG tablet TAKE ONE TABLET (8 MG TOTAL) BY MOUTH AT BEDTIME.  2  . pravastatin (PRAVACHOL) 40 MG tablet TAKE 1 TABLET BY MOUTH AT BEDTIME 90 tablet 3  . tiZANidine (ZANAFLEX) 4 MG tablet Take 1 tablet (4 mg total) by mouth every 6 (six) hours as needed for muscle spasms. 30 tablet 0  . glipiZIDE (GLUCOTROL) 10 MG tablet TAKE 1 TABLET BY MOUTH TWICE A DAY  180 tablet 0   No facility-administered medications prior to visit.     Allergies  Allergen Reactions  . Penicillins Hives and Other (See Comments)    PATIENT HAS HAD A PCN REACTION WITH IMMEDIATE RASH, FACIAL/TONGUE/THROAT SWELLING, SOB, OR LIGHTHEADEDNESS WITH  HYPOTENSION:  #  #  YES  #  #  Has patient had a PCN reaction causing severe rash involving mucus membranes or skin necrosis: No Has patient had a PCN reaction that required hospitalization: No Has patient had a PCN reaction occurring within the last 10 years: No If all of the above answers are "NO", then may proceed with Cephalosporin use.   . Aspirin Nausea Only  . Codeine Itching  . Hydrocodone Nausea Only  . Metformin And Related Diarrhea and Nausea Only  . Oxycodone Nausea And Vomiting    Other reaction(s): GI Upset (intolerance)  . Victoza [Liraglutide] Other (See Comments)    Abdominal pain    ROS Review of Systems    Objective:    Physical Exam  Constitutional: She is oriented to person, place, and time. She appears well-developed and well-nourished.  HENT:  Head: Normocephalic and atraumatic.  Cardiovascular: Normal rate, regular rhythm and normal heart sounds.  Pulmonary/Chest: Effort normal and breath sounds normal.  Neurological: She is alert and oriented to person, place, and time.  Skin: Skin is warm and dry.  Psychiatric: She has a normal mood and affect. Her behavior is normal.    BP 128/77   Pulse 85   Ht _0  (1.626 m)   Wt 235 lb (106.6 kg)   SpO2 97%   BMI 40.34 kg/m  Wt Readings from Last 3 Encounters:  02/05/19 235 lb (106.6 kg)  12/08/18 239 lb (108.4 kg)  11/28/18 236 lb (107 kg)     There are no preventive care reminders to display for this patient.  There are no preventive care reminders to display for this patient.  Lab Results  Component Value Date   TSH 1.45 01/22/2015   Lab Results  Component Value Date   WBC 9.7 11/21/2017   HGB 12.8 11/21/2017   HCT 42.9  11/21/2017   MCV 80.9 11/21/2017   PLT 273 11/21/2017   Lab Results  Component Value Date   NA 141 10/17/2018   K 4.6 10/17/2018   CO2 26 10/17/2018   GLUCOSE 127 (H) 10/17/2018   BUN 14 10/17/2018   CREATININE 0.80 10/17/2018   BILITOT 0.3 01/17/2018   ALKPHOS 102 09/21/2016   AST 20 01/17/2018   ALT 12 01/17/2018   PROT 7.0 01/17/2018   ALBUMIN 3.8 09/21/2016   CALCIUM 9.3 10/17/2018   ANIONGAP 12 11/21/2017   Lab Results  Component Value Date   CHOL 157 01/17/2018   Lab Results  Component Value Date   HDL 43 (L) 01/17/2018   Lab Results  Component Value Date   LDLCALC 93 01/17/2018   Lab Results  Component Value Date   TRIG 112 01/17/2018   Lab Results  Component Value Date   CHOLHDL 3.7 01/17/2018   Lab Results  Component Value Date   HGBA1C 6.7 (A) 02/05/2019      Assessment & Plan:   Problem List Items Addressed This Visit      Cardiovascular and Mediastinum   Essential hypertension    Well controlled. Continue current regimen. Follow up in  3-4 mo      Relevant Medications   glipiZIDE (GLUCOTROL) 10 MG tablet   Other Relevant Orders   POCT HgB A1C (Completed)   COMPLETE METABOLIC PANEL WITH GFR   Lipid panel     Digestive   GERD (gastroesophageal reflux disease) - Primary    Well controlled. Continue current regimen.         Endocrine  Type 2 diabetes mellitus with other specified complication (HCC)    Well controlled. Continue current regimen. Follow up in  3-4 mo.  Due for labs.       Relevant Medications   glipiZIDE (GLUCOTROL) 10 MG tablet    Other Visit Diagnoses    Diabetes mellitus without complication (HCC)       Relevant Medications   glipiZIDE (GLUCOTROL) 10 MG tablet   Other Relevant Orders   POCT HgB A1C (Completed)   COMPLETE METABOLIC PANEL WITH GFR   Lipid panel      Meds ordered this encounter  Medications  . glipiZIDE (GLUCOTROL) 10 MG tablet    Sig: Take 1 tablet (10 mg total) by mouth 2 (two) times  daily.    Dispense:  180 tablet    Refill:  1    Follow-up: Return in about 3 months (around 05/08/2019) for Diabetes follow-up.    Beatrice Lecher, MD

## 2019-02-06 ENCOUNTER — Ambulatory Visit (INDEPENDENT_AMBULATORY_CARE_PROVIDER_SITE_OTHER): Payer: Medicare Other | Admitting: Family Medicine

## 2019-02-06 ENCOUNTER — Other Ambulatory Visit: Payer: Self-pay

## 2019-02-06 ENCOUNTER — Encounter: Payer: Self-pay | Admitting: Family Medicine

## 2019-02-06 ENCOUNTER — Ambulatory Visit (INDEPENDENT_AMBULATORY_CARE_PROVIDER_SITE_OTHER): Payer: Medicare Other

## 2019-02-06 VITALS — BP 132/67 | HR 89 | Temp 97.6°F | Wt 236.0 lb

## 2019-02-06 DIAGNOSIS — M654 Radial styloid tenosynovitis [de Quervain]: Secondary | ICD-10-CM

## 2019-02-06 DIAGNOSIS — M19031 Primary osteoarthritis, right wrist: Secondary | ICD-10-CM | POA: Diagnosis not present

## 2019-02-06 DIAGNOSIS — M25531 Pain in right wrist: Secondary | ICD-10-CM

## 2019-02-06 LAB — COMPLETE METABOLIC PANEL WITH GFR
AG Ratio: 1.3 (calc) (ref 1.0–2.5)
ALT: 15 U/L (ref 6–29)
AST: 19 U/L (ref 10–35)
Albumin: 4 g/dL (ref 3.6–5.1)
Alkaline phosphatase (APISO): 85 U/L (ref 37–153)
BUN: 12 mg/dL (ref 7–25)
CO2: 26 mmol/L (ref 20–32)
Calcium: 9.2 mg/dL (ref 8.6–10.4)
Chloride: 106 mmol/L (ref 98–110)
Creat: 0.83 mg/dL (ref 0.50–0.99)
GFR, Est African American: 85 mL/min/{1.73_m2} (ref >=60)
GFR, Est Non African American: 73 mL/min/{1.73_m2} (ref >=60)
Globulin: 3 g/dL (calc) (ref 1.9–3.7)
Glucose, Bld: 95 mg/dL (ref 65–99)
Potassium: 4.1 mmol/L (ref 3.5–5.3)
Sodium: 142 mmol/L (ref 135–146)
Total Bilirubin: 0.3 mg/dL (ref 0.2–1.2)
Total Protein: 7 g/dL (ref 6.1–8.1)

## 2019-02-06 LAB — LIPID PANEL
Cholesterol: 151 mg/dL (ref <200)
HDL: 37 mg/dL — ABNORMAL LOW (ref >=50)
LDL Cholesterol (Calc): 90 mg/dL (calc)
Non-HDL Cholesterol (Calc): 114 mg/dL (calc) (ref <130)
Total CHOL/HDL Ratio: 4.1 (calc) (ref <5.0)
Triglycerides: 139 mg/dL (ref <150)

## 2019-02-06 MED ORDER — DICLOFENAC SODIUM 1 % TD GEL: 4.0000 g | Freq: Four times a day (QID) | TRANSDERMAL | 11 refills | Status: DC

## 2019-02-06 NOTE — Progress Notes (Signed)
Amanda Morris is a 68 y.o. female who presents to [facility] today for right wrist pain.  Right wrist pain: Patient had surgery in August 2019 and has had wrist pain since the IV was inserted into her right wrist prior to surgery.  She was having dorsal wrist pain and swelling.  I saw her on April 11, 2018.  At that time limited musculoskeletal ultrasound showed hypoechoic cystic structure superficial to the first dorsal wrist compartment consistent with ganglion cyst and hypoechoic fluid tracking along tendon sheath consistent with de Quervain's tenosynovitis.  She had ultrasound-guided injection of the ganglion cyst and tendon sheath.  She notes that this did not help much at all with pain relief only lasting about a day.  Since then she has used a thumb spica splint intermittently and used over-the-counter medications for pain.  She notes continued bothersome radial wrist pain.  She notes this is worse with activity such as cooking and cleaning.   ROS:  As above  Exam:  BP 132/67    Pulse 89    Temp 97.6 F (36.4 C) (Oral)    Wt 236 lb (107 kg)    BMI 40.51 kg/m  Wt Readings from Last 5 Encounters:  02/06/19 236 lb (107 kg)  02/05/19 235 lb (106.6 kg)  12/08/18 239 lb (108.4 kg)  11/28/18 236 lb (107 kg)  08/14/18 227 lb (103 kg)   General: Well Developed, well nourished, and in no acute distress.  Neuro/Psych: Alert and oriented x3, extra-ocular muscles intact, able to move all 4 extremities, sensation grossly intact. Skin: Warm and dry, no rashes noted.  Respiratory: Not using accessory muscles, speaking in full sentences, trachea midline.  Cardiovascular: Pulses palpable, no extremity edema. Abdomen: Does not appear distended. MSK: Right wrist normal-appearing without significant deformity or swelling. Tender to palpation along dorsal radial wrist at radial styloid. Normal wrist motion.  Positive Finkelstein's  test.    Lab and Radiology Results No results found. X-ray images right wrist obtained today personally independently reviewed Mild degenerative changes present in the wrist with no acute fractures or malalignment. Await formal radiology review  Procedure: Real-time Ultrasound Guided Injection of first dorsal wrist compartment de Quervain's injection Device: GE Logiq E   Images permanently stored and available for review in the ultrasound unit. Verbal informed consent obtained.  Discussed risks and benefits of procedure. Warned about infection bleeding damage to structures skin hypopigmentation and fat atrophy among others. Patient expresses understanding and agreement Time-out conducted.   Noted no overlying erythema, induration, or other signs of local infection.   Skin prepped in a sterile fashion.   Local anesthesia: Topical Ethyl chloride.   With sterile technique and under real time ultrasound guidance:  40 mg of Kenalog and 1 mL of lidocaine injected easily.   Completed without difficulty   Pain immediately resolved suggesting accurate placement of the medication.   Advised to call if fevers/chills, erythema, induration, drainage, or persistent bleeding.   Images permanently stored and available for review in the ultrasound unit.  Impression: Technically successful ultrasound guided injection.       Assessment and Plan: 68 y.o. female with wrist pain secondary to de Quervain's tenosynovitis.  Ultimate cause is probably IV insertion into the tendon sheath back in 2019.  She is not done well with relative rest bracing and trial of injection 10 months ago.  Plan for repeat injection and trial of diclofenac gel.  If no benefit would proceed  with trial of physical therapy.  Ultimately if still not better next step would be referral to hand surgery.  Patient cannot have MRI due to spine stimulator therefore would hold off on advanced imaging before referral.   PDMP not reviewed  this encounter. Orders Placed This Encounter  Procedures   DG Wrist Complete Right    Standing Status:   Future    Number of Occurrences:   1    Standing Expiration Date:   04/07/2020    Order Specific Question:   Reason for Exam (SYMPTOM  OR DIAGNOSIS REQUIRED)    Answer:   right dorsal wrist pain >1 year.    Order Specific Question:   Preferred imaging location?    Answer:   [facility]    Order Specific Question:   Radiology Contrast Protocol - do NOT remove file path    Answer:   [email or web address]   Meds ordered this encounter  Medications   diclofenac sodium (VOLTAREN) 1 % GEL    Sig: Apply 4 g topically 4 (four) times daily. To affected joint.    Dispense:  100 g    Refill:  11    Historical information moved to improve visibility of documentation.  Past Medical History:  Diagnosis Date   Allergy    Diabetes mellitus without complication (H[facility])    GERD (gastroesophageal reflux disease)    Headache    migraines last one 2 weeks ago   History of hiatal hernia    Hypercholesterolemia    Palpitations    in the past, no current issues per patient   Postlaminectomy syndrome    Past Surgical History:  Procedure Laterality Date   ABDOMINAL HYSTERECTOMY  1982   BACK SURGERY     BREAST REDUCTION SURGERY Bilateral 03/17/2016   Procedure: MAMMARY REDUCTION  (BREAST);  Surgeon: Wallace Going, DO;  Location: [facility];  Service: Plastics;  Laterality: Bilateral;   SHOULDER SURGERY  06/2008, 09/2010   Right, by Dr. Karie Soda, then Dr. Judeth Horn   SPINAL CORD STIMULATOR INSERTION N/A 12/02/2017   Procedure: LUMBAR SPINAL CORD STIMULATOR INSERTION;  Surgeon: Ashok Pall, MD;  Location: [facility];  Service: Neurosurgery;  Laterality: N/A;   SPINAL CORD STIMULATOR TRIAL N/A 11/25/2017   Procedure: INSERTION LUMBAR SPINAL CORD STIMULATOR TRIAL  ;  Surgeon: Ashok Pall, MD;  Location: [facility];  Service:  Neurosurgery;  Laterality: N/A;   INSERTION LUMBAR SPINAL CORD STIMULATOR TRIAL     Social History   Tobacco Use   Smoking status: Former Smoker    Types: Cigarettes    Quit date: 07/30/2007    Years since quitting: 11.5   Smokeless tobacco: Never Used  Substance Use Topics   Alcohol use: No   family history includes Alcohol abuse in her brother; Diabetes in her mother and sister; Heart disease in her father, mother, and sister; Hyperlipidemia in her brother and sister; Hypertension in her mother and sister; Stroke in her mother and sister.  Medications: Current Outpatient Medications  Medication Sig Dispense Refill   ACCU-CHEK AVIVA PLUS test strip FOR TESTING BLOOD SUGARS 3 TIMES DAILY. DX:E11.9 300 strip 12   Accu-Chek Softclix Lancets lancets USE AS DIRECTED TO TEST BLOOD SUGAR 3 TIMES A DAY 100 each prn   aspirin EC 81 MG tablet Take 1 tablet (81 mg total) by mouth daily.     B-D ULTRAFINE III SHORT PEN 31G X 8 MM MISC USE AS DIRECTED 100 each 6   blood glucose meter  kit and supplies KIT Dispense glucometer, test strips, and lancets. Use to check blood sugar twice daily. Dx: E11.9 1 each 0   cholecalciferol (VITAMIN D) 1000 units tablet Take 1,000 Units by mouth daily.     DEXILANT 60 MG capsule Take 60 mg by mouth daily.   3   dicyclomine (BENTYL) 10 MG capsule Take 10 mg by mouth 4 (four) times daily.      doxepin (SINEQUAN) 10 MG capsule Take 10 mg by mouth at bedtime.     famotidine (PEPCID) 40 MG tablet Take 40 mg by mouth 3 (three) times daily.   5   fluticasone (FLONASE) 50 MCG/ACT nasal spray Place 1 spray into both nostrils daily. 48 mL 4   gabapentin (NEURONTIN) 300 MG capsule TAKE 1 CAPSULE (300 MG TOTAL) BY MOUTH 3 (THREE) TIMES DAILY. MAX OF 3600MG/DAY 540 capsule 2   glipiZIDE (GLUCOTROL) 10 MG tablet Take 1 tablet (10 mg total) by mouth 2 (two) times daily. 180 tablet 1   JARDIANCE 25 MG TABS tablet TAKE 25 MG BY MOUTH DAILY. 90 tablet 1   LANTUS  SOLOSTAR 100 UNIT/ML Solostar Pen INJECT 36 UNITS INTO THE SKIN DAILY AT 10 PM. 15 mL 33   levocetirizine (XYZAL) 5 MG tablet TAKE 1 TABLET BY MOUTH EVERY DAY IN THE EVENING 90 tablet 3   meloxicam (MOBIC) 15 MG tablet Take 1 tablet (15 mg total) by mouth daily. 30 tablet 0   Multiple Vitamins-Minerals (MULTIVITAMIN WOMEN 50+ PO) Take 1 capsule by mouth daily.     omeprazole (PRILOSEC) 40 MG capsule Take by mouth.     perphenazine (TRILAFON) 8 MG tablet TAKE ONE TABLET (8 MG TOTAL) BY MOUTH AT BEDTIME.  2   pravastatin (PRAVACHOL) 40 MG tablet TAKE 1 TABLET BY MOUTH AT BEDTIME 90 tablet 3   tiZANidine (ZANAFLEX) 4 MG tablet Take 1 tablet (4 mg total) by mouth every 6 (six) hours as needed for muscle spasms. 30 tablet 0   diclofenac sodium (VOLTAREN) 1 % GEL Apply 4 g topically 4 (four) times daily. To affected joint. 100 g 11   No current facility-administered medications for this visit.    Allergies  Allergen Reactions   Penicillins Hives and Other (See Comments)    PATIENT HAS HAD A PCN REACTION WITH IMMEDIATE RASH, FACIAL/TONGUE/THROAT SWELLING, SOB, OR LIGHTHEADEDNESS WITH HYPOTENSION:  #  #  YES  #  #  Has patient had a PCN reaction causing severe rash involving mucus membranes or skin necrosis: No Has patient had a PCN reaction that required hospitalization: No Has patient had a PCN reaction occurring within the last 10 years: No If all of the above answers are "NO", then may proceed with Cephalosporin use.    Aspirin Nausea Only   Codeine Itching   Hydrocodone Nausea Only   Metformin And Related Diarrhea and Nausea Only   Oxycodone Nausea And Vomiting    Other reaction(s): GI Upset (intolerance)   Victoza [Liraglutide] Other (See Comments)    Abdominal pain      Discussed warning signs or symptoms. Please see discharge instructions. Patient expresses understanding.

## 2019-02-06 NOTE — Progress Notes (Signed)
All labs are normal. 

## 2019-02-06 NOTE — Patient Instructions (Signed)
Thank you for coming in today. Call or go to the ER if you develop a large red swollen joint with extreme pain or oozing puss.  Use the brace as needed.  Use the topical medicine.  If not getting better next step is hand PT/OT.  Let me know if not better.  If not better at all after 2 injection then hand surgery evaluation is next.

## 2019-02-14 DIAGNOSIS — K589 Irritable bowel syndrome without diarrhea: Secondary | ICD-10-CM | POA: Diagnosis not present

## 2019-02-14 DIAGNOSIS — R1084 Generalized abdominal pain: Secondary | ICD-10-CM | POA: Diagnosis not present

## 2019-02-14 DIAGNOSIS — R1013 Epigastric pain: Secondary | ICD-10-CM | POA: Diagnosis not present

## 2019-02-14 DIAGNOSIS — Z86010 Personal history of colonic polyps: Secondary | ICD-10-CM | POA: Diagnosis not present

## 2019-02-21 DIAGNOSIS — K449 Diaphragmatic hernia without obstruction or gangrene: Secondary | ICD-10-CM | POA: Diagnosis not present

## 2019-02-21 DIAGNOSIS — R1030 Lower abdominal pain, unspecified: Secondary | ICD-10-CM | POA: Diagnosis not present

## 2019-03-08 ENCOUNTER — Encounter: Payer: Self-pay | Admitting: Family Medicine

## 2019-03-12 ENCOUNTER — Telehealth: Payer: Self-pay

## 2019-03-12 NOTE — Telephone Encounter (Signed)
Amanda Morris called and states she is having weight loss surgery. She is going to have the sleeve.

## 2019-04-11 ENCOUNTER — Other Ambulatory Visit: Payer: Self-pay | Admitting: Family Medicine

## 2019-04-12 DIAGNOSIS — K219 Gastro-esophageal reflux disease without esophagitis: Secondary | ICD-10-CM | POA: Diagnosis not present

## 2019-04-12 DIAGNOSIS — E559 Vitamin D deficiency, unspecified: Secondary | ICD-10-CM | POA: Diagnosis not present

## 2019-04-12 DIAGNOSIS — E1169 Type 2 diabetes mellitus with other specified complication: Secondary | ICD-10-CM | POA: Diagnosis not present

## 2019-05-07 ENCOUNTER — Ambulatory Visit (INDEPENDENT_AMBULATORY_CARE_PROVIDER_SITE_OTHER): Payer: Medicare Other | Admitting: Family Medicine

## 2019-05-07 ENCOUNTER — Encounter: Payer: Self-pay | Admitting: Family Medicine

## 2019-05-07 VITALS — BP 115/76 | HR 94 | Ht 64.0 in | Wt 238.0 lb

## 2019-05-07 DIAGNOSIS — E1169 Type 2 diabetes mellitus with other specified complication: Secondary | ICD-10-CM

## 2019-05-07 DIAGNOSIS — Z6841 Body Mass Index (BMI) 40.0 and over, adult: Secondary | ICD-10-CM | POA: Insufficient documentation

## 2019-05-07 DIAGNOSIS — I10 Essential (primary) hypertension: Secondary | ICD-10-CM

## 2019-05-07 DIAGNOSIS — M961 Postlaminectomy syndrome, not elsewhere classified: Secondary | ICD-10-CM | POA: Diagnosis not present

## 2019-05-07 LAB — POCT GLYCOSYLATED HEMOGLOBIN (HGB A1C): Hemoglobin A1C: 6.8 % — AB (ref 4.0–5.6)

## 2019-05-07 NOTE — Assessment & Plan Note (Signed)
Considering gastric sleeve and has follow-up with bariatric surgery next month.  She will let me know what she decides.  Did discuss that sometimes it can significantly improve, and sometimes even reverse diabetes.

## 2019-05-07 NOTE — Progress Notes (Signed)
Established Patient Office Visit  Subjective:  Patient ID: Marvetta Vohs, female    DOB: [birth year]  Age: 69 y.o. MRN: [id number]  CC:  Chief Complaint  Patient presents with  . Diabetes  . Hypertension    HPI Aamya Orellana presents for   Hypertension- Pt denies chest pain, SOB, dizziness, or heart palpitations.  Taking meds as directed w/o problems.  Denies medication side effects.    Diabetes - no hypoglycemic events. No wounds or sores that are not healing well. No increased thirst or urination. Checking glucose at home. Taking medications as prescribed without any side effects.  F/U failed back surgery   - she has been doing better overall.  She has also been consulting with [facility] attrition and is considering a gastric sleeve when the let us know.  She actually has another follow-up next month to discuss possible surgery.  She said her goal would be to lose weight be more active and get off of a lot of her medications if at all possible.  Past Medical History:  Diagnosis Date  . Allergy   . Diabetes mellitus without complication ([place])   . GERD (gastroesophageal reflux disease)   . Headache    migraines last one 2 weeks ago  . History of hiatal hernia   . Hypercholesterolemia   . Palpitations    in the past, no current issues per patient  . Postlaminectomy syndrome     Past Surgical History:  Procedure Laterality Date  . ABDOMINAL HYSTERECTOMY  1982  . BACK SURGERY    . BREAST REDUCTION SURGERY Bilateral 03/17/2016   Procedure: MAMMARY REDUCTION  (BREAST);  Surgeon: Wallace Going, DO;  Location: [place];  Service: Plastics;  Laterality: Bilateral;  . SHOULDER SURGERY  06/2008, 09/2010   Right, by Dr. Karie Soda, then Dr. Judeth Horn  . SPINAL CORD STIMULATOR INSERTION N/A 12/02/2017   Procedure: LUMBAR SPINAL CORD STIMULATOR INSERTION;  Surgeon: Ashok Pall, MD;  Location: [place];  Service: Neurosurgery;  Laterality: N/A;  . SPINAL  CORD STIMULATOR TRIAL N/A 11/25/2017   Procedure: INSERTION LUMBAR SPINAL CORD STIMULATOR TRIAL  ;  Surgeon: Ashok Pall, MD;  Location: [place];  Service: Neurosurgery;  Laterality: N/A;   INSERTION LUMBAR SPINAL CORD STIMULATOR TRIAL      Family History  Problem Relation Age of Onset  . Heart disease Mother   . Diabetes Mother   . Hypertension Mother   . Stroke Mother   . Heart disease Father   . Heart disease Sister   . Diabetes Sister   . Hyperlipidemia Sister   . Hypertension Sister   . Stroke Sister   . Alcohol abuse Brother   . Hyperlipidemia Brother     Social History   Socioeconomic History  . Marital status: Divorced    Spouse name: Not on file  . Number of children: 1  . Years of education: 9th grade  . Highest education level: 9th grade  Occupational History  . Occupation: Disabled.     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 Smoking status: Former Smoker    Types: Cigarettes    Quit date: 07/30/2007    Years since quitting: 11.7  . Smokeless tobacco: Never Used  Substance and Sexual Activity  . Alcohol use: No  . Drug use: No  . Sexual activity: Never  Other Topics Concern  . Not on file  Social History Narrative   Caffeine intake. Some regular exercise. Works  part time at the school cafeteria   Social Determinants of Health   Financial Resource Strain: Low Risk   . Difficulty of Paying Living Expenses: Not very hard  Food Insecurity:   . Worried About Charity fundraiser in the Last Year: Not on file  . Ran Out of Food in the Last Year: Not on file  Transportation Needs:   . Lack of Transportation (Medical): Not on file  . Lack of Transportation (Non-Medical): Not on file  Physical Activity: Insufficiently Active  . Days of Exercise per Week: 3 days  . Minutes of Exercise per Session: 30 min  Stress:   . Feeling of Stress : Not on file  Social Connections: Unknown  . Frequency of Communication with Friends and Family: Not on file   . Frequency of Social Gatherings with Friends and Family: Not on file  . Attends Religious Services: Not on file  . Active Member of Clubs or Organizations: No  . Attends Archivist Meetings: Not on file  . Marital Status: Not on file  Intimate Partner Violence:   . Fear of Current or Ex-Partner: Not on file  . Emotionally Abused: Not on file  . Physically Abused: Not on file  . Sexually Abused: Not on file    Outpatient Medications Prior to Visit  Medication Sig Dispense Refill  . ACCU-CHEK AVIVA PLUS test strip FOR TESTING BLOOD SUGARS 3 TIMES DAILY. DX:E11.9 300 strip 12  . Accu-Chek Softclix Lancets lancets USE AS DIRECTED TO TEST BLOOD SUGAR 3 TIMES A DAY 100 each prn  . aspirin EC 81 MG tablet Take 1 tablet (81 mg total) by mouth daily.    . B-D ULTRAFINE III SHORT PEN 31G X 8 MM MISC USE AS DIRECTED 100 each 6  . blood glucose meter kit and supplies KIT Dispense glucometer, test strips, and lancets. Use to check blood sugar twice daily. Dx: E11.9 1 each 0  . cholecalciferol (VITAMIN D) 1000 units tablet Take 1,000 Units by mouth daily.    Marland Kitchen DEXILANT 60 MG capsule Take 60 mg by mouth daily.   3  . diclofenac sodium (VOLTAREN) 1 % GEL Apply 4 g topically 4 (four) times daily. To affected joint. 100 g 11  . dicyclomine (BENTYL) 10 MG capsule Take 10 mg by mouth 4 (four) times daily.     Marland Kitchen doxepin (SINEQUAN) 10 MG capsule Take 10 mg by mouth at bedtime.    . famotidine (PEPCID) 40 MG tablet Take 40 mg by mouth at bedtime.    . fluticasone (FLONASE) 50 MCG/ACT nasal spray Place 1 spray into both nostrils daily. 48 mL 4  . gabapentin (NEURONTIN) 300 MG capsule TAKE 1 CAPSULE (300 MG TOTAL) BY MOUTH 3 (THREE) TIMES DAILY. MAX OF 3600MG/DAY 540 capsule 2  . glipiZIDE (GLUCOTROL) 10 MG tablet Take 1 tablet (10 mg total) by mouth 2 (two) times daily. 180 tablet 1  . Insulin Glargine (LANTUS SOLOSTAR) 100 UNIT/ML Solostar Pen Inject 38 Units into the skin at bedtime.    Marland Kitchen  JARDIANCE 25 MG TABS tablet TAKE 25 MG BY MOUTH DAILY. 90 tablet 1  . levocetirizine (XYZAL) 5 MG tablet TAKE 1 TABLET BY MOUTH EVERY DAY IN THE EVENING 90 tablet 3  . Multiple Vitamins-Minerals (MULTIVITAMIN WOMEN 50+ PO) Take 1 capsule by mouth daily.    . naproxen sodium (ALEVE) 220 MG tablet Take 220 mg by mouth daily as needed.    . Omega-3 1000 MG CAPS Take  1 capsule by mouth daily.    Marland Kitchen perphenazine (TRILAFON) 8 MG tablet TAKE ONE TABLET (8 MG TOTAL) BY MOUTH AT BEDTIME.  2  . pravastatin (PRAVACHOL) 40 MG tablet TAKE 1 TABLET BY MOUTH AT BEDTIME 90 tablet 3  . omeprazole (PRILOSEC) 40 MG capsule Take by mouth.    . famotidine (PEPCID) 40 MG tablet Take 40 mg by mouth 3 (three) times daily.   5  . LANTUS SOLOSTAR 100 UNIT/ML Solostar Pen INJECT 36 UNITS INTO THE SKIN DAILY AT 10 PM. 15 mL 33  . meloxicam (MOBIC) 15 MG tablet Take 1 tablet (15 mg total) by mouth daily. 30 tablet 0  . tiZANidine (ZANAFLEX) 4 MG tablet Take 1 tablet (4 mg total) by mouth every 6 (six) hours as needed for muscle spasms. 30 tablet 0   No facility-administered medications prior to visit.    Allergies  Allergen Reactions  . Penicillins Hives and Other (See Comments)    PATIENT HAS HAD A PCN REACTION WITH IMMEDIATE RASH, FACIAL/TONGUE/THROAT SWELLING, SOB, OR LIGHTHEADEDNESS WITH HYPOTENSION:  #  #  YES  #  #  Has patient had a PCN reaction causing severe rash involving mucus membranes or skin necrosis: No Has patient had a PCN reaction that required hospitalization: No Has patient had a PCN reaction occurring within the last 10 years: No If all of the above answers are "NO", then may proceed with Cephalosporin use.   . Aspirin Nausea Only  . Codeine Itching  . Hydrocodone Nausea Only  . Metformin And Related Diarrhea and Nausea Only  . Oxycodone Nausea And Vomiting    Other reaction(s): GI Upset (intolerance)  . Victoza [Liraglutide] Other (See Comments)    Abdominal pain    ROS Review of  Systems    Objective:    Physical Exam  Constitutional: She is oriented to person, place, and time. She appears well-developed and well-nourished.  HENT:  Head: Normocephalic and atraumatic.  Cardiovascular: Normal rate, regular rhythm and normal heart sounds.  Pulmonary/Chest: Effort normal and breath sounds normal.  Neurological: She is alert and oriented to person, place, and time.  Skin: Skin is warm and dry.  Psychiatric: She has a normal mood and affect. Her behavior is normal.    BP 115/76   Pulse 94   Ht '5\' 4"'$  (1.626 m)   Wt 238 lb (108 kg)   SpO2 98%   BMI 40.85 kg/m  Wt Readings from Last 3 Encounters:  05/07/19 238 lb (108 kg)  02/06/19 236 lb (107 kg)  02/05/19 235 lb (106.6 kg)     There are no preventive care reminders to display for this patient.  There are no preventive care reminders to display for this patient.  Lab Results  Component Value Date   TSH 1.45 01/22/2015   Lab Results  Component Value Date   WBC 9.7 11/21/2017   HGB 12.8 11/21/2017   HCT 42.9 11/21/2017   MCV 80.9 11/21/2017   PLT 273 11/21/2017   Lab Results  Component Value Date   NA 142 02/05/2019   K 4.1 02/05/2019   CO2 26 02/05/2019   GLUCOSE 95 02/05/2019   BUN 12 02/05/2019   CREATININE 0.83 02/05/2019   BILITOT 0.3 02/05/2019   ALKPHOS 102 09/21/2016   AST 19 02/05/2019   ALT 15 02/05/2019   PROT 7.0 02/05/2019   ALBUMIN 3.8 09/21/2016   CALCIUM 9.2 02/05/2019   ANIONGAP 12 11/21/2017   Lab Results  Component Value  Date   CHOL 151 02/05/2019   Lab Results  Component Value Date   HDL 37 (L) 02/05/2019   Lab Results  Component Value Date   LDLCALC 90 02/05/2019   Lab Results  Component Value Date   TRIG 139 02/05/2019   Lab Results  Component Value Date   CHOLHDL 4.1 02/05/2019   Lab Results  Component Value Date   HGBA1C 6.8 (A) 05/07/2019      Assessment & Plan:   Problem List Items Addressed This Visit      Cardiovascular and  Mediastinum   Essential hypertension - Primary    Well controlled. Continue current regimen. Follow up in  4 mo.         Endocrine   Type 2 diabetes mellitus with other specified complication (HCC)    Well controlled. Continue current regimen. Follow up in  3 mo. okay to push out to 4 months if she ends up having the bariatric surgery before then.      Relevant Medications   Insulin Glargine (LANTUS SOLOSTAR) 100 UNIT/ML Solostar Pen   Other Relevant Orders   POCT HgB A1C (Completed)     Other   Failed back syndrome of lumbar spine    Doing oK overall.       BMI 40.0-44.9, adult [facility])    Considering gastric sleeve and has follow-up with bariatric surgery next month.  She will let me know what she decides.  Did discuss that sometimes it can significantly improve, and sometimes even reverse diabetes.      Relevant Medications   Insulin Glargine (LANTUS SOLOSTAR) 100 UNIT/ML Solostar Pen      No orders of the defined types were placed in this encounter.   Follow-up: Return in about 3 months (around 08/05/2019) for Diabetes follow-up and labs.    Beatrice Lecher, MD

## 2019-05-07 NOTE — Assessment & Plan Note (Signed)
Well controlled. Continue current regimen. Follow up in  4 mo 

## 2019-05-07 NOTE — Assessment & Plan Note (Signed)
Doing oK overall.

## 2019-05-07 NOTE — Assessment & Plan Note (Signed)
Well controlled. Continue current regimen. Follow up in  3 mo. okay to push out to 4 months if she ends up having the bariatric surgery before then.

## 2019-05-10 DIAGNOSIS — K589 Irritable bowel syndrome without diarrhea: Secondary | ICD-10-CM | POA: Diagnosis not present

## 2019-05-10 DIAGNOSIS — R1013 Epigastric pain: Secondary | ICD-10-CM | POA: Diagnosis not present

## 2019-05-10 DIAGNOSIS — Z86010 Personal history of colonic polyps: Secondary | ICD-10-CM | POA: Diagnosis not present

## 2019-05-10 DIAGNOSIS — R1084 Generalized abdominal pain: Secondary | ICD-10-CM | POA: Diagnosis not present

## 2019-05-23 ENCOUNTER — Telehealth: Payer: Self-pay

## 2019-05-23 NOTE — Telephone Encounter (Signed)
Amanda Morris called and wanted to know if she could take [place] weight loss medication with her current medications. I did look it up but could not find a list of the ingredients.

## 2019-05-24 NOTE — Telephone Encounter (Signed)
Yes, Ok to take.  Mostly herbs and minerals.

## 2019-05-25 NOTE — Telephone Encounter (Signed)
Pt advised.

## 2019-06-19 DIAGNOSIS — R000 Tachycardia, unspecified: Secondary | ICD-10-CM | POA: Diagnosis not present

## 2019-06-19 DIAGNOSIS — R5383 Other fatigue: Secondary | ICD-10-CM | POA: Diagnosis not present

## 2019-06-19 DIAGNOSIS — E538 Deficiency of other specified B group vitamins: Secondary | ICD-10-CM | POA: Diagnosis not present

## 2019-06-19 DIAGNOSIS — E785 Hyperlipidemia, unspecified: Secondary | ICD-10-CM | POA: Diagnosis not present

## 2019-06-19 DIAGNOSIS — D509 Iron deficiency anemia, unspecified: Secondary | ICD-10-CM | POA: Diagnosis not present

## 2019-06-19 DIAGNOSIS — E559 Vitamin D deficiency, unspecified: Secondary | ICD-10-CM | POA: Diagnosis not present

## 2019-06-23 ENCOUNTER — Other Ambulatory Visit: Payer: Self-pay | Admitting: Family Medicine

## 2019-06-23 DIAGNOSIS — E119 Type 2 diabetes mellitus without complications: Secondary | ICD-10-CM

## 2019-06-23 DIAGNOSIS — I10 Essential (primary) hypertension: Secondary | ICD-10-CM

## 2019-06-27 ENCOUNTER — Other Ambulatory Visit: Payer: Self-pay | Admitting: Family Medicine

## 2019-07-07 ENCOUNTER — Other Ambulatory Visit: Payer: Self-pay | Admitting: Family Medicine

## 2019-07-07 DIAGNOSIS — E119 Type 2 diabetes mellitus without complications: Secondary | ICD-10-CM

## 2019-07-07 DIAGNOSIS — I10 Essential (primary) hypertension: Secondary | ICD-10-CM

## 2019-07-10 NOTE — Progress Notes (Deleted)
Subjective:   Amanda Morris is a 69 y.o. female who presents for Medicare Annual (Subsequent) preventive examination.  Review of Systems:  No ROS.  Medicare Wellness Virtual Visit.  Visual/audio telehealth visit, UTA vital signs.   See social history for additional risk factors.      Sleep patterns:    Home Safety/Smoke Alarms: Feels safe in home. Smoke alarms in place.  Living environment;  Seat Belt Safety/Bike Helmet: Wears seat belt.   Female:   Pap-  Aged out     Mammo- UTD      Dexa scan- UTD       CCS- UTD     Objective:     Vitals: There were no vitals taken for this visit.  There is no height or weight on file to calculate BMI.  Advanced Directives 07/11/2018 12/02/2017 11/25/2017 11/21/2017 10/17/2017 03/17/2016 06/13/2015  Does Patient Have a Medical Advance Directive? No No No No Yes No No  Type of Advance Directive - - - - [place] in Chart? - - - - No - copy requested - -  Would patient like information on creating a medical advance directive? Yes (MAU/Ambulatory/Procedural Areas - Information given) No - Patient declined No - Patient declined No - Patient declined - - No - patient declined information    Tobacco Social History   Tobacco Use  Smoking Status Former Smoker  . Types: Cigarettes  . Quit date: 07/30/2007  . Years since quitting: 11.9  Smokeless Tobacco Never Used     Counseling given: Not Answered   Clinical Intake:                       Past Medical History:  Diagnosis Date  . Allergy   . Diabetes mellitus without complication ([place])   . GERD (gastroesophageal reflux disease)   . Headache    migraines last one 2 weeks ago  . History of hiatal hernia   . Hypercholesterolemia   . Palpitations    in the past, no current issues per patient  . Postlaminectomy syndrome    Past Surgical History:  Procedure Laterality Date  . ABDOMINAL HYSTERECTOMY  1982  .  BACK SURGERY    . BREAST REDUCTION SURGERY Bilateral 03/17/2016   Procedure: MAMMARY REDUCTION  (BREAST);  Surgeon: Wallace Going, DO;  Location: [place];  Service: Plastics;  Laterality: Bilateral;  . SHOULDER SURGERY  06/2008, 09/2010   Right, by Dr. Karie Soda, then Dr. Judeth Horn  . SPINAL CORD STIMULATOR INSERTION N/A 12/02/2017   Procedure: LUMBAR SPINAL CORD STIMULATOR INSERTION;  Surgeon: Ashok Pall, MD;  Location: [facility];  Service: Neurosurgery;  Laterality: N/A;  . SPINAL CORD STIMULATOR TRIAL N/A 11/25/2017   Procedure: INSERTION LUMBAR SPINAL CORD STIMULATOR TRIAL  ;  Surgeon: Ashok Pall, MD;  Location: [facility];  Service: Neurosurgery;  Laterality: N/A;   INSERTION LUMBAR SPINAL CORD STIMULATOR TRIAL     Family History  Problem Relation Age of Onset  . Heart disease Mother   . Diabetes Mother   . Hypertension Mother   . Stroke Mother   . Heart disease Father   . Heart disease Sister   . Diabetes Sister   . Hyperlipidemia Sister   . Hypertension Sister   . Stroke Sister   . Alcohol abuse Brother   . Hyperlipidemia Brother    Social History   Socioeconomic History  .  Marital status: Divorced    Spouse name: Not on file  . Number of children: 1  . Years of education: 9th grade  . Highest education level: 9th grade  Occupational History  . Occupation: Disabled.     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 Smoking status: Former Smoker    Types: Cigarettes    Quit date: 07/30/2007    Years since quitting: 11.9  . Smokeless tobacco: Never Used  Substance and Sexual Activity  . Alcohol use: No  . Drug use: No  . Sexual activity: Never  Other Topics Concern  . Not on file  Social History Narrative   Caffeine intake. Some regular exercise. Works part time at the [employer] Strain: Reading   . Difficulty of Paying Living Expenses: Not very hard  Food Insecurity:   .  Worried About Charity fundraiser in the Last Year:   . Arboriculturist in the Last Year:   Transportation Needs:   . Film/video editor (Medical):   Marland Kitchen Lack of Transportation (Non-Medical):   Physical Activity: Insufficiently Active  . Days of Exercise per Week: 3 days  . Minutes of Exercise per Session: 30 min  Stress:   . Feeling of Stress :   Social Connections: Unknown  . Frequency of Communication with Friends and Family: Not on file  . Frequency of Social Gatherings with Friends and Family: Not on file  . Attends Religious Services: Not on file  . Active Member of Clubs or Organizations: No  . Attends Archivist Meetings: Not on file  . Marital Status: Not on file    Outpatient Encounter Medications as of 07/16/2019  Medication Sig  . ACCU-CHEK AVIVA PLUS test strip FOR TESTING BLOOD SUGARS 3 TIMES DAILY. DX:E11.9  . Accu-Chek Softclix Lancets lancets USE AS DIRECTED TO TEST BLOOD SUGAR 3 TIMES A DAY  . aspirin EC 81 MG tablet Take 1 tablet (81 mg total) by mouth daily.  . B-D ULTRAFINE III SHORT PEN 31G X 8 MM MISC USE AS DIRECTED  . blood glucose meter kit and supplies KIT Dispense glucometer, test strips, and lancets. Use to check blood sugar twice daily. Dx: E11.9  . cholecalciferol (VITAMIN D) 1000 units tablet Take 1,000 Units by mouth daily.  Marland Kitchen DEXILANT 60 MG capsule Take 60 mg by mouth daily.   . diclofenac sodium (VOLTAREN) 1 % GEL Apply 4 g topically 4 (four) times daily. To affected joint.  Marland Kitchen dicyclomine (BENTYL) 10 MG capsule Take 10 mg by mouth 4 (four) times daily.   Marland Kitchen doxepin (SINEQUAN) 10 MG capsule Take 10 mg by mouth at bedtime.  . famotidine (PEPCID) 40 MG tablet Take 40 mg by mouth at bedtime.  . fluticasone (FLONASE) 50 MCG/ACT nasal spray Place 1 spray into both nostrils daily.  Marland Kitchen gabapentin (NEURONTIN) 300 MG capsule TAKE 1 CAPSULE (300 MG TOTAL) BY MOUTH 3 (THREE) TIMES DAILY. MAX OF '3600MG'$ /DAY  . glipiZIDE (GLUCOTROL) 10 MG tablet TAKE 1  TABLET BY MOUTH TWICE A DAY  . Insulin Glargine (LANTUS SOLOSTAR) 100 UNIT/ML Solostar Pen Inject 38 Units into the skin at bedtime.  Marland Kitchen JARDIANCE 25 MG TABS tablet TAKE 25 MG BY MOUTH DAILY.  Marland Kitchen levocetirizine (XYZAL) 5 MG tablet TAKE 1 TABLET BY MOUTH EVERY DAY IN THE EVENING  . Multiple Vitamins-Minerals (MULTIVITAMIN WOMEN 50+ PO) Take 1 capsule by mouth daily.  Marland Kitchen  naproxen sodium (ALEVE) 220 MG tablet Take 220 mg by mouth daily as needed.  . Omega-3 1000 MG CAPS Take 1 capsule by mouth daily.  Marland Kitchen perphenazine (TRILAFON) 8 MG tablet TAKE ONE TABLET (8 MG TOTAL) BY MOUTH AT BEDTIME.  . pravastatin (PRAVACHOL) 40 MG tablet TAKE 1 TABLET BY MOUTH EVERYDAY AT BEDTIME   No facility-administered encounter medications on file as of 07/16/2019.    Activities of Daily Living In your present state of health, do you have any difficulty performing the following activities: 07/11/2018  Hearing? N  Vision? Y  Comment at night has difficulty. No diabetic retinopathy  Difficulty concentrating or making decisions? N  Walking or climbing stairs? N  Dressing or bathing? N  Doing errands, shopping? N  Preparing Food and eating ? N  Using the Toilet? N  In the past six months, have you accidently leaked urine? Y  Comment has been going on for a while, when sneezes or coughs will have some urine leak.  Do you have problems with loss of bowel control? N  Managing your Medications? N  Managing your Finances? N  Housekeeping or managing your Housekeeping? N  Some recent data might be hidden    Patient Care Team: Hali Marry, MD as PCP - General (Family Medicine) Eulis Canner (Gastroenterology)    Assessment:   This is a routine wellness examination for Abigayl.Physical assessment deferred to PCP.  Exercise Activities and Dietary recommendations   Diet Breakfast: Lunch:  Dinner:       Goals    . Weight (lb) < 200 lb (90.7 kg)     Wants to lose 30 - 50 lbs in the year 2020        Fall Risk Fall Risk  05/07/2019 07/11/2018 07/19/2017 01/18/2017 09/21/2016  Falls in the past year? 0 0 No Yes Yes  Number falls in past yr: 0 - - 1 1  Injury with Fall? 0 - - No No  Risk for fall due to : - - - Impaired balance/gait Other (Comment)  Risk for fall due to: Comment - - - - climbing up stairs  Follow up - - - Falls prevention discussed Falls prevention discussed   Is the patient's home free of loose throw rugs in walkways, pet beds, electrical cords, etc?   {Blank single:19197::"yes","no"}      Grab bars in the bathroom? {Blank single:19197::"yes","no"}      Handrails on the stairs?   {Blank single:19197::"yes","no"}      Adequate lighting?   {Blank single:19197::"yes","no"}   Depression Screen PHQ 2/9 Scores 05/07/2019 02/05/2019 02/05/2019 10/05/2018  PHQ - 2 Score 0 2 0 0  PHQ- 9 Score - 8 0 -     Cognitive Function     6CIT Screen 07/11/2018 01/18/2017  What Year? 0 points 0 points  What month? 0 points 0 points  What time? 0 points 0 points  Count back from 20 0 points 2 points  Months in reverse 0 points 0 points  Repeat phrase 10 points 6 points  Total Score 10 8    Immunization History  Administered Date(s) Administered  . Influenza Split 03/02/2011, 02/25/2012  . Influenza, High Dose Seasonal PF 06/06/2017, 12/25/2017, 12/19/2018  . Influenza,inj,Quad PF,6+ Mos 02/13/2015, 12/23/2015, 12/22/2016  . Influenza-Unspecified 01/24/2013, 01/21/2014, 02/26/2015  . PPD Test 02/16/2017  . Pneumococcal Conjugate-13 02/14/2015  . Pneumococcal Polysaccharide-23 05/12/2005, 01/17/2018  . Td 07/07/2019  . Tdap 04/27/2007, 07/30/2017  . Zoster Recombinat (Shingrix) 04/01/2017, 08/10/2017  Screening Tests Health Maintenance  Topic Date Due  . URINE MICROALBUMIN  07/11/2019  . FOOT EXAM  07/11/2019  . OPHTHALMOLOGY EXAM  11/02/2019  . HEMOGLOBIN A1C  11/04/2019  . MAMMOGRAM  01/03/2021  . COLONOSCOPY  10/29/2021  . TETANUS/TDAP  07/06/2029  . INFLUENZA  VACCINE  Completed  . DEXA SCAN  Completed  . Hepatitis C Screening  Completed  . PNA vac Low Risk Adult  Completed        Plan:   ***   I have personally reviewed and noted the following in the patient's chart:   . Medical and social history . Use of alcohol, tobacco or illicit drugs  . Current medications and supplements . Functional ability and status . Nutritional status . Physical activity . Advanced directives . List of other physicians . Hospitalizations, surgeries, and ER visits in previous 12 months . Vitals . Screenings to include cognitive, depression, and falls . Referrals and appointments  In addition, I have reviewed and discussed with patient certain preventive protocols, quality metrics, and best practice recommendations. A written personalized care plan for preventive services as well as general preventive health recommendations were provided to patient.     Joanne Chars, LPN  11/04/5269

## 2019-07-16 ENCOUNTER — Other Ambulatory Visit: Payer: Self-pay

## 2019-07-16 ENCOUNTER — Ambulatory Visit: Payer: Medicare Other

## 2019-07-16 ENCOUNTER — Ambulatory Visit (INDEPENDENT_AMBULATORY_CARE_PROVIDER_SITE_OTHER): Payer: Medicare Other | Admitting: *Deleted

## 2019-07-16 VITALS — BP 127/73 | HR 86 | Ht 64.0 in | Wt 238.0 lb

## 2019-07-16 DIAGNOSIS — Z0000 Encounter for general adult medical examination without abnormal findings: Secondary | ICD-10-CM

## 2019-07-16 DIAGNOSIS — R300 Dysuria: Secondary | ICD-10-CM | POA: Diagnosis not present

## 2019-07-16 DIAGNOSIS — N760 Acute vaginitis: Secondary | ICD-10-CM

## 2019-07-16 LAB — POCT URINALYSIS DIPSTICK
Bilirubin, UA: NEGATIVE
Blood, UA: NEGATIVE
Glucose, UA: POSITIVE — AB
Ketones, UA: NEGATIVE
Leukocytes, UA: NEGATIVE
Nitrite, UA: NEGATIVE
Protein, UA: NEGATIVE
Spec Grav, UA: 1.01 (ref 1.010–1.025)
Urobilinogen, UA: 0.2 E.U./dL
pH, UA: 6.5 (ref 5.0–8.0)

## 2019-07-16 MED ORDER — FLUCONAZOLE 150 MG PO TABS: 150.0000 mg | ORAL_TABLET | Freq: Every day | ORAL | 1 refills | Status: DC

## 2019-07-16 NOTE — Patient Instructions (Addendum)
Please schedule your next medicare wellness visit with me in 1 yr.  Amanda Morris , Thank you for taking time to come for your Medicare Wellness Visit. I appreciate your ongoing commitment to your health goals. Please review the following plan we discussed and let me know if I can assist you in the future.  Continue doing brain stimulating activities (puzzles, reading, adult coloring books, staying active) to keep memory sharp.  These are the goals we discussed: Goals    . Weight (lb) < 200 lb (90.7 kg)     Wants to lose 30 - 50 lbs in the year 2020       Health Maintenance, Female Adopting a healthy lifestyle and getting preventive care are important in promoting health and wellness. Ask your health care provider about:  The right schedule for you to have regular tests and exams.  Things you can do on your own to prevent diseases and keep yourself healthy. What should I know about diet, weight, and exercise? Eat a healthy diet   Eat a diet that includes plenty of vegetables, fruits, low-fat dairy products, and lean protein.  Do not eat a lot of foods that are high in solid fats, added sugars, or sodium. Maintain a healthy weight Body mass index (BMI) is used to identify weight problems. It estimates body fat based on height and weight. Your health care provider can help determine your BMI and help you achieve or maintain a healthy weight. Get regular exercise Get regular exercise. This is one of the most important things you can do for your health. Most adults should:  Exercise for at least 150 minutes each week. The exercise should increase your heart rate and make you sweat (moderate-intensity exercise).  Do strengthening exercises at least twice a week. This is in addition to the moderate-intensity exercise.  Spend less time sitting. Even light physical activity can be beneficial. Watch cholesterol and blood lipids Have your blood tested for lipids and cholesterol at 67 years  of age, then have this test every 5 years. Have your cholesterol levels checked more often if:  Your lipid or cholesterol levels are high.  You are older than 67 years of age.  You are at high risk for heart disease. What should I know about cancer screening? Depending on your health history and family history, you may need to have cancer screening at various ages. This may include screening for:  Breast cancer.  Cervical cancer.  Colorectal cancer.  Skin cancer.  Lung cancer. What should I know about heart disease, diabetes, and high blood pressure? Blood pressure and heart disease  High blood pressure causes heart disease and increases the risk of stroke. This is more likely to develop in people who have high blood pressure readings, are of African descent, or are overweight.  Have your blood pressure checked: ? Every 3-5 years if you are 47-42 years of age. ? Every year if you are 109 years old or older. Diabetes Have regular diabetes screenings. This checks your fasting blood sugar level. Have the screening done:  Once every three years after age 68 if you are at a normal weight and have a low risk for diabetes.  More often and at a younger age if you are overweight or have a high risk for diabetes. What should I know about preventing infection? Hepatitis B If you have a higher risk for hepatitis B, you should be screened for this virus. Talk with your health care provider to  find out if you are at risk for hepatitis B infection. Hepatitis C Testing is recommended for:  Everyone born from [birth year] through 1965.  Anyone with known risk factors for hepatitis C. Sexually transmitted infections (STIs)  Get screened for STIs, including gonorrhea and chlamydia, if: ? You are sexually active and are younger than 67 years of age. ? You are older than 67 years of age and your health care provider tells you that you are at risk for this type of infection. ? Your sexual activity  has changed since you were last screened, and you are at increased risk for chlamydia or gonorrhea. Ask your health care provider if you are at risk.  Ask your health care provider about whether you are at high risk for HIV. Your health care provider may recommend a prescription medicine to help prevent HIV infection. If you choose to take medicine to prevent HIV, you should first get tested for HIV. You should then be tested every 3 months for as long as you are taking the medicine. Pregnancy  If you are about to stop having your period (premenopausal) and you may become pregnant, seek counseling before you get pregnant.  Take 400 to 800 micrograms (mcg) of folic acid every day if you become pregnant.  Ask for birth control (contraception) if you want to prevent pregnancy. Osteoporosis and menopause Osteoporosis is a disease in which the bones lose minerals and strength with aging. This can result in bone fractures. If you are 45 years old or older, or if you are at risk for osteoporosis and fractures, ask your health care provider if you should:  Be screened for bone loss.  Take a calcium or vitamin D supplement to lower your risk of fractures.  Be given hormone replacement therapy (HRT) to treat symptoms of menopause. Follow these instructions at home: Lifestyle  Do not use any products that contain nicotine or tobacco, such as cigarettes, e-cigarettes, and chewing tobacco. If you need help quitting, ask your health care provider.  Do not use street drugs.  Do not share needles.  Ask your health care provider for help if you need support or information about quitting drugs. Alcohol use  Do not drink alcohol if: ? Your health care provider tells you not to drink. ? You are pregnant, may be pregnant, or are planning to become pregnant.  If you drink alcohol: ? Limit how much you use to 0-1 drink a day. ? Limit intake if you are breastfeeding.  Be aware of how much alcohol is in  your drink. In the [place] one drink equals one 12 oz bottle of beer (355 mL), one 5 oz glass of wine (148 mL), or one 1 oz glass of hard liquor (44 mL). General instructions  Schedule regular health, dental, and eye exams.  Stay current with your vaccines.  Tell your health care provider if: ? You often feel depressed. ? You have ever been abused or do not feel safe at home. Summary  Adopting a healthy lifestyle and getting preventive care are important in promoting health and wellness.  Follow your health care provider's instructions about healthy diet, exercising, and getting tested or screened for diseases.  Follow your health care provider's instructions on monitoring your cholesterol and blood pressure. This information is not intended to replace advice given to you by your health care provider. Make sure you discuss any questions you have with your health care provider. Document Revised: 04/05/2018 Document Reviewed: 04/05/2018 Elsevier Patient Education  [place].

## 2019-07-16 NOTE — Progress Notes (Signed)
Subjective:   Amanda Morris is a 69 y.o. female who presents for Medicare Annual (Subsequent) preventive examination.  Review of Systems:  No ROS.  Medicare Wellness Virtual Visit.  Visual/audio telehealth visit, UTA vital signs.   See social history for additional risk factors.    Cardiac Risk Factors include: advanced age (>60mn, >>10women);diabetes mellitus Sleep patterns: Getting 8 hours of sleep a night.  Wakes up 3-4 times a night to void.   Wakes up and feels rested.   Home Safety/Smoke Alarms: Feels safe in home. Smoke alarms in place.  Living environment; Lives alone in an apartment 1st floor.  Shower step over tub combo and grab bars in place. Seat Belt Safety/Bike Helmet: Wears seat belt.   Female:   Pap-  Aged out     Mammo- UTD      Dexa scan- UTD       CCS- UTD     Objective:     Vitals: BP 127/73   Pulse 86   Ht _0  (1.626 m)   Wt 238 lb (108 kg)   SpO2 98%   BMI 40.85 kg/m   Body mass index is 40.85 kg/m.  Advanced Directives 07/16/2019 07/11/2018 12/02/2017 11/25/2017 11/21/2017 10/17/2017 03/17/2016  Does Patient Have a Medical Advance Directive? _1  Yes No  Type of Advance Directive - - - - - HBankerin Chart? - - - - - No - copy requested -  Would patient like information on creating a medical advance directive? No - Patient declined Yes (MAU/Ambulatory/Procedural Areas - Information given) No - Patient declined No - Patient declined No - Patient declined - -    Tobacco Social History   Tobacco Use  Smoking Status Former Smoker  . Types: Cigarettes  . Quit date: 07/30/2007  . Years since quitting: 11.9  Smokeless Tobacco Never Used     Counseling given: No   Clinical Intake:  Pre-visit preparation completed: Yes  Pain : No/denies pain Pain Score: 0-No pain     Nutritional Risks: None Diabetes: Yes CBG done?: No(FBS at home this morning 154) Did pt. bring  in CBG monitor from home?: No  How often do you need to have someone help you when you read instructions, pamphlets, or other written materials from your doctor or pharmacy?: 1 - Never What is the last grade level you completed in school?: 9th  Interpreter Needed?: No  Information entered by :: KOrlie Dakin LPN  Past Medical History:  Diagnosis Date  . Allergy   . [place]   . GERD (gastroesophageal reflux disease)   . Headache    migraines last one 2 weeks ago  . History of hiatal hernia   . Hypercholesterolemia   . Palpitations    in the past, no current issues per patient  . Postlaminectomy syndrome    Past Surgical History:  Procedure Laterality Date  . ABDOMINAL HYSTERECTOMY  1982  . BACK SURGERY    . BREAST REDUCTION SURGERY Bilateral 03/17/2016   Procedure: MAMMARY REDUCTION  (BREAST);  Surgeon: CWallace Going DO;  [facility]  Service: Plastics;  Laterality: Bilateral;  . SHOULDER SURGERY  06/2008, 09/2010   Right, by Dr. RKarie Soda then Dr. LJudeth Horn . SPINAL CORD STIMULATOR INSERTION N/A 12/02/2017   Procedure: LUMBAR SPINAL CORD STIMULATOR INSERTION;  Surgeon: CAshok Pall MD;  [facility]  Service: Neurosurgery;  Laterality: N/A;  . SPINAL CORD STIMULATOR TRIAL N/A 11/25/2017   Procedure: INSERTION LUMBAR SPINAL CORD STIMULATOR TRIAL  ;  Surgeon: Ashok Pall, MD;  Location: [facility];  Service: Neurosurgery;  Laterality: N/A;   INSERTION LUMBAR SPINAL CORD STIMULATOR TRIAL     Family History  Problem Relation Age of Onset  . Heart disease Mother   . Diabetes Mother   . Hypertension Mother   . Stroke Mother   . Heart disease Father   . Heart disease Sister   . Diabetes Sister   . Hyperlipidemia Sister   . Hypertension Sister   . Stroke Sister   . Alcohol abuse Brother   . Hyperlipidemia Brother    Social History   Socioeconomic History  . Marital status: Divorced    Spouse name: Not on  file  . Number of children: 1  . Years of education: 9th grade  . Highest education level: 9th grade  Occupational History  . Occupation: Disabled.     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 Smoking status: Former Smoker    Types: Cigarettes    Quit date: 07/30/2007    Years since quitting: 11.9  . Smokeless tobacco: Never Used  Substance and Sexual Activity  . Alcohol use: No  . Drug use: No  . Sexual activity: Never  Other Topics Concern  . Not on file  Social History Narrative   Caffeine intake. Some regular exercise. Works part time at the [employer] Strain:   . Difficulty of Paying Living Expenses:   Food Insecurity:   . Worried About Charity fundraiser in the Last Year:   . Arboriculturist in the Last Year:   Transportation Needs:   . Film/video editor (Medical):   Marland Kitchen Lack of Transportation (Non-Medical):   Physical Activity:   . Days of Exercise per Week:   . Minutes of Exercise per Session:   Stress:   . Feeling of Stress :   Social Connections:   . Frequency of Communication with Friends and Family:   . Frequency of Social Gatherings with Friends and Family:   . Attends Religious Services:   . Active Member of Clubs or Organizations:   . Attends Archivist Meetings:   Marland Kitchen Marital Status:     Outpatient Encounter Medications as of 07/16/2019  Medication Sig  . ACCU-CHEK AVIVA PLUS test strip FOR TESTING BLOOD SUGARS 3 TIMES DAILY. DX:E11.9  . Accu-Chek Softclix Lancets lancets USE AS DIRECTED TO TEST BLOOD SUGAR 3 TIMES A DAY  . aspirin EC 81 MG tablet Take 1 tablet (81 mg total) by mouth daily.  . B-D ULTRAFINE III SHORT PEN 31G X 8 MM MISC USE AS DIRECTED  . blood glucose meter kit and supplies KIT Dispense glucometer, test strips, and lancets. Use to check blood sugar twice daily. Dx: E11.9  . cholecalciferol (VITAMIN D) 1000 units tablet Take 1,000 Units by mouth daily.   Marland Kitchen DEXILANT 60 MG capsule Take 60 mg by mouth daily.   . diclofenac sodium (VOLTAREN) 1 % GEL Apply 4 g topically 4 (four) times daily. To affected joint.  Marland Kitchen dicyclomine (BENTYL) 10 MG capsule Take 10 mg by mouth 4 (four) times daily.   Marland Kitchen doxepin (SINEQUAN) 10 MG capsule Take 10 mg by mouth at bedtime.  . famotidine (PEPCID) 40 MG tablet Take 40 mg by mouth at  bedtime.  . fluticasone (FLONASE) 50 MCG/ACT nasal spray Place 1 spray into both nostrils daily.  Marland Kitchen gabapentin (NEURONTIN) 300 MG capsule TAKE 1 CAPSULE (300 MG TOTAL) BY MOUTH 3 (THREE) TIMES DAILY. MAX OF 3600MG/DAY  . glipiZIDE (GLUCOTROL) 10 MG tablet TAKE 1 TABLET BY MOUTH TWICE A DAY  . Insulin Glargine (LANTUS SOLOSTAR) 100 UNIT/ML Solostar Pen Inject 38 Units into the skin at bedtime.  Marland Kitchen JARDIANCE 25 MG TABS tablet TAKE 25 MG BY MOUTH DAILY.  Marland Kitchen levocetirizine (XYZAL) 5 MG tablet TAKE 1 TABLET BY MOUTH EVERY DAY IN THE EVENING  . Multiple Vitamins-Minerals (MULTIVITAMIN WOMEN 50+ PO) Take 1 capsule by mouth daily.  . naproxen sodium (ALEVE) 220 MG tablet Take 220 mg by mouth daily as needed.  . Omega-3 1000 MG CAPS Take 1 capsule by mouth daily.  Marland Kitchen perphenazine (TRILAFON) 8 MG tablet TAKE ONE TABLET (8 MG TOTAL) BY MOUTH AT BEDTIME.  . pravastatin (PRAVACHOL) 40 MG tablet TAKE 1 TABLET BY MOUTH EVERYDAY AT BEDTIME   No facility-administered encounter medications on file as of 07/16/2019.    Activities of Daily Living In your present state of health, do you have any difficulty performing the following activities: 07/16/2019  Hearing? N  Vision? N  Difficulty concentrating or making decisions? N  Walking or climbing stairs? Y  Comment stimulator in back  Dressing or bathing? N  Doing errands, shopping? N  Preparing Food and eating ? N  Using the Toilet? N  In the past six months, have you accidently leaked urine? N  Comment only when sneezes or coughs  Do you have problems with loss of bowel control? N  Managing your  Medications? N  Managing your Finances? N  Housekeeping or managing your Housekeeping? N  Some recent data might be hidden    Patient Care Team: Hali Marry, MD as PCP - General (Family Medicine) Eulis Canner (Gastroenterology)    Assessment:   This is a routine wellness examination for Amanda Morris.Physical assessment deferred to PCP.  Patient states is having some vaginal itching and some burning with urination.  UA performed- patient is spilling sugar in urine and explained she probably has a yeast infection and would let MD know to see if could call in a diflucan for her.     Exercise Activities and Dietary recommendations Current Exercise Habits: Structured exercise class, Type of exercise: walking;strength training/weights;stretching(cycling, leg exercises at gym), Time (Minutes): 60, Frequency (Times/Week): 4, Weekly Exercise (Minutes/Week): 240, Intensity: Moderate Diet Eats tv dinners and fruit cups, potatoes. Instructed on some foods to stay away from and ffods that would be healthy for her with diabetes. Breakfast: Berniece Salines and eggs and potatoes Lunch: Crackers or smoothie replacement Dinner: Meat and vegetables    FBS at home:140, 104, 127, 141, 134, 83, 124, 95, 79, 132, 120, 77, 129, 87, 154  Goals    . Weight (lb) < 200 lb (90.7 kg)     Wants to lose 30 - 50 lbs in the year 2020       Fall Risk Fall Risk  07/16/2019 05/07/2019 07/11/2018 07/19/2017 01/18/2017  Falls in the past year? 1 0 0 No Yes  Number falls in past yr: 0 0 - - 1  Injury with Fall? 0 0 - - No  Risk for fall due to : No Fall Risks - - - Impaired balance/gait  Risk for fall due to: Comment - - - - -  Follow up Falls prevention discussed - - -  Falls prevention discussed   Is the patient's home free of loose throw rugs in walkways, pet beds, electrical cords, etc?   yes      Grab bars in the bathroom? yes      Handrails on the stairs?   no      Adequate lighting?   yes   Depression Screen PHQ  2/9 Scores 07/16/2019 05/07/2019 02/05/2019 02/05/2019  PHQ - 2 Score 0 0 2 0  PHQ- 9 Score - - 8 0     Cognitive Function     6CIT Screen 07/16/2019 07/11/2018 01/18/2017  What Year? 0 points 0 points 0 points  What month? 0 points 0 points 0 points  What time? 0 points 0 points 0 points  Count back from 20 0 points 0 points 2 points  Months in reverse 2 points 0 points 0 points  Repeat phrase 0 points 10 points 6 points  Total Score _0 Immunization History  Administered Date(s) Administered  . Influenza Split 03/02/2011, 02/25/2012  . Influenza, High Dose Seasonal PF 06/06/2017, 12/25/2017, 12/19/2018  . Influenza,inj,Quad PF,6+ Mos 02/13/2015, 12/23/2015, 12/22/2016  . Influenza-Unspecified 01/24/2013, 01/21/2014, 02/26/2015  . Moderna SARS-COVID-2 Vaccination 06/08/2019, 07/06/2019  . PPD Test 02/16/2017  . Pneumococcal Conjugate-13 02/14/2015  . Pneumococcal Polysaccharide-23 05/12/2005, 01/17/2018  . Td 07/07/2019  . Tdap 04/27/2007, 07/30/2017  . Zoster Recombinat (Shingrix) 04/01/2017, 08/10/2017    Screening Tests Health Maintenance  Topic Date Due  . FOOT EXAM  07/11/2019  . URINE MICROALBUMIN  07/11/2019  . OPHTHALMOLOGY EXAM  11/02/2019  . HEMOGLOBIN A1C  11/04/2019  . MAMMOGRAM  01/03/2021  . COLONOSCOPY  10/29/2021  . TETANUS/TDAP  07/06/2029  . INFLUENZA VACCINE  Completed  . DEXA SCAN  Completed  . Hepatitis C Screening  Completed  . PNA vac Low Risk Adult  Completed      Plan:  Please schedule your next medicare wellness visit with me in 1 yr.  Amanda Morris , Thank you for taking time to come for your Medicare Wellness Visit. I appreciate your ongoing commitment to your health goals. Please review the following plan we discussed and let me know if I can assist you in the future.  Continue doing brain stimulating activities (puzzles, reading, adult coloring books, staying active) to keep memory sharp.    These are the goals we  discussed: Goals    . Weight (lb) < 200 lb (90.7 kg)     Wants to lose 30 - 50 lbs in the year 2020       This is a list of the screening recommended for you and due dates:  Health Maintenance  Topic Date Due  . Complete foot exam   07/11/2019  . Urine Protein Check  07/11/2019  . Eye exam for diabetics  11/02/2019  . Hemoglobin A1C  11/04/2019  . Mammogram  01/03/2021  . Colon Cancer Screening  10/29/2021  . Tetanus Vaccine  07/06/2029  . Flu Shot  Completed  . DEXA scan (bone density measurement)  Completed  .  Hepatitis C: One time screening is recommended by Center for Disease Control  (CDC) for  adults born from [birth year] through 1963.   Completed  . Pneumonia vaccines  Completed     I have personally reviewed and noted the following in the patient's chart:   . Medical and social history . Use of alcohol, tobacco or illicit drugs  . Current medications and supplements . Functional ability and  status . Nutritional status . Physical activity . Advanced directives . List of other physicians . Hospitalizations, surgeries, and ER visits in previous 12 months . Vitals . Screenings to include cognitive, depression, and falls . Referrals and appointments  In addition, I have reviewed and discussed with patient certain preventive protocols, quality metrics, and best practice recommendations. A written personalized care plan for preventive services as well as general preventive health recommendations were provided to patient.     Joanne Chars, LPN  11/05/4578

## 2019-07-16 NOTE — Progress Notes (Signed)
Vaginitis symptoms most consistent with yeast.  Will wait and send in prescription for Diflucan.  If not proving over the next 3 days then please give [place] a call back.  Beatrice Lecher, MD

## 2019-07-16 NOTE — Addendum Note (Signed)
Addended by: Beatrice Lecher D on: 07/16/2019 08:56 AM   Modules accepted: Orders

## 2019-08-06 ENCOUNTER — Ambulatory Visit (INDEPENDENT_AMBULATORY_CARE_PROVIDER_SITE_OTHER): Payer: Medicare Other | Admitting: Family Medicine

## 2019-08-06 ENCOUNTER — Encounter: Payer: Self-pay | Admitting: Family Medicine

## 2019-08-06 ENCOUNTER — Other Ambulatory Visit: Payer: Self-pay

## 2019-08-06 VITALS — BP 125/71 | HR 85 | Ht 61.0 in | Wt 232.0 lb

## 2019-08-06 DIAGNOSIS — E119 Type 2 diabetes mellitus without complications: Secondary | ICD-10-CM

## 2019-08-06 DIAGNOSIS — M21969 Unspecified acquired deformity of unspecified lower leg: Secondary | ICD-10-CM

## 2019-08-06 DIAGNOSIS — L84 Corns and callosities: Secondary | ICD-10-CM

## 2019-08-06 DIAGNOSIS — E1169 Type 2 diabetes mellitus with other specified complication: Secondary | ICD-10-CM | POA: Diagnosis not present

## 2019-08-06 DIAGNOSIS — Z6841 Body Mass Index (BMI) 40.0 and over, adult: Secondary | ICD-10-CM

## 2019-08-06 DIAGNOSIS — I10 Essential (primary) hypertension: Secondary | ICD-10-CM

## 2019-08-06 LAB — POCT GLYCOSYLATED HEMOGLOBIN (HGB A1C): Hemoglobin A1C: 6.9 % — AB (ref 4.0–5.6)

## 2019-08-06 LAB — BASIC METABOLIC PANEL WITH GFR
BUN: 13 mg/dL (ref 7–25)
CO2: 26 mmol/L (ref 20–32)
Calcium: 9 mg/dL (ref 8.6–10.4)
Chloride: 106 mmol/L (ref 98–110)
Creat: 0.77 mg/dL (ref 0.50–0.99)
GFR, Est African American: 93 mL/min/{1.73_m2} (ref >=60)
GFR, Est Non African American: 80 mL/min/{1.73_m2} (ref >=60)
Glucose, Bld: 122 mg/dL — ABNORMAL HIGH (ref 65–99)
Potassium: 4 mmol/L (ref 3.5–5.3)
Sodium: 142 mmol/L (ref 135–146)

## 2019-08-06 MED ORDER — PRAVASTATIN SODIUM 40 MG PO TABS: ORAL_TABLET | ORAL | 3 refills | Status: DC

## 2019-08-06 MED ORDER — LANTUS SOLOSTAR 100 UNIT/ML ~~LOC~~ SOPN: 38.0000 [IU] | PEN_INJECTOR | Freq: Every day | SUBCUTANEOUS | 5 refills | Status: DC

## 2019-08-06 MED ORDER — GABAPENTIN 300 MG PO CAPS: 300.0000 mg | ORAL_CAPSULE | Freq: Three times a day (TID) | ORAL | 2 refills | Status: DC

## 2019-08-06 NOTE — Progress Notes (Addendum)
Established Patient Office Visit  Subjective:  Patient ID: Amanda Morris, female    DOB: 1951/01/26  Age: 69 y.o. MRN: [id number]  CC:  Chief Complaint  Patient presents with  . Diabetes  . Hypertension    HPI Amanda Morris presents for   Diabetes - no hypoglycemic events. No wounds or sores that are not healing well. No increased thirst or urination. Checking glucose at home. Taking medications as prescribed without any side effects.  Hypertension- Pt denies chest pain, SOB, dizziness, or heart palpitations.  Taking meds as directed w/o problems.  Denies medication side effects.    bmi 43 - she is down 6 lbs. She is working with the Bariatric clinic and she is taking a supplement called Go-LO.  Into the gym a few times but admits that she is not really been exercising consistently.   Past Medical History:  Diagnosis Date  . Allergy   . Diabetes mellitus without complication ([place])   . GERD (gastroesophageal reflux disease)   . Headache    migraines last one 2 weeks ago  . History of hiatal hernia   . Hypercholesterolemia   . Palpitations    in the past, no current issues per patient  . Postlaminectomy syndrome     Past Surgical History:  Procedure Laterality Date  . ABDOMINAL HYSTERECTOMY  1982  . BACK SURGERY    . BREAST REDUCTION SURGERY Bilateral 03/17/2016   Procedure: MAMMARY REDUCTION  (BREAST);  Surgeon: Wallace Going, DO;  Location: [facility];  Service: Plastics;  Laterality: Bilateral;  . SHOULDER SURGERY  06/2008, 09/2010   Right, by Dr. Karie Soda, then Dr. Judeth Horn  . SPINAL CORD STIMULATOR INSERTION N/A 12/02/2017   Procedure: LUMBAR SPINAL CORD STIMULATOR INSERTION;  Surgeon: Ashok Pall, MD;  Location: [facility];  Service: Neurosurgery;  Laterality: N/A;  . SPINAL CORD STIMULATOR TRIAL N/A 11/25/2017   Procedure: INSERTION LUMBAR SPINAL CORD STIMULATOR TRIAL  ;  Surgeon: Ashok Pall, MD;  Location: [facility];  Service:  Neurosurgery;  Laterality: N/A;   INSERTION LUMBAR SPINAL CORD STIMULATOR TRIAL      Family History  Problem Relation Age of Onset  . Heart disease Mother   . Diabetes Mother   . Hypertension Mother   . Stroke Mother   . Heart disease Father   . Heart disease Sister   . Diabetes Sister   . Hyperlipidemia Sister   . Hypertension Sister   . Stroke Sister   . Alcohol abuse Brother   . Hyperlipidemia Brother     Social History   Socioeconomic History  . Marital status: Divorced    Spouse name: Not on file  . Number of children: 1  . Years of education: 9th grade  . Highest education level: 9th grade  Occupational History  . Occupation: Disabled.     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 Smoking status: Former Smoker    Types: Cigarettes    Quit date: 07/30/2007    Years since quitting: 12.2  . Smokeless tobacco: Never Used  Vaping Use  . Vaping Use: Never used  Substance and Sexual Activity  . Alcohol use: No  . Drug use: No  . Sexual activity: Never  Other Topics Concern  . Not on file  Social History Narrative   Caffeine intake. Some regular exercise. Works part time at the [employer] Strain:   . Difficulty  of Paying Living Expenses:   Food Insecurity:   . Worried About Charity fundraiser in the Last Year:   . Arboriculturist in the Last Year:   Transportation Needs:   . Film/video editor (Medical):   Marland Kitchen Lack of Transportation (Non-Medical):   Physical Activity:   . Days of Exercise per Week:   . Minutes of Exercise per Session:   Stress:   . Feeling of Stress :   Social Connections:   . Frequency of Communication with Friends and Family:   . Frequency of Social Gatherings with Friends and Family:   . Attends Religious Services:   . Active Member of Clubs or Organizations:   . Attends Archivist Meetings:   Marland Kitchen Marital Status:   Intimate Partner Violence:   . Fear  of Current or Ex-Partner:   . Emotionally Abused:   Marland Kitchen Physically Abused:   . Sexually Abused:     Outpatient Medications Prior to Visit  Medication Sig Dispense Refill  . ACCU-CHEK AVIVA PLUS test strip FOR TESTING BLOOD SUGARS 3 TIMES DAILY. DX:E11.9 300 strip 12  . Accu-Chek Softclix Lancets lancets USE AS DIRECTED TO TEST BLOOD SUGAR 3 TIMES A DAY 100 each prn  . aspirin EC 81 MG tablet Take 1 tablet (81 mg total) by mouth daily.    . B-D ULTRAFINE III SHORT PEN 31G X 8 MM MISC USE AS DIRECTED 100 each 6  . blood glucose meter kit and supplies KIT Dispense glucometer, test strips, and lancets. Use to check blood sugar twice daily. Dx: E11.9 1 each 0  . cholecalciferol (VITAMIN D) 1000 units tablet Take 1,000 Units by mouth daily.    Marland Kitchen DEXILANT 60 MG capsule Take 60 mg by mouth daily.   3  . diclofenac sodium (VOLTAREN) 1 % GEL Apply 4 g topically 4 (four) times daily. To affected joint. 100 g 11  . doxepin (SINEQUAN) 10 MG capsule Take 10 mg by mouth at bedtime.    . fluticasone (FLONASE) 50 MCG/ACT nasal spray Place 1 spray into both nostrils daily. 48 mL 4  . glipiZIDE (GLUCOTROL) 10 MG tablet TAKE 1 TABLET BY MOUTH TWICE A DAY 180 tablet 1  . Multiple Vitamins-Minerals (MULTIVITAMIN WOMEN 50+ PO) Take 1 capsule by mouth daily.    . naproxen sodium (ALEVE) 220 MG tablet Take 220 mg by mouth daily as needed.    . Omega-3 1000 MG CAPS Take 1 capsule by mouth daily.    Marland Kitchen perphenazine (TRILAFON) 8 MG tablet TAKE ONE TABLET (8 MG TOTAL) BY MOUTH AT BEDTIME.  2  . dicyclomine (BENTYL) 10 MG capsule Take 10 mg by mouth 4 (four) times daily.     . famotidine (PEPCID) 40 MG tablet Take 40 mg by mouth at bedtime.    . fluconazole (DIFLUCAN) 150 MG tablet Take 1 tablet (150 mg total) by mouth daily. 1 tablet 1  . gabapentin (NEURONTIN) 300 MG capsule TAKE 1 CAPSULE (300 MG TOTAL) BY MOUTH 3 (THREE) TIMES DAILY. MAX OF 3600MG/DAY 540 capsule 2  . Insulin Glargine (LANTUS SOLOSTAR) 100 UNIT/ML  Solostar Pen Inject 38 Units into the skin at bedtime.    Marland Kitchen JARDIANCE 25 MG TABS tablet TAKE 25 MG BY MOUTH DAILY. 90 tablet 1  . levocetirizine (XYZAL) 5 MG tablet TAKE 1 TABLET BY MOUTH EVERY DAY IN THE EVENING 90 tablet 3  . pravastatin (PRAVACHOL) 40 MG tablet TAKE 1 TABLET BY MOUTH EVERYDAY AT BEDTIME  90 tablet 3   No facility-administered medications prior to visit.    Allergies  Allergen Reactions  . Penicillins Hives and Other (See Comments)    PATIENT HAS HAD A PCN REACTION WITH IMMEDIATE RASH, FACIAL/TONGUE/THROAT SWELLING, SOB, OR LIGHTHEADEDNESS WITH HYPOTENSION:  #  #  YES  #  #  Has patient had a PCN reaction causing severe rash involving mucus membranes or skin necrosis: No Has patient had a PCN reaction that required hospitalization: No Has patient had a PCN reaction occurring within the last 10 years: No If all of the above answers are "NO", then may proceed with Cephalosporin use.   . Aspirin Nausea Only  . Codeine Itching  . Hydrocodone Nausea Only  . Metformin And Related Diarrhea and Nausea Only  . Oxycodone Nausea And Vomiting    Other reaction(s): GI Upset (intolerance)  . Victoza [Liraglutide] Other (See Comments)    Abdominal pain    ROS Review of Systems    Objective:    Physical Exam  Constitutional: She is oriented to person, place, and time. She appears well-developed and well-nourished.  HENT:  Head: Normocephalic and atraumatic.  Cardiovascular: Normal rate, regular rhythm and normal heart sounds.  Pulmonary/Chest: Effort normal and breath sounds normal.  Neurological: She is alert and oriented to person, place, and time.  Skin: Skin is warm and dry.  Psychiatric: She has a normal mood and affect. Her behavior is normal.    BP 125/71   Pulse 85   Ht 5' 1" (1.549 m)   Wt 232 lb (105.2 kg)   BMI 43.84 kg/m  Wt Readings from Last 3 Encounters:  08/06/19 232 lb (105.2 kg)  07/16/19 238 lb (108 kg)  05/07/19 238 lb (108 kg)     Health  Maintenance Due  Topic Date Due  . URINE MICROALBUMIN  07/11/2019    There are no preventive care reminders to display for this patient.  Lab Results  Component Value Date   TSH 1.45 01/22/2015   Lab Results  Component Value Date   WBC 9.7 11/21/2017   HGB 12.8 11/21/2017   HCT 42.9 11/21/2017   MCV 80.9 11/21/2017   PLT 273 11/21/2017   Lab Results  Component Value Date   NA 142 08/06/2019   K 4.0 08/06/2019   CO2 26 08/06/2019   GLUCOSE 122 (H) 08/06/2019   BUN 13 08/06/2019   CREATININE 0.77 08/06/2019   BILITOT 0.3 02/05/2019   ALKPHOS 102 09/21/2016   AST 19 02/05/2019   ALT 15 02/05/2019   PROT 7.0 02/05/2019   ALBUMIN 3.8 09/21/2016   CALCIUM 9.0 08/06/2019   ANIONGAP 12 11/21/2017   Lab Results  Component Value Date   CHOL 151 02/05/2019   Lab Results  Component Value Date   HDL 37 (L) 02/05/2019   Lab Results  Component Value Date   LDLCALC 90 02/05/2019   Lab Results  Component Value Date   TRIG 139 02/05/2019   Lab Results  Component Value Date   CHOLHDL 4.1 02/05/2019   Lab Results  Component Value Date   HGBA1C 6.9 (A) 08/06/2019      Assessment & Plan:   Problem List Items Addressed This Visit      Cardiovascular and Mediastinum   Essential hypertension    Well controlled. Continue current regimen. Follow up in  3 mo      Relevant Medications   pravastatin (PRAVACHOL) 40 MG tablet   Other Relevant Orders   BASIC  METABOLIC PANEL WITH GFR (Completed)     Endocrine   Type 2 diabetes mellitus with other specified complication (HCC) - Primary    A1C up to 6.9.  She is really done a great job and has already lost 6 pounds since she was here 3 months ago.  She plans on continuing to work on this so we will just continue with her current medication regimen and not make any changes we did discuss trying to be more consistent with exercises that would help lower her blood glucose as well.      Relevant Medications   pravastatin  (PRAVACHOL) 40 MG tablet   insulin glargine (LANTUS SOLOSTAR) 100 UNIT/ML Solostar Pen   Other Relevant Orders   POCT glycosylated hemoglobin (Hb A1C) (Completed)     Other   BMI 40.0-44.9, adult (HCC)   Relevant Medications   insulin glargine (LANTUS SOLOSTAR) 100 UNIT/ML Solostar Pen    Other Visit Diagnoses    Diabetes mellitus without complication (HCC)       Relevant Medications   pravastatin (PRAVACHOL) 40 MG tablet   insulin glargine (LANTUS SOLOSTAR) 100 UNIT/ML Solostar Pen   Other Relevant Orders   BASIC METABOLIC PANEL WITH GFR (Completed)   Callus of heel       Relevant Orders   Ambulatory referral to Podiatry   Pre-ulcerative calluses       Deformity of foot, unspecified laterality         Pre-ulcerative callus of left heel.  She also has 2 calluses near her great toe on the same foot.  She says she has tried shaving the area back but it always seems to want to come back she has been wearing some extra cushioning in her shoes and she normally wears sneakers.  We discussed options.  I did go ahead and remove some of the dead skin and will refer her to podiatry for more definitive care.  We discussed that it usually reaction to friction within the shoe she might benefit from some custom orthotics.  We also discussed the importance of moisturizing the area particularly at night and maybe even getting something like a pumice stone to debride some the start dead skin daily while in the shower.  BMI 43-congratulated her she is down 6 pounds which is absolutely fantastic.  Overly she will continue to lose she would like to get down to 165 pounds.  Meds ordered this encounter  Medications  . pravastatin (PRAVACHOL) 40 MG tablet    Sig: TAKE 1 TABLET BY MOUTH EVERYDAY AT BEDTIME    Dispense:  90 tablet    Refill:  3    DX Code Needed  .  . insulin glargine (LANTUS SOLOSTAR) 100 UNIT/ML Solostar Pen    Sig: Inject 38 Units into the skin at bedtime.    Dispense:  15 mL     Refill:  5  . DISCONTD: gabapentin (NEURONTIN) 300 MG capsule    Sig: Take 1 capsule (300 mg total) by mouth 3 (three) times daily. MAX OF 3600MG/DAY    Dispense:  540 capsule    Refill:  2    Follow-up: Return for Diabetes follow-up.    Beatrice Lecher, MD

## 2019-08-06 NOTE — Assessment & Plan Note (Signed)
Well controlled. Continue current regimen. Follow up in  3 mo .  

## 2019-08-06 NOTE — Assessment & Plan Note (Signed)
A1C up to 6.9.  She is really done a great job and has already lost 6 pounds since she was here 3 months ago.  She plans on continuing to work on this so we will just continue with her current medication regimen and not make any changes we did discuss trying to be more consistent with exercises that would help lower her blood glucose as well.

## 2019-08-07 NOTE — Progress Notes (Signed)
All labs are normal. 

## 2019-08-08 DIAGNOSIS — E119 Type 2 diabetes mellitus without complications: Secondary | ICD-10-CM | POA: Diagnosis not present

## 2019-08-08 DIAGNOSIS — Z713 Dietary counseling and surveillance: Secondary | ICD-10-CM | POA: Diagnosis not present

## 2019-08-17 ENCOUNTER — Other Ambulatory Visit: Payer: Self-pay

## 2019-08-17 ENCOUNTER — Ambulatory Visit (INDEPENDENT_AMBULATORY_CARE_PROVIDER_SITE_OTHER): Payer: Medicare Other | Admitting: Podiatry

## 2019-08-17 ENCOUNTER — Encounter: Payer: Self-pay | Admitting: Podiatry

## 2019-08-17 VITALS — BP 117/75 | HR 90 | Temp 97.2°F | Resp 16

## 2019-08-17 DIAGNOSIS — M21619 Bunion of unspecified foot: Secondary | ICD-10-CM | POA: Diagnosis not present

## 2019-08-17 DIAGNOSIS — E119 Type 2 diabetes mellitus without complications: Secondary | ICD-10-CM

## 2019-08-17 DIAGNOSIS — L84 Corns and callosities: Secondary | ICD-10-CM

## 2019-08-17 DIAGNOSIS — E1169 Type 2 diabetes mellitus with other specified complication: Secondary | ICD-10-CM | POA: Diagnosis not present

## 2019-08-17 NOTE — Progress Notes (Signed)
Subjective:    Patient ID: Amanda Morris, female    DOB: 1950/10/01, 69 y.o.   MRN: [id number]  HPI 69 year old female presents the office today for concerns of calluses of the bottoms of both of her heels as well as medial aspects of both big toes.  Said that she has pain in the callus on the left foot.  She denies any open sores or denies any redness or drainage.  She is diabetic and last A1c was 6.9.  She does say her morning blood sugar today was 115.  Denies any claudication symptoms.  She is currently on gabapentin.   Review of Systems  All other systems reviewed and are negative.  Past Medical History:  Diagnosis Date  . Allergy   . Diabetes mellitus without complication ([place])   . GERD (gastroesophageal reflux disease)   . Headache    migraines last one 2 weeks ago  . History of hiatal hernia   . Hypercholesterolemia   . Palpitations    in the past, no current issues per patient  . Postlaminectomy syndrome     Past Surgical History:  Procedure Laterality Date  . ABDOMINAL HYSTERECTOMY  1982  . BACK SURGERY    . BREAST REDUCTION SURGERY Bilateral 03/17/2016   Procedure: MAMMARY REDUCTION  (BREAST);  Surgeon: Wallace Going, DO;  Location: [facility];  Service: Plastics;  Laterality: Bilateral;  . SHOULDER SURGERY  06/2008, 09/2010   Right, by Dr. Karie Soda, then Dr. Judeth Horn  . SPINAL CORD STIMULATOR INSERTION N/A 12/02/2017   Procedure: LUMBAR SPINAL CORD STIMULATOR INSERTION;  Surgeon: Ashok Pall, MD;  Location: [facility];  Service: Neurosurgery;  Laterality: N/A;  . SPINAL CORD STIMULATOR TRIAL N/A 11/25/2017   Procedure: INSERTION LUMBAR SPINAL CORD STIMULATOR TRIAL  ;  Surgeon: Ashok Pall, MD;  Location: [facility];  Service: Neurosurgery;  Laterality: N/A;   INSERTION LUMBAR SPINAL CORD STIMULATOR TRIAL       Current Outpatient Medications:  .  ACCU-CHEK AVIVA PLUS test strip, FOR TESTING BLOOD SUGARS 3 TIMES DAILY. DX:E11.9, Disp: 300  strip, Rfl: 12 .  Accu-Chek Softclix Lancets lancets, USE AS DIRECTED TO TEST BLOOD SUGAR 3 TIMES A DAY, Disp: 100 each, Rfl: prn .  aspirin EC 81 MG tablet, Take 1 tablet (81 mg total) by mouth daily., Disp: , Rfl:  .  B-D ULTRAFINE III SHORT PEN 31G X 8 MM MISC, USE AS DIRECTED, Disp: 100 each, Rfl: 6 .  blood glucose meter kit and supplies KIT, Dispense glucometer, test strips, and lancets. Use to check blood sugar twice daily. Dx: E11.9, Disp: 1 each, Rfl: 0 .  cholecalciferol (VITAMIN D) 1000 units tablet, Take 1,000 Units by mouth daily., Disp: , Rfl:  .  DEXILANT 60 MG capsule, Take 60 mg by mouth daily. , Disp: , Rfl: 3 .  diclofenac sodium (VOLTAREN) 1 % GEL, Apply 4 g topically 4 (four) times daily. To affected joint., Disp: 100 g, Rfl: 11 .  dicyclomine (BENTYL) 20 MG tablet, Take 20 mg by mouth 4 (four) times daily., Disp: , Rfl:  .  doxepin (SINEQUAN) 10 MG capsule, Take 10 mg by mouth at bedtime., Disp: , Rfl:  .  famotidine (PEPCID) 20 MG tablet, , Disp: , Rfl:  .  fluticasone (FLONASE) 50 MCG/ACT nasal spray, Place 1 spray into both nostrils daily., Disp: 48 mL, Rfl: 4 .  gabapentin (NEURONTIN) 300 MG capsule, Take 1 capsule (300 mg total) by mouth 3 (three) times  daily. MAX OF 3600MG/DAY, Disp: 540 capsule, Rfl: 2 .  glipiZIDE (GLUCOTROL) 10 MG tablet, TAKE 1 TABLET BY MOUTH TWICE A DAY, Disp: 180 tablet, Rfl: 1 .  insulin glargine (LANTUS SOLOSTAR) 100 UNIT/ML Solostar Pen, Inject 38 Units into the skin at bedtime., Disp: 15 mL, Rfl: 5 .  JARDIANCE 25 MG TABS tablet, TAKE 25 MG BY MOUTH DAILY., Disp: 90 tablet, Rfl: 1 .  levocetirizine (XYZAL) 5 MG tablet, TAKE 1 TABLET BY MOUTH EVERY DAY IN THE EVENING, Disp: 90 tablet, Rfl: 3 .  Multiple Vitamins-Minerals (MULTIVITAMIN WOMEN 50+ PO), Take 1 capsule by mouth daily., Disp: , Rfl:  .  naproxen sodium (ALEVE) 220 MG tablet, Take 220 mg by mouth daily as needed., Disp: , Rfl:  .  Omega-3 1000 MG CAPS, Take 1 capsule by mouth daily.,  Disp: , Rfl:  .  perphenazine (TRILAFON) 8 MG tablet, TAKE ONE TABLET (8 MG TOTAL) BY MOUTH AT BEDTIME., Disp: , Rfl: 2 .  pravastatin (PRAVACHOL) 40 MG tablet, TAKE 1 TABLET BY MOUTH EVERYDAY AT BEDTIME, Disp: 90 tablet, Rfl: 3  Allergies  Allergen Reactions  . Penicillins Hives and Other (See Comments)    PATIENT HAS HAD A PCN REACTION WITH IMMEDIATE RASH, FACIAL/TONGUE/THROAT SWELLING, SOB, OR LIGHTHEADEDNESS WITH HYPOTENSION:  #  #  YES  #  #  Has patient had a PCN reaction causing severe rash involving mucus membranes or skin necrosis: No Has patient had a PCN reaction that required hospitalization: No Has patient had a PCN reaction occurring within the last 10 years: No If all of the above answers are "NO", then may proceed with Cephalosporin use.   . Aspirin Nausea Only  . Codeine Itching  . Hydrocodone Nausea Only  . Metformin And Related Diarrhea and Nausea Only  . Oxycodone Nausea And Vomiting    Other reaction(s): GI Upset (intolerance)  . Victoza [Liraglutide] Other (See Comments)    Abdominal pain       Objective:   Physical Exam  General: AAO x3, NAD  Dermatological: Hyperkeratotic lesions bilateral plantar heels, medial hallux without any underlying ulceration drainage or signs of infection.  No open lesions.  Vascular: Dorsalis Pedis artery and Posterior Tibial artery pedal pulses are 2/4 bilateral with immedate capillary fill time. There is no pain with calf compression, swelling, warmth, erythema.   Neruologic: Grossly intact via light touch bilateral.  Sensation appears to be intact with Semmes Weinstein monofilament.  Occasionally some tingling, numbness to her feet.  No weakness or falls.  Musculoskeletal: Bunion deformities present bilaterally.  Muscular strength 5/5 in all groups tested bilateral.  Gait: Unassisted, Nonantalgic.        Assessment & Plan:  69 year old female type 2 diabetes with preulcerative calluses -Treatment options discussed  including all alternatives, risks, and complications -Etiology of symptoms were discussed -Debrided the hyperkeratotic lesions x4 without any complications or bleeding.  Recommend moisturizer daily.  Discussed offloading.  Will check insurance for diabetic insert.  Return in about 3 months (around 11/16/2019).  Trula Slade DPM

## 2019-08-17 NOTE — Patient Instructions (Signed)
Diabetes Mellitus and Foot Care Foot care is an important part of your health, especially when you have diabetes. Diabetes may cause you to have problems because of poor blood flow (circulation) to your feet and legs, which can cause your skin to:  Become thinner and drier.  Break more easily.  Heal more slowly.  Peel and crack. You may also have nerve damage (neuropathy) in your legs and feet, causing decreased feeling in them. This means that you may not notice minor injuries to your feet that could lead to more serious problems. Noticing and addressing any potential problems early is the best way to prevent future foot problems. How to care for your feet Foot hygiene  Wash your feet daily with warm water and mild soap. Do not use hot water. Then, pat your feet and the areas between your toes until they are completely dry. Do not soak your feet as this can dry your skin.  Trim your toenails straight across. Do not dig under them or around the cuticle. File the edges of your nails with an emery board or nail file.  Apply a moisturizing lotion or petroleum jelly to the skin on your feet and to dry, brittle toenails. Use lotion that does not contain alcohol and is unscented. Do not apply lotion between your toes. Shoes and socks  Wear clean socks or stockings every day. Make sure they are not too tight. Do not wear knee-high stockings since they may decrease blood flow to your legs.  Wear shoes that fit properly and have enough cushioning. Always look in your shoes before you put them on to be sure there are no objects inside.  To break in new shoes, wear them for just a few hours a day. This prevents injuries on your feet. Wounds, scrapes, corns, and calluses  Check your feet daily for blisters, cuts, bruises, sores, and redness. If you cannot see the bottom of your feet, use a mirror or ask someone for help.  Do not cut corns or calluses or try to remove them with medicine.  If you  find a minor scrape, cut, or break in the skin on your feet, keep it and the skin around it clean and dry. You may clean these areas with mild soap and water. Do not clean the area with peroxide, alcohol, or iodine.  If you have a wound, scrape, corn, or callus on your foot, look at it several times a day to make sure it is healing and not infected. Check for: ? Redness, swelling, or pain. ? Fluid or blood. ? Warmth. ? Pus or a bad smell. General instructions  Do not cross your legs. This may decrease blood flow to your feet.  Do not use heating pads or hot water bottles on your feet. They may burn your skin. If you have lost feeling in your feet or legs, you may not know this is happening until it is too late.  Protect your feet from hot and cold by wearing shoes, such as at the beach or on hot pavement.  Schedule a complete foot exam at least once a year (annually) or more often if you have foot problems. If you have foot problems, report any cuts, sores, or bruises to your health care provider immediately. Contact a health care provider if:  You have a medical condition that increases your risk of infection and you have any cuts, sores, or bruises on your feet.  You have an injury that is not   healing.  You have redness on your legs or feet.  You feel burning or tingling in your legs or feet.  You have pain or cramps in your legs and feet.  Your legs or feet are numb.  Your feet always feel cold.  You have pain around a toenail. Get help right away if:  You have a wound, scrape, corn, or callus on your foot and: ? You have pain, swelling, or redness that gets worse. ? You have fluid or blood coming from the wound, scrape, corn, or callus. ? Your wound, scrape, corn, or callus feels warm to the touch. ? You have pus or a bad smell coming from the wound, scrape, corn, or callus. ? You have a fever. ? You have a red line going up your leg. Summary  Check your feet every day  for cuts, sores, red spots, swelling, and blisters.  Moisturize feet and legs daily.  Wear shoes that fit properly and have enough cushioning.  If you have foot problems, report any cuts, sores, or bruises to your health care provider immediately.  Schedule a complete foot exam at least once a year (annually) or more often if you have foot problems. This information is not intended to replace advice given to you by your health care provider. Make sure you discuss any questions you have with your health care provider. Document Revised: 01/03/2019 Document Reviewed: 05/14/2016 Elsevier Patient Education  2020 [employer]  

## 2019-08-22 ENCOUNTER — Telehealth: Payer: Self-pay | Admitting: Podiatry

## 2019-08-22 NOTE — Telephone Encounter (Signed)
Pt is aware of the cost of the diabetic inserts but did not want to schedule an appt now. Will discuss at appt in 3 months with Dr Jacqualyn Posey.

## 2019-08-22 NOTE — Telephone Encounter (Signed)
Called pt to discuss diabetic inserts estimated cost and no voicemail set up.

## 2019-08-28 ENCOUNTER — Telehealth: Payer: Self-pay

## 2019-08-28 NOTE — Telephone Encounter (Signed)
[facility] called and left a message for a referral to Dr Georgina Snell. I called patient back, unable to leave a message, voicemail has not been set up.

## 2019-08-29 NOTE — Telephone Encounter (Signed)
Amanda Morris is wanting to see Dr Georgina Snell for leg pain. She states she has seen him for this in the past. I gave Amanda Morris Dr Corey's phone number. I advised if there was a problem with getting an appointment she can call back.  I checked and she has been scheduled with Dr Georgina Snell.

## 2019-09-03 ENCOUNTER — Other Ambulatory Visit: Payer: Self-pay

## 2019-09-03 ENCOUNTER — Ambulatory Visit: Payer: Medicare Other | Admitting: Orthotics

## 2019-09-03 DIAGNOSIS — E1169 Type 2 diabetes mellitus with other specified complication: Secondary | ICD-10-CM

## 2019-09-03 NOTE — Progress Notes (Addendum)

## 2019-09-05 ENCOUNTER — Ambulatory Visit: Payer: Medicare Other | Admitting: Family Medicine

## 2019-09-05 NOTE — Progress Notes (Deleted)
   I, Wendy Poet, LAT, ATC, am serving as scribe for Dr. Lynne Leader.  Amanda Morris is a 69 y.o. female who presents to [place] at [facility] today for L leg pain.  She was last seen by Dr. Georgina Snell on 02/06/19 for her R wrist.  She locates her L leg pain to .  Radiating pain: L LE numbness/tingling: L LE weakness: Aggravating factors: Treatments tried:   Pertinent review of systems: ***  Relevant historical information: ***   Exam:  There were no vitals taken for this visit. General: Well Developed, well nourished, and in no acute distress.   MSK: ***    Lab and Radiology Results No results found for this or any previous visit (from the past 72 hour(s)). No results found.     Assessment and Plan: 69 y.o. female with ***   PDMP not reviewed this encounter. No orders of the defined types were placed in this encounter.  No orders of the defined types were placed in this encounter.    Discussed warning signs or symptoms. Please see discharge instructions. Patient expresses understanding.   ***

## 2019-09-12 ENCOUNTER — Encounter: Payer: Self-pay | Admitting: Sports Medicine

## 2019-09-12 ENCOUNTER — Ambulatory Visit (INDEPENDENT_AMBULATORY_CARE_PROVIDER_SITE_OTHER): Payer: Medicare Other | Admitting: Sports Medicine

## 2019-09-12 ENCOUNTER — Other Ambulatory Visit: Payer: Self-pay

## 2019-09-12 DIAGNOSIS — G5712 Meralgia paresthetica, left lower limb: Secondary | ICD-10-CM | POA: Diagnosis not present

## 2019-09-12 NOTE — Progress Notes (Addendum)
    Procedures performed today:    None.  Independent interpretation of notes and tests performed by another provider:   None.  Brief History, Exam, Impression, and Recommendations:    Meralgia paresthetica, left This is a very pleasant 69 year old female, she has a long history of back pain, and left thigh pain. She has had an L4-L5 fusion, failed back surgery syndrome, and a spinal cord stimulator placed under the care of Dr. Cyndy Freeze. She has had several lumbar epidurals. In addition for a couple of decades she has had pain that she localizes on her anterolateral left thigh but not past the knee. This was suspected to be meralgia paresthetica by Dr. Georgina Snell about a year ago, she did have a hydrodissection of the lateral femoral cutaneous nerve, and per Dr. Clovis Riley report this provided good immediate relief of her discomfort. Since then she has been on gabapentin 300 mg 3 times daily. She continues to have discomfort now in her anterolateral left thigh. I am going to increase her gabapentin to 600 mg 3 times daily, I would also like her to proceed with nerve conduction and EMG to determine if her paresthesias are coming from meralgia paresthetica versus a lumbar source. I have also asked her to lose some weight and wear looser fitting pants. Return to see me after the nerve conduction study for further discussion of treatment options.  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 There is no evidence  of left lumbosacral radiculopathy.     ___________________________________________ Gwen Her. Dianah Field, M.D., ABFM., CAQSM. Primary Care and [place] Instructor of [place] [facility]

## 2019-09-12 NOTE — Assessment & Plan Note (Addendum)
This is a very pleasant 69 year old female, she has a long history of back pain, and left thigh pain. She has had an L4-L5 fusion, failed back surgery syndrome, and a spinal cord stimulator placed under the care of Dr. Cyndy Freeze. She has had several lumbar epidurals. In addition for a couple of decades she has had pain that she localizes on her anterolateral left thigh but not past the knee. This was suspected to be meralgia paresthetica by Dr. Georgina Snell about a year ago, she did have a hydrodissection of the lateral femoral cutaneous nerve, and per Dr. Clovis Riley report this provided good immediate relief of her discomfort. Since then she has been on gabapentin 300 mg 3 times daily. She continues to have discomfort now in her anterolateral left thigh. I am going to increase her gabapentin to 600 mg 3 times daily, I would also like her to proceed with nerve conduction and EMG to determine if her paresthesias are coming from meralgia paresthetica versus a lumbar source. I have also asked her to lose some weight and wear looser fitting pants. Return to see me after the nerve conduction study for further discussion of treatment options.  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 There is no evidence  of left lumbosacral radiculopathy.

## 2019-09-13 ENCOUNTER — Telehealth: Payer: Self-pay

## 2019-09-13 DIAGNOSIS — G5712 Meralgia paresthetica, left lower limb: Secondary | ICD-10-CM

## 2019-09-13 NOTE — Telephone Encounter (Signed)
Amanda Morris was expecting a refill on the gabapentin.

## 2019-09-14 MED ORDER — GABAPENTIN 600 MG PO TABS: 600.0000 mg | ORAL_TABLET | Freq: Three times a day (TID) | ORAL | 3 refills | Status: DC

## 2019-09-14 NOTE — Telephone Encounter (Signed)
Done, switched to 600 mg tablets because we bumped up the dose.

## 2019-09-14 NOTE — Telephone Encounter (Signed)
Unable to leave a message.

## 2019-09-18 ENCOUNTER — Telehealth: Payer: Self-pay | Admitting: Sports Medicine

## 2019-09-18 NOTE — Telephone Encounter (Signed)
Give it a little more time, at least 3 to 4 weeks.  I also need her to do the nerve conduction and EMG before proceeding to the next steps.

## 2019-09-18 NOTE — Telephone Encounter (Signed)
Dr. Selmer Dominion called back and we let her know her new script was ready at the pharmacy for the Gabapentin. She stated she picked it up and her legs are still giving her a fit. - CF

## 2019-09-18 NOTE — Telephone Encounter (Signed)
Unable to leave a message.

## 2019-09-19 ENCOUNTER — Other Ambulatory Visit: Payer: Self-pay | Admitting: Family Medicine

## 2019-09-29 ENCOUNTER — Other Ambulatory Visit: Payer: Self-pay | Admitting: Family Medicine

## 2019-10-22 ENCOUNTER — Other Ambulatory Visit: Payer: Self-pay | Admitting: Family Medicine

## 2019-10-26 DIAGNOSIS — Z713 Dietary counseling and surveillance: Secondary | ICD-10-CM | POA: Diagnosis not present

## 2019-10-26 DIAGNOSIS — E119 Type 2 diabetes mellitus without complications: Secondary | ICD-10-CM | POA: Diagnosis not present

## 2019-10-30 ENCOUNTER — Telehealth: Payer: Self-pay | Admitting: Neurology

## 2019-10-30 ENCOUNTER — Other Ambulatory Visit: Payer: Self-pay

## 2019-10-30 ENCOUNTER — Ambulatory Visit (INDEPENDENT_AMBULATORY_CARE_PROVIDER_SITE_OTHER): Payer: Medicare Other | Admitting: Neurology

## 2019-10-30 ENCOUNTER — Encounter: Payer: Self-pay | Admitting: Neurology

## 2019-10-30 VITALS — BP 117/70 | HR 86 | Ht 61.0 in | Wt 225.0 lb

## 2019-10-30 DIAGNOSIS — M5441 Lumbago with sciatica, right side: Secondary | ICD-10-CM

## 2019-10-30 DIAGNOSIS — M5442 Lumbago with sciatica, left side: Secondary | ICD-10-CM

## 2019-10-30 DIAGNOSIS — R200 Anesthesia of skin: Secondary | ICD-10-CM | POA: Diagnosis not present

## 2019-10-30 DIAGNOSIS — M79652 Pain in left thigh: Secondary | ICD-10-CM | POA: Insufficient documentation

## 2019-10-30 DIAGNOSIS — G8929 Other chronic pain: Secondary | ICD-10-CM

## 2019-10-30 MED ORDER — DULOXETINE HCL 60 MG PO CPEP
60.0000 mg | ORAL_CAPSULE | Freq: Every day | ORAL | 12 refills | Status: DC
Start: 2019-10-30 — End: 2019-11-07

## 2019-10-30 MED ORDER — DULOXETINE HCL 60 MG PO CPEP
60.0000 mg | ORAL_CAPSULE | Freq: Every day | ORAL | 12 refills | Status: DC
Start: 2019-10-30 — End: 2019-10-30

## 2019-10-30 NOTE — Telephone Encounter (Signed)
[employer] medicare order sent to GI. No auth they will reach out to the patient to schedule.  
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
## 2019-10-31 ENCOUNTER — Other Ambulatory Visit: Payer: Self-pay | Admitting: Family Medicine

## 2019-11-06 ENCOUNTER — Other Ambulatory Visit: Payer: Self-pay | Admitting: Neurology

## 2019-11-06 NOTE — Telephone Encounter (Signed)
Called the patient back. There was no answer and a VM has not been set up.

## 2019-11-06 NOTE — Telephone Encounter (Signed)
Pt called stating that she no longer can take the DULoxetine (CYMBALTA) 60 MG capsule due to it upsetting her stomach. Pt would like to speak to RN. Please advise.

## 2019-11-07 NOTE — Telephone Encounter (Signed)
I have spoken with the patient. She does not wish to try the lower dose of duloxetine. She is interested in tapering off gabapentin and trying Lyrica 150mg , one capsule TID.  I have reviewed this request with Dr. Krista Blue. The patient is currently taking gabapentin 600mg , one cap TID. She should decrease her dose to one cap BID x 1 week then decrease to one cap QD x 1 week then stop. She will then start the Lyrica. These instructions were discussed with the patient. She wrote them down and read them back to me correctly.  I also called [facility] and spoke to pharmacist, Ronalee Belts, who voided all the remaining refills of both gabapentin and duloxetine to avoid confusion.

## 2019-11-07 NOTE — Addendum Note (Signed)
Addended by: Desmond Lope on: 11/07/2019 06:08 PM   Modules accepted: Orders

## 2019-11-07 NOTE — Telephone Encounter (Signed)
If Cymbalta is helpful, may decrease from 60 to 30 mg tablets, then gradually titrating up to 60 mg  The other options are Lyrica, she is already on gabapentin 600 mg 3 times daily, if we do decided to switch to Lyrica 150 mg 3 times a day she needs to taper off gabapentin

## 2019-11-07 NOTE — Telephone Encounter (Signed)
Patient returned call. I advised her of Dr. Rhea Belton recommendations and she states that she would like to try the Lyrica. I confirmed pharmacy.   (I apologize, I did not review how to take the Lyrica and how to taper off the gabapentin with her)

## 2019-11-07 NOTE — Addendum Note (Signed)
Addended by: Desmond Lope on: 11/07/2019 06:15 PM   Modules accepted: Orders

## 2019-11-07 NOTE — Telephone Encounter (Signed)
Patient returned call. Please call back after 2:00. She states she is at work and is not able to have her phone.

## 2019-11-08 MED ORDER — PREGABALIN 150 MG PO CAPS: 150.0000 mg | ORAL_CAPSULE | Freq: Three times a day (TID) | ORAL | 5 refills | Status: DC

## 2019-11-09 ENCOUNTER — Encounter: Payer: Self-pay | Admitting: Physician Assistant

## 2019-11-09 ENCOUNTER — Ambulatory Visit (INDEPENDENT_AMBULATORY_CARE_PROVIDER_SITE_OTHER): Payer: Medicare Other | Admitting: Physician Assistant

## 2019-11-09 ENCOUNTER — Other Ambulatory Visit: Payer: Self-pay

## 2019-11-09 ENCOUNTER — Telehealth: Payer: Self-pay | Admitting: Physician Assistant

## 2019-11-09 VITALS — BP 118/63 | HR 71 | Wt 226.0 lb

## 2019-11-09 DIAGNOSIS — I10 Essential (primary) hypertension: Secondary | ICD-10-CM

## 2019-11-09 DIAGNOSIS — R21 Rash and other nonspecific skin eruption: Secondary | ICD-10-CM | POA: Diagnosis not present

## 2019-11-09 DIAGNOSIS — Z6841 Body Mass Index (BMI) 40.0 and over, adult: Secondary | ICD-10-CM | POA: Diagnosis not present

## 2019-11-09 NOTE — Patient Instructions (Signed)
Zyrtec for itching.  Gold bond for sweating and moisture.

## 2019-11-09 NOTE — Progress Notes (Signed)
   Subjective:    Patient ID: Amanda Morris, female    DOB: [birth year], 69 y.o.   MRN: [id number]  HPI  Pt is a 69 yo female who presents to the clinic to follow up on rash on low back and buttocks for last 2 weeks. She used cortisone and resolved a few days ago. It was itchy. No rash today. Unclear etiology.   She does need BP faxed to bariatrics for records.   .. Active Ambulatory Problems    Diagnosis Date Noted  . GERD (gastroesophageal reflux disease) 10/29/2010  . Chronic low back pain 10/29/2010  . Other and unspecified hyperlipidemia 10/29/2010  . SVT (supraventricular tachycardia) ([place]) 11/03/2010  . Type 2 diabetes mellitus with other specified complication ([place]) 87/56/4332  . History of migraine 05/04/2013  . Carotid artery stenosis 06/07/2013  . Hereditary and idiopathic peripheral neuropathy 06/07/2013  . Obesity (BMI 30-39.9) 11/01/2013  . Postprandial vomiting 02/04/2014  . Essential hypertension 02/14/2015  . Spondylolisthesis at L4-L5 level 04/07/2015  . Radiculopathy, lumbar region 04/07/2015  . Spondylolisthesis of lumbar region 06/13/2015  . Fatty liver 09/27/2016  . Failed back syndrome of lumbar spine 11/25/2017  . Status post bilateral breast reduction 03/23/2016  . Dizzy 12/01/2018  . Acute bilateral low back pain without sciatica 12/01/2018  . Orthostatic hypotension 12/01/2018  . De Quervain's disease (tenosynovitis) 02/06/2019  . BMI 40.0-44.9, adult ([place]) 05/07/2019  . Meralgia paresthetica, left 09/12/2019  . Chronic midline low back pain with bilateral sciatica 10/30/2019  . Left thigh pain 10/30/2019  . Numbness 10/30/2019  . Rash and nonspecific skin eruption 11/09/2019  . Class 3 severe obesity due to excess calories with serious comorbidity and body mass index (BMI) of 40.0 to 44.9 in adult [facility]) 11/09/2019   Resolved Ambulatory Problems    Diagnosis Date Noted  . Hypertension 10/29/2010  . Pain in female pelvis 12/02/2014  .  Pendulous breast 08/12/2015  . Needs flu shot 12/23/2015   Past Medical History:  Diagnosis Date  . Allergy   . Diabetes mellitus without complication ([place])   . Headache   . History of hiatal hernia   . Hypercholesterolemia   . Palpitations   . Postlaminectomy syndrome      Review of Systems  All other systems reviewed and are negative.      Objective:   Physical Exam Vitals reviewed.  Constitutional:      Appearance: Normal appearance. She is obese.  Cardiovascular:     Rate and Rhythm: Normal rate.  Pulmonary:     Effort: Pulmonary effort is normal.  Skin:    Comments: No rash.   Neurological:     General: No focal deficit present.     Mental Status: She is alert and oriented to person, place, and time.  Psychiatric:        Mood and Affect: Mood normal.           Assessment & Plan:  Marland KitchenMarland KitchenTaneasha was seen today for rash.  Diagnoses and all orders for this visit:  Rash and nonspecific skin eruption  Essential hypertension  Class 3 severe obesity due to excess calories with serious comorbidity and body mass index (BMI) of 40.0 to 44.9 in adult [facility])   Rash resolved. Likely contact dermatitis. Discussed watch for irritants. Follow up if rash returns.   BP great today. Will send today note to bariatric at patients request to show BP reading in office for future medication management.

## 2019-11-09 NOTE — Telephone Encounter (Signed)
Patient stopped back by today after her appointment saying she is going to see the Foot Dr. Next Friday at 8:15am here, and he was bringing the shoes with him, and said if she has any questions you can cell her at [phone].

## 2019-11-09 NOTE — Telephone Encounter (Signed)
Perfect. Thank you!

## 2019-11-12 ENCOUNTER — Encounter: Payer: Self-pay | Admitting: Family Medicine

## 2019-11-12 ENCOUNTER — Ambulatory Visit (INDEPENDENT_AMBULATORY_CARE_PROVIDER_SITE_OTHER): Payer: Medicare Other | Admitting: Family Medicine

## 2019-11-12 ENCOUNTER — Ambulatory Visit (HOSPITAL_BASED_OUTPATIENT_CLINIC_OR_DEPARTMENT_OTHER): Payer: Medicare Other

## 2019-11-12 ENCOUNTER — Ambulatory Visit (INDEPENDENT_AMBULATORY_CARE_PROVIDER_SITE_OTHER): Payer: Medicare Other

## 2019-11-12 ENCOUNTER — Other Ambulatory Visit: Payer: Self-pay

## 2019-11-12 VITALS — BP 134/74 | HR 76 | Ht 61.0 in | Wt 223.0 lb

## 2019-11-12 DIAGNOSIS — M79602 Pain in left arm: Secondary | ICD-10-CM

## 2019-11-12 DIAGNOSIS — R202 Paresthesia of skin: Secondary | ICD-10-CM

## 2019-11-12 DIAGNOSIS — I10 Essential (primary) hypertension: Secondary | ICD-10-CM

## 2019-11-12 DIAGNOSIS — M542 Cervicalgia: Secondary | ICD-10-CM

## 2019-11-12 DIAGNOSIS — E1169 Type 2 diabetes mellitus with other specified complication: Secondary | ICD-10-CM | POA: Diagnosis not present

## 2019-11-12 DIAGNOSIS — M79601 Pain in right arm: Secondary | ICD-10-CM | POA: Insufficient documentation

## 2019-11-12 LAB — POCT GLYCOSYLATED HEMOGLOBIN (HGB A1C): Hemoglobin A1C: 6.8 % — AB (ref 4.0–5.6)

## 2019-11-12 NOTE — Assessment & Plan Note (Signed)
Suspect cervical disc issue.  Without any trauma or injury unclear why it may have started to aggravate her more recently.  We will start with cervical spine films.  Need to get additional testing for further work-up including nerve conduction and/or MRI of the cervical spine but will start with plain films.

## 2019-11-12 NOTE — Assessment & Plan Note (Signed)
A1c looks great today at 6.8 but I am concerned that she still having some hypoglycemic events.  She had one as low as 54 and several in the low 70s first thing in the morning.  But most of them look fantastic.  We will decrease her insulin dose down to 36 units from 38 units.due for urine micro albumin.

## 2019-11-12 NOTE — Assessment & Plan Note (Signed)
Well controlled. Continue current regimen. Follow up in  3-4 month

## 2019-11-12 NOTE — Progress Notes (Signed)
Established Patient Office Visit  Subjective:  Patient ID: Amanda Morris, female    DOB: 02-21-1951  Age: 69 y.o. MRN: [id number]  CC:  Chief Complaint  Patient presents with  . Diabetes    HPI [name] presents for   Diabetes - no hypoglycemic events. No wounds or sores that are not healing well. No increased thirst or urination. Checking glucose at home. Taking medications as prescribed without any side effects.  Brought in home glucose log.  She still using 38 units of Lantus.  Hypertension- Pt denies chest pain, SOB, dizziness, or heart palpitations.  Taking meds as directed w/o problems.  Denies medication side effects.  She has lost about 2 more lbs.    She also reports some pins-and-needles sensation in both of her arms she says it is definitely worse in the left arm.  It feels like it is often times in the forearm radiating all the way down to her thumb and sometimes it feels like it is going from her thumb all the way up to her neck.  She says she notices it some in the right but had an old injury to her right shoulder back in 2011.  She feels like it is difficult sometimes to raise her arms to do her hair because of the pain and discomfort she denies any recent injury or trauma.  She feels like sometimes it is hard to pick things up it almost feels weak in her left hand.    Past Medical History:  Diagnosis Date  . Allergy   . Diabetes mellitus without complication ([place])   . GERD (gastroesophageal reflux disease)   . Headache    migraines last one 2 weeks ago  . History of hiatal hernia   . Hypercholesterolemia   . Numbness    left, outer thigh  . Palpitations    in the past, no current issues per patient  . Postlaminectomy syndrome     Past Surgical History:  Procedure Laterality Date  . ABDOMINAL HYSTERECTOMY  1982  . BACK SURGERY    . BREAST REDUCTION SURGERY Bilateral 03/17/2016   Procedure: MAMMARY REDUCTION  (BREAST);  Surgeon: Wallace Going, DO;  Location: [facility];  Service: Plastics;  Laterality: Bilateral;  . SHOULDER SURGERY  06/2008, 09/2010   Right, by Dr. Karie Soda, then Dr. Judeth Horn  . SPINAL CORD STIMULATOR INSERTION N/A 12/02/2017   Procedure: LUMBAR SPINAL CORD STIMULATOR INSERTION;  Surgeon: Ashok Pall, MD;  Location: [facility];  Service: Neurosurgery;  Laterality: N/A;  . SPINAL CORD STIMULATOR TRIAL N/A 11/25/2017   Procedure: INSERTION LUMBAR SPINAL CORD STIMULATOR TRIAL  ;  Surgeon: Ashok Pall, MD;  Location: [facility];  Service: Neurosurgery;  Laterality: N/A;   INSERTION LUMBAR SPINAL CORD STIMULATOR TRIAL      Family History  Problem Relation Age of Onset  . Heart disease Mother   . Diabetes Mother   . Hypertension Mother   . Stroke Mother   . Heart disease Father   . Heart disease Sister   . Diabetes Sister   . Hyperlipidemia Sister   . Hypertension Sister   . Stroke Sister   . Alcohol abuse Brother   . Hyperlipidemia Brother     Social History   Socioeconomic History  . Marital status: Divorced    Spouse name: Not on file  . Number of children: 1  . Years of education: 9th grade  . Highest education level: 9th grade  Occupational  History  . Occupation: Disabled.     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 Smoking status: Former Smoker    Types: Cigarettes    Quit date: 07/30/2007    Years since quitting: 12.2  . Smokeless tobacco: Never Used  Vaping Use  . Vaping Use: Never used  Substance and Sexual Activity  . Alcohol use: No  . Drug use: No  . Sexual activity: Never  Other Topics Concern  . Not on file  Social History Narrative   Lives alone.   Right-handed.   No daily caffeine use.   Social Determinants of Health   Financial Resource Strain:   . Difficulty of Paying Living Expenses:   Food Insecurity:   . Worried About Charity fundraiser in the Last Year:   . Arboriculturist in the Last Year:   Transportation Needs:   . Lexicographer (Medical):   Marland Kitchen Lack of Transportation (Non-Medical):   Physical Activity:   . Days of Exercise per Week:   . Minutes of Exercise per Session:   Stress:   . Feeling of Stress :   Social Connections:   . Frequency of Communication with Friends and Family:   . Frequency of Social Gatherings with Friends and Family:   . Attends Religious Services:   . Active Member of Clubs or Organizations:   . Attends Archivist Meetings:   Marland Kitchen Marital Status:   Intimate Partner Violence:   . Fear of Current or Ex-Partner:   . Emotionally Abused:   Marland Kitchen Physically Abused:   . Sexually Abused:     Outpatient Medications Prior to Visit  Medication Sig Dispense Refill  . diclofenac Sodium (VOLTAREN) 1 % GEL Apply 1 application topically 4 (four) times daily.    Marland Kitchen ACCU-CHEK AVIVA PLUS test strip FOR TESTING BLOOD SUGARS 3 TIMES DAILY. DX:E11.9 300 strip 12  . Accu-Chek Softclix Lancets lancets USE AS DIRECTED TO TEST BLOOD SUGAR 3 TIMES A DAY 100 each prn  . aspirin EC 81 MG tablet Take 1 tablet (81 mg total) by mouth daily.    . B-D ULTRAFINE III SHORT PEN 31G X 8 MM MISC USE AS DIRECTED 100 each 6  . blood glucose meter kit and supplies KIT Dispense glucometer, test strips, and lancets. Use to check blood sugar twice daily. Dx: E11.9 1 each 0  . cholecalciferol (VITAMIN D) 1000 units tablet Take 1,000 Units by mouth daily.    Marland Kitchen DEXILANT 60 MG capsule Take 60 mg by mouth daily.   3  . dicyclomine (BENTYL) 20 MG tablet Take 20 mg by mouth 4 (four) times daily.    Marland Kitchen doxepin (SINEQUAN) 10 MG capsule Take 10 mg by mouth at bedtime.    . famotidine (PEPCID) 20 MG tablet     . fluticasone (FLONASE) 50 MCG/ACT nasal spray PLACE 1 SPRAY INTO BOTH NOSTRILS DAILY. 48 mL 4  . glipiZIDE (GLUCOTROL) 10 MG tablet TAKE 1 TABLET BY MOUTH TWICE A DAY 180 tablet 1  . insulin glargine (LANTUS SOLOSTAR) 100 UNIT/ML Solostar Pen Inject 38 Units into the skin at bedtime. 15 mL 5  . JARDIANCE 25 MG TABS  tablet TAKE 25 MG BY MOUTH DAILY. 90 tablet 1  . levocetirizine (XYZAL) 5 MG tablet TAKE 1 TABLET BY MOUTH EVERY DAY IN THE EVENING 90 tablet 3  . Multiple Vitamins-Minerals (MULTIVITAMIN WOMEN 50+ PO) Take 1 capsule by mouth daily.    . naproxen  sodium (ALEVE) 220 MG tablet Take 220 mg by mouth daily as needed.    . Omega-3 1000 MG CAPS Take 1 capsule by mouth daily.    Marland Kitchen perphenazine (TRILAFON) 8 MG tablet TAKE ONE TABLET (8 MG TOTAL) BY MOUTH AT BEDTIME.  2  . pravastatin (PRAVACHOL) 40 MG tablet TAKE 1 TABLET BY MOUTH EVERYDAY AT BEDTIME 90 tablet 3  . pregabalin (LYRICA) 150 MG capsule Take 1 capsule (150 mg total) by mouth in the morning, at noon, and at bedtime. 90 capsule 5  . diclofenac sodium (VOLTAREN) 1 % GEL Apply 4 g topically 4 (four) times daily. To affected joint. 100 g 11   No facility-administered medications prior to visit.    Allergies  Allergen Reactions  . Penicillins Hives and Other (See Comments)    PATIENT HAS HAD A PCN REACTION WITH IMMEDIATE RASH, FACIAL/TONGUE/THROAT SWELLING, SOB, OR LIGHTHEADEDNESS WITH HYPOTENSION:  #  #  YES  #  #  Has patient had a PCN reaction causing severe rash involving mucus membranes or skin necrosis: No Has patient had a PCN reaction that required hospitalization: No Has patient had a PCN reaction occurring within the last 10 years: No If all of the above answers are "NO", then may proceed with Cephalosporin use.   . Duloxetine Other (See Comments)    GI upset  . Aspirin Nausea Only  . Codeine Itching  . Hydrocodone Nausea Only  . Metformin And Related Diarrhea and Nausea Only  . Oxycodone Nausea And Vomiting    Other reaction(s): GI Upset (intolerance)  . Victoza [Liraglutide] Other (See Comments)    Abdominal pain    ROS Review of Systems    Objective:    Physical Exam Constitutional:      Appearance: She is well-developed.  HENT:     Head: Normocephalic and atraumatic.  Cardiovascular:     Rate and Rhythm:  Normal rate and regular rhythm.     Heart sounds: Normal heart sounds.  Pulmonary:     Effort: Pulmonary effort is normal.     Breath sounds: Normal breath sounds.  Musculoskeletal:     Comments: Difficulty with extension of her left arm 280.  Also difficulty with internal rotation.  She has weakness with empty can test of the left shoulder.  Normal range of motion of the shoulders and wrist and fingers.  Skin:    General: Skin is warm and dry.  Neurological:     Mental Status: She is alert and oriented to person, place, and time.  Psychiatric:        Behavior: Behavior normal.     BP 134/74   Pulse 76   Ht 5' 1"  (1.549 m)   Wt 223 lb (101.2 kg)   SpO2 98%   BMI 42.14 kg/m  Wt Readings from Last 3 Encounters:  11/12/19 223 lb (101.2 kg)  11/09/19 226 lb (102.5 kg)  10/30/19 225 lb (102.1 kg)     Health Maintenance Due  Topic Date Due  . URINE MICROALBUMIN  07/11/2019  . OPHTHALMOLOGY EXAM  11/02/2019    There are no preventive care reminders to display for this patient.  Lab Results  Component Value Date   TSH 1.45 01/22/2015   Lab Results  Component Value Date   WBC 9.7 11/21/2017   HGB 12.8 11/21/2017   HCT 42.9 11/21/2017   MCV 80.9 11/21/2017   PLT 273 11/21/2017   Lab Results  Component Value Date   NA 142 08/06/2019  K 4.0 08/06/2019   CO2 26 08/06/2019   GLUCOSE 122 (H) 08/06/2019   BUN 13 08/06/2019   CREATININE 0.77 08/06/2019   BILITOT 0.3 02/05/2019   ALKPHOS 102 09/21/2016   AST 19 02/05/2019   ALT 15 02/05/2019   PROT 7.0 02/05/2019   ALBUMIN 3.8 09/21/2016   CALCIUM 9.0 08/06/2019   ANIONGAP 12 11/21/2017   Lab Results  Component Value Date   CHOL 151 02/05/2019   Lab Results  Component Value Date   HDL 37 (L) 02/05/2019   Lab Results  Component Value Date   LDLCALC 90 02/05/2019   Lab Results  Component Value Date   TRIG 139 02/05/2019   Lab Results  Component Value Date   CHOLHDL 4.1 02/05/2019   Lab Results   Component Value Date   HGBA1C 6.8 (A) 11/12/2019      Assessment & Plan:   Problem List Items Addressed This Visit      Cardiovascular and Mediastinum   Essential hypertension    Well controlled. Continue current regimen. Follow up in  3-4 month        Endocrine   Type 2 diabetes mellitus with other specified complication (HCC) - Primary    A1c looks great today at 6.8 but I am concerned that she still having some hypoglycemic events.  She had one as low as 54 and several in the low 70s first thing in the morning.  But most of them look fantastic.  We will decrease her insulin dose down to 36 units from 38 units.due for urine micro albumin.        Relevant Orders   POCT glycosylated hemoglobin (Hb A1C) (Completed)     Other   Paresthesia and pain of both upper extremities    Suspect cervical disc issue.  Without any trauma or injury unclear why it may have started to aggravate her more recently.  We will start with cervical spine films.  Need to get additional testing for further work-up including nerve conduction and/or MRI of the cervical spine but will start with plain films.      Relevant Orders   DG Cervical Spine Complete      No orders of the defined types were placed in this encounter.   Follow-up: Return in about 3 months (around 02/12/2020) for Diabetes follow-up.    Beatrice Lecher, MD

## 2019-11-12 NOTE — Patient Instructions (Signed)
Because you are still having some lows I want you to crease your insulin to 36 units.

## 2019-11-16 ENCOUNTER — Encounter: Payer: Self-pay | Admitting: Podiatry

## 2019-11-16 ENCOUNTER — Other Ambulatory Visit: Payer: Self-pay

## 2019-11-16 ENCOUNTER — Ambulatory Visit (INDEPENDENT_AMBULATORY_CARE_PROVIDER_SITE_OTHER): Payer: Medicare Other | Admitting: Podiatry

## 2019-11-16 ENCOUNTER — Telehealth: Payer: Self-pay | Admitting: Family Medicine

## 2019-11-16 VITALS — Temp 97.6°F

## 2019-11-16 DIAGNOSIS — E1169 Type 2 diabetes mellitus with other specified complication: Secondary | ICD-10-CM | POA: Diagnosis not present

## 2019-11-16 DIAGNOSIS — M79674 Pain in right toe(s): Secondary | ICD-10-CM

## 2019-11-16 DIAGNOSIS — L84 Corns and callosities: Secondary | ICD-10-CM | POA: Diagnosis not present

## 2019-11-16 DIAGNOSIS — M79675 Pain in left toe(s): Secondary | ICD-10-CM

## 2019-11-16 DIAGNOSIS — B351 Tinea unguium: Secondary | ICD-10-CM

## 2019-11-16 MED ORDER — CICLOPIROX 8 % EX SOLN: Freq: Every day | CUTANEOUS | 4 refills | Status: DC

## 2019-11-16 NOTE — Progress Notes (Signed)
Subjective: 69 y.o. returns the office today for painful, elongated, thickened toenails which she cannot trim herself and also to PUDS. Denies any redness or drainage around the nails.  She also states that the right second toenails becoming thickened discolored causing discomfort.  Denies any acute changes since last appointment and no new complaints today. Denies any systemic complaints such as fevers, chills, nausea, vomiting.   PCP: Hali Marry, MD Last Seen: 11/12/2019  A1c: 6.8 on 11/12/2019  Objective: AAO 3, NAD DP/PT pulses palpable, CRT less than 3 seconds Nails hypertrophic, dystrophic, elongated, brittle, discolored 10. There is tenderness overlying the nails 1-5 bilaterally. There is no surrounding erythema or drainage along the nail sites.  In particular the right second toenail is most hypertrophic and has brown discoloration.  There is no hyperpigmented changes. Hyperkeratotic lesions bilateral medial hallux as well as plantar heels.  No ongoing ulceration drainage or signs of infection.  No open lesions identified otherwise.. No other areas of tenderness bilateral lower extremities. No overlying edema, erythema, increased warmth. No pain with calf compression, swelling, warmth, erythema.  Assessment: Patient presents with symptomatic onychomycosis; hyperkeratotic lesions  Plan: -Treatment options including alternatives, risks, complications were discussed -Nails sharply debrided 10 without complication/bleeding.  Prescribed Penlac and directed on use, side effects and success rates. -Hyperkeratotic lesion sharply debrided x4 without complications or bleeding. -Diabetic shoes were not fitting appropriately if they were too big.  We remolded her today -Discussed daily foot inspection. If there are any changes, to call the office immediately.  -Follow-up in 3 months or sooner if any problems are to arise. In the meantime, encouraged to call the office with any  questions, concerns, changes symptoms.  Celesta Gentile, DPM

## 2019-11-16 NOTE — Telephone Encounter (Signed)
Pt advised. Eye exam requested from [facility].

## 2019-11-16 NOTE — Telephone Encounter (Signed)
Please try to give patient a call again about her x-ray results.  Also see if she is scheduled an eye exam if not we can try to get her scheduled with Dr. Gwenevere Ghazi downstairs.

## 2019-11-16 NOTE — Patient Instructions (Addendum)
Diabetes Mellitus and Foot Care Foot care is an important part of your health, especially when you have diabetes. Diabetes may cause you to have problems because of poor blood flow (circulation) to your feet and legs, which can cause your skin to:  Become thinner and drier.  Break more easily.  Heal more slowly.  Peel and crack. You may also have nerve damage (neuropathy) in your legs and feet, causing decreased feeling in them. This means that you may not notice minor injuries to your feet that could lead to more serious problems. Noticing and addressing any potential problems early is the best way to prevent future foot problems. How to care for your feet Foot hygiene  Wash your feet daily with warm water and mild soap. Do not use hot water. Then, pat your feet and the areas between your toes until they are completely dry. Do not soak your feet as this can dry your skin.  Trim your toenails straight across. Do not dig under them or around the cuticle. File the edges of your nails with an emery board or nail file.  Apply a moisturizing lotion or petroleum jelly to the skin on your feet and to dry, brittle toenails. Use lotion that does not contain alcohol and is unscented. Do not apply lotion between your toes. Shoes and socks  Wear clean socks or stockings every day. Make sure they are not too tight. Do not wear knee-high stockings since they may decrease blood flow to your legs.  Wear shoes that fit properly and have enough cushioning. Always look in your shoes before you put them on to be sure there are no objects inside.  To break in new shoes, wear them for just a few hours a day. This prevents injuries on your feet. Wounds, scrapes, corns, and calluses  Check your feet daily for blisters, cuts, bruises, sores, and redness. If you cannot see the bottom of your feet, use a mirror or ask someone for help.  Do not cut corns or calluses or try to remove them with medicine.  If you  find a minor scrape, cut, or break in the skin on your feet, keep it and the skin around it clean and dry. You may clean these areas with mild soap and water. Do not clean the area with peroxide, alcohol, or iodine.  If you have a wound, scrape, corn, or callus on your foot, look at it several times a day to make sure it is healing and not infected. Check for: ? Redness, swelling, or pain. ? Fluid or blood. ? Warmth. ? Pus or a bad smell. General instructions  Do not cross your legs. This may decrease blood flow to your feet.  Do not use heating pads or hot water bottles on your feet. They may burn your skin. If you have lost feeling in your feet or legs, you may not know this is happening until it is too late.  Protect your feet from hot and cold by wearing shoes, such as at the beach or on hot pavement.  Schedule a complete foot exam at least once a year (annually) or more often if you have foot problems. If you have foot problems, report any cuts, sores, or bruises to your health care provider immediately. Contact a health care provider if:  You have a medical condition that increases your risk of infection and you have any cuts, sores, or bruises on your feet.  You have an injury that is not   healing.  You have redness on your legs or feet.  You feel burning or tingling in your legs or feet.  You have pain or cramps in your legs and feet.  Your legs or feet are numb.  Your feet always feel cold.  You have pain around a toenail. Get help right away if:  You have a wound, scrape, corn, or callus on your foot and: ? You have pain, swelling, or redness that gets worse. ? You have fluid or blood coming from the wound, scrape, corn, or callus. ? Your wound, scrape, corn, or callus feels warm to the touch. ? You have pus or a bad smell coming from the wound, scrape, corn, or callus. ? You have a fever. ? You have a red line going up your leg. Summary  Check your feet every day  for cuts, sores, red spots, swelling, and blisters.  Moisturize feet and legs daily.  Wear shoes that fit properly and have enough cushioning.  If you have foot problems, report any cuts, sores, or bruises to your health care provider immediately.  Schedule a complete foot exam at least once a year (annually) or more often if you have foot problems. This information is not intended to replace advice given to you by your health care provider. Make sure you discuss any questions you have with your health care provider. Document Revised: 01/03/2019 Document Reviewed: 05/14/2016 Elsevier Patient Education  [place].  Ciclopirox nail solution What is this medicine? CICLOPIROX (sye kloe PEER ox) NAIL SOLUTION is an antifungal medicine. It used to treat fungal infections of the nails. This medicine may be used for other purposes; ask your health care provider or pharmacist if you have questions. COMMON BRAND NAME(S): CNL8, Penlac What should I tell my health care provider before I take this medicine? They need to know if you have any of these conditions:  diabetes mellitus  history of seizures  HIV infection  immune system problems or organ transplant  large areas of burned or damaged skin  peripheral vascular disease or poor circulation  taking corticosteroid medication (including steroid inhalers, cream, or lotion)  an unusual or allergic reaction to ciclopirox, isopropyl alcohol, other medicines, foods, dyes, or preservatives  pregnant or trying to get pregnant  breast-feeding How should I use this medicine? This medicine is for external use only. Follow the directions that come with this medicine exactly. Wash and dry your hands before use. Avoid contact with the eyes, mouth or nose. If you do get this medicine in your eyes, rinse out with plenty of cool tap water. Contact your doctor or health care professional if eye irritation occurs. Use at regular intervals. Do  not use your medicine more often than directed. Finish the full course prescribed by your doctor or health care professional even if you think you are better. Do not stop using except on your doctor's advice. Talk to your pediatrician regarding the use of this medicine in children. While this medicine may be prescribed for children as young as 12 years for selected conditions, precautions do apply. Overdosage: If you think you have taken too much of this medicine contact a poison control center or emergency room at once. NOTE: This medicine is only for you. Do not share this medicine with others. What if I miss a dose? If you miss a dose, use it as soon as you can. If it is almost time for your next dose, use only that dose. Do not use  double or extra doses. What may interact with this medicine? Interactions are not expected. Do not use any other skin products without telling your doctor or health care professional. This list may not describe all possible interactions. Give your health care provider a list of all the medicines, herbs, non-prescription drugs, or dietary supplements you use. Also tell them if you smoke, drink alcohol, or use illegal drugs. Some items may interact with your medicine. What should I watch for while using this medicine? Tell your doctor or health care professional if your symptoms get worse. Four to six months of treatment may be needed for the nail(s) to improve. Some people may not achieve a complete cure or clearing of the nails by this time. Tell your doctor or health care professional if you develop sores or blisters that do not heal properly. If your nail infection returns after stopping using this product, contact your doctor or health care professional. What side effects may I notice from receiving this medicine? Side effects that you should report to your doctor or health care professional as soon as possible:  allergic reactions like skin rash, itching or hives,  swelling of the face, lips, or tongue  severe irritation, redness, burning, blistering, peeling, swelling, oozing Side effects that usually do not require medical attention (report to your doctor or health care professional if they continue or are bothersome):  mild reddening of the skin  nail discoloration  temporary burning or mild stinging at the site of application This list may not describe all possible side effects. Call your doctor for medical advice about side effects. You may report side effects to FDA at 1-800-FDA-1088. Where should I keep my medicine? Keep out of the reach of children. Store at room temperature between 15 and 30 degrees C (59 and 86 degrees F). Do not freeze. Protect from light by storing the bottle in the carton after every use. This medicine is flammable. Keep away from heat and flame. Throw away any unused medicine after the expiration date. NOTE: This sheet is a summary. It may not cover all possible information. If you have questions about this medicine, talk to your doctor, pharmacist, or health care provider.  2020 Elsevier/Gold Standard (2007-07-17 16:49:20)

## 2019-11-19 ENCOUNTER — Other Ambulatory Visit: Payer: Self-pay | Admitting: Family Medicine

## 2019-11-19 DIAGNOSIS — M79601 Pain in right arm: Secondary | ICD-10-CM

## 2019-11-19 DIAGNOSIS — M79602 Pain in left arm: Secondary | ICD-10-CM

## 2019-11-19 NOTE — Progress Notes (Signed)
Nerve conduction ordered.

## 2019-11-28 ENCOUNTER — Ambulatory Visit (INDEPENDENT_AMBULATORY_CARE_PROVIDER_SITE_OTHER): Payer: Medicare Other | Admitting: Neurology

## 2019-11-28 ENCOUNTER — Telehealth: Payer: Self-pay | Admitting: Neurology

## 2019-11-28 DIAGNOSIS — R200 Anesthesia of skin: Secondary | ICD-10-CM

## 2019-11-28 DIAGNOSIS — M79652 Pain in left thigh: Secondary | ICD-10-CM | POA: Diagnosis not present

## 2019-11-28 DIAGNOSIS — R799 Abnormal finding of blood chemistry, unspecified: Secondary | ICD-10-CM | POA: Diagnosis not present

## 2019-11-28 DIAGNOSIS — E559 Vitamin D deficiency, unspecified: Secondary | ICD-10-CM | POA: Diagnosis not present

## 2019-11-28 DIAGNOSIS — G6289 Other specified polyneuropathies: Secondary | ICD-10-CM

## 2019-11-28 DIAGNOSIS — G629 Polyneuropathy, unspecified: Secondary | ICD-10-CM | POA: Insufficient documentation

## 2019-11-28 DIAGNOSIS — G8929 Other chronic pain: Secondary | ICD-10-CM

## 2019-11-28 DIAGNOSIS — R946 Abnormal results of thyroid function studies: Secondary | ICD-10-CM | POA: Diagnosis not present

## 2019-11-28 DIAGNOSIS — D519 Vitamin B12 deficiency anemia, unspecified: Secondary | ICD-10-CM | POA: Diagnosis not present

## 2019-11-28 MED ORDER — PREGABALIN 200 MG PO CAPS: 200.0000 mg | ORAL_CAPSULE | Freq: Three times a day (TID) | ORAL | 5 refills | Status: DC

## 2019-11-28 NOTE — Telephone Encounter (Signed)
Please make sure patient is on schedule for CT Femur as previously orderred

## 2019-11-28 NOTE — Procedures (Signed)
Full Name: Jordyan Hardiman Gender: Female MRN #: [id number] Date of Birth: Oct 08, 1950    Visit Date: 11/28/2019 07:27 Age: 69 Years Examining Physician: Marcial Pacas, MD  Referring Physician: Marcial Pacas, MD Height: 5 feet 1 inch History: 69 year old female presenting with sudden onset left lateral thigh area pain more than 20 years ago, progressively worse  Summary of the test: Nerve conduction study: Bilateral sural, superficial peroneal sensory responses showed mildly decreased snap amplitude.  Bilateral tibial motor responses showed mild to moderately decreased CMAP amplitude, with mildly slow conduction velocity.  Bilateral peroneal to EDB motor responses were normal  Electromyography:  Selected needle examination of left lower extremity muscles and left lumbosacral paraspinal muscles were performed  The only abnormality is increased insertional activity, complex motor unit potential with mildly decreased recruitment at left abductor hallucis.   Conclusion: This is an abnormal study.  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  There is no evidence of left lumbosacral radiculopathy.    ------------------------------- Marcial Pacas, M.D. PhD  [facility] [address], [place] [address] Tel: [phone] Fax: [phone]  Verbal informed consent was obtained from the patient, patient was informed of potential risk of procedure, including bruising, bleeding, hematoma formation, infection, muscle weakness, muscle pain, numbness, among others.         [place]    Nerve / Sites Muscle Latency Ref. Amplitude Ref. Rel Amp Segments Distance Velocity Ref. Area    ms ms mV mV %  cm m/s m/s mVms  L Peroneal - EDB     Ankle EDB 4.2 ?6.5 5.7 ?2.0 100 Ankle - EDB 9   16.4     Fib head EDB 10.1  4.3  75.4 Fib head - Ankle 28 47 ?44 15.2     Pop fossa EDB 12.4  4.2  97.6 Pop fossa - Fib head 10 45 ?44 15.4         Pop fossa  - Ankle      R Peroneal - EDB     Ankle EDB 4.2 ?6.5 8.4 ?2.0 100 Ankle - EDB 9   21.7     Fib head EDB 10.4  6.5  77.2 Fib head - Ankle 29 46 ?44 20.8     Pop fossa EDB 12.6  6.2  94.7 Pop fossa - Fib head 10 47 ?44 18.4         Pop fossa - Ankle      L Tibial - AH     Ankle AH 4.1 ?5.8 1.8 ?4.0 100 Ankle - AH 9   3.5     Pop fossa AH 14.5  1.4  77.2 Pop fossa - Ankle 36 34 ?41 2.3  R Tibial - AH     Ankle AH 3.8 ?5.8 3.8 ?4.0 100 Ankle - AH 9   9.8     Pop fossa AH 13.1  2.3  60.4 Pop fossa - Ankle 38 41 ?41 10.1             SNC    Nerve / Sites Rec. Site Peak Lat Ref.  Amp Ref. Segments Distance    ms ms V V  cm  L Sural - Ankle (Calf)     Calf Ankle 3.5 ?4.4 4 ?6 Calf - Ankle 14  R Sural - Ankle (Calf)     Calf Ankle 3.0 ?4.4 6 ?6 Calf - Ankle 14  L Superficial peroneal - Ankle  Lat leg Ankle 3.7 ?4.4 3 ?6 Lat leg - Ankle 14  R Superficial peroneal - Ankle     Lat leg Ankle 3.7 ?4.4 3 ?6 Lat leg - Ankle 14             F  Wave    Nerve F Lat Ref.   ms ms  L Tibial - AH 56.7 ?56.0  R Tibial - AH 55.0 ?56.0         EMG Summary Table    Spontaneous MUAP Recruitment  Muscle IA Fib PSW Fasc Other Amp Dur. Poly Pattern  L. Tibialis anterior Normal None None None _______ Normal Normal Normal Normal  L. Tibialis posterior Normal None None None _______ Normal Normal Normal Normal  L. Peroneus longus Normal None None None _______ Normal Normal Normal Normal  L. Gastrocnemius (Medial head) Normal None None None _______ Normal Normal Normal Normal  L. Abductor hallucis Increased 1+ None None _______ Increased Increased 1+ Normal  L. Biceps femoris (long head) Normal None None None _______ Normal Normal Normal Normal  L. Biceps femoris (short head) Normal None None None _______ Normal Normal Normal Normal  L. Gluteus medius Normal None None None _______ Normal Normal Normal Normal  L. Lumbar paraspinals (low) Normal None None None _______ Normal Normal Normal Normal  L. Lumbar  paraspinals (mid) Normal None None None _______ Normal Normal Normal Normal
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
## 2019-11-28 NOTE — Telephone Encounter (Signed)
[facility] has tried to reach out to the patient to schedule but no voicemail is set up.

## 2019-12-03 LAB — CBC WITH DIFFERENTIAL
Basophils Absolute: 0.1 10*3/uL (ref 0.0–0.2)
Basos: 1 %
EOS (ABSOLUTE): 0.2 10*3/uL (ref 0.0–0.4)
Eos: 2 %
Hematocrit: 41.5 % (ref 34.0–46.6)
Hemoglobin: 12.6 g/dL (ref 11.1–15.9)
Immature Grans (Abs): 0 10*3/uL (ref 0.0–0.1)
Immature Granulocytes: 0 %
Lymphocytes Absolute: 2.4 10*3/uL (ref 0.7–3.1)
Lymphs: 31 %
MCH: 23.5 pg — ABNORMAL LOW (ref 26.6–33.0)
MCHC: 30.4 g/dL — ABNORMAL LOW (ref 31.5–35.7)
MCV: 77 fL — ABNORMAL LOW (ref 79–97)
Monocytes Absolute: 0.7 10*3/uL (ref 0.1–0.9)
Monocytes: 9 %
Neutrophils Absolute: 4.3 10*3/uL (ref 1.4–7.0)
Neutrophils: 57 %
RBC: 5.36 x10E6/uL — ABNORMAL HIGH (ref 3.77–5.28)
RDW: 16.2 % — ABNORMAL HIGH (ref 11.7–15.4)
WBC: 7.7 10*3/uL (ref 3.4–10.8)

## 2019-12-03 LAB — MULTIPLE MYELOMA PANEL, SERUM
Albumin SerPl Elph-Mcnc: 3.3 g/dL (ref 2.9–4.4)
Albumin/Glob SerPl: 1.1 (ref 0.7–1.7)
Alpha 1: 0.2 g/dL (ref 0.0–0.4)
Alpha2 Glob SerPl Elph-Mcnc: 0.7 g/dL (ref 0.4–1.0)
B-Globulin SerPl Elph-Mcnc: 1.4 g/dL — ABNORMAL HIGH (ref 0.7–1.3)
Gamma Glob SerPl Elph-Mcnc: 0.9 g/dL (ref 0.4–1.8)
Globulin, Total: 3.3 g/dL (ref 2.2–3.9)
IgA/Immunoglobulin A, Serum: 382 mg/dL — ABNORMAL HIGH (ref 87–352)
IgG (Immunoglobin G), Serum: 912 mg/dL (ref 586–1602)
IgM (Immunoglobulin M), Srm: 85 mg/dL (ref 26–217)

## 2019-12-03 LAB — CK: Total CK: 141 U/L (ref 32–182)

## 2019-12-03 LAB — FOLATE: Folate: >20 ng/mL (ref >3.0)

## 2019-12-03 LAB — VITAMIN B12: Vitamin B-12: 642 pg/mL (ref 232–1245)

## 2019-12-03 LAB — COMPREHENSIVE METABOLIC PANEL
ALT: 15 IU/L (ref 0–32)
AST: 19 IU/L (ref 0–40)
Albumin/Globulin Ratio: 1.4 (ref 1.2–2.2)
Albumin: 3.9 g/dL (ref 3.8–4.8)
Alkaline Phosphatase: 102 IU/L (ref 48–121)
BUN/Creatinine Ratio: 25 (ref 12–28)
BUN: 18 mg/dL (ref 8–27)
Bilirubin Total: <0.2 mg/dL (ref 0.0–1.2)
CO2: 23 mmol/L (ref 20–29)
Calcium: 8.9 mg/dL (ref 8.7–10.3)
Chloride: 107 mmol/L — ABNORMAL HIGH (ref 96–106)
Creatinine, Ser: 0.73 mg/dL (ref 0.57–1.00)
GFR calc Af Amer: 99 mL/min/{1.73_m2} (ref >59)
GFR calc non Af Amer: 85 mL/min/{1.73_m2} (ref >59)
Globulin, Total: 2.7 g/dL (ref 1.5–4.5)
Glucose: 115 mg/dL — ABNORMAL HIGH (ref 65–99)
Potassium: 4 mmol/L (ref 3.5–5.2)
Sodium: 143 mmol/L (ref 134–144)
Total Protein: 6.6 g/dL (ref 6.0–8.5)

## 2019-12-03 LAB — C-REACTIVE PROTEIN: CRP: 10 mg/L (ref 0–10)

## 2019-12-03 LAB — ANA W/REFLEX IF POSITIVE: Anti Nuclear Antibody (ANA): NEGATIVE

## 2019-12-03 LAB — TSH: TSH: 1.65 u[IU]/mL (ref 0.450–4.500)

## 2019-12-03 LAB — VITAMIN D 25 HYDROXY (VIT D DEFICIENCY, FRACTURES): Vit D, 25-Hydroxy: 33.5 ng/mL (ref 30.0–100.0)

## 2019-12-03 LAB — SEDIMENTATION RATE: Sed Rate: 17 mm/hr (ref 0–40)

## 2019-12-03 LAB — RPR: RPR Ser Ql: NONREACTIVE

## 2019-12-03 LAB — HIV ANTIBODY (ROUTINE TESTING W REFLEX): HIV Screen 4th Generation wRfx: NONREACTIVE

## 2019-12-04 ENCOUNTER — Telehealth: Payer: Self-pay | Admitting: Neurology

## 2019-12-04 NOTE — Telephone Encounter (Signed)
I attempted to reach the pt. PT vm was not set up at the time of call and I could not leave a vm.  Will try again at a later time.

## 2019-12-04 NOTE — Telephone Encounter (Signed)
Please call patient, laboratory evaluation showed no significant abnormalities.

## 2019-12-04 NOTE — Telephone Encounter (Signed)
Pt returned my call and we reviewed results of labs She verbalized understanding and had no questions/concerns at this time.

## 2019-12-09 ENCOUNTER — Other Ambulatory Visit: Payer: Self-pay | Admitting: Family Medicine

## 2019-12-09 DIAGNOSIS — E119 Type 2 diabetes mellitus without complications: Secondary | ICD-10-CM

## 2019-12-12 DIAGNOSIS — G5603 Carpal tunnel syndrome, bilateral upper limbs: Secondary | ICD-10-CM | POA: Diagnosis not present

## 2019-12-17 ENCOUNTER — Telehealth: Payer: Self-pay | Admitting: *Deleted

## 2019-12-17 ENCOUNTER — Ambulatory Visit
Admission: RE | Admit: 2019-12-17 | Discharge: 2019-12-17 | Disposition: A | Discharge status: Home / Self Care | Payer: Medicare Other | Source: Ambulatory Visit | Attending: Neurology | Admitting: Neurology

## 2019-12-17 DIAGNOSIS — M76892 Other specified enthesopathies of left lower limb, excluding foot: Secondary | ICD-10-CM | POA: Diagnosis not present

## 2019-12-17 DIAGNOSIS — G8929 Other chronic pain: Secondary | ICD-10-CM

## 2019-12-17 DIAGNOSIS — M79652 Pain in left thigh: Secondary | ICD-10-CM

## 2019-12-17 DIAGNOSIS — M778 Other enthesopathies, not elsewhere classified: Secondary | ICD-10-CM | POA: Diagnosis not present

## 2019-12-17 DIAGNOSIS — M5442 Lumbago with sciatica, left side: Secondary | ICD-10-CM

## 2019-12-17 DIAGNOSIS — M1712 Unilateral primary osteoarthritis, left knee: Secondary | ICD-10-CM | POA: Diagnosis not present

## 2019-12-17 DIAGNOSIS — R200 Anesthesia of skin: Secondary | ICD-10-CM

## 2019-12-17 MED ORDER — IOPAMIDOL (ISOVUE-300) INJECTION 61%
100.0000 mL | Freq: Once | INTRAVENOUS | Status: AC | PRN
  Administered 2019-12-17: 100 mL via INTRAVENOUS

## 2019-12-17 NOTE — Telephone Encounter (Signed)
Pt left a VM returning Michelle's call. Please call back.

## 2019-12-17 NOTE — Telephone Encounter (Signed)
I spoke to the patient. She has pain in her thigh around her groin and knee. She would like to proceed with the orthopaedic referral for her left knee. She will continue taking Lyrica to see if helps with her symptoms. She has scheduled a follow up here on 03/11/20.

## 2019-12-17 NOTE — Telephone Encounter (Signed)
I spoke to the patient's niece, Baxter Flattery, on [place]. She will relay this message to the her aunt. She would like for the orthopaedic referral to be placed. This will be taken care of today. If she speaks to her aunt and finds the pain is higher on the thigh, then she will have the patient continue taking her prescribed medication and call our office for a follow up.

## 2019-12-17 NOTE — Telephone Encounter (Signed)
-----   Message from Britt Bottom, MD sent at 12/17/2019  9:47 AM EDT ----- Please let the patient know that the CT of the leg showed significant arthritis in the knee.  If the thigh pain is closer to the knee it could be related.  If this is the case, she can be referred to orthopedics for the knee pain.  If thigh pain is higher up, it is probably unrelated.

## 2019-12-17 NOTE — Addendum Note (Signed)
Addended by: Noberto Retort C on: 12/17/2019 10:16 AM   Modules accepted: Orders

## 2019-12-20 ENCOUNTER — Ambulatory Visit (INDEPENDENT_AMBULATORY_CARE_PROVIDER_SITE_OTHER): Payer: Medicare Other | Admitting: Orthotics

## 2019-12-20 ENCOUNTER — Other Ambulatory Visit: Payer: Self-pay

## 2019-12-20 ENCOUNTER — Ambulatory Visit: Payer: Medicare Other | Admitting: Neurology

## 2019-12-20 DIAGNOSIS — L84 Corns and callosities: Secondary | ICD-10-CM

## 2019-12-20 DIAGNOSIS — E1169 Type 2 diabetes mellitus with other specified complication: Secondary | ICD-10-CM

## 2019-12-20 DIAGNOSIS — M21619 Bunion of unspecified foot: Secondary | ICD-10-CM

## 2019-12-20 DIAGNOSIS — M21612 Bunion of left foot: Secondary | ICD-10-CM | POA: Diagnosis not present

## 2019-12-24 ENCOUNTER — Encounter: Payer: Self-pay | Admitting: Physician Assistant

## 2019-12-24 ENCOUNTER — Ambulatory Visit (INDEPENDENT_AMBULATORY_CARE_PROVIDER_SITE_OTHER): Payer: Medicare Other | Admitting: Physician Assistant

## 2019-12-24 DIAGNOSIS — M1712 Unilateral primary osteoarthritis, left knee: Secondary | ICD-10-CM | POA: Diagnosis not present

## 2019-12-24 DIAGNOSIS — M7062 Trochanteric bursitis, left hip: Secondary | ICD-10-CM

## 2019-12-24 MED ORDER — METHYLPREDNISOLONE ACETATE 40 MG/ML IJ SUSP
40.0000 mg | INTRAMUSCULAR | Status: AC | PRN
  Administered 2019-12-24: 40 mg via INTRA_ARTICULAR

## 2019-12-24 MED ORDER — LIDOCAINE HCL 1 % IJ SOLN
3.0000 mL | INTRAMUSCULAR | Status: AC | PRN
  Administered 2019-12-24: 3 mL

## 2019-12-24 NOTE — Progress Notes (Signed)
Office Visit Note   Patient: Amanda Morris           Date of Birth: 08-11-1950           MRN: [id number] Visit Date: 12/24/2019              Requested by: Britt Bottom, MD [address] [place],  [place] [address] PCP: Hali Marry, MD   Assessment & Plan: Visit Diagnoses:  1. Trochanteric bursitis, left hip   2. Primary osteoarthritis of left knee     Plan: She will monitor her glucose levels closely over the next few days.  She shown IT band stretching exercises for the left hip.  We will see her back in 2 weeks to see what type of response she had to the injection left hip she still complains of knee pain at that point time would recommend 2 views of the left knee to evaluate her arthritis and possibly at that time give her a intra-articular injection left knee.  Questions were encouraged and answered at length.  Follow-Up Instructions: Return in about 2 weeks (around 01/07/2020).   Orders:  No orders of the defined types were placed in this encounter.  No orders of the defined types were placed in this encounter.     Procedures: Large Joint Inj: L greater trochanter on 12/24/2019 9:05 AM Indications: pain Details: 22 G 1.5 in needle, lateral approach  Arthrogram: No  Medications: 3 mL lidocaine 1 %; 40 mg methylPREDNISolone acetate 40 MG/ML Outcome: tolerated well, no immediate complications Procedure, treatment alternatives, risks and benefits explained, specific risks discussed. Consent was given by the patient. Immediately prior to procedure a time out was called to verify the correct patient, procedure, equipment, support staff and site/side marked as required. Patient was prepped and draped in the usual sterile fashion.       Clinical Data: No additional findings.   Subjective: Chief Complaint  Patient presents with  . Left Leg - Pain    HPI Amanda Morris is a pleasant 69 year old female were seen for the first time for chronic left  leg pain.  She states she is having left hip left thigh and left knee pain.  She denies any groin pain.  Pain is mostly over the lateral aspect the left hip.  She does note that occasionally her left knee does give way and has some painful popping.  She has had no particular injury.  She denies any swelling of the knee left thigh.  No known injury. CT scan left femur dated 12/17/2019 is reviewed on epic and shows no acute findings.  Tricompartmental arthritic changes are noted in the left knee with severe narrowing in the medial compartment and also severe patellofemoral changes.  Review of Systems   Objective: Vital Signs: There were no vitals taken for this visit.  Physical Exam Constitutional:      Appearance: She is not ill-appearing.  Pulmonary:     Breath sounds: Normal breath sounds.  Neurological:     Mental Status: She is alert and oriented to person, place, and time.  Psychiatric:        Mood and Affect: Mood normal.     Ortho Exam Bilateral hips good range of motion.  She has some discomfort in the left hip with particularly external rotation.  Tenderness over left trochanteric region no tenderness over the right trochanteric region. Bilateral knees no abnormal warmth erythema or effusion.  No instability valgus varus stressing of either knee.  Patellofemoral crepitus bilateral knees with pain with range of motion.  Left knee tenderness along medial lateral joint line.  Specialty Comments:  No specialty comments available.  Imaging: No results found.   PMFS History: Patient Active Problem List   Diagnosis Date Noted  . Peripheral neuropathy 11/28/2019  . Paresthesia and pain of both upper extremities 11/12/2019  . Class 3 severe obesity due to excess calories with serious comorbidity and body mass index (BMI) of 40.0 to 44.9 in adult ([facility]) 11/09/2019  . Chronic midline low back pain with bilateral sciatica 10/30/2019  . Left thigh pain 10/30/2019  . Numbness  10/30/2019  . Meralgia paresthetica, left 09/12/2019  . BMI 40.0-44.9, adult ([facility]) 05/07/2019  . De Quervain's disease (tenosynovitis) 02/06/2019  . Dizzy 12/01/2018  . Acute bilateral low back pain without sciatica 12/01/2018  . Orthostatic hypotension 12/01/2018  . Failed back syndrome of lumbar spine 11/25/2017  . Fatty liver 09/27/2016  . Status post bilateral breast reduction 03/23/2016  . Spondylolisthesis of lumbar region 06/13/2015  . Spondylolisthesis at L4-L5 level 04/07/2015  . Radiculopathy, lumbar region 04/07/2015  . Essential hypertension 02/14/2015  . Postprandial vomiting 02/04/2014  . Obesity (BMI 30-39.9) 11/01/2013  . Carotid artery stenosis 06/07/2013  . Hereditary and idiopathic peripheral neuropathy 06/07/2013  . History of migraine 05/04/2013  . Type 2 diabetes mellitus with other specified complication ([facility]) 40/97/3532  . SVT (supraventricular tachycardia) ([facility]) 11/03/2010  . GERD (gastroesophageal reflux disease) 10/29/2010  . Chronic low back pain 10/29/2010  . Other and unspecified hyperlipidemia 10/29/2010   Past Medical History:  Diagnosis Date  . Allergy   . Diabetes mellitus without complication ([facility])   . GERD (gastroesophageal reflux disease)   . Headache    migraines last one 2 weeks ago  . History of hiatal hernia   . Hypercholesterolemia   . Numbness    left, outer thigh  . Palpitations    in the past, no current issues per patient  . Postlaminectomy syndrome     Family History  Problem Relation Age of Onset  . Heart disease Mother   . Diabetes Mother   . Hypertension Mother   . Stroke Mother   . Heart disease Father   . Heart disease Sister   . Diabetes Sister   . Hyperlipidemia Sister   . Hypertension Sister   . Stroke Sister   . Alcohol abuse Brother   . Hyperlipidemia Brother     Past Surgical History:  Procedure Laterality Date  . ABDOMINAL HYSTERECTOMY  1982  . BACK SURGERY    . BREAST REDUCTION SURGERY Bilateral  03/17/2016   Procedure: MAMMARY REDUCTION  (BREAST);  Surgeon: Wallace Going, DO;  Location: [facility];  Service: Plastics;  Laterality: Bilateral;  . SHOULDER SURGERY  06/2008, 09/2010   Right, by Dr. Karie Soda, then Dr. Judeth Horn  . SPINAL CORD STIMULATOR INSERTION N/A 12/02/2017   Procedure: LUMBAR SPINAL CORD STIMULATOR INSERTION;  Surgeon: Ashok Pall, MD;  Location: [facility];  Service: Neurosurgery;  Laterality: N/A;  . SPINAL CORD STIMULATOR TRIAL N/A 11/25/2017   Procedure: INSERTION LUMBAR SPINAL CORD STIMULATOR TRIAL  ;  Surgeon: Ashok Pall, MD;  Location: [facility];  Service: Neurosurgery;  Laterality: N/A;   INSERTION LUMBAR SPINAL CORD STIMULATOR TRIAL     Social History   Occupational History  . Occupation: Disabled.     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 Smoking status: Former Smoker    Types: Cigarettes  Quit date: 07/30/2007    Years since quitting: 12.4  . Smokeless tobacco: Never Used  Vaping Use  . Vaping Use: Never used  Substance and Sexual Activity  . Alcohol use: No  . Drug use: No  . Sexual activity: Never

## 2019-12-28 ENCOUNTER — Other Ambulatory Visit: Payer: Self-pay | Admitting: Family Medicine

## 2019-12-28 DIAGNOSIS — Z713 Dietary counseling and surveillance: Secondary | ICD-10-CM | POA: Diagnosis not present

## 2019-12-28 DIAGNOSIS — E119 Type 2 diabetes mellitus without complications: Secondary | ICD-10-CM

## 2020-01-04 DIAGNOSIS — Z1231 Encounter for screening mammogram for malignant neoplasm of breast: Secondary | ICD-10-CM | POA: Diagnosis not present

## 2020-01-04 LAB — HM MAMMOGRAPHY

## 2020-01-14 ENCOUNTER — Ambulatory Visit (INDEPENDENT_AMBULATORY_CARE_PROVIDER_SITE_OTHER): Payer: Medicare Other | Admitting: Physician Assistant

## 2020-01-14 ENCOUNTER — Encounter: Payer: Self-pay | Admitting: Physician Assistant

## 2020-01-14 ENCOUNTER — Ambulatory Visit: Payer: Self-pay

## 2020-01-14 DIAGNOSIS — M1712 Unilateral primary osteoarthritis, left knee: Secondary | ICD-10-CM | POA: Diagnosis not present

## 2020-01-14 DIAGNOSIS — G5712 Meralgia paresthetica, left lower limb: Secondary | ICD-10-CM

## 2020-01-14 DIAGNOSIS — M7062 Trochanteric bursitis, left hip: Secondary | ICD-10-CM

## 2020-01-14 NOTE — Progress Notes (Addendum)
Office Visit Note   Patient: Amanda Morris           Date of Birth: 02-09-1951           MRN: [id number] Visit Date: 01/14/2020              Requested by: Hali Marry, [address] [place],  [place] [address] PCP: Hali Marry, MD   Assessment & Plan: Visit Diagnoses:  1. Primary osteoarthritis of left knee   2. Trochanteric bursitis, left hip   3. Meralgia paresthetica, left     Plan: We will send her to formal physical therapy to work on stretching for her IT band.  A prescription was given to her for IT band stretching which she can do through her primary care physician's office in [place]. She will continue her Lyrica and Cymbalta.  Questions were encouraged and answered at length.  Patient was asked to keep notes on what type of relief of any she got up with physical therapy and if she is having any knee pain.  Prior visit she stated she was having knee pain she may benefit from cortisone injection in the left knee.  Follow-Up Instructions: Return in about 6 weeks (around 02/25/2020).   Orders:  Orders Placed This Encounter  Procedures  . XR Knee 1-2 Views Left   No orders of the defined types were placed in this encounter.     Procedures: No procedures performed   Clinical Data: No additional findings.   Subjective: Chief Complaint  Patient presents with  . Left Hip - Follow-up    HPI Amanda Morris returns today status post left hip injection for trochanteric bursitis 12/24/2019.  She states she has been doing IT band stretching . States the  injection and the stretching have given her  no relief.  She has shooting pain from her hip down to her knee.  She states she is having no pain in the knee though.  She is having no giving way sensation in the knee.  She has been diagnosed with in the past with a meralgia paresthetica and was seen neurologist for this.  EMG nerve conduction study showed mild axonal  sensorimotor polyneuropathy no evidence of the lumbar sacral radiculopathy.  She is currently taking Lyrica as Neurontin did not help.  She is also on Cymbalta is been on these for just over a month.  She states she has had no relief from the pain in her left thigh.  She states she can even feel her knee whenever pinching the area or injections in this area of her left thigh with insulin.  She notes that she did have hydrodissection for the meralgia paresthetica with Dr. Clovis Riley but states she got no relief with this. Review of Systems Denies any fevers chills shortness of breath chest pain  Objective: Vital Signs: There were no vitals taken for this visit. Checked being nice Physical Exam Constitutional:      Appearance: She is not ill-appearing or diaphoretic.  Pulmonary:     Effort: Pulmonary effort is normal.  Neurological:     Mental Status: She is alert and oriented to person, place, and time.  Psychiatric:        Mood and Affect: Mood normal.     Ortho Exam Tenderness over the left trochanteric region and down the IT band consistent with trochanteric bursitis.  Good range of motion of bilateral hips without pain.  Left knee good range  of motion without pain. Nontender with palpation along medial lateral joint line of the left knee.  No abnormal warmth erythema or effusion of the left knee.   Specialty Comments:  No specialty comments available.  Imaging: XR Knee 1-2 Views Left  Result Date: 01/14/2020 Left knee 2 views: Shows moderate medial compartmental narrowing.  Mild to moderate patellofemoral changes.  Lateral compartment appears well-preserved.  No acute fractures.  Knee is well located.  No bony abnormalities otherwise.    PMFS History: Patient Active Problem List   Diagnosis Date Noted  . Peripheral neuropathy 11/28/2019  . Paresthesia and pain of both upper extremities 11/12/2019  . Class 3 severe obesity due to excess calories with serious comorbidity and body  mass index (BMI) of 40.0 to 44.9 in adult ([place] [facility]) 11/09/2019  . Chronic midline low back pain with bilateral sciatica 10/30/2019  . Left thigh pain 10/30/2019  . Numbness 10/30/2019  . Meralgia paresthetica, left 09/12/2019  . BMI 40.0-44.9, adult ([place]) 05/07/2019  . De Quervain's disease (tenosynovitis) 02/06/2019  . Dizzy 12/01/2018  . Acute bilateral low back pain without sciatica 12/01/2018  . Orthostatic hypotension 12/01/2018  . Failed back syndrome of lumbar spine 11/25/2017  . Fatty liver 09/27/2016  . Status post bilateral breast reduction 03/23/2016  . Spondylolisthesis of lumbar region 06/13/2015  . Spondylolisthesis at L4-L5 level 04/07/2015  . Radiculopathy, lumbar region 04/07/2015  . Essential hypertension 02/14/2015  . Postprandial vomiting 02/04/2014  . Obesity (BMI 30-39.9) 11/01/2013  . Carotid artery stenosis 06/07/2013  . Hereditary and idiopathic peripheral neuropathy 06/07/2013  . History of migraine 05/04/2013  . Type 2 diabetes mellitus with other specified complication ([place] [facility]) 83/38/2505  . SVT (supraventricular tachycardia) ([place]) 11/03/2010  . GERD (gastroesophageal reflux disease) 10/29/2010  . Chronic low back pain 10/29/2010  . Other and unspecified hyperlipidemia 10/29/2010   Past Medical History:  Diagnosis Date  . Allergy   . Diabetes mellitus without complication ([facility])   . GERD (gastroesophageal reflux disease)   . Headache    migraines last one 2 weeks ago  . History of hiatal hernia   . Hypercholesterolemia   . Numbness    left, outer thigh  . Palpitations    in the past, no current issues per patient  . Postlaminectomy syndrome     Family History  Problem Relation Age of Onset  . Heart disease Mother   . Diabetes Mother   . Hypertension Mother   . Stroke Mother   . Heart disease Father   . Heart disease Sister   . Diabetes Sister   . Hyperlipidemia Sister   . Hypertension Sister   . Stroke Sister   . Alcohol abuse Brother   .  Hyperlipidemia Brother     Past Surgical History:  Procedure Laterality Date  . ABDOMINAL HYSTERECTOMY  1982  . BACK SURGERY    . BREAST REDUCTION SURGERY Bilateral 03/17/2016   Procedure: MAMMARY REDUCTION  (BREAST);  Surgeon: Wallace Going, DO;  Location: [facility];  Service: Plastics;  Laterality: Bilateral;  . SHOULDER SURGERY  06/2008, 09/2010   Right, by Dr. Karie Soda, then Dr. Judeth Horn  . SPINAL CORD STIMULATOR INSERTION N/A 12/02/2017   Procedure: LUMBAR SPINAL CORD STIMULATOR INSERTION;  Surgeon: Ashok Pall, MD;  Location: [facility];  Service: Neurosurgery;  Laterality: N/A;  . SPINAL CORD STIMULATOR TRIAL N/A 11/25/2017   Procedure: INSERTION LUMBAR SPINAL CORD STIMULATOR TRIAL  ;  Surgeon: Ashok Pall, MD;  Location: [facility];  Service:  Neurosurgery;  Laterality: N/A;   INSERTION LUMBAR SPINAL CORD STIMULATOR TRIAL     Social History   Occupational History  . Occupation: Disabled.     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 Smoking status: Former Smoker    Types: Cigarettes    Quit date: 07/30/2007    Years since quitting: 12.4  . Smokeless tobacco: Never Used  Vaping Use  . Vaping Use: Never used  Substance and Sexual Activity  . Alcohol use: No  . Drug use: No  . Sexual activity: Never

## 2020-01-15 ENCOUNTER — Telehealth: Payer: Self-pay | Admitting: Physician Assistant

## 2020-01-15 NOTE — Telephone Encounter (Signed)
Patient called. She would like a referral for PT sent to [facility].

## 2020-01-15 NOTE — Telephone Encounter (Signed)
Do you remember if he wanted PT on her, I can't tell?

## 2020-01-16 ENCOUNTER — Other Ambulatory Visit: Payer: Self-pay | Admitting: Radiology

## 2020-01-16 DIAGNOSIS — M7062 Trochanteric bursitis, left hip: Secondary | ICD-10-CM

## 2020-01-16 DIAGNOSIS — G5712 Meralgia paresthetica, left lower limb: Secondary | ICD-10-CM

## 2020-01-16 DIAGNOSIS — M1712 Unilateral primary osteoarthritis, left knee: Secondary | ICD-10-CM

## 2020-01-16 NOTE — Telephone Encounter (Signed)
Placed referral for [facility]

## 2020-01-18 ENCOUNTER — Ambulatory Visit: Payer: Medicare Other | Admitting: Podiatry

## 2020-01-21 NOTE — Progress Notes (Signed)

## 2020-01-31 ENCOUNTER — Other Ambulatory Visit: Payer: Self-pay

## 2020-01-31 ENCOUNTER — Ambulatory Visit (INDEPENDENT_AMBULATORY_CARE_PROVIDER_SITE_OTHER): Payer: Medicare Other | Admitting: Rehabilitative and Restorative Service Providers"

## 2020-01-31 ENCOUNTER — Encounter: Payer: Self-pay | Admitting: Rehabilitative and Restorative Service Providers"

## 2020-01-31 DIAGNOSIS — R2689 Other abnormalities of gait and mobility: Secondary | ICD-10-CM | POA: Diagnosis not present

## 2020-01-31 DIAGNOSIS — R531 Weakness: Secondary | ICD-10-CM

## 2020-01-31 DIAGNOSIS — R202 Paresthesia of skin: Secondary | ICD-10-CM
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
## 2020-01-31 NOTE — Patient Instructions (Signed)
Access Code: [email or web address]: [email or web address]: 10/07/2021Prepared by: Othar Curto HoltExercises  Prone Quadriceps Stretch with Strap - 2 x daily - 7 x weekly - 1 sets - 3 reps - 30 sec hold  Supine Piriformis Stretch with Leg Straight - 2 x daily - 7 x weekly - 1 sets - 3 reps - 30 sec hold  Bridge - 2 x daily - 7 x weekly - 1-2 sets - 10 reps - 5 sec hold  Supine ITB Stretch with Strap - 2 x daily - 7 x weekly - 1 sets - 3 reps - 30 sec hold  Sit to Stand - 2 x daily - 7 x weekly - 1 sets - 10 reps - 3-5 sec hold

## 2020-02-11 ENCOUNTER — Other Ambulatory Visit: Payer: Self-pay

## 2020-02-11 ENCOUNTER — Ambulatory Visit (INDEPENDENT_AMBULATORY_CARE_PROVIDER_SITE_OTHER): Payer: Medicare Other | Admitting: Family Medicine

## 2020-02-11 ENCOUNTER — Encounter: Payer: Self-pay | Admitting: Family Medicine

## 2020-02-11 VITALS — BP 122/79 | HR 70 | Ht 61.0 in | Wt 216.0 lb

## 2020-02-11 DIAGNOSIS — E1169 Type 2 diabetes mellitus with other specified complication: Secondary | ICD-10-CM | POA: Diagnosis not present

## 2020-02-11 DIAGNOSIS — Z6841 Body Mass Index (BMI) 40.0 and over, adult: Secondary | ICD-10-CM | POA: Diagnosis not present

## 2020-02-11 DIAGNOSIS — I10 Essential (primary) hypertension: Secondary | ICD-10-CM

## 2020-02-11 LAB — POCT GLYCOSYLATED HEMOGLOBIN (HGB A1C): Hemoglobin A1C: 6.4 % — AB (ref 4.0–5.6)

## 2020-02-11 LAB — POCT UA - MICROALBUMIN
Albumin/Creatinine Ratio, Urine, POC: <30
Creatinine, POC: 50 mg/dL
Microalbumin Ur, POC: 10 mg/L

## 2020-02-11 MED ORDER — LANTUS SOLOSTAR 100 UNIT/ML ~~LOC~~ SOPN: 10.0000 [IU] | PEN_INJECTOR | Freq: Every day | SUBCUTANEOUS | 0 refills | Status: DC

## 2020-02-11 MED ORDER — GLIPIZIDE 10 MG PO TABS: 10.0000 mg | ORAL_TABLET | Freq: Two times a day (BID) | ORAL | 1 refills | Status: DC

## 2020-02-11 NOTE — Assessment & Plan Note (Signed)
Well controlled. Continue current regimen. Follow up in  3-4 months.  

## 2020-02-11 NOTE — Progress Notes (Signed)
Established Patient Office Visit  Subjective:  Patient ID: Amanda Morris, female    DOB: 1951-02-01  Age: 69 y.o. MRN: [id number]  CC:  Chief Complaint  Patient presents with  . Diabetes    HPI Amanda Morris presents for   Diabetes - no hypoglycemic events. No wounds or sores that are not healing well. No increased thirst or urination. Checking glucose at home. Taking medications as prescribed without any side effects.  She says since the end of September she is actually been holding her insulin she has been having some low blood sugars she said she has been able to go sometimes 2 and 3 weeks without having to use her insulin and then when she has given it occasionally she decrease it to 28 units.  She is really changed how she has been eating.  She still not eating a lot of vegetables and she still try to work on getting more active and feels like that is probably helped with her blood sugars as well.  She is down to 216 pounds.  Hypertension- Pt denies chest pain, SOB, dizziness, or heart palpitations.  Taking meds as directed w/o problems.  Denies medication side effects.      Past Medical History:  Diagnosis Date  . Allergy   . Diabetes mellitus without complication ([place])   . GERD (gastroesophageal reflux disease)   . Headache    migraines last one 2 weeks ago  . History of hiatal hernia   . Hypercholesterolemia   . Numbness    left, outer thigh  . Palpitations    in the past, no current issues per patient  . Postlaminectomy syndrome     Past Surgical History:  Procedure Laterality Date  . ABDOMINAL HYSTERECTOMY  1982  . BACK SURGERY    . BREAST REDUCTION SURGERY Bilateral 03/17/2016   Procedure: MAMMARY REDUCTION  (BREAST);  Surgeon: Wallace Going, DO;  Location: [facility];  Service: Plastics;  Laterality: Bilateral;  . SHOULDER SURGERY  06/2008, 09/2010   Right, by Dr. Karie Soda, then Dr. Judeth Horn  . SPINAL CORD STIMULATOR  INSERTION N/A 12/02/2017   Procedure: LUMBAR SPINAL CORD STIMULATOR INSERTION;  Surgeon: Ashok Pall, MD;  Location: [facility];  Service: Neurosurgery;  Laterality: N/A;  . SPINAL CORD STIMULATOR TRIAL N/A 11/25/2017   Procedure: INSERTION LUMBAR SPINAL CORD STIMULATOR TRIAL  ;  Surgeon: Ashok Pall, MD;  Location: [facility];  Service: Neurosurgery;  Laterality: N/A;   INSERTION LUMBAR SPINAL CORD STIMULATOR TRIAL      Family History  Problem Relation Age of Onset  . Heart disease Mother   . Diabetes Mother   . Hypertension Mother   . Stroke Mother   . Heart disease Father   . Heart disease Sister   . Diabetes Sister   . Hyperlipidemia Sister   . Hypertension Sister   . Stroke Sister   . Alcohol abuse Brother   . Hyperlipidemia Brother     Social History   Socioeconomic History  . Marital status: Divorced    Spouse name: Not on file  . Number of children: 1  . Years of education: 9th grade  . Highest education level: 9th grade  Occupational History  . Occupation: Disabled.     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 Smoking status: Former Smoker    Types: Cigarettes    Quit date: 07/30/2007    Years since quitting: 12.5  . Smokeless tobacco: Never Used  Vaping Use  . Vaping Use: Never used  Substance and Sexual Activity  . Alcohol use: No  . Drug use: No  . Sexual activity: Never  Other Topics Concern  . Not on file  Social History Narrative   Lives alone.   Right-handed.   No daily caffeine use.   Social Determinants of Health   Financial Resource Strain:   . Difficulty of Paying Living Expenses: Not on file  Food Insecurity:   . Worried About Charity fundraiser in the Last Year: Not on file  . Ran Out of Food in the Last Year: Not on file  Transportation Needs:   . Lack of Transportation (Medical): Not on file  . Lack of Transportation (Non-Medical): Not on file  Physical Activity:   . Days of Exercise per Week: Not on file  . Minutes of  Exercise per Session: Not on file  Stress:   . Feeling of Stress : Not on file  Social Connections:   . Frequency of Communication with Friends and Family: Not on file  . Frequency of Social Gatherings with Friends and Family: Not on file  . Attends Religious Services: Not on file  . Active Member of Clubs or Organizations: Not on file  . Attends Archivist Meetings: Not on file  . Marital Status: Not on file  Intimate Partner Violence:   . Fear of Current or Ex-Partner: Not on file  . Emotionally Abused: Not on file  . Physically Abused: Not on file  . Sexually Abused: Not on file    Outpatient Medications Prior to Visit  Medication Sig Dispense Refill  . ACCU-CHEK AVIVA PLUS test strip FOR TESTING BLOOD SUGARS 3 TIMES DAILY. DX:E11.9 300 strip 12  . Accu-Chek Softclix Lancets lancets Dx:E11.9 100 each PRN  . aspirin EC 81 MG tablet Take 1 tablet (81 mg total) by mouth daily.    . B-D ULTRAFINE III SHORT PEN 31G X 8 MM MISC USE AS DIRECTED 100 each 6  . blood glucose meter kit and supplies KIT Dispense glucometer, test strips, and lancets. Use to check blood sugar twice daily. Dx: E11.9 1 each 0  . cholecalciferol (VITAMIN D) 1000 units tablet Take 1,000 Units by mouth daily.    . ciclopirox (PENLAC) 8 % solution Apply topically at bedtime. Apply over nail and surrounding skin. Apply daily over previous coat. After seven (7) days, may remove with alcohol and continue cycle. 6.6 mL 4  . DEXILANT 60 MG capsule Take 60 mg by mouth daily.   3  . diclofenac Sodium (VOLTAREN) 1 % GEL Apply 1 application topically 4 (four) times daily.    Marland Kitchen dicyclomine (BENTYL) 20 MG tablet Take 20 mg by mouth 4 (four) times daily.    Marland Kitchen doxepin (SINEQUAN) 10 MG capsule Take 10 mg by mouth at bedtime.    . famotidine (PEPCID) 20 MG tablet     . fluticasone (FLONASE) 50 MCG/ACT nasal spray PLACE 1 SPRAY INTO BOTH NOSTRILS DAILY. 48 mL 4  . JARDIANCE 25 MG TABS tablet TAKE 25 MG BY MOUTH DAILY. 90  tablet 1  . levocetirizine (XYZAL) 5 MG tablet TAKE 1 TABLET BY MOUTH EVERY DAY IN THE EVENING 90 tablet 3  . Multiple Vitamins-Minerals (MULTIVITAMIN WOMEN 50+ PO) Take 1 capsule by mouth daily.    . naproxen sodium (ALEVE) 220 MG tablet Take 220 mg by mouth daily as needed.    . Omega-3 1000 MG CAPS Take 1 capsule by  mouth daily.    Marland Kitchen perphenazine (TRILAFON) 8 MG tablet TAKE ONE TABLET (8 MG TOTAL) BY MOUTH AT BEDTIME.  2  . pravastatin (PRAVACHOL) 40 MG tablet TAKE 1 TABLET BY MOUTH EVERYDAY AT BEDTIME 90 tablet 3  . pregabalin (LYRICA) 200 MG capsule Take 1 capsule (200 mg total) by mouth 3 (three) times daily. 90 capsule 5  . glipiZIDE (GLUCOTROL) 10 MG tablet TAKE 1 TABLET BY MOUTH TWICE A DAY 180 tablet 1  . insulin glargine (LANTUS SOLOSTAR) 100 UNIT/ML Solostar Pen Inject 38 Units into the skin at bedtime. 15 mL 5   No facility-administered medications prior to visit.    Allergies  Allergen Reactions  . Penicillins Hives and Other (See Comments)    PATIENT HAS HAD A PCN REACTION WITH IMMEDIATE RASH, FACIAL/TONGUE/THROAT SWELLING, SOB, OR LIGHTHEADEDNESS WITH HYPOTENSION:  #  #  YES  #  #  Has patient had a PCN reaction causing severe rash involving mucus membranes or skin necrosis: No Has patient had a PCN reaction that required hospitalization: No Has patient had a PCN reaction occurring within the last 10 years: No If all of the above answers are "NO", then may proceed with Cephalosporin use.   . Duloxetine Other (See Comments)    GI upset  . Aspirin Nausea Only  . Codeine Itching  . Hydrocodone Nausea Only  . Metformin And Related Diarrhea and Nausea Only  . Oxycodone Nausea And Vomiting    Other reaction(s): GI Upset (intolerance)  . Victoza [Liraglutide] Other (See Comments)    Abdominal pain    ROS Review of Systems    Objective:    Physical Exam Constitutional:      Appearance: She is well-developed.  HENT:     Head: Normocephalic and atraumatic.   Cardiovascular:     Rate and Rhythm: Normal rate and regular rhythm.     Heart sounds: Normal heart sounds.  Pulmonary:     Effort: Pulmonary effort is normal.     Breath sounds: Normal breath sounds.  Skin:    General: Skin is warm and dry.  Neurological:     Mental Status: She is alert and oriented to person, place, and time.  Psychiatric:        Behavior: Behavior normal.     BP 122/79   Pulse 70   Ht _0  (1.549 m)   Wt 216 lb (98 kg)   SpO2 100%   BMI 40.81 kg/m  Wt Readings from Last 3 Encounters:  02/11/20 216 lb (98 kg)  11/12/19 223 lb (101.2 kg)  11/09/19 226 lb (102.5 kg)     There are no preventive care reminders to display for this patient.  There are no preventive care reminders to display for this patient.  Lab Results  Component Value Date   TSH 1.650 11/28/2019   Lab Results  Component Value Date   WBC 7.7 11/28/2019   HGB 12.6 11/28/2019   HCT 41.5 11/28/2019   MCV 77 (L) 11/28/2019   PLT 273 11/21/2017   Lab Results  Component Value Date   NA 143 11/28/2019   K 4.0 11/28/2019   CO2 23 11/28/2019   GLUCOSE 115 (H) 11/28/2019   BUN 18 11/28/2019   CREATININE 0.73 11/28/2019   BILITOT <0.2 11/28/2019   ALKPHOS 102 11/28/2019   AST 19 11/28/2019   ALT 15 11/28/2019   PROT 6.6 11/28/2019   ALBUMIN 3.9 11/28/2019   CALCIUM 8.9 11/28/2019   ANIONGAP 12 11/21/2017  Lab Results  Component Value Date   CHOL 151 02/05/2019   Lab Results  Component Value Date   HDL 37 (L) 02/05/2019   Lab Results  Component Value Date   LDLCALC 90 02/05/2019   Lab Results  Component Value Date   TRIG 139 02/05/2019   Lab Results  Component Value Date   CHOLHDL 4.1 02/05/2019   Lab Results  Component Value Date   HGBA1C 6.4 (A) 02/11/2020      Assessment & Plan:   Problem List Items Addressed This Visit      Cardiovascular and Mediastinum   Essential hypertension    Well controlled. Continue current regimen. Follow up in  3-4  months      Relevant Medications   glipiZIDE (GLUCOTROL) 10 MG tablet     Endocrine   Type 2 diabetes mellitus with other specified complication ([place]) - Primary    She has done fantastic with weight loss and dietary changes.  Her A1c is down to 6.6.  We discussed continuing to hold her insulin completely and if she does need to give it occasionally I would recommend she only give 15 units instead of 28.  Follow-up in 3 to 4 months.      Relevant Medications   glipiZIDE (GLUCOTROL) 10 MG tablet   insulin glargine (LANTUS SOLOSTAR) 100 UNIT/ML Solostar Pen   Other Relevant Orders   POCT glycosylated hemoglobin (Hb A1C) (Completed)   POCT UA - Microalbumin (Completed)     Other   Class 3 severe obesity due to excess calories with serious comorbidity and body mass index (BMI) of 40.0 to 44.9 in adult [facility])   Relevant Medications   glipiZIDE (GLUCOTROL) 10 MG tablet   insulin glargine (LANTUS SOLOSTAR) 100 UNIT/ML Solostar Pen      Meds ordered this encounter  Medications  . glipiZIDE (GLUCOTROL) 10 MG tablet    Sig: Take 1 tablet (10 mg total) by mouth 2 (two) times daily.    Dispense:  180 tablet    Refill:  1    DX: E11.69  . insulin glargine (LANTUS SOLOSTAR) 100 UNIT/ML Solostar Pen    Sig: Inject 10-20 Units into the skin at bedtime. PRN.    Dispense:  3 mL    Refill:  0    Follow-up: Return in about 3 months (around 05/20/2020) for Diabetes follow-up.    Beatrice Lecher, MD

## 2020-02-11 NOTE — Assessment & Plan Note (Signed)
She has done fantastic with weight loss and dietary changes.  Her A1c is down to 6.6.  We discussed continuing to hold her insulin completely and if she does need to give it occasionally I would recommend she only give 15 units instead of 28.  Follow-up in 3 to 4 months.

## 2020-02-13 ENCOUNTER — Other Ambulatory Visit: Payer: Self-pay

## 2020-02-13 ENCOUNTER — Ambulatory Visit (INDEPENDENT_AMBULATORY_CARE_PROVIDER_SITE_OTHER): Payer: Medicare Other | Admitting: Physical Therapy

## 2020-02-13 ENCOUNTER — Encounter: Payer: Self-pay | Admitting: Physical Therapy

## 2020-02-13 DIAGNOSIS — R531 Weakness: Secondary | ICD-10-CM

## 2020-02-13 DIAGNOSIS — R2689 Other abnormalities of gait and mobility: Secondary | ICD-10-CM

## 2020-02-13 DIAGNOSIS — R202 Paresthesia of skin: Secondary | ICD-10-CM
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
## 2020-02-13 NOTE — Patient Instructions (Signed)
Access Code: [email or web address]: [email or web address]: 10/20/2021Prepared by: [facility]ernersvilleExercises  Prone Quadriceps Stretch with Strap - 2 x daily - 7 x weekly - 1 sets - 3 reps - 30 sec hold  Supine Piriformis Stretch with Leg Straight - 2 x daily - 7 x weekly - 1 sets - 3 reps - 30 sec hold  Bridge - 2 x daily - 7 x weekly - 1-2 sets - 10 reps - 5 sec hold  Supine ITB Stretch with Strap - 2 x daily - 7 x weekly - 1 sets - 3 reps - 30 sec hold  Sit to Stand - 2 x daily - 7 x weekly - 1 sets - 10 reps - 3-5 sec hold  Standing Single Leg Stance with Counter Support - 1 x daily - 7 x weekly - 1 sets - 2-3 reps - 5-10 seconds hold  Tandem Stance with Support - 1 x daily - 7 x weekly - 1 sets - 2-3 reps - 20 sec hold  Supine Figure 4 Piriformis Stretch - 1 x daily - 7 x weekly - 1 sets - 2 reps - 20 seconds hold

## 2020-02-18 ENCOUNTER — Other Ambulatory Visit: Payer: Self-pay

## 2020-02-18 ENCOUNTER — Encounter: Payer: Self-pay | Admitting: Rehabilitative and Restorative Service Providers"

## 2020-02-18 ENCOUNTER — Ambulatory Visit (INDEPENDENT_AMBULATORY_CARE_PROVIDER_SITE_OTHER): Payer: Medicare Other | Admitting: Rehabilitative and Restorative Service Providers"

## 2020-02-18 ENCOUNTER — Other Ambulatory Visit: Payer: Self-pay | Admitting: Family Medicine

## 2020-02-18 DIAGNOSIS — R2689 Other abnormalities of gait and mobility: Secondary | ICD-10-CM

## 2020-02-18 DIAGNOSIS — R531 Weakness: Secondary | ICD-10-CM

## 2020-02-18 DIAGNOSIS — R202 Paresthesia of skin: Secondary | ICD-10-CM

## 2020-02-18 DIAGNOSIS — E1169 Type 2 diabetes mellitus with other specified complication: Secondary | ICD-10-CM

## 2020-02-18 NOTE — Therapy (Signed)
[place], [place], [address] Phone: [phone]   Fax:  [phone]  Physical Therapy Treatment  Patient Details  Name: Amanda Morris MRN: [id number] Date of Birth: 1951-02-20 Referring Provider (PT): Erskine Emery, PA-C    Encounter Date: 02/18/2020   PT End of Session - 02/18/20 1525    Visit Number 3    Number of Visits 12    Date for PT Re-Evaluation 03/13/20    PT Start Time 3790    PT Stop Time 2409    PT Time Calculation (min) 49 min    Activity Tolerance Patient tolerated treatment well           Past Medical History:  Diagnosis Date  . Allergy   . Diabetes mellitus without complication ([place])   . GERD (gastroesophageal reflux disease)   . Headache    migraines last one 2 weeks ago  . History of hiatal hernia   . Hypercholesterolemia   . Numbness    left, outer thigh  . Palpitations    in the past, no current issues per patient  . Postlaminectomy syndrome     Past Surgical History:  Procedure Laterality Date  . ABDOMINAL HYSTERECTOMY  1982  . BACK SURGERY    . BREAST REDUCTION SURGERY Bilateral 03/17/2016   Procedure: MAMMARY REDUCTION  (BREAST);  Surgeon: Wallace Going, DO;  Location: [facility];  Service: Plastics;  Laterality: Bilateral;  . SHOULDER SURGERY  06/2008, 09/2010   Right, by Dr. Karie Soda, then Dr. Judeth Horn  . SPINAL CORD STIMULATOR INSERTION N/A 12/02/2017   Procedure: LUMBAR SPINAL CORD STIMULATOR INSERTION;  Surgeon: Ashok Pall, MD;  Location: [facility];  Service: Neurosurgery;  Laterality: N/A;  . SPINAL CORD STIMULATOR TRIAL N/A 11/25/2017   Procedure: INSERTION LUMBAR SPINAL CORD STIMULATOR TRIAL  ;  Surgeon: Ashok Pall, MD;  Location: [facility];  Service: Neurosurgery;  Laterality: N/A;   INSERTION LUMBAR SPINAL CORD STIMULATOR TRIAL      There were no vitals filed for this visit.   Subjective Assessment - 02/18/20 1525     Subjective Taking some pills that help with the pain. Also going to the gym 2 times a week. Feels that the exercise program at the gym is helping. Working on the stepper; bicycle; lat pull down(we think); seated row; leg press. Increased Lt lateral thigh pain post exercise today.    Currently in Pain? No/denies    Pain Score 0-No pain              [facility] PT Assessment - 02/18/20 0001      Assessment   Medical Diagnosis Lt LE radiculopathy     Referring Provider (PT) Erskine Emery, PA-C     Onset Date/Surgical Date 01/25/19   injury ~ 30 + yrs ago, worse in the past year    Hand Dominance Right    Next MD Visit 11/21    Prior Therapy yes here       Strength   Right Hip Flexion 5/5    Right Hip Extension 4+/5    Right Hip ABduction 4+/5    Right Hip ADduction 5/5    Left Hip Flexion 4+/5    Left Hip Extension 4/5    Left Hip ABduction 4/5    Left Hip ADduction 4+/5                         [facility] Adult  PT Treatment/Exercise - 02/18/20 0001      Knee/Hip Exercises: Stretches   Passive Hamstring Stretch Left;2 reps;20 seconds    Quad Stretch Left;30 seconds;2 reps    ITB Stretch Left;2 reps;30 seconds    Piriformis Stretch Left;2 reps;30 seconds   supine travell      Knee/Hip Exercises: Aerobic   Nustep L5 x 5 min        Knee/Hip Exercises: Machines for Strengthening   Cybex Leg Press 6 plates x 10     Other Machine heel raise 6 plates x 10 reps       Knee/Hip Exercises: Standing   Wall Squat 5 reps   5 sec hold      Knee/Hip Exercises: Seated   Sit to Sand without UE support;10 reps   hinged hip cues for upright posture; dowel along spine      Moist Heat Therapy   Number Minutes Moist Heat 8 Minutes    Moist Heat Location Lumbar Spine;Hip   lateral Lt thigh                  PT Education - 02/18/20 1551    Education Details HEP    Person(s) Educated Patient    Methods Explanation;Demonstration;Tactile cues;Verbal cues;Handout     Comprehension Verbalized understanding;Returned demonstration;Verbal cues required;Tactile cues required               PT Long Term Goals - 01/31/20 1656      PT LONG TERM GOAL #1   Title Improve posture and alignment with patient to engage core and improve upright posture    Time 6    Period Weeks    Status New    Target Date 03/13/20      PT LONG TERM GOAL #2   Title Increase Lt LE pstrength to 4/5 to 4+/5 throughout    Time 6    Period Weeks    Status New    Target Date 03/13/20      PT LONG TERM GOAL #3   Title Improve gait pattern with patient to demonstrate safe normal gait pattern and report decreased fear of falling    Time 6    Period Weeks    Status New    Target Date 03/13/20      PT LONG TERM GOAL #4   Title Independent in HEP    Time 6    Period Weeks    Status New    Target Date 03/13/20      PT LONG TERM GOAL #5   Title Improve FOTO to </= 67% limitation    Time 6    Period Weeks    Status New    Target Date 03/13/20                 Plan - 02/18/20 1539    Clinical Impression Statement Patient reports that medication has taken care of the pain in the Lt LE. Pt did experience increased pain following LE resistive exercises today. Patient is going to the gym ~ 2 times a week and feels that the gym program will help her gain strength. Worked today to make recommendations for the gym and started cybex resistive exercises in the clinic as well as functional LE strengthening. Patient had increased pain post exercise. Will monitor and progress resistive exercise/strengthening program as indicated. Patient demonstrates good gains in LE strength with resistive testing. Will work towards independent gym program.    Rehab Potential Good  PT Frequency 2x / week    PT Duration 6 weeks    PT Treatment/Interventions ADLs/Self Care Home Management;Aquatic Therapy;Cryotherapy;Electrical Stimulation;Iontophoresis 4mg /ml Dexamethasone;Moist  Heat;Ultrasound;Gait training;Stair training;Functional mobility training;Therapeutic activities;Therapeutic exercise;Balance training;Neuromuscular re-education;Patient/family education;Manual techniques;Dry needling;Taping    PT Next Visit Plan review HEP; progress with core stabilization and Lt LE strengthening; further assessment of sensory changes, balance and gait as indicated; work toward transitiion of strengthening program to [employer]    PT [place]           Patient will benefit from skilled therapeutic intervention in order to improve the following deficits and impairments:     Visit Diagnosis: Weakness generalized  Paresthesia of left leg  Other abnormalities of gait and mobility     Problem List Patient Active Problem List   Diagnosis Date Noted  . Peripheral neuropathy 11/28/2019  . Paresthesia and pain of both upper extremities 11/12/2019  . Class 3 severe obesity due to excess calories with serious comorbidity and body mass index (BMI) of 40.0 to 44.9 in adult ([place]) 11/09/2019  . Chronic midline low back pain with bilateral sciatica 10/30/2019  . Left thigh pain 10/30/2019  . Numbness 10/30/2019  . Meralgia paresthetica, left 09/12/2019  . De Quervain's disease (tenosynovitis) 02/06/2019  . Dizzy 12/01/2018  . Acute bilateral low back pain without sciatica 12/01/2018  . Orthostatic hypotension 12/01/2018  . Failed back syndrome of lumbar spine 11/25/2017  . Fatty liver 09/27/2016  . Status post bilateral breast reduction 03/23/2016  . Spondylolisthesis of lumbar region 06/13/2015  . Spondylolisthesis at L4-L5 level 04/07/2015  . Radiculopathy, lumbar region 04/07/2015  . Essential hypertension 02/14/2015  . Postprandial vomiting 02/04/2014  . Obesity (BMI 30-39.9) 11/01/2013  . Carotid artery stenosis 06/07/2013  . Hereditary and idiopathic peripheral neuropathy 06/07/2013  . History of migraine 05/04/2013  . Type 2 diabetes  mellitus with other specified complication ([place]) 88/89/1694  . SVT (supraventricular tachycardia) ([place]) 11/03/2010  . GERD (gastroesophageal reflux disease) 10/29/2010  . Chronic low back pain 10/29/2010  . Other and unspecified hyperlipidemia 10/29/2010    Lamoine Fredricksen Nilda Simmer PT, MPH  02/18/2020, 4:32 PM  [facility] [place], [place], [address] Phone: [phone]   Fax:  [phone]  Name: Amanda Morris MRN: [id number] Date of Birth: July 20, 1950

## 2020-02-18 NOTE — Patient Instructions (Signed)
Access Code: [email or web address]: [email or web address]: 10/25/2021Prepared by: Gusta Marksberry HoltExercises  Prone Quadriceps Stretch with Strap - 2 x daily - 7 x weekly - 1 sets - 3 reps - 30 sec hold  Supine Piriformis Stretch with Leg Straight - 2 x daily - 7 x weekly - 1 sets - 3 reps - 30 sec hold  Bridge - 2 x daily - 7 x weekly - 1-2 sets - 10 reps - 5 sec hold  Supine ITB Stretch with Strap - 2 x daily - 7 x weekly - 1 sets - 3 reps - 30 sec hold  Standing Single Leg Stance with Counter Support - 1 x daily - 7 x weekly - 1 sets - 2-3 reps - 5-10 seconds hold  Tandem Stance with Support - 1 x daily - 7 x weekly - 1 sets - 2-3 reps - 20 sec hold  Supine Figure 4 Piriformis Stretch - 1 x daily - 7 x weekly - 1 sets - 2 reps - 20 seconds hold  Sit to Stand - 2 x daily - 7 x weekly - 2 sets - 10 reps - 3-5 sec hold  Wall Quarter Squat - 2 x daily - 7 x weekly - 1-2 sets - 10 reps - 5-10 sec hold

## 2020-02-21 ENCOUNTER — Other Ambulatory Visit: Payer: Self-pay

## 2020-02-21 ENCOUNTER — Ambulatory Visit (INDEPENDENT_AMBULATORY_CARE_PROVIDER_SITE_OTHER): Payer: Medicare Other | Admitting: Physical Therapy

## 2020-02-21 DIAGNOSIS — R202 Paresthesia of skin: Secondary | ICD-10-CM

## 2020-02-21 DIAGNOSIS — R531 Weakness: Secondary | ICD-10-CM | POA: Diagnosis not present

## 2020-02-21 DIAGNOSIS — R2689 Other abnormalities of gait and mobility: Secondary | ICD-10-CM
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
## 2020-02-22 ENCOUNTER — Ambulatory Visit (INDEPENDENT_AMBULATORY_CARE_PROVIDER_SITE_OTHER): Payer: Medicare Other | Admitting: Podiatry

## 2020-02-22 DIAGNOSIS — E1169 Type 2 diabetes mellitus with other specified complication: Secondary | ICD-10-CM

## 2020-02-22 DIAGNOSIS — B351 Tinea unguium: Secondary | ICD-10-CM | POA: Diagnosis not present

## 2020-02-22 DIAGNOSIS — M79675 Pain in left toe(s): Secondary | ICD-10-CM

## 2020-02-22 DIAGNOSIS — M79674 Pain in right toe(s): Secondary | ICD-10-CM | POA: Diagnosis not present

## 2020-02-22 DIAGNOSIS — L84 Corns and callosities: Secondary | ICD-10-CM | POA: Diagnosis not present

## 2020-02-22 NOTE — Progress Notes (Signed)
Subjective: 69 y.o. returns the office today for painful, elongated, thickened toenails which she cannot trim herself as well as calluses.  She reports her right second toenail causes discomfort particular with shoe gear.  She denies any redness or drainage or any swelling to the toenail callus sites.  Her last A1c was 6.4 in October 18.  Denies any fevers, chills, nausea, vomiting.  No other concerns today.   PCP: Hali Marry, MD Last Seen: 11/12/2019   Objective: AAO 3, NAD DP/PT pulses palpable, CRT less than 3 seconds Nails hypertrophic, dystrophic, elongated, brittle, discolored 10. There is tenderness overlying the nails 1-5 bilaterally.  Particular right second toenail is causing discomfort as its more thickened compared to the other nails also the second toe is elongated. Hyperkeratotic tissue bilateral hallux and submetatarsal 1 and left plantar heel.  No underlying ulceration drainage or signs of infection noted today. No other areas of tenderness bilateral lower extremities. No overlying edema, erythema, increased warmth. No pain with calf compression, swelling, warmth, erythema.  Assessment: Patient presents with symptomatic onychomycosis; hyperkeratotic lesions  Plan: -Treatment options including alternatives, risks, complications were discussed -Nails sharply debrided 10 without complication/bleeding.  Prescribed Penlac and directed on use, side effects and success rates. -Hyperkeratotic lesion sharply debrided x4 without complications or bleeding. -I discussed toenail removal of the right second toe which was to hold off on this.  Dispensed offloading pads.  Consider nail removal in the future if needed. -Discussed daily foot inspection. If there are any changes, to call the office immediately.  -Follow-up in 3 months or sooner if any problems are to arise. In the meantime, encouraged to call the office with any questions, concerns, changes symptoms.  Celesta Gentile, DPM

## 2020-02-25 ENCOUNTER — Encounter: Payer: Self-pay | Admitting: Physician Assistant

## 2020-02-25 ENCOUNTER — Other Ambulatory Visit: Payer: Self-pay

## 2020-02-25 ENCOUNTER — Ambulatory Visit (INDEPENDENT_AMBULATORY_CARE_PROVIDER_SITE_OTHER): Payer: Medicare Other | Admitting: Physician Assistant

## 2020-02-25 ENCOUNTER — Ambulatory Visit (INDEPENDENT_AMBULATORY_CARE_PROVIDER_SITE_OTHER): Payer: Medicare Other | Admitting: Physical Therapy

## 2020-02-25 DIAGNOSIS — R202 Paresthesia of skin: Secondary | ICD-10-CM | POA: Diagnosis not present

## 2020-02-25 DIAGNOSIS — R531 Weakness: Secondary | ICD-10-CM

## 2020-02-25 DIAGNOSIS — G5712 Meralgia paresthetica, left lower limb: Secondary | ICD-10-CM

## 2020-02-25 DIAGNOSIS — M1712 Unilateral primary osteoarthritis, left knee: Secondary | ICD-10-CM | POA: Diagnosis not present

## 2020-02-25 DIAGNOSIS — R2689 Other abnormalities of gait and mobility: Secondary | ICD-10-CM | POA: Diagnosis not present
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
## 2020-02-25 NOTE — Progress Notes (Signed)
  HPI: Ms. Santacroce returns today follow-up of her left hip thigh and knee pain.  She states that between therapy and the medication change which she is unsure what the medication was changed to she is not having any real pain in the thigh or the hip.  She notes she still has numbness in the thigh.  She denies any mechanical symptoms of the left knee or pain in the left knee.  Review of systems: See HPI otherwise negative or noncontributory.  Physical exam: General well-developed well-nourished female no acute distress mood and affect appropriate.  Ambulates without any assistive device in a nonantalgic gait.  Left hip: Excellent range of motion without pain.  Left knee: Full range of motion slight crepitus with passive range of motion.  Slight hyperextension.  No instability valgus varus stressing.  No abnormal warmth erythema or effusion.  Impression: Left knee osteoarthritis Meralgia  Paresthetica left  Plan: Recommend that she follow-up with [place] as needed.  If she develops any mechanical symptoms or pain in the knee would recommend cortisone injection in the knee.  Discussed quad strengthening exercises with her.  Questions were encouraged and answered

## 2020-02-27 ENCOUNTER — Encounter: Payer: Self-pay | Admitting: Physical Therapy

## 2020-02-27 ENCOUNTER — Other Ambulatory Visit: Payer: Self-pay

## 2020-02-27 ENCOUNTER — Ambulatory Visit (INDEPENDENT_AMBULATORY_CARE_PROVIDER_SITE_OTHER): Payer: Medicare Other | Admitting: Physical Therapy

## 2020-02-27 DIAGNOSIS — R531 Weakness: Secondary | ICD-10-CM | POA: Diagnosis not present

## 2020-02-27 DIAGNOSIS — R202 Paresthesia of skin: Secondary | ICD-10-CM

## 2020-02-27 DIAGNOSIS — R2689 Other abnormalities of gait and mobility: Secondary | ICD-10-CM | POA: Diagnosis not present
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
## 2020-03-03 ENCOUNTER — Other Ambulatory Visit: Payer: Self-pay

## 2020-03-03 ENCOUNTER — Encounter: Payer: Self-pay | Admitting: Sports Medicine

## 2020-03-03 ENCOUNTER — Ambulatory Visit (INDEPENDENT_AMBULATORY_CARE_PROVIDER_SITE_OTHER): Payer: Medicare Other | Admitting: Sports Medicine

## 2020-03-03 DIAGNOSIS — Z981 Arthrodesis status: Secondary | ICD-10-CM

## 2020-03-03 MED ORDER — PREDNISONE 50 MG PO TABS: ORAL_TABLET | ORAL | 0 refills | Status: DC
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
## 2020-03-04 ENCOUNTER — Other Ambulatory Visit: Payer: Self-pay | Admitting: Family Medicine

## 2020-03-10 ENCOUNTER — Telehealth: Payer: Self-pay

## 2020-03-10 NOTE — Telephone Encounter (Signed)
Patient called, she started her Prednisone on Saturday but is still in a lot of pain. Wanting to know if something additional can be sent in

## 2020-03-11 ENCOUNTER — Ambulatory Visit: Payer: Medicare Other | Admitting: Neurology

## 2020-03-11 MED ORDER — TRAMADOL HCL 50 MG PO TABS: 50.0000 mg | ORAL_TABLET | Freq: Three times a day (TID) | ORAL | 0 refills | Status: DC | PRN
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
## 2020-03-11 NOTE — Telephone Encounter (Signed)
Patient advised.

## 2020-03-11 NOTE — Telephone Encounter (Signed)
Tramadol

## 2020-03-12 ENCOUNTER — Other Ambulatory Visit: Payer: Self-pay

## 2020-03-12 ENCOUNTER — Ambulatory Visit (INDEPENDENT_AMBULATORY_CARE_PROVIDER_SITE_OTHER): Payer: Medicare Other | Admitting: Rehabilitative and Restorative Service Providers"

## 2020-03-12 ENCOUNTER — Encounter: Payer: Self-pay | Admitting: Rehabilitative and Restorative Service Providers"

## 2020-03-12 DIAGNOSIS — R2689 Other abnormalities of gait and mobility: Secondary | ICD-10-CM

## 2020-03-12 DIAGNOSIS — R29898 Other symptoms and signs involving the musculoskeletal system: Secondary | ICD-10-CM | POA: Diagnosis not present

## 2020-03-12 DIAGNOSIS — M545 Low back pain, unspecified: Secondary | ICD-10-CM | POA: Diagnosis not present
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
## 2020-03-17 ENCOUNTER — Other Ambulatory Visit: Payer: Self-pay

## 2020-03-17 ENCOUNTER — Ambulatory Visit (INDEPENDENT_AMBULATORY_CARE_PROVIDER_SITE_OTHER): Payer: Medicare Other | Admitting: Sports Medicine

## 2020-03-17 DIAGNOSIS — Z4889 Encounter for other specified surgical aftercare: Secondary | ICD-10-CM

## 2020-03-17 DIAGNOSIS — Z981 Arthrodesis status: Secondary | ICD-10-CM
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
## 2020-03-18 ENCOUNTER — Telehealth: Payer: Self-pay

## 2020-03-18 NOTE — Telephone Encounter (Signed)
Spoke with patient for [place] to screen her medications before getting her scheduled for a lumbar myelogram.  She stated an understanding that she will be here two hours, will need a driver and will need to be on strict bedrest (explained) for 24 hours.  She also stated an understanding that she needs to hold Perphenazine, Sinequan and Tramadol for 48 hours before, and 24 hours after, the myelogram.

## 2020-03-26 ENCOUNTER — Other Ambulatory Visit: Payer: Self-pay

## 2020-03-26 ENCOUNTER — Ambulatory Visit (INDEPENDENT_AMBULATORY_CARE_PROVIDER_SITE_OTHER): Payer: Medicare Other | Admitting: Sports Medicine

## 2020-03-26 DIAGNOSIS — Z981 Arthrodesis status: Secondary | ICD-10-CM

## 2020-03-26 MED ORDER — TRAMADOL HCL 50 MG PO TABS
50.0000 mg | ORAL_TABLET | Freq: Three times a day (TID) | ORAL | 0 refills | Status: DC | PRN
Start: 2020-03-26 — End: 2020-04-10
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
## 2020-03-27 ENCOUNTER — Other Ambulatory Visit: Payer: Medicare Other

## 2020-04-01 ENCOUNTER — Other Ambulatory Visit: Payer: Medicare Other

## 2020-04-02 ENCOUNTER — Ambulatory Visit
Admission: RE | Admit: 2020-04-02 | Discharge: 2020-04-02 | Disposition: A | Discharge status: Home / Self Care | Payer: Medicare Other | Source: Ambulatory Visit | Attending: Sports Medicine | Admitting: Sports Medicine

## 2020-04-02 ENCOUNTER — Other Ambulatory Visit: Payer: Self-pay

## 2020-04-02 DIAGNOSIS — M5136 Other intervertebral disc degeneration, lumbar region: Secondary | ICD-10-CM | POA: Diagnosis not present

## 2020-04-02 DIAGNOSIS — M4056 Lordosis, unspecified, lumbar region: Secondary | ICD-10-CM | POA: Diagnosis not present

## 2020-04-02 DIAGNOSIS — M5126 Other intervertebral disc displacement, lumbar region: Secondary | ICD-10-CM | POA: Diagnosis not present

## 2020-04-02 DIAGNOSIS — M4326 Fusion of spine, lumbar region: Secondary | ICD-10-CM | POA: Diagnosis not present

## 2020-04-02 MED ORDER — METHYLPREDNISOLONE ACETATE 40 MG/ML INJ SUSP (RADIOLOG: 120.0000 mg | Freq: Once | INTRAMUSCULAR | Status: DC

## 2020-04-02 MED ORDER — MEPERIDINE HCL 50 MG/ML IJ SOLN
50.0000 mg | Freq: Once | INTRAMUSCULAR | Status: AC
  Administered 2020-04-02: 50 mg via INTRAMUSCULAR

## 2020-04-02 MED ORDER — IOPAMIDOL (ISOVUE-M 200) INJECTION 41%
18.0000 mL | Freq: Once | INTRAMUSCULAR | Status: AC
  Administered 2020-04-02: 18 mL via INTRATHECAL

## 2020-04-02 MED ORDER — IOPAMIDOL (ISOVUE-M 200) INJECTION 41%: 1.0000 mL | Freq: Once | INTRAMUSCULAR | Status: DC

## 2020-04-02 MED ORDER — DIAZEPAM 5 MG PO TABS
5.0000 mg | ORAL_TABLET | Freq: Once | ORAL | Status: AC
  Administered 2020-04-02: 5 mg via ORAL

## 2020-04-02 MED ORDER — ONDANSETRON HCL 4 MG/2ML IJ SOLN
4.0000 mg | Freq: Once | INTRAMUSCULAR | Status: AC
  Administered 2020-04-02: 4 mg via INTRAMUSCULAR

## 2020-04-02 NOTE — Progress Notes (Signed)
Pt reported she stopped doxepin, trilafon and tramadol at least 48 hours before procedure.

## 2020-04-02 NOTE — Progress Notes (Signed)
Pt was completely pain free before d/c to home

## 2020-04-02 NOTE — Discharge Instructions (Signed)
Myelogram Discharge Instructions  1. Go home and rest quietly for the next 24 hours.  It is important to lie flat for the next 24 hours.  Get up only to go to the restroom.  You may lie in the bed or on a couch on your back, your stomach, your left side or your right side.  You may have one pillow under your head.  You may have pillows between your knees while you are on your side or under your knees while you are on your back.  2. DO NOT drive today.  Recline the seat as far back as it will go, while still wearing your seat belt, on the way home.  3. You may get up to go to the bathroom as needed.  You may sit up for 10 minutes to eat.  You may resume your normal diet and medications unless otherwise indicated.  Drink lots of extra fluids today and tomorrow.  4. The incidence of headache, nausea, or vomiting is about 5% (one in 20 patients).  If you develop a headache, lie flat and drink plenty of fluids until the headache goes away.  Caffeinated beverages may be helpful.  If you develop severe nausea and vomiting or a headache that does not go away with flat bed rest, call [phone].  5. You may resume normal activities after your 24 hours of bed rest is over; however, do not exert yourself strongly or do any heavy lifting tomorrow. If when you get up you have a headache when standing, go back to bed and force fluids for another 24 hours.  6. Call your physician for a follow-up appointment.  The results of your myelogram will be sent directly to your physician by the following day.  7. If you have any questions or if complications develop after you arrive home, please call [phone].  Discharge instructions have been explained to the patient.  The patient, or the person responsible for the patient, fully understands these instructions  YOU MAY RESUME YOUR PERPHENAZINE, SINEQUAN, AND TRAMADOL ON 04/03/20 AT 10:30AM

## 2020-04-02 NOTE — Progress Notes (Signed)
Pt c/o severe pain.  See MAR.

## 2020-04-03 NOTE — Addendum Note (Signed)
Addended by: Silverio Decamp on: 04/03/2020 08:43 PM   Modules accepted: Orders

## 2020-04-08 DIAGNOSIS — M47816 Spondylosis without myelopathy or radiculopathy, lumbar region: Secondary | ICD-10-CM | POA: Diagnosis not present

## 2020-04-08 DIAGNOSIS — M4316 Spondylolisthesis, lumbar region: Secondary | ICD-10-CM | POA: Diagnosis not present

## 2020-04-08 DIAGNOSIS — M5136 Other intervertebral disc degeneration, lumbar region: Secondary | ICD-10-CM | POA: Diagnosis not present

## 2020-04-08 DIAGNOSIS — M48062 Spinal stenosis, lumbar region with neurogenic claudication: Secondary | ICD-10-CM | POA: Diagnosis not present

## 2020-04-09 ENCOUNTER — Other Ambulatory Visit: Payer: Self-pay | Admitting: Neurosurgery

## 2020-04-10 ENCOUNTER — Ambulatory Visit (INDEPENDENT_AMBULATORY_CARE_PROVIDER_SITE_OTHER): Payer: Medicare Other | Admitting: Sports Medicine

## 2020-04-10 ENCOUNTER — Other Ambulatory Visit: Payer: Self-pay

## 2020-04-10 DIAGNOSIS — Z981 Arthrodesis status: Secondary | ICD-10-CM | POA: Diagnosis not present

## 2020-04-10 MED ORDER — TRAMADOL HCL 50 MG PO TABS: 50.0000 mg | ORAL_TABLET | Freq: Three times a day (TID) | ORAL | 0 refills | Status: DC | PRN

## 2020-04-10 NOTE — Assessment & Plan Note (Signed)
Amanda Morris returns, she is a pleasant 69 year old female with a history of an L4-L5 fusion with Dr. 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 Initially we had planned a left L3-L4 interlaminar epidural, she did see Dr. Christella Noa who is going to proceed with an L3-L4 fusion. Okay to discontinue plans for the injection, I am going to refill her tramadol which she uses approximately 3 times daily, and I can see her back on an as-needed basis. Of note she gets excessive nausea with oxycodone and hydrocodone. Ultimate goal is return to meaningful labor.

## 2020-04-10 NOTE — Progress Notes (Signed)
    Procedures performed today:    None.  Independent interpretation of notes and tests performed by another provider:   None.  Brief History, Exam, Impression, and Recommendations:    History of lumbar fusion Amanda Morris returns, she is a pleasant 69 year old female with a history of an L4-L5 fusion with Dr. 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 Initially we had planned a left L3-L4 interlaminar epidural, she did see Dr. Christella Noa who is going to proceed with an L3-L4 fusion. Okay to discontinue plans for the injection, I am going to refill her tramadol which she uses approximately 3 times daily, and I can see her back on an as-needed basis. Of note she gets excessive nausea with oxycodone and hydrocodone. Ultimate goal is return to meaningful labor.    ___________________________________________ Gwen Her. Dianah Field, M.D., ABFM., CAQSM. Primary Care and [place] Instructor of [place] of [facility] of Medicine

## 2020-04-11 NOTE — Progress Notes (Signed)
[id number] - [address], [place] [address] Phone: [phone] Fax: [phone]      Your procedure is scheduled on 04/16/20.  Report to [facility] Main Entrance "A" at 8:00 A.M., and check in at the Admitting office.  Call this number if you have problems the morning of surgery:  [phone]  Call [phone] if you have any questions prior to your surgery date Monday-Friday 8am-4pm    Remember:  Do not eat or drink after midnight the night before your surgery    Take these medicines the morning of surgery with A SIP OF WATER: DEXILANT  dicyclomine (BENTYL) pregabalin (LYRICA)  As Needed: fluticasone (FLONASE) traMADol (ULTRAM)  As of today, STOP taking any Aspirin (unless otherwise instructed by your surgeon) Aleve, Naproxen, Ibuprofen, Motrin, Advil, Goody's, BC's, all herbal medications, fish oil, and all vitamins.   WHAT DO I DO ABOUT MY DIABETES MEDICATION?   Marland Kitchen Do not take oral diabetes medicines (pills) the morning of surgery.  Hold Jardiance the  day before surgery and day of surgery.   . THE NIGHT BEFORE SURGERY, take __50%_(14 units)________ units of _______Lantus____insulin.       . The day of surgery, do not take other diabetes injectables, including Byetta (exenatide), Bydureon (exenatide ER), Victoza (liraglutide), or Trulicity (dulaglutide).   HOW TO MANAGE YOUR DIABETES BEFORE AND AFTER SURGERY  Why is it important to control my blood sugar before and after surgery? . Improving blood sugar levels before and after surgery helps healing and can limit problems. . A way of improving blood sugar control is eating a healthy diet by: o  Eating less sugar and carbohydrates o  Increasing activity/exercise o  Talking with your doctor about reaching your blood sugar goals . High blood sugars (greater than 180 mg/dL) can raise your risk of infections and slow your  recovery, so you will need to focus on controlling your diabetes during the weeks before surgery. . Make sure that the doctor who takes care of your diabetes knows about your planned surgery including the date and location.  How do I manage my blood sugar before surgery? . Check your blood sugar at least 4 times a day, starting 2 days before surgery, to make sure that the level is not too high or low. . Check your blood sugar the morning of your surgery when you wake up and every 2 hours until you get to the Short Stay unit. o If your blood sugar is less than 70 mg/dL, you will need to treat for low blood sugar: - Do not take insulin. - Treat a low blood sugar (less than 70 mg/dL) with  cup of clear juice (cranberry or apple), 4 glucose tablets, OR glucose gel. - Recheck blood sugar in 15 minutes after treatment (to make sure it is greater than 70 mg/dL). If your blood sugar is not greater than 70 mg/dL on recheck, call [phone] for further instructions. . Report your blood sugar to the short stay nurse when you get to Short Stay.  . If you are admitted to the hospital after surgery: o Your blood sugar will be checked by the staff and you will probably be given insulin after surgery (instead of oral diabetes medicines) to make sure you have good blood sugar levels. o The goal for blood sugar control after surgery is 80-180 mg/dL.  Do not wear jewelry, make up, or nail polish            Do not wear lotions, powders, perfumes or deodorant.            Do not shave 48 hours prior to surgery.              Do not bring valuables to the hospital.            [facility] is not responsible for any belongings or valuables.  Do NOT Smoke (Tobacco/Vaping) or drink Alcohol 24 hours prior to your procedure If you use a CPAP at night, you may bring all equipment for your overnight stay.   Contacts, glasses, dentures or bridgework may not be worn into surgery.      For patients  admitted to the hospital, discharge time will be determined by your treatment team.   Patients discharged the day of surgery will not be allowed to drive home, and someone needs to stay with them for 24 hours.    Special instructions:   [place]- Preparing For Surgery  Before surgery, you can play an important role. Because skin is not sterile, your skin needs to be as free of germs as possible. You can reduce the number of germs on your skin by washing with CHG (chlorahexidine gluconate) Soap before surgery.  CHG is an antiseptic cleaner which kills germs and bonds with the skin to continue killing germs even after washing.    Oral Hygiene is also important to reduce your risk of infection.  Remember - BRUSH YOUR TEETH THE MORNING OF SURGERY WITH YOUR REGULAR TOOTHPASTE  Please do not use if you have an allergy to CHG or antibacterial soaps. If your skin becomes reddened/irritated stop using the CHG.  Do not shave (including legs and underarms) for at least 48 hours prior to first CHG shower. It is OK to shave your face.  Please follow these instructions carefully.   1. Shower the NIGHT BEFORE SURGERY and the MORNING OF SURGERY with CHG Soap.   2. If you chose to wash your hair, wash your hair first as usual with your normal shampoo.  3. After you shampoo, rinse your hair and body thoroughly to remove the shampoo.  4. Use CHG as you would any other liquid soap. You can apply CHG directly to the skin and wash gently with a scrungie or a clean washcloth.   5. Apply the CHG Soap to your body ONLY FROM THE NECK DOWN.  Do not use on open wounds or open sores. Avoid contact with your eyes, ears, mouth and genitals (private parts). Wash Face and genitals (private parts)  with your normal soap.   6. Wash thoroughly, paying special attention to the area where your surgery will be performed.  7. Thoroughly rinse your body with warm water from the neck down.  8. DO NOT shower/wash with your  normal soap after using and rinsing off the CHG Soap.  9. Pat yourself dry with a CLEAN TOWEL.  10. Wear CLEAN PAJAMAS to bed the night before surgery  11. Place CLEAN SHEETS on your bed the night of your first shower and DO NOT SLEEP WITH PETS.   Day of Surgery: Wear Clean/Comfortable clothing the morning of surgery Do not apply any deodorants/lotions.   Remember to brush your teeth WITH YOUR REGULAR TOOTHPASTE.   Please read over the following fact sheets that you were given.

## 2020-04-14 ENCOUNTER — Other Ambulatory Visit: Payer: Self-pay

## 2020-04-14 ENCOUNTER — Other Ambulatory Visit (HOSPITAL_COMMUNITY)
Admission: RE | Admit: 2020-04-14 | Discharge: 2020-04-14 | Disposition: A | Discharge status: Home / Self Care | Payer: Medicare Other | Source: Ambulatory Visit | Attending: Neurosurgery | Admitting: Neurosurgery

## 2020-04-14 ENCOUNTER — Encounter (HOSPITAL_COMMUNITY)
Admission: RE | Admit: 2020-04-14 | Discharge: 2020-04-14 | Disposition: A | Discharge status: Home / Self Care | Payer: Medicare Other | Source: Ambulatory Visit | Attending: Neurosurgery | Admitting: Neurosurgery

## 2020-04-14 ENCOUNTER — Encounter (HOSPITAL_COMMUNITY): Payer: Self-pay

## 2020-04-14 DIAGNOSIS — Z79899 Other long term (current) drug therapy: Secondary | ICD-10-CM | POA: Diagnosis not present

## 2020-04-14 DIAGNOSIS — Z886 Allergy status to analgesic agent status: Secondary | ICD-10-CM | POA: Diagnosis not present

## 2020-04-14 DIAGNOSIS — Z20822 Contact with and (suspected) exposure to covid-19: Secondary | ICD-10-CM | POA: Insufficient documentation

## 2020-04-14 DIAGNOSIS — Z885 Allergy status to narcotic agent status: Secondary | ICD-10-CM | POA: Diagnosis not present

## 2020-04-14 DIAGNOSIS — M5136 Other intervertebral disc degeneration, lumbar region: Secondary | ICD-10-CM | POA: Diagnosis not present

## 2020-04-14 DIAGNOSIS — Z7984 Long term (current) use of oral hypoglycemic drugs: Secondary | ICD-10-CM | POA: Diagnosis not present

## 2020-04-14 DIAGNOSIS — M48062 Spinal stenosis, lumbar region with neurogenic claudication: Secondary | ICD-10-CM | POA: Diagnosis not present

## 2020-04-14 DIAGNOSIS — M4316 Spondylolisthesis, lumbar region: Secondary | ICD-10-CM | POA: Diagnosis not present

## 2020-04-14 DIAGNOSIS — Z87891 Personal history of nicotine dependence: Secondary | ICD-10-CM | POA: Diagnosis not present

## 2020-04-14 DIAGNOSIS — Z0389 Encounter for observation for other suspected diseases and conditions ruled out: Secondary | ICD-10-CM | POA: Diagnosis not present

## 2020-04-14 DIAGNOSIS — Z01812 Encounter for preprocedural laboratory examination: Secondary | ICD-10-CM | POA: Insufficient documentation

## 2020-04-14 DIAGNOSIS — Z888 Allergy status to other drugs, medicaments and biological substances status: Secondary | ICD-10-CM | POA: Diagnosis not present

## 2020-04-14 DIAGNOSIS — Z9049 Acquired absence of other specified parts of digestive tract: Secondary | ICD-10-CM | POA: Diagnosis not present

## 2020-04-14 DIAGNOSIS — K449 Diaphragmatic hernia without obstruction or gangrene: Secondary | ICD-10-CM | POA: Diagnosis not present

## 2020-04-14 DIAGNOSIS — Z7982 Long term (current) use of aspirin: Secondary | ICD-10-CM | POA: Diagnosis not present

## 2020-04-14 DIAGNOSIS — Z794 Long term (current) use of insulin: Secondary | ICD-10-CM | POA: Diagnosis not present

## 2020-04-14 DIAGNOSIS — K219 Gastro-esophageal reflux disease without esophagitis: Secondary | ICD-10-CM | POA: Diagnosis not present

## 2020-04-14 DIAGNOSIS — Z880 Allergy status to penicillin: Secondary | ICD-10-CM | POA: Diagnosis not present

## 2020-04-14 DIAGNOSIS — E7800 Pure hypercholesterolemia, unspecified: Secondary | ICD-10-CM | POA: Diagnosis not present

## 2020-04-14 DIAGNOSIS — I10 Essential (primary) hypertension: Secondary | ICD-10-CM | POA: Diagnosis not present

## 2020-04-14 DIAGNOSIS — E119 Type 2 diabetes mellitus without complications: Secondary | ICD-10-CM | POA: Diagnosis not present

## 2020-04-14 LAB — BASIC METABOLIC PANEL
Anion gap: 11 (ref 5–15)
BUN: 10 mg/dL (ref 8–23)
CO2: 24 mmol/L (ref 22–32)
Calcium: 8.7 mg/dL — ABNORMAL LOW (ref 8.9–10.3)
Chloride: 106 mmol/L (ref 98–111)
Creatinine, Ser: 0.77 mg/dL (ref 0.44–1.00)
GFR, Estimated: >60 mL/min (ref >60)
Glucose, Bld: 95 mg/dL (ref 70–99)
Potassium: 3.9 mmol/L (ref 3.5–5.1)
Sodium: 141 mmol/L (ref 135–145)

## 2020-04-14 LAB — GLUCOSE, CAPILLARY: Glucose-Capillary: 101 mg/dL — ABNORMAL HIGH (ref 70–99)

## 2020-04-14 LAB — SURGICAL PCR SCREEN
MRSA, PCR: NEGATIVE
Staphylococcus aureus: POSITIVE — AB

## 2020-04-14 LAB — CBC
HCT: 42.2 % (ref 36.0–46.0)
Hemoglobin: 12.9 g/dL (ref 12.0–15.0)
MCH: 23.2 pg — ABNORMAL LOW (ref 26.0–34.0)
MCHC: 30.6 g/dL (ref 30.0–36.0)
MCV: 75.9 fL — ABNORMAL LOW (ref 80.0–100.0)
Platelets: 246 10*3/uL (ref 150–400)
RBC: 5.56 MIL/uL — ABNORMAL HIGH (ref 3.87–5.11)
RDW: 16.9 % — ABNORMAL HIGH (ref 11.5–15.5)
WBC: 7.9 10*3/uL (ref 4.0–10.5)
nRBC: 0 % (ref 0.0–0.2)

## 2020-04-14 LAB — TYPE AND SCREEN
ABO/RH(D): B NEG
Antibody Screen: NEGATIVE

## 2020-04-14 LAB — SARS CORONAVIRUS 2 (TAT 6-24 HRS): SARS Coronavirus 2: NEGATIVE

## 2020-04-14 NOTE — Progress Notes (Signed)
PCP:  Dr. Beatrice Lecher Cardiologist:  Denies  EKG:  N/A CXR:  N/A ECHO:  Denies Stress Test:  denies Cardiac Cath:  Denies  Fasting Blood Sugar-  54-215 Checks Blood Sugar_3__ times a day  OSA/CPAP:  No  ASA/lBlood Thinners:  Patient to stop ASA today 04/15/20  Anesthesia Review:  No  Patient denies shortness of breath, fever, cough, and chest pain at PAT appointment.  Patient verbalized understanding of instructions provided today at the PAT appointment.  Patient asked to review instructions at home and day of surgery.

## 2020-04-15 MED ORDER — VANCOMYCIN HCL 1500 MG/300ML IV SOLN
1500.0000 mg | INTRAVENOUS | Status: AC
  Administered 2020-04-16: 1500 mg via INTRAVENOUS
  Filled 2020-04-15: qty 300

## 2020-04-15 NOTE — Anesthesia Preprocedure Evaluation (Addendum)
Anesthesia Evaluation  Patient identified by MRN, date of birth, ID band Patient awake    Reviewed: Allergy & Precautions, NPO status , Patient's Chart, lab work & pertinent test results  Airway Mallampati: II  TM Distance: >3 FB Neck ROM: Full    Dental  (+) Upper Dentures   Pulmonary neg pulmonary ROS, former smoker,    Pulmonary exam normal        Cardiovascular hypertension, Pt. on medications  Rhythm:Regular Rate:Normal     Neuro/Psych  Headaches, negative psych ROS   GI/Hepatic Neg liver ROS, hiatal hernia, GERD  Medicated and Controlled,  Endo/Other  diabetes, Well Controlled, Insulin Dependent, Oral Hypoglycemic Agents  Renal/GU   negative genitourinary   Musculoskeletal spondylolisthesis   Abdominal (+)  Abdomen: soft. Bowel sounds: normal.  Peds  Hematology negative hematology ROS (+)   Anesthesia Other Findings   Reproductive/Obstetrics                            Anesthesia Physical Anesthesia Plan  ASA: II  Anesthesia Plan: General   Post-op Pain Management:    Induction: Intravenous  PONV Risk Score and Plan: 3 and Ondansetron, Dexamethasone and Treatment may vary due to age or medical condition  Airway Management Planned: Mask and Oral ETT  Additional Equipment: None  Intra-op Plan:   Post-operative Plan: Extubation in OR  Informed Consent: I have reviewed the patients History and Physical, chart, labs and discussed the procedure including the risks, benefits and alternatives for the proposed anesthesia with the patient or authorized representative who has indicated his/her understanding and acceptance.     Dental advisory given  Plan Discussed with: CRNA  Anesthesia Plan Comments: (Lab Results      Component                Value               Date                      WBC                      7.9                 04/14/2020                HGB                       12.9                04/14/2020                HCT                      42.2                04/14/2020                MCV                      75.9 (L)            04/14/2020                PLT                      246  04/14/2020           Lab Results      Component                Value               Date                      NA                       141                 04/14/2020                K                        3.9                 04/14/2020                CO2                      24                  04/14/2020                GLUCOSE                  95                  04/14/2020                BUN                      10                  04/14/2020                CREATININE               0.77                04/14/2020                CALCIUM                  8.7 (L)             04/14/2020                GFRNONAA                 >60                 04/14/2020                GFRAA                    99                  11/28/2019          )        Anesthesia Quick Evaluation

## 2020-04-16 ENCOUNTER — Inpatient Hospital Stay (HOSPITAL_COMMUNITY): Payer: Medicare Other | Admitting: Certified Registered Nurse Anesthetist

## 2020-04-16 ENCOUNTER — Other Ambulatory Visit: Payer: Self-pay

## 2020-04-16 ENCOUNTER — Inpatient Hospital Stay (HOSPITAL_COMMUNITY)
Admission: RE | Admit: 2020-04-16 | Discharge: 2020-04-17 | DRG: 460 | Disposition: A | Discharge status: Home / Self Care | Payer: Medicare Other | Attending: Neurosurgery | Admitting: Neurosurgery

## 2020-04-16 ENCOUNTER — Encounter (HOSPITAL_COMMUNITY): Payer: Self-pay | Admitting: Neurosurgery

## 2020-04-16 ENCOUNTER — Inpatient Hospital Stay (HOSPITAL_COMMUNITY): Payer: Medicare Other

## 2020-04-16 ENCOUNTER — Encounter (HOSPITAL_COMMUNITY)
Admission: RE | Disposition: A | Discharge status: Home / Self Care | Payer: Self-pay | Source: Home / Self Care | Attending: Neurosurgery

## 2020-04-16 DIAGNOSIS — Z880 Allergy status to penicillin: Secondary | ICD-10-CM | POA: Diagnosis not present

## 2020-04-16 DIAGNOSIS — M4316 Spondylolisthesis, lumbar region: Secondary | ICD-10-CM | POA: Diagnosis not present

## 2020-04-16 DIAGNOSIS — I10 Essential (primary) hypertension: Secondary | ICD-10-CM | POA: Diagnosis not present

## 2020-04-16 DIAGNOSIS — Z888 Allergy status to other drugs, medicaments and biological substances status: Secondary | ICD-10-CM

## 2020-04-16 DIAGNOSIS — Z79899 Other long term (current) drug therapy: Secondary | ICD-10-CM | POA: Diagnosis not present

## 2020-04-16 DIAGNOSIS — M48062 Spinal stenosis, lumbar region with neurogenic claudication: Secondary | ICD-10-CM | POA: Diagnosis present

## 2020-04-16 DIAGNOSIS — Z794 Long term (current) use of insulin: Secondary | ICD-10-CM

## 2020-04-16 DIAGNOSIS — Z885 Allergy status to narcotic agent status: Secondary | ICD-10-CM

## 2020-04-16 DIAGNOSIS — Z7982 Long term (current) use of aspirin: Secondary | ICD-10-CM | POA: Diagnosis not present

## 2020-04-16 DIAGNOSIS — E7800 Pure hypercholesterolemia, unspecified: Secondary | ICD-10-CM | POA: Diagnosis not present

## 2020-04-16 DIAGNOSIS — Z87891 Personal history of nicotine dependence: Secondary | ICD-10-CM

## 2020-04-16 DIAGNOSIS — Z886 Allergy status to analgesic agent status: Secondary | ICD-10-CM

## 2020-04-16 DIAGNOSIS — K449 Diaphragmatic hernia without obstruction or gangrene: Secondary | ICD-10-CM | POA: Diagnosis present

## 2020-04-16 DIAGNOSIS — Z0389 Encounter for observation for other suspected diseases and conditions ruled out: Secondary | ICD-10-CM | POA: Diagnosis not present

## 2020-04-16 DIAGNOSIS — K219 Gastro-esophageal reflux disease without esophagitis: Secondary | ICD-10-CM | POA: Diagnosis present

## 2020-04-16 DIAGNOSIS — E119 Type 2 diabetes mellitus without complications: Secondary | ICD-10-CM | POA: Diagnosis present

## 2020-04-16 DIAGNOSIS — Z7984 Long term (current) use of oral hypoglycemic drugs: Secondary | ICD-10-CM | POA: Diagnosis not present

## 2020-04-16 DIAGNOSIS — Z20822 Contact with and (suspected) exposure to covid-19: Secondary | ICD-10-CM | POA: Diagnosis present

## 2020-04-16 DIAGNOSIS — M5136 Other intervertebral disc degeneration, lumbar region: Secondary | ICD-10-CM | POA: Diagnosis not present

## 2020-04-16 DIAGNOSIS — Z9049 Acquired absence of other specified parts of digestive tract: Secondary | ICD-10-CM | POA: Diagnosis not present

## 2020-04-16 DIAGNOSIS — Z419 Encounter for procedure for purposes other than remedying health state, unspecified: Secondary | ICD-10-CM

## 2020-04-16 LAB — GLUCOSE, CAPILLARY
Glucose-Capillary: 112 mg/dL — ABNORMAL HIGH (ref 70–99)
Glucose-Capillary: 117 mg/dL — ABNORMAL HIGH (ref 70–99)
Glucose-Capillary: 111 mg/dL — ABNORMAL HIGH (ref 70–99)
Glucose-Capillary: 127 mg/dL — ABNORMAL HIGH (ref 70–99)

## 2020-04-16 LAB — CBC
HCT: 32.3 % — ABNORMAL LOW (ref 36.0–46.0)
Hemoglobin: 9.8 g/dL — ABNORMAL LOW (ref 12.0–15.0)
MCH: 23.4 pg — ABNORMAL LOW (ref 26.0–34.0)
MCHC: 30.3 g/dL (ref 30.0–36.0)
MCV: 77.1 fL — ABNORMAL LOW (ref 80.0–100.0)
Platelets: 168 10*3/uL (ref 150–400)
RBC: 4.19 MIL/uL (ref 3.87–5.11)
RDW: 16.9 % — ABNORMAL HIGH (ref 11.5–15.5)
WBC: 8.7 10*3/uL (ref 4.0–10.5)
nRBC: 0 % (ref 0.0–0.2)

## 2020-04-16 LAB — HEMOGLOBIN A1C
Hgb A1c MFr Bld: 6.9 % — ABNORMAL HIGH (ref 4.8–5.6)
Mean Plasma Glucose: 151.33 mg/dL

## 2020-04-16 LAB — CREATININE, SERUM
Creatinine, Ser: 0.54 mg/dL (ref 0.44–1.00)
GFR, Estimated: >60 mL/min (ref >60)

## 2020-04-16 SURGERY — POSTERIOR LUMBAR FUSION 1 LEVEL
Anesthesia: General

## 2020-04-16 MED ORDER — DEXMEDETOMIDINE (PRECEDEX) IN NS 20 MCG/5ML (4 MCG/ML) IV SYRINGE
PREFILLED_SYRINGE | INTRAVENOUS | Status: AC
  Filled 2020-04-16: qty 5

## 2020-04-16 MED ORDER — PERPHENAZINE 4 MG PO TABS
8.0000 mg | ORAL_TABLET | Freq: Every day | ORAL | Status: DC
  Administered 2020-04-16: 8 mg via ORAL
  Filled 2020-04-16 (×2): qty 1
  Filled 2020-04-16 (×2): qty 2

## 2020-04-16 MED ORDER — 0.9 % SODIUM CHLORIDE (POUR BTL) OPTIME
TOPICAL | Status: DC | PRN
  Administered 2020-04-16: 1000 mL

## 2020-04-16 MED ORDER — EMPAGLIFLOZIN 25 MG PO TABS
25.0000 mg | ORAL_TABLET | Freq: Every day | ORAL | Status: DC
  Administered 2020-04-17: 25 mg via ORAL
  Filled 2020-04-16: qty 1

## 2020-04-16 MED ORDER — AMISULPRIDE (ANTIEMETIC) 5 MG/2ML IV SOLN: 10.0000 mg | Freq: Once | INTRAVENOUS | Status: DC | PRN

## 2020-04-16 MED ORDER — ALBUMIN HUMAN 5 % IV SOLN: INTRAVENOUS | Status: DC | PRN

## 2020-04-16 MED ORDER — PHENOL 1.4 % MT LIQD: 1.0000 | OROMUCOSAL | Status: DC | PRN

## 2020-04-16 MED ORDER — FENTANYL CITRATE (PF) 100 MCG/2ML IJ SOLN: 25.0000 ug | INTRAMUSCULAR | Status: DC | PRN

## 2020-04-16 MED ORDER — LACTATED RINGERS IV SOLN: INTRAVENOUS | Status: DC

## 2020-04-16 MED ORDER — BUPIVACAINE HCL (PF) 0.5 % IJ SOLN
INTRAMUSCULAR | Status: AC
  Filled 2020-04-16: qty 30

## 2020-04-16 MED ORDER — VITAMIN D 25 MCG (1000 UNIT) PO TABS
1000.0000 [IU] | ORAL_TABLET | Freq: Every day | ORAL | Status: DC
  Administered 2020-04-16 – 2020-04-17 (×2): 1000 [IU] via ORAL
  Filled 2020-04-16 (×2): qty 1

## 2020-04-16 MED ORDER — MIDAZOLAM HCL 2 MG/2ML IJ SOLN
INTRAMUSCULAR | Status: DC | PRN
  Administered 2020-04-16: 2 mg via INTRAVENOUS

## 2020-04-16 MED ORDER — SENNOSIDES-DOCUSATE SODIUM 8.6-50 MG PO TABS: 1.0000 | ORAL_TABLET | Freq: Every evening | ORAL | Status: DC | PRN

## 2020-04-16 MED ORDER — PREGABALIN 100 MG PO CAPS
200.0000 mg | ORAL_CAPSULE | Freq: Three times a day (TID) | ORAL | Status: DC
  Administered 2020-04-16 – 2020-04-17 (×3): 200 mg via ORAL
  Filled 2020-04-16 (×3): qty 2

## 2020-04-16 MED ORDER — MAGNESIUM CITRATE PO SOLN: 1.0000 | Freq: Once | ORAL | Status: DC | PRN

## 2020-04-16 MED ORDER — ACETAMINOPHEN 500 MG PO TABS
1000.0000 mg | ORAL_TABLET | Freq: Four times a day (QID) | ORAL | Status: AC
  Administered 2020-04-16 – 2020-04-17 (×4): 1000 mg via ORAL
  Filled 2020-04-16 (×4): qty 2

## 2020-04-16 MED ORDER — FLUTICASONE PROPIONATE 50 MCG/ACT NA SUSP
2.0000 | Freq: Two times a day (BID) | NASAL | Status: DC | PRN
  Filled 2020-04-16: qty 16

## 2020-04-16 MED ORDER — BUPIVACAINE HCL (PF) 0.5 % IJ SOLN
INTRAMUSCULAR | Status: DC | PRN
  Administered 2020-04-16: 30 mL

## 2020-04-16 MED ORDER — ONDANSETRON HCL 4 MG/2ML IJ SOLN
INTRAMUSCULAR | Status: DC | PRN
  Administered 2020-04-16: 4 mg via INTRAVENOUS

## 2020-04-16 MED ORDER — HEPARIN SODIUM (PORCINE) 5000 UNIT/ML IJ SOLN
5000.0000 [IU] | Freq: Three times a day (TID) | INTRAMUSCULAR | Status: DC
  Administered 2020-04-17 (×2): 5000 [IU] via SUBCUTANEOUS
  Filled 2020-04-16: qty 1

## 2020-04-16 MED ORDER — ORAL CARE MOUTH RINSE: 15.0000 mL | Freq: Once | OROMUCOSAL | Status: AC

## 2020-04-16 MED ORDER — CHLORHEXIDINE GLUCONATE 0.12 % MT SOLN
15.0000 mL | Freq: Once | OROMUCOSAL | Status: AC
  Administered 2020-04-16: 15 mL via OROMUCOSAL
  Filled 2020-04-16: qty 15

## 2020-04-16 MED ORDER — SODIUM CHLORIDE 0.9% FLUSH: 3.0000 mL | INTRAVENOUS | Status: DC | PRN

## 2020-04-16 MED ORDER — PHENYLEPHRINE 40 MCG/ML (10ML) SYRINGE FOR IV PUSH (FOR BLOOD PRESSURE SUPPORT)
PREFILLED_SYRINGE | INTRAVENOUS | Status: AC
  Filled 2020-04-16: qty 10

## 2020-04-16 MED ORDER — FAMOTIDINE 20 MG PO TABS
60.0000 mg | ORAL_TABLET | Freq: Every day | ORAL | Status: DC
  Administered 2020-04-16: 60 mg via ORAL
  Filled 2020-04-16: qty 3

## 2020-04-16 MED ORDER — OXYCODONE HCL ER 10 MG PO T12A
10.0000 mg | EXTENDED_RELEASE_TABLET | Freq: Two times a day (BID) | ORAL | Status: DC
Start: 2020-04-16 — End: 2020-04-17
  Administered 2020-04-16 – 2020-04-17 (×2): 10 mg via ORAL
  Filled 2020-04-16 (×2): qty 1

## 2020-04-16 MED ORDER — PHENYLEPHRINE 40 MCG/ML (10ML) SYRINGE FOR IV PUSH (FOR BLOOD PRESSURE SUPPORT)
PREFILLED_SYRINGE | INTRAVENOUS | Status: DC | PRN
  Administered 2020-04-16: 80 ug via INTRAVENOUS

## 2020-04-16 MED ORDER — TRAMADOL HCL 50 MG PO TABS
50.0000 mg | ORAL_TABLET | ORAL | Status: DC | PRN
  Administered 2020-04-16 – 2020-04-17 (×5): 100 mg via ORAL
  Filled 2020-04-16 (×5): qty 2

## 2020-04-16 MED ORDER — DEXAMETHASONE SODIUM PHOSPHATE 10 MG/ML IJ SOLN
INTRAMUSCULAR | Status: DC | PRN
  Administered 2020-04-16: 5 mg via INTRAVENOUS

## 2020-04-16 MED ORDER — PANTOPRAZOLE SODIUM 40 MG PO TBEC
40.0000 mg | DELAYED_RELEASE_TABLET | Freq: Every day | ORAL | Status: DC
  Administered 2020-04-17: 40 mg via ORAL
  Filled 2020-04-16: qty 1

## 2020-04-16 MED ORDER — ZOLPIDEM TARTRATE 5 MG PO TABS: 5.0000 mg | ORAL_TABLET | Freq: Every evening | ORAL | Status: DC | PRN

## 2020-04-16 MED ORDER — PROPOFOL 500 MG/50ML IV EMUL
INTRAVENOUS | Status: DC | PRN
  Administered 2020-04-16: 25 ug/kg/min via INTRAVENOUS

## 2020-04-16 MED ORDER — LIDOCAINE-EPINEPHRINE 0.5 %-1:200000 IJ SOLN
INTRAMUSCULAR | Status: AC
  Filled 2020-04-16: qty 1

## 2020-04-16 MED ORDER — CHLORHEXIDINE GLUCONATE CLOTH 2 % EX PADS: 6.0000 | MEDICATED_PAD | Freq: Once | CUTANEOUS | Status: DC

## 2020-04-16 MED ORDER — PROPOFOL 10 MG/ML IV BOLUS
INTRAVENOUS | Status: DC | PRN
  Administered 2020-04-16: 130 mg via INTRAVENOUS

## 2020-04-16 MED ORDER — ONDANSETRON HCL 4 MG PO TABS
4.0000 mg | ORAL_TABLET | Freq: Four times a day (QID) | ORAL | Status: DC | PRN
  Administered 2020-04-16: 4 mg via ORAL
  Filled 2020-04-16 (×2): qty 1

## 2020-04-16 MED ORDER — ROCURONIUM BROMIDE 10 MG/ML (PF) SYRINGE
PREFILLED_SYRINGE | INTRAVENOUS | Status: DC | PRN
  Administered 2020-04-16: 70 mg via INTRAVENOUS
  Administered 2020-04-16: 30 mg via INTRAVENOUS
  Administered 2020-04-16: 20 mg via INTRAVENOUS

## 2020-04-16 MED ORDER — ONDANSETRON HCL 4 MG/2ML IJ SOLN: 4.0000 mg | Freq: Once | INTRAMUSCULAR | Status: DC | PRN

## 2020-04-16 MED ORDER — ADULT MULTIVITAMIN W/MINERALS CH
1.0000 | ORAL_TABLET | Freq: Every day | ORAL | Status: DC
  Administered 2020-04-17: 1 via ORAL
  Filled 2020-04-16: qty 1

## 2020-04-16 MED ORDER — DOXEPIN HCL 10 MG PO CAPS
10.0000 mg | ORAL_CAPSULE | Freq: Every day | ORAL | Status: DC
  Administered 2020-04-16: 10 mg via ORAL
  Filled 2020-04-16 (×2): qty 1

## 2020-04-16 MED ORDER — LIDOCAINE 2% (20 MG/ML) 5 ML SYRINGE
INTRAMUSCULAR | Status: DC | PRN
  Administered 2020-04-16: 60 mg via INTRAVENOUS

## 2020-04-16 MED ORDER — ACETAMINOPHEN 10 MG/ML IV SOLN: 1000.0000 mg | Freq: Once | INTRAVENOUS | Status: DC | PRN

## 2020-04-16 MED ORDER — DICYCLOMINE HCL 10 MG PO CAPS
20.0000 mg | ORAL_CAPSULE | Freq: Three times a day (TID) | ORAL | Status: DC
  Administered 2020-04-16 – 2020-04-17 (×3): 20 mg via ORAL
  Filled 2020-04-16: qty 2

## 2020-04-16 MED ORDER — DEXMEDETOMIDINE (PRECEDEX) IN NS 20 MCG/5ML (4 MCG/ML) IV SYRINGE
PREFILLED_SYRINGE | INTRAVENOUS | Status: DC | PRN
  Administered 2020-04-16 (×2): 8 ug via INTRAVENOUS

## 2020-04-16 MED ORDER — PROPOFOL 10 MG/ML IV BOLUS
INTRAVENOUS | Status: AC
  Filled 2020-04-16: qty 20

## 2020-04-16 MED ORDER — THROMBIN 20000 UNITS EX SOLR
CUTANEOUS | Status: AC
  Filled 2020-04-16: qty 20000

## 2020-04-16 MED ORDER — LIDOCAINE 2% (20 MG/ML) 5 ML SYRINGE
INTRAMUSCULAR | Status: AC
  Filled 2020-04-16: qty 5

## 2020-04-16 MED ORDER — LIDOCAINE-EPINEPHRINE 0.5 %-1:200000 IJ SOLN
INTRAMUSCULAR | Status: DC | PRN
  Administered 2020-04-16: 20 mL

## 2020-04-16 MED ORDER — LEVOCETIRIZINE DIHYDROCHLORIDE 5 MG PO TABS: 5.0000 mg | ORAL_TABLET | Freq: Every evening | ORAL | Status: DC

## 2020-04-16 MED ORDER — THROMBIN 20000 UNITS EX SOLR
CUTANEOUS | Status: DC | PRN
  Administered 2020-04-16: 20 mL via TOPICAL

## 2020-04-16 MED ORDER — FENTANYL CITRATE (PF) 250 MCG/5ML IJ SOLN
INTRAMUSCULAR | Status: AC
  Filled 2020-04-16: qty 5

## 2020-04-16 MED ORDER — PRAVASTATIN SODIUM 40 MG PO TABS
40.0000 mg | ORAL_TABLET | Freq: Every day | ORAL | Status: DC
  Administered 2020-04-16: 40 mg via ORAL
  Filled 2020-04-16: qty 1

## 2020-04-16 MED ORDER — THROMBIN 20000 UNITS EX KIT
PACK | CUTANEOUS | Status: AC
  Filled 2020-04-16: qty 1

## 2020-04-16 MED ORDER — DIAZEPAM 5 MG PO TABS
5.0000 mg | ORAL_TABLET | Freq: Four times a day (QID) | ORAL | Status: DC | PRN
Start: 2020-04-16 — End: 2020-04-17
  Administered 2020-04-16 – 2020-04-17 (×2): 5 mg via ORAL
  Filled 2020-04-16 (×2): qty 1

## 2020-04-16 MED ORDER — ROCURONIUM BROMIDE 10 MG/ML (PF) SYRINGE
PREFILLED_SYRINGE | INTRAVENOUS | Status: AC
  Filled 2020-04-16: qty 10

## 2020-04-16 MED ORDER — GLIPIZIDE 5 MG PO TABS
10.0000 mg | ORAL_TABLET | Freq: Two times a day (BID) | ORAL | Status: DC
  Administered 2020-04-16 – 2020-04-17 (×2): 10 mg via ORAL
  Filled 2020-04-16 (×2): qty 2

## 2020-04-16 MED ORDER — DEXAMETHASONE SODIUM PHOSPHATE 10 MG/ML IJ SOLN
INTRAMUSCULAR | Status: AC
  Filled 2020-04-16: qty 1

## 2020-04-16 MED ORDER — ONDANSETRON HCL 4 MG/2ML IJ SOLN
INTRAMUSCULAR | Status: AC
  Filled 2020-04-16: qty 2

## 2020-04-16 MED ORDER — FENTANYL CITRATE (PF) 250 MCG/5ML IJ SOLN
INTRAMUSCULAR | Status: DC | PRN
  Administered 2020-04-16: 50 ug via INTRAVENOUS
  Administered 2020-04-16: 100 ug via INTRAVENOUS
  Administered 2020-04-16: 50 ug via INTRAVENOUS

## 2020-04-16 MED ORDER — SODIUM CHLORIDE 0.9% FLUSH
3.0000 mL | Freq: Two times a day (BID) | INTRAVENOUS | Status: DC
  Administered 2020-04-16: 3 mL via INTRAVENOUS

## 2020-04-16 MED ORDER — ONDANSETRON HCL 4 MG/2ML IJ SOLN: 4.0000 mg | Freq: Four times a day (QID) | INTRAMUSCULAR | Status: DC | PRN

## 2020-04-16 MED ORDER — POTASSIUM CHLORIDE IN NACL 20-0.9 MEQ/L-% IV SOLN: INTRAVENOUS | Status: DC

## 2020-04-16 MED ORDER — DICYCLOMINE HCL 20 MG PO TABS
20.0000 mg | ORAL_TABLET | Freq: Three times a day (TID) | ORAL | Status: DC
  Filled 2020-04-16 (×2): qty 1

## 2020-04-16 MED ORDER — PHENYLEPHRINE HCL-NACL 10-0.9 MG/250ML-% IV SOLN
INTRAVENOUS | Status: DC | PRN
  Administered 2020-04-16: 40 ug/min via INTRAVENOUS

## 2020-04-16 MED ORDER — BISACODYL 5 MG PO TBEC: 5.0000 mg | DELAYED_RELEASE_TABLET | Freq: Every day | ORAL | Status: DC | PRN

## 2020-04-16 MED ORDER — ASPIRIN EC 81 MG PO TBEC
81.0000 mg | DELAYED_RELEASE_TABLET | Freq: Every day | ORAL | Status: DC
Start: 2020-04-16 — End: 2020-04-17
  Administered 2020-04-16 – 2020-04-17 (×2): 81 mg via ORAL
  Filled 2020-04-16 (×2): qty 1

## 2020-04-16 MED ORDER — SODIUM CHLORIDE 0.9 % IV SOLN
250.0000 mL | INTRAVENOUS | Status: DC
  Administered 2020-04-16: 250 mL via INTRAVENOUS

## 2020-04-16 MED ORDER — INSULIN ASPART 100 UNIT/ML ~~LOC~~ SOLN: 0.0000 [IU] | Freq: Three times a day (TID) | SUBCUTANEOUS | Status: DC

## 2020-04-16 MED ORDER — MIDAZOLAM HCL 2 MG/2ML IJ SOLN
INTRAMUSCULAR | Status: AC
  Filled 2020-04-16: qty 2

## 2020-04-16 MED ORDER — ACETAMINOPHEN 325 MG PO TABS: 650.0000 mg | ORAL_TABLET | ORAL | Status: DC | PRN

## 2020-04-16 MED ORDER — LORATADINE 10 MG PO TABS
10.0000 mg | ORAL_TABLET | Freq: Every evening | ORAL | Status: DC
  Administered 2020-04-16: 10 mg via ORAL
  Filled 2020-04-16: qty 1

## 2020-04-16 MED ORDER — INSULIN GLARGINE 100 UNIT/ML SOLOSTAR PEN: 28.0000 [IU] | PEN_INJECTOR | Freq: Every day | SUBCUTANEOUS | Status: DC

## 2020-04-16 MED ORDER — INSULIN GLARGINE 100 UNIT/ML ~~LOC~~ SOLN
28.0000 [IU] | Freq: Every day | SUBCUTANEOUS | Status: DC
  Administered 2020-04-16: 28 [IU] via SUBCUTANEOUS
  Filled 2020-04-16 (×2): qty 0.28

## 2020-04-16 MED ORDER — ACETAMINOPHEN 650 MG RE SUPP: 650.0000 mg | RECTAL | Status: DC | PRN

## 2020-04-16 MED ORDER — MULTIVITAMIN WOMEN 50+ PO TABS: ORAL_TABLET | Freq: Every day | ORAL | Status: DC

## 2020-04-16 MED ORDER — DOCUSATE SODIUM 100 MG PO CAPS
100.0000 mg | ORAL_CAPSULE | Freq: Two times a day (BID) | ORAL | Status: DC
  Administered 2020-04-16 – 2020-04-17 (×2): 100 mg via ORAL
  Filled 2020-04-16 (×2): qty 1

## 2020-04-16 MED ORDER — SUGAMMADEX SODIUM 200 MG/2ML IV SOLN
INTRAVENOUS | Status: DC | PRN
  Administered 2020-04-16: 200 mg via INTRAVENOUS

## 2020-04-16 MED ORDER — MENTHOL 3 MG MT LOZG: 1.0000 | LOZENGE | OROMUCOSAL | Status: DC | PRN

## 2020-04-16 SURGICAL SUPPLY — 63 items
BENZOIN TINCTURE PRP APPL 2/3 (GAUZE/BANDAGES/DRESSINGS) IMPLANT
BIT DRILL PLIF MAS DISP 5.5MM (DRILL) ×1 IMPLANT
BLADE CLIPPER SURG (BLADE) IMPLANT
BUR MATCHSTICK NEURO 3.0 LAGG (BURR) ×2 IMPLANT
BUR PRECISION FLUTE 5.0 (BURR) ×2 IMPLANT
CAGE CONVEX CASCADIA 8.5X22X12 (Cage) ×4 IMPLANT
CANISTER SUCT 3000ML PPV (MISCELLANEOUS) ×2 IMPLANT
CARTRIDGE OIL MAESTRO DRILL (MISCELLANEOUS) ×1 IMPLANT
CNTNR URN SCR LID CUP LEK RST (MISCELLANEOUS) ×1 IMPLANT
CONT SPEC 4OZ STRL OR WHT (MISCELLANEOUS) ×1
COVER BACK TABLE 60X90IN (DRAPES) ×2 IMPLANT
COVER WAND RF STERILE (DRAPES) ×2 IMPLANT
DECANTER SPIKE VIAL GLASS SM (MISCELLANEOUS) ×2 IMPLANT
DERMABOND ADVANCED (GAUZE/BANDAGES/DRESSINGS) ×1
DERMABOND ADVANCED .7 DNX12 (GAUZE/BANDAGES/DRESSINGS) ×1 IMPLANT
DIFFUSER DRILL AIR PNEUMATIC (MISCELLANEOUS) ×2 IMPLANT
DRAPE C-ARM 42X72 X-RAY (DRAPES) ×4 IMPLANT
DRAPE C-ARMOR (DRAPES) ×2 IMPLANT
DRAPE LAPAROTOMY 100X72X124 (DRAPES) ×2 IMPLANT
DRAPE SURG 17X23 STRL (DRAPES) ×2 IMPLANT
DRILL PLIF MAS DISP 5.5MM (DRILL) ×2
DRSG OPSITE POSTOP 4X6 (GAUZE/BANDAGES/DRESSINGS) ×2 IMPLANT
DURAPREP 26ML APPLICATOR (WOUND CARE) ×2 IMPLANT
ELECT REM PT RETURN 9FT ADLT (ELECTROSURGICAL) ×2
ELECTRODE REM PT RTRN 9FT ADLT (ELECTROSURGICAL) ×1 IMPLANT
GAUZE 4X4 16PLY RFD (DISPOSABLE) IMPLANT
GAUZE SPONGE 4X4 12PLY STRL (GAUZE/BANDAGES/DRESSINGS) IMPLANT
GLOVE ECLIPSE 6.5 STRL STRAW (GLOVE) ×4 IMPLANT
GLOVE EXAM NITRILE XL STR (GLOVE) IMPLANT
GOWN STRL REUS W/ TWL LRG LVL3 (GOWN DISPOSABLE) ×2 IMPLANT
GOWN STRL REUS W/ TWL XL LVL3 (GOWN DISPOSABLE) ×1 IMPLANT
GOWN STRL REUS W/TWL 2XL LVL3 (GOWN DISPOSABLE) ×2 IMPLANT
GOWN STRL REUS W/TWL LRG LVL3 (GOWN DISPOSABLE) ×2
GOWN STRL REUS W/TWL XL LVL3 (GOWN DISPOSABLE) ×1
KIT BASIN OR (CUSTOM PROCEDURE TRAY) ×2 IMPLANT
KIT POSITION SURG JACKSON T1 (MISCELLANEOUS) ×2 IMPLANT
KIT TURNOVER KIT B (KITS) ×2 IMPLANT
MILL MEDIUM DISP (BLADE) ×2 IMPLANT
NEEDLE HYPO 25X1 1.5 SAFETY (NEEDLE) ×2 IMPLANT
NEEDLE SPNL 18GX3.5 QUINCKE PK (NEEDLE) ×2 IMPLANT
NS IRRIG 1000ML POUR BTL (IV SOLUTION) ×2 IMPLANT
OIL CARTRIDGE MAESTRO DRILL (MISCELLANEOUS) ×2
PACK LAMINECTOMY NEURO (CUSTOM PROCEDURE TRAY) ×2 IMPLANT
PAD ARMBOARD 7.5X6 YLW CONV (MISCELLANEOUS) IMPLANT
ROD 60MM LUMBAR (Rod) ×2 IMPLANT
ROD PLIF MAS 65MM (Rod) ×2 IMPLANT
SCREW LOCK (Screw) ×6 IMPLANT
SCREW LOCK FXNS SPNE MAS PL (Screw) ×6 IMPLANT
SCREW SHANK 6.5X40 (Screw) ×2 IMPLANT
SCREW SHANK 6.5X40 NS LF (Screw) ×2 IMPLANT
SCREW TULIP 5.5 (Screw) ×4 IMPLANT
SPONGE LAP 4X18 RFD (DISPOSABLE) IMPLANT
SPONGE SURGIFOAM ABS GEL 100 (HEMOSTASIS) ×2 IMPLANT
STRIP CLOSURE SKIN 1/2X4 (GAUZE/BANDAGES/DRESSINGS) IMPLANT
SUT PROLENE 6 0 BV (SUTURE) IMPLANT
SUT VIC AB 0 CT1 18XCR BRD8 (SUTURE) ×1 IMPLANT
SUT VIC AB 0 CT1 8-18 (SUTURE) ×1
SUT VIC AB 2-0 CT1 18 (SUTURE) ×2 IMPLANT
SUT VIC AB 3-0 SH 8-18 (SUTURE) ×2 IMPLANT
TOWEL GREEN STERILE (TOWEL DISPOSABLE) ×2 IMPLANT
TOWEL GREEN STERILE FF (TOWEL DISPOSABLE) ×2 IMPLANT
TRAY FOLEY MTR SLVR 16FR STAT (SET/KITS/TRAYS/PACK) ×2 IMPLANT
WATER STERILE IRR 1000ML POUR (IV SOLUTION) ×2 IMPLANT

## 2020-04-16 NOTE — Op Note (Signed)
04/16/2020  3:37 PM  PATIENT:  Amanda Morris  69 y.o. 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 female whom was taken to the operating room intubated, and placed under a general anesthetic without difficulty. A foley catheter was placed under sterile conditions. She was positioned prone on a Jackson table with all pressure points properly padded.  Her lumbar region was prepped and draped in a sterile manner. I infiltrated 20cc's 1/2%lidocaine/1:2000,000 strength epinephrine into the planned incision. I opened the skin with a 10 blade and took the incision down to the thoracolumbar fascia. I exposed the lamina of L3 in a subperiosteal fashion bilaterally. I confirmed my location with an intraoperative xray.  I placed self retaining retractors and started the decompression.  I decompressed the spinal canal via a near complete L3 laminectomy, inferior L3 facetecomies , partial L4 superior facectectomies and unroofing of the neural foramina bilaterally at L3/4 PLIF's were performed at L3/4  in the same fashion. I opened the disc space with a 15 blade then used a variety of instruments to remove the disc and prepare the space for the arthrodesis. I used curettes, rongeurs,  punches, shavers for the disc space, and rasps in the discetomy. I measured the disc space and placed 83mm  Titanium  Cages filled with autograft morsels(Stryker) into the disc space(s).  I placed pedicle screws at L3, using fluoroscopic guidance. I drilled a pilot hole, then cannulated the pedicle with a drill at each site. I then tapped each pedicle, assessing each site for pedicle violations. No cutouts were appreciated. Screws (nuvasive) were then placed at each site without difficulty. I attached rods and locking caps with the appropriate tools. The locking caps were secured with torque limited screwdrivers. Final films were performed and the final construct appeared to be in good position.  We closed the wound in a layered fashion. We approximated the thoracolumbar fascia, subcutaneous, and subcuticular planes with vicryl sutures. I used dermabond and an occlusive bandage for a sterile dressing.     PLAN OF CARE: Admit to inpatient   PATIENT DISPOSITION:  PACU - hemodynamically stable.   Delay start of Pharmacological VTE agent (>24hrs) due to surgical blood loss or risk of bleeding:  yes
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
## 2020-04-16 NOTE — Transfer of Care (Signed)
Immediate Anesthesia Transfer of Care Note  Patient: Amanda Morris  Procedure(s) Performed: Lumbar three-four Posterior lumbar interbody fusion ,arthrodesis segmental posterior instrumentation (N/A )  Patient Location: PACU  Anesthesia Type:General  Level of Consciousness: drowsy  Airway & Oxygen Therapy: Patient Spontanous Breathing and Patient connected to face mask oxygen  Post-op Assessment: Report given to RN and Post -op Vital signs reviewed and stable  Post vital signs: Reviewed and stable  Last Vitals:  Vitals Value Taken Time  BP 132/75 04/16/20 1355  Temp    Pulse 83 04/16/20 1356  Resp 16 04/16/20 1356  SpO2 100 % 04/16/20 1356  Vitals shown include unvalidated device data.  Last Pain:  Vitals:   04/16/20 0833  TempSrc:   PainSc: 1       Patients Stated Pain Goal: 2 (37/34/28 7681)  Complications: No complications documented.

## 2020-04-16 NOTE — Anesthesia Procedure Notes (Signed)
Procedure Name: Intubation Date/Time: 04/16/2020 9:48 AM Performed by: Reece Agar, CRNA Pre-anesthesia Checklist: Patient identified, Emergency Drugs available, Suction available and Patient being monitored Patient Re-evaluated:Patient Re-evaluated prior to induction Oxygen Delivery Method: Circle System Utilized Preoxygenation: Pre-oxygenation with 100% oxygen Induction Type: IV induction Ventilation: Mask ventilation without difficulty Laryngoscope Size: Mac and 3 Grade View: Grade I Tube type: Oral Tube size: 7.0 mm Number of attempts: 1 Airway Equipment and Method: Stylet Placement Confirmation: ETT inserted through vocal cords under direct vision,  positive ETCO2 and breath sounds checked- equal and bilateral Secured at: 21 cm Tube secured with: Tape Dental Injury: Teeth and Oropharynx as per pre-operative assessment

## 2020-04-17 LAB — GLUCOSE, CAPILLARY
Glucose-Capillary: 79 mg/dL (ref 70–99)
Glucose-Capillary: 118 mg/dL — ABNORMAL HIGH (ref 70–99)

## 2020-04-17 MED ORDER — OXYCODONE HCL ER 10 MG PO T12A: 10.0000 mg | EXTENDED_RELEASE_TABLET | Freq: Two times a day (BID) | ORAL | 0 refills | Status: AC

## 2020-04-17 MED ORDER — TIZANIDINE HCL 4 MG PO TABS: 4.0000 mg | ORAL_TABLET | Freq: Three times a day (TID) | ORAL | 0 refills | Status: DC | PRN

## 2020-04-17 NOTE — Discharge Summary (Signed)
Physician Discharge Summary  Patient ID: Amanda Morris MRN: [id number] DOB/AGE: Dec 18, 1950 69 y.o.  Admit date: 04/16/2020 Discharge date: 04/17/2020  Admission Diagnoses:Lumbar spinal stenosis L3/4  Discharge Diagnoses: Same Active Problems:   Spinal stenosis, lumbar region with neurogenic claudication   Discharged Condition: good  Hospital Course: Amanda Morris was admitted and taken to the operating room for an uncomplicated lumbar decompression and arthrodesis at L3/4. She is voiding, ambulating, and tolerating a regular diet at discharge. Her wound is clean, dry, and without signs of infection.  Treatments: surgery: Lumbar three-four Posterior lumbar interbody fusion, Stryker 80mm titanium cages, autograft morsel for the disc space and the cages Segmental pedicle screw fixation L3-5(Nuvasive)  Discharge Exam: Blood pressure (!) 101/57, pulse 72, temperature 98.5 F (36.9 C), temperature source Oral, resp. rate 16, height $RemoveBe'5\' 4"'LIiXUOKdo$  (1.626 m), weight 99.8 kg, SpO2 97 %. General appearance: alert, cooperative, appears stated age and mild distress  Disposition:  Lumbar adjacent segment disease with spondylolisthesis  Allergies as of 04/17/2020      Reactions   Penicillins Hives, Other (See Comments)   PATIENT HAS HAD A PCN REACTION WITH IMMEDIATE RASH, FACIAL/TONGUE/THROAT SWELLING, SOB, OR LIGHTHEADEDNESS WITH HYPOTENSION:  #  #  YES  #  #  Has patient had a PCN reaction causing severe rash involving mucus membranes or skin necrosis: No Has patient had a PCN reaction that required hospitalization: No Has patient had a PCN reaction occurring within the last 10 years: No If all of the above answers are "NO", then may proceed with Cephalosporin use.   Duloxetine Other (See Comments)   GI upset   Aspirin Nausea Only   Codeine Itching   Hydrocodone Nausea Only   Metformin And Related Diarrhea, Nausea Only   Oxycodone Nausea And Vomiting   GI Upset (intolerance)   Victoza  [liraglutide] Other (See Comments)   Abdominal pain      Medication List    TAKE these medications   Accu-Chek Aviva Plus test strip Generic drug: glucose blood FOR TESTING BLOOD SUGARS 3 TIMES DAILY. DX:E11.9   Accu-Chek Softclix Lancets lancets Dx:E11.9   aspirin EC 81 MG tablet Take 1 tablet (81 mg total) by mouth daily.   B-D ULTRAFINE III SHORT PEN 31G X 8 MM Misc Generic drug: Insulin Pen Needle USE AS DIRECTED   blood glucose meter kit and supplies Kit Dispense glucometer, test strips, and lancets. Use to check blood sugar twice daily. Dx: E11.9   cholecalciferol 1000 units tablet Commonly known as: VITAMIN D Take 1,000 Units by mouth daily.   ciclopirox 8 % solution Commonly known as: Penlac Apply topically at bedtime. Apply over nail and surrounding skin. Apply daily over previous coat. After seven (7) days, may remove with alcohol and continue cycle.   Dexilant 60 MG capsule Generic drug: dexlansoprazole Take 60 mg by mouth daily.   dicyclomine 20 MG tablet Commonly known as: BENTYL Take 20 mg by mouth 3 (three) times daily.   doxepin 10 MG capsule Commonly known as: SINEQUAN Take 10 mg by mouth at bedtime.   famotidine 20 MG tablet Commonly known as: PEPCID Take 60 mg by mouth at bedtime.   fluticasone 50 MCG/ACT nasal spray Commonly known as: FLONASE PLACE 1 SPRAY INTO BOTH NOSTRILS DAILY. What changed:   how much to take  when to take this  reasons to take this   glipiZIDE 10 MG tablet Commonly known as: GLUCOTROL Take 1 tablet (10 mg total) by mouth 2 (two) times  daily.   Jardiance 25 MG Tabs tablet Generic drug: empagliflozin TAKE 25 MG BY MOUTH DAILY.   Krill Oil 500 MG Caps Take 500 mg by mouth daily.   Lantus SoloStar 100 UNIT/ML Solostar Pen Generic drug: insulin glargine INJECT 38 UNITS INTO THE SKIN AT BEDTIME. What changed: See the new instructions.   levocetirizine 5 MG tablet Commonly known as: XYZAL TAKE 1 TABLET BY  MOUTH EVERY DAY IN THE EVENING What changed:   how much to take  how to take this   MULTIVITAMIN WOMEN 50+ PO Take 1 capsule by mouth daily.   naproxen sodium 220 MG tablet Commonly known as: ALEVE Take 440 mg by mouth daily as needed (pain).   OVER THE COUNTER MEDICATION Take 1 tablet by mouth 3 (three) times daily. Golo otc supplement   oxyCODONE 10 mg 12 hr tablet Commonly known as: OXYCONTIN Take 1 tablet (10 mg total) by mouth every 12 (twelve) hours for 7 days.   perphenazine 8 MG tablet Commonly known as: TRILAFON Take 8 mg by mouth at bedtime.   pravastatin 40 MG tablet Commonly known as: PRAVACHOL TAKE 1 TABLET BY MOUTH EVERYDAY AT BEDTIME What changed:   how much to take  how to take this  when to take this  additional instructions   pregabalin 200 MG capsule Commonly known as: Lyrica Take 1 capsule (200 mg total) by mouth 3 (three) times daily.   tiZANidine 4 MG tablet Commonly known as: ZANAFLEX Take 1 tablet (4 mg total) by mouth every 8 (eight) hours as needed for muscle spasms.   traMADol 50 MG tablet Commonly known as: ULTRAM Take 1 tablet (50 mg total) by mouth every 8 (eight) hours as needed for moderate pain.            Durable Medical Equipment  (From admission, onward)         Start     Ordered   04/17/20 0931  For home use only DME Gilford Rile  Once       Question:  Patient needs a walker to treat with the following condition  Answer:  S/P lumbar spinal fusion   04/17/20 0930          Follow-up Information    Ashok Pall, MD Follow up in 3 week(s).   Specialty: Neurosurgery Why: please call to make an appointment Contact information: [address] [place] [address] [phone]               Signed: Ashok Pall 04/17/2020, 1:48 PM

## 2020-04-17 NOTE — Evaluation (Signed)
Occupational Therapy Evaluation Patient Details Name: Amanda Morris MRN: [id number] DOB: [birth year] Today's Date: 04/17/2020    History of Present Illness 69 year old female with a history of an L4-L5 fusion with Dr. 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  Underwent lumbar three-four Posterior lumbar interbody fusion, Stryker 6mm titanium cages, autograft morsel for the disc space and the cages  Segmental pedicle screw fixation L3-5(Nuvasive)   Clinical Impression   Patient admitted for the above diagnosis and procedure.  She is complaining of continued pain in her low back, but is essentially close to, if not at her baseline.  She has needed hip kit at home, but may need a RW.  PT eval pending.  No further needs in the acute setting.      Follow Up Recommendations  No OT follow up    Equipment Recommendations  None recommended by OT    Recommendations for Other Services       Precautions / Restrictions Precautions Precautions: Back Precaution Booklet Issued: Yes (comment) Precaution Comments: No brace needed Restrictions Weight Bearing Restrictions: No      Mobility Bed Mobility Overal bed mobility: Modified Independent                  Transfers Overall transfer level: Modified independent Equipment used: Rolling walker (2 wheeled)                  Balance Overall balance assessment: Mild deficits observed, not formally tested                                         ADL either performed or assessed with clinical judgement   ADL Overall ADL's : At baseline                                       General ADL Comments: Verbal cues for lower body ADL, but appears to be at or near baseline for ADL, toileting, and in room mobility.     Vision Patient Visual Report: No change from baseline       Perception     Praxis      Pertinent Vitals/Pain Pain Assessment:  Faces Faces Pain Scale: Hurts even more Pain Location: low back Pain Descriptors / Indicators: Aching;Guarding Pain Intervention(s): Monitored during session     Hand Dominance Right   Extremity/Trunk Assessment Upper Extremity Assessment Upper Extremity Assessment: Overall WFL for tasks assessed   Lower Extremity Assessment Lower Extremity Assessment: Defer to PT evaluation       Communication Communication Communication: No difficulties   Cognition Arousal/Alertness: Awake/alert Behavior During Therapy: WFL for tasks assessed/performed Overall Cognitive Status: Within Functional Limits for tasks assessed                                     General Comments   VSS    Exercises     Shoulder Instructions      Home Living Family/patient expects to be discharged to:: Private residence Living Arrangements: Alone Available Help at Discharge: Family;Available PRN/intermittently Type of Home: Apartment Home Access: Level entry     Home Layout: One level     Bathroom Shower/Tub: Teacher, early years/pre: Standard  Home Equipment: Shower seat - built Hotel manager: [employer]        Prior Functioning/Environment Level of Independence: Independent        Comments: works at a Environmental manager Problem List: Pain      OT Treatment/Interventions:      OT Goals(Current goals can be found in the care plan section) Acute Rehab OT Goals Patient Stated Goal: hurt less so I can move better OT Goal Formulation: With patient Time For Goal Achievement: 04/17/20 Potential to Achieve Goals: Good  OT Frequency:     Barriers to D/C:  None noted          Co-evaluation              AM-PAC OT "6 Clicks" Daily Activity     Outcome Measure Help from another person eating meals?: None Help from another person taking care of personal grooming?: None Help from another person toileting, which includes  using toliet, bedpan, or urinal?: None Help from another person bathing (including washing, rinsing, drying)?: None Help from another person to put on and taking off regular upper body clothing?: None Help from another person to put on and taking off regular lower body clothing?: A Little 6 Click Score: 23   End of Session Equipment Utilized During Treatment: Rolling walker Nurse Communication: Mobility status  Activity Tolerance: Patient tolerated treatment well Patient left: in chair;with call bell/phone within reach  OT Visit Diagnosis: Pain Pain - Right/Left:  (back)                Time: 0375-4360 OT Time Calculation (min): 27 min Charges:  OT General Charges $OT Visit: 1 Visit OT Evaluation $OT Eval Moderate Complexity: 1 Mod OT Treatments $Self Care/Home Management : 8-22 mins  04/17/2020  Rich, OTR/L  Acute Rehabilitation Services  Office:  [phone]   Metta Clines 04/17/2020, 8:48 AM

## 2020-04-17 NOTE — Anesthesia Postprocedure Evaluation (Signed)
Anesthesia Post Note  Patient: Amanda Morris  Procedure(s) Performed: Lumbar three-four Posterior lumbar interbody fusion ,arthrodesis segmental posterior instrumentation (N/A )     Patient location during evaluation: PACU Anesthesia Type: General Level of consciousness: awake and alert Pain management: pain level controlled Vital Signs Assessment: post-procedure vital signs reviewed and stable Respiratory status: spontaneous breathing, nonlabored ventilation, respiratory function stable and patient connected to nasal cannula oxygen Cardiovascular status: blood pressure returned to baseline and stable Postop Assessment: no apparent nausea or vomiting Anesthetic complications: no   No complications documented.  Last Vitals:  Vitals:   04/17/20 0344 04/17/20 0733  BP: 117/69 (!) 101/57  Pulse: 84 72  Resp: 20 16  Temp: 36.9 C 36.9 C  SpO2: 94% 97%    Last Pain:  Vitals:   04/17/20 0733  TempSrc: Oral  PainSc:                  March Rummage [place]

## 2020-04-17 NOTE — Discharge Instructions (Addendum)
Wound Care Remove the dressing in 2-3 days Leave incision open to air. You may shower. Do not scrub directly on incision.  Do not put any creams, lotions, or ointments on incision. Activity Walk each and every day, increasing distance each day. No lifting greater than 5 lbs.  Avoid bending, arching, and twisting. No driving for 2 weeks; may ride as a passenger locally. If provided with back brace, wear when out of bed.  It is not necessary to wear in bed. Diet Resume your normal diet.  Return to Work Will be discussed at you follow up appointment. Call Your Doctor If Any of These Occur Redness, drainage, or swelling at the wound.  Temperature greater than 101 degrees. Severe pain not relieved by pain medication. Incision starts to come apart. Follow Up Appt Call today for appointment in 2-3 weeks ([phone]) or for problems.  If you have any hardware placed in your spine, you will need an x-ray before your appointment.     Spinal Fusion Care After Refer to this sheet in the next few weeks. These instructions provide you with information on caring for yourself after your procedure. Your caregiver may also give you more specific instructions. Your treatment has been planned according to current medical practices, but problems sometimes occur. Call your caregiver if you have any problems or questions after your procedure. HOME CARE INSTRUCTIONS   Take whatever pain medicine has been prescribed by your caregiver. Do not take over-the-counter pain medicine unless directed otherwise by your caregiver.   Do not drive if you are taking narcotic pain medicines.   Change your bandage (dressing) if necessary or as directed by your caregiver.   You may shower. The wound may get wet, simply pat the area dry. It will take ~2 weeks for the glue to peel off.  If you have been prescribed medicine to prevent your blood from clotting, follow the directions carefully.   Check the area around your  incision often. Look for redness and swelling. Also, look for anything leaking from your wound. You can use a mirror or have a family member inspect your incision if it is in a place where it is difficult for you to see.   Ask your caregiver what activities you should avoid and for how long.   Walk as much as possible.   Do not lift anything heavier than 5 lbs until your caregiver says it is safe.   Do not twist or bend for a few weeks. Try not to pull on things. Avoid sitting for long periods of time. Change positions at least every hour.

## 2020-04-17 NOTE — Evaluation (Signed)
Physical Therapy Evaluation Patient Details Name: Amanda Morris MRN: [id number] DOB: 02/26/1951 Today's Date: 04/17/2020   History of Present Illness  69 year old female with a history of an L4-L5 fusion with Dr. Franky Macho, failed back surgery syndrome and spinal cord stimulator implantation.  Underwent lumbar three-four Posterior lumbar interbody fusion, Stryker 72mm titanium cages, autograft morsel for the disc space and the cages  Segmental pedicle screw fixation L3-5(Nuvasive)    Clinical Impression  PT eval complete. Pt mod I bed mobility and transfers. Supervision ambulation 250' with RW. Steady gait noted with RW. Pt lives in first floor apartment. She reports her nieces will be assisting her PRN. She will need a RW for home. All education complete. No further PT services indicated. PT signing off.    Follow Up Recommendations No PT follow up;Supervision - Intermittent    Equipment Recommendations  Rolling walker with 5" wheels    Recommendations for Other Services       Precautions / Restrictions Precautions Precautions: Back Precaution Booklet Issued: Yes (comment) Precaution Comments: No brace needed. Reviewed 3/3 back precautions. Pt has handout in room. Restrictions Weight Bearing Restrictions: No      Mobility  Bed Mobility Overal bed mobility: Modified Independent                  Transfers Overall transfer level: Modified independent Equipment used: Rolling walker (2 wheeled)                Ambulation/Gait Ambulation/Gait assistance: Supervision Gait Distance (Feet): 250 Feet Assistive device: Rolling walker (2 wheeled) Gait Pattern/deviations: Step-through pattern;Decreased stride length Gait velocity: decreased Gait velocity interpretation: 1.31 - 2.62 ft/sec, indicative of limited community ambulator General Gait Details: steady gait with RW  Stairs            Wheelchair Mobility    Modified Rankin (Stroke Patients  Only)       Balance Overall balance assessment: Mild deficits observed, not formally tested                                           Pertinent Vitals/Pain Pain Assessment: 0-10 Pain Score: 6  Faces Pain Scale: Hurts even more Pain Location: low back Pain Descriptors / Indicators: Discomfort;Sore Pain Intervention(s): Repositioned;Monitored during session    Home Living Family/patient expects to be discharged to:: Private residence Living Arrangements: Alone Available Help at Discharge: Family;Available PRN/intermittently Type of Home: Apartment Home Access: Level entry     Home Layout: One level Home Equipment: Shower seat - built in;Adaptive equipment      Prior Function Level of Independence: Independent         Comments: works at a [employer]   Dominant Hand: Right    Extremity/Trunk Assessment   Upper Extremity Assessment Upper Extremity Assessment: Overall WFL for tasks assessed    Lower Extremity Assessment Lower Extremity Assessment: Overall WFL for tasks assessed    Cervical / Trunk Assessment Cervical / Trunk Assessment: Other exceptions Cervical / Trunk Exceptions: s/p PLIF  Communication   Communication: No difficulties  Cognition Arousal/Alertness: Awake/alert Behavior During Therapy: WFL for tasks assessed/performed Overall Cognitive Status: Within Functional Limits for tasks assessed  General Comments      Exercises     Assessment/Plan    PT Assessment Patent does not need any further PT services  PT Problem List         PT Treatment Interventions      PT Goals (Current goals can be found in the Care Plan section)  Acute Rehab PT Goals Patient Stated Goal: home PT Goal Formulation: All assessment and education complete, DC therapy    Frequency     Barriers to discharge        Co-evaluation               AM-PAC PT  "6 Clicks" Mobility  Outcome Measure Help needed turning from your back to your side while in a flat bed without using bedrails?: None Help needed moving from lying on your back to sitting on the side of a flat bed without using bedrails?: None Help needed moving to and from a bed to a chair (including a wheelchair)?: None Help needed standing up from a chair using your arms (e.g., wheelchair or bedside chair)?: None Help needed to walk in hospital room?: A Little Help needed climbing 3-5 steps with a railing? : A Little 6 Click Score: 22    End of Session Equipment Utilized During Treatment: Gait belt Activity Tolerance: Patient tolerated treatment well Patient left: in chair;with call bell/phone within reach Nurse Communication: Mobility status PT Visit Diagnosis: Difficulty in walking, not elsewhere classified (R26.2);Pain    Time: 9150-4136 PT Time Calculation (min) (ACUTE ONLY): 11 min   Charges:   PT Evaluation $PT Eval Moderate Complexity: 1 Mod          Lorrin Goodell, PT  Office # [phone] Pager [phone]   Amanda Morris 04/17/2020, 9:31 AM

## 2020-04-17 NOTE — Progress Notes (Signed)
Patient alert and oriented, mae's well, voiding adequate amount of urine, swallowing without difficulty, no c/o pain at time of discharge. Patient discharged home with family. Script and discharged instructions given to patient. Patient and family stated understanding of instructions given. Patient has an appointment with Dr. Cabbell   

## 2020-04-28 ENCOUNTER — Other Ambulatory Visit: Payer: Self-pay | Admitting: Neurosurgery

## 2020-04-28 DIAGNOSIS — M4316 Spondylolisthesis, lumbar region: Secondary | ICD-10-CM

## 2020-05-05 ENCOUNTER — Other Ambulatory Visit: Payer: Self-pay | Admitting: Sports Medicine

## 2020-05-06 DIAGNOSIS — M4316 Spondylolisthesis, lumbar region: Secondary | ICD-10-CM | POA: Diagnosis not present

## 2020-05-06 DIAGNOSIS — R030 Elevated blood-pressure reading, without diagnosis of hypertension: Secondary | ICD-10-CM | POA: Insufficient documentation

## 2020-05-12 ENCOUNTER — Ambulatory Visit: Payer: Medicare Other | Admitting: Family Medicine

## 2020-05-14 ENCOUNTER — Encounter: Payer: Self-pay | Admitting: Physical Therapy

## 2020-05-14 ENCOUNTER — Ambulatory Visit (INDEPENDENT_AMBULATORY_CARE_PROVIDER_SITE_OTHER): Payer: Medicare Other | Admitting: Physical Therapy

## 2020-05-14 ENCOUNTER — Other Ambulatory Visit: Payer: Self-pay

## 2020-05-14 DIAGNOSIS — M545 Low back pain, unspecified: Secondary | ICD-10-CM

## 2020-05-14 DIAGNOSIS — R2689 Other abnormalities of gait and mobility: Secondary | ICD-10-CM | POA: Diagnosis not present

## 2020-05-14 DIAGNOSIS — R531 Weakness: Secondary | ICD-10-CM

## 2020-05-14 NOTE — Patient Instructions (Signed)
Access Code: [email or web address] URL: [email or web address] Date: 05/14/2020 Prepared by: Isabelle Course  Exercises Lower Trunk Rotations - 1 x daily - 7 x weekly - 2 sets - 10 reps Supine Heel Slide - 1 x daily - 7 x weekly - 2 sets - 10 reps Seated Long Arc Quad - 1 x daily - 7 x weekly - 2 sets - 10 reps
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
## 2020-05-16 ENCOUNTER — Other Ambulatory Visit: Payer: Self-pay

## 2020-05-16 ENCOUNTER — Ambulatory Visit (INDEPENDENT_AMBULATORY_CARE_PROVIDER_SITE_OTHER): Payer: Medicare Other | Admitting: Sports Medicine

## 2020-05-16 DIAGNOSIS — Z981 Arthrodesis status: Secondary | ICD-10-CM | POA: Diagnosis not present

## 2020-05-16 DIAGNOSIS — R718 Other abnormality of red blood cells: Secondary | ICD-10-CM | POA: Diagnosis not present

## 2020-05-16 MED ORDER — DOXYCYCLINE HYCLATE 100 MG PO TABS: 100.0000 mg | ORAL_TABLET | Freq: Two times a day (BID) | ORAL | 0 refills | Status: DC

## 2020-05-16 NOTE — Assessment & Plan Note (Addendum)
Amanda Morris returns, she is a very pleasant 70 year old female with a history of an L4-L5 fusion by Dr. Christella Noa, failed back surgery syndrome, spinal cord stimulator implantation. After failure of conservative treatment we obtained a CT myelography that showed the fusion level looks good but adjacent level disease at L3-L4.  She did see Dr. Gloriann Loan who ultimately planned extension of the fusion to L3-L4. Fusion went without a hitch, unfortunately she is starting to have increasing pain in her lumbar spine with some warmth, purulence. She is tachycardic with a temperature over 100 degrees. Unfortunately she does have a wound infection with some dehiscence. She really needs to get back in with Dr. Christella Noa or one of his partners, I am going to add doxycycline, and a referral to wound care, as well as a CT with contrast to ensure no collections that are drainable. I do suspect this will need to heal by secondary intention and/or have a wound VAC. Further follow-up needs to be with her surgeon. Of note BUN and creatinine was normal a month ago.

## 2020-05-16 NOTE — Progress Notes (Signed)
    Procedures performed today:    None.  Independent interpretation of notes and tests performed by another provider:   None.  Brief History, Exam, Impression, and Recommendations:    History of lumbar fusion Amanda Morris returns, she is a very pleasant 70 year old female with a history of an L4-L5 fusion by Dr. Christella Noa, failed back surgery syndrome, spinal cord stimulator implantation. After failure of conservative treatment we obtained a CT myelography that showed the fusion level looks good but adjacent level disease at L3-L4.  She did see Dr. Gloriann Loan who ultimately planned extension of the fusion to L3-L4. Fusion went without a hitch, unfortunately she is starting to have increasing pain in her lumbar spine with some warmth, purulence. She is tachycardic with a temperature over 100 degrees. Unfortunately she does have a wound infection with some dehiscence. She really needs to get back in with Dr. Christella Noa or one of his partners, I am going to add doxycycline, and a referral to wound care, as well as a CT with contrast to ensure no collections that are drainable. I do suspect this will need to heal by secondary intention and/or have a wound VAC. Further follow-up needs to be with her surgeon. Of note BUN and creatinine was normal a month ago.    ___________________________________________ Amanda Morris, M.D., ABFM., CAQSM. Primary Care and [place] Instructor of [place] [employer] [facility] [employer]

## 2020-05-19 ENCOUNTER — Other Ambulatory Visit: Payer: Self-pay | Admitting: Family Medicine

## 2020-05-19 ENCOUNTER — Other Ambulatory Visit: Payer: Self-pay

## 2020-05-19 ENCOUNTER — Ambulatory Visit: Payer: Medicare Other | Admitting: Neurology

## 2020-05-19 ENCOUNTER — Ambulatory Visit (INDEPENDENT_AMBULATORY_CARE_PROVIDER_SITE_OTHER): Payer: Medicare Other

## 2020-05-19 DIAGNOSIS — M48061 Spinal stenosis, lumbar region without neurogenic claudication: Secondary | ICD-10-CM | POA: Diagnosis not present

## 2020-05-19 DIAGNOSIS — Z981 Arthrodesis status: Secondary | ICD-10-CM

## 2020-05-19 DIAGNOSIS — M5136 Other intervertebral disc degeneration, lumbar region: Secondary | ICD-10-CM

## 2020-05-19 DIAGNOSIS — E1169 Type 2 diabetes mellitus with other specified complication: Secondary | ICD-10-CM

## 2020-05-19 DIAGNOSIS — I10 Essential (primary) hypertension: Secondary | ICD-10-CM

## 2020-05-19 DIAGNOSIS — M5126 Other intervertebral disc displacement, lumbar region: Secondary | ICD-10-CM | POA: Diagnosis not present

## 2020-05-19 DIAGNOSIS — M4316 Spondylolisthesis, lumbar region: Secondary | ICD-10-CM | POA: Diagnosis not present

## 2020-05-19 MED ORDER — IOHEXOL 300 MG/ML  SOLN
100.0000 mL | Freq: Once | INTRAMUSCULAR | Status: AC | PRN
  Administered 2020-05-19: 100 mL via INTRAVENOUS

## 2020-05-20 ENCOUNTER — Encounter: Payer: Medicare Other | Admitting: Physical Therapy

## 2020-05-21 ENCOUNTER — Encounter (HOSPITAL_COMMUNITY): Payer: Self-pay | Admitting: Neurosurgery

## 2020-05-21 ENCOUNTER — Other Ambulatory Visit: Payer: Self-pay | Admitting: Neurosurgery

## 2020-05-21 DIAGNOSIS — K589 Irritable bowel syndrome without diarrhea: Secondary | ICD-10-CM | POA: Diagnosis not present

## 2020-05-21 DIAGNOSIS — R109 Unspecified abdominal pain: Secondary | ICD-10-CM | POA: Diagnosis not present

## 2020-05-21 DIAGNOSIS — R110 Nausea: Secondary | ICD-10-CM | POA: Diagnosis not present

## 2020-05-21 DIAGNOSIS — Z86010 Personal history of colonic polyps: Secondary | ICD-10-CM | POA: Diagnosis not present

## 2020-05-21 DIAGNOSIS — R1013 Epigastric pain: Secondary | ICD-10-CM | POA: Diagnosis not present

## 2020-05-21 NOTE — Progress Notes (Signed)
Late add on case.  Call patient with instructions for day of surgery.  Covid test on DOS. EKG needed on DOS.  No diabetes medication (Glipizide, Jardiance) on DOS.  Do not take glipizide Wed night dose. Take 17 units Lantus Insulin Wed night dose.  Patient verbalized understanding of instructions given via phone.

## 2020-05-22 ENCOUNTER — Encounter (HOSPITAL_COMMUNITY)
Admission: RE | Disposition: A | Discharge status: Home / Self Care | Payer: Self-pay | Source: Home / Self Care | Attending: Neurosurgery

## 2020-05-22 ENCOUNTER — Encounter (HOSPITAL_COMMUNITY): Payer: Self-pay | Admitting: Neurosurgery

## 2020-05-22 ENCOUNTER — Other Ambulatory Visit: Payer: Self-pay

## 2020-05-22 ENCOUNTER — Observation Stay (HOSPITAL_COMMUNITY)
Admission: RE | Admit: 2020-05-22 | Discharge: 2020-05-23 | Disposition: A | Discharge status: Home / Self Care | Payer: Medicare Other | Attending: Neurosurgery | Admitting: Neurosurgery

## 2020-05-22 ENCOUNTER — Ambulatory Visit (HOSPITAL_COMMUNITY): Payer: Medicare Other | Admitting: Certified Registered"

## 2020-05-22 ENCOUNTER — Encounter: Payer: Medicare Other | Admitting: Physical Therapy

## 2020-05-22 DIAGNOSIS — T8149XA Infection following a procedure, other surgical site, initial encounter: Secondary | ICD-10-CM | POA: Diagnosis not present

## 2020-05-22 DIAGNOSIS — E119 Type 2 diabetes mellitus without complications: Secondary | ICD-10-CM | POA: Insufficient documentation

## 2020-05-22 DIAGNOSIS — T8141XA Infection following a procedure, superficial incisional surgical site, initial encounter: Secondary | ICD-10-CM | POA: Diagnosis not present

## 2020-05-22 DIAGNOSIS — Z981 Arthrodesis status: Secondary | ICD-10-CM

## 2020-05-22 DIAGNOSIS — Z7984 Long term (current) use of oral hypoglycemic drugs: Secondary | ICD-10-CM | POA: Insufficient documentation

## 2020-05-22 DIAGNOSIS — Z20822 Contact with and (suspected) exposure to covid-19: Secondary | ICD-10-CM | POA: Diagnosis not present

## 2020-05-22 DIAGNOSIS — Z7982 Long term (current) use of aspirin: Secondary | ICD-10-CM | POA: Insufficient documentation

## 2020-05-22 DIAGNOSIS — Y848 Other medical procedures as the cause of abnormal reaction of the patient, or of later complication, without mention of misadventure at the time of the procedure: Secondary | ICD-10-CM | POA: Diagnosis not present

## 2020-05-22 DIAGNOSIS — Z79899 Other long term (current) drug therapy: Secondary | ICD-10-CM | POA: Insufficient documentation

## 2020-05-22 DIAGNOSIS — Z87891 Personal history of nicotine dependence: Secondary | ICD-10-CM | POA: Insufficient documentation

## 2020-05-22 DIAGNOSIS — I10 Essential (primary) hypertension: Secondary | ICD-10-CM | POA: Diagnosis not present

## 2020-05-22 DIAGNOSIS — T148XXA Other injury of unspecified body region, initial encounter: Secondary | ICD-10-CM | POA: Diagnosis present

## 2020-05-22 DIAGNOSIS — L089 Local infection of the skin and subcutaneous tissue, unspecified: Secondary | ICD-10-CM | POA: Diagnosis present

## 2020-05-22 DIAGNOSIS — M4636 Infection of intervertebral disc (pyogenic), lumbar region: Secondary | ICD-10-CM | POA: Diagnosis not present

## 2020-05-22 HISTORY — PX: LUMBAR WOUND DEBRIDEMENT: SHX1988

## 2020-05-22 LAB — CBC WITH DIFFERENTIAL/PLATELET
Absolute Monocytes: 1082 cells/uL — ABNORMAL HIGH (ref 200–950)
Basophils Absolute: 72 cells/uL (ref 0–200)
Basophils Relative: 0.7 %
Eosinophils Absolute: 124 cells/uL (ref 15–500)
Eosinophils Relative: 1.2 %
HCT: 42.2 % (ref 35.0–45.0)
Hemoglobin: 12.8 g/dL (ref 11.7–15.5)
Lymphs Abs: 2812 cells/uL (ref 850–3900)
MCH: 22.7 pg — ABNORMAL LOW (ref 27.0–33.0)
MCHC: 30.3 g/dL — ABNORMAL LOW (ref 32.0–36.0)
MCV: 74.7 fL — ABNORMAL LOW (ref 80.0–100.0)
MPV: 12.2 fL (ref 7.5–12.5)
Monocytes Relative: 10.5 %
Neutro Abs: 6211 cells/uL (ref 1500–7800)
Neutrophils Relative %: 60.3 %
Platelets: 287 10*3/uL (ref 140–400)
RBC: 5.65 10*6/uL — ABNORMAL HIGH (ref 3.80–5.10)
RDW: 14.8 % (ref 11.0–15.0)
Total Lymphocyte: 27.3 %
WBC: 10.3 10*3/uL (ref 3.8–10.8)

## 2020-05-22 LAB — GLUCOSE, CAPILLARY
Glucose-Capillary: 107 mg/dL — ABNORMAL HIGH (ref 70–99)
Glucose-Capillary: 68 mg/dL — ABNORMAL LOW (ref 70–99)
Glucose-Capillary: 148 mg/dL — ABNORMAL HIGH (ref 70–99)
Glucose-Capillary: 132 mg/dL — ABNORMAL HIGH (ref 70–99)
Glucose-Capillary: 165 mg/dL — ABNORMAL HIGH (ref 70–99)

## 2020-05-22 LAB — TYPE AND SCREEN
ABO/RH(D): B NEG
Antibody Screen: NEGATIVE

## 2020-05-22 LAB — COMPREHENSIVE METABOLIC PANEL
AG Ratio: 1.3 (calc) (ref 1.0–2.5)
ALT: 14 U/L (ref 6–29)
AST: 20 U/L (ref 10–35)
Albumin: 3.9 g/dL (ref 3.6–5.1)
Alkaline phosphatase (APISO): 103 U/L (ref 37–153)
BUN: 8 mg/dL (ref 7–25)
CO2: 25 mmol/L (ref 20–32)
Calcium: 9.1 mg/dL (ref 8.6–10.4)
Chloride: 105 mmol/L (ref 98–110)
Creat: 0.73 mg/dL (ref 0.50–0.99)
Globulin: 3.1 g/dL (calc) (ref 1.9–3.7)
Glucose, Bld: 113 mg/dL — ABNORMAL HIGH (ref 65–99)
Potassium: 4.2 mmol/L (ref 3.5–5.3)
Sodium: 142 mmol/L (ref 135–146)
Total Bilirubin: 0.4 mg/dL (ref 0.2–1.2)
Total Protein: 7 g/dL (ref 6.1–8.1)

## 2020-05-22 LAB — CULTURE, BLOOD (SINGLE): MICRO NUMBER:: 11447903

## 2020-05-22 LAB — SARS CORONAVIRUS 2 BY RT PCR (HOSPITAL ORDER, PERFORMED IN ~~LOC~~ HOSPITAL LAB): SARS Coronavirus 2: NEGATIVE

## 2020-05-22 SURGERY — LUMBAR WOUND DEBRIDEMENT
Anesthesia: General | Site: Spine Lumbar

## 2020-05-22 MED ORDER — PREGABALIN 100 MG PO CAPS
200.0000 mg | ORAL_CAPSULE | Freq: Three times a day (TID) | ORAL | Status: DC
  Administered 2020-05-22 – 2020-05-23 (×3): 200 mg via ORAL
  Filled 2020-05-22 (×3): qty 2

## 2020-05-22 MED ORDER — PANTOPRAZOLE SODIUM 40 MG PO TBEC
40.0000 mg | DELAYED_RELEASE_TABLET | Freq: Every day | ORAL | Status: DC
  Administered 2020-05-22 – 2020-05-23 (×2): 40 mg via ORAL
  Filled 2020-05-22 (×2): qty 1

## 2020-05-22 MED ORDER — DOXYCYCLINE HYCLATE 100 MG PO TABS: 100.0000 mg | ORAL_TABLET | Freq: Two times a day (BID) | ORAL | 0 refills | Status: AC

## 2020-05-22 MED ORDER — LIDOCAINE 2% (20 MG/ML) 5 ML SYRINGE
INTRAMUSCULAR | Status: DC | PRN
  Administered 2020-05-22: 60 mg via INTRAVENOUS

## 2020-05-22 MED ORDER — ASPIRIN EC 81 MG PO TBEC
81.0000 mg | DELAYED_RELEASE_TABLET | Freq: Every day | ORAL | Status: DC
  Administered 2020-05-23: 81 mg via ORAL
  Filled 2020-05-22 (×2): qty 1

## 2020-05-22 MED ORDER — ROCURONIUM BROMIDE 10 MG/ML (PF) SYRINGE
PREFILLED_SYRINGE | INTRAVENOUS | Status: DC | PRN
  Administered 2020-05-22: 40 mg via INTRAVENOUS

## 2020-05-22 MED ORDER — BUPIVACAINE HCL (PF) 0.25 % IJ SOLN
INTRAMUSCULAR | Status: AC
  Filled 2020-05-22: qty 30

## 2020-05-22 MED ORDER — BACITRACIN ZINC 500 UNIT/GM EX OINT
TOPICAL_OINTMENT | CUTANEOUS | Status: AC
  Filled 2020-05-22: qty 28.35

## 2020-05-22 MED ORDER — PRAVASTATIN SODIUM 40 MG PO TABS
40.0000 mg | ORAL_TABLET | Freq: Every day | ORAL | Status: DC
  Administered 2020-05-22: 40 mg via ORAL
  Filled 2020-05-22 (×2): qty 1

## 2020-05-22 MED ORDER — HEMOSTATIC AGENTS (NO CHARGE) OPTIME
TOPICAL | Status: DC | PRN
  Administered 2020-05-22: 1 via TOPICAL

## 2020-05-22 MED ORDER — DEXAMETHASONE SODIUM PHOSPHATE 10 MG/ML IJ SOLN
INTRAMUSCULAR | Status: DC | PRN
  Administered 2020-05-22: 4 mg via INTRAVENOUS

## 2020-05-22 MED ORDER — FENTANYL CITRATE (PF) 250 MCG/5ML IJ SOLN
INTRAMUSCULAR | Status: DC | PRN
  Administered 2020-05-22 (×2): 50 ug via INTRAVENOUS

## 2020-05-22 MED ORDER — VANCOMYCIN HCL IN DEXTROSE 1-5 GM/200ML-% IV SOLN
1000.0000 mg | INTRAVENOUS | Status: AC
  Administered 2020-05-22: 1000 mg via INTRAVENOUS
  Filled 2020-05-22: qty 200

## 2020-05-22 MED ORDER — FENTANYL CITRATE (PF) 100 MCG/2ML IJ SOLN
INTRAMUSCULAR | Status: AC
  Filled 2020-05-22: qty 2

## 2020-05-22 MED ORDER — INSULIN ASPART 100 UNIT/ML ~~LOC~~ SOLN
0.0000 [IU] | SUBCUTANEOUS | Status: DC
  Administered 2020-05-22: 3 [IU] via SUBCUTANEOUS
  Filled 2020-05-22: qty 0.15

## 2020-05-22 MED ORDER — PROMETHAZINE HCL 25 MG/ML IJ SOLN
6.2500 mg | Freq: Once | INTRAMUSCULAR | Status: AC | PRN
  Administered 2020-05-22: 6.25 mg via INTRAVENOUS

## 2020-05-22 MED ORDER — VANCOMYCIN HCL 1000 MG IV SOLR
INTRAVENOUS | Status: AC
  Filled 2020-05-22: qty 1000

## 2020-05-22 MED ORDER — FENTANYL CITRATE (PF) 100 MCG/2ML IJ SOLN
25.0000 ug | INTRAMUSCULAR | Status: DC | PRN
  Administered 2020-05-22 (×2): 50 ug via INTRAVENOUS

## 2020-05-22 MED ORDER — DICYCLOMINE HCL 20 MG PO TABS
20.0000 mg | ORAL_TABLET | Freq: Three times a day (TID) | ORAL | Status: DC
  Administered 2020-05-23 (×2): 20 mg via ORAL
  Filled 2020-05-22 (×4): qty 1

## 2020-05-22 MED ORDER — CHLORHEXIDINE GLUCONATE CLOTH 2 % EX PADS: 6.0000 | MEDICATED_PAD | Freq: Once | CUTANEOUS | Status: DC

## 2020-05-22 MED ORDER — ORAL CARE MOUTH RINSE: 15.0000 mL | Freq: Once | OROMUCOSAL | Status: AC

## 2020-05-22 MED ORDER — MIDAZOLAM HCL 2 MG/2ML IJ SOLN
INTRAMUSCULAR | Status: AC
  Filled 2020-05-22: qty 2

## 2020-05-22 MED ORDER — DOXEPIN HCL 10 MG PO CAPS
10.0000 mg | ORAL_CAPSULE | Freq: Every day | ORAL | Status: DC
  Administered 2020-05-22: 10 mg via ORAL
  Filled 2020-05-22 (×2): qty 1

## 2020-05-22 MED ORDER — LEVOCETIRIZINE DIHYDROCHLORIDE 5 MG PO TABS: 5.0000 mg | ORAL_TABLET | Freq: Every evening | ORAL | Status: DC

## 2020-05-22 MED ORDER — ACETAMINOPHEN 500 MG PO TABS
1000.0000 mg | ORAL_TABLET | Freq: Once | ORAL | Status: AC
  Administered 2020-05-22: 1000 mg via ORAL
  Filled 2020-05-22: qty 2

## 2020-05-22 MED ORDER — PROMETHAZINE HCL 25 MG/ML IJ SOLN
INTRAMUSCULAR | Status: AC
  Filled 2020-05-22: qty 1

## 2020-05-22 MED ORDER — AMISULPRIDE (ANTIEMETIC) 5 MG/2ML IV SOLN
5.0000 mg | Freq: Once | INTRAVENOUS | Status: AC
  Administered 2020-05-22: 5 mg via INTRAVENOUS

## 2020-05-22 MED ORDER — LACTATED RINGERS IV SOLN: INTRAVENOUS | Status: DC

## 2020-05-22 MED ORDER — MIDAZOLAM HCL 5 MG/5ML IJ SOLN
INTRAMUSCULAR | Status: DC | PRN
  Administered 2020-05-22: 1 mg via INTRAVENOUS

## 2020-05-22 MED ORDER — BACITRACIN ZINC 500 UNIT/GM EX OINT
TOPICAL_OINTMENT | CUTANEOUS | Status: DC | PRN
  Administered 2020-05-22: 1 via TOPICAL

## 2020-05-22 MED ORDER — DEXTROSE 50 % IV SOLN
INTRAVENOUS | Status: AC
  Administered 2020-05-22: 12.5 g via INTRAVENOUS
  Filled 2020-05-22: qty 50

## 2020-05-22 MED ORDER — 0.9 % SODIUM CHLORIDE (POUR BTL) OPTIME
TOPICAL | Status: DC | PRN
  Administered 2020-05-22: 1000 mL

## 2020-05-22 MED ORDER — PROPOFOL 10 MG/ML IV BOLUS
INTRAVENOUS | Status: AC
  Filled 2020-05-22: qty 20

## 2020-05-22 MED ORDER — EMPAGLIFLOZIN 25 MG PO TABS
25.0000 mg | ORAL_TABLET | Freq: Every day | ORAL | Status: DC
  Administered 2020-05-22 – 2020-05-23 (×2): 25 mg via ORAL
  Filled 2020-05-22 (×2): qty 1

## 2020-05-22 MED ORDER — FAMOTIDINE 20 MG PO TABS
40.0000 mg | ORAL_TABLET | Freq: Every day | ORAL | Status: DC
  Administered 2020-05-22: 40 mg via ORAL
  Filled 2020-05-22: qty 2

## 2020-05-22 MED ORDER — SUGAMMADEX SODIUM 200 MG/2ML IV SOLN
INTRAVENOUS | Status: DC | PRN
  Administered 2020-05-22: 200 mg via INTRAVENOUS
  Administered 2020-05-22: 100 mg via INTRAVENOUS

## 2020-05-22 MED ORDER — FENTANYL CITRATE (PF) 250 MCG/5ML IJ SOLN
INTRAMUSCULAR | Status: AC
  Filled 2020-05-22: qty 5

## 2020-05-22 MED ORDER — GLIPIZIDE 10 MG PO TABS
10.0000 mg | ORAL_TABLET | Freq: Two times a day (BID) | ORAL | Status: DC
  Administered 2020-05-23: 10 mg via ORAL
  Filled 2020-05-22 (×2): qty 1
  Filled 2020-05-22: qty 2
  Filled 2020-05-22: qty 1

## 2020-05-22 MED ORDER — AMISULPRIDE (ANTIEMETIC) 5 MG/2ML IV SOLN
INTRAVENOUS | Status: AC
  Filled 2020-05-22: qty 2

## 2020-05-22 MED ORDER — ONDANSETRON HCL 4 MG/2ML IJ SOLN
INTRAMUSCULAR | Status: DC | PRN
  Administered 2020-05-22: 4 mg via INTRAVENOUS

## 2020-05-22 MED ORDER — PERPHENAZINE 4 MG PO TABS
8.0000 mg | ORAL_TABLET | Freq: Every day | ORAL | Status: DC
  Administered 2020-05-22: 8 mg via ORAL
  Filled 2020-05-22: qty 2
  Filled 2020-05-22 (×2): qty 1
  Filled 2020-05-22: qty 2

## 2020-05-22 MED ORDER — VITAMIN D 25 MCG (1000 UNIT) PO TABS
1000.0000 [IU] | ORAL_TABLET | Freq: Every day | ORAL | Status: DC
  Administered 2020-05-22 – 2020-05-23 (×2): 1000 [IU] via ORAL
  Filled 2020-05-22 (×2): qty 1

## 2020-05-22 MED ORDER — THROMBIN 5000 UNITS EX SOLR
CUTANEOUS | Status: AC
  Filled 2020-05-22: qty 15000

## 2020-05-22 MED ORDER — VANCOMYCIN HCL IN DEXTROSE 1-5 GM/200ML-% IV SOLN
1000.0000 mg | Freq: Two times a day (BID) | INTRAVENOUS | Status: DC
  Administered 2020-05-23: 1000 mg via INTRAVENOUS
  Filled 2020-05-22: qty 200

## 2020-05-22 MED ORDER — BUPIVACAINE HCL (PF) 0.25 % IJ SOLN
INTRAMUSCULAR | Status: DC | PRN
Start: 2020-05-22 — End: 2020-05-22
  Administered 2020-05-22: 30 mL

## 2020-05-22 MED ORDER — THROMBIN 5000 UNITS EX SOLR
CUTANEOUS | Status: DC | PRN
  Administered 2020-05-22 (×2): 5000 [IU] via TOPICAL

## 2020-05-22 MED ORDER — DEXTROSE 50 % IV SOLN: 12.5000 g | Freq: Once | INTRAVENOUS | Status: AC

## 2020-05-22 MED ORDER — INSULIN GLARGINE 100 UNIT/ML ~~LOC~~ SOLN
34.0000 [IU] | Freq: Every day | SUBCUTANEOUS | Status: DC
  Administered 2020-05-22: 34 [IU] via SUBCUTANEOUS
  Filled 2020-05-22 (×3): qty 0.34

## 2020-05-22 MED ORDER — TRAMADOL HCL 50 MG PO TABS
50.0000 mg | ORAL_TABLET | Freq: Four times a day (QID) | ORAL | Status: DC | PRN
  Administered 2020-05-22 – 2020-05-23 (×3): 50 mg via ORAL
  Filled 2020-05-22 (×3): qty 1

## 2020-05-22 MED ORDER — PROPOFOL 10 MG/ML IV BOLUS
INTRAVENOUS | Status: DC | PRN
  Administered 2020-05-22: 100 mg via INTRAVENOUS

## 2020-05-22 MED ORDER — CHLORHEXIDINE GLUCONATE 0.12 % MT SOLN
15.0000 mL | Freq: Once | OROMUCOSAL | Status: AC
  Administered 2020-05-22: 15 mL via OROMUCOSAL
  Filled 2020-05-22: qty 15

## 2020-05-22 MED ORDER — FLUTICASONE PROPIONATE 50 MCG/ACT NA SUSP
2.0000 | Freq: Two times a day (BID) | NASAL | Status: DC | PRN
  Filled 2020-05-22: qty 16

## 2020-05-22 SURGICAL SUPPLY — 59 items
BENZOIN TINCTURE PRP APPL 2/3 (GAUZE/BANDAGES/DRESSINGS) IMPLANT
BLADE CLIPPER SURG (BLADE) IMPLANT
CANISTER SUCT 3000ML PPV (MISCELLANEOUS) ×2 IMPLANT
CARTRIDGE OIL MAESTRO DRILL (MISCELLANEOUS) IMPLANT
COVER WAND RF STERILE (DRAPES) ×2 IMPLANT
DECANTER SPIKE VIAL GLASS SM (MISCELLANEOUS) ×2 IMPLANT
DIFFUSER DRILL AIR PNEUMATIC (MISCELLANEOUS) IMPLANT
DRAPE LAPAROTOMY 100X72X124 (DRAPES) ×2 IMPLANT
DRAPE SURG 17X23 STRL (DRAPES) ×2 IMPLANT
DRSG OPSITE POSTOP 4X6 (GAUZE/BANDAGES/DRESSINGS) ×2 IMPLANT
DURAPREP 26ML APPLICATOR (WOUND CARE) ×2 IMPLANT
ELECT REM PT RETURN 9FT ADLT (ELECTROSURGICAL) ×2
ELECTRODE REM PT RTRN 9FT ADLT (ELECTROSURGICAL) ×1 IMPLANT
GAUZE 4X4 16PLY RFD (DISPOSABLE) IMPLANT
GAUZE SPONGE 4X4 12PLY STRL (GAUZE/BANDAGES/DRESSINGS) IMPLANT
GLOVE BIO SURGEON STRL SZ 6.5 (GLOVE) IMPLANT
GLOVE BIO SURGEON STRL SZ7 (GLOVE) IMPLANT
GLOVE BIO SURGEON STRL SZ7.5 (GLOVE) IMPLANT
GLOVE BIO SURGEON STRL SZ8 (GLOVE) IMPLANT
GLOVE BIO SURGEON STRL SZ8.5 (GLOVE) IMPLANT
GLOVE BIOGEL M 8.0 STRL (GLOVE) IMPLANT
GLOVE ECLIPSE 6.5 STRL STRAW (GLOVE) ×2 IMPLANT
GLOVE ECLIPSE 7.0 STRL STRAW (GLOVE) IMPLANT
GLOVE ECLIPSE 7.5 STRL STRAW (GLOVE) ×8 IMPLANT
GLOVE ECLIPSE 8.0 STRL XLNG CF (GLOVE) IMPLANT
GLOVE ECLIPSE 8.5 STRL (GLOVE) IMPLANT
GLOVE EXAM NITRILE XL STR (GLOVE) IMPLANT
GLOVE INDICATOR 7.0 STRL GRN (GLOVE) IMPLANT
GLOVE INDICATOR 7.5 STRL GRN (GLOVE) IMPLANT
GLOVE INDICATOR 8.0 STRL GRN (GLOVE) ×2 IMPLANT
GLOVE INDICATOR 8.5 STRL (GLOVE) IMPLANT
GLOVE OPTIFIT SS 8.0 STRL (GLOVE) IMPLANT
GLOVE SURG SS PI 6.5 STRL IVOR (GLOVE) IMPLANT
GLOVE SURG UNDER LTX SZ6.5 (GLOVE) IMPLANT
GOWN STRL REUS W/ TWL LRG LVL3 (GOWN DISPOSABLE) ×1 IMPLANT
GOWN STRL REUS W/ TWL XL LVL3 (GOWN DISPOSABLE) IMPLANT
GOWN STRL REUS W/TWL 2XL LVL3 (GOWN DISPOSABLE) ×2 IMPLANT
GOWN STRL REUS W/TWL LRG LVL3 (GOWN DISPOSABLE) ×1
GOWN STRL REUS W/TWL XL LVL3 (GOWN DISPOSABLE)
KIT BASIN OR (CUSTOM PROCEDURE TRAY) ×2 IMPLANT
KIT TURNOVER KIT B (KITS) ×2 IMPLANT
NEEDLE HYPO 22GX1.5 SAFETY (NEEDLE) ×2 IMPLANT
NS IRRIG 1000ML POUR BTL (IV SOLUTION) ×2 IMPLANT
OIL CARTRIDGE MAESTRO DRILL (MISCELLANEOUS)
PACK LAMINECTOMY NEURO (CUSTOM PROCEDURE TRAY) ×2 IMPLANT
PAD ARMBOARD 7.5X6 YLW CONV (MISCELLANEOUS) ×6 IMPLANT
SPONGE LAP 4X18 RFD (DISPOSABLE) IMPLANT
SPONGE SURGIFOAM ABS GEL SZ50 (HEMOSTASIS) ×2 IMPLANT
STRIP CLOSURE SKIN 1/2X4 (GAUZE/BANDAGES/DRESSINGS) IMPLANT
SUT ETHILON 2 0 FS 18 (SUTURE) ×4 IMPLANT
SUT VIC AB 0 CT1 18XCR BRD8 (SUTURE) ×1 IMPLANT
SUT VIC AB 0 CT1 8-18 (SUTURE) ×1
SUT VIC AB 2-0 CT1 18 (SUTURE) ×2 IMPLANT
SUT VIC AB 3-0 SH 8-18 (SUTURE) ×2 IMPLANT
SWAB COLLECTION DEVICE MRSA (MISCELLANEOUS) IMPLANT
SWAB CULTURE ESWAB REG 1ML (MISCELLANEOUS) IMPLANT
TOWEL GREEN STERILE (TOWEL DISPOSABLE) ×2 IMPLANT
TOWEL GREEN STERILE FF (TOWEL DISPOSABLE) ×2 IMPLANT
WATER STERILE IRR 1000ML POUR (IV SOLUTION) ×2 IMPLANT

## 2020-05-22 NOTE — Progress Notes (Signed)
An additional dose of suggamadex given by crna, and immediately  Strength improved, wob improved and notable change in her elevated BP

## 2020-05-22 NOTE — H&P (Signed)
BP 124/69   Pulse 84   Temp 97.6 F (36.4 C) (Oral)   Resp 18   Ht 5' 2.5" (1.588 m)   Wt 91.2 kg   LMP  (LMP Unknown)   SpO2 99%   BMI 36.18 kg/m  Amanda Morris presents after reporting purulent drainage from her lumbar wound.  She recently had an extension of an existing arthrodesis in the last  Allergies  Allergen Reactions  . Penicillins Hives and Other (See Comments)    PATIENT HAS HAD A PCN REACTION WITH IMMEDIATE RASH, FACIAL/TONGUE/THROAT SWELLING, SOB, OR LIGHTHEADEDNESS WITH HYPOTENSION:  #  #  YES  #  #  Has patient had a PCN reaction causing severe rash involving mucus membranes or skin necrosis: No Has patient had a PCN reaction that required hospitalization: No Has patient had a PCN reaction occurring within the last 10 years: No If all of the above answers are "NO", then may proceed with Cephalosporin use.   . Duloxetine Other (See Comments)    GI upset  . Aspirin Nausea Only  . Codeine Itching  . Hydrocodone Nausea Only  . Metformin And Related Diarrhea and Nausea Only  . Oxycodone Nausea And Vomiting    GI Upset (intolerance)  . Victoza [Liraglutide] Other (See Comments)    Abdominal pain   Past Medical History:  Diagnosis Date  . Allergy   . Diabetes mellitus without complication ([place])    type 2  . GERD (gastroesophageal reflux disease)   . Headache   . History of hiatal hernia   . Hypercholesterolemia   . Numbness    left, outer thigh  . Palpitations    in the past, no current issues per patient  . Postlaminectomy syndrome    Past Surgical History:  Procedure Laterality Date  . ABDOMINAL HYSTERECTOMY  1982  . BACK SURGERY     x 2   . BREAST REDUCTION SURGERY Bilateral 03/17/2016   Procedure: MAMMARY REDUCTION  (BREAST);  Surgeon: Wallace Going, DO;  Location: [place];  Service: Plastics;  Laterality: Bilateral;  . COLONOSCOPY    . SHOULDER SURGERY  06/2008, 09/2010   Right, by Dr. Karie Soda, then Dr. Judeth Horn  .  SPINAL CORD STIMULATOR INSERTION N/A 12/02/2017   Procedure: LUMBAR SPINAL CORD STIMULATOR INSERTION;  Surgeon: Ashok Pall, MD;  Location: [place];  Service: Neurosurgery;  Laterality: N/A;  . SPINAL CORD STIMULATOR TRIAL N/A 11/25/2017   Procedure: INSERTION LUMBAR SPINAL CORD STIMULATOR TRIAL  ;  Surgeon: Ashok Pall, MD;  Location: [place];  Service: Neurosurgery;  Laterality: N/A;   INSERTION LUMBAR SPINAL CORD STIMULATOR TRIAL    . UPPER GI ENDOSCOPY     Family History  Problem Relation Age of Onset  . Heart disease Mother   . Diabetes Mother   . Hypertension Mother   . Stroke Mother   . Heart disease Father   . Heart disease Sister   . Diabetes Sister   . Hyperlipidemia Sister   . Hypertension Sister   . Stroke Sister   . Alcohol abuse Brother   . Hyperlipidemia Brother    Social History   Socioeconomic History  . Marital status: Divorced    Spouse name: Not on file  . Number of children: 1  . Years of education: 9th grade  . Highest education level: 9th grade  Occupational History  . Occupation: Disabled.     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 Smoking status: Former  Smoker    Types: Cigarettes    Quit date: 07/30/2007    Years since quitting: 12.8  . Smokeless tobacco: Never Used  Vaping Use  . Vaping Use: Never used  Substance and Sexual Activity  . Alcohol use: No  . Drug use: No  . Sexual activity: Not Currently    Birth control/protection: Surgical    Comment: Hysterectomy  Other Topics Concern  . Not on file  Social History Narrative   Lives alone.   Right-handed.   No daily caffeine use.   Social Determinants of Health   Financial Resource Strain: Not on file  Food Insecurity: Not on file  Transportation Needs: Not on file  Physical Activity: Not on file  Stress: Not on file  Social Connections: Not on file  Intimate Partner Violence: Not on file   Prior to Admission medications   Medication Sig Start Date End Date Taking?  Authorizing Provider  aspirin EC 81 MG tablet Take 1 tablet (81 mg total) by mouth daily. 12/08/17  Yes Costella, Vista Mink, PA-C  cholecalciferol (VITAMIN D) 1000 units tablet Take 1,000 Units by mouth daily.   Yes [provider]  DEXILANT 60 MG capsule Take 60 mg by mouth daily.  10/25/15  Yes [provider]  dicyclomine (BENTYL) 20 MG tablet Take 20 mg by mouth 3 (three) times daily before meals. 08/06/19  Yes [provider]  doxepin (SINEQUAN) 10 MG capsule Take 10 mg by mouth at bedtime. 10/13/17  Yes [provider]  fluticasone (FLONASE) 50 MCG/ACT nasal spray PLACE 1 SPRAY INTO BOTH NOSTRILS DAILY. Patient taking differently: Place 2 sprays into both nostrils 2 (two) times daily as needed for allergies. 10/31/19  Yes Hali Marry, MD  glipiZIDE (GLUCOTROL) 10 MG tablet TAKE 1 TABLET BY MOUTH TWICE A DAY Patient taking differently: Take 10 mg by mouth 2 (two) times daily before a meal. 05/19/20  Yes Metheney, Rene Kocher, MD  JARDIANCE 25 MG TABS tablet TAKE 25 MG BY MOUTH DAILY. Patient taking differently: Take 25 mg by mouth daily. 03/05/20  Yes Hali Marry, MD  Krill Oil 500 MG CAPS Take 500 mg by mouth daily.   Yes [provider]  LANTUS SOLOSTAR 100 UNIT/ML Solostar Pen INJECT 38 UNITS INTO THE SKIN AT BEDTIME. Patient taking differently: Inject 34 Units into the skin at bedtime. 02/18/20  Yes Hali Marry, MD  levocetirizine (XYZAL) 5 MG tablet TAKE 1 TABLET BY MOUTH EVERY DAY IN THE EVENING Patient taking differently: Take 5 mg by mouth every evening. 09/19/19  Yes Hali Marry, MD  Multiple Vitamins-Minerals (MULTIVITAMIN WOMEN 50+ PO) Take 1 capsule by mouth daily.   Yes [provider]  OVER THE COUNTER MEDICATION Take 1 tablet by mouth 3 (three) times daily. Golo otc supplement   Yes [provider]  perphenazine (TRILAFON) 8 MG tablet Take 8 mg by mouth at bedtime. 12/17/16  Yes  [provider]  pravastatin (PRAVACHOL) 40 MG tablet TAKE 1 TABLET BY MOUTH EVERYDAY AT BEDTIME Patient taking differently: Take 40 mg by mouth at bedtime. 08/06/19  Yes Hali Marry, MD  pregabalin (LYRICA) 200 MG capsule Take 1 capsule (200 mg total) by mouth 3 (three) times daily. 11/28/19  Yes Marcial Pacas, MD  tiZANidine (ZANAFLEX) 4 MG tablet Take 1 tablet (4 mg total) by mouth every 8 (eight) hours as needed for muscle spasms. 04/17/20  Yes Ashok Pall, MD  traMADol (ULTRAM) 50 MG tablet TAKE  1 TABLET (50 MG TOTAL) BY MOUTH EVERY 8 (EIGHT) HOURS AS NEEDED FOR MODERATE PAIN 05/05/20  Yes Silverio Decamp, MD  ACCU-CHEK AVIVA PLUS test strip FOR TESTING BLOOD SUGARS 3 TIMES DAILY. DX:E11.9 12/28/19   Hali Marry, MD  Accu-Chek Softclix Lancets lancets Dx:E11.9 12/10/19   Hali Marry, MD  B-D ULTRAFINE III SHORT PEN 31G X 8 MM MISC USE AS DIRECTED 06/28/19   Hali Marry, MD  blood glucose meter kit and supplies KIT Dispense glucometer, test strips, and lancets. Use to check blood sugar twice daily. Dx: E11.9 10/17/17   Hali Marry, MD  ciclopirox (PENLAC) 8 % solution Apply topically at bedtime. Apply over nail and surrounding skin. Apply daily over previous coat. After seven (7) days, may remove with alcohol and continue cycle. Patient not taking: No sig reported 11/16/19   Trula Slade, DPM  doxycycline (VIBRA-TABS) 100 MG tablet Take 1 tablet (100 mg total) by mouth 2 (two) times daily for 7 days. Patient not taking: No sig reported 05/16/20 05/23/20  Silverio Decamp, MD  famotidine (PEPCID) 20 MG tablet Take 60 mg by mouth at bedtime. 08/11/19   [provider]   Physical Exam Constitutional:      General: She is not in acute distress.    Appearance: Normal appearance. She is not toxic-appearing.  HENT:     Head: Normocephalic and atraumatic.     Right Ear: Tympanic membrane normal.     Left Ear: Tympanic membrane  normal.     Nose: Nose normal.     Mouth/Throat:     Mouth: Mucous membranes are moist.     Pharynx: Oropharynx is clear.  Eyes:     Extraocular Movements: Extraocular movements intact.     Pupils: Pupils are equal, round, and reactive to light.  Cardiovascular:     Rate and Rhythm: Normal rate and regular rhythm.     Pulses: Normal pulses.  Pulmonary:     Effort: Pulmonary effort is normal.  Abdominal:     General: Abdomen is flat.     Palpations: Abdomen is soft.  Musculoskeletal:     Cervical back: Normal range of motion and neck supple.  Skin:    Comments: Lumbar wound dehiscence Mild induration  Neurological:     General: No focal deficit present.     Mental Status: She is alert and oriented to person, place, and time. Mental status is at baseline.     Cranial Nerves: No cranial nerve deficit.     Sensory: No sensory deficit.     Motor: No weakness.     Coordination: Coordination normal.     Gait: Gait abnormal.     Deep Tendon Reflexes: Reflexes normal.  Psychiatric:        Mood and Affect: Mood normal.        Behavior: Behavior normal.        Thought Content: Thought content normal.        Judgment: Judgment normal.

## 2020-05-22 NOTE — Anesthesia Postprocedure Evaluation (Signed)
Anesthesia Post Note  Patient: Amanda Morris  Procedure(s) Performed: Incision and drainage of lumbar wound (N/A Spine Lumbar)     Patient location during evaluation: PACU Anesthesia Type: General Level of consciousness: awake and alert Pain management: pain level controlled Vital Signs Assessment: post-procedure vital signs reviewed and stable Respiratory status: spontaneous breathing, nonlabored ventilation and respiratory function stable Cardiovascular status: blood pressure returned to baseline and stable Postop Assessment: no apparent nausea or vomiting Anesthetic complications: no   No complications documented.  Last Vitals:  Vitals:   05/22/20 1458 05/22/20 1500  BP: 137/90 124/71  Pulse:  81  Resp:  (!) 29  Temp:    SpO2:  100%    Last Pain:  Vitals:   05/22/20 1528  TempSrc:   PainSc: Asleep                 Riddik Senna,W. EDMOND

## 2020-05-22 NOTE — Anesthesia Procedure Notes (Signed)

## 2020-05-22 NOTE — Progress Notes (Signed)
Pharmacy Antibiotic Note  Amanda Morris is a 70 y.o. female admitted on 05/22/2020 with lumbar wound.  Pharmacy has been consulted for vanc dosing.  Pt recently received a lumbar extension procedure. Wound had some drainage. She got I&D today. Vanc ordered empirically  Scr 0.73  Plan: Vanc 1g q12>>AUC 439, scr 0.73, clinccal Levels as needed  Height: 5' 2.5" (158.8 cm) Weight: 91.2 kg (201 lb) IBW/kg (Calculated) : 51.25  Temp (24hrs), Avg:97.5 F (36.4 C), Min:97.3 F (36.3 C), Max:97.6 F (36.4 C)  Recent Labs  Lab 05/16/20 1205  WBC 10.3  CREATININE 0.73    Estimated Creatinine Clearance: 72.5 mL/min (by C-G formula based on SCr of 0.73 mg/dL).    Allergies  Allergen Reactions  . Penicillins Hives and Other (See Comments)    PATIENT HAS HAD A PCN REACTION WITH IMMEDIATE RASH, FACIAL/TONGUE/THROAT SWELLING, SOB, OR LIGHTHEADEDNESS WITH HYPOTENSION:  #  #  YES  #  #  Has patient had a PCN reaction causing severe rash involving mucus membranes or skin necrosis: No Has patient had a PCN reaction that required hospitalization: No Has patient had a PCN reaction occurring within the last 10 years: No If all of the above answers are "NO", then may proceed with Cephalosporin use.   . Duloxetine Other (See Comments)    GI upset  . Aspirin Nausea Only  . Codeine Itching  . Hydrocodone Nausea Only  . Metformin And Related Diarrhea and Nausea Only  . Oxycodone Nausea And Vomiting    GI Upset (intolerance)  . Victoza [Liraglutide] Other (See Comments)    Abdominal pain    Antimicrobials this admission: 1/27 vanc>>  Dose adjustments this admission:   Microbiology results: 1/21 blood>neg 1/27 wound>>  Onnie Boer, PharmD, [place], AAHIVP, CPP Infectious Disease Pharmacist 05/22/2020 8:12 PM

## 2020-05-22 NOTE — Progress Notes (Signed)
Niece  Updated with pt's progress and need to overnight w/ anticipated d/c in morning.

## 2020-05-22 NOTE — Discharge Instructions (Signed)
You may remove the dressing on Monday. You may shower tomorrow, the dressing can be wet. Pat yourself dry after showering.  Call if you have continued drainage from the incision.

## 2020-05-22 NOTE — Progress Notes (Signed)
Remains nauseated/orders rec'd from Dr Therisa Doyne MDA

## 2020-05-22 NOTE — Transfer of Care (Signed)
Immediate Anesthesia Transfer of Care Note  Patient: Amanda Morris  Procedure(s) Performed: Incision and drainage of lumbar wound (N/A Spine Lumbar)  Patient Location: PACU  Anesthesia Type:General  Level of Consciousness: awake, alert  and oriented  Airway & Oxygen Therapy: Patient Spontanous Breathing and Patient connected to face mask oxygen  Post-op Assessment: Report given to RN, Post -op Vital signs reviewed and stable and Patient moving all extremities  Post vital signs: Reviewed and stable  Last Vitals:  Vitals Value Taken Time  BP 125/79 05/22/20 1432  Temp    Pulse 83 05/22/20 1434  Resp 28 05/22/20 1434  SpO2 100 % 05/22/20 1434  Vitals shown include unvalidated device data.  Last Pain:  Vitals:   05/22/20 1108  TempSrc:   PainSc: 0-No pain         Complications: No complications documented.

## 2020-05-22 NOTE — Progress Notes (Signed)
Spoke with Dr Christella Noa, made aware pt has persistent nausea and feels can't go home, plus staes she has no one to stay with her / plan overnite stay / text sent to neice

## 2020-05-22 NOTE — Progress Notes (Signed)
Appears wek, WOB difficult , tachypneic and effort is weak, LEFT nasal; airway plac3d and maintained on SMo2 at 8 lpm/ Dr Therisa Doyne called and otw to bedside

## 2020-05-22 NOTE — Anesthesia Preprocedure Evaluation (Addendum)
Anesthesia Evaluation  Patient identified by MRN, date of birth, ID band Patient awake    Reviewed: Allergy & Precautions, H&P , NPO status , Patient's Chart, lab work & pertinent test results  Airway Mallampati: II  TM Distance: >3 FB Neck ROM: Full    Dental no notable dental hx. (+) Edentulous Upper, Edentulous Lower, Dental Advisory Given   Pulmonary neg pulmonary ROS, former smoker,    Pulmonary exam normal breath sounds clear to auscultation       Cardiovascular hypertension,  Rhythm:Regular Rate:Normal     Neuro/Psych  Headaches, negative psych ROS   GI/Hepatic Neg liver ROS, hiatal hernia, GERD  Medicated,  Endo/Other  diabetes, Type 2, Oral Hypoglycemic AgentsMorbid obesity  Renal/GU negative Renal ROS  negative genitourinary   Musculoskeletal   Abdominal   Peds  Hematology negative hematology ROS (+)   Anesthesia Other Findings   Reproductive/Obstetrics negative OB ROS                            Anesthesia Physical Anesthesia Plan  ASA: III  Anesthesia Plan: General   Post-op Pain Management:    Induction: Intravenous  PONV Risk Score and Plan: 4 or greater and Ondansetron, Midazolam, Treatment may vary due to age or medical condition and Dexamethasone  Airway Management Planned: Oral ETT  Additional Equipment:   Intra-op Plan:   Post-operative Plan: Extubation in OR  Informed Consent: I have reviewed the patients History and Physical, chart, labs and discussed the procedure including the risks, benefits and alternatives for the proposed anesthesia with the patient or authorized representative who has indicated his/her understanding and acceptance.     Dental advisory given  Plan Discussed with: CRNA  Anesthesia Plan Comments:         Anesthesia Quick Evaluation

## 2020-05-23 ENCOUNTER — Ambulatory Visit: Payer: Medicare Other | Admitting: Sports Medicine

## 2020-05-23 ENCOUNTER — Encounter (HOSPITAL_COMMUNITY): Payer: Self-pay | Admitting: Neurosurgery

## 2020-05-23 DIAGNOSIS — Z7984 Long term (current) use of oral hypoglycemic drugs: Secondary | ICD-10-CM | POA: Diagnosis not present

## 2020-05-23 DIAGNOSIS — Z87891 Personal history of nicotine dependence: Secondary | ICD-10-CM | POA: Diagnosis not present

## 2020-05-23 DIAGNOSIS — E119 Type 2 diabetes mellitus without complications: Secondary | ICD-10-CM | POA: Diagnosis not present

## 2020-05-23 DIAGNOSIS — Z7982 Long term (current) use of aspirin: Secondary | ICD-10-CM | POA: Diagnosis not present

## 2020-05-23 DIAGNOSIS — T8149XA Infection following a procedure, other surgical site, initial encounter: Secondary | ICD-10-CM | POA: Diagnosis not present

## 2020-05-23 DIAGNOSIS — Z79899 Other long term (current) drug therapy: Secondary | ICD-10-CM | POA: Diagnosis not present

## 2020-05-23 DIAGNOSIS — Z20822 Contact with and (suspected) exposure to covid-19: Secondary | ICD-10-CM | POA: Diagnosis not present

## 2020-05-23 LAB — GLUCOSE, CAPILLARY
Glucose-Capillary: 141 mg/dL — ABNORMAL HIGH (ref 70–99)
Glucose-Capillary: 79 mg/dL (ref 70–99)
Glucose-Capillary: 93 mg/dL (ref 70–99)

## 2020-05-23 MED ORDER — INSULIN ASPART 100 UNIT/ML ~~LOC~~ SOLN: 0.0000 [IU] | Freq: Three times a day (TID) | SUBCUTANEOUS | Status: DC

## 2020-05-23 MED ORDER — VANCOMYCIN HCL 1250 MG/250ML IV SOLN
1250.0000 mg | INTRAVENOUS | Status: DC
  Filled 2020-05-23: qty 250

## 2020-05-23 NOTE — Plan of Care (Signed)
Patient discharged home with family. Script and discharged instructions given to patient with stated understanding . Patient has all belongings at the time of discharged.

## 2020-05-23 NOTE — Care Management Obs Status (Signed)
[facility] NOTIFICATION   Patient Details  Name: Amanda Morris MRN: [id number] Date of Birth: 12/28/1950   Medicare Observation Status Notification Given:  Yes    Angelita Ingles, RN 05/23/2020, 3:31 PM

## 2020-05-23 NOTE — Care Management CC44 (Signed)
Condition Code 44 Documentation Completed  Patient Details  Name: Amanda Morris MRN: [id number] Date of Birth: [birth year]   Condition Code 44 given:  Yes Patient signature on Condition Code 44 notice:  Yes Documentation of 2 MD's agreement:  Yes Code 44 added to claim:  Yes    Angelita Ingles, RN 05/23/2020, 3:32 PM

## 2020-05-23 NOTE — Progress Notes (Signed)
Pharmacy Antibiotic Note  Amanda Morris is a 70 y.o. female admitted on 05/22/2020 with a lumbar wound infection, now s/p I&D. Pharmacy has been consulted for Vancomycin dosing.  Will adjust the dose today to target goal AUC. Noted possible plans for d/c today.  Plan: - Adjust Vancomycin to 1250 mg IV every 24 hours (est AUC 534, SCr 0.8, Vd 0.5) - Will continue to follow renal function, culture results, LOT, and antibiotic de-escalation plans   Height: 5' 2.5" (158.8 cm) Weight: 91.2 kg (201 lb) IBW/kg (Calculated) : 51.25  Temp (24hrs), Avg:97.9 F (36.6 C), Min:97.3 F (36.3 C), Max:98.4 F (36.9 C)  Recent Labs  Lab 05/16/20 1205  WBC 10.3  CREATININE 0.73    Estimated Creatinine Clearance: 72.5 mL/min (by C-G formula based on SCr of 0.73 mg/dL).    Allergies  Allergen Reactions  . Penicillins Hives and Other (See Comments)    PATIENT HAS HAD A PCN REACTION WITH IMMEDIATE RASH, FACIAL/TONGUE/THROAT SWELLING, SOB, OR LIGHTHEADEDNESS WITH HYPOTENSION:  #  #  YES  #  #  Has patient had a PCN reaction causing severe rash involving mucus membranes or skin necrosis: No Has patient had a PCN reaction that required hospitalization: No Has patient had a PCN reaction occurring within the last 10 years: No If all of the above answers are "NO", then may proceed with Cephalosporin use.   . Duloxetine Other (See Comments)    GI upset  . Aspirin Nausea Only  . Codeine Itching  . Hydrocodone Nausea Only  . Metformin And Related Diarrhea and Nausea Only  . Oxycodone Nausea And Vomiting    GI Upset (intolerance)  . Victoza [Liraglutide] Other (See Comments)    Abdominal pain    Antimicrobials this admission: 1/27 vanc>>  Dose adjustments this admission:   Microbiology results: 1/21 blood>neg 1/27 wound>> few GPC on GS >>  Thank you for allowing pharmacy to be a part of this patient's care.  Alycia Rossetti, PharmD, BCPS Clinical Pharmacist Clinical phone for  05/23/2020: [phone] 05/23/2020 10:29 AM   **Pharmacist phone directory can now be found on [email or web address] ([email or web address]).  Listed under [place].

## 2020-05-23 NOTE — Care Management Obs Status (Signed)
[facility] NOTIFICATION   Patient Details  Name: Amanda Morris MRN: [id number] Date of Birth: 03/26/1951   Medicare Observation Status Notification Given:  Yes    Angelita Ingles, RN 05/23/2020, 2:33 PM

## 2020-05-23 NOTE — Discharge Summary (Signed)
Physician Discharge Summary  Patient ID: Amanda Morris MRN: [id number] DOB/AGE: 68-Aug-1952 69 y.o.  Admit date: 05/22/2020 Discharge date: 05/23/2020  Admission Diagnoses:wound infection  Discharge Diagnoses:  Active Problems:   Postoperative wound infection   Wound infection   Discharged Condition: good  Hospital Course: Mrs. Montejano was taken to the operating room for what was presumed to be a wound infection. As it turned out it was superficial only. Cultures were sent. I insructed her to take her doxycycline. She will return for suture removal in two weeks.   Treatments: surgery: wound debridement  Discharge Exam: Blood pressure 110/68, pulse 67, temperature 98.2 F (36.8 C), temperature source Oral, resp. rate 16, height 5' 2.5" (1.588 m), weight 91.2 kg, SpO2 98 %. General appearance: alert, cooperative, appears older than stated age and no distress  Disposition: Discharge disposition: 01-Home or Self Care      Lumbar wound infection Discharge Instructions    Call MD for:  persistant nausea and vomiting   Complete by: As directed    Call MD for:  redness, tenderness, or signs of infection (pain, swelling, redness, odor or green/yellow discharge around incision site)   Complete by: As directed    Call MD for:  severe uncontrolled pain   Complete by: As directed    Call MD for:  temperature >100.4   Complete by: As directed    Diet - low sodium heart healthy   Complete by: As directed    Diet general   Complete by: As directed    Increase activity slowly   Complete by: As directed      Allergies as of 05/23/2020      Reactions   Penicillins Hives, Other (See Comments)   PATIENT HAS HAD A PCN REACTION WITH IMMEDIATE RASH, FACIAL/TONGUE/THROAT SWELLING, SOB, OR LIGHTHEADEDNESS WITH HYPOTENSION:  #  #  YES  #  #  Has patient had a PCN reaction causing severe rash involving mucus membranes or skin necrosis: No Has patient had a PCN reaction that required  hospitalization: No Has patient had a PCN reaction occurring within the last 10 years: No If all of the above answers are "NO", then may proceed with Cephalosporin use.   Duloxetine Other (See Comments)   GI upset   Aspirin Nausea Only   Codeine Itching   Hydrocodone Nausea Only   Metformin And Related Diarrhea, Nausea Only   Oxycodone Nausea And Vomiting   GI Upset (intolerance)   Victoza [liraglutide] Other (See Comments)   Abdominal pain      Medication List    STOP taking these medications   ciclopirox 8 % solution Commonly known as: Penlac     TAKE these medications   Accu-Chek Aviva Plus test strip Generic drug: glucose blood FOR TESTING BLOOD SUGARS 3 TIMES DAILY. DX:E11.9   Accu-Chek Softclix Lancets lancets Dx:E11.9   aspirin EC 81 MG tablet Take 1 tablet (81 mg total) by mouth daily.   B-D ULTRAFINE III SHORT PEN 31G X 8 MM Misc Generic drug: Insulin Pen Needle USE AS DIRECTED   blood glucose meter kit and supplies Kit Dispense glucometer, test strips, and lancets. Use to check blood sugar twice daily. Dx: E11.9   cholecalciferol 1000 units tablet Commonly known as: VITAMIN D Take 1,000 Units by mouth daily.   Dexilant 60 MG capsule Generic drug: dexlansoprazole Take 60 mg by mouth daily.   dicyclomine 20 MG tablet Commonly known as: BENTYL Take 20 mg by mouth 3 (three) times  daily before meals.   doxepin 10 MG capsule Commonly known as: SINEQUAN Take 10 mg by mouth at bedtime.   doxycycline 100 MG tablet Commonly known as: VIBRA-TABS Take 1 tablet (100 mg total) by mouth 2 (two) times daily for 10 days.   famotidine 20 MG tablet Commonly known as: PEPCID Take 60 mg by mouth at bedtime.   fluticasone 50 MCG/ACT nasal spray Commonly known as: FLONASE PLACE 1 SPRAY INTO BOTH NOSTRILS DAILY. What changed:   how much to take  when to take this  reasons to take this   glipiZIDE 10 MG tablet Commonly known as: GLUCOTROL TAKE 1 TABLET BY  MOUTH TWICE A DAY What changed: when to take this   Jardiance 25 MG Tabs tablet Generic drug: empagliflozin TAKE 25 MG BY MOUTH DAILY.   Krill Oil 500 MG Caps Take 500 mg by mouth daily.   Lantus SoloStar 100 UNIT/ML Solostar Pen Generic drug: insulin glargine INJECT 38 UNITS INTO THE SKIN AT BEDTIME. What changed: See the new instructions.   levocetirizine 5 MG tablet Commonly known as: XYZAL TAKE 1 TABLET BY MOUTH EVERY DAY IN THE EVENING What changed:   how much to take  how to take this   MULTIVITAMIN WOMEN 50+ PO Take 1 capsule by mouth daily.   OVER THE COUNTER MEDICATION Take 1 tablet by mouth 3 (three) times daily. Golo otc supplement   perphenazine 8 MG tablet Commonly known as: TRILAFON Take 8 mg by mouth at bedtime.   pravastatin 40 MG tablet Commonly known as: PRAVACHOL TAKE 1 TABLET BY MOUTH EVERYDAY AT BEDTIME What changed:   how much to take  how to take this  when to take this  additional instructions   pregabalin 200 MG capsule Commonly known as: Lyrica Take 1 capsule (200 mg total) by mouth 3 (three) times daily.   tiZANidine 4 MG tablet Commonly known as: ZANAFLEX Take 1 tablet (4 mg total) by mouth every 8 (eight) hours as needed for muscle spasms.   traMADol 50 MG tablet Commonly known as: ULTRAM TAKE 1 TABLET (50 MG TOTAL) BY MOUTH EVERY 8 (EIGHT) HOURS AS NEEDED FOR MODERATE PAIN       Follow-up Information    Ashok Pall, MD In 10 days.   Specialty: Neurosurgery Why: please call to make an appointment Contact information: [address] [place] [address] [phone]               Signed: Ashok Pall 05/23/2020, 2:41 PM

## 2020-05-27 ENCOUNTER — Encounter: Payer: Medicare Other | Admitting: Physical Therapy

## 2020-05-27 ENCOUNTER — Telehealth: Payer: Self-pay | Admitting: *Deleted

## 2020-05-27 LAB — AEROBIC/ANAEROBIC CULTURE W GRAM STAIN (SURGICAL/DEEP WOUND)

## 2020-05-27 NOTE — Telephone Encounter (Signed)
Patient is needing to reschedule her upcoming appointment,recently coming out of hospital. Please call.

## 2020-05-28 ENCOUNTER — Ambulatory Visit: Payer: Medicare Other | Admitting: Family Medicine

## 2020-05-28 NOTE — Telephone Encounter (Signed)
Called patient 2 times, unable to lvm

## 2020-05-28 NOTE — Progress Notes (Deleted)
Established Patient Office Visit  Subjective:  Patient ID: Amanda Morris, female    DOB: Dec 31, 1950  Age: 70 y.o. MRN: [id number]  CC: Morris chief complaint on file.   HPI Amanda Morris presents for   Diabetes - Morris hypoglycemic events. Morris wounds or sores that are not healing well. Morris increased thirst or urination. Checking glucose at home. Taking medications as prescribed without any side effects.   Past Medical History:  Diagnosis Date  . Allergy   . Diabetes mellitus without complication ([place])    type 2  . GERD (gastroesophageal reflux disease)   . Headache   . History of hiatal hernia   . Hypercholesterolemia   . Numbness    left, outer thigh  . Palpitations    in the past, Morris current issues per patient  . Postlaminectomy syndrome     Past Surgical History:  Procedure Laterality Date  . ABDOMINAL HYSTERECTOMY  1982  . BACK SURGERY     x 2   . BREAST REDUCTION SURGERY Bilateral 03/17/2016   Procedure: MAMMARY REDUCTION  (BREAST);  Surgeon: Wallace Going, DO;  Location: [place];  Service: Plastics;  Laterality: Bilateral;  . COLONOSCOPY    . LUMBAR WOUND DEBRIDEMENT N/A 05/22/2020   Procedure: Incision and drainage of lumbar wound;  Surgeon: Ashok Pall, MD;  Location: [place];  Service: Neurosurgery;  Laterality: N/A;  . SHOULDER SURGERY  06/2008, 09/2010   Right, by Dr. Karie Soda, then Dr. Judeth Horn  . SPINAL CORD STIMULATOR INSERTION N/A 12/02/2017   Procedure: LUMBAR SPINAL CORD STIMULATOR INSERTION;  Surgeon: Ashok Pall, MD;  Location: [place];  Service: Neurosurgery;  Laterality: N/A;  . SPINAL CORD STIMULATOR TRIAL N/A 11/25/2017   Procedure: INSERTION LUMBAR SPINAL CORD STIMULATOR TRIAL  ;  Surgeon: Ashok Pall, MD;  Location: [place];  Service: Neurosurgery;  Laterality: N/A;   INSERTION LUMBAR SPINAL CORD STIMULATOR TRIAL    . UPPER GI ENDOSCOPY      Family History  Problem Relation Age of Onset  . Heart disease Mother    . Diabetes Mother   . Hypertension Mother   . Stroke Mother   . Heart disease Father   . Heart disease Sister   . Diabetes Sister   . Hyperlipidemia Sister   . Hypertension Sister   . Stroke Sister   . Alcohol abuse Brother   . Hyperlipidemia Brother     Social History   Socioeconomic History  . Marital status: Divorced    Spouse name: Not on file  . Number of children: 1  . Years of education: 9th grade  . Highest education level: 9th grade  Occupational History  . Occupation: Disabled.     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 Smoking status: Former Smoker    Types: Cigarettes    Quit date: 07/30/2007    Years since quitting: 12.8  . Smokeless tobacco: Never Used  Vaping Use  . Vaping Use: Never used  Substance and Sexual Activity  . Alcohol use: Morris  . Drug use: Morris  . Sexual activity: Not Currently    Birth control/protection: Surgical    Comment: Hysterectomy  Other Topics Concern  . Not on file  Social History Narrative   Lives alone.   Right-handed.   Morris daily caffeine use.   Social Determinants of Health   Financial Resource Strain: Not on file  Food Insecurity: Not on file  Transportation Needs: Not on file  Physical Activity: Not on file  Stress: Not on file  Social Connections: Not on file  Intimate Partner Violence: Not on file    Outpatient Medications Prior to Visit  Medication Sig Dispense Refill  . ACCU-CHEK AVIVA PLUS test strip FOR TESTING BLOOD SUGARS 3 TIMES DAILY. DX:E11.9 300 strip 12  . Accu-Chek Softclix Lancets lancets Dx:E11.9 100 each PRN  . aspirin EC 81 MG tablet Take 1 tablet (81 mg total) by mouth daily.    . B-D ULTRAFINE III SHORT PEN 31G X 8 MM MISC USE AS DIRECTED 100 each 6  . blood glucose meter kit and supplies KIT Dispense glucometer, test strips, and lancets. Use to check blood sugar twice daily. Dx: E11.9 1 each 0  . cholecalciferol (VITAMIN D) 1000 units tablet Take 1,000 Units by mouth daily.    Marland Kitchen  DEXILANT 60 MG capsule Take 60 mg by mouth daily.   3  . dicyclomine (BENTYL) 20 MG tablet Take 20 mg by mouth 3 (three) times daily before meals.    Marland Kitchen doxepin (SINEQUAN) 10 MG capsule Take 10 mg by mouth at bedtime.    Marland Kitchen doxycycline (VIBRA-TABS) 100 MG tablet Take 1 tablet (100 mg total) by mouth 2 (two) times daily for 10 days. 20 tablet 0  . famotidine (PEPCID) 20 MG tablet Take 60 mg by mouth at bedtime.    . fluticasone (FLONASE) 50 MCG/ACT nasal spray PLACE 1 SPRAY INTO BOTH NOSTRILS DAILY. (Patient taking differently: Place 2 sprays into both nostrils 2 (two) times daily as needed for allergies.) 48 mL 4  . glipiZIDE (GLUCOTROL) 10 MG tablet TAKE 1 TABLET BY MOUTH TWICE A DAY (Patient taking differently: Take 10 mg by mouth 2 (two) times daily before a meal.) 180 tablet 1  . JARDIANCE 25 MG TABS tablet TAKE 25 MG BY MOUTH DAILY. (Patient taking differently: Take 25 mg by mouth daily.) 90 tablet 1  . Krill Oil 500 MG CAPS Take 500 mg by mouth daily.    Marland Kitchen LANTUS SOLOSTAR 100 UNIT/ML Solostar Pen INJECT 38 UNITS INTO THE SKIN AT BEDTIME. (Patient taking differently: Inject 34 Units into the skin at bedtime.) 15 mL 5  . levocetirizine (XYZAL) 5 MG tablet TAKE 1 TABLET BY MOUTH EVERY DAY IN THE EVENING (Patient taking differently: Take 5 mg by mouth every evening.) 90 tablet 3  . Multiple Vitamins-Minerals (MULTIVITAMIN WOMEN 50+ PO) Take 1 capsule by mouth daily.    Marland Kitchen OVER THE COUNTER MEDICATION Take 1 tablet by mouth 3 (three) times daily. Golo otc supplement    . perphenazine (TRILAFON) 8 MG tablet Take 8 mg by mouth at bedtime.  2  . pravastatin (PRAVACHOL) 40 MG tablet TAKE 1 TABLET BY MOUTH EVERYDAY AT BEDTIME (Patient taking differently: Take 40 mg by mouth at bedtime.) 90 tablet 3  . pregabalin (LYRICA) 200 MG capsule Take 1 capsule (200 mg total) by mouth 3 (three) times daily. 90 capsule 5  . tiZANidine (ZANAFLEX) 4 MG tablet Take 1 tablet (4 mg total) by mouth every 8 (eight) hours as  needed for muscle spasms. 60 tablet 0  . traMADol (ULTRAM) 50 MG tablet TAKE 1 TABLET (50 MG TOTAL) BY MOUTH EVERY 8 (EIGHT) HOURS AS NEEDED FOR MODERATE PAIN 90 tablet 0   Morris facility-administered medications prior to visit.    Allergies  Allergen Reactions  . Penicillins Hives and Other (See Comments)    PATIENT HAS HAD A PCN REACTION WITH IMMEDIATE RASH, FACIAL/TONGUE/THROAT SWELLING, SOB, OR  LIGHTHEADEDNESS WITH HYPOTENSION:  #  #  YES  #  #  Has patient had a PCN reaction causing severe rash involving mucus membranes or skin necrosis: Morris Has patient had a PCN reaction that required hospitalization: Morris Has patient had a PCN reaction occurring within the last 10 years: Morris If all of the above answers are "Morris", then may proceed with Cephalosporin use.   . Duloxetine Other (See Comments)    GI upset  . Aspirin Nausea Only  . Codeine Itching  . Hydrocodone Nausea Only  . Metformin And Related Diarrhea and Nausea Only  . Oxycodone Nausea And Vomiting    GI Upset (intolerance)  . Victoza [Liraglutide] Other (See Comments)    Abdominal pain    ROS Review of Systems    Objective:    Physical Exam  LMP  (LMP Unknown)  Wt Readings from Last 3 Encounters:  05/22/20 201 lb (91.2 kg)  04/16/20 220 lb (99.8 kg)  04/14/20 221 lb 11.2 oz (100.6 kg)     Health Maintenance Due  Topic Date Due  . OPHTHALMOLOGY EXAM  11/02/2019    There are Morris preventive care reminders to display for this patient.  Lab Results  Component Value Date   TSH 1.650 11/28/2019   Lab Results  Component Value Date   WBC 10.3 05/16/2020   HGB 12.8 05/16/2020   HCT 42.2 05/16/2020   MCV 74.7 (L) 05/16/2020   PLT 287 05/16/2020   Lab Results  Component Value Date   NA 142 05/16/2020   K 4.2 05/16/2020   CO2 25 05/16/2020   GLUCOSE 113 (H) 05/16/2020   BUN 8 05/16/2020   CREATININE 0.73 05/16/2020   BILITOT 0.4 05/16/2020   ALKPHOS 102 11/28/2019   AST 20 05/16/2020   ALT 14 05/16/2020    PROT 7.0 05/16/2020   ALBUMIN 3.9 11/28/2019   CALCIUM 9.1 05/16/2020   ANIONGAP 11 04/14/2020   Lab Results  Component Value Date   CHOL 151 02/05/2019   Lab Results  Component Value Date   HDL 37 (L) 02/05/2019   Lab Results  Component Value Date   LDLCALC 90 02/05/2019   Lab Results  Component Value Date   TRIG 139 02/05/2019   Lab Results  Component Value Date   CHOLHDL 4.1 02/05/2019   Lab Results  Component Value Date   HGBA1C 6.9 (H) 04/16/2020      Assessment & Plan:   Problem List Items Addressed This Visit      Cardiovascular and Mediastinum   Essential hypertension - Primary     Endocrine   Type 2 diabetes mellitus with other specified complication ([place])      Morris orders of the defined types were placed in this encounter.   Follow-up: Morris follow-ups on file.    Beatrice Lecher, MD

## 2020-05-29 ENCOUNTER — Other Ambulatory Visit: Payer: Self-pay | Admitting: Neurology

## 2020-05-29 ENCOUNTER — Encounter: Payer: Medicare Other | Admitting: Physical Therapy

## 2020-05-30 ENCOUNTER — Ambulatory Visit: Payer: Medicare Other | Admitting: Podiatry

## 2020-06-02 ENCOUNTER — Other Ambulatory Visit: Payer: Self-pay

## 2020-06-02 ENCOUNTER — Ambulatory Visit (INDEPENDENT_AMBULATORY_CARE_PROVIDER_SITE_OTHER): Payer: Medicare Other | Admitting: Family Medicine

## 2020-06-02 ENCOUNTER — Encounter: Payer: Self-pay | Admitting: Family Medicine

## 2020-06-02 VITALS — BP 116/66 | HR 102 | Ht 62.0 in | Wt 210.0 lb

## 2020-06-02 DIAGNOSIS — E7800 Pure hypercholesterolemia, unspecified: Secondary | ICD-10-CM | POA: Diagnosis not present

## 2020-06-02 DIAGNOSIS — E1169 Type 2 diabetes mellitus with other specified complication: Secondary | ICD-10-CM | POA: Diagnosis not present

## 2020-06-02 DIAGNOSIS — T8149XA Infection following a procedure, other surgical site, initial encounter: Secondary | ICD-10-CM

## 2020-06-02 DIAGNOSIS — I6529 Occlusion and stenosis of unspecified carotid artery: Secondary | ICD-10-CM

## 2020-06-02 DIAGNOSIS — E785 Hyperlipidemia, unspecified: Secondary | ICD-10-CM | POA: Diagnosis not present

## 2020-06-02 DIAGNOSIS — Z13220 Encounter for screening for lipoid disorders: Secondary | ICD-10-CM

## 2020-06-02 DIAGNOSIS — I10 Essential (primary) hypertension: Secondary | ICD-10-CM

## 2020-06-02 LAB — POCT GLYCOSYLATED HEMOGLOBIN (HGB A1C): Hemoglobin A1C: 6 % — AB (ref 4.0–5.6)

## 2020-06-02 MED ORDER — LANTUS SOLOSTAR 100 UNIT/ML ~~LOC~~ SOPN: 28.0000 [IU] | PEN_INJECTOR | Freq: Every day | SUBCUTANEOUS | 0 refills | Status: DC

## 2020-06-02 NOTE — Assessment & Plan Note (Signed)
Currently on antibiotic treatment she is doing well and says she really has not had a lot of pain which is great.

## 2020-06-02 NOTE — Assessment & Plan Note (Signed)
You for screening lipid panel.

## 2020-06-02 NOTE — Assessment & Plan Note (Signed)
Well-controlled.  Continue current regimen. 

## 2020-06-02 NOTE — Patient Instructions (Signed)
Decrease Lantus to 28 units.  If still having lows, please drop off you blood sugar log.

## 2020-06-02 NOTE — Progress Notes (Signed)
Established Patient Office Visit  Subjective:  Patient ID: Amanda Morris, female    DOB: Jun 24, 1950  Age: 70 y.o. MRN: [id number]  CC:  Chief Complaint  Patient presents with  . Diabetes    HPI Skylor Hughson presents for   Diabetes - no hypoglycemic events. No wounds or sores that are not healing well. No increased thirst or urination. Checking glucose at home. Taking medications as prescribed without any side effects.  Brought in blood glucose log from home.  She had quite a few low blood sugars documented mostly in the evenings around 5 or 6 PM sometimes mid afternoon.  Hypertension- Pt denies chest pain, SOB, dizziness, or heart palpitations.  Taking meds as directed w/o problems.  Denies medication side effects.    She also recently had surgery.  From her previous incision where she had back surgery she started leaking fluid that was mostly bloody and some pus.  And went back to see the surgeon they actually went in and did an I&D and cultures and she is now on antibiotics.   Past Medical History:  Diagnosis Date  . Allergy   . Diabetes mellitus without complication ([place])    type 2  . GERD (gastroesophageal reflux disease)   . Headache   . History of hiatal hernia   . Hypercholesterolemia   . Numbness    left, outer thigh  . Palpitations    in the past, no current issues per patient  . Postlaminectomy syndrome     Past Surgical History:  Procedure Laterality Date  . ABDOMINAL HYSTERECTOMY  1982  . BACK SURGERY     x 2   . BREAST REDUCTION SURGERY Bilateral 03/17/2016   Procedure: MAMMARY REDUCTION  (BREAST);  Surgeon: Wallace Going, DO;  Location: [place];  Service: Plastics;  Laterality: Bilateral;  . COLONOSCOPY    . LUMBAR WOUND DEBRIDEMENT N/A 05/22/2020   Procedure: Incision and drainage of lumbar wound;  Surgeon: Ashok Pall, MD;  Location: [place];  Service: Neurosurgery;  Laterality: N/A;  . SHOULDER SURGERY  06/2008,  09/2010   Right, by Dr. Karie Soda, then Dr. Judeth Horn  . SPINAL CORD STIMULATOR INSERTION N/A 12/02/2017   Procedure: LUMBAR SPINAL CORD STIMULATOR INSERTION;  Surgeon: Ashok Pall, MD;  Location: [place];  Service: Neurosurgery;  Laterality: N/A;  . SPINAL CORD STIMULATOR TRIAL N/A 11/25/2017   Procedure: INSERTION LUMBAR SPINAL CORD STIMULATOR TRIAL  ;  Surgeon: Ashok Pall, MD;  Location: [place];  Service: Neurosurgery;  Laterality: N/A;   INSERTION LUMBAR SPINAL CORD STIMULATOR TRIAL    . UPPER GI ENDOSCOPY      Family History  Problem Relation Age of Onset  . Heart disease Mother   . Diabetes Mother   . Hypertension Mother   . Stroke Mother   . Heart disease Father   . Heart disease Sister   . Diabetes Sister   . Hyperlipidemia Sister   . Hypertension Sister   . Stroke Sister   . Alcohol abuse Brother   . Hyperlipidemia Brother     Social History   Socioeconomic History  . Marital status: Divorced    Spouse name: Not on file  . Number of children: 1  . Years of education: 9th grade  . Highest education level: 9th grade  Occupational History  . Occupation: Disabled.     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 Smoking status: Former Smoker    Types: Cigarettes  Quit date: 07/30/2007    Years since quitting: 12.8  . Smokeless tobacco: Never Used  Vaping Use  . Vaping Use: Never used  Substance and Sexual Activity  . Alcohol use: No  . Drug use: No  . Sexual activity: Not Currently    Birth control/protection: Surgical    Comment: Hysterectomy  Other Topics Concern  . Not on file  Social History Narrative   Lives alone.   Right-handed.   No daily caffeine use.   Social Determinants of Health   Financial Resource Strain: Not on file  Food Insecurity: Not on file  Transportation Needs: Not on file  Physical Activity: Not on file  Stress: Not on file  Social Connections: Not on file  Intimate Partner Violence: Not on file    Outpatient  Medications Prior to Visit  Medication Sig Dispense Refill  . ACCU-CHEK AVIVA PLUS test strip FOR TESTING BLOOD SUGARS 3 TIMES DAILY. DX:E11.9 300 strip 12  . Accu-Chek Softclix Lancets lancets Dx:E11.9 100 each PRN  . aspirin EC 81 MG tablet Take 1 tablet (81 mg total) by mouth daily.    . B-D ULTRAFINE III SHORT PEN 31G X 8 MM MISC USE AS DIRECTED 100 each 6  . blood glucose meter kit and supplies KIT Dispense glucometer, test strips, and lancets. Use to check blood sugar twice daily. Dx: E11.9 1 each 0  . cholecalciferol (VITAMIN D) 1000 units tablet Take 1,000 Units by mouth daily.    Marland Kitchen DEXILANT 60 MG capsule Take 60 mg by mouth daily.   3  . dicyclomine (BENTYL) 20 MG tablet Take 20 mg by mouth 3 (three) times daily before meals.    Marland Kitchen doxepin (SINEQUAN) 10 MG capsule Take 10 mg by mouth at bedtime.    . famotidine (PEPCID) 20 MG tablet Take 60 mg by mouth at bedtime.    . fluticasone (FLONASE) 50 MCG/ACT nasal spray PLACE 1 SPRAY INTO BOTH NOSTRILS DAILY. (Patient taking differently: Place 2 sprays into both nostrils 2 (two) times daily as needed for allergies.) 48 mL 4  . glipiZIDE (GLUCOTROL) 10 MG tablet TAKE 1 TABLET BY MOUTH TWICE A DAY (Patient taking differently: Take 10 mg by mouth 2 (two) times daily before a meal.) 180 tablet 1  . JARDIANCE 25 MG TABS tablet TAKE 25 MG BY MOUTH DAILY. (Patient taking differently: Take 25 mg by mouth daily.) 90 tablet 1  . Krill Oil 500 MG CAPS Take 500 mg by mouth daily.    Marland Kitchen levocetirizine (XYZAL) 5 MG tablet TAKE 1 TABLET BY MOUTH EVERY DAY IN THE EVENING (Patient taking differently: Take 5 mg by mouth every evening.) 90 tablet 3  . Multiple Vitamins-Minerals (MULTIVITAMIN WOMEN 50+ PO) Take 1 capsule by mouth daily.    Marland Kitchen OVER THE COUNTER MEDICATION Take 1 tablet by mouth 3 (three) times daily. Golo otc supplement    . perphenazine (TRILAFON) 8 MG tablet Take 8 mg by mouth at bedtime.  2  . pravastatin (PRAVACHOL) 40 MG tablet TAKE 1 TABLET BY  MOUTH EVERYDAY AT BEDTIME (Patient taking differently: Take 40 mg by mouth at bedtime.) 90 tablet 3  . pregabalin (LYRICA) 200 MG capsule TAKE 1 CAPSULE (200 MG TOTAL) BY MOUTH 3 (THREE) TIMES DAILY. 90 capsule 5  . tiZANidine (ZANAFLEX) 4 MG tablet Take 1 tablet (4 mg total) by mouth every 8 (eight) hours as needed for muscle spasms. 60 tablet 0  . traMADol (ULTRAM) 50 MG tablet TAKE 1 TABLET (50  MG TOTAL) BY MOUTH EVERY 8 (EIGHT) HOURS AS NEEDED FOR MODERATE PAIN 90 tablet 0  . LANTUS SOLOSTAR 100 UNIT/ML Solostar Pen INJECT 38 UNITS INTO THE SKIN AT BEDTIME. (Patient taking differently: Inject 34 Units into the skin at bedtime.) 15 mL 5   No facility-administered medications prior to visit.    Allergies  Allergen Reactions  . Penicillins Hives and Other (See Comments)    PATIENT HAS HAD A PCN REACTION WITH IMMEDIATE RASH, FACIAL/TONGUE/THROAT SWELLING, SOB, OR LIGHTHEADEDNESS WITH HYPOTENSION:  #  #  YES  #  #  Has patient had a PCN reaction causing severe rash involving mucus membranes or skin necrosis: No Has patient had a PCN reaction that required hospitalization: No Has patient had a PCN reaction occurring within the last 10 years: No If all of the above answers are "NO", then may proceed with Cephalosporin use.   . Duloxetine Other (See Comments)    GI upset  . Aspirin Nausea Only  . Codeine Itching  . Hydrocodone Nausea Only  . Metformin And Related Diarrhea and Nausea Only  . Oxycodone Nausea And Vomiting    GI Upset (intolerance)  . Victoza [Liraglutide] Other (See Comments)    Abdominal pain    ROS Review of Systems    Objective:    Physical Exam Constitutional:      Appearance: She is well-developed and well-nourished.  HENT:     Head: Normocephalic and atraumatic.  Cardiovascular:     Rate and Rhythm: Normal rate and regular rhythm.     Heart sounds: Normal heart sounds.  Pulmonary:     Effort: Pulmonary effort is normal.     Breath sounds: Normal breath  sounds.  Skin:    General: Skin is warm and dry.  Neurological:     Mental Status: She is alert and oriented to person, place, and time.  Psychiatric:        Mood and Affect: Mood and affect normal.        Behavior: Behavior normal.     BP 116/66   Pulse (!) 102   Ht 5' 2"  (1.575 m)   Wt 210 lb (95.3 kg)   LMP  (LMP Unknown)   SpO2 99%   BMI 38.41 kg/m  Wt Readings from Last 3 Encounters:  06/02/20 210 lb (95.3 kg)  05/22/20 201 lb (91.2 kg)  04/16/20 220 lb (99.8 kg)     Health Maintenance Due  Topic Date Due  . OPHTHALMOLOGY EXAM  11/02/2019    There are no preventive care reminders to display for this patient.  Lab Results  Component Value Date   TSH 1.650 11/28/2019   Lab Results  Component Value Date   WBC 10.3 05/16/2020   HGB 12.8 05/16/2020   HCT 42.2 05/16/2020   MCV 74.7 (L) 05/16/2020   PLT 287 05/16/2020   Lab Results  Component Value Date   NA 142 05/16/2020   K 4.2 05/16/2020   CO2 25 05/16/2020   GLUCOSE 113 (H) 05/16/2020   BUN 8 05/16/2020   CREATININE 0.73 05/16/2020   BILITOT 0.4 05/16/2020   ALKPHOS 102 11/28/2019   AST 20 05/16/2020   ALT 14 05/16/2020   PROT 7.0 05/16/2020   ALBUMIN 3.9 11/28/2019   CALCIUM 9.1 05/16/2020   ANIONGAP 11 04/14/2020   Lab Results  Component Value Date   CHOL 151 02/05/2019   Lab Results  Component Value Date   HDL 37 (L) 02/05/2019   Lab Results  Component Value Date   LDLCALC 90 02/05/2019   Lab Results  Component Value Date   TRIG 139 02/05/2019   Lab Results  Component Value Date   CHOLHDL 4.1 02/05/2019   Lab Results  Component Value Date   HGBA1C 6.0 (A) 06/02/2020      Assessment & Plan:   Problem List Items Addressed This Visit      Cardiovascular and Mediastinum   Essential hypertension    Well-controlled.  Continue current regimen.      Carotid artery stenosis    You for screening lipid panel.        Endocrine   Type 2 diabetes mellitus with other  specified complication ([place]) - Primary    Her A1c looks phenomenal today at 6.0.  In fact is a little bit low and I am quite concerned about the frequency of hypoglycemic events.  We will decrease her insulin by 6 units.  She will go down to 28 units daily and keep an eye on her blood sugars over the next couple weeks I asked her to drop off a log or let us know if her blood sugars are still running low as we may need to decrease her room medication even more.  She is due for screening lipid panel.      Relevant Medications   insulin glargine (LANTUS SOLOSTAR) 100 UNIT/ML Solostar Pen   Other Relevant Orders   POCT glycosylated hemoglobin (Hb A1C) (Completed)   Hyperlipidemia associated with type 2 diabetes mellitus ([place])    Due to recheck lipids she is currently on pravastatin and tolerating it well.      Relevant Medications   insulin glargine (LANTUS SOLOSTAR) 100 UNIT/ML Solostar Pen     Other   Postoperative wound infection    Currently on antibiotic treatment she is doing well and says she really has not had a lot of pain which is great.       Other Visit Diagnoses    Screening, lipid       Relevant Orders   Lipid Panel w/reflex Direct LDL      Meds ordered this encounter  Medications  . insulin glargine (LANTUS SOLOSTAR) 100 UNIT/ML Solostar Pen    Sig: Inject 28 Units into the skin at bedtime.    Dispense:  15 mL    Refill:  0    DX Code Needed  .    Follow-up: Return in about 3 months (around 08/30/2020) for Diabetes follow-up.    Beatrice Lecher, MD

## 2020-06-02 NOTE — Assessment & Plan Note (Signed)
Due to recheck lipids she is currently on pravastatin and tolerating it well.

## 2020-06-02 NOTE — Assessment & Plan Note (Signed)
Her A1c looks phenomenal today at 6.0.  In fact is a little bit low and I am quite concerned about the frequency of hypoglycemic events.  We will decrease her insulin by 6 units.  She will go down to 28 units daily and keep an eye on her blood sugars over the next couple weeks I asked her to drop off a log or let us know if her blood sugars are still running low as we may need to decrease her room medication even more.  She is due for screening lipid panel.

## 2020-06-03 ENCOUNTER — Encounter: Payer: Medicare Other | Admitting: Physical Therapy

## 2020-06-03 LAB — LIPID PANEL W/REFLEX DIRECT LDL
Cholesterol: 189 mg/dL (ref <200)
HDL: 41 mg/dL — ABNORMAL LOW (ref >=50)
LDL Cholesterol (Calc): 123 mg/dL (calc) — ABNORMAL HIGH
Non-HDL Cholesterol (Calc): 148 mg/dL (calc) — ABNORMAL HIGH (ref <130)
Total CHOL/HDL Ratio: 4.6 (calc) (ref <5.0)
Triglycerides: 141 mg/dL (ref <150)

## 2020-06-05 ENCOUNTER — Encounter: Payer: Medicare Other | Admitting: Physical Therapy

## 2020-06-06 ENCOUNTER — Other Ambulatory Visit: Payer: Self-pay | Admitting: Family Medicine

## 2020-06-06 MED ORDER — ATORVASTATIN CALCIUM 40 MG PO TABS: 40.0000 mg | ORAL_TABLET | Freq: Every day | ORAL | 3 refills | Status: DC

## 2020-06-06 NOTE — Progress Notes (Signed)
Atorvastatin sent over in place or pravastatin to better control cholesterol.

## 2020-06-20 ENCOUNTER — Ambulatory Visit (INDEPENDENT_AMBULATORY_CARE_PROVIDER_SITE_OTHER): Payer: Medicare Other | Admitting: Podiatry

## 2020-06-20 ENCOUNTER — Other Ambulatory Visit: Payer: Self-pay

## 2020-06-20 DIAGNOSIS — M79675 Pain in left toe(s): Secondary | ICD-10-CM | POA: Diagnosis not present

## 2020-06-20 DIAGNOSIS — M79674 Pain in right toe(s): Secondary | ICD-10-CM

## 2020-06-20 DIAGNOSIS — Q828 Other specified congenital malformations of skin: Secondary | ICD-10-CM

## 2020-06-20 DIAGNOSIS — E1169 Type 2 diabetes mellitus with other specified complication: Secondary | ICD-10-CM | POA: Diagnosis not present

## 2020-06-20 DIAGNOSIS — B351 Tinea unguium: Secondary | ICD-10-CM | POA: Diagnosis not present

## 2020-06-20 NOTE — Progress Notes (Signed)
Subjective: 70 y.o. returns the office today for painful, elongated, thickened toenails which she cannot trim herself as well as calluses.  Denies any open lesions or new concerns.  Denies any fevers, chills, nausea, vomiting.  No other concerns today.  PCP: Hali Marry, MD Last Seen: 06/02/2020 Last A1c 6 on 06/02/2020   Objective: AAO 3, NAD DP/PT pulses palpable, CRT less than 3 seconds Nails hypertrophic, dystrophic, elongated, brittle, discolored 10. There is tenderness overlying the nails 1-5 bilaterally. No edema, erythema or signs of infection. Hyperkeratotic tissue bilateral hallux and submetatarsal 1 and left plantar heel.  No underlying ulceration drainage or signs of infection noted today. No other areas of tenderness bilateral lower extremities. No overlying edema, erythema, increased warmth. No pain with calf compression, swelling, warmth, erythema.  Assessment: Patient presents with symptomatic onychomycosis; hyperkeratotic lesions  Plan: -Treatment options including alternatives, risks, complications were discussed -Nails sharply debrided 10 without complication/bleeding.  Prescribed Penlac and directed on use, side effects and success rates. -Hyperkeratotic lesion sharply debrided x4 without complications or bleeding. Offloading and moisturizer daily.  -Follow-up in 3 months or sooner if any problems are to arise. In the meantime, encouraged to call the office with any questions, concerns, changes symptoms.  Celesta Gentile, DPM

## 2020-06-22 NOTE — Op Note (Signed)
**Note Amanda-Identified via Obfuscation** 05/22/2020  5:36 PM  PATIENT:  Amanda Morris  70 y.o. female  PRE-OPERATIVE DIAGNOSIS:  Lumbar wound infection  POST-OPERATIVE DIAGNOSIS:  Lumbar wound infection  PROCEDURE:  Procedure(s): Incision and drainage of lumbar wound  SURGEON: Surgeon(s): Ashok Pall, MD  ASSISTANTS:none  ANESTHESIA:   local and general  EBL:  No intake/output data recorded.  BLOOD ADMINISTERED:none  CELL SAVER GIVEN:none  COUNT:per nursing  DRAINS: none   SPECIMEN:  Source of Specimen:  wound  DICTATION: Amanda Morris was taken to the operating room, intubated, and placed under a general anesthetic without difficulty. She was positioned prone. Her incision was prepped and draped in a sterile manner. I opened the incision where some drainage had been observed. This turned out to be superficial, akin to a stitch abscess. I probed the opening with a hemostat, and dissectors. There was no cavity to fall into, and the drainage was minimal. I irrigated thoroughly then closed the wound in two layers. I applied a sterile dressing. [name] was extubated and taken to the PACU.   PLAN OF CARE: Admit for overnight observation  PATIENT DISPOSITION:  PACU - hemodynamically stable.   Delay start of Pharmacological VTE agent (>24hrs) due to surgical blood loss or risk of bleeding:  no

## 2020-07-04 ENCOUNTER — Other Ambulatory Visit: Payer: Self-pay | Admitting: Family Medicine

## 2020-07-14 ENCOUNTER — Other Ambulatory Visit: Payer: Self-pay

## 2020-07-14 MED ORDER — ACCU-CHEK AVIVA PLUS W/DEVICE KIT: PACK | 99 refills | Status: AC

## 2020-07-17 ENCOUNTER — Telehealth: Payer: Self-pay | Admitting: Family Medicine

## 2020-07-17 DIAGNOSIS — Z981 Arthrodesis status: Secondary | ICD-10-CM

## 2020-07-17 NOTE — Telephone Encounter (Signed)
Okay for pain management referral

## 2020-07-17 NOTE — Telephone Encounter (Signed)
Dr Sharren Bridge called and would like to have a referral to Pain Management for her back pain. Please place order if this is ok and I will send to [facility] for her.   Thank you Jenny Reichmann

## 2020-07-18 NOTE — Telephone Encounter (Signed)
Referral placed.

## 2020-07-18 NOTE — Telephone Encounter (Signed)
I sent referral to [facility] in [place]

## 2020-07-29 ENCOUNTER — Other Ambulatory Visit: Payer: Self-pay | Admitting: Sports Medicine

## 2020-07-29 DIAGNOSIS — Z981 Arthrodesis status: Secondary | ICD-10-CM

## 2020-08-04 DIAGNOSIS — M48062 Spinal stenosis, lumbar region with neurogenic claudication: Secondary | ICD-10-CM | POA: Diagnosis not present

## 2020-08-04 DIAGNOSIS — M4316 Spondylolisthesis, lumbar region: Secondary | ICD-10-CM | POA: Diagnosis not present

## 2020-08-05 ENCOUNTER — Other Ambulatory Visit: Payer: Self-pay | Admitting: Neurosurgery

## 2020-08-05 ENCOUNTER — Encounter: Payer: Self-pay | Admitting: Neurology

## 2020-08-05 ENCOUNTER — Ambulatory Visit (INDEPENDENT_AMBULATORY_CARE_PROVIDER_SITE_OTHER): Payer: Medicare Other | Admitting: Neurology

## 2020-08-05 VITALS — BP 123/86 | HR 100 | Ht 64.0 in | Wt 215.0 lb

## 2020-08-05 DIAGNOSIS — G6289 Other specified polyneuropathies: Secondary | ICD-10-CM

## 2020-08-05 DIAGNOSIS — M4316 Spondylolisthesis, lumbar region: Secondary | ICD-10-CM

## 2020-08-05 DIAGNOSIS — M5442 Lumbago with sciatica, left side: Secondary | ICD-10-CM | POA: Diagnosis not present

## 2020-08-05 DIAGNOSIS — G8929 Other chronic pain: Secondary | ICD-10-CM | POA: Diagnosis not present

## 2020-08-05 DIAGNOSIS — M5441 Lumbago with sciatica, right side: Secondary | ICD-10-CM | POA: Diagnosis not present

## 2020-08-05 NOTE — Patient Instructions (Signed)
Continue Lyrica for now Continue to see Dr. Christella Noa  See you back in 6 months
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
## 2020-08-07 ENCOUNTER — Other Ambulatory Visit: Payer: Self-pay | Admitting: Family Medicine

## 2020-08-07 DIAGNOSIS — G894 Chronic pain syndrome: Secondary | ICD-10-CM | POA: Diagnosis not present

## 2020-08-07 DIAGNOSIS — E119 Type 2 diabetes mellitus without complications: Secondary | ICD-10-CM

## 2020-08-07 DIAGNOSIS — M545 Low back pain, unspecified: Secondary | ICD-10-CM | POA: Diagnosis not present

## 2020-08-07 DIAGNOSIS — Z79899 Other long term (current) drug therapy: Secondary | ICD-10-CM | POA: Diagnosis not present

## 2020-08-07 DIAGNOSIS — M79604 Pain in right leg: Secondary | ICD-10-CM | POA: Diagnosis not present

## 2020-08-07 DIAGNOSIS — I10 Essential (primary) hypertension: Secondary | ICD-10-CM

## 2020-08-07 DIAGNOSIS — M961 Postlaminectomy syndrome, not elsewhere classified: Secondary | ICD-10-CM | POA: Diagnosis not present

## 2020-08-07 DIAGNOSIS — Z79891 Long term (current) use of opiate analgesic: Secondary | ICD-10-CM | POA: Diagnosis not present

## 2020-08-11 ENCOUNTER — Other Ambulatory Visit: Payer: Medicare Other

## 2020-08-13 ENCOUNTER — Other Ambulatory Visit: Payer: Self-pay | Admitting: Neurosurgery

## 2020-08-13 ENCOUNTER — Other Ambulatory Visit (HOSPITAL_COMMUNITY): Payer: Self-pay | Admitting: Neurosurgery

## 2020-08-13 DIAGNOSIS — M4316 Spondylolisthesis, lumbar region: Secondary | ICD-10-CM

## 2020-08-14 DIAGNOSIS — G894 Chronic pain syndrome: Secondary | ICD-10-CM | POA: Diagnosis not present

## 2020-08-14 DIAGNOSIS — Z79899 Other long term (current) drug therapy: Secondary | ICD-10-CM | POA: Diagnosis not present

## 2020-08-14 DIAGNOSIS — M961 Postlaminectomy syndrome, not elsewhere classified: Secondary | ICD-10-CM | POA: Diagnosis not present

## 2020-08-14 DIAGNOSIS — M79604 Pain in right leg: Secondary | ICD-10-CM | POA: Diagnosis not present

## 2020-08-14 DIAGNOSIS — Z981 Arthrodesis status: Secondary | ICD-10-CM | POA: Diagnosis not present

## 2020-08-14 DIAGNOSIS — Z79891 Long term (current) use of opiate analgesic: Secondary | ICD-10-CM | POA: Diagnosis not present

## 2020-08-29 ENCOUNTER — Other Ambulatory Visit: Payer: Self-pay

## 2020-08-29 ENCOUNTER — Ambulatory Visit (HOSPITAL_COMMUNITY)
Admission: RE | Admit: 2020-08-29 | Discharge: 2020-08-29 | Disposition: A | Discharge status: Home / Self Care | Payer: Medicare Other | Source: Ambulatory Visit | Attending: Neurosurgery | Admitting: Neurosurgery

## 2020-08-29 DIAGNOSIS — M4326 Fusion of spine, lumbar region: Secondary | ICD-10-CM | POA: Diagnosis not present

## 2020-08-29 DIAGNOSIS — M4316 Spondylolisthesis, lumbar region: Secondary | ICD-10-CM

## 2020-08-29 DIAGNOSIS — Z981 Arthrodesis status: Secondary | ICD-10-CM | POA: Insufficient documentation

## 2020-08-29 DIAGNOSIS — M545 Low back pain, unspecified: Secondary | ICD-10-CM | POA: Diagnosis not present

## 2020-08-29 DIAGNOSIS — M48061 Spinal stenosis, lumbar region without neurogenic claudication: Secondary | ICD-10-CM | POA: Diagnosis not present

## 2020-08-29 DIAGNOSIS — M5134 Other intervertebral disc degeneration, thoracic region: Secondary | ICD-10-CM | POA: Insufficient documentation

## 2020-08-29 LAB — GLUCOSE, CAPILLARY
Glucose-Capillary: 146 mg/dL — ABNORMAL HIGH (ref 70–99)
Glucose-Capillary: 79 mg/dL (ref 70–99)

## 2020-08-29 MED ORDER — IOHEXOL 180 MG/ML  SOLN
20.0000 mL | Freq: Once | INTRAMUSCULAR | Status: AC | PRN
  Administered 2020-08-29: 20 mL via INTRATHECAL

## 2020-08-29 MED ORDER — DIAZEPAM 5 MG PO TABS
ORAL_TABLET | ORAL | Status: AC
  Filled 2020-08-29: qty 2

## 2020-08-29 MED ORDER — PROCHLORPERAZINE 25 MG RE SUPP
25.0000 mg | Freq: Two times a day (BID) | RECTAL | Status: DC | PRN
  Filled 2020-08-29: qty 1

## 2020-08-29 MED ORDER — ONDANSETRON HCL 4 MG/2ML IJ SOLN: 4.0000 mg | Freq: Four times a day (QID) | INTRAMUSCULAR | Status: DC | PRN

## 2020-08-29 MED ORDER — DIAZEPAM 5 MG PO TABS
10.0000 mg | ORAL_TABLET | Freq: Once | ORAL | Status: AC
  Administered 2020-08-29: 10 mg via ORAL

## 2020-08-29 MED ORDER — LIDOCAINE HCL (PF) 1 % IJ SOLN
5.0000 mL | Freq: Once | INTRAMUSCULAR | Status: AC
  Administered 2020-08-29: 5 mL via INTRADERMAL

## 2020-08-29 MED ORDER — OXYCODONE HCL 5 MG PO TABS
5.0000 mg | ORAL_TABLET | ORAL | Status: DC | PRN
  Administered 2020-08-29: 10 mg via ORAL

## 2020-08-29 MED ORDER — OXYCODONE HCL 5 MG PO TABS
ORAL_TABLET | ORAL | Status: AC
  Filled 2020-08-29: qty 2

## 2020-08-29 NOTE — Op Note (Signed)
*   No surgery found * lumbar Myelogram  PATIENT:  Amanda Morris is a 70 y.o. female with recurrent pain in the lumbar region.   PRE-OPERATIVE DIAGNOSIS:  lumbago  POST-OPERATIVE DIAGNOSIS:  same  PROCEDURE:  Lumbar Myelogram  SURGEON:  Ramondo Dietze  ANESTHESIA:   local LOCAL MEDICATIONS USED:  LIDOCAINE  Procedure Note: Amanda Morris is a 70 y.o. female Was taken to the fluoroscopy suite and  positioned prone on the fluoroscopy table. Her back was prepared and draped in a sterile manner. I infiltrated 6 cc into the lumbar region. I then introduced a spinal needle into the thecal sac at the L4/5 interlaminar space. I infiltrated 20cc of Isovue 180 into the thecal sac. Fluoroscopy showed the needle and contrast in the thecal sac. Amanda Morris tolerated the procedure well. she Will be taken to CT for evaluation.     PATIENT DISPOSITION:  Short Stay

## 2020-09-01 ENCOUNTER — Encounter: Payer: Self-pay | Admitting: Family Medicine

## 2020-09-01 ENCOUNTER — Ambulatory Visit (INDEPENDENT_AMBULATORY_CARE_PROVIDER_SITE_OTHER): Payer: Medicare Other | Admitting: Family Medicine

## 2020-09-01 ENCOUNTER — Other Ambulatory Visit: Payer: Self-pay

## 2020-09-01 ENCOUNTER — Ambulatory Visit: Payer: Medicare Other | Admitting: Family Medicine

## 2020-09-01 VITALS — BP 115/74 | HR 86 | Ht 64.0 in | Wt 209.0 lb

## 2020-09-01 DIAGNOSIS — I10 Essential (primary) hypertension: Secondary | ICD-10-CM | POA: Diagnosis not present

## 2020-09-01 DIAGNOSIS — R112 Nausea with vomiting, unspecified: Secondary | ICD-10-CM | POA: Diagnosis not present

## 2020-09-01 DIAGNOSIS — R110 Nausea: Secondary | ICD-10-CM

## 2020-09-01 DIAGNOSIS — E1169 Type 2 diabetes mellitus with other specified complication: Secondary | ICD-10-CM | POA: Diagnosis not present

## 2020-09-01 LAB — POCT GLYCOSYLATED HEMOGLOBIN (HGB A1C): Hemoglobin A1C: 6.6 % — AB (ref 4.0–5.6)

## 2020-09-01 MED ORDER — GLIPIZIDE 10 MG PO TABS: 10.0000 mg | ORAL_TABLET | Freq: Two times a day (BID) | ORAL | 1 refills | Status: DC

## 2020-09-01 MED ORDER — LEVOCETIRIZINE DIHYDROCHLORIDE 5 MG PO TABS: 5.0000 mg | ORAL_TABLET | Freq: Every evening | ORAL | 3 refills | Status: DC

## 2020-09-01 MED ORDER — EMPAGLIFLOZIN 25 MG PO TABS: ORAL_TABLET | ORAL | 1 refills | Status: DC

## 2020-09-01 MED ORDER — ONDANSETRON 4 MG PO TBDP: 4.0000 mg | ORAL_TABLET | Freq: Three times a day (TID) | ORAL | 0 refills | Status: DC | PRN

## 2020-09-01 NOTE — Assessment & Plan Note (Signed)
Well controlled. Continue current regimen. Follow up in  3-4 mo  

## 2020-09-01 NOTE — Progress Notes (Signed)
Established Patient Office Visit  Subjective:  Patient ID: Amanda Morris, female    DOB: 21-Sep-1950  Age: 70 y.o. MRN: [id number]  CC:  Chief Complaint  Patient presents with  . Diabetes    HPI Amanda Morris presents for   Diabetes - no hypoglycemic events. No wounds or sores that are not healing well. No increased thirst or urination. Checking glucose at home. Taking medications as prescribed without any side effects.  Also her son who was 52 passed away suddenly in 2022-07-08 from a heart attack.  They found him in his apartment.  He was living with someone but she was actually in the hospital during that time so was alone. Says ever since then she is just been experiencing a lot of nausea.  She also feels like she is belching more than usual but no heartburn or reflux.  He has even vomited a few times over the last week..  She just does not feel like she can eat much.  She says her bowels are moving normally.  She does take Dexilant daily.   Past Medical History:  Diagnosis Date  . Allergy   . Diabetes mellitus without complication ([place])    type 2  . GERD (gastroesophageal reflux disease)   . Headache   . History of hiatal hernia   . Hypercholesterolemia   . Numbness    left, outer thigh  . Palpitations    in the past, no current issues per patient  . Postlaminectomy syndrome     Past Surgical History:  Procedure Laterality Date  . ABDOMINAL HYSTERECTOMY  1982  . BACK SURGERY     x 2   . BREAST REDUCTION SURGERY Bilateral 03/17/2016   Procedure: MAMMARY REDUCTION  (BREAST);  Surgeon: Wallace Going, DO;  Location: [facility];  Service: Plastics;  Laterality: Bilateral;  . COLONOSCOPY    . LUMBAR WOUND DEBRIDEMENT N/A 05/22/2020   Procedure: Incision and drainage of lumbar wound;  Surgeon: Ashok Pall, MD;  Location: [facility];  Service: Neurosurgery;  Laterality: N/A;  . SHOULDER SURGERY  06/2008, 09/2010   Right, by Dr. Karie Soda, then  Dr. Judeth Horn  . SPINAL CORD STIMULATOR INSERTION N/A 12/02/2017   Procedure: LUMBAR SPINAL CORD STIMULATOR INSERTION;  Surgeon: Ashok Pall, MD;  Location: [facility];  Service: Neurosurgery;  Laterality: N/A;  . SPINAL CORD STIMULATOR TRIAL N/A 11/25/2017   Procedure: INSERTION LUMBAR SPINAL CORD STIMULATOR TRIAL  ;  Surgeon: Ashok Pall, MD;  Location: [facility];  Service: Neurosurgery;  Laterality: N/A;   INSERTION LUMBAR SPINAL CORD STIMULATOR TRIAL    . UPPER GI ENDOSCOPY      Family History  Problem Relation Age of Onset  . Heart disease Mother   . Diabetes Mother   . Hypertension Mother   . Stroke Mother   . Heart disease Father   . Heart disease Sister   . Diabetes Sister   . Hyperlipidemia Sister   . Hypertension Sister   . Stroke Sister   . Alcohol abuse Brother   . Hyperlipidemia Brother     Social History   Socioeconomic History  . Marital status: Divorced    Spouse name: Not on file  . Number of children: 1  . Years of education: 9th grade  . Highest education level: 9th grade  Occupational History  . Occupation: Disabled.     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 Smoking status: Former Smoker  Types: Cigarettes    Quit date: 07/30/2007    Years since quitting: 13.1  . Smokeless tobacco: Never Used  Vaping Use  . Vaping Use: Never used  Substance and Sexual Activity  . Alcohol use: No  . Drug use: No  . Sexual activity: Not Currently    Birth control/protection: Surgical    Comment: Hysterectomy  Other Topics Concern  . Not on file  Social History Narrative   Lives alone.   Right-handed.   No daily caffeine use.   Social Determinants of Health   Financial Resource Strain: Not on file  Food Insecurity: Not on file  Transportation Needs: Not on file  Physical Activity: Not on file  Stress: Not on file  Social Connections: Not on file  Intimate Partner Violence: Not on file    Outpatient Medications Prior to Visit  Medication  Sig Dispense Refill  . ACCU-CHEK AVIVA PLUS test strip FOR TESTING BLOOD SUGARS 3 TIMES DAILY. DX:E11.9 300 strip 12  . Accu-Chek Softclix Lancets lancets Dx:E11.9 100 each PRN  . aspirin EC 81 MG tablet Take 1 tablet (81 mg total) by mouth daily.    . B-D ULTRAFINE III SHORT PEN 31G X 8 MM MISC USE AS DIRECTED 100 each 6  . Blood Glucose Monitoring Suppl (ACCU-CHEK AVIVA PLUS) w/Device KIT Check blood sugars 3 times daily DX:E11.9 1 kit prn  . cholecalciferol (VITAMIN D) 1000 units tablet Take 1,000 Units by mouth daily.    Marland Kitchen DEXILANT 60 MG capsule Take 60 mg by mouth daily.   3  . diclofenac Sodium (VOLTAREN) 1 % GEL Apply topically 4 (four) times daily.    Marland Kitchen dicyclomine (BENTYL) 20 MG tablet Take 20 mg by mouth daily.    Marland Kitchen doxepin (SINEQUAN) 10 MG capsule Take 10 mg by mouth at bedtime.    . fluticasone (FLONASE) 50 MCG/ACT nasal spray PLACE 1 SPRAY INTO BOTH NOSTRILS DAILY. (Patient taking differently: Place 2 sprays into both nostrils 2 (two) times daily as needed for allergies.) 48 mL 4  . insulin glargine (LANTUS SOLOSTAR) 100 UNIT/ML Solostar Pen Inject 28 Units into the skin at bedtime. (Patient taking differently: Inject 38 Units into the skin at bedtime as needed (high blood sugar).) 15 mL 0  . Krill Oil 500 MG CAPS Take 500 mg by mouth daily.    . Multiple Vitamins-Minerals (MULTIVITAMIN WOMEN 50+ PO) Take 1 capsule by mouth daily.    Marland Kitchen OVER THE COUNTER MEDICATION Take 1 tablet by mouth 3 (three) times daily. Golo otc supplement    . perphenazine (TRILAFON) 8 MG tablet Take 8 mg by mouth at bedtime.  2  . pravastatin (PRAVACHOL) 40 MG tablet Take 1 tablet (40 mg total) by mouth at bedtime. 90 tablet 3  . pregabalin (LYRICA) 200 MG capsule TAKE 1 CAPSULE (200 MG TOTAL) BY MOUTH 3 (THREE) TIMES DAILY. 90 capsule 5  . famotidine (PEPCID) 20 MG tablet Take 60 mg by mouth at bedtime.    Marland Kitchen glipiZIDE (GLUCOTROL) 10 MG tablet TAKE 1 TABLET BY MOUTH TWICE A DAY (Patient taking differently:  Take 10 mg by mouth 2 (two) times daily before a meal.) 180 tablet 1  . JARDIANCE 25 MG TABS tablet TAKE 25 MG BY MOUTH DAILY. (Patient taking differently: Take 25 mg by mouth daily.) 90 tablet 1  . levocetirizine (XYZAL) 5 MG tablet TAKE 1 TABLET BY MOUTH EVERY DAY IN THE EVENING (Patient taking differently: Take 5 mg by mouth every evening.) 90 tablet  3  . atorvastatin (LIPITOR) 40 MG tablet Take 1 tablet (40 mg total) by mouth daily. (Patient not taking: No sig reported) 90 tablet 3  . blood glucose meter kit and supplies KIT Dispense glucometer, test strips, and lancets. Use to check blood sugar twice daily. Dx: E11.9 1 each 0  . tiZANidine (ZANAFLEX) 4 MG tablet Take 1 tablet (4 mg total) by mouth every 8 (eight) hours as needed for muscle spasms. (Patient not taking: Reported on 08/22/2020) 60 tablet 0  . traMADol (ULTRAM) 50 MG tablet TAKE 1 TABLET (50 MG TOTAL) BY MOUTH EVERY 8 (EIGHT) HOURS AS NEEDED FOR MODERATE PAIN (Patient taking differently: Take 50 mg by mouth every 6 (six) hours as needed for moderate pain.) 90 tablet 0   No facility-administered medications prior to visit.    Allergies  Allergen Reactions  . Penicillins Hives and Other (See Comments)    PATIENT HAS HAD A PCN REACTION WITH IMMEDIATE RASH, FACIAL/TONGUE/THROAT SWELLING, SOB, OR LIGHTHEADEDNESS WITH HYPOTENSION:  #  #  YES  #  #  Has patient had a PCN reaction causing severe rash involving mucus membranes or skin necrosis: No Has patient had a PCN reaction that required hospitalization: No Has patient had a PCN reaction occurring within the last 10 years: No If all of the above answers are "NO", then may proceed with Cephalosporin use.   . Duloxetine Other (See Comments)    GI upset  . Aspirin Nausea Only  . Codeine Itching  . Hydrocodone Nausea Only  . Metformin And Related Diarrhea and Nausea Only  . Oxycodone Nausea And Vomiting    GI Upset (intolerance)  . Victoza [Liraglutide] Other (See Comments)     Abdominal pain    ROS Review of Systems    Objective:    Physical Exam Constitutional:      Appearance: She is well-developed.  HENT:     Head: Normocephalic and atraumatic.  Cardiovascular:     Rate and Rhythm: Normal rate and regular rhythm.     Heart sounds: Normal heart sounds.  Pulmonary:     Effort: Pulmonary effort is normal.     Breath sounds: Normal breath sounds.  Skin:    General: Skin is warm and dry.  Neurological:     Mental Status: She is alert and oriented to person, place, and time.  Psychiatric:        Behavior: Behavior normal.     BP 115/74   Pulse 86   Ht 5' 4" (1.626 m)   Wt 209 lb (94.8 kg)   LMP  (LMP Unknown)   SpO2 95%   BMI 35.87 kg/m  Wt Readings from Last 3 Encounters:  09/01/20 209 lb (94.8 kg)  08/29/20 217 lb (98.4 kg)  08/05/20 215 lb (97.5 kg)     Health Maintenance Due  Topic Date Due  . FOOT EXAM  08/16/2020    There are no preventive care reminders to display for this patient.  Lab Results  Component Value Date   TSH 1.650 11/28/2019   Lab Results  Component Value Date   WBC 10.3 05/16/2020   HGB 12.8 05/16/2020   HCT 42.2 05/16/2020   MCV 74.7 (L) 05/16/2020   PLT 287 05/16/2020   Lab Results  Component Value Date   NA 142 05/16/2020   K 4.2 05/16/2020   CO2 25 05/16/2020   GLUCOSE 113 (H) 05/16/2020   BUN 8 05/16/2020   CREATININE 0.73 05/16/2020   BILITOT 0.4  05/16/2020   ALKPHOS 102 11/28/2019   AST 20 05/16/2020   ALT 14 05/16/2020   PROT 7.0 05/16/2020   ALBUMIN 3.9 11/28/2019   CALCIUM 9.1 05/16/2020   ANIONGAP 11 04/14/2020   Lab Results  Component Value Date   CHOL 189 06/02/2020   Lab Results  Component Value Date   HDL 41 (L) 06/02/2020   Lab Results  Component Value Date   LDLCALC 123 (H) 06/02/2020   Lab Results  Component Value Date   TRIG 141 06/02/2020   Lab Results  Component Value Date   CHOLHDL 4.6 06/02/2020   Lab Results  Component Value Date   HGBA1C 6.6 (A)  09/01/2020      Assessment & Plan:   Problem List Items Addressed This Visit      Cardiovascular and Mediastinum   Essential hypertension    Well controlled. Continue current regimen. Follow up in  3-4 mo       Relevant Medications   glipiZIDE (GLUCOTROL) 10 MG tablet     Endocrine   Type 2 diabetes mellitus with other specified complication ([place]) - Primary    Well controlled.  A1c is up to 6.6 which is up from previous but she was having frequent hypoglycemic episodes when she was waking up in the morning.  In looking at her log that she brought in today overall they look phenomenal most of them are between 80 and 120.  But she still has a few scattered blood sugars in the 70s.  Decrease Lantus to 36 units and after 3 to 4 weeks if she still having lows okay to decrease down to 34 units.  Follow up in  3-4 mo       Relevant Medications   empagliflozin (JARDIANCE) 25 MG TABS tablet   glipiZIDE (GLUCOTROL) 10 MG tablet   Other Relevant Orders   POCT glycosylated hemoglobin (Hb A1C) (Completed)    Other Visit Diagnoses    Nausea       Relevant Medications   ondansetron (ZOFRAN ODT) 4 MG disintegrating tablet   Non-intractable vomiting with nausea, unspecified vomiting type          Nausea/vomiting-we will give her some Zofran to use as needed but did encourage her to call her GI to get back in especially if she is not better by the end of the week.  No other known sick contacts.  Meds ordered this encounter  Medications  . empagliflozin (JARDIANCE) 25 MG TABS tablet    Sig: TAKE 25 MG BY MOUTH DAILY.    Dispense:  90 tablet    Refill:  1  . levocetirizine (XYZAL) 5 MG tablet    Sig: Take 1 tablet (5 mg total) by mouth every evening.    Dispense:  90 tablet    Refill:  3  . glipiZIDE (GLUCOTROL) 10 MG tablet    Sig: Take 1 tablet (10 mg total) by mouth 2 (two) times daily before a meal.    Dispense:  180 tablet    Refill:  1  . ondansetron (ZOFRAN ODT) 4 MG  disintegrating tablet    Sig: Take 1 tablet (4 mg total) by mouth every 8 (eight) hours as needed for nausea or vomiting.    Dispense:  20 tablet    Refill:  0    Follow-up: Return in about 3 months (around 12/02/2020) for Diabetes follow-up.    Beatrice Lecher, MD

## 2020-09-01 NOTE — Assessment & Plan Note (Signed)
Well controlled.  A1c is up to 6.6 which is up from previous but she was having frequent hypoglycemic episodes when she was waking up in the morning.  In looking at her log that she brought in today overall they look phenomenal most of them are between 80 and 120.  But she still has a few scattered blood sugars in the 70s.  Decrease Lantus to 36 units and after 3 to 4 weeks if she still having lows okay to decrease down to 34 units.  Follow up in  3-4 mo

## 2020-09-01 NOTE — Patient Instructions (Signed)
This decrease insulin to 36 units daily.  After 3 to 4 weeks if you are still having frequent lows, less than 75, then decrease to 34 units.

## 2020-09-02 ENCOUNTER — Ambulatory Visit: Payer: Medicare Other | Admitting: Family Medicine

## 2020-09-04 DIAGNOSIS — G894 Chronic pain syndrome: Secondary | ICD-10-CM | POA: Diagnosis not present

## 2020-09-04 DIAGNOSIS — M545 Low back pain, unspecified: Secondary | ICD-10-CM | POA: Diagnosis not present

## 2020-09-04 DIAGNOSIS — M79604 Pain in right leg: Secondary | ICD-10-CM | POA: Diagnosis not present

## 2020-09-04 DIAGNOSIS — M961 Postlaminectomy syndrome, not elsewhere classified: Secondary | ICD-10-CM | POA: Diagnosis not present

## 2020-09-09 DIAGNOSIS — R030 Elevated blood-pressure reading, without diagnosis of hypertension: Secondary | ICD-10-CM | POA: Diagnosis not present

## 2020-09-09 DIAGNOSIS — M48062 Spinal stenosis, lumbar region with neurogenic claudication: Secondary | ICD-10-CM | POA: Diagnosis not present

## 2020-09-09 DIAGNOSIS — M4316 Spondylolisthesis, lumbar region: Secondary | ICD-10-CM | POA: Diagnosis not present

## 2020-09-19 ENCOUNTER — Encounter: Payer: Self-pay | Admitting: Podiatry

## 2020-09-19 ENCOUNTER — Ambulatory Visit (INDEPENDENT_AMBULATORY_CARE_PROVIDER_SITE_OTHER): Payer: Medicare Other

## 2020-09-19 ENCOUNTER — Ambulatory Visit (INDEPENDENT_AMBULATORY_CARE_PROVIDER_SITE_OTHER): Payer: Medicare Other | Admitting: Podiatry

## 2020-09-19 ENCOUNTER — Other Ambulatory Visit: Payer: Self-pay

## 2020-09-19 DIAGNOSIS — L84 Corns and callosities: Secondary | ICD-10-CM

## 2020-09-19 DIAGNOSIS — S90852S Superficial foreign body, left foot, sequela: Secondary | ICD-10-CM

## 2020-09-19 DIAGNOSIS — B351 Tinea unguium: Secondary | ICD-10-CM

## 2020-09-19 DIAGNOSIS — E1169 Type 2 diabetes mellitus with other specified complication: Secondary | ICD-10-CM | POA: Diagnosis not present

## 2020-09-19 DIAGNOSIS — M2012 Hallux valgus (acquired), left foot: Secondary | ICD-10-CM | POA: Diagnosis not present

## 2020-09-19 DIAGNOSIS — M79675 Pain in left toe(s): Secondary | ICD-10-CM

## 2020-09-19 DIAGNOSIS — S90852A Superficial foreign body, left foot, initial encounter: Secondary | ICD-10-CM | POA: Diagnosis not present

## 2020-09-19 DIAGNOSIS — M19072 Primary osteoarthritis, left ankle and foot: Secondary | ICD-10-CM | POA: Diagnosis not present

## 2020-09-19 DIAGNOSIS — M79674 Pain in right toe(s): Secondary | ICD-10-CM

## 2020-09-19 DIAGNOSIS — M7732 Calcaneal spur, left foot: Secondary | ICD-10-CM | POA: Diagnosis not present

## 2020-09-20 NOTE — Progress Notes (Signed)
Subjective: 71 y.o. returns the office today for painful, elongated, thickened toenails which she cannot trim herself as well as calluses. No open lesions she reports.  She gets a thick callus to the bottom of her left heel.  She states that she stepped on a nail years ago.  She denies any pain or swelling or redness.  Denies any open lesions or new concerns.  Denies any fevers, chills, nausea, vomiting.  No other concerns today.  PCP: Hali Marry, MD Last Seen: 09/01/2020 Last A1c 6.6 on 09/01/2020  Objective: AAO 3, NAD DP/PT pulses palpable, CRT less than 3 seconds Nails hypertrophic, dystrophic, elongated, brittle, discolored 10. There is tenderness overlying the nails 1-5 bilaterally. No edema, erythema or signs of infection. Hyperkeratotic tissue bilateral hallux and submetatarsal 1 and left plantar heel.  No underlying ulceration drainage or signs of infection noted today.  No evidence of puncture wound or foreign body. No pain with calf compression, swelling, warmth, erythema.  Assessment: Patient presents with symptomatic onychomycosis; hyperkeratotic lesions  Plan: -Treatment options including alternatives, risks, complications were discussed -Nails sharply debrided 10 without complication/bleeding.  Prescribed Penlac and directed on use, side effects and success rates. -Hyperkeratotic lesion sharply debrided x4 without complications or bleeding. Offloading and moisturizer daily.  -I did order x-ray of the left foot to rule out any foreign body or other pathology causing the plantar left callus.  Recommend moisturizer which I dispensed today as well as a heel pad. -Follow-up in 3 months or sooner if any problems are to arise. In the meantime, encouraged to call the office with any questions, concerns, changes symptoms.  Celesta Gentile, DPM

## 2020-09-24 ENCOUNTER — Other Ambulatory Visit: Payer: Self-pay | Admitting: Family Medicine

## 2020-09-25 ENCOUNTER — Telehealth: Payer: Self-pay | Admitting: *Deleted

## 2020-09-25 NOTE — Telephone Encounter (Signed)
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 No evidence of foreign body.

## 2020-09-25 NOTE — Telephone Encounter (Signed)
Called and spoke with the patient today and relayed the message per Dr Jacqualyn Posey. [name]

## 2020-09-30 ENCOUNTER — Other Ambulatory Visit: Payer: Self-pay | Admitting: Neurosurgery

## 2020-10-02 DIAGNOSIS — M545 Low back pain, unspecified: Secondary | ICD-10-CM | POA: Diagnosis not present

## 2020-10-02 DIAGNOSIS — M79605 Pain in left leg: Secondary | ICD-10-CM | POA: Diagnosis not present

## 2020-10-02 DIAGNOSIS — M961 Postlaminectomy syndrome, not elsewhere classified: Secondary | ICD-10-CM | POA: Diagnosis not present

## 2020-10-02 DIAGNOSIS — G894 Chronic pain syndrome: Secondary | ICD-10-CM | POA: Diagnosis not present

## 2020-10-06 NOTE — Pre-Procedure Instructions (Deleted)
Surgical Instructions    Your procedure is scheduled on Thursday June 16th.  Report to [facility] Main Entrance "A" at 11:00A.M., then check in with the Admitting office.  Call this number if you have problems the morning of surgery:  [phone]   If you have any questions prior to your surgery date call [phone]: Open Monday-Friday 8am-4pm    Remember:  Do not eat or drink after midnight the night before your surgery    Take these medicines the morning of surgery with A SIP OF WATER   atorvastatin (LIPITOR)  DEXILANT   dicyclomine (BENTYL)   ondansetron (ZOFRAN ODT)- If needed  fluticasone (FLONASE)- If needed  pregabalin (LYRICA)  traMADol (ULTRAM)    As of today, STOP taking any Aspirin (unless otherwise instructed by your surgeon) Aleve, Naproxen, Ibuprofen, Motrin, Advil, Goody's, BC's, all herbal medications, fish oil, and all vitamins.  WHAT DO I DO ABOUT MY DIABETES MEDICATION?   Do not take oral diabetes medicines (pills) the morning of surgery.  Do not take glipiZIDE (GLUCOTROL) the evening before surgery or the morning of surgery.  Do not take empagliflozin (JARDIANCE) the day before surgery or the morning of surgery.   THE NIGHT BEFORE SURGERY, take 19 units of insulin glargine (LANTUS SOLOSTAR) insulin if needed.       The day of surgery, do not take other diabetes injectables, including Byetta (exenatide), Bydureon (exenatide ER), Victoza (liraglutide), or Trulicity (dulaglutide).  If your CBG is greater than 220 mg/dL, you may take  of your sliding scale (correction) dose of insulin.   HOW TO MANAGE YOUR DIABETES BEFORE AND AFTER SURGERY  Why is it important to control my blood sugar before and after surgery? Improving blood sugar levels before and after surgery helps healing and can limit problems. A way of improving blood sugar control is eating a healthy diet by:  Eating less sugar and carbohydrates  Increasing activity/exercise  Talking with  your doctor about reaching your blood sugar goals High blood sugars (greater than 180 mg/dL) can raise your risk of infections and slow your recovery, so you will need to focus on controlling your diabetes during the weeks before surgery. Make sure that the doctor who takes care of your diabetes knows about your planned surgery including the date and location.  How do I manage my blood sugar before surgery? Check your blood sugar at least 4 times a day, starting 2 days before surgery, to make sure that the level is not too high or low.  Check your blood sugar the morning of your surgery when you wake up and every 2 hours until you get to the Short Stay unit.  If your blood sugar is less than 70 mg/dL, you will need to treat for low blood sugar: Do not take insulin. Treat a low blood sugar (less than 70 mg/dL) with  cup of clear juice (cranberry or apple), 4 glucose tablets, OR glucose gel. Recheck blood sugar in 15 minutes after treatment (to make sure it is greater than 70 mg/dL). If your blood sugar is not greater than 70 mg/dL on recheck, call [phone] for further instructions. Report your blood sugar to the short stay nurse when you get to Short Stay.  If you are admitted to the hospital after surgery: Your blood sugar will be checked by the staff and you will probably be given insulin after surgery (instead of oral diabetes medicines) to make sure you have good blood sugar levels. The goal for blood  sugar control after surgery is 80-180 mg/dL.           Do not wear jewelry or makeup Do not wear lotions, powders, perfumes, or deodorant. Do not shave 48 hours prior to surgery.   Do not bring valuables to the hospital. DO Not wear nail polish, gel polish, artificial nails, or any other type of covering on natural nails including finger and toenails. If patients have artificial nails, gel coating, etc. that need to be removed by a nail salon please have this removed prior to surgery or  surgery may need to be canceled/delayed if the surgeon/ anesthesia feels like the patient is unable to be adequately monitored.             [facility] is not responsible for any belongings or valuables.  Do NOT Smoke (Tobacco/Vaping) or drink Alcohol 24 hours prior to your procedure If you use a CPAP at night, you may bring all equipment for your overnight stay.   Contacts, glasses, dentures or partials may not be worn into surgery, please bring cases for these belongings   For patients admitted to the hospital, discharge time will be determined by your treatment team.   Patients discharged the day of surgery will not be allowed to drive home, and someone needs to stay with them for 24 hours.  ONLY 1 SUPPORT PERSON MAY BE PRESENT WHILE YOU ARE IN SURGERY. IF YOU ARE TO BE ADMITTED ONCE YOU ARE IN YOUR ROOM YOU WILL BE ALLOWED TWO (2) VISITORS.  Minor children may have two parents present. Special consideration for safety and communication needs will be reviewed on a case by case basis.  Special instructions:    Oral Hygiene is also important to reduce your risk of infection.  Remember - BRUSH YOUR TEETH THE MORNING OF SURGERY WITH YOUR REGULAR TOOTHPASTE   [facility]- Preparing For Surgery  Before surgery, you can play an important role. Because skin is not sterile, your skin needs to be as free of germs as possible. You can reduce the number of germs on your skin by washing with CHG (chlorahexidine gluconate) Soap before surgery.  CHG is an antiseptic cleaner which kills germs and bonds with the skin to continue killing germs even after washing.     Please do not use if you have an allergy to CHG or antibacterial soaps. If your skin becomes reddened/irritated stop using the CHG.  Do not shave (including legs and underarms) for at least 48 hours prior to first CHG shower. It is OK to shave your face.  Please follow these instructions carefully.     Shower the NIGHT BEFORE SURGERY and  the MORNING OF SURGERY with CHG Soap.   If you chose to wash your hair, wash your hair first as usual with your normal shampoo. After you shampoo, rinse your hair and body thoroughly to remove the shampoo.  Then [employer] and genitals (private parts) with your normal soap and rinse thoroughly to remove soap.  After that Use CHG Soap as you would any other liquid soap. You can apply CHG directly to the skin and wash gently with a scrungie or a clean washcloth.   Apply the CHG Soap to your body ONLY FROM THE NECK DOWN.  Do not use on open wounds or open sores. Avoid contact with your eyes, ears, mouth and genitals (private parts). Wash Face and genitals (private parts)  with your normal soap.   Wash thoroughly, paying special attention to the  area where your surgery will be performed.  Thoroughly rinse your body with warm water from the neck down.  DO NOT shower/wash with your normal soap after using and rinsing off the CHG Soap.  Pat yourself dry with a CLEAN TOWEL.  Wear CLEAN PAJAMAS to bed the night before surgery  Place CLEAN SHEETS on your bed the night before your surgery  DO NOT SLEEP WITH PETS.   Day of Surgery:  Take a shower with CHG soap. Wear Clean/Comfortable clothing the morning of surgery Do not apply any deodorants/lotions.   Remember to brush your teeth WITH YOUR REGULAR TOOTHPASTE.   Please read over the following fact sheets that you were given.

## 2020-10-07 ENCOUNTER — Encounter (HOSPITAL_COMMUNITY): Payer: Self-pay

## 2020-10-07 ENCOUNTER — Encounter (HOSPITAL_COMMUNITY)
Admission: RE | Admit: 2020-10-07 | Discharge: 2020-10-07 | Disposition: A | Discharge status: Home / Self Care | Payer: Medicare Other | Source: Ambulatory Visit | Attending: Neurosurgery | Admitting: Neurosurgery

## 2020-10-07 ENCOUNTER — Other Ambulatory Visit: Payer: Self-pay

## 2020-10-07 DIAGNOSIS — Z87891 Personal history of nicotine dependence: Secondary | ICD-10-CM | POA: Diagnosis not present

## 2020-10-07 DIAGNOSIS — E7800 Pure hypercholesterolemia, unspecified: Secondary | ICD-10-CM | POA: Diagnosis not present

## 2020-10-07 DIAGNOSIS — Z7982 Long term (current) use of aspirin: Secondary | ICD-10-CM | POA: Diagnosis not present

## 2020-10-07 DIAGNOSIS — Z01812 Encounter for preprocedural laboratory examination: Secondary | ICD-10-CM | POA: Insufficient documentation

## 2020-10-07 DIAGNOSIS — Z79899 Other long term (current) drug therapy: Secondary | ICD-10-CM | POA: Diagnosis not present

## 2020-10-07 DIAGNOSIS — M4807 Spinal stenosis, lumbosacral region: Secondary | ICD-10-CM | POA: Diagnosis not present

## 2020-10-07 DIAGNOSIS — Z833 Family history of diabetes mellitus: Secondary | ICD-10-CM | POA: Diagnosis not present

## 2020-10-07 DIAGNOSIS — Z20822 Contact with and (suspected) exposure to covid-19: Secondary | ICD-10-CM | POA: Diagnosis not present

## 2020-10-07 DIAGNOSIS — E119 Type 2 diabetes mellitus without complications: Secondary | ICD-10-CM | POA: Diagnosis not present

## 2020-10-07 DIAGNOSIS — Z7984 Long term (current) use of oral hypoglycemic drugs: Secondary | ICD-10-CM | POA: Diagnosis not present

## 2020-10-07 DIAGNOSIS — K219 Gastro-esophageal reflux disease without esophagitis: Secondary | ICD-10-CM | POA: Diagnosis not present

## 2020-10-07 DIAGNOSIS — M4317 Spondylolisthesis, lumbosacral region: Secondary | ICD-10-CM | POA: Diagnosis not present

## 2020-10-07 LAB — TYPE AND SCREEN
ABO/RH(D): B NEG
Antibody Screen: NEGATIVE

## 2020-10-07 LAB — SURGICAL PCR SCREEN
MRSA, PCR: NEGATIVE
Staphylococcus aureus: NEGATIVE

## 2020-10-07 LAB — CBC
HCT: 42.5 % (ref 36.0–46.0)
Hemoglobin: 12.8 g/dL (ref 12.0–15.0)
MCH: 22.7 pg — ABNORMAL LOW (ref 26.0–34.0)
MCHC: 30.1 g/dL (ref 30.0–36.0)
MCV: 75.4 fL — ABNORMAL LOW (ref 80.0–100.0)
Platelets: 266 10*3/uL (ref 150–400)
RBC: 5.64 MIL/uL — ABNORMAL HIGH (ref 3.87–5.11)
RDW: 18.6 % — ABNORMAL HIGH (ref 11.5–15.5)
WBC: 9.6 10*3/uL (ref 4.0–10.5)
nRBC: 0 % (ref 0.0–0.2)

## 2020-10-07 LAB — BASIC METABOLIC PANEL
Anion gap: 7 (ref 5–15)
BUN: 7 mg/dL — ABNORMAL LOW (ref 8–23)
CO2: 24 mmol/L (ref 22–32)
Calcium: 8.6 mg/dL — ABNORMAL LOW (ref 8.9–10.3)
Chloride: 106 mmol/L (ref 98–111)
Creatinine, Ser: 0.81 mg/dL (ref 0.44–1.00)
GFR, Estimated: >60 mL/min (ref >60)
Glucose, Bld: 199 mg/dL — ABNORMAL HIGH (ref 70–99)
Potassium: 3.8 mmol/L (ref 3.5–5.1)
Sodium: 137 mmol/L (ref 135–145)

## 2020-10-07 LAB — SARS CORONAVIRUS 2 (TAT 6-24 HRS): SARS Coronavirus 2: NEGATIVE

## 2020-10-07 LAB — GLUCOSE, CAPILLARY: Glucose-Capillary: 195 mg/dL — ABNORMAL HIGH (ref 70–99)

## 2020-10-07 NOTE — Pre-Procedure Instructions (Signed)
Surgical Instructions    Your procedure is scheduled on Thursday June 16th.  Report to [facility] Main Entrance "A" at 11:00A.M., then check in with the Admitting office.  Call this number if you have problems the morning of surgery:  [phone]   If you have any questions prior to your surgery date call [phone]: Open Monday-Friday 8am-4pm    Remember:  Do not eat or drink after midnight the night before your surgery    Take these medicines the morning of surgery with A SIP OF WATER   DEXILANT   ondansetron (ZOFRAN ODT)- If needed  fluticasone (FLONASE)- If needed  pregabalin (LYRICA)  traMADol (ULTRAM)    As of today, STOP taking any Aspirin (unless otherwise instructed by your surgeon) Aleve, Naproxen, Ibuprofen, Motrin, Advil, Goody's, BC's, all herbal medications, fish oil, and all vitamins.  WHAT DO I DO ABOUT MY DIABETES MEDICATION?   Do not take oral diabetes medicines (pills) the morning of surgery.  Do not take glipiZIDE (GLUCOTROL) the evening before surgery or the morning of surgery.  Do not take empagliflozin (JARDIANCE) the day before surgery or the morning of surgery.   THE NIGHT BEFORE SURGERY, take 18 units of insulin glargine (LANTUS SOLOSTAR) insulin if needed.       The day of surgery, do not take other diabetes injectables, including Byetta (exenatide), Bydureon (exenatide ER), Victoza (liraglutide), or Trulicity (dulaglutide).  If your CBG is greater than 220 mg/dL, you may take  of your sliding scale (correction) dose of insulin.   HOW TO MANAGE YOUR DIABETES BEFORE AND AFTER SURGERY  Why is it important to control my blood sugar before and after surgery? Improving blood sugar levels before and after surgery helps healing and can limit problems. A way of improving blood sugar control is eating a healthy diet by:  Eating less sugar and carbohydrates  Increasing activity/exercise  Talking with your doctor about reaching your blood sugar  goals High blood sugars (greater than 180 mg/dL) can raise your risk of infections and slow your recovery, so you will need to focus on controlling your diabetes during the weeks before surgery. Make sure that the doctor who takes care of your diabetes knows about your planned surgery including the date and location.  How do I manage my blood sugar before surgery? Check your blood sugar at least 4 times a day, starting 2 days before surgery, to make sure that the level is not too high or low.  Check your blood sugar the morning of your surgery when you wake up and every 2 hours until you get to the Short Stay unit.  If your blood sugar is less than 70 mg/dL, you will need to treat for low blood sugar: Do not take insulin. Treat a low blood sugar (less than 70 mg/dL) with  cup of clear juice (cranberry or apple), 4 glucose tablets, OR glucose gel. Recheck blood sugar in 15 minutes after treatment (to make sure it is greater than 70 mg/dL). If your blood sugar is not greater than 70 mg/dL on recheck, call [phone] for further instructions. Report your blood sugar to the short stay nurse when you get to Short Stay.  If you are admitted to the hospital after surgery: Your blood sugar will be checked by the staff and you will probably be given insulin after surgery (instead of oral diabetes medicines) to make sure you have good blood sugar levels. The goal for blood sugar control after surgery is 80-180 mg/dL.  Do not wear jewelry or makeup Do not wear lotions, powders, perfumes, or deodorant. Do not shave 48 hours prior to surgery.   Do not bring valuables to the hospital. DO Not wear nail polish, gel polish, artificial nails, or any other type of covering on natural nails including finger and toenails. If patients have artificial nails, gel coating, etc. that need to be removed by a nail salon please have this removed prior to surgery or surgery may need to be canceled/delayed if  the surgeon/ anesthesia feels like the patient is unable to be adequately monitored.             [facility] is not responsible for any belongings or valuables.  Do NOT Smoke (Tobacco/Vaping) or drink Alcohol 24 hours prior to your procedure If you use a CPAP at night, you may bring all equipment for your overnight stay.   Contacts, glasses, dentures or partials may not be worn into surgery, please bring cases for these belongings   For patients admitted to the hospital, discharge time will be determined by your treatment team.   Patients discharged the day of surgery will not be allowed to drive home, and someone needs to stay with them for 24 hours.  ONLY 1 SUPPORT PERSON MAY BE PRESENT WHILE YOU ARE IN SURGERY. IF YOU ARE TO BE ADMITTED ONCE YOU ARE IN YOUR ROOM YOU WILL BE ALLOWED TWO (2) VISITORS.  Minor children may have two parents present. Special consideration for safety and communication needs will be reviewed on a case by case basis.  Special instructions:    Oral Hygiene is also important to reduce your risk of infection.  Remember - BRUSH YOUR TEETH THE MORNING OF SURGERY WITH YOUR REGULAR TOOTHPASTE   [place]- Preparing For Surgery  Before surgery, you can play an important role. Because skin is not sterile, your skin needs to be as free of germs as possible. You can reduce the number of germs on your skin by washing with CHG (chlorahexidine gluconate) Soap before surgery.  CHG is an antiseptic cleaner which kills germs and bonds with the skin to continue killing germs even after washing.     Please do not use if you have an allergy to CHG or antibacterial soaps. If your skin becomes reddened/irritated stop using the CHG.  Do not shave (including legs and underarms) for at least 48 hours prior to first CHG shower. It is OK to shave your face.  Please follow these instructions carefully.     Shower the NIGHT BEFORE SURGERY and the MORNING OF SURGERY with CHG Soap.    If you chose to wash your hair, wash your hair first as usual with your normal shampoo. After you shampoo, rinse your hair and body thoroughly to remove the shampoo.  Then [employer] and genitals (private parts) with your normal soap and rinse thoroughly to remove soap.  After that Use CHG Soap as you would any other liquid soap. You can apply CHG directly to the skin and wash gently with a scrungie or a clean washcloth.   Apply the CHG Soap to your body ONLY FROM THE NECK DOWN.  Do not use on open wounds or open sores. Avoid contact with your eyes, ears, mouth and genitals (private parts). Wash Face and genitals (private parts)  with your normal soap.   Wash thoroughly, paying special attention to the area where your surgery will be performed.  Thoroughly rinse your body with warm water from the  neck down.  DO NOT shower/wash with your normal soap after using and rinsing off the CHG Soap.  Pat yourself dry with a CLEAN TOWEL.  Wear CLEAN PAJAMAS to bed the night before surgery  Place CLEAN SHEETS on your bed the night before your surgery  DO NOT SLEEP WITH PETS.   Day of Surgery:  Take a shower with CHG soap. Wear Clean/Comfortable clothing the morning of surgery Do not apply any deodorants/lotions.   Remember to brush your teeth WITH YOUR REGULAR TOOTHPASTE.   Please read over the following fact sheets that you were given.

## 2020-10-07 NOTE — Progress Notes (Signed)
PCP - Dr. Minna Merritts-  [facility] Cardiologist - denies  PPM/ICD - n/a Device Orders - n/a Rep Notified - n/a  Chest x-ray - n/a EKG - 05/22/20 Stress Test - denies ECHO - denies Cardiac Cath - denies  Sleep Study - denies CPAP - n/a  CBG today= 195. Had crackers and water this am.  Checks Blood Sugar three times a day. Per patient normally ranges from the 90's to 120's. A1C 6.6 on 09/01/20  Blood Thinner Instructions: Aspirin Instructions: Per patient her last dose of Aspirin was on 09/30/20 per her surgeon's instructions  COVID TEST- 10/07/20. Pending   Anesthesia review: Yes. Per Dr. Morrie Sheldon note on 09/01/20 patient was having frequent hypoglycemic episodes when waking up in the morning. Patient states her lowest blood sugar has not been below 80 recently. Reviewed diabetes medicine instructions with patient and she verbalized understanding.   Patient denies shortness of breath, fever, cough and chest pain at PAT appointment   All instructions explained to the patient, with a verbal understanding of the material. Patient agrees to go over the instructions while at home for a better understanding. Patient also instructed to self quarantine after being tested for COVID-19. The opportunity to ask questions was provided.

## 2020-10-08 MED ORDER — VANCOMYCIN HCL 1500 MG/300ML IV SOLN
1500.0000 mg | INTRAVENOUS | Status: AC
  Administered 2020-10-09: 1500 mg via INTRAVENOUS
  Filled 2020-10-08: qty 300

## 2020-10-09 ENCOUNTER — Encounter (HOSPITAL_COMMUNITY)
Admission: RE | Disposition: A | Discharge status: Home / Self Care | Payer: Self-pay | Source: Home / Self Care | Attending: Neurosurgery

## 2020-10-09 ENCOUNTER — Ambulatory Visit (HOSPITAL_COMMUNITY): Payer: Medicare Other

## 2020-10-09 ENCOUNTER — Inpatient Hospital Stay (HOSPITAL_COMMUNITY)
Admission: RE | Admit: 2020-10-09 | Discharge: 2020-10-10 | DRG: 460 | Disposition: A | Discharge status: Home / Self Care | Payer: Medicare Other | Attending: Neurosurgery | Admitting: Neurosurgery

## 2020-10-09 ENCOUNTER — Ambulatory Visit (HOSPITAL_COMMUNITY): Payer: Medicare Other | Admitting: Physician Assistant

## 2020-10-09 ENCOUNTER — Encounter (HOSPITAL_COMMUNITY): Payer: Self-pay | Admitting: Neurosurgery

## 2020-10-09 ENCOUNTER — Other Ambulatory Visit: Payer: Self-pay

## 2020-10-09 ENCOUNTER — Ambulatory Visit (HOSPITAL_COMMUNITY): Payer: Medicare Other | Admitting: Anesthesiology

## 2020-10-09 DIAGNOSIS — M5136 Other intervertebral disc degeneration, lumbar region: Secondary | ICD-10-CM

## 2020-10-09 DIAGNOSIS — M4800 Spinal stenosis, site unspecified: Secondary | ICD-10-CM | POA: Diagnosis present

## 2020-10-09 DIAGNOSIS — Z7984 Long term (current) use of oral hypoglycemic drugs: Secondary | ICD-10-CM

## 2020-10-09 DIAGNOSIS — Z90710 Acquired absence of both cervix and uterus: Secondary | ICD-10-CM

## 2020-10-09 DIAGNOSIS — K219 Gastro-esophageal reflux disease without esophagitis: Secondary | ICD-10-CM | POA: Diagnosis present

## 2020-10-09 DIAGNOSIS — Z87891 Personal history of nicotine dependence: Secondary | ICD-10-CM

## 2020-10-09 DIAGNOSIS — M4317 Spondylolisthesis, lumbosacral region: Secondary | ICD-10-CM | POA: Diagnosis present

## 2020-10-09 DIAGNOSIS — M51369 Other intervertebral disc degeneration, lumbar region without mention of lumbar back pain or lower extremity pain: Secondary | ICD-10-CM

## 2020-10-09 DIAGNOSIS — M48062 Spinal stenosis, lumbar region with neurogenic claudication: Secondary | ICD-10-CM | POA: Diagnosis not present

## 2020-10-09 DIAGNOSIS — E119 Type 2 diabetes mellitus without complications: Secondary | ICD-10-CM | POA: Diagnosis not present

## 2020-10-09 DIAGNOSIS — Z20822 Contact with and (suspected) exposure to covid-19: Secondary | ICD-10-CM | POA: Diagnosis present

## 2020-10-09 DIAGNOSIS — Z7982 Long term (current) use of aspirin: Secondary | ICD-10-CM | POA: Diagnosis not present

## 2020-10-09 DIAGNOSIS — Z833 Family history of diabetes mellitus: Secondary | ICD-10-CM

## 2020-10-09 DIAGNOSIS — E7800 Pure hypercholesterolemia, unspecified: Secondary | ICD-10-CM | POA: Diagnosis not present

## 2020-10-09 DIAGNOSIS — Z79899 Other long term (current) drug therapy: Secondary | ICD-10-CM | POA: Diagnosis not present

## 2020-10-09 DIAGNOSIS — I471 Supraventricular tachycardia: Secondary | ICD-10-CM | POA: Diagnosis not present

## 2020-10-09 DIAGNOSIS — E1169 Type 2 diabetes mellitus with other specified complication: Secondary | ICD-10-CM

## 2020-10-09 DIAGNOSIS — M4807 Spinal stenosis, lumbosacral region: Principal | ICD-10-CM | POA: Diagnosis present

## 2020-10-09 DIAGNOSIS — Z4889 Encounter for other specified surgical aftercare: Secondary | ICD-10-CM | POA: Diagnosis not present

## 2020-10-09 DIAGNOSIS — Z419 Encounter for procedure for purposes other than remedying health state, unspecified: Secondary | ICD-10-CM

## 2020-10-09 DIAGNOSIS — M4316 Spondylolisthesis, lumbar region: Secondary | ICD-10-CM

## 2020-10-09 LAB — GLUCOSE, CAPILLARY
Glucose-Capillary: 120 mg/dL — ABNORMAL HIGH (ref 70–99)
Glucose-Capillary: 123 mg/dL — ABNORMAL HIGH (ref 70–99)
Glucose-Capillary: 147 mg/dL — ABNORMAL HIGH (ref 70–99)
Glucose-Capillary: 170 mg/dL — ABNORMAL HIGH (ref 70–99)
Glucose-Capillary: 181 mg/dL — ABNORMAL HIGH (ref 70–99)
Glucose-Capillary: 72 mg/dL (ref 70–99)

## 2020-10-09 LAB — POCT I-STAT, CHEM 8
BUN: 9 mg/dL (ref 8–23)
Calcium, Ion: 1.19 mmol/L (ref 1.15–1.40)
Chloride: 107 mmol/L (ref 98–111)
Creatinine, Ser: 0.6 mg/dL (ref 0.44–1.00)
Glucose, Bld: 88 mg/dL (ref 70–99)
HCT: 37 % (ref 36.0–46.0)
Hemoglobin: 12.6 g/dL (ref 12.0–15.0)
Potassium: 3.7 mmol/L (ref 3.5–5.1)
Sodium: 145 mmol/L (ref 135–145)
TCO2: 27 mmol/L (ref 22–32)

## 2020-10-09 SURGERY — POSTERIOR LUMBAR FUSION 1 LEVEL
Anesthesia: General | Site: Spine Lumbar

## 2020-10-09 MED ORDER — KETOROLAC TROMETHAMINE 15 MG/ML IJ SOLN
7.5000 mg | Freq: Four times a day (QID) | INTRAMUSCULAR | Status: DC
  Administered 2020-10-09 – 2020-10-10 (×2): 7.5 mg via INTRAVENOUS
  Filled 2020-10-09 (×2): qty 1

## 2020-10-09 MED ORDER — ACETAMINOPHEN 650 MG RE SUPP: 650.0000 mg | RECTAL | Status: DC | PRN

## 2020-10-09 MED ORDER — POTASSIUM CHLORIDE IN NACL 20-0.9 MEQ/L-% IV SOLN: INTRAVENOUS | Status: DC

## 2020-10-09 MED ORDER — FENTANYL CITRATE (PF) 250 MCG/5ML IJ SOLN
INTRAMUSCULAR | Status: AC
  Filled 2020-10-09: qty 5

## 2020-10-09 MED ORDER — THROMBIN 20000 UNITS EX SOLR
CUTANEOUS | Status: AC
  Filled 2020-10-09: qty 20000

## 2020-10-09 MED ORDER — SODIUM CHLORIDE 0.9% FLUSH: 3.0000 mL | INTRAVENOUS | Status: DC | PRN

## 2020-10-09 MED ORDER — DEXTROSE 50 % IV SOLN
INTRAVENOUS | Status: AC
  Filled 2020-10-09: qty 50

## 2020-10-09 MED ORDER — THROMBIN 20000 UNITS EX SOLR: CUTANEOUS | Status: DC | PRN

## 2020-10-09 MED ORDER — LIDOCAINE-EPINEPHRINE 0.5 %-1:200000 IJ SOLN
INTRAMUSCULAR | Status: DC | PRN
  Administered 2020-10-09: 10 mL

## 2020-10-09 MED ORDER — PERPHENAZINE 4 MG PO TABS
8.0000 mg | ORAL_TABLET | Freq: Every day | ORAL | Status: DC
  Administered 2020-10-09: 8 mg via ORAL
  Filled 2020-10-09: qty 2
  Filled 2020-10-09: qty 1
  Filled 2020-10-09: qty 2

## 2020-10-09 MED ORDER — DEXTROSE 50 % IV SOLN
12.5000 g | INTRAVENOUS | Status: AC
  Administered 2020-10-09: 12.5 g via INTRAVENOUS

## 2020-10-09 MED ORDER — INSULIN ASPART 100 UNIT/ML IJ SOLN
0.0000 [IU] | INTRAMUSCULAR | Status: DC
  Administered 2020-10-09 (×2): 4 [IU] via SUBCUTANEOUS
  Administered 2020-10-10: 7 [IU] via SUBCUTANEOUS
  Administered 2020-10-10: 3 [IU] via SUBCUTANEOUS

## 2020-10-09 MED ORDER — BUPIVACAINE HCL (PF) 0.5 % IJ SOLN
INTRAMUSCULAR | Status: DC | PRN
  Administered 2020-10-09: 30 mL

## 2020-10-09 MED ORDER — ACETAMINOPHEN 325 MG PO TABS: 650.0000 mg | ORAL_TABLET | ORAL | Status: DC | PRN

## 2020-10-09 MED ORDER — LIDOCAINE-EPINEPHRINE 1 %-1:100000 IJ SOLN
INTRAMUSCULAR | Status: AC
  Filled 2020-10-09: qty 1

## 2020-10-09 MED ORDER — PRAVASTATIN SODIUM 40 MG PO TABS
40.0000 mg | ORAL_TABLET | Freq: Every day | ORAL | Status: DC
  Administered 2020-10-09: 40 mg via ORAL
  Filled 2020-10-09: qty 1

## 2020-10-09 MED ORDER — ASPIRIN EC 81 MG PO TBEC
81.0000 mg | DELAYED_RELEASE_TABLET | Freq: Every day | ORAL | Status: DC
  Filled 2020-10-09: qty 1

## 2020-10-09 MED ORDER — CHLORHEXIDINE GLUCONATE 0.12 % MT SOLN: 15.0000 mL | Freq: Once | OROMUCOSAL | Status: AC

## 2020-10-09 MED ORDER — INSULIN GLARGINE 100 UNIT/ML SOLOSTAR PEN: 36.0000 [IU] | PEN_INJECTOR | Freq: Every evening | SUBCUTANEOUS | Status: DC | PRN

## 2020-10-09 MED ORDER — LIDOCAINE HCL (CARDIAC) PF 100 MG/5ML IV SOSY
PREFILLED_SYRINGE | INTRAVENOUS | Status: DC | PRN
  Administered 2020-10-09: 60 mg via INTRAVENOUS

## 2020-10-09 MED ORDER — PROPOFOL 10 MG/ML IV BOLUS
INTRAVENOUS | Status: DC | PRN
  Administered 2020-10-09: 130 mg via INTRAVENOUS

## 2020-10-09 MED ORDER — BUPIVACAINE HCL (PF) 0.5 % IJ SOLN
INTRAMUSCULAR | Status: AC
  Filled 2020-10-09: qty 30

## 2020-10-09 MED ORDER — ROCURONIUM BROMIDE 10 MG/ML (PF) SYRINGE
PREFILLED_SYRINGE | INTRAVENOUS | Status: AC
  Filled 2020-10-09: qty 20

## 2020-10-09 MED ORDER — LACTATED RINGERS IV SOLN: INTRAVENOUS | Status: DC

## 2020-10-09 MED ORDER — CHLORHEXIDINE GLUCONATE CLOTH 2 % EX PADS: 6.0000 | MEDICATED_PAD | Freq: Once | CUTANEOUS | Status: DC

## 2020-10-09 MED ORDER — ORAL CARE MOUTH RINSE: 15.0000 mL | Freq: Once | OROMUCOSAL | Status: AC

## 2020-10-09 MED ORDER — SUCCINYLCHOLINE CHLORIDE 200 MG/10ML IV SOSY
PREFILLED_SYRINGE | INTRAVENOUS | Status: AC
  Filled 2020-10-09: qty 10

## 2020-10-09 MED ORDER — ADULT MULTIVITAMIN W/MINERALS CH
1.0000 | ORAL_TABLET | Freq: Every day | ORAL | Status: DC
  Filled 2020-10-09: qty 1

## 2020-10-09 MED ORDER — EMPAGLIFLOZIN 25 MG PO TABS
25.0000 mg | ORAL_TABLET | Freq: Every day | ORAL | Status: DC
  Filled 2020-10-09: qty 1

## 2020-10-09 MED ORDER — FLUTICASONE PROPIONATE 50 MCG/ACT NA SUSP
2.0000 | Freq: Two times a day (BID) | NASAL | Status: DC | PRN
  Filled 2020-10-09: qty 16

## 2020-10-09 MED ORDER — VITAMIN D 25 MCG (1000 UNIT) PO TABS
1000.0000 [IU] | ORAL_TABLET | Freq: Every day | ORAL | Status: DC
  Filled 2020-10-09: qty 1

## 2020-10-09 MED ORDER — SENNOSIDES-DOCUSATE SODIUM 8.6-50 MG PO TABS: 1.0000 | ORAL_TABLET | Freq: Every evening | ORAL | Status: DC | PRN

## 2020-10-09 MED ORDER — ONDANSETRON HCL 4 MG PO TABS: 4.0000 mg | ORAL_TABLET | Freq: Four times a day (QID) | ORAL | Status: DC | PRN

## 2020-10-09 MED ORDER — ROCURONIUM BROMIDE 10 MG/ML (PF) SYRINGE
PREFILLED_SYRINGE | INTRAVENOUS | Status: DC | PRN
  Administered 2020-10-09: 100 mg via INTRAVENOUS
  Administered 2020-10-09: 20 mg via INTRAVENOUS
  Administered 2020-10-09: 10 mg via INTRAVENOUS

## 2020-10-09 MED ORDER — MIDAZOLAM HCL 2 MG/2ML IJ SOLN
INTRAMUSCULAR | Status: AC
  Filled 2020-10-09: qty 2

## 2020-10-09 MED ORDER — PHENYLEPHRINE HCL-NACL 10-0.9 MG/250ML-% IV SOLN
INTRAVENOUS | Status: DC | PRN
  Administered 2020-10-09: 50 ug/min via INTRAVENOUS

## 2020-10-09 MED ORDER — FENTANYL CITRATE (PF) 100 MCG/2ML IJ SOLN
INTRAMUSCULAR | Status: AC
  Filled 2020-10-09: qty 2

## 2020-10-09 MED ORDER — BACITRACIN ZINC 500 UNIT/GM EX OINT
TOPICAL_OINTMENT | CUTANEOUS | Status: AC
  Filled 2020-10-09: qty 28.35

## 2020-10-09 MED ORDER — ACETAMINOPHEN 160 MG/5ML PO SOLN: 1000.0000 mg | Freq: Once | ORAL | Status: DC | PRN

## 2020-10-09 MED ORDER — LACTATED RINGERS IV SOLN: INTRAVENOUS | Status: DC | PRN

## 2020-10-09 MED ORDER — HEMOSTATIC AGENTS (NO CHARGE) OPTIME
TOPICAL | Status: DC | PRN
  Administered 2020-10-09: 1 via TOPICAL

## 2020-10-09 MED ORDER — PHENYLEPHRINE 40 MCG/ML (10ML) SYRINGE FOR IV PUSH (FOR BLOOD PRESSURE SUPPORT)
PREFILLED_SYRINGE | INTRAVENOUS | Status: AC
  Filled 2020-10-09: qty 10

## 2020-10-09 MED ORDER — TRAMADOL HCL 50 MG PO TABS
50.0000 mg | ORAL_TABLET | Freq: Four times a day (QID) | ORAL | Status: DC | PRN
  Administered 2020-10-09 – 2020-10-10 (×2): 100 mg via ORAL
  Filled 2020-10-09 (×2): qty 2

## 2020-10-09 MED ORDER — SUGAMMADEX SODIUM 200 MG/2ML IV SOLN
INTRAVENOUS | Status: DC | PRN
  Administered 2020-10-09: 200 mg via INTRAVENOUS

## 2020-10-09 MED ORDER — 0.9 % SODIUM CHLORIDE (POUR BTL) OPTIME
TOPICAL | Status: DC | PRN
  Administered 2020-10-09: 1000 mL

## 2020-10-09 MED ORDER — ACETAMINOPHEN 500 MG PO TABS: 1000.0000 mg | ORAL_TABLET | Freq: Once | ORAL | Status: DC | PRN

## 2020-10-09 MED ORDER — ACETAMINOPHEN 10 MG/ML IV SOLN
INTRAVENOUS | Status: AC
  Filled 2020-10-09: qty 100

## 2020-10-09 MED ORDER — DEXAMETHASONE SODIUM PHOSPHATE 10 MG/ML IJ SOLN
INTRAMUSCULAR | Status: AC
  Filled 2020-10-09: qty 2

## 2020-10-09 MED ORDER — ALBUMIN HUMAN 5 % IV SOLN: INTRAVENOUS | Status: DC | PRN

## 2020-10-09 MED ORDER — FENTANYL CITRATE (PF) 100 MCG/2ML IJ SOLN
25.0000 ug | INTRAMUSCULAR | Status: DC | PRN
  Administered 2020-10-09: 50 ug via INTRAVENOUS

## 2020-10-09 MED ORDER — ACETAMINOPHEN 10 MG/ML IV SOLN
1000.0000 mg | Freq: Once | INTRAVENOUS | Status: DC | PRN
  Administered 2020-10-09: 1000 mg via INTRAVENOUS

## 2020-10-09 MED ORDER — ACETAMINOPHEN 500 MG PO TABS
1000.0000 mg | ORAL_TABLET | Freq: Four times a day (QID) | ORAL | Status: DC
  Administered 2020-10-09 – 2020-10-10 (×2): 1000 mg via ORAL
  Filled 2020-10-09 (×2): qty 2

## 2020-10-09 MED ORDER — DOCUSATE SODIUM 100 MG PO CAPS
100.0000 mg | ORAL_CAPSULE | Freq: Two times a day (BID) | ORAL | Status: DC
  Administered 2020-10-09: 100 mg via ORAL
  Filled 2020-10-09 (×2): qty 1

## 2020-10-09 MED ORDER — ONDANSETRON HCL 4 MG/2ML IJ SOLN
INTRAMUSCULAR | Status: AC
  Filled 2020-10-09: qty 4

## 2020-10-09 MED ORDER — PHENOL 1.4 % MT LIQD: 1.0000 | OROMUCOSAL | Status: DC | PRN

## 2020-10-09 MED ORDER — ONDANSETRON HCL 4 MG/2ML IJ SOLN
4.0000 mg | Freq: Four times a day (QID) | INTRAMUSCULAR | Status: DC | PRN
  Administered 2020-10-09: 4 mg via INTRAVENOUS
  Filled 2020-10-09: qty 2

## 2020-10-09 MED ORDER — MAGNESIUM CITRATE PO SOLN: 1.0000 | Freq: Once | ORAL | Status: DC | PRN

## 2020-10-09 MED ORDER — LEVOCETIRIZINE DIHYDROCHLORIDE 5 MG PO TABS: 5.0000 mg | ORAL_TABLET | Freq: Every evening | ORAL | Status: DC

## 2020-10-09 MED ORDER — PREGABALIN 100 MG PO CAPS
200.0000 mg | ORAL_CAPSULE | Freq: Three times a day (TID) | ORAL | Status: DC
  Administered 2020-10-09 – 2020-10-10 (×2): 200 mg via ORAL
  Filled 2020-10-09 (×2): qty 2

## 2020-10-09 MED ORDER — CHLORHEXIDINE GLUCONATE 0.12 % MT SOLN
OROMUCOSAL | Status: AC
  Administered 2020-10-09: 15 mL via OROMUCOSAL
  Filled 2020-10-09: qty 15

## 2020-10-09 MED ORDER — PANTOPRAZOLE SODIUM 40 MG PO TBEC
40.0000 mg | DELAYED_RELEASE_TABLET | Freq: Every day | ORAL | Status: DC
  Filled 2020-10-09: qty 1

## 2020-10-09 MED ORDER — FENTANYL CITRATE (PF) 100 MCG/2ML IJ SOLN
INTRAMUSCULAR | Status: DC | PRN
  Administered 2020-10-09 (×5): 50 ug via INTRAVENOUS
  Administered 2020-10-09: 100 ug via INTRAVENOUS

## 2020-10-09 MED ORDER — SODIUM CHLORIDE 0.9% FLUSH
3.0000 mL | Freq: Two times a day (BID) | INTRAVENOUS | Status: DC
  Administered 2020-10-09: 3 mL via INTRAVENOUS

## 2020-10-09 MED ORDER — PROPOFOL 10 MG/ML IV BOLUS
INTRAVENOUS | Status: AC
  Filled 2020-10-09: qty 20

## 2020-10-09 MED ORDER — MENTHOL 3 MG MT LOZG: 1.0000 | LOZENGE | OROMUCOSAL | Status: DC | PRN

## 2020-10-09 MED ORDER — ONDANSETRON HCL 4 MG/2ML IJ SOLN
INTRAMUSCULAR | Status: DC | PRN
  Administered 2020-10-09: 4 mg via INTRAVENOUS

## 2020-10-09 MED ORDER — DIAZEPAM 5 MG PO TABS
5.0000 mg | ORAL_TABLET | Freq: Four times a day (QID) | ORAL | Status: DC | PRN
  Administered 2020-10-09 – 2020-10-10 (×2): 5 mg via ORAL
  Filled 2020-10-09 (×2): qty 1

## 2020-10-09 MED ORDER — GLIPIZIDE 10 MG PO TABS
10.0000 mg | ORAL_TABLET | Freq: Two times a day (BID) | ORAL | Status: DC
  Filled 2020-10-09: qty 2
  Filled 2020-10-09 (×2): qty 1

## 2020-10-09 MED ORDER — SODIUM CHLORIDE 0.9 % IV SOLN: 250.0000 mL | INTRAVENOUS | Status: DC

## 2020-10-09 MED ORDER — DOXEPIN HCL 10 MG PO CAPS
10.0000 mg | ORAL_CAPSULE | Freq: Every day | ORAL | Status: DC
  Filled 2020-10-09: qty 1

## 2020-10-09 MED ORDER — DICYCLOMINE HCL 20 MG PO TABS
20.0000 mg | ORAL_TABLET | Freq: Every evening | ORAL | Status: DC
  Filled 2020-10-09: qty 1

## 2020-10-09 MED ORDER — BISACODYL 5 MG PO TBEC: 5.0000 mg | DELAYED_RELEASE_TABLET | Freq: Every day | ORAL | Status: DC | PRN

## 2020-10-09 MED ORDER — ONDANSETRON 4 MG PO TBDP: 4.0000 mg | ORAL_TABLET | Freq: Three times a day (TID) | ORAL | Status: DC | PRN

## 2020-10-09 MED ORDER — ZOLPIDEM TARTRATE 5 MG PO TABS: 5.0000 mg | ORAL_TABLET | Freq: Every evening | ORAL | Status: DC | PRN

## 2020-10-09 SURGICAL SUPPLY — 62 items
ADH SKN CLS APL DERMABOND .7 (GAUZE/BANDAGES/DRESSINGS) ×1
APL SRG 60D 8 XTD TIP BNDBL (TIP) ×1
BUR MATCHSTICK NEURO 3.0 LAGG (BURR) ×2 IMPLANT
BUR PRECISION FLUTE 5.0 (BURR) ×2 IMPLANT
CANISTER SUCT 3000ML PPV (MISCELLANEOUS) ×2 IMPLANT
CARTRIDGE OIL MAESTRO DRILL (MISCELLANEOUS) ×1 IMPLANT
CNTNR URN SCR LID CUP LEK RST (MISCELLANEOUS) ×1 IMPLANT
CONT SPEC 4OZ STRL OR WHT (MISCELLANEOUS) ×2
COVER BACK TABLE 60X90IN (DRAPES) ×2 IMPLANT
COVER WAND RF STERILE (DRAPES) ×2 IMPLANT
DERMABOND ADVANCED (GAUZE/BANDAGES/DRESSINGS) ×1
DERMABOND ADVANCED .7 DNX12 (GAUZE/BANDAGES/DRESSINGS) ×1 IMPLANT
DIFFUSER DRILL AIR PNEUMATIC (MISCELLANEOUS) ×2 IMPLANT
DRAPE C-ARM 42X72 X-RAY (DRAPES) ×4 IMPLANT
DRAPE LAPAROTOMY 100X72X124 (DRAPES) ×2 IMPLANT
DRAPE SURG 17X23 STRL (DRAPES) ×2 IMPLANT
DRSG OPSITE POSTOP 4X6 (GAUZE/BANDAGES/DRESSINGS) ×2 IMPLANT
DURAPREP 26ML APPLICATOR (WOUND CARE) ×2 IMPLANT
DURASEAL APPLICATOR TIP (TIP) ×2 IMPLANT
DURASEAL SPINE SEALANT 3ML (MISCELLANEOUS) ×2 IMPLANT
ELECT REM PT RETURN 9FT ADLT (ELECTROSURGICAL) ×2
ELECTRODE REM PT RTRN 9FT ADLT (ELECTROSURGICAL) ×1 IMPLANT
GAUZE 4X4 16PLY RFD (DISPOSABLE) IMPLANT
GAUZE SPONGE 4X4 12PLY STRL (GAUZE/BANDAGES/DRESSINGS) IMPLANT
GLOVE SURG LTX SZ6.5 (GLOVE) ×6 IMPLANT
GOWN STRL REUS W/ TWL LRG LVL3 (GOWN DISPOSABLE) ×3 IMPLANT
GOWN STRL REUS W/ TWL XL LVL3 (GOWN DISPOSABLE) IMPLANT
GOWN STRL REUS W/TWL 2XL LVL3 (GOWN DISPOSABLE) IMPLANT
GOWN STRL REUS W/TWL LRG LVL3 (GOWN DISPOSABLE) ×6
GOWN STRL REUS W/TWL XL LVL3 (GOWN DISPOSABLE)
GRAFT BN 5X1XSPNE CVD POST DBM (Bone Implant) ×1 IMPLANT
GRAFT BONE MAGNIFUSE 1X5CM (Bone Implant) ×2 IMPLANT
KIT BASIN OR (CUSTOM PROCEDURE TRAY) ×2 IMPLANT
KIT POSITION SURG JACKSON T1 (MISCELLANEOUS) ×2 IMPLANT
KIT TURNOVER KIT B (KITS) ×2 IMPLANT
MILL MEDIUM DISP (BLADE) IMPLANT
NEEDLE HYPO 25X1 1.5 SAFETY (NEEDLE) ×2 IMPLANT
NEEDLE SPNL 18GX3.5 QUINCKE PK (NEEDLE) IMPLANT
NS IRRIG 1000ML POUR BTL (IV SOLUTION) ×2 IMPLANT
OIL CARTRIDGE MAESTRO DRILL (MISCELLANEOUS) ×2
PACK LAMINECTOMY NEURO (CUSTOM PROCEDURE TRAY) ×2 IMPLANT
ROD PREBENT 45MM LUMBAR (Rod) ×2 IMPLANT
ROD RELINE O-H CON M 5.0/6.0MM (Rod) ×4 IMPLANT
ROD RELINE TI LATERAL MED OFF (Rod) ×2 IMPLANT
SCREW LOCK (Screw) ×4 IMPLANT
SCREW LOCK FXNS SPNE MAS PL (Screw) ×2 IMPLANT
SCREW LOCK RELINE 5.5 TULIP (Screw) ×4 IMPLANT
SCREW SHANK 6.5X65 (Screw) ×6 IMPLANT
SCREW TULIP 5.5 (Screw) ×4 IMPLANT
SPONGE LAP 4X18 RFD (DISPOSABLE) IMPLANT
SPONGE SURGIFOAM ABS GEL 100 (HEMOSTASIS) ×2 IMPLANT
STRIP CLOSURE SKIN 1/2X4 (GAUZE/BANDAGES/DRESSINGS) IMPLANT
SUT ETHILON 3 0 FSL (SUTURE) ×4 IMPLANT
SUT PROLENE 6 0 BV (SUTURE) ×4 IMPLANT
SUT VIC AB 0 CT1 18XCR BRD8 (SUTURE) ×3 IMPLANT
SUT VIC AB 0 CT1 8-18 (SUTURE) ×6
SUT VIC AB 2-0 CT1 18 (SUTURE) ×2 IMPLANT
SUT VIC AB 3-0 SH 8-18 (SUTURE) ×4 IMPLANT
TOWEL GREEN STERILE (TOWEL DISPOSABLE) ×2 IMPLANT
TOWEL GREEN STERILE FF (TOWEL DISPOSABLE) ×2 IMPLANT
TRAY FOLEY MTR SLVR 16FR STAT (SET/KITS/TRAYS/PACK) ×2 IMPLANT
WATER STERILE IRR 1000ML POUR (IV SOLUTION) ×2 IMPLANT

## 2020-10-09 NOTE — Progress Notes (Signed)
Hypoglycemic Event  CBG: 72  Treatment: D50 25 mL (12.5 gm), verbal order from Dr. Ermalene Postin  Symptoms: None  Follow-up CBG: Time:13:38 CBG Result:123  Possible Reasons for Event: Inadequate meal intake  Comments/MD notified:Dr. Demaris Callander, RN, BSN

## 2020-10-09 NOTE — Anesthesia Procedure Notes (Signed)
Procedure Name: Intubation Date/Time: 10/09/2020 2:05 PM Performed by: Eligha Bridegroom, CRNA Pre-anesthesia Checklist: Patient identified, Emergency Drugs available, Suction available, Patient being monitored and Timeout performed Patient Re-evaluated:Patient Re-evaluated prior to induction Oxygen Delivery Method: Circle system utilized Preoxygenation: Pre-oxygenation with 100% oxygen Induction Type: IV induction Laryngoscope Size: Mac and 3 Grade View: Grade I Tube type: Oral Tube size: 7.0 mm Number of attempts: 1 Placement Confirmation: ETT inserted through vocal cords under direct vision Secured at: 21 cm Tube secured with: Tape Dental Injury: Teeth and Oropharynx as per pre-operative assessment

## 2020-10-09 NOTE — Progress Notes (Signed)
Per pt, she ate one ginger hard candy @ 1005 b/c her blood sugar was 71. Notified Dr. Ermalene Postin. No new interventions at this time.

## 2020-10-09 NOTE — Transfer of Care (Signed)
Immediate Anesthesia Transfer of Care Note  Patient: Amanda Morris  Procedure(s) Performed: LUMBAR FIVE-SACRAL ONE DECOMPRESSION WITH PLACEMENT OF SCREWS AND RODS EXTENDING EXISTING LUMBAR FUSION (Spine Lumbar)  Patient Location: PACU  Anesthesia Type:General  Level of Consciousness: drowsy  Airway & Oxygen Therapy: Patient Spontanous Breathing and Patient connected to nasal cannula oxygen  Post-op Assessment: Report given to RN and Post -op Vital signs reviewed and stable  Post vital signs: Reviewed and stable  Last Vitals:  Vitals Value Taken Time  BP 144/78   Temp    Pulse 102   Resp 18   SpO2 97%     Last Pain:  Vitals:   10/09/20 1126  TempSrc:   PainSc: 6       Patients Stated Pain Goal: 3 (49/32/41 9914)  Complications: No notable events documented.

## 2020-10-09 NOTE — Anesthesia Preprocedure Evaluation (Addendum)
Anesthesia Evaluation  Patient identified by MRN, date of birth, ID band Patient awake    Reviewed: Allergy & Precautions, NPO status , Patient's Chart, lab work & pertinent test results  History of Anesthesia Complications Negative for: history of anesthetic complications  Airway Mallampati: II  TM Distance: >3 FB Neck ROM: Full    Dental  (+) Dental Advisory Given   Pulmonary neg shortness of breath, neg sleep apnea, neg COPD, neg recent URI, former smoker,  Covid-19 Nucleic Acid Test Results Lab Results      Component                Value               Date                      [place]              NEGATIVE            10/07/2020                [place]              NEGATIVE            05/22/2020                [place]              NEGATIVE            04/14/2020              breath sounds clear to auscultation       Cardiovascular hypertension,  Rhythm:Regular     Neuro/Psych  Headaches,  Neuromuscular disease negative psych ROS   GI/Hepatic Neg liver ROS, hiatal hernia, GERD  ,  Endo/Other  diabetes, Insulin DependentLab Results      Component                Value               Date                      HGBA1C                   6.6 (A)             09/01/2020             Renal/GU negative Renal ROSLab Results      Component                Value               Date                      CREATININE               0.81                10/07/2020                Musculoskeletal   Abdominal   Peds  Hematology negative hematology ROS (+) Lab Results      Component                Value               Date  WBC                      9.6                 10/07/2020                HGB                      12.8                10/07/2020                HCT                      42.5                10/07/2020                MCV                      75.4 (L)            10/07/2020                PLT                       266                 10/07/2020              Anesthesia Other Findings   Reproductive/Obstetrics                            Anesthesia Physical Anesthesia Plan  ASA: 2  Anesthesia Plan: General   Post-op Pain Management:    Induction: Intravenous  PONV Risk Score and Plan: 3 and Ondansetron and Dexamethasone  Airway Management Planned: Oral ETT  Additional Equipment: None  Intra-op Plan:   Post-operative Plan: Extubation in OR  Informed Consent: I have reviewed the patients History and Physical, chart, labs and discussed the procedure including the risks, benefits and alternatives for the proposed anesthesia with the patient or authorized representative who has indicated his/her understanding and acceptance.     Dental advisory given  Plan Discussed with: Surgeon and CRNA  Anesthesia Plan Comments:         Anesthesia Quick Evaluation

## 2020-10-09 NOTE — Anesthesia Postprocedure Evaluation (Signed)
Anesthesia Post Note  Patient: Amanda Morris  Procedure(s) Performed: LUMBAR FIVE-SACRAL ONE DECOMPRESSION WITH PLACEMENT OF SCREWS AND RODS EXTENDING EXISTING LUMBAR FUSION (Spine Lumbar)     Patient location during evaluation: PACU Anesthesia Type: General Level of consciousness: awake and alert Pain management: pain level controlled Vital Signs Assessment: post-procedure vital signs reviewed and stable Respiratory status: spontaneous breathing, nonlabored ventilation, respiratory function stable and patient connected to nasal cannula oxygen Cardiovascular status: blood pressure returned to baseline and stable Postop Assessment: no apparent nausea or vomiting Anesthetic complications: no   No notable events documented.  Last Vitals:  Vitals:   10/09/20 2120 10/09/20 2154  BP: 133/79 (!) 145/81  Pulse: 86 87  Resp: 17 16  Temp: 36.8 C 36.7 C  SpO2: 100% 100%    Last Pain:  Vitals:   10/09/20 2323  TempSrc:   PainSc: [place]

## 2020-10-09 NOTE — H&P (Signed)
BP (!) 131/99   Pulse (!) 107   Temp 98.5 F (36.9 C) (Oral)   Resp 18   Ht 5\' 4"  (1.626 m)   Wt 99.8 kg   LMP  (LMP Unknown)   SpO2 95%   BMI 37.76 kg/m  Amanda Morris presents today so we could go over the myelogram post myelogram of the lumbar spine.  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  This is best seen on the myelogram and actually not the CT, but there is a vast difference in the amount of space within the spinal canal at that level.  Where she has had an operation looks fine, probably has not fused completely into interbody at 4-5, is very close to being fused at 3-4.  The screws are all in good position. Allergies  Allergen Reactions   Penicillins Hives and Other (See Comments)    PATIENT HAS HAD A PCN REACTION WITH IMMEDIATE RASH, FACIAL/TONGUE/THROAT SWELLING, SOB, OR LIGHTHEADEDNESS WITH HYPOTENSION:  #  #  YES  #  #  Has patient had a PCN reaction causing severe rash involving mucus membranes or skin necrosis: No Has patient had a PCN reaction that required hospitalization: No Has patient had a PCN reaction occurring within the last 10 years: No If all of the above answers are "NO", then may proceed with Cephalosporin use.    Duloxetine Other (See Comments)    GI upset   Aspirin Nausea Only   Codeine Itching   Hydrocodone Nausea Only    Nose bleed   Metformin And Related Diarrhea and Nausea Only   Oxycodone Nausea And Vomiting    GI Upset (intolerance)   Victoza [Liraglutide] Other (See Comments)    Abdominal pain   Past Medical History:  Diagnosis Date   Allergy    Diabetes mellitus without complication (HCC)    type 2   GERD (gastroesophageal reflux disease)    Headache    History of hiatal hernia    Hypercholesterolemia    Numbness    left, outer thigh   Palpitations    in the past, no current issues per patient   Postlaminectomy syndrome    Past Surgical History:  Procedure Laterality Date    ABDOMINAL HYSTERECTOMY  1982   BACK SURGERY     x 2    BREAST REDUCTION SURGERY Bilateral 03/17/2016   Procedure: MAMMARY REDUCTION  (BREAST);  Surgeon: Wallace Going, DO;  Location: [facility];  Service: Plastics;  Laterality: Bilateral;   BREAST SURGERY     COLONOSCOPY     LUMBAR WOUND DEBRIDEMENT N/A 05/22/2020   Procedure: Incision and drainage of lumbar wound;  Surgeon: Ashok Pall, MD;  Location: [facility];  Service: Neurosurgery;  Laterality: N/A;   SHOULDER SURGERY  06/2008, 09/2010   Right, by Dr. Karie Soda, then Dr. Judeth Horn   SPINAL CORD STIMULATOR INSERTION N/A 12/02/2017   Procedure: LUMBAR SPINAL CORD STIMULATOR INSERTION;  Surgeon: Ashok Pall, MD;  Location: [facility];  Service: Neurosurgery;  Laterality: N/A;   SPINAL CORD STIMULATOR TRIAL N/A 11/25/2017   Procedure: INSERTION LUMBAR SPINAL CORD STIMULATOR TRIAL  ;  Surgeon: Ashok Pall, MD;  Location: [facility];  Service: Neurosurgery;  Laterality: N/A;   INSERTION LUMBAR SPINAL CORD STIMULATOR TRIAL     UPPER GI ENDOSCOPY     Family History  Problem Relation Age of Onset   Heart disease Mother    Diabetes Mother  Hypertension Mother    Stroke Mother    Heart disease Father    Heart disease Sister    Diabetes Sister    Hyperlipidemia Sister    Hypertension Sister    Stroke Sister    Alcohol abuse Brother    Hyperlipidemia Brother    Social History   Socioeconomic History   Marital status: Divorced    Spouse name: Not on file   Number of children: 1   Years of education: 9th grade   Highest education level: 9th grade  Occupational History   Occupation: Disabled.     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   Right-handed.   No daily caffeine use.   Social Determinants of Health   Financial Resource Strain: Not on file  Food Insecurity: Not on file  Transportation Needs: Not on file  Physical Activity: Not on file  Stress: Not on file  Social Connections: Not on file  Intimate Partner Violence: Not on file       I discussed with Amanda Morris that she says she hurts and I think what needs to occur, is she needs to get decompressed at 5-1 and probably needs have rods placed.  She fish mouths there a great deal, but the decompression is what is necessary.  I, again, believe that she will need screws in S1 and connect it with the previous hardware present.  She is alert, oriented by 4.  Answering all questions appropriately.  Memory, language, attention span, fund of knowledge are normal.  5/5 strength in lower extremities Wounds are well healed Intact proprioception lower and upper extremities. Normal muscle tone and bulk.

## 2020-10-10 LAB — GLUCOSE, CAPILLARY
Glucose-Capillary: 208 mg/dL — ABNORMAL HIGH (ref 70–99)
Glucose-Capillary: 146 mg/dL — ABNORMAL HIGH (ref 70–99)

## 2020-10-10 MED ORDER — TRAMADOL HCL 50 MG PO TABS: 50.0000 mg | ORAL_TABLET | Freq: Four times a day (QID) | ORAL | 0 refills | Status: DC | PRN

## 2020-10-10 MED ORDER — LANTUS SOLOSTAR 100 UNIT/ML ~~LOC~~ SOPN: 36.0000 [IU] | PEN_INJECTOR | Freq: Every evening | SUBCUTANEOUS | Status: DC | PRN

## 2020-10-10 MED ORDER — TIZANIDINE HCL 4 MG PO TABS: 4.0000 mg | ORAL_TABLET | Freq: Four times a day (QID) | ORAL | 0 refills | Status: DC | PRN

## 2020-10-10 NOTE — Evaluation (Addendum)
Occupational Therapy Evaluation/Discharge Patient Details Name: Amanda Morris MRN: [id number] DOB: 02-Jul-1950 Today's Date: 10/10/2020    History of Present Illness 70 y.o. female s/p L5-S1 decompression with placement of screws and rods extending existing lumbar fusion. PMH significant for allergies, DM type II, hiatal hernia, hypercholesterolemia, numbness, palpitations, postlaminectomy syndrome, back surgery x2, and spinal cord stimulator insertion.   Clinical Impression   PTA, pt was independent and lived alone. Currently, pt requiring supervision and min cues to maintain precautions during LB ADLs. Pt requiring supervision for shower transfer due to decreased balance. Pt educated and demonstrated compensatory techniques for LB dressing, oral care, toilet transfer, toilet hygiene, and shower transfer. All questions answered. Recommend d/c home with no follow up OT at this time. Re-consult if change in status. OT to sign off.     Follow Up Recommendations  No OT follow up    Equipment Recommendations  None recommended by OT    Recommendations for Other Services       Precautions / Restrictions Precautions Precautions: Back Precaution Booklet Issued: Yes (comment) Precaution Comments: All precautionsreviewed and education provided. Required Braces or Orthoses:  (no brace needed per doctor orders) Restrictions Weight Bearing Restrictions: No Other Position/Activity Restrictions: do not lift more than 10 lbs      Mobility Bed Mobility Overal bed mobility: Modified Independent             General bed mobility comments: Pt performing log roll with mod I for increased time.    Transfers Overall transfer level: Modified independent Equipment used: None             General transfer comment: Pt completing sit<> stand transfers with mod I    Balance Overall balance assessment: Needs assistance Sitting-balance support: Feet supported;No upper extremity  supported Sitting balance-Leahy Scale: Good Sitting balance - Comments: Pt able to don LB dressing seated   Standing balance support: No upper extremity supported;During functional activity Standing balance-Leahy Scale: Fair Standing balance comment: Pt requiring supervision for safetyt to complete tub/shower transfer                           ADL either performed or assessed with clinical judgement   ADL Overall ADL's : Needs assistance/impaired Eating/Feeding: Modified independent;Sitting   Grooming: Standing;Modified independent Grooming Details (indicate cue type and reason): Pt educated on compensatory techniques for oral care within precautions Upper Body Bathing: Standing;Modified independent   Lower Body Bathing: Supervison/ safety;Cueing for back precautions;Sit to/from stand   Upper Body Dressing : Modified independent;Sitting   Lower Body Dressing: Supervision/safety;Cueing for back precautions;Sit to/from stand Lower Body Dressing Details (indicate cue type and reason): Pt educated on LB dressing within precautions Toilet Transfer: Modified Independent;Ambulation;Regular Toilet   Toileting- Clothing Manipulation and Hygiene: Cueing for back precautions;Sit to/from stand;Modified independent   Tub/ Banker: Supervision/safety;Ambulation;Shower seat   Functional mobility during ADLs: Modified independent General ADL Comments: Pt requiring supervision for LB ADLs and min verbal cues to maintain back precautions. Pt educated and demonstrated LB dressing, shower transfer, toilet transfer, toilet hygiene within precautions.     Vision   Vision Assessment?: No apparent visual deficits     Perception Perception Perception Tested?: No Comments: no apparent duifficulty with perception   Praxis Praxis Praxis tested?: Not tested Praxis-Other Comments: no apparent motor planning difficulties.    Pertinent Vitals/Pain Pain Assessment: Faces Faces Pain  Scale: Hurts a little bit Pain Location: back Pain Descriptors / Indicators:  Discomfort Pain Intervention(s): Monitored during session;Limited activity within patient's tolerance     Hand Dominance Right   Extremity/Trunk Assessment Upper Extremity Assessment Upper Extremity Assessment: Overall WFL for tasks assessed   Lower Extremity Assessment Lower Extremity Assessment: Defer to PT evaluation   Cervical / Trunk Assessment Cervical / Trunk Assessment: Other exceptions Cervical / Trunk Exceptions: s/p Lumbar surgery   Communication Communication Communication: No difficulties   Cognition Arousal/Alertness: Awake/alert Behavior During Therapy: WFL for tasks assessed/performed Overall Cognitive Status: Within Functional Limits for tasks assessed                                 General Comments: pt pleasant and conversational throughout   General Comments  Pt requiring min cues throughout session to maintain back precautions, although able to recall back precautions.    Exercises     Shoulder Instructions      Home Living Family/patient expects to be discharged to:: Private residence Living Arrangements: Alone Available Help at Discharge: Family;Available PRN/intermittently Type of Home: Apartment Home Access: Level entry     Home Layout: One level     Bathroom Shower/Tub: Teacher, early years/pre: Standard     Home Equipment: Shower seat - built in;Adaptive equipment;Grab bars - Production designer, theatre/television/film: [employer]        Prior Functioning/Environment Level of Independence: Independent                 OT Problem List: Decreased strength;Decreased activity tolerance;Impaired balance (sitting and/or standing);Decreased knowledge of precautions;Pain      OT Treatment/Interventions:      OT Goals(Current goals can be found in the care plan section) Acute Rehab OT Goals Patient Stated Goal: no pain OT Goal Formulation: With  patient  OT Frequency:     Barriers to D/C:            Co-evaluation              AM-PAC OT "6 Clicks" Daily Activity     Outcome Measure Help from another person eating meals?: None Help from another person taking care of personal grooming?: None Help from another person toileting, which includes using toliet, bedpan, or urinal?: None Help from another person bathing (including washing, rinsing, drying)?: A Little Help from another person to put on and taking off regular upper body clothing?: None Help from another person to put on and taking off regular lower body clothing?: A Little 6 Click Score: 22   End of Session Equipment Utilized During Treatment: Gait belt Nurse Communication: Mobility status  Activity Tolerance: Patient tolerated treatment well Patient left: in bed;with call bell/phone within reach  OT Visit Diagnosis: Unsteadiness on feet (R26.81);Muscle weakness (generalized) (M62.81);Pain Pain - part of body:  (back)                Time: 6834-1962 OT Time Calculation (min): 16 min Charges:  OT General Charges $OT Visit: 1 Visit OT Evaluation $OT Eval Low Complexity: 1 Low  Shanda Howells, OTDS   Shanda Howells 10/10/2020, 8:22 AM

## 2020-10-10 NOTE — Evaluation (Signed)
Physical Therapy Evaluation & Discharge Patient Details Name: Amanda Morris MRN: [id number] DOB: 20-Dec-1950 Today's Date: 10/10/2020   History of Present Illness  Pt is a 70 y.o. female admitted and s/p L5-S1 decompression with placement of screws and rods extending existing lumbar fusion on 10/09/20. PMH includes back sx, lumbar wound I&D (04/2020), spinal cord stimulator (2019), shoulder sx, DM.   Clinical Impression  Patient evaluated by Physical Therapy with no further acute PT needs identified. PTA, pt independent, drives, lives alone with multiple supportive family members nearby. Today, pt mod indep with mobility using RW. Educ re: back precautions, positioning, activity recommendations, importance of mobility. All education has been completed and the patient has no further questions. Acute PT is signing off. Thank you for this referral.     Follow Up Recommendations No PT follow up;Supervision - Intermittent    Equipment Recommendations  None recommended by PT    Recommendations for Other Services       Precautions / Restrictions Precautions Precautions: Back Precaution Comments: All precautionsreviewed and education provided. Required Braces or Orthoses:  (no brace needed per doctor orders) Restrictions     Mobility  Bed Mobility Overal bed mobility: Modified Independent             General bed mobility comments: Return to supine with log roll, good technique without cues; pt maintaining sidelying position, pillow placed behind back and blanket between knees to prevent twisting of spine    Transfers Overall transfer level: Modified independent Equipment used: Rolling walker (2 wheeled);None            Ambulation/Gait Ambulation/Gait assistance: Modified independent (Device/Increase time) Gait Distance (Feet): 228 Feet Assistive device: Rolling walker (2 wheeled) Gait Pattern/deviations: Step-through pattern;Decreased stride length Gait velocity:  Decreased   General Gait Details: Pt reports planning to use RW upon return home; pt mod indep ambulating with RW, reports limited by pain  Stairs Stairs:  (pt declined stair training; reports no stairs at home "I didn't do stairs even before I had this sx")          Wheelchair Mobility    Modified Rankin (Stroke Patients Only)       Balance Overall balance assessment: Needs assistance Sitting-balance support: Feet supported;No upper extremity supported Sitting balance-Leahy Scale: Good   Standing balance support: No upper extremity supported;During functional activity Standing balance-Leahy Scale: Fair Standing balance comment: can static stand and take steps without UE support; static/dynamic stability improved with UE support                             Pertinent Vitals/Pain Pain Assessment: Faces Faces Pain Scale: Hurts little more Pain Location: Lumbar incision Pain Descriptors / Indicators: Discomfort Pain Intervention(s): Monitored during session;Limited activity within patient's tolerance    Home Living Family/patient expects to be discharged to:: Private residence Living Arrangements: Alone Available Help at Discharge: Family;Available PRN/intermittently Type of Home: Apartment Home Access: Level entry     Home Layout: One level Home Equipment: Shower seat - built in;Adaptive equipment;Grab bars - tub/shower;Walker - 2 wheels Additional Comments: "All of my nieces are just a phone call away... they will be there as much as I need to help"    Prior Function Level of Independence: Independent         Comments: Retired from working at [employer]. Now enjoys spending time with family; drives     Hand Dominance   Dominant Hand: Right  Extremity/Trunk Assessment   Upper Extremity Assessment Upper Extremity Assessment: Overall WFL for tasks assessed    Lower Extremity Assessment Lower Extremity Assessment: Overall WFL for tasks  assessed    Cervical / Trunk Assessment Cervical / Trunk Assessment: Other exceptions Cervical / Trunk Exceptions: s/p Lumbar surgery  Communication   Communication: No difficulties  Cognition Arousal/Alertness: Awake/alert Behavior During Therapy: WFL for tasks assessed/performed Overall Cognitive Status: Within Functional Limits for tasks assessed                                 General Comments: pt pleasant and conversational throughout      General Comments General comments (skin integrity, edema, etc.): Pt requiring min cues throughout session to maintain back precautions, although able to recall back precautions.    Exercises     Assessment/Plan    PT Assessment Patent does not need any further PT services  PT Problem List         PT Treatment Interventions      PT Goals (Current goals can be found in the Care Plan section)  Acute Rehab PT Goals Patient Stated Goal: Home today PT Goal Formulation: All assessment and education complete, DC therapy    Frequency     Barriers to discharge        Co-evaluation               AM-PAC PT "6 Clicks" Mobility  Outcome Measure Help needed turning from your back to your side while in a flat bed without using bedrails?: None Help needed moving from lying on your back to sitting on the side of a flat bed without using bedrails?: None Help needed moving to and from a bed to a chair (including a wheelchair)?: None Help needed standing up from a chair using your arms (e.g., wheelchair or bedside chair)?: None Help needed to walk in hospital room?: None Help needed climbing 3-5 steps with a railing? : A Little 6 Click Score: 23    End of Session   Activity Tolerance: Patient tolerated treatment well Patient left: in bed;with call bell/phone within reach;with bed alarm set Nurse Communication: Mobility status PT Visit Diagnosis: Other abnormalities of gait and mobility (R26.89);Pain    Time:  9672-8979 PT Time Calculation (min) (ACUTE ONLY): 14 min   Charges:   PT Evaluation $PT Eval Low Complexity: [place], PT, DPT Acute Rehabilitation Services  Pager [phone] Office [place] 10/10/2020, 8:41 AM

## 2020-10-10 NOTE — Discharge Instructions (Addendum)
Wound Care Leave incision open to air. You may shower. Do not scrub directly on incision.  Do not put any creams, lotions, or ointments on incision. Activity Walk each and every day, increasing distance each day. No lifting greater than 5 lbs.  Avoid bending, arching, and twisting. No driving for 2 weeks; may ride as a passenger locally. Diet Resume your normal diet.  Return to Work Will be discussed at you follow up appointment. Call Your Doctor If Any of These Occur Redness, drainage, or swelling at the wound.  Temperature greater than 101 degrees. Severe pain not relieved by pain medication. Incision starts to come apart. Follow Up Appt Call today for appointment in 2-4 weeks ([phone]) or for problems.  If you have any hardware placed in your spine, you will need an x-ray before your appointment.  Spinal Fusion Care After Refer to this sheet in the next few weeks. These instructions provide you with information on caring for yourself after your procedure. Your caregiver may also give you more specific instructions. Your treatment has been planned according to current medical practices, but problems sometimes occur. Call your caregiver if you have any problems or questions after your procedure. HOME CARE INSTRUCTIONS  Take whatever pain medicine has been prescribed by your caregiver. Do not take over-the-counter pain medicine unless directed otherwise by your caregiver.  Do not drive if you are taking narcotic pain medicines.  Change your bandage (dressing) if necessary or as directed by your caregiver.  You may shower. The wound may get wet, simply pat the area dry. It will take ~2 weeks for the glue to peel off. If you have been prescribed medicine to prevent your blood from clotting, follow the directions carefully.  Check the area around your incision often. Look for redness and swelling. Also, look for anything leaking from your wound. You can use a mirror or have a family member  inspect your incision if it is in a place where it is difficult for you to see.  Ask your caregiver what activities you should avoid and for how long.  Walk as much as possible.  Do not lift anything heavier than 5 lbs until your caregiver says it is safe.  Do not twist or bend for a few weeks. Try not to pull on things. Avoid sitting for long periods of time. Change positions at least every hour.

## 2020-10-10 NOTE — Progress Notes (Signed)
Patient is discharged from room 3C08 at this time. Alert and in stable condition. IV site d/c'd and instructions read to patient with understanding verbalized and all questions answered. Left unit via wheelchair with all belongings at side. 

## 2020-10-11 ENCOUNTER — Emergency Department (HOSPITAL_COMMUNITY): Payer: Medicare Other

## 2020-10-11 ENCOUNTER — Encounter (HOSPITAL_COMMUNITY): Payer: Self-pay

## 2020-10-11 ENCOUNTER — Inpatient Hospital Stay (HOSPITAL_COMMUNITY)
Admission: EM | Admit: 2020-10-11 | Discharge: 2020-10-18 | DRG: 948 | Disposition: A | Discharge status: Skilled Nursing Facility | Payer: Medicare Other | Attending: Family Medicine | Admitting: Family Medicine

## 2020-10-11 DIAGNOSIS — Z9889 Other specified postprocedural states: Secondary | ICD-10-CM

## 2020-10-11 DIAGNOSIS — Z83438 Family history of other disorder of lipoprotein metabolism and other lipidemia: Secondary | ICD-10-CM

## 2020-10-11 DIAGNOSIS — M5441 Lumbago with sciatica, right side: Secondary | ICD-10-CM | POA: Diagnosis not present

## 2020-10-11 DIAGNOSIS — R748 Abnormal levels of other serum enzymes: Secondary | ICD-10-CM

## 2020-10-11 DIAGNOSIS — E1169 Type 2 diabetes mellitus with other specified complication: Secondary | ICD-10-CM

## 2020-10-11 DIAGNOSIS — E785 Hyperlipidemia, unspecified: Secondary | ICD-10-CM | POA: Diagnosis present

## 2020-10-11 DIAGNOSIS — M545 Low back pain, unspecified: Secondary | ICD-10-CM | POA: Diagnosis not present

## 2020-10-11 DIAGNOSIS — W08XXXA Fall from other furniture, initial encounter: Secondary | ICD-10-CM | POA: Diagnosis present

## 2020-10-11 DIAGNOSIS — K219 Gastro-esophageal reflux disease without esophagitis: Secondary | ICD-10-CM | POA: Diagnosis not present

## 2020-10-11 DIAGNOSIS — E7800 Pure hypercholesterolemia, unspecified: Secondary | ICD-10-CM | POA: Diagnosis present

## 2020-10-11 DIAGNOSIS — M4800 Spinal stenosis, site unspecified: Secondary | ICD-10-CM | POA: Diagnosis not present

## 2020-10-11 DIAGNOSIS — Z79899 Other long term (current) drug therapy: Secondary | ICD-10-CM

## 2020-10-11 DIAGNOSIS — W19XXXA Unspecified fall, initial encounter: Secondary | ICD-10-CM | POA: Diagnosis not present

## 2020-10-11 DIAGNOSIS — M5440 Lumbago with sciatica, unspecified side: Secondary | ICD-10-CM | POA: Diagnosis not present

## 2020-10-11 DIAGNOSIS — E860 Dehydration: Secondary | ICD-10-CM

## 2020-10-11 DIAGNOSIS — Z20822 Contact with and (suspected) exposure to covid-19: Secondary | ICD-10-CM | POA: Diagnosis present

## 2020-10-11 DIAGNOSIS — M48062 Spinal stenosis, lumbar region with neurogenic claudication: Secondary | ICD-10-CM | POA: Diagnosis not present

## 2020-10-11 DIAGNOSIS — G8918 Other acute postprocedural pain: Secondary | ICD-10-CM | POA: Diagnosis not present

## 2020-10-11 DIAGNOSIS — R6883 Chills (without fever): Secondary | ICD-10-CM | POA: Diagnosis present

## 2020-10-11 DIAGNOSIS — E861 Hypovolemia: Secondary | ICD-10-CM | POA: Diagnosis present

## 2020-10-11 DIAGNOSIS — M25559 Pain in unspecified hip: Secondary | ICD-10-CM

## 2020-10-11 DIAGNOSIS — E669 Obesity, unspecified: Secondary | ICD-10-CM | POA: Diagnosis present

## 2020-10-11 DIAGNOSIS — Z833 Family history of diabetes mellitus: Secondary | ICD-10-CM | POA: Diagnosis not present

## 2020-10-11 DIAGNOSIS — Z981 Arthrodesis status: Secondary | ICD-10-CM

## 2020-10-11 DIAGNOSIS — Z885 Allergy status to narcotic agent status: Secondary | ICD-10-CM

## 2020-10-11 DIAGNOSIS — Z7982 Long term (current) use of aspirin: Secondary | ICD-10-CM

## 2020-10-11 DIAGNOSIS — I10 Essential (primary) hypertension: Secondary | ICD-10-CM | POA: Diagnosis not present

## 2020-10-11 DIAGNOSIS — Z90710 Acquired absence of both cervix and uterus: Secondary | ICD-10-CM

## 2020-10-11 DIAGNOSIS — Z888 Allergy status to other drugs, medicaments and biological substances status: Secondary | ICD-10-CM

## 2020-10-11 DIAGNOSIS — Z886 Allergy status to analgesic agent status: Secondary | ICD-10-CM

## 2020-10-11 DIAGNOSIS — Z880 Allergy status to penicillin: Secondary | ICD-10-CM

## 2020-10-11 DIAGNOSIS — M4807 Spinal stenosis, lumbosacral region: Secondary | ICD-10-CM

## 2020-10-11 DIAGNOSIS — Z9181 History of falling: Secondary | ICD-10-CM | POA: Diagnosis not present

## 2020-10-11 DIAGNOSIS — R509 Fever, unspecified: Secondary | ICD-10-CM

## 2020-10-11 DIAGNOSIS — M5459 Other low back pain: Secondary | ICD-10-CM | POA: Diagnosis not present

## 2020-10-11 DIAGNOSIS — G8929 Other chronic pain: Secondary | ICD-10-CM | POA: Diagnosis present

## 2020-10-11 DIAGNOSIS — M160 Bilateral primary osteoarthritis of hip: Secondary | ICD-10-CM | POA: Diagnosis not present

## 2020-10-11 DIAGNOSIS — Z8249 Family history of ischemic heart disease and other diseases of the circulatory system: Secondary | ICD-10-CM

## 2020-10-11 DIAGNOSIS — Z794 Long term (current) use of insulin: Secondary | ICD-10-CM | POA: Diagnosis not present

## 2020-10-11 DIAGNOSIS — M6282 Rhabdomyolysis: Secondary | ICD-10-CM

## 2020-10-11 DIAGNOSIS — Z87891 Personal history of nicotine dependence: Secondary | ICD-10-CM | POA: Diagnosis not present

## 2020-10-11 DIAGNOSIS — R000 Tachycardia, unspecified: Secondary | ICD-10-CM | POA: Diagnosis not present

## 2020-10-11 DIAGNOSIS — R059 Cough, unspecified: Secondary | ICD-10-CM

## 2020-10-11 DIAGNOSIS — Z6838 Body mass index (BMI) 38.0-38.9, adult: Secondary | ICD-10-CM

## 2020-10-11 DIAGNOSIS — T402X5A Adverse effect of other opioids, initial encounter: Secondary | ICD-10-CM | POA: Diagnosis not present

## 2020-10-11 DIAGNOSIS — E876 Hypokalemia: Secondary | ICD-10-CM | POA: Diagnosis not present

## 2020-10-11 LAB — COMPREHENSIVE METABOLIC PANEL
ALT: 25 U/L (ref 0–44)
AST: 51 U/L — ABNORMAL HIGH (ref 15–41)
Albumin: 3.2 g/dL — ABNORMAL LOW (ref 3.5–5.0)
Alkaline Phosphatase: 64 U/L (ref 38–126)
Anion gap: 10 (ref 5–15)
BUN: 11 mg/dL (ref 8–23)
CO2: 22 mmol/L (ref 22–32)
Calcium: 8.3 mg/dL — ABNORMAL LOW (ref 8.9–10.3)
Chloride: 104 mmol/L (ref 98–111)
Creatinine, Ser: 0.77 mg/dL (ref 0.44–1.00)
GFR, Estimated: >60 mL/min (ref >60)
Glucose, Bld: 166 mg/dL — ABNORMAL HIGH (ref 70–99)
Potassium: 3.3 mmol/L — ABNORMAL LOW (ref 3.5–5.1)
Sodium: 136 mmol/L (ref 135–145)
Total Bilirubin: 0.9 mg/dL (ref 0.3–1.2)
Total Protein: 6.3 g/dL — ABNORMAL LOW (ref 6.5–8.1)

## 2020-10-11 LAB — URINALYSIS, ROUTINE W REFLEX MICROSCOPIC
Bacteria, UA: NONE SEEN
Bilirubin Urine: NEGATIVE
Glucose, UA: >=500 mg/dL — AB
Hgb urine dipstick: NEGATIVE
Ketones, ur: 80 mg/dL — AB
Leukocytes,Ua: NEGATIVE
Nitrite: NEGATIVE
Protein, ur: NEGATIVE mg/dL
Specific Gravity, Urine: 1.029 (ref 1.005–1.030)
pH: 5 (ref 5.0–8.0)

## 2020-10-11 LAB — CBC WITH DIFFERENTIAL/PLATELET
Abs Immature Granulocytes: 0.08 10*3/uL — ABNORMAL HIGH (ref 0.00–0.07)
Basophils Absolute: 0.1 10*3/uL (ref 0.0–0.1)
Basophils Relative: 0 %
Eosinophils Absolute: 0 10*3/uL (ref 0.0–0.5)
Eosinophils Relative: 0 %
HCT: 35.5 % — ABNORMAL LOW (ref 36.0–46.0)
Hemoglobin: 10.8 g/dL — ABNORMAL LOW (ref 12.0–15.0)
Immature Granulocytes: 1 %
Lymphocytes Relative: 19 %
Lymphs Abs: 3.2 10*3/uL (ref 0.7–4.0)
MCH: 22.7 pg — ABNORMAL LOW (ref 26.0–34.0)
MCHC: 30.4 g/dL (ref 30.0–36.0)
MCV: 74.6 fL — ABNORMAL LOW (ref 80.0–100.0)
Monocytes Absolute: 1.9 10*3/uL — ABNORMAL HIGH (ref 0.1–1.0)
Monocytes Relative: 11 %
Neutro Abs: 11.9 10*3/uL — ABNORMAL HIGH (ref 1.7–7.7)
Neutrophils Relative %: 69 %
Platelets: 244 10*3/uL (ref 150–400)
RBC: 4.76 MIL/uL (ref 3.87–5.11)
RDW: 17.8 % — ABNORMAL HIGH (ref 11.5–15.5)
WBC: 17.2 10*3/uL — ABNORMAL HIGH (ref 4.0–10.5)
nRBC: 0 % (ref 0.0–0.2)

## 2020-10-11 LAB — CK: Total CK: 1755 U/L — ABNORMAL HIGH (ref 38–234)

## 2020-10-11 LAB — TROPONIN I (HIGH SENSITIVITY): Troponin I (High Sensitivity): 13 ng/L (ref <18)

## 2020-10-11 MED ORDER — MORPHINE SULFATE (PF) 4 MG/ML IV SOLN
4.0000 mg | Freq: Once | INTRAVENOUS | Status: AC
  Administered 2020-10-11: 4 mg via INTRAVENOUS
  Filled 2020-10-11: qty 1

## 2020-10-11 MED ORDER — ONDANSETRON HCL 4 MG/2ML IJ SOLN
4.0000 mg | Freq: Once | INTRAMUSCULAR | Status: AC
  Administered 2020-10-11: 4 mg via INTRAVENOUS
  Filled 2020-10-11: qty 2

## 2020-10-11 MED ORDER — SODIUM CHLORIDE 0.9 % IV BOLUS
1000.0000 mL | Freq: Once | INTRAVENOUS | Status: AC
  Administered 2020-10-11: 1000 mL via INTRAVENOUS

## 2020-10-11 MED ORDER — ACETAMINOPHEN 500 MG PO TABS
1000.0000 mg | ORAL_TABLET | Freq: Once | ORAL | Status: AC
  Administered 2020-10-11: 1000 mg via ORAL
  Filled 2020-10-11: qty 2

## 2020-10-11 NOTE — ED Provider Notes (Signed)
[facility] EMERGENCY DEPARTMENT Provider Note   CSN: [id number] Arrival date & time: 10/11/20  1918     History Chief Complaint  Patient presents with   Back Pain    Amanda Morris is a 70 y.o. female.  HPI     This is a 70 year old female who presents with back pain.  Patient had laminectomy on 6/16.  She was discharged from the hospital yesterday.  She states that when she got home she fell shortly thereafter.  She has been laying on the ground because she was unable to get up on her own.  She reports that her family found her today after she has been on the floor for approximately 24 hours.  She was able to ambulate with a walker and the help of her niece after of the fall but reports increasing pain.  She has not been able to take any pain medications.  She denies any new weakness or numbness of the lower extremities.  She denies any difficulty urinating.  She reports chills.  Chart reviewed.  Patient had lumbar laminectomy on Thursday by Dr. Cyndy Freeze.  Past Medical History:  Diagnosis Date   Allergy    Diabetes mellitus without complication ([place])    type 2   GERD (gastroesophageal reflux disease)    Headache    History of hiatal hernia    Hypercholesterolemia    Numbness    left, outer thigh   Palpitations    in the past, no current issues per patient   Postlaminectomy syndrome     Patient Active Problem List   Diagnosis Date Noted   Spinal stenosis 10/09/2020   Lumbar adjacent segment disease with spondylolisthesis 10/09/2020   Spinal stenosis, lumbar region with neurogenic claudication 04/16/2020   Peripheral neuropathy 11/28/2019   Paresthesia and pain of both upper extremities 11/12/2019   Chronic midline low back pain with bilateral sciatica 10/30/2019   Left thigh pain 10/30/2019   Numbness 10/30/2019   Meralgia paresthetica, left 09/12/2019   Tennis Must Quervain's disease (tenosynovitis) 02/06/2019   Acute bilateral low back pain without  sciatica 12/01/2018   Orthostatic hypotension 12/01/2018   Failed back syndrome of lumbar spine 11/25/2017   Fatty liver 09/27/2016   Status post bilateral breast reduction 03/23/2016   Spondylolisthesis of lumbar region 06/13/2015   History of lumbar fusion 04/07/2015   Radiculopathy, lumbar region 04/07/2015   Essential hypertension 02/14/2015   Postprandial vomiting 02/04/2014   Obesity (BMI 30-39.9) 11/01/2013   Carotid artery stenosis 06/07/2013   Hereditary and idiopathic peripheral neuropathy 06/07/2013   History of migraine 05/04/2013   Type 2 diabetes mellitus with other specified complication ([place]) 44/81/8563   SVT (supraventricular tachycardia) ([place]) 11/03/2010   GERD (gastroesophageal reflux disease) 10/29/2010   Chronic low back pain 10/29/2010   Hyperlipidemia associated with type 2 diabetes mellitus ([place]) 10/29/2010    Past Surgical History:  Procedure Laterality Date   ABDOMINAL HYSTERECTOMY  1982   BACK SURGERY     x 2    BREAST REDUCTION SURGERY Bilateral 03/17/2016   Procedure: MAMMARY REDUCTION  (BREAST);  Surgeon: Wallace Going, DO;  Location: [place];  Service: Plastics;  Laterality: Bilateral;   BREAST SURGERY     COLONOSCOPY     LUMBAR WOUND DEBRIDEMENT N/A 05/22/2020   Procedure: Incision and drainage of lumbar wound;  Surgeon: Ashok Pall, MD;  Location: [place];  Service: Neurosurgery;  Laterality: N/A;   SHOULDER SURGERY  06/2008, 09/2010  Right, by Dr. Karie Soda, then Dr. Judeth Horn   SPINAL CORD STIMULATOR INSERTION N/A 12/02/2017   Procedure: LUMBAR SPINAL CORD STIMULATOR INSERTION;  Surgeon: Ashok Pall, MD;  Location: [place];  Service: Neurosurgery;  Laterality: N/A;   SPINAL CORD STIMULATOR TRIAL N/A 11/25/2017   Procedure: INSERTION LUMBAR SPINAL CORD STIMULATOR TRIAL  ;  Surgeon: Ashok Pall, MD;  Location: [place];  Service: Neurosurgery;  Laterality: N/A;   INSERTION LUMBAR SPINAL CORD STIMULATOR TRIAL     UPPER GI  ENDOSCOPY       OB History   No obstetric history on file.     Family History  Problem Relation Age of Onset   Heart disease Mother    Diabetes Mother    Hypertension Mother    Stroke Mother    Heart disease Father    Heart disease Sister    Diabetes Sister    Hyperlipidemia Sister    Hypertension Sister    Stroke Sister    Alcohol abuse Brother    Hyperlipidemia Brother     Social History   Tobacco Use   Smoking status: Former    Pack years: 0.00    Types: Cigarettes    Quit date: 07/30/2007    Years since quitting: 13.2   Smokeless tobacco: Never  Vaping Use   Vaping Use: Never used  Substance Use Topics   Alcohol use: No   Drug use: No    Home Medications Prior to Admission medications   Medication Sig Start Date End Date Taking? Authorizing Provider  ACCU-CHEK AVIVA PLUS test strip FOR TESTING BLOOD SUGARS 3 TIMES DAILY. DX:E11.9 12/28/19   Hali Marry, MD  Accu-Chek Softclix Lancets lancets Dx:E11.9 12/10/19   Hali Marry, MD  aspirin EC 81 MG tablet Take 1 tablet (81 mg total) by mouth daily. 12/08/17   Costella, Vista Mink, PA-C  B-D ULTRAFINE III SHORT PEN 31G X 8 MM MISC USE AS DIRECTED 09/24/20   Hali Marry, MD  Blood Glucose Monitoring Suppl (ACCU-CHEK AVIVA PLUS) w/Device KIT Check blood sugars 3 times daily DX:E11.9 07/14/20   Hali Marry, MD  cholecalciferol (VITAMIN D) 1000 units tablet Take 1,000 Units by mouth daily.    [provider]  DEXILANT 60 MG capsule Take 60 mg by mouth daily.  10/25/15   [provider]  dicyclomine (BENTYL) 20 MG tablet Take 20 mg by mouth every evening. 08/06/19   [provider]  doxepin (SINEQUAN) 10 MG capsule Take 10 mg by mouth at bedtime. 10/13/17   [provider]  empagliflozin (JARDIANCE) 25 MG TABS tablet TAKE 25 MG BY MOUTH DAILY. Patient taking differently: Take 25 mg by mouth daily. 09/01/20   Hali Marry, MD  glipiZIDE (GLUCOTROL) 10  MG tablet Take 1 tablet (10 mg total) by mouth 2 (two) times daily before a meal. 09/01/20   Hali Marry, MD  insulin glargine (LANTUS SOLOSTAR) 100 UNIT/ML Solostar Pen Inject 36 Units into the skin at bedtime as needed (high blood sugar). 10/10/20   Ashok Pall, MD  Krill Oil 500 MG CAPS Take 500 mg by mouth daily.    [provider]  levocetirizine (XYZAL) 5 MG tablet Take 1 tablet (5 mg total) by mouth every evening. 09/01/20   Hali Marry, MD  Multiple Vitamins-Minerals (MULTIVITAMIN WOMEN 50+ PO) Take 1 capsule by mouth daily.    [provider]  ondansetron (ZOFRAN ODT) 4 MG disintegrating tablet Take 1 tablet (4 mg  total) by mouth every 8 (eight) hours as needed for nausea or vomiting. 09/01/20   Hali Marry, MD  OVER THE COUNTER MEDICATION Take 1 tablet by mouth 3 (three) times daily. Golo otc supplement    [provider]  perphenazine (TRILAFON) 8 MG tablet Take 8 mg by mouth at bedtime. 12/17/16   [provider]  pravastatin (PRAVACHOL) 40 MG tablet Take 1 tablet (40 mg total) by mouth at bedtime. 08/07/20   Breeback, Jade L, PA-C  pregabalin (LYRICA) 200 MG capsule TAKE 1 CAPSULE (200 MG TOTAL) BY MOUTH 3 (THREE) TIMES DAILY. 05/29/20   Marcial Pacas, MD  tiZANidine (ZANAFLEX) 4 MG tablet Take 1 tablet (4 mg total) by mouth every 6 (six) hours as needed for muscle spasms. 10/10/20   Ashok Pall, MD  traMADol (ULTRAM) 50 MG tablet Take 100 mg by mouth 2 (two) times daily. 09/04/20   [provider]  traMADol (ULTRAM) 50 MG tablet Take 1-2 tablets (50-100 mg total) by mouth every 6 (six) hours as needed for up to 7 days for moderate pain. 10/10/20 10/17/20  Ashok Pall, MD    Allergies    Penicillins, Duloxetine, Aspirin, Codeine, Hydrocodone, Metformin and related, Oxycodone, and Victoza [liraglutide]  Review of Systems   Review of Systems  Constitutional:  Positive for chills. Negative for fever.  Respiratory:  Negative  for shortness of breath.   Cardiovascular:  Negative for chest pain.  Gastrointestinal:  Negative for abdominal pain, nausea and vomiting.  Genitourinary:  Negative for difficulty urinating.  Musculoskeletal:  Positive for back pain.  Neurological:  Negative for weakness and numbness.  All other systems reviewed and are negative.  Physical Exam Updated Vital Signs BP 112/68 (BP Location: Left Arm)   Pulse (!) 101   Temp 98.6 F (37 C) (Oral)   Resp 17   Ht 1.626 m (_0 )   Wt 100 kg   LMP  (LMP Unknown)   SpO2 98%   BMI 37.84 kg/m   Physical Exam Vitals and nursing note reviewed.  Constitutional:      Appearance: She is well-developed. She is obese. She is not ill-appearing.  HENT:     Head: Normocephalic and atraumatic.     Mouth/Throat:     Mouth: Mucous membranes are dry.  Eyes:     Pupils: Pupils are equal, round, and reactive to light.  Cardiovascular:     Rate and Rhythm: Regular rhythm. Tachycardia present.     Heart sounds: Normal heart sounds.  Pulmonary:     Effort: Pulmonary effort is normal. No respiratory distress.     Breath sounds: No wheezing.  Abdominal:     General: Bowel sounds are normal.     Palpations: Abdomen is soft.     Tenderness: There is no abdominal tenderness.  Musculoskeletal:     Cervical back: Neck supple.     Comments: Tenderness to palpation lower lumbar spine in the midline, did not remove dressing but skin adjacent to the dressing appears clean dry and intact, no seroma or fluctuance noted  Skin:    General: Skin is warm and dry.  Neurological:     Mental Status: She is alert and oriented to person, place, and time.     Comments: 4+ out of 5 strength bilateral lower extremities plantar and dorsiflexion, patient with difficulty lifting the bilateral legs and hip flexion but relates this to pain, patellar reflexes equal bilaterally, no clonus  Psychiatric:  Mood and Affect: Mood normal.    ED Results / Procedures /  Treatments   Labs (all labs ordered are listed, but only abnormal results are displayed) Labs Reviewed  COMPREHENSIVE METABOLIC PANEL - Abnormal; Notable for the following components:      Result Value   Potassium 3.3 (*)    Glucose, Bld 166 (*)    Calcium 8.3 (*)    Total Protein 6.3 (*)    Albumin 3.2 (*)    AST 51 (*)    All other components within normal limits  CBC WITH DIFFERENTIAL/PLATELET - Abnormal; Notable for the following components:   WBC 17.2 (*)    Hemoglobin 10.8 (*)    HCT 35.5 (*)    MCV 74.6 (*)    MCH 22.7 (*)    RDW 17.8 (*)    Neutro Abs 11.9 (*)    Monocytes Absolute 1.9 (*)    Abs Immature Granulocytes 0.08 (*)    All other components within normal limits  CK - Abnormal; Notable for the following components:   Total CK 1,755 (*)    All other components within normal limits  URINALYSIS, ROUTINE W REFLEX MICROSCOPIC - Abnormal; Notable for the following components:   APPearance HAZY (*)    Glucose, UA >=500 (*)    Ketones, ur 80 (*)    All other components within normal limits  URINE CULTURE  TROPONIN I (HIGH SENSITIVITY)  TROPONIN I (HIGH SENSITIVITY)    EKG EKG Interpretation  Date/Time:  Saturday October 11 2020 19:55:46 EDT Ventricular Rate:  125 PR Interval:  126 QRS Duration: 80 QT Interval:  346 QTC Calculation: 499 R Axis:   97 Text Interpretation: Sinus tachycardia Rightward axis Possible Inferior infarct , age undetermined Cannot rule out Anterior infarct , age undetermined ST & T wave abnormality, consider lateral ischemia Abnormal ECG Confirmed by Thayer Jew [phone]) on 10/11/2020 11:17:00 PM  Radiology DG Chest 2 View  Result Date: 10/11/2020 CLINICAL DATA:  Tachycardia, back pain EXAM: CHEST - 2 VIEW COMPARISON:  07/24/2018, 10/09/2020, 08/29/2020 FINDINGS: Frontal and lateral views of the chest were obtained. Evaluation is limited by kyphosis and difficulty positioning the patient. The cardiac silhouette is grossly  unremarkable. Low lung volumes without airspace disease, effusion, or pneumothorax. Kyphosis centered at the thoracolumbar junction with prominent spondylosis unchanged. Stable spinal stimulator within the thoracic central canal. IMPRESSION: 1. No acute intrathoracic process. Electronically Signed   By: Randa Ngo M.D.   On: 10/11/2020 20:47   CT Lumbar Spine Wo Contrast  Result Date: 10/11/2020 CLINICAL DATA:  Recent lumbar spine surgery. Fall with low back pain. EXAM: CT LUMBAR SPINE WITHOUT CONTRAST TECHNIQUE: Multidetector CT imaging of the lumbar spine was performed without intravenous contrast administration. Multiplanar CT image reconstructions were also generated. COMPARISON:  None. FINDINGS: Segmentation: Standard Alignment: Grade 1 anterolisthesis at L3-4 and L4-5 Vertebrae: No acute fracture. L3-5 PLIF and L5-S1 posterior instrumented fusion. No perihardware lucency. Paraspinal and other soft tissues: Postsurgical changes within the dorsal midline soft tissues with small amount of gas and fluid. Disc levels: Limited assessment of the spinal canal at the postsurgical levels. Neural foramina are patent. No visible spinal canal stenosis. IMPRESSION: 1. No acute fracture or static subluxation of the lumbar spine. 2. L3-5 PLIF and L5-S1 posterior instrumented fusion without evidence of hardware complication. Electronically Signed   By: Ulyses Jarred M.D.   On: 10/11/2020 21:18    Procedures Procedures   Medications Ordered in ED Medications  acetaminophen (TYLENOL)  tablet 1,000 mg (1,000 mg Oral Given 10/11/20 2347)  sodium chloride 0.9 % bolus 1,000 mL (1,000 mLs Intravenous Bolus from Bag 10/11/20 2348)  morphine 4 MG/ML injection 4 mg (4 mg Intravenous Given 10/11/20 2348)  ondansetron (ZOFRAN) injection 4 mg (4 mg Intravenous Given 10/11/20 2347)    ED Course  I have reviewed the triage vital signs and the nursing notes.  Pertinent labs & imaging results that were available during my  care of the patient were reviewed by me and considered in my medical decision making (see chart for details).  Clinical Course as of 10/11/20 2352  Sat Oct 11, 2020  2329 Spoke with Dr. Venetia Constable.  Does not recommend any advanced imaging at this time.  Recommends treating rhabdo and pain management.  Request hospitalist admission.  He will consult on the patient [CH]    Clinical Course User Index [CH] Lloyd Cullinan, Barbette Hair, MD   MDM Rules/Calculators/A&P                          Patient presents with back pain.  She was discharged yesterday and reports falling when she got home.  She lives by herself.  She is tachycardic on my evaluation but otherwise nontoxic.  She clinically does appear dry.  She is not a great historian but it appears she may have been on the ground for up to 24 hours.  Family is not at the bedside to provide collateral information.  She has no signs or symptoms of cauda equina on exam.  Labs reviewed from triage.  She has 80 ketones in her urine and a CK of 1755.  Likely represents significant dehydration and potentially early rhabdomyolysis although she does not have hemoglobinuria yet.  Creatinine is preserved.  She has not had any pain medications since yesterday.  This could be contributing to her pain.  Patient was given pain and nausea medication.  Imaging from triage reviewed.  CT does not show any bony abnormalities and hardware appears in place.  See discussion with neurosurgery above.  Patient will need admission.  Requesting hospitalist admission given elevated CK and dehydration.  They will consult on the patient.  They do not recommend any advanced imaging at this time.   Final Clinical Impression(s) / ED Diagnoses Final diagnoses:  Acute bilateral low back pain without sciatica  Post-op pain  Dehydration  Non-traumatic rhabdomyolysis    Rx / DC Orders ED Discharge Orders     None        Merryl Hacker, MD 10/11/20 2355

## 2020-10-11 NOTE — H&P (Signed)
History and Physical  Patient Name: Amanda Morris     HYW:[id number]    DOB: Apr 28, 1950    DOA: 10/11/2020 PCP: Hali Marry, MD  Patient coming from: Home  Chief Complaint: Fall and remained on the ground, back pain    HPI: Amanda Morris is a 70 y.o. female, with PMH of insulin-dependent type 2 diabetes, dyslipidemia, hypertension, chronic back pain who presented to the ER on 10/11/2020 with back pain and being found on the ground for some period of time with recent history of lumbar surgery on 10/09/2020.  Patient was hospitalized for elective surgery on her back on 10/09/2020.  She had L5 and S1 decompressed and rods placed.  Apparently she tolerated the procedure well and she was discharged home the following day, 10/10/2020.  She then said she went home and while in the living room she dropped to the ground due to lower extremity weakness.  She did not hit her head and did not lose consciousness.  She says she has a walker at home but was not using.  She laid on the ground and could not get up.  She does not live with anyone at home.  Then sometime later, she had family check on her they found her on the ground.  They were able to get her up and she was able to ambulate some, but due to some worsening back pain, she came to the ED for evaluation.  She has not eaten or drinking or taking her medications due to being on the ground for extended period time.    ED course: -Vitals on admission: Afebrile, heart rate 123, respiratory rate 18, blood pressure 123/70, maintaining sats on room air -Labs on initial presentation: Sodium 136, potassium 3.3, chloride 104, bicarb 22, glucose 166, BUN 11, creatinine 0.77, calcium 8.3, albumin 3.2, WBC 17.2, hemoglobin 10.8, CK 1700 -Imaging obtained on admission:  Chest x-ray showed no acute processes.  CT lumbar spine showed no acute processes and findings consistent with recent surgery. -In the ED the patient was given 1 L of IV fluids,  Zofran, morphine, Tylenol.  Neurosurgery was contacted by the ER, recommended admission to the hospital service for hydration, no advanced/further imaging recommended, and the hospitalist service was contacted for further evaluation and management.     ROS: A complete and thorough 12 point review of systems obtained, negative listed in HPI.     Past Medical History:  Diagnosis Date   Allergy    Diabetes mellitus without complication ([place])    type 2   GERD (gastroesophageal reflux disease)    Headache    History of hiatal hernia    Hypercholesterolemia    Numbness    left, outer thigh   Palpitations    in the past, no current issues per patient   Postlaminectomy syndrome     Past Surgical History:  Procedure Laterality Date   ABDOMINAL HYSTERECTOMY  1982   BACK SURGERY     x 2    BREAST REDUCTION SURGERY Bilateral 03/17/2016   Procedure: MAMMARY REDUCTION  (BREAST);  Surgeon: Wallace Going, DO;  Location: [facility];  Service: Plastics;  Laterality: Bilateral;   BREAST SURGERY     COLONOSCOPY     LUMBAR WOUND DEBRIDEMENT N/A 05/22/2020   Procedure: Incision and drainage of lumbar wound;  Surgeon: Ashok Pall, MD;  Location: [facility];  Service: Neurosurgery;  Laterality: N/A;   SHOULDER SURGERY  06/2008, 09/2010   Right, by Dr.  Richie, then Dr. Judeth Horn   SPINAL CORD STIMULATOR INSERTION N/A 12/02/2017   Procedure: LUMBAR SPINAL CORD STIMULATOR INSERTION;  Surgeon: Ashok Pall, MD;  Location: [facility];  Service: Neurosurgery;  Laterality: N/A;   SPINAL CORD STIMULATOR TRIAL N/A 11/25/2017   Procedure: INSERTION LUMBAR SPINAL CORD STIMULATOR TRIAL  ;  Surgeon: Ashok Pall, MD;  Location: [facility];  Service: Neurosurgery;  Laterality: N/A;   INSERTION LUMBAR SPINAL CORD STIMULATOR TRIAL     UPPER GI ENDOSCOPY      Social History: Patient lives at home.  The patient walks with a walker. Non smoker.  Allergies  Allergen Reactions   Penicillins Hives  and Other (See Comments)    PATIENT HAS HAD A PCN REACTION WITH IMMEDIATE RASH, FACIAL/TONGUE/THROAT SWELLING, SOB, OR LIGHTHEADEDNESS WITH HYPOTENSION:  #  #  YES  #  #  Has patient had a PCN reaction causing severe rash involving mucus membranes or skin necrosis: No Has patient had a PCN reaction that required hospitalization: No Has patient had a PCN reaction occurring within the last 10 years: No If all of the above answers are "NO", then may proceed with Cephalosporin use.    Duloxetine Other (See Comments)    GI upset   Aspirin Nausea Only   Codeine Itching   Hydrocodone Nausea Only    Nose bleed   Metformin And Related Diarrhea and Nausea Only   Oxycodone Nausea And Vomiting    GI Upset (intolerance)   Victoza [Liraglutide] Other (See Comments)    Abdominal pain    Family history: family history includes Alcohol abuse in her brother; Diabetes in her mother and sister; Heart disease in her father, mother, and sister; Hyperlipidemia in her brother and sister; Hypertension in her mother and sister; Stroke in her mother and sister.  Prior to Admission medications   Medication Sig Start Date End Date Taking? Authorizing Provider  ACCU-CHEK AVIVA PLUS test strip FOR TESTING BLOOD SUGARS 3 TIMES DAILY. DX:E11.9 12/28/19   Hali Marry, MD  Accu-Chek Softclix Lancets lancets Dx:E11.9 12/10/19   Hali Marry, MD  aspirin EC 81 MG tablet Take 1 tablet (81 mg total) by mouth daily. 12/08/17   Costella, Vista Mink, PA-C  B-D ULTRAFINE III SHORT PEN 31G X 8 MM MISC USE AS DIRECTED 09/24/20   Hali Marry, MD  Blood Glucose Monitoring Suppl (ACCU-CHEK AVIVA PLUS) w/Device KIT Check blood sugars 3 times daily DX:E11.9 07/14/20   Hali Marry, MD  cholecalciferol (VITAMIN D) 1000 units tablet Take 1,000 Units by mouth daily.    [provider]  DEXILANT 60 MG capsule Take 60 mg by mouth daily.  10/25/15   [provider]  dicyclomine (BENTYL) 20 MG  tablet Take 20 mg by mouth every evening. 08/06/19   [provider]  doxepin (SINEQUAN) 10 MG capsule Take 10 mg by mouth at bedtime. 10/13/17   [provider]  empagliflozin (JARDIANCE) 25 MG TABS tablet TAKE 25 MG BY MOUTH DAILY. Patient taking differently: Take 25 mg by mouth daily. 09/01/20   Hali Marry, MD  glipiZIDE (GLUCOTROL) 10 MG tablet Take 1 tablet (10 mg total) by mouth 2 (two) times daily before a meal. 09/01/20   Hali Marry, MD  insulin glargine (LANTUS SOLOSTAR) 100 UNIT/ML Solostar Pen Inject 36 Units into the skin at bedtime as needed (high blood sugar). 10/10/20   Ashok Pall, MD  Krill Oil 500 MG CAPS Take 500 mg by mouth daily.  [provider]  levocetirizine (XYZAL) 5 MG tablet Take 1 tablet (5 mg total) by mouth every evening. 09/01/20   Hali Marry, MD  Multiple Vitamins-Minerals (MULTIVITAMIN WOMEN 50+ PO) Take 1 capsule by mouth daily.    [provider]  ondansetron (ZOFRAN ODT) 4 MG disintegrating tablet Take 1 tablet (4 mg total) by mouth every 8 (eight) hours as needed for nausea or vomiting. 09/01/20   Hali Marry, MD  OVER THE COUNTER MEDICATION Take 1 tablet by mouth 3 (three) times daily. Golo otc supplement    [provider]  perphenazine (TRILAFON) 8 MG tablet Take 8 mg by mouth at bedtime. 12/17/16   [provider]  pravastatin (PRAVACHOL) 40 MG tablet Take 1 tablet (40 mg total) by mouth at bedtime. 08/07/20   Breeback, Jade L, PA-C  pregabalin (LYRICA) 200 MG capsule TAKE 1 CAPSULE (200 MG TOTAL) BY MOUTH 3 (THREE) TIMES DAILY. 05/29/20   Marcial Pacas, MD  tiZANidine (ZANAFLEX) 4 MG tablet Take 1 tablet (4 mg total) by mouth every 6 (six) hours as needed for muscle spasms. 10/10/20   Ashok Pall, MD  traMADol (ULTRAM) 50 MG tablet Take 100 mg by mouth 2 (two) times daily. 09/04/20   [provider]  traMADol (ULTRAM) 50 MG tablet Take 1-2 tablets (50-100 mg total) by  mouth every 6 (six) hours as needed for up to 7 days for moderate pain. 10/10/20 10/17/20  Ashok Pall, MD       Physical Exam: BP 112/68 (BP Location: Left Arm)   Pulse (!) 101   Temp 98.6 F (37 C) (Oral)   Resp 17   Ht 5' 4"  (1.626 m)   Wt 100 kg   LMP  (LMP Unknown)   SpO2 98%   BMI 37.84 kg/m   General appearance: Well-developed, adult female, alert and in no acute distress .   Eyes: Anicteric, conjunctiva pink, lids and lashes normal. PERRL.    ENT: No nasal deformity, discharge, epistaxis.  Hearing intact. OP moist without lesions.   Neck: No neck masses.  Trachea midline.  No thyromegaly/tenderness. Lymph: No cervical or supraclavicular lymphadenopathy. Skin: Warm and dry.  No jaundice.  No suspicious rashes or lesions. Cardiac: RRR, nl S1-S2, no murmurs appreciated.  No LE edema.  Radial and pedal pulses 2+ and symmetric. Respiratory: Normal respiratory rate and rhythm.  CTAB without rales or wheezes. Abdomen: Abdomen soft.  No tenderness with palpation. No ascites, distension, hepatosplenomegaly.   MSK: No deformities or effusions of the large joints of the upper or lower extremities bilaterally.  No cyanosis or clubbing. Neuro: Cranial nerves 2 through 12 grossly intact.  Sensation intact to light touch. Speech is fluent.  Marland Kitchen    Psych: Sensorium intact and responding to questions, attention normal.  Behavior appropriate.  Judgment and insight appear normal.    Labs on Admission:  I have personally reviewed following labs and imaging studies: CBC: Recent Labs  Lab 10/07/20 0900 10/09/20 1606 10/11/20 2025  WBC 9.6  --  17.2*  NEUTROABS  --   --  11.9*  HGB 12.8 12.6 10.8*  HCT 42.5 37.0 35.5*  MCV 75.4*  --  74.6*  PLT 266  --  749   Basic Metabolic Panel: Recent Labs  Lab 10/07/20 0900 10/09/20 1606 10/11/20 2025  NA 137 145 136  K 3.8 3.7 3.3*  CL 106 107 104  CO2 24  --  22  GLUCOSE 199* 88 166*  BUN 7*  9 11  CREATININE 0.81 0.60 0.77  CALCIUM  8.6*  --  8.3*   GFR: Estimated Creatinine Clearance: 78.4 mL/min (by C-G formula based on SCr of 0.77 mg/dL).  Liver Function Tests: Recent Labs  Lab 10/11/20 2025  AST 51*  ALT 25  ALKPHOS 64  BILITOT 0.9  PROT 6.3*  ALBUMIN 3.2*   No results for input(s): LIPASE, AMYLASE in the last 168 hours. No results for input(s): AMMONIA in the last 168 hours. Coagulation Profile: No results for input(s): INR, PROTIME in the last 168 hours. Cardiac Enzymes: Recent Labs  Lab 10/11/20 2025  CKTOTAL 1,755*   BNP (last 3 results) No results for input(s): PROBNP in the last 8760 hours. HbA1C: No results for input(s): HGBA1C in the last 72 hours. CBG: Recent Labs  Lab 10/09/20 2006 10/09/20 2211 10/09/20 2346 10/10/20 0425 10/10/20 0648  GLUCAP 147* 170* 181* 208* 146*   Lipid Profile: No results for input(s): CHOL, HDL, LDLCALC, TRIG, CHOLHDL, LDLDIRECT in the last 72 hours. Thyroid Function Tests: No results for input(s): TSH, T4TOTAL, FREET4, T3FREE, THYROIDAB in the last 72 hours. Anemia Panel: No results for input(s): VITAMINB12, FOLATE, FERRITIN, TIBC, IRON, RETICCTPCT in the last 72 hours.   Recent Results (from the past 240 hour(s))  Surgical pcr screen     Status: None   Collection Time: 10/07/20  8:13 AM   Specimen: Nasal Mucosa; Nasal Swab  Result Value Ref Range Status   MRSA, PCR NEGATIVE NEGATIVE Final   Staphylococcus aureus NEGATIVE NEGATIVE Final    Comment: (NOTE) The Xpert SA Assay (FDA approved for NASAL specimens in patients 71 years of age and older), is one component of a comprehensive surveillance program. It is not intended to diagnose infection nor to guide or monitor treatment. Performed at [facility], [place] [address] [place], [place] [address]   SARS CORONAVIRUS 2 (TAT 6-24 HRS) Nasopharyngeal Nasopharyngeal Swab     Status: None   Collection Time: 10/07/20  8:54 AM   Specimen: Nasopharyngeal Swab  Result Value Ref Range Status    SARS Coronavirus 2 NEGATIVE NEGATIVE Final    Comment: (NOTE) SARS-CoV-2 target nucleic acids are NOT DETECTED.  The SARS-CoV-2 RNA is generally detectable in upper and lower respiratory specimens during the acute phase of infection. Negative results do not preclude SARS-CoV-2 infection, do not rule out co-infections with other pathogens, and should not be used as the sole basis for treatment or other patient management decisions. Negative results must be combined with clinical observations, patient history, and epidemiological information. The expected result is Negative.  Fact Sheet for Patients: SugarRoll.be  Fact Sheet for Healthcare Providers: https://www.woods-mathews.com/  This test is not yet approved or cleared by the [place] FDA and  has been authorized for detection and/or diagnosis of SARS-CoV-2 by FDA under an Emergency Use Authorization (EUA). This EUA will remain  in effect (meaning this test can be used) for the duration of the COVID-19 declaration under Se ction 564(b)(1) of the Act, 21 U.S.C. section 360bbb-3(b)(1), unless the authorization is terminated or revoked sooner.  Performed at [facility], [place] [address], [place], [place] [address]            Radiological Exams on Admission: Personally reviewed imaging which shows: Chest x-ray showed no acute processes.  CT lumbar spine showed no acute processes and findings consistent with recent surgery. DG Chest 2 View  Result Date: 10/11/2020 CLINICAL DATA:  Tachycardia, back pain EXAM: CHEST -  2 VIEW COMPARISON:  07/24/2018, 10/09/2020, 08/29/2020 FINDINGS: Frontal and lateral views of the chest were obtained. Evaluation is limited by kyphosis and difficulty positioning the patient. The cardiac silhouette is grossly unremarkable. Low lung volumes without airspace disease, effusion, or pneumothorax. Kyphosis centered at the thoracolumbar junction with prominent  spondylosis unchanged. Stable spinal stimulator within the thoracic central canal. IMPRESSION: 1. No acute intrathoracic process. Electronically Signed   By: Randa Ngo M.D.   On: 10/11/2020 20:47   CT Lumbar Spine Wo Contrast  Result Date: 10/11/2020 CLINICAL DATA:  Recent lumbar spine surgery. Fall with low back pain. EXAM: CT LUMBAR SPINE WITHOUT CONTRAST TECHNIQUE: Multidetector CT imaging of the lumbar spine was performed without intravenous contrast administration. Multiplanar CT image reconstructions were also generated. COMPARISON:  None. FINDINGS: Segmentation: Standard Alignment: Grade 1 anterolisthesis at L3-4 and L4-5 Vertebrae: No acute fracture. L3-5 PLIF and L5-S1 posterior instrumented fusion. No perihardware lucency. Paraspinal and other soft tissues: Postsurgical changes within the dorsal midline soft tissues with small amount of gas and fluid. Disc levels: Limited assessment of the spinal canal at the postsurgical levels. Neural foramina are patent. No visible spinal canal stenosis. IMPRESSION: 1. No acute fracture or static subluxation of the lumbar spine. 2. L3-5 PLIF and L5-S1 posterior instrumented fusion without evidence of hardware complication. Electronically Signed   By: Ulyses Jarred M.D.   On: 10/11/2020 21:18           Assessment/Plan   S/P L5-S1 with decompression and lumbar fusion -Surgery was performed on 10/09/2020 and was discharged on 10/10/2020 -Lumbar CT on admission showed no acute processes.  No further imaging recommended by neurosurgery - Neurosurgery contacted by the ER, will evaluate patient, but recommended admission to the hospital service - Pain control as warranted -Restart home Lyrica once home medications are reconciled  2. S/P ground-level fall -Shortly after returning home, patient had a ground-level fall due to lower extremity weakness.  Denies hitting her head or loss of consciousness - Fall precautions - PT consulted - See further  plans above  3.  Elevated CK -On admission CK 1700 -UA did not show any hematuria and no AKI seen on admission - 1 L of IV fluids ordered in the ER, will continue maintenance IV fluids afterwards - Repeat CK ordered for the morning  4.  Insulin-dependent type 2 diabetes -Given lack of p.o. intake and glucose 160s on admission, will hold off on restarting home long-acting insulin -Hold home Jardiance given hypovolemia - Glucose checks, sliding scale - ADA diet  5.  Dyslipidemia -Hold home statin for now given elevated CK  6.  Obesity -BMI 38 - Recommend low calorie diet, exercise, weight loss     DVT prophylaxis: Lovenox Code Status: full  Family Communication: None Disposition Plan: Anticipate discharge home when medically optimized Consults called: Neuro surgery consulted by ER Admission status: observation   At the point of initial evaluation, it is my clinical opinion that admission for OBSERVATION is reasonable and necessary because the patient's presenting complaints in the context of their chronic conditions represent sufficient risk of deterioration or significant morbidity to constitute reasonable grounds for close observation in the hospital setting, but that the patient may be medically stable for discharge from the hospital within 24 to 48 hours.    Medical decision making: Patient seen at 11:58 PM on 10/11/2020.  The patient was discussed with ER provider.  What exists of the patient's chart was reviewed in depth and summarized above.  Clinical  condition: Fair.        Doran Heater [facility] Please page though [facility] [place] or Epic secure chat:  For password, contact charge nurse

## 2020-10-11 NOTE — ED Triage Notes (Addendum)
Pt arrives POV for eval of lower back pain s/p fall after surgery. Pt denies new numbness/tingling. No unilateral weakness. Pt reports she fell yesterday and was unable to get up after falling, and has had worsening pain since then.Pt reports back surgery on Thursday lumbar sacral fusion and decompression w/ hardware

## 2020-10-11 NOTE — ED Provider Notes (Signed)
Emergency Medicine Provider Triage Evaluation Note  Amanda Morris , a 70 y.o. female  was evaluated in triage.  Pt complains of worsening low back pain after a fall yesterday.  Patient recently underwent L5-S1 decompression and hardware placement.  She was discharged home yesterday after she arrived home had a fall.  She was unable to get up off the ground on her own in light of the ground for several hours.  Family arrived to check on her later that evening was able to get her up.  States has not been able to ambulate since the fall, but denies any numbness, tingling, or weakness in her legs.  Denies any saddle anesthesia or difficulty urinating.  She does endorse worsening low back pain and chills.  Additionally she is tachycardic in triage.  Review of Systems  Positive: Low back pain, recent surgical procedure with surgical wound.  Chills. Negative: Fevers, nausea, vomiting  Physical Exam  BP 123/70 (BP Location: Left Arm)   Pulse (!) 123   Temp 98.6 F (37 C) (Oral)   Resp 18   Ht 5\' 4"  (1.626 m)   Wt 100 kg   LMP  (LMP Unknown)   SpO2 98%   BMI 37.84 kg/m  Gen:   Awake, no distress   Resp:  Normal effort  MSK:   Moves extremities without difficulty  Other:  Surgical bandage over lumbar surgical incision, sensation and strength are symmetric and normal in the lower extremities bilaterally.  Medical Decision Making  Medically screening exam initiated at 7:51 PM.  Appropriate orders placed.  Amanda Morris was informed that the remainder of the evaluation will be completed by another provider, this initial triage assessment does not replace that evaluation, and the importance of remaining in the ED until their evaluation is complete.  This chart was dictated using voice recognition software, Dragon. Despite the best efforts of this provider to proofread and correct errors, errors may still occur which can change documentation meaning.    Aura Dials 10/11/20 1953    Arnaldo Natal, MD 10/12/20 1249

## 2020-10-12 ENCOUNTER — Observation Stay (HOSPITAL_COMMUNITY): Payer: Medicare Other

## 2020-10-12 ENCOUNTER — Other Ambulatory Visit: Payer: Self-pay

## 2020-10-12 DIAGNOSIS — W19XXXA Unspecified fall, initial encounter: Secondary | ICD-10-CM | POA: Diagnosis present

## 2020-10-12 DIAGNOSIS — R748 Abnormal levels of other serum enzymes: Secondary | ICD-10-CM

## 2020-10-12 DIAGNOSIS — M6282 Rhabdomyolysis: Secondary | ICD-10-CM

## 2020-10-12 DIAGNOSIS — Z9181 History of falling: Secondary | ICD-10-CM | POA: Diagnosis not present

## 2020-10-12 DIAGNOSIS — M545 Low back pain, unspecified: Secondary | ICD-10-CM | POA: Diagnosis not present

## 2020-10-12 DIAGNOSIS — M160 Bilateral primary osteoarthritis of hip: Secondary | ICD-10-CM | POA: Diagnosis not present

## 2020-10-12 LAB — BASIC METABOLIC PANEL
Anion gap: 8 (ref 5–15)
BUN: 12 mg/dL (ref 8–23)
CO2: 22 mmol/L (ref 22–32)
Calcium: 7.8 mg/dL — ABNORMAL LOW (ref 8.9–10.3)
Chloride: 111 mmol/L (ref 98–111)
Creatinine, Ser: 0.63 mg/dL (ref 0.44–1.00)
GFR, Estimated: >60 mL/min (ref >60)
Glucose, Bld: 94 mg/dL (ref 70–99)
Potassium: 3.3 mmol/L — ABNORMAL LOW (ref 3.5–5.1)
Sodium: 141 mmol/L (ref 135–145)

## 2020-10-12 LAB — CBG MONITORING, ED
Glucose-Capillary: 121 mg/dL — ABNORMAL HIGH (ref 70–99)
Glucose-Capillary: 104 mg/dL — ABNORMAL HIGH (ref 70–99)
Glucose-Capillary: 90 mg/dL (ref 70–99)

## 2020-10-12 LAB — GLUCOSE, CAPILLARY
Glucose-Capillary: 95 mg/dL (ref 70–99)
Glucose-Capillary: 85 mg/dL (ref 70–99)

## 2020-10-12 LAB — PHOSPHORUS: Phosphorus: 2.6 mg/dL (ref 2.5–4.6)

## 2020-10-12 LAB — CK: Total CK: 832 U/L — ABNORMAL HIGH (ref 38–234)

## 2020-10-12 LAB — SARS CORONAVIRUS 2 (TAT 6-24 HRS): SARS Coronavirus 2: NEGATIVE

## 2020-10-12 LAB — MAGNESIUM: Magnesium: 1.9 mg/dL (ref 1.7–2.4)

## 2020-10-12 MED ORDER — ONDANSETRON HCL 4 MG/2ML IJ SOLN: 4.0000 mg | Freq: Four times a day (QID) | INTRAMUSCULAR | Status: DC | PRN

## 2020-10-12 MED ORDER — ACETAMINOPHEN 650 MG RE SUPP: 650.0000 mg | Freq: Four times a day (QID) | RECTAL | Status: DC | PRN

## 2020-10-12 MED ORDER — SODIUM CHLORIDE 0.9 % IV SOLN: INTRAVENOUS | Status: AC

## 2020-10-12 MED ORDER — ACETAMINOPHEN 325 MG PO TABS
650.0000 mg | ORAL_TABLET | Freq: Four times a day (QID) | ORAL | Status: DC | PRN
  Administered 2020-10-18: 650 mg via ORAL
  Filled 2020-10-12 (×2): qty 2

## 2020-10-12 MED ORDER — ONDANSETRON HCL 4 MG PO TABS
4.0000 mg | ORAL_TABLET | Freq: Four times a day (QID) | ORAL | Status: DC | PRN
  Administered 2020-10-14: 4 mg via ORAL
  Filled 2020-10-12: qty 1

## 2020-10-12 MED ORDER — INSULIN ASPART 100 UNIT/ML IJ SOLN
0.0000 [IU] | Freq: Every day | INTRAMUSCULAR | Status: DC
Start: 2020-10-12 — End: 2020-10-18

## 2020-10-12 MED ORDER — POTASSIUM CHLORIDE CRYS ER 20 MEQ PO TBCR
40.0000 meq | EXTENDED_RELEASE_TABLET | Freq: Two times a day (BID) | ORAL | Status: AC
  Administered 2020-10-12 (×2): 40 meq via ORAL
  Filled 2020-10-12 (×2): qty 2

## 2020-10-12 MED ORDER — INSULIN ASPART 100 UNIT/ML IJ SOLN
0.0000 [IU] | Freq: Three times a day (TID) | INTRAMUSCULAR | Status: DC
  Administered 2020-10-14 (×2): 2 [IU] via SUBCUTANEOUS
  Administered 2020-10-14: 3 [IU] via SUBCUTANEOUS
  Administered 2020-10-15 – 2020-10-18 (×6): 2 [IU] via SUBCUTANEOUS
  Administered 2020-10-18: 3 [IU] via SUBCUTANEOUS

## 2020-10-12 MED ORDER — ENOXAPARIN SODIUM 40 MG/0.4ML IJ SOSY
40.0000 mg | PREFILLED_SYRINGE | INTRAMUSCULAR | Status: DC
  Administered 2020-10-12 – 2020-10-18 (×7): 40 mg via SUBCUTANEOUS
  Filled 2020-10-12 (×7): qty 0.4

## 2020-10-12 MED ORDER — ALBUTEROL SULFATE (2.5 MG/3ML) 0.083% IN NEBU: 2.5000 mg | INHALATION_SOLUTION | Freq: Four times a day (QID) | RESPIRATORY_TRACT | Status: DC | PRN

## 2020-10-12 MED ORDER — HYDROCODONE-ACETAMINOPHEN 5-325 MG PO TABS
1.0000 | ORAL_TABLET | ORAL | Status: DC | PRN
Start: 2020-10-12 — End: 2020-10-18
  Administered 2020-10-12 – 2020-10-14 (×7): 2 via ORAL
  Administered 2020-10-16 – 2020-10-18 (×4): 1 via ORAL
  Filled 2020-10-12: qty 2
  Filled 2020-10-12 (×2): qty 1
  Filled 2020-10-12: qty 2
  Filled 2020-10-12: qty 1
  Filled 2020-10-12 (×3): qty 2
  Filled 2020-10-12: qty 1
  Filled 2020-10-12 (×2): qty 2

## 2020-10-12 NOTE — Progress Notes (Signed)
Patient admitted to room, alert and oriented x4. Dressing to lower back with moderate amount of old blood noted. No pain at this time.

## 2020-10-12 NOTE — ED Notes (Signed)
Neuro surgery at bedside.

## 2020-10-12 NOTE — ED Notes (Signed)
Pt transported to xray 

## 2020-10-12 NOTE — Plan of Care (Signed)
  Problem: Activity: Goal: Risk for activity intolerance will decrease Outcome: Progressing   Problem: Nutrition: Goal: Adequate nutrition will be maintained Outcome: Progressing   Problem: Pain Managment: Goal: General experience of comfort will improve Outcome: Progressing   Problem: Safety: Goal: Ability to remain free from injury will improve Outcome: Progressing   

## 2020-10-12 NOTE — Progress Notes (Signed)
Assessed pt unable to find a good vein to insert IV.

## 2020-10-12 NOTE — Progress Notes (Signed)
PROGRESS NOTE    Amanda Morris  OXB:[id number] DOB: 10-Mar-1951 DOA: 10/11/2020 PCP: Hali Marry, MD     Brief Narrative:  70 y.o. BF PMHx insulin-dependent type 2 diabetes, dyslipidemia, HTN, chronic back pain   Presented to the ER on 10/11/2020 with back pain and being found on the ground for some period of time with recent history of lumbar surgery on 10/09/2020.   Patient was hospitalized for elective surgery on her back on 10/09/2020.  She had L5 and S1 decompressed and rods placed.  Apparently she tolerated the procedure well and she was discharged home the following day, 10/10/2020.  She then said she went home and while in the living room she dropped to the ground due to lower extremity weakness.  She did not hit her head and did not lose consciousness.  She says she has a walker at home but was not using.  She laid on the ground and could not get up.  She does not live with anyone at home.  Then sometime later, she had family check on her they found her on the ground.  They were able to get her up and she was able to ambulate some, but due to some worsening back pain, she came to the ED for evaluation.  She has not eaten or drinking or taking her medications due to being on the ground for extended period time.       ED course: -Vitals on admission: Afebrile, heart rate 123, respiratory rate 18, blood pressure 123/70, maintaining sats on room air -Labs on initial presentation: Sodium 136, potassium 3.3, chloride 104, bicarb 22, glucose 166, BUN 11, creatinine 0.77, calcium 8.3, albumin 3.2, WBC 17.2, hemoglobin 10.8, CK 1700 -Imaging obtained on admission:  Chest x-ray showed no acute processes.  CT lumbar spine showed no acute processes and findings consistent with recent surgery. -In the ED the patient was given 1 L of IV fluids, Zofran, morphine, Tylenol.  Neurosurgery was contacted by the ER, recommended admission to the hospital service for hydration, no  advanced/further imaging recommended, and the hospitalist service was contacted for further evaluation and management.   Subjective: Patient seen earlier in the day by colleague no charge patient seen early in   Assessment & Plan: Covid vaccination;   Active Problems:   Chronic low back pain   Hyperlipidemia associated with type 2 diabetes mellitus ([place])   Type 2 diabetes mellitus with other specified complication (HCC)   Obesity (BMI 30-39.9)   Essential hypertension   Spinal stenosis   Fall   Elevated CK  S/P L5-S1 with decompression and lumbar fusion -Surgery was performed on 10/09/2020 and was discharged on 10/10/2020 -Lumbar CT on admission showed no acute processes.  No further imaging recommended by neurosurgery - Neurosurgery contacted by the ER, will evaluate patient, but recommended admission to the hospital service - Pain control as warranted -Restart home Lyrica once home medications are reconciled   S/P ground-level fall/ -Shortly after returning home, patient had a ground-level fall due to lower extremity weakness.  Denies hitting her head or loss of consciousness - Fall precautions - PT consulted - See further plans above   Elevated CK/Rhabdomyolysis -On admission CK 1700 -UA did not show any hematuria and no AKI seen on admission - 1 L of IV fluids ordered in the ER, will continue maintenance IV fluids afterwards Lab Results  Component Value Date   CKTOTAL 832 (H) 10/12/2020   CKTOTAL 1,755 (H) 10/11/2020   CKTOTAL  141 11/28/2019     4.  Insulin-dependent type 2 diabetes -Given lack of p.o. intake and glucose 160s on admission, will hold off on restarting home long-acting insulin -Hold home Jardiance given hypovolemia - Glucose checks, sliding scale - ADA diet -A1c pending   5.  Dyslipidemia -Hold home statin for now given elevated CK -Lipid panel pending   Obese (BMI 37.84 kg/m) -BMI 38 - Recommend low calorie diet, exercise, weight loss     Hypokalemia - Potassium goal> 4 - K-Dur 40 mEq x 2 doses      DVT prophylaxis: Lovenox Code Status: Full Family Communication:  Status is: Inpatient    Dispo: The patient is from: Home              Anticipated d/c is to: Home              Anticipated d/c date is: 6/23              Patient currently unstable      Consultants:    Procedures/Significant Events:    I have personally reviewed and interpreted all radiology studies and my findings are as above.  VENTILATOR SETTINGS:    Cultures 6/19 SARS coronavirus negative   Antimicrobials:    Devices    LINES / TUBES:      Continuous Infusions:  sodium chloride 100 mL/hr at 10/12/20 0142     Objective: Vitals:   10/12/20 0630 10/12/20 0715 10/12/20 0800 10/12/20 0900  BP: (!) 100/59 101/64 104/63 107/63  Pulse: 91  98 93  Resp: 16 16 16 16   Temp:      TempSrc:      SpO2: 96% 98% 97% 97%  Weight:      Height:        Intake/Output Summary (Last 24 hours) at 10/12/2020 0933 Last data filed at 10/12/2020 0117 Gross per 24 hour  Intake 1000 ml  Output --  Net 1000 ml   Filed Weights   10/11/20 1937  Weight: 100 kg    Examination:  General: No acute respiratory distress Eyes: negative scleral hemorrhage, negative anisocoria, negative icterus ENT: Negative Runny nose, negative gingival bleeding, Neck:  Negative scars, masses, torticollis, lymphadenopathy, JVD Lungs: Clear to auscultation bilaterally without wheezes or crackles Cardiovascular: Regular rate and rhythm without murmur gallop or rub normal S1 and S2 Abdomen: negative abdominal pain, nondistended, positive soft, bowel sounds, no rebound, no ascites, no appreciable mass Extremities: No significant cyanosis, clubbing, or edema bilateral lower extremities Skin: Negative rashes, lesions, ulcers Psychiatric:  Negative depression, negative anxiety, negative fatigue, negative mania  Central nervous system:  Cranial nerves II  through XII intact, tongue/uvula midline, all extremities muscle strength 5/5, sensation intact throughout, finger nose finger bilateral within normal limits, quick finger touch bilateral within normal limits, negative Romberg sign, heel to shin bilateral within normal limits, standing on 1 foot bilateral within normal limits, walking on tiptoes within normal limits, walking on heels within normal limits, negative dysarthria, negative expressive aphasia, negative receptive aphasia.  .     Data Reviewed: Care during the described time interval was provided by me .  I have reviewed this patient's available data, including medical history, events of note, physical examination, and all test results as part of my evaluation.  CBC: Recent Labs  Lab 10/07/20 0900 10/09/20 1606 10/11/20 2025  WBC 9.6  --  17.2*  NEUTROABS  --   --  11.9*  HGB 12.8 12.6 10.8*  HCT 42.5 37.0  35.5*  MCV 75.4*  --  74.6*  PLT 266  --  774   Basic Metabolic Panel: Recent Labs  Lab 10/07/20 0900 10/09/20 1606 10/11/20 2025 10/12/20 0800  NA 137 145 136 141  K 3.8 3.7 3.3* 3.3*  CL 106 107 104 111  CO2 24  --  22 22  GLUCOSE 199* 88 166* 94  BUN 7* 9 11 12   CREATININE 0.81 0.60 0.77 0.63  CALCIUM 8.6*  --  8.3* 7.8*   GFR: Estimated Creatinine Clearance: 78.4 mL/min (by C-G formula based on SCr of 0.63 mg/dL). Liver Function Tests: Recent Labs  Lab 10/11/20 2025  AST 51*  ALT 25  ALKPHOS 64  BILITOT 0.9  PROT 6.3*  ALBUMIN 3.2*   No results for input(s): LIPASE, AMYLASE in the last 168 hours. No results for input(s): AMMONIA in the last 168 hours. Coagulation Profile: No results for input(s): INR, PROTIME in the last 168 hours. Cardiac Enzymes: Recent Labs  Lab 10/11/20 2025 10/12/20 0800  CKTOTAL 1,755* 832*   BNP (last 3 results) No results for input(s): PROBNP in the last 8760 hours. HbA1C: No results for input(s): HGBA1C in the last 72 hours. CBG: Recent Labs  Lab  10/09/20 2346 10/10/20 0425 10/10/20 0648 10/12/20 0133 10/12/20 0726  GLUCAP 181* 208* 146* 121* 104*   Lipid Profile: No results for input(s): CHOL, HDL, LDLCALC, TRIG, CHOLHDL, LDLDIRECT in the last 72 hours. Thyroid Function Tests: No results for input(s): TSH, T4TOTAL, FREET4, T3FREE, THYROIDAB in the last 72 hours. Anemia Panel: No results for input(s): VITAMINB12, FOLATE, FERRITIN, TIBC, IRON, RETICCTPCT in the last 72 hours. Sepsis Labs: No results for input(s): PROCALCITON, LATICACIDVEN in the last 168 hours.  Recent Results (from the past 240 hour(s))  Surgical pcr screen     Status: None   Collection Time: 10/07/20  8:13 AM   Specimen: Nasal Mucosa; Nasal Swab  Result Value Ref Range Status   MRSA, PCR NEGATIVE NEGATIVE Final   Staphylococcus aureus NEGATIVE NEGATIVE Final    Comment: (NOTE) The Xpert SA Assay (FDA approved for NASAL specimens in patients 57 years of age and older), is one component of a comprehensive surveillance program. It is not intended to diagnose infection nor to guide or monitor treatment. Performed at [facility], [place] [address] [place], [place] [address]   SARS CORONAVIRUS 2 (TAT 6-24 HRS) Nasopharyngeal Nasopharyngeal Swab     Status: None   Collection Time: 10/07/20  8:54 AM   Specimen: Nasopharyngeal Swab  Result Value Ref Range Status   SARS Coronavirus 2 NEGATIVE NEGATIVE Final    Comment: (NOTE) SARS-CoV-2 target nucleic acids are NOT DETECTED.  The SARS-CoV-2 RNA is generally detectable in upper and lower respiratory specimens during the acute phase of infection. Negative results do not preclude SARS-CoV-2 infection, do not rule out co-infections with other pathogens, and should not be used as the sole basis for treatment or other patient management decisions. Negative results must be combined with clinical observations, patient history, and epidemiological information. The expected result is Negative.  Fact Sheet  for Patients: SugarRoll.be  Fact Sheet for Healthcare Providers: https://www.-mathews.com/  This test is not yet approved or cleared by the [place] FDA and  has been authorized for detection and/or diagnosis of SARS-CoV-2 by FDA under an Emergency Use Authorization (EUA). This EUA will remain  in effect (meaning this test can be used) for the duration of the COVID-19 declaration under Se ction 564(b)(1) of the  Act, 21 U.S.C. section 360bbb-3(b)(1), unless the authorization is terminated or revoked sooner.  Performed at [facility], [place] [address] [place], [place] [address]   SARS CORONAVIRUS 2 (TAT 6-24 HRS) Nasopharyngeal Nasopharyngeal Swab     Status: None   Collection Time: 10/12/20  2:30 AM   Specimen: Nasopharyngeal Swab  Result Value Ref Range Status   SARS Coronavirus 2 NEGATIVE NEGATIVE Final    Comment: (NOTE) SARS-CoV-2 target nucleic acids are NOT DETECTED.  The SARS-CoV-2 RNA is generally detectable in upper and lower respiratory specimens during the acute phase of infection. Negative results do not preclude SARS-CoV-2 infection, do not rule out co-infections with other pathogens, and should not be used as the sole basis for treatment or other patient management decisions. Negative results must be combined with clinical observations, patient history, and epidemiological information. The expected result is Negative.  Fact Sheet for Patients: SugarRoll.be  Fact Sheet for Healthcare Providers: https://www.Jacyln Carmer-mathews.com/  This test is not yet approved or cleared by the [place] FDA and  has been authorized for detection and/or diagnosis of SARS-CoV-2 by FDA under an Emergency Use Authorization (EUA). This EUA will remain  in effect (meaning this test can be used) for the duration of the COVID-19 declaration under Se ction 564(b)(1) of the Act, 21  U.S.C. section 360bbb-3(b)(1), unless the authorization is terminated or revoked sooner.  Performed at [facility], [place] [address], [place], [place] [address]          Radiology Studies: DG Chest 2 View  Result Date: 10/11/2020 CLINICAL DATA:  Tachycardia, back pain EXAM: CHEST - 2 VIEW COMPARISON:  07/24/2018, 10/09/2020, 08/29/2020 FINDINGS: Frontal and lateral views of the chest were obtained. Evaluation is limited by kyphosis and difficulty positioning the patient. The cardiac silhouette is grossly unremarkable. Low lung volumes without airspace disease, effusion, or pneumothorax. Kyphosis centered at the thoracolumbar junction with prominent spondylosis unchanged. Stable spinal stimulator within the thoracic central canal. IMPRESSION: 1. No acute intrathoracic process. Electronically Signed   By: Randa Ngo M.D.   On: 10/11/2020 20:47   CT Lumbar Spine Wo Contrast  Result Date: 10/11/2020 CLINICAL DATA:  Recent lumbar spine surgery. Fall with low back pain. EXAM: CT LUMBAR SPINE WITHOUT CONTRAST TECHNIQUE: Multidetector CT imaging of the lumbar spine was performed without intravenous contrast administration. Multiplanar CT image reconstructions were also generated. COMPARISON:  None. FINDINGS: Segmentation: Standard Alignment: Grade 1 anterolisthesis at L3-4 and L4-5 Vertebrae: No acute fracture. L3-5 PLIF and L5-S1 posterior instrumented fusion. No perihardware lucency. Paraspinal and other soft tissues: Postsurgical changes within the dorsal midline soft tissues with small amount of gas and fluid. Disc levels: Limited assessment of the spinal canal at the postsurgical levels. Neural foramina are patent. No visible spinal canal stenosis. IMPRESSION: 1. No acute fracture or static subluxation of the lumbar spine. 2. L3-5 PLIF and L5-S1 posterior instrumented fusion without evidence of hardware complication. Electronically Signed   By: Ulyses Jarred M.D.   On: 10/11/2020 21:18         Scheduled Meds:  enoxaparin (LOVENOX) injection  40 mg Subcutaneous Q24H   insulin aspart  0-15 Units Subcutaneous TID WC   insulin aspart  0-5 Units Subcutaneous QHS   Continuous Infusions:  sodium chloride 100 mL/hr at 10/12/20 0142     LOS: 0 days    Time spent:05 min    Felita Bump, Geraldo Docker, MD [facility]   If 7PM-7AM, please contact night-coverage 10/12/2020, 9:33 AM

## 2020-10-12 NOTE — Evaluation (Signed)
Physical Therapy Evaluation Patient Details Name: Amanda Morris MRN: [id number] DOB: Apr 26, 1951 Today's Date: 10/12/2020   History of Present Illness  Pt is a 70 y/o female presenting on 6/18 to the ER with back pain after being found down. Of note pt with recent hx of lumbar surgery 6/16 with L5 and S1 decompression and fusion. CXR and CT lumbar showed no acute processes. PMH: insulin-dependent type 2 diabetes, dyslipidemia, hypertension, chronic back pain.  Clinical Impression  Pt presents to PT with deficits in functional mobility, gait, balance, endurance, power, and with significant pain. Pt reports bilateral hip and low back pain, limiting her tolerance for mobility. Pt requires physical assistance to perform all functional mobility at this time, and is unable to ambulate for household distances due to pain. Pt also demonstrates some impaired recall of back precautions. Pt will benefit from acute PT services to improve functional mobility quality and aide in a return to independent mobility. PT recommends SNF placement at the time of discharge as the pt currently requires physical assistance to perform all mobility and has inconsistent caregiver support.    Follow Up Recommendations SNF    Equipment Recommendations  Wheelchair (measurements PT) (if pt decides to D/C home)    Recommendations for Other Services       Precautions / Restrictions Precautions Precautions: Back Precaution Booklet Issued: No Precaution Comments: pt is able to recall 2/3 back precautions and requires visual cues for twisting precautions Restrictions Weight Bearing Restrictions: No      Mobility  Bed Mobility Overal bed mobility: Needs Assistance Bed Mobility: Rolling;Sidelying to Sit;Sit to Supine Rolling: Min assist Sidelying to sit: Min assist   Sit to supine: Mod assist   General bed mobility comments: pt requires assist to roll, elevate trunk into sitting, and for LE management with  attempts to return to supine    Transfers Overall transfer level: Needs assistance Equipment used: 2 person hand held assist Transfers: Sit to/from Stand Sit to Stand: Min assist         General transfer comment: PT hand hold and knee block with egress from high stretcher. Pt unable to reach feet to floor when sitting at edge of stretcher  Ambulation/Gait Ambulation/Gait assistance: Min assist Gait Distance (Feet): 3 Feet Assistive device: 1 person hand held assist Gait Pattern/deviations: Shuffle Gait velocity: reduced Gait velocity interpretation: <1.31 ft/sec, indicative of household ambulator General Gait Details: pt takes multiple sides steps with reduced foot clearance to advance toward head of bed  Stairs            Wheelchair Mobility    Modified Rankin (Stroke Patients Only)       Balance Overall balance assessment: Needs assistance Sitting-balance support: Single extremity supported;Bilateral upper extremity supported;Feet unsupported Sitting balance-Leahy Scale: Poor Sitting balance - Comments: reliant on UE support to maintain balance   Standing balance support: Single extremity supported;Bilateral upper extremity supported Standing balance-Leahy Scale: Poor Standing balance comment: reliant on BUE support for balance                             Pertinent Vitals/Pain Pain Assessment: 0-10 Pain Score: 8  Pain Location: back and hips Pain Descriptors / Indicators: Aching Pain Intervention(s): Limited activity within patient's tolerance    Home Living Family/patient expects to be discharged to:: Private residence Living Arrangements: Alone Available Help at Discharge: Family;Available PRN/intermittently Type of Home: Apartment Home Access: Level entry     Home  Layout: One level Home Equipment: Shower seat - built in;Adaptive equipment;Grab bars - tub/shower;Walker - 2 wheels      Prior Function Level of Independence: Independent          Comments: Retired from working at [employer]. Now enjoys spending time with family; drives     Hand Dominance   Dominant Hand: Right    Extremity/Trunk Assessment   Upper Extremity Assessment Upper Extremity Assessment: Overall WFL for tasks assessed    Lower Extremity Assessment Lower Extremity Assessment: Generalized weakness (3-/5 PF/DF, pain limiting)    Cervical / Trunk Assessment Cervical / Trunk Assessment: Other exceptions Cervical / Trunk Exceptions: s/p Lumbar surgery, drainage noted from lumbar dressing, RN made aware  Communication   Communication: No difficulties  Cognition Arousal/Alertness: Awake/alert Behavior During Therapy: WFL for tasks assessed/performed Overall Cognitive Status: Impaired/Different from baseline Area of Impairment: Memory                     Memory: Decreased recall of precautions                General Comments General comments (skin integrity, edema, etc.): VSS on RA, PT made RN aware of drainage present from incision bandage    Exercises     Assessment/Plan    PT Assessment Patient needs continued PT services  PT Problem List Decreased strength;Decreased activity tolerance;Decreased balance;Decreased mobility;Decreased knowledge of use of DME;Decreased knowledge of precautions;Pain       PT Treatment Interventions DME instruction;Gait training;Functional mobility training;Therapeutic activities;Therapeutic exercise;Balance training;Patient/family education    PT Goals (Current goals can be found in the Care Plan section)  Acute Rehab PT Goals Patient Stated Goal: to improve mobility and return to independence. Also to reduce pain PT Goal Formulation: With patient Time For Goal Achievement: 10/26/20 Potential to Achieve Goals: Good    Frequency Min 3X/week   Barriers to discharge Decreased caregiver support      Co-evaluation               AM-PAC PT "6 Clicks" Mobility  Outcome  Measure Help needed turning from your back to your side while in a flat bed without using bedrails?: A Little Help needed moving from lying on your back to sitting on the side of a flat bed without using bedrails?: A Little Help needed moving to and from a bed to a chair (including a wheelchair)?: A Little Help needed standing up from a chair using your arms (e.g., wheelchair or bedside chair)?: A Little Help needed to walk in hospital room?: A Little Help needed climbing 3-5 steps with a railing? : A Lot 6 Click Score: 17    End of Session   Activity Tolerance: Patient limited by pain Patient left: in bed;with call bell/phone within reach Nurse Communication: Mobility status PT Visit Diagnosis: Other abnormalities of gait and mobility (R26.89);Pain;Unsteadiness on feet (R26.81);Muscle weakness (generalized) (M62.81);History of falling (Z91.81) Pain - part of body: Hip (back)    Time: 3790-2409 PT Time Calculation (min) (ACUTE ONLY): 20 min   Charges:   PT Evaluation $PT Eval Low Complexity: 1 Low          Zenaida Niece, PT, DPT Acute Rehabilitation Pager: [phone]   Zenaida Niece 10/12/2020, 4:27 PM

## 2020-10-12 NOTE — Consult Note (Signed)
Neurosurgery Consultation  Reason for Consult: Recent surgery, fall w/ low back pain Referring Physician: Horton  CC: Back pain  HPI: This is a 70 y.o. woman that presents after a fall and being down for a prolonged period. She recently had extension of decompression / instrumented fusion by Dr. Christella Noa on 6/16, d/c'd on 6/17. She did well at home but fell on the floor in her living room and couldn't get up. Was eventually found by family, but she continued to have severe back pain so she came to the ED. The pain is located in her bilateral hips more than back, not extending past the knees, no new numbness or paresthesias. No recent change in bowel or bladder function. No drainage from the wound.   ROS: A 14 point ROS was performed and is negative except as noted in the HPI.   PMHx:  Past Medical History:  Diagnosis Date   Allergy    Diabetes mellitus without complication (HCC)    type 2   GERD (gastroesophageal reflux disease)    Headache    History of hiatal hernia    Hypercholesterolemia    Numbness    left, outer thigh   Palpitations    in the past, no current issues per patient   Postlaminectomy syndrome    FamHx:  Family History  Problem Relation Age of Onset   Heart disease Mother    Diabetes Mother    Hypertension Mother    Stroke Mother    Heart disease Father    Heart disease Sister    Diabetes Sister    Hyperlipidemia Sister    Hypertension Sister    Stroke Sister    Alcohol abuse Brother    Hyperlipidemia Brother    SocHx:  reports that she quit smoking about 13 years ago. Her smoking use included cigarettes. She has never used smokeless tobacco. She reports that she does not drink alcohol and does not use drugs.  Exam: Vital signs in last 24 hours: Temp:  [98.6 F (37 C)] 98.6 F (37 C) (06/18 1937) Pulse Rate:  [91-123] 98 (06/19 0800) Resp:  [16-18] 16 (06/19 0800) BP: (100-123)/(59-70) 104/63 (06/19 0800) SpO2:  [96 %-98 %] 97 % (06/19  0800) Weight:  [100 kg] 100 kg (06/18 1937) General: Awake, alert, cooperative, lying in bed in NAD Head: Normocephalic and atruamatic HEENT: Neck supple Pulmonary: breathing room air comfortably, no evidence of increased work of breathing Cardiac: RRR Abdomen: obese S NT ND Extremities: Warm and well perfused x4 Neuro:  Strength 5/5 x4 except pain limited at hip flexion bilaterally, SILTx4, very tender to palpation over the right and mildly on the left greater trochanters with +FABER on the R>L Incision c/d/i   Assessment and Plan: 70 y.o. woman s/p lumbar decompression and fusion, fall at home and down for prolonged period, +rhabdomyolysis on lab work with expected leukocytosis from surgery. CT L-spine personally reviewed, which shows hardware in good position without complicating feature. On exam, no evidence of lumbar radiculopathy, the majority of her pain appears to be from the right hip.   -will order a right hip XR given her severe tenderness, CT and exam are reassuring -will continue to follow, can have activity and diet as tolerated from a spine standpoint -please call with any concerns or questions  Judith Part, MD 10/12/20 9:03 AM [facility]

## 2020-10-13 DIAGNOSIS — G8918 Other acute postprocedural pain: Principal | ICD-10-CM

## 2020-10-13 DIAGNOSIS — M545 Low back pain, unspecified: Secondary | ICD-10-CM | POA: Diagnosis not present

## 2020-10-13 DIAGNOSIS — M25559 Pain in unspecified hip: Secondary | ICD-10-CM

## 2020-10-13 DIAGNOSIS — M5441 Lumbago with sciatica, right side: Secondary | ICD-10-CM

## 2020-10-13 DIAGNOSIS — M4800 Spinal stenosis, site unspecified: Secondary | ICD-10-CM

## 2020-10-13 DIAGNOSIS — R748 Abnormal levels of other serum enzymes: Secondary | ICD-10-CM | POA: Diagnosis not present

## 2020-10-13 DIAGNOSIS — I10 Essential (primary) hypertension: Secondary | ICD-10-CM

## 2020-10-13 DIAGNOSIS — E785 Hyperlipidemia, unspecified: Secondary | ICD-10-CM

## 2020-10-13 DIAGNOSIS — E860 Dehydration: Secondary | ICD-10-CM | POA: Diagnosis not present

## 2020-10-13 DIAGNOSIS — E669 Obesity, unspecified: Secondary | ICD-10-CM

## 2020-10-13 LAB — CBC WITH DIFFERENTIAL/PLATELET
Abs Immature Granulocytes: 0.04 10*3/uL (ref 0.00–0.07)
Basophils Absolute: 0.1 10*3/uL (ref 0.0–0.1)
Basophils Relative: 1 %
Eosinophils Absolute: 0.4 10*3/uL (ref 0.0–0.5)
Eosinophils Relative: 3 %
HCT: 32.5 % — ABNORMAL LOW (ref 36.0–46.0)
Hemoglobin: 9.8 g/dL — ABNORMAL LOW (ref 12.0–15.0)
Immature Granulocytes: 0 %
Lymphocytes Relative: 29 %
Lymphs Abs: 3 10*3/uL (ref 0.7–4.0)
MCH: 22.7 pg — ABNORMAL LOW (ref 26.0–34.0)
MCHC: 30.2 g/dL (ref 30.0–36.0)
MCV: 75.4 fL — ABNORMAL LOW (ref 80.0–100.0)
Monocytes Absolute: 1.1 10*3/uL — ABNORMAL HIGH (ref 0.1–1.0)
Monocytes Relative: 11 %
Neutro Abs: 5.8 10*3/uL (ref 1.7–7.7)
Neutrophils Relative %: 56 %
Platelets: 193 10*3/uL (ref 150–400)
RBC: 4.31 MIL/uL (ref 3.87–5.11)
RDW: 18 % — ABNORMAL HIGH (ref 11.5–15.5)
WBC: 10.4 10*3/uL (ref 4.0–10.5)
nRBC: 0 % (ref 0.0–0.2)

## 2020-10-13 LAB — COMPREHENSIVE METABOLIC PANEL
ALT: 25 U/L (ref 0–44)
AST: 35 U/L (ref 15–41)
Albumin: 2.7 g/dL — ABNORMAL LOW (ref 3.5–5.0)
Alkaline Phosphatase: 58 U/L (ref 38–126)
Anion gap: 7 (ref 5–15)
BUN: 12 mg/dL (ref 8–23)
CO2: 23 mmol/L (ref 22–32)
Calcium: 8 mg/dL — ABNORMAL LOW (ref 8.9–10.3)
Chloride: 113 mmol/L — ABNORMAL HIGH (ref 98–111)
Creatinine, Ser: 0.6 mg/dL (ref 0.44–1.00)
GFR, Estimated: >60 mL/min (ref >60)
Glucose, Bld: 89 mg/dL (ref 70–99)
Potassium: 3.8 mmol/L (ref 3.5–5.1)
Sodium: 143 mmol/L (ref 135–145)
Total Bilirubin: 1 mg/dL (ref 0.3–1.2)
Total Protein: 6.1 g/dL — ABNORMAL LOW (ref 6.5–8.1)

## 2020-10-13 LAB — URINE CULTURE: Culture: <10000 — AB

## 2020-10-13 LAB — LIPID PANEL
Cholesterol: 122 mg/dL (ref 0–200)
Cholesterol: 127 mg/dL (ref 0–200)
HDL: 34 mg/dL — ABNORMAL LOW (ref >40)
HDL: 32 mg/dL — ABNORMAL LOW (ref >40)
LDL Cholesterol: 70 mg/dL (ref 0–99)
LDL Cholesterol: 75 mg/dL (ref 0–99)
Total CHOL/HDL Ratio: 3.6 RATIO
Total CHOL/HDL Ratio: 4 RATIO
Triglycerides: 90 mg/dL (ref <150)
Triglycerides: 99 mg/dL (ref <150)
VLDL: 18 mg/dL (ref 0–40)
VLDL: 20 mg/dL (ref 0–40)

## 2020-10-13 LAB — GLUCOSE, CAPILLARY
Glucose-Capillary: 90 mg/dL (ref 70–99)
Glucose-Capillary: 81 mg/dL (ref 70–99)
Glucose-Capillary: 90 mg/dL (ref 70–99)
Glucose-Capillary: 130 mg/dL — ABNORMAL HIGH (ref 70–99)

## 2020-10-13 LAB — CK: Total CK: 873 U/L — ABNORMAL HIGH (ref 38–234)

## 2020-10-13 LAB — PHOSPHORUS: Phosphorus: 2.6 mg/dL (ref 2.5–4.6)

## 2020-10-13 LAB — MAGNESIUM: Magnesium: 2.1 mg/dL (ref 1.7–2.4)

## 2020-10-13 MED ORDER — OXYCODONE HCL ER 10 MG PO T12A
10.0000 mg | EXTENDED_RELEASE_TABLET | Freq: Two times a day (BID) | ORAL | Status: DC
  Filled 2020-10-13 (×2): qty 1

## 2020-10-13 MED ORDER — DEXTROSE-NACL 5-0.9 % IV SOLN: INTRAVENOUS | Status: DC

## 2020-10-13 MED ORDER — PREGABALIN 100 MG PO CAPS
200.0000 mg | ORAL_CAPSULE | Freq: Three times a day (TID) | ORAL | Status: DC
  Administered 2020-10-13 – 2020-10-18 (×14): 200 mg via ORAL
  Filled 2020-10-13 (×5): qty 2
  Filled 2020-10-13: qty 8
  Filled 2020-10-13 (×8): qty 2

## 2020-10-13 MED ORDER — SODIUM CHLORIDE 0.45 % IV SOLN: INTRAVENOUS | Status: DC

## 2020-10-13 NOTE — TOC Initial Note (Addendum)
Transition of Care [facility]) - Initial/Assessment Note    Patient Details  Name: Amanda Morris MRN: [id number] Date of Birth: 31-Oct-1950  Transition of Care [facility]) CM/SW Contact:    Gabrielle Dare Phone Number: 10/13/2020, 3:20 PM  Clinical Narrative:                 CSW received a consult for possible SNF placement.  CSW spoke with pt concerning possible SNF placement. Pt is agreeable for SNF placement.  Pt lives along in a one story apartment. Pt would like for CSW to contact nieces to give them medicare website information for SNF's in [facility].  Pt has been vaccinated and has received d1 booster.TOC will continue to assist with disposition planning.  4:13pm : Spoke with one niece [name].  Pt's niece will review SNF's for [employer] on [employer].gov and return phone call to [place] on Tuesday.  Pt's niece will also discuss with other niece Darl Pikes.  Expected Discharge Plan: Skilled Nursing Facility Barriers to Discharge: Continued Medical Work up, SNF Pending bed offer   Patient Goals and CMS Choice   CMS Medicare.gov Compare Post Acute Care list provided to:: Other (Comment Required) (Given to pt's nieces per pt) Choice offered to / list presented to : Patient  Expected Discharge Plan and Services Expected Discharge Plan: [place] In-house Referral: Clinical Social Work     Living arrangements for the past 2 months: Apartment                                      Prior Living Arrangements/Services Living arrangements for the past 2 months: Apartment Lives with:: Self Patient language and need for interpreter reviewed:: Yes Do you feel safe going back to the place where you live?: Yes      Need for Family Participation in Patient Care: Yes (Comment) Care giver support system in place?: Yes (comment)   Criminal Activity/Legal Involvement Pertinent to Current Situation/Hospitalization: No - Comment as needed  Activities of Daily  Living Home Assistive Devices/Equipment: Dentures (specify type) ADL Screening (condition at time of admission) Patient's cognitive ability adequate to safely complete daily activities?: Yes Is the patient deaf or have difficulty hearing?: No Does the patient have difficulty seeing, even when wearing glasses/contacts?: No Does the patient have difficulty concentrating, remembering, or making decisions?: No Patient able to express need for assistance with ADLs?: Yes Does the patient have difficulty dressing or bathing?: Yes Independently performs ADLs?: No Communication: Independent Dressing (OT): Needs assistance Is this a change from baseline?: Change from baseline, expected to last <3days Grooming: Independent Feeding: Independent Bathing: Needs assistance Is this a change from baseline?: Change from baseline, expected to last <3 days Toileting: Needs assistance Is this a change from baseline?: Change from baseline, expected to last <3 days In/Out Bed: Needs assistance Is this a change from baseline?: Change from baseline, expected to last <3 days Walks in Home: Independent Does the patient have difficulty walking or climbing stairs?: Yes Weakness of Legs: Both Weakness of Arms/Hands: Both  Permission Sought/Granted Permission sought to share information with : Family Supports Permission granted to share information with : Yes, Verbal Permission Granted  Share Information with NAME: [place] granted to share info w AGENCY: yes  Permission granted to share info w Relationship: Nieces  Permission granted to share info w Contact Information: yes  Emotional Assessment Appearance:: Appears older than stated age Attitude/Demeanor/Rapport: Engaged, Gracious Affect (typically observed): Appropriate Orientation: : Oriented to Self, Oriented to Place, Oriented to  Time, Oriented to Situation Alcohol / Substance Use: Not Applicable Psych Involvement: No  (comment)  Admission diagnosis:  Dehydration [E86.0] Hip pain [M25.559] Fall [W19.XXXA] Post-op pain [G89.18] Non-traumatic rhabdomyolysis [M62.82] Acute bilateral low back pain without sciatica [M54.50] Patient Active Problem List   Diagnosis Date Noted   Fall 10/12/2020   Elevated CK 10/12/2020   Spinal stenosis 10/09/2020   Lumbar adjacent segment disease with spondylolisthesis 10/09/2020   Spinal stenosis, lumbar region with neurogenic claudication 04/16/2020   Peripheral neuropathy 11/28/2019   Paresthesia and pain of both upper extremities 11/12/2019   Chronic midline low back pain with bilateral sciatica 10/30/2019   Left thigh pain 10/30/2019   Numbness 10/30/2019   Meralgia paresthetica, left 09/12/2019   T[place] Must Quervain's disease (tenosynovitis) 02/06/2019   Acute bilateral low back pain without sciatica 12/01/2018   Orthostatic hypotension 12/01/2018   Failed back syndrome of lumbar spine 11/25/2017   Fatty liver 09/27/2016   Status post bilateral breast reduction 03/23/2016   Spondylolisthesis of lumbar region 06/13/2015   History of lumbar fusion 04/07/2015   Radiculopathy, lumbar region 04/07/2015   Essential hypertension 02/14/2015   Postprandial vomiting 02/04/2014   Obesity (BMI 30-39.9) 11/01/2013   Carotid artery stenosis 06/07/2013   Hereditary and idiopathic peripheral neuropathy 06/07/2013   History of migraine 05/04/2013   Type 2 diabetes mellitus with other specified complication ([place]) 96/28/3662   SVT (supraventricular tachycardia) ([place]) 11/03/2010   GERD (gastroesophageal reflux disease) 10/29/2010   Chronic low back pain 10/29/2010   Hyperlipidemia associated with type 2 diabetes mellitus ([place]) 10/29/2010   PCP:  Hali Marry, MD Pharmacy:   [id number] - [address], [place] [address] Phone: [phone] Fax: [phone]     Social Determinants  of Health (SDOH) Interventions    Readmission Risk Interventions No flowsheet data found.

## 2020-10-13 NOTE — Progress Notes (Signed)
PROGRESS NOTE    Amanda Morris  UVO:[id number] DOB: [birth year] DOA: 10/11/2020 PCP: Hali Marry, MD     Brief Narrative:  70 y.o. BF PMHx insulin-dependent type 2 diabetes, dyslipidemia, HTN, chronic back pain   Presented to the ER on 10/11/2020 with back pain and being found on the ground for some period of time with recent history of lumbar surgery on 10/09/2020.   Patient was hospitalized for elective surgery on her back on 10/09/2020.  She had L5 and S1 decompressed and rods placed.  Apparently she tolerated the procedure well and she was discharged home the following day, 10/10/2020.  She then said she went home and while in the living room she dropped to the ground due to lower extremity weakness.  She did not hit her head and did not lose consciousness.  She says she has a walker at home but was not using.  She laid on the ground and could not get up.  She does not live with anyone at home.  Then sometime later, she had family check on her they found her on the ground.  They were able to get her up and she was able to ambulate some, but due to some worsening back pain, she came to the ED for evaluation.  She has not eaten or drinking or taking her medications due to being on the ground for extended period time.       ED course: -Vitals on admission: Afebrile, heart rate 123, respiratory rate 18, blood pressure 123/70, maintaining sats on room air -Labs on initial presentation: Sodium 136, potassium 3.3, chloride 104, bicarb 22, glucose 166, BUN 11, creatinine 0.77, calcium 8.3, albumin 3.2, WBC 17.2, hemoglobin 10.8, CK 1700 -Imaging obtained on admission:  Chest x-ray showed no acute processes.  CT lumbar spine showed no acute processes and findings consistent with recent surgery. -In the ED the patient was given 1 L of IV fluids, Zofran, morphine, Tylenol.  Neurosurgery was contacted by the ER, recommended admission to the hospital service for hydration, no  advanced/further imaging recommended, and the hospitalist service was contacted for further evaluation and management.   Subjective: 6/20 afebrile overnight, A/O x4, states when she fell off couch she laid on floor> 24 hours until her niece found her.    Assessment & Plan: Covid vaccination; vaccinated 3/4   Active Problems:   Chronic low back pain   Hyperlipidemia associated with type 2 diabetes mellitus ([place])   Type 2 diabetes mellitus with other specified complication ([facility])   Obesity (BMI 30-39.9)   Essential hypertension   Spinal stenosis   Fall   Elevated CK  S/P L5-S1 with decompression and lumbar fusion -Surgery was performed on 10/09/2020 and was discharged on 10/10/2020 -Lumbar CT on admission showed no acute processes.  No further imaging recommended by neurosurgery - Neurosurgery contacted by the ER, will evaluate patient, but recommended admission to the hospital service - 6/20 pain uncontrolled:  -OxyContin 12 mg BID  -Norco PRN  -Lyrica 200 mg TID -6/20 PT/OT consult; while patient in hospital were daily with patient on range of motion and strengthening    S/P ground-level fall/ -Shortly after returning home, patient had a ground-level fall due to lower extremity weakness.  Denies hitting her head or loss of consciousness - Fall precautions - PT consulted   Elevated CK/Rhabdomyolysis -On admission CK 1700 -UA did not show any hematuria and no AKI seen on admission - 1 L of IV fluids ordered in the  ER, will continue maintenance IV fluids afterwards Lab Results  Component Value Date   HWEXHBZ 169 (H) 10/13/2020   CKTOTAL 832 (H) 10/12/2020   CKTOTAL 1,755 (H) 10/11/2020   CKTOTAL 141 11/28/2019  -6/20 D5-0.9% saline 182ml/hr  DM type II controlled without complication - ADA diet -6/20 Hemoglobin A1c pending - 6/20 lipid panel pending -6/20 moderate SSI   Dyslipidemia -Hold home statin for now given elevated CK -Lipid panel pending   Obese (BMI  37.84 kg/m) -BMI 38 - Recommend low calorie diet, exercise, weight loss    Hypokalemia - Potassium goal> 4 - K-Dur 40 mEq x 2 doses      DVT prophylaxis: Lovenox Code Status: Full Family Communication:  Status is: Inpatient    Dispo: The patient is from: Home              Anticipated d/c is to: Home              Anticipated d/c date is: 6/23              Patient currently unstable      Consultants:    Procedures/Significant Events:  6/19 DG hip unilateral W or W0 pelvis 1V RIGHT; 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 Lower lumbosacral spine fusion. Battery apparatus overlies the right ilium  I have personally reviewed and interpreted all radiology studies and my findings are as above.  VENTILATOR SETTINGS:    Cultures 6/19 SARS coronavirus negative   Antimicrobials:    Devices    LINES / TUBES:      Continuous Infusions:     Objective: Vitals:   10/12/20 1630 10/12/20 2026 10/13/20 0755 10/13/20 1545  BP: (!) 94/55 124/70 109/65 123/74  Pulse: 88 91 89 98  Resp: 17 16 17 17   Temp: 98.9 F (37.2 C) 99.1 F (37.3 C) 98.5 F (36.9 C) 98.9 F (37.2 C)  TempSrc: Oral Oral Oral   SpO2: 93% 96% 95% 100%  Weight:      Height:        Intake/Output Summary (Last 24 hours) at 10/13/2020 1709 Last data filed at 10/13/2020 1500 Gross per 24 hour  Intake 1380 ml  Output --  Net 1380 ml    Filed Weights   10/11/20 1937  Weight: 100 kg    Examination:  General: No acute respiratory distress Eyes: negative scleral hemorrhage, negative anisocoria, negative icterus ENT: Negative Runny nose, negative gingival bleeding, Neck:  Negative scars, masses, torticollis, lymphadenopathy, JVD Lungs: Clear to auscultation bilaterally without wheezes or crackles Cardiovascular: Regular rate and rhythm without murmur gallop or rub normal S1 and S2 Abdomen: negative abdominal pain, nondistended, positive soft, bowel sounds, no rebound, no  ascites, no appreciable mass Extremities: No significant cyanosis, clubbing, or edema bilateral lower extremities Skin: Negative rashes, lesions, ulcers Psychiatric:  Negative depression, negative anxiety, negative fatigue, negative mania  Central nervous system:  Cranial nerves II through XII intact, tongue/uvula midline, all extremities muscle strength 5/5, sensation intact throughout, finger nose finger bilateral within normal limits, quick finger touch bilateral within normal limits, negative Romberg sign, heel to shin bilateral within normal limits, standing on 1 foot bilateral within normal limits, walking on tiptoes within normal limits, walking on heels within normal limits, negative dysarthria, negative expressive aphasia, negative receptive aphasia.  .     Data Reviewed: Care during the described time interval was provided by me .  I have reviewed this patient's available data, including medical history, events of note, physical  examination, and all test results as part of my evaluation.  CBC: Recent Labs  Lab 10/07/20 0900 10/09/20 1606 10/11/20 2025 10/13/20 0257  WBC 9.6  --  17.2* 10.4  NEUTROABS  --   --  11.9* 5.8  HGB 12.8 12.6 10.8* 9.8*  HCT 42.5 37.0 35.5* 32.5*  MCV 75.4*  --  74.6* 75.4*  PLT 266  --  244 536    Basic Metabolic Panel: Recent Labs  Lab 10/07/20 0900 10/09/20 1606 10/11/20 2025 10/12/20 0025 10/12/20 0800 10/13/20 0257  NA 137 145 136  --  141 143  K 3.8 3.7 3.3*  --  3.3* 3.8  CL 106 107 104  --  111 113*  CO2 24  --  22  --  22 23  GLUCOSE 199* 88 166*  --  94 89  BUN 7* 9 11  --  12 12  CREATININE 0.81 0.60 0.77  --  0.63 0.60  CALCIUM 8.6*  --  8.3*  --  7.8* 8.0*  MG  --   --   --  1.9  --  2.1  PHOS  --   --   --  2.6  --  2.6    GFR: Estimated Creatinine Clearance: 77.4 mL/min (by C-G formula based on SCr of 0.6 mg/dL). Liver Function Tests: Recent Labs  Lab 10/11/20 2025 10/13/20 0257  AST 51* 35  ALT 25 25  ALKPHOS  64 58  BILITOT 0.9 1.0  PROT 6.3* 6.1*  ALBUMIN 3.2* 2.7*    No results for input(s): LIPASE, AMYLASE in the last 168 hours. No results for input(s): AMMONIA in the last 168 hours. Coagulation Profile: No results for input(s): INR, PROTIME in the last 168 hours. Cardiac Enzymes: Recent Labs  Lab 10/11/20 2025 10/12/20 0800 10/13/20 0257  CKTOTAL 1,755* 832* 873*    BNP (last 3 results) No results for input(s): PROBNP in the last 8760 hours. HbA1C: No results for input(s): HGBA1C in the last 72 hours. CBG: Recent Labs  Lab 10/12/20 1705 10/12/20 2115 10/13/20 0629 10/13/20 1148 10/13/20 1650  GLUCAP 95 85 90 81 90    Lipid Profile: Recent Labs    10/13/20 0257  CHOL 122  HDL 34*  LDLCALC 70  TRIG 90  CHOLHDL 3.6   Thyroid Function Tests: No results for input(s): TSH, T4TOTAL, FREET4, T3FREE, THYROIDAB in the last 72 hours. Anemia Panel: No results for input(s): VITAMINB12, FOLATE, FERRITIN, TIBC, IRON, RETICCTPCT in the last 72 hours. Sepsis Labs: No results for input(s): PROCALCITON, LATICACIDVEN in the last 168 hours.  Recent Results (from the past 240 hour(s))  Surgical pcr screen     Status: None   Collection Time: 10/07/20  8:13 AM   Specimen: Nasal Mucosa; Nasal Swab  Result Value Ref Range Status   MRSA, PCR NEGATIVE NEGATIVE Final   Staphylococcus aureus NEGATIVE NEGATIVE Final    Comment: (NOTE) The Xpert SA Assay (FDA approved for NASAL specimens in patients 88 years of age and older), is one component of a comprehensive surveillance program. It is not intended to diagnose infection nor to guide or monitor treatment. Performed at [facility], [place] [address] [place], [place] [address]   SARS CORONAVIRUS 2 (TAT 6-24 HRS) Nasopharyngeal Nasopharyngeal Swab     Status: None   Collection Time: 10/07/20  8:54 AM   Specimen: Nasopharyngeal Swab  Result Value Ref Range Status   SARS Coronavirus 2 NEGATIVE NEGATIVE Final    Comment:  (  NOTE) SARS-CoV-2 target nucleic acids are NOT DETECTED.  The SARS-CoV-2 RNA is generally detectable in upper and lower respiratory specimens during the acute phase of infection. Negative results do not preclude SARS-CoV-2 infection, do not rule out co-infections with other pathogens, and should not be used as the sole basis for treatment or other patient management decisions. Negative results must be combined with clinical observations, patient history, and epidemiological information. The expected result is Negative.  Fact Sheet for Patients: SugarRoll.be  Fact Sheet for Healthcare Providers: https://www.-mathews.com/  This test is not yet approved or cleared by the [place] FDA and  has been authorized for detection and/or diagnosis of SARS-CoV-2 by FDA under an Emergency Use Authorization (EUA). This EUA will remain  in effect (meaning this test can be used) for the duration of the COVID-19 declaration under Se ction 564(b)(1) of the Act, 21 U.S.C. section 360bbb-3(b)(1), unless the authorization is terminated or revoked sooner.  Performed at [facility], [place] [address], [place], [place] [address]   Urine culture     Status: Abnormal   Collection Time: 10/11/20  9:10 PM   Specimen: Urine, Random  Result Value Ref Range Status   Specimen Description URINE, RANDOM  Final   Special Requests NONE  Final   Culture (A)  Final    <10,000 COLONIES/mL INSIGNIFICANT GROWTH Performed at [facility], [place] [address] [place], [place] [address]    Report Status 10/13/2020 FINAL  Final  SARS CORONAVIRUS 2 (TAT 6-24 HRS) Nasopharyngeal Nasopharyngeal Swab     Status: None   Collection Time: 10/12/20  2:30 AM   Specimen: Nasopharyngeal Swab  Result Value Ref Range Status   SARS Coronavirus 2 NEGATIVE NEGATIVE Final    Comment: (NOTE) SARS-CoV-2 target nucleic acids are NOT DETECTED.  The SARS-CoV-2 RNA is generally  detectable in upper and lower respiratory specimens during the acute phase of infection. Negative results do not preclude SARS-CoV-2 infection, do not rule out co-infections with other pathogens, and should not be used as the sole basis for treatment or other patient management decisions. Negative results must be combined with clinical observations, patient history, and epidemiological information. The expected result is Negative.  Fact Sheet for Patients: SugarRoll.be  Fact Sheet for Healthcare Providers: https://www.-mathews.com/  This test is not yet approved or cleared by the [place] FDA and  has been authorized for detection and/or diagnosis of SARS-CoV-2 by FDA under an Emergency Use Authorization (EUA). This EUA will remain  in effect (meaning this test can be used) for the duration of the COVID-19 declaration under Se ction 564(b)(1) of the Act, 21 U.S.C. section 360bbb-3(b)(1), unless the authorization is terminated or revoked sooner.  Performed at [facility], [place] [address] [place], [place] [address]           Radiology Studies: DG Chest 2 View  Result Date: 10/11/2020 CLINICAL DATA:  Tachycardia, back pain EXAM: CHEST - 2 VIEW COMPARISON:  07/24/2018, 10/09/2020, 08/29/2020 FINDINGS: Frontal and lateral views of the chest were obtained. Evaluation is limited by kyphosis and difficulty positioning the patient. The cardiac silhouette is grossly unremarkable. Low lung volumes without airspace disease, effusion, or pneumothorax. Kyphosis centered at the thoracolumbar junction with prominent spondylosis unchanged. Stable spinal stimulator within the thoracic central canal. IMPRESSION: 1. No acute intrathoracic process. Electronically Signed   By: Randa Ngo M.D.   On: 10/11/2020 20:47   CT Lumbar Spine Wo Contrast  Result Date: 10/11/2020 CLINICAL DATA:  Recent lumbar spine surgery. Fall with low back pain.  EXAM: CT LUMBAR SPINE WITHOUT CONTRAST TECHNIQUE: Multidetector CT imaging of the lumbar spine was performed without intravenous contrast administration. Multiplanar CT image reconstructions were also generated. COMPARISON:  None. FINDINGS: Segmentation: Standard Alignment: Grade 1 anterolisthesis at L3-4 and L4-5 Vertebrae: No acute fracture. L3-5 PLIF and L5-S1 posterior instrumented fusion. No perihardware lucency. Paraspinal and other soft tissues: Postsurgical changes within the dorsal midline soft tissues with small amount of gas and fluid. Disc levels: Limited assessment of the spinal canal at the postsurgical levels. Neural foramina are patent. No visible spinal canal stenosis. IMPRESSION: 1. No acute fracture or static subluxation of the lumbar spine. 2. L3-5 PLIF and L5-S1 posterior instrumented fusion without evidence of hardware complication. Electronically Signed   By: Ulyses Jarred M.D.   On: 10/11/2020 21:18   DG HIP UNILAT WITH PELVIS 1V RIGHT  Result Date: 10/12/2020 CLINICAL DATA:  Hip pain. EXAM: DG HIP (WITH OR WITHOUT PELVIS) 1V RIGHT COMPARISON:  None. FINDINGS: There is no evidence of hip fracture or dislocation. 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 Lower lumbosacral spine fusion. Battery apparatus overlies the right ilium. 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 No evidence of right hip fracture. Electronically Signed   By: Fidela Salisbury M.D.   On: 10/12/2020 11:14        Scheduled Meds:  enoxaparin (LOVENOX) injection  40 mg Subcutaneous Q24H   insulin aspart  0-15 Units Subcutaneous TID WC   insulin aspart  0-5 Units Subcutaneous QHS   Continuous Infusions:     LOS: 0 days    Time spent:05 min    Rosia Syme, Geraldo Docker, MD [facility]   If 7PM-7AM, please contact night-coverage 10/13/2020, 5:09 PM

## 2020-10-13 NOTE — Progress Notes (Signed)
Patient ID: Amanda Morris, female   DOB: Aug 31, 1950, 70 y.o.   MRN: [id number] BP 123/74 (BP Location: Right Arm)   Pulse 98   Temp 98.9 F (37.2 C)   Resp 17   Ht 5\' 4"  (1.626 m)   Wt 100 kg   LMP  (LMP Unknown)   SpO2 100%   BMI 37.84 kg/m  Alert and oriented x 4, speech is clear and fluent Moving extremities well, will need pt/ot Wound is clean and dry.

## 2020-10-13 NOTE — Op Note (Signed)
10/09/2020  7:33 PM  PATIENT:  Amanda Morris  69 y.o. female with stenosis at L5/S1  PRE-OPERATIVE DIAGNOSIS:  Spondylolisthesis, Lumbar 5 - Sacral one  POST-OPERATIVE DIAGNOSIS:  Spondylolisthesis, Lumbar 5 - Sacral one  PROCEDURE:  Procedure(s): LUMBAR FIVE-SACRAL ONE DECOMPRESSION WITH PLACEMENT OF SCREWS AND RODS EXTENDING EXISTING LUMBAR FUSION  SURGEON:  Surgeon(s): Ashok Pall, MD Consuella Lose, MD  ASSISTANTS:Nundkumar  ANESTHESIA:   general  EBL:  No intake/output data recorded.  BLOOD ADMINISTERED:none  CELL SAVER GIVEN:none  COUNT:per nursing  DRAINS: none   SPECIMEN:  No Specimen  DICTATION: [employer] Null is a 70 y.o. female whom was taken to the operating room intubated, and placed under a general anesthetic without difficulty. A foley catheter was placed under sterile conditions. She was positioned prone on a Jackson table with all pressure points properly padded.  Her lumbar region was prepped and draped in a sterile manner. I infiltrated 20cc's 1/2%lidocaine/1:2000,000 strength epinephrine into the planned incision. I opened the skin with a 10 blade and took the incision down to the thoracolumbar fascia. I exposed the lamina of L5, and S1 in a subperiosteal fashion bilaterally. I confirmed my location with an intraoperative xray.  I placed self retaining retractors and started the decompression.  I decompressed the spinal canal at L5/s1 with a complete L5 laminectomy, and L5 inferior facetectomies bilaterally. The lateral recesses and the foramina were opened allowing for the L5 roots free egress from the spinal canal. The disc space looked very good thus no plif performed.  I decorticated the lateral bone at L5, and S1. I then placed allograft and autograft morsels on the decorticated surfaces to complete the posterolateral arthrodesis.  I placed pedicle screws at S1, using fluoroscopic guidance. I drilled a pilot hole, then cannulated the  pedicle with a drill at each site. I then tapped each pedicle, assessing each site for pedicle violations. No cutouts were appreciated. Screws (nuvasive relign) were then placed at each site without difficulty. I attached connectors, rods and locking caps with the appropriate tools. The locking caps were secured with torque limited screwdrivers. Final films were performed and the final construct appeared to be in good position.  I closed the wound in a layered fashion. I approximated the thoracolumbar fascia, subcutaneous, and subcuticular planes with vicryl sutures. I used dermabond for a sterile dressing.     PLAN OF CARE: Admit to inpatient   PATIENT DISPOSITION:  PACU - hemodynamically stable.   Delay start of Pharmacological VTE agent (>24hrs) due to surgical blood loss or risk of bleeding:  yes

## 2020-10-13 NOTE — Plan of Care (Signed)
  Problem: Education: Goal: Knowledge of General Education information will improve Description: Including pain rating scale, medication(s)/side effects and non-pharmacologic comfort measures Outcome: Progressing   Problem: Activity: Goal: Risk for activity intolerance will decrease Outcome: Progressing   Problem: Pain Managment: Goal: General experience of comfort will improve Outcome: Progressing   

## 2020-10-14 DIAGNOSIS — R059 Cough, unspecified: Secondary | ICD-10-CM | POA: Diagnosis not present

## 2020-10-14 DIAGNOSIS — I10 Essential (primary) hypertension: Secondary | ICD-10-CM | POA: Diagnosis not present

## 2020-10-14 DIAGNOSIS — M545 Low back pain, unspecified: Secondary | ICD-10-CM | POA: Diagnosis not present

## 2020-10-14 DIAGNOSIS — E7800 Pure hypercholesterolemia, unspecified: Secondary | ICD-10-CM | POA: Diagnosis present

## 2020-10-14 DIAGNOSIS — M48062 Spinal stenosis, lumbar region with neurogenic claudication: Secondary | ICD-10-CM | POA: Diagnosis present

## 2020-10-14 DIAGNOSIS — Z981 Arthrodesis status: Secondary | ICD-10-CM | POA: Diagnosis not present

## 2020-10-14 DIAGNOSIS — M549 Dorsalgia, unspecified: Secondary | ICD-10-CM | POA: Diagnosis not present

## 2020-10-14 DIAGNOSIS — R918 Other nonspecific abnormal finding of lung field: Secondary | ICD-10-CM | POA: Diagnosis not present

## 2020-10-14 DIAGNOSIS — E669 Obesity, unspecified: Secondary | ICD-10-CM | POA: Diagnosis present

## 2020-10-14 DIAGNOSIS — Z7401 Bed confinement status: Secondary | ICD-10-CM | POA: Diagnosis not present

## 2020-10-14 DIAGNOSIS — Z9682 Presence of neurostimulator: Secondary | ICD-10-CM | POA: Diagnosis not present

## 2020-10-14 DIAGNOSIS — G8929 Other chronic pain: Secondary | ICD-10-CM | POA: Diagnosis not present

## 2020-10-14 DIAGNOSIS — R6883 Chills (without fever): Secondary | ICD-10-CM | POA: Diagnosis present

## 2020-10-14 DIAGNOSIS — Z7982 Long term (current) use of aspirin: Secondary | ICD-10-CM | POA: Diagnosis not present

## 2020-10-14 DIAGNOSIS — M5440 Lumbago with sciatica, unspecified side: Secondary | ICD-10-CM | POA: Diagnosis not present

## 2020-10-14 DIAGNOSIS — Z794 Long term (current) use of insulin: Secondary | ICD-10-CM | POA: Diagnosis not present

## 2020-10-14 DIAGNOSIS — Z9889 Other specified postprocedural states: Secondary | ICD-10-CM | POA: Diagnosis not present

## 2020-10-14 DIAGNOSIS — G8918 Other acute postprocedural pain: Secondary | ICD-10-CM | POA: Diagnosis not present

## 2020-10-14 DIAGNOSIS — Z6838 Body mass index (BMI) 38.0-38.9, adult: Secondary | ICD-10-CM | POA: Diagnosis not present

## 2020-10-14 DIAGNOSIS — E860 Dehydration: Secondary | ICD-10-CM | POA: Diagnosis not present

## 2020-10-14 DIAGNOSIS — M4800 Spinal stenosis, site unspecified: Secondary | ICD-10-CM | POA: Diagnosis not present

## 2020-10-14 DIAGNOSIS — Z87891 Personal history of nicotine dependence: Secondary | ICD-10-CM | POA: Diagnosis not present

## 2020-10-14 DIAGNOSIS — Z8249 Family history of ischemic heart disease and other diseases of the circulatory system: Secondary | ICD-10-CM | POA: Diagnosis not present

## 2020-10-14 DIAGNOSIS — K219 Gastro-esophageal reflux disease without esophagitis: Secondary | ICD-10-CM | POA: Diagnosis present

## 2020-10-14 DIAGNOSIS — W19XXXA Unspecified fall, initial encounter: Secondary | ICD-10-CM | POA: Diagnosis not present

## 2020-10-14 DIAGNOSIS — E785 Hyperlipidemia, unspecified: Secondary | ICD-10-CM | POA: Diagnosis not present

## 2020-10-14 DIAGNOSIS — K5900 Constipation, unspecified: Secondary | ICD-10-CM | POA: Diagnosis not present

## 2020-10-14 DIAGNOSIS — Z79899 Other long term (current) drug therapy: Secondary | ICD-10-CM | POA: Diagnosis not present

## 2020-10-14 DIAGNOSIS — Z743 Need for continuous supervision: Secondary | ICD-10-CM | POA: Diagnosis not present

## 2020-10-14 DIAGNOSIS — Z83438 Family history of other disorder of lipoprotein metabolism and other lipidemia: Secondary | ICD-10-CM | POA: Diagnosis not present

## 2020-10-14 DIAGNOSIS — Z833 Family history of diabetes mellitus: Secondary | ICD-10-CM | POA: Diagnosis not present

## 2020-10-14 DIAGNOSIS — R5381 Other malaise: Secondary | ICD-10-CM | POA: Diagnosis not present

## 2020-10-14 DIAGNOSIS — M6282 Rhabdomyolysis: Secondary | ICD-10-CM | POA: Diagnosis not present

## 2020-10-14 DIAGNOSIS — E861 Hypovolemia: Secondary | ICD-10-CM | POA: Diagnosis present

## 2020-10-14 DIAGNOSIS — R748 Abnormal levels of other serum enzymes: Secondary | ICD-10-CM | POA: Diagnosis not present

## 2020-10-14 DIAGNOSIS — Z9181 History of falling: Secondary | ICD-10-CM | POA: Diagnosis not present

## 2020-10-14 DIAGNOSIS — Z90710 Acquired absence of both cervix and uterus: Secondary | ICD-10-CM | POA: Diagnosis not present

## 2020-10-14 DIAGNOSIS — W08XXXA Fall from other furniture, initial encounter: Secondary | ICD-10-CM | POA: Diagnosis present

## 2020-10-14 DIAGNOSIS — E1169 Type 2 diabetes mellitus with other specified complication: Secondary | ICD-10-CM | POA: Diagnosis not present

## 2020-10-14 DIAGNOSIS — R531 Weakness: Secondary | ICD-10-CM | POA: Diagnosis not present

## 2020-10-14 DIAGNOSIS — Z20822 Contact with and (suspected) exposure to covid-19: Secondary | ICD-10-CM | POA: Diagnosis present

## 2020-10-14 DIAGNOSIS — E1142 Type 2 diabetes mellitus with diabetic polyneuropathy: Secondary | ICD-10-CM | POA: Diagnosis not present

## 2020-10-14 DIAGNOSIS — E876 Hypokalemia: Secondary | ICD-10-CM | POA: Diagnosis not present

## 2020-10-14 DIAGNOSIS — Z7984 Long term (current) use of oral hypoglycemic drugs: Secondary | ICD-10-CM | POA: Diagnosis not present

## 2020-10-14 LAB — CBC WITH DIFFERENTIAL/PLATELET
Abs Immature Granulocytes: 0.04 10*3/uL (ref 0.00–0.07)
Basophils Absolute: 0 10*3/uL (ref 0.0–0.1)
Basophils Relative: 1 %
Eosinophils Absolute: 0.4 10*3/uL (ref 0.0–0.5)
Eosinophils Relative: 4 %
HCT: 29.6 % — ABNORMAL LOW (ref 36.0–46.0)
Hemoglobin: 9 g/dL — ABNORMAL LOW (ref 12.0–15.0)
Immature Granulocytes: 1 %
Lymphocytes Relative: 30 %
Lymphs Abs: 2.6 10*3/uL (ref 0.7–4.0)
MCH: 22.7 pg — ABNORMAL LOW (ref 26.0–34.0)
MCHC: 30.4 g/dL (ref 30.0–36.0)
MCV: 74.7 fL — ABNORMAL LOW (ref 80.0–100.0)
Monocytes Absolute: 0.9 10*3/uL (ref 0.1–1.0)
Monocytes Relative: 11 %
Neutro Abs: 4.7 10*3/uL (ref 1.7–7.7)
Neutrophils Relative %: 53 %
Platelets: 211 10*3/uL (ref 150–400)
RBC: 3.96 MIL/uL (ref 3.87–5.11)
RDW: 17.6 % — ABNORMAL HIGH (ref 11.5–15.5)
WBC: 8.7 10*3/uL (ref 4.0–10.5)
nRBC: 0 % (ref 0.0–0.2)

## 2020-10-14 LAB — GLUCOSE, CAPILLARY
Glucose-Capillary: 157 mg/dL — ABNORMAL HIGH (ref 70–99)
Glucose-Capillary: 134 mg/dL — ABNORMAL HIGH (ref 70–99)
Glucose-Capillary: 135 mg/dL — ABNORMAL HIGH (ref 70–99)
Glucose-Capillary: 163 mg/dL — ABNORMAL HIGH (ref 70–99)
Glucose-Capillary: 157 mg/dL — ABNORMAL HIGH (ref 70–99)

## 2020-10-14 LAB — COMPREHENSIVE METABOLIC PANEL
ALT: 24 U/L (ref 0–44)
AST: 29 U/L (ref 15–41)
Albumin: 2.4 g/dL — ABNORMAL LOW (ref 3.5–5.0)
Alkaline Phosphatase: 50 U/L (ref 38–126)
Anion gap: 11 (ref 5–15)
BUN: 7 mg/dL — ABNORMAL LOW (ref 8–23)
CO2: 22 mmol/L (ref 22–32)
Calcium: 7.9 mg/dL — ABNORMAL LOW (ref 8.9–10.3)
Chloride: 110 mmol/L (ref 98–111)
Creatinine, Ser: 0.53 mg/dL (ref 0.44–1.00)
GFR, Estimated: >60 mL/min (ref >60)
Glucose, Bld: 149 mg/dL — ABNORMAL HIGH (ref 70–99)
Potassium: 3.1 mmol/L — ABNORMAL LOW (ref 3.5–5.1)
Sodium: 143 mmol/L (ref 135–145)
Total Bilirubin: 0.6 mg/dL (ref 0.3–1.2)
Total Protein: 5.5 g/dL — ABNORMAL LOW (ref 6.5–8.1)

## 2020-10-14 LAB — HEMOGLOBIN A1C
Hgb A1c MFr Bld: 6.9 % — ABNORMAL HIGH (ref 4.8–5.6)
Hgb A1c MFr Bld: 6.8 % — ABNORMAL HIGH (ref 4.8–5.6)
Mean Plasma Glucose: 151 mg/dL
Mean Plasma Glucose: 148 mg/dL

## 2020-10-14 LAB — PHOSPHORUS: Phosphorus: 2.8 mg/dL (ref 2.5–4.6)

## 2020-10-14 LAB — MAGNESIUM: Magnesium: 1.8 mg/dL (ref 1.7–2.4)

## 2020-10-14 LAB — CK: Total CK: 781 U/L — ABNORMAL HIGH (ref 38–234)

## 2020-10-14 MED ORDER — FENTANYL 50 MCG/HR TD PT72
1.0000 | MEDICATED_PATCH | TRANSDERMAL | Status: DC
  Administered 2020-10-14 – 2020-10-17 (×2): 1 via TRANSDERMAL
  Filled 2020-10-14 (×2): qty 1

## 2020-10-14 NOTE — Progress Notes (Signed)
Patient ID: Amanda Morris, female   DOB: [birth year], 70 y.o.   MRN: [id number] BP (!) 144/94 (BP Location: Left Arm)   Pulse 83   Temp 98.6 F (37 C) (Oral)   Resp 20   Ht 5\' 4"  (1.626 m)   Wt 100 kg   LMP  (LMP Unknown)   SpO2 95%   BMI 37.84 kg/m  Alert and oriented x 4 Recommended that she not go home but first go to a rehab facility. Otherwise improved.

## 2020-10-14 NOTE — Progress Notes (Signed)
Physical Therapy Treatment Patient Details Name: Amanda Morris MRN: [id number] DOB: 1950-07-01 Today's Date: 10/14/2020    History of Present Illness Pt is a 70 y/o female presenting on 6/18 to the ER with back pain after being found down. Of note pt with recent hx of lumbar surgery 6/16 with L5 and S1 decompression and fusion. CXR and CT lumbar showed no acute processes. PMH: insulin-dependent type 2 diabetes, dyslipidemia, hypertension, chronic back pain.    PT Comments    Pt supine in bed on arrival this session.  Pt continues to benefit from skilled rehab in a post acute setting.  Pt reports dizziness with gt and require min assistance for gt training.  Continue to follow acutely.      Follow Up Recommendations  SNF     Equipment Recommendations  Wheelchair (measurements PT) (if patient decides to go home.)    Recommendations for Other Services       Precautions / Restrictions Precautions Precautions: Back Precaution Booklet Issued: No Precaution Comments: pt is able to recall 2/3 back precautions and requires visual cues for twisting precautions Restrictions Weight Bearing Restrictions: No Other Position/Activity Restrictions: do not lift more than 10 lbs    Mobility  Bed Mobility Overal bed mobility: Needs Assistance Bed Mobility: Rolling;Sidelying to Sit Rolling: Min guard Sidelying to sit: Min guard       General bed mobility comments: cues for log rolling    Transfers Overall transfer level: Needs assistance Equipment used: Rolling walker (2 wheeled) Transfers: Sit to/from Stand Sit to Stand: Min assist         General transfer comment: Pt pulling on RW to rise into standing.  Pt required cues to push from seated surface but still continues to pull on RW.  Ambulation/Gait Ambulation/Gait assistance: Min assist Gait Distance (Feet): 50 Feet Assistive device: Rolling walker (2 wheeled) Gait Pattern/deviations: Shuffle     General Gait  Details: Cues for posture, RW safety and foot clearance.  pt reports mild dizziness during gt training.  Assessed BP back in her room.  142/74 ( 93).   Stairs             Wheelchair Mobility    Modified Rankin (Stroke Patients Only)       Balance Overall balance assessment: Needs assistance Sitting-balance support: Feet supported;No upper extremity supported Sitting balance-Leahy Scale: Good     Standing balance support: Bilateral upper extremity supported Standing balance-Leahy Scale: Poor Standing balance comment: reliant on BUE support for balance                            Cognition Arousal/Alertness: Awake/alert Behavior During Therapy: WFL for tasks assessed/performed Overall Cognitive Status: Impaired/Different from baseline Area of Impairment: Memory                     Memory: Decreased recall of precautions         General Comments: flat affect - pleasant and cooperative      Exercises      General Comments General comments (skin integrity, edema, etc.): VSS in RA, reviewed all back precautions & compensatory technqiues      Pertinent Vitals/Pain Pain Assessment: Faces Pain Score: 6  Faces Pain Scale: Hurts a little bit Pain Location: lower back Pain Descriptors / Indicators: Aching Pain Intervention(s): Monitored during session;Repositioned    Home Living Family/patient expects to be discharged to:: Private residence Living Arrangements: Alone Available Help  at Discharge: Family;Available PRN/intermittently Type of Home: Apartment Home Access: Level entry   Home Layout: One level Home Equipment: Shower seat - built in;Adaptive equipment;Grab bars - tub/shower;Walker - 2 wheels Additional Comments: Pt is agreeable to SNF d/c post acute this admission    Prior Function Level of Independence: Independent with assistive device(s)      Comments: Retired from working at [employer]. Now enjoys spending time with family;  drives - pt reports using RW for ambulation   PT Goals (current goals can now be found in the care plan section) Acute Rehab PT Goals Patient Stated Goal: go to SNF to get stronger and then go home PT Goal Formulation: With patient Potential to Achieve Goals: Good Progress towards PT goals: Progressing toward goals    Frequency           PT Plan Current plan remains appropriate    Co-evaluation              AM-PAC PT "6 Clicks" Mobility   Outcome Measure  Help needed turning from your back to your side while in a flat bed without using bedrails?: A Little Help needed moving from lying on your back to sitting on the side of a flat bed without using bedrails?: A Little Help needed moving to and from a bed to a chair (including a wheelchair)?: A Little Help needed standing up from a chair using your arms (e.g., wheelchair or bedside chair)?: A Little Help needed to walk in hospital room?: A Little Help needed climbing 3-5 steps with a railing? : A Little 6 Click Score: 18    End of Session Equipment Utilized During Treatment: Gait belt Activity Tolerance: Patient limited by pain Patient left: with call bell/phone within reach;in chair;with chair alarm set Nurse Communication: Mobility status (need for glucerna) PT Visit Diagnosis: Other abnormalities of gait and mobility (R26.89);Pain;Unsteadiness on feet (R26.81);Muscle weakness (generalized) (M62.81);History of falling (Z91.81) Pain - part of body: Hip (back)     Time: 1020-1037 PT Time Calculation (min) (ACUTE ONLY): 17 min  Charges:  $Gait Training: 8-22 mins                     Amanda Morris , PTA Acute Rehabilitation Services Pager [phone] Office [phone]    Amanda Morris Amanda Morris 10/14/2020, 10:46 AM

## 2020-10-14 NOTE — TOC Progression Note (Addendum)
Transition of Care [facility]) - Progression Note    Patient Details  Name: Amanda Morris MRN: [id number] Date of Birth: Mar 09, 1951  Transition of [facility]) CM/SW [place], [place] Phone Number: 10/14/2020, 10:27 AM  Clinical Narrative:    CSW spoke with pt's niece Amanda Morris concerning SNF choices for pt in [place].  Pt's niece would like the following SNF's for placement:  [facility]: Facility is full for several weeks  [facility]: Admission Designer, fashion/clothing, Phone # [phone], fax # [phone] ,   [facility]: admission director Amanda Morris, Phone [phone]   Amanda Morris Phone, admission director, Sydell Axon # [phone] Fax # [phone]  [facility] will continue to assist for disposition planning.   Expected Discharge Plan: Skilled Nursing Facility Barriers to Discharge: Continued Medical Work up, SNF Pending bed offer  Expected Discharge Plan and Services Expected Discharge Plan: [place] In-house Referral: Clinical Social Work     Living arrangements for the past 2 months: Apartment                                       Social Determinants of Health (SDOH) Interventions    Readmission Risk Interventions No flowsheet data found.

## 2020-10-14 NOTE — Progress Notes (Signed)
Pt seen in room and c/o lightheadedness, BS checked 163, VSS. Pt alert/oriented in no apparent distress., Pt currently on IVF D5 0.9nacl @125ml /hr.  Pt closely monitored.

## 2020-10-14 NOTE — Progress Notes (Signed)
Occupational Therapy Evaluation Patient Details Name: Amanda Morris MRN: [id number] DOB: [birth year] Today's Date: 10/14/2020    History of Present Illness Pt is a 70 y/o female presenting on 6/18 to the ER with back pain after being found down. Of note pt with recent hx of lumbar surgery 6/16 with L5 and S1 decompression and fusion. CXR and CT lumbar showed no acute processes. PMH: insulin-dependent type 2 diabetes, dyslipidemia, hypertension, chronic back pain.   Clinical Impression   Amanda Morris was evaluated s/p the above fall after lumbar surgery 6/16. PTA pt was living alone in a level entry apartment, she reports being mod I in all ADL/IADLs and ambulating with a rw. She reports that she was sitting on her couch when her hip cramped and caused her to fall off of the couch, she was on the floor for 24 hours prior to family finding her. Upon this evaluation pt is close min guard for all mobility including short ambulation to chair with rw. Pt requires assist for lower body ADLs due to back pain, compensatory techniques, and to maintain back precautions. Pt benefits from continued OT acutely to progress indep in all ADLs and mobility. Recommend d/c to SNF.     Follow Up Recommendations  SNF;Supervision/Assistance - 24 hour    Equipment Recommendations  None recommended by OT       Precautions / Restrictions Precautions Precautions: Back Precaution Booklet Issued: No Precaution Comments: pt is able to recall 2/3 back precautions and requires visual cues for twisting precautions Restrictions Weight Bearing Restrictions: No      Mobility Bed Mobility Overal bed mobility: Needs Assistance Bed Mobility: Rolling;Sidelying to Sit Rolling: Min guard Sidelying to sit: Min guard       General bed mobility comments: cues for log rolling    Transfers Overall transfer level: Needs assistance Equipment used: Rolling walker (2 wheeled) Transfers: Sit to/from Stand Sit to Stand: Min  guard         General transfer comment: min guard with rw & vc for hand placement - close min guard for pivot    Balance Overall balance assessment: Needs assistance Sitting-balance support: Feet supported;No upper extremity supported Sitting balance-Leahy Scale: Good     Standing balance support: Bilateral upper extremity supported Standing balance-Leahy Scale: Poor Standing balance comment: reliant on BUE support for balance         ADL either performed or assessed with clinical judgement   ADL Overall ADL's : Needs assistance/impaired Eating/Feeding: Independent;Sitting   Grooming: Oral care;Wash/dry hands;Wash/dry face;Sitting   Upper Body Bathing: Set up;Cueing for compensatory techniques;Sitting   Lower Body Bathing: Moderate assistance;Cueing for back precautions;Cueing for compensatory techniques;Sit to/from stand   Upper Body Dressing : Set up;Sitting   Lower Body Dressing: Moderate assistance;Sit to/from stand;Cueing for compensatory techniques Lower Body Dressing Details (indicate cue type and reason): assist for threading BLE Toilet Transfer: Min guard;RW;Stand-pivot;BSC   Toileting- Clothing Manipulation and Hygiene: Min guard;Cueing for compensatory techniques;Cueing for back precautions       Functional mobility during ADLs: Min guard;Rolling walker General ADL Comments: assist levels for back precautions and compensatory techniques, pt limited by lower back pain - min guard with RW for short ambulation distacnes     Vision Baseline Vision/History: No visual deficits Patient Visual Report: No change from baseline        Pertinent Vitals/Pain Pain Assessment: Faces Faces Pain Scale: Hurts a little bit Pain Location: lower back Pain Descriptors / Indicators: Aching Pain Intervention(s): Monitored during session  Hand Dominance Right   Extremity/Trunk Assessment Upper Extremity Assessment Upper Extremity Assessment: Overall WFL for tasks  assessed   Lower Extremity Assessment Lower Extremity Assessment: Defer to PT evaluation   Cervical / Trunk Assessment Cervical / Trunk Assessment: Other exceptions Cervical / Trunk Exceptions: s/p Lumbar surgery   Communication Communication Communication: No difficulties   Cognition Arousal/Alertness: Awake/alert Behavior During Therapy: WFL for tasks assessed/performed Overall Cognitive Status: Impaired/Different from baseline Area of Impairment: Memory     Memory: Decreased recall of precautions         General Comments: flat affect - pleasant and cooroperative   General Comments  VSS in RA, reviewed all back precautions & compensatory technqiues     Home Living Family/patient expects to be discharged to:: Private residence Living Arrangements: Alone Available Help at Discharge: Family;Available PRN/intermittently Type of Home: Apartment Home Access: Level entry     Home Layout: One level     Bathroom Shower/Tub: Teacher, early years/pre: Standard     Home Equipment: Shower seat - built in;Adaptive equipment;Grab bars - tub/shower;Walker - 2 wheels Adaptive Equipment: Reacher Additional Comments: Pt is agreeable to SNF d/c post acute this admission      Prior Functioning/Environment Level of Independence: Independent with assistive device(s)        Comments: Retired from working at [employer]. Now enjoys spending time with family; drives - pt reports using RW for ambulation        OT Problem List: Decreased strength;Decreased range of motion;Decreased activity tolerance;Impaired balance (sitting and/or standing);Decreased safety awareness;Decreased knowledge of use of DME or AE;Decreased knowledge of precautions;Pain      OT Treatment/Interventions: Self-care/ADL training;Therapeutic exercise;DME and/or AE instruction;Therapeutic activities;Patient/family education;Balance training    OT Goals(Current goals can be found in the care plan  section) Acute Rehab OT Goals Patient Stated Goal: go to SNF to get stronger and then go home OT Goal Formulation: With patient Time For Goal Achievement: 10/28/20 Potential to Achieve Goals: Good ADL Goals Pt Will Perform Grooming: with supervision;standing Pt Will Perform Lower Body Bathing: with supervision;sit to/from stand Pt Will Perform Lower Body Dressing: with supervision;with adaptive equipment;sit to/from stand Pt Will Transfer to Toilet: with supervision;ambulating;regular height toilet Additional ADL Goal #1: Pt will demonstrate ability to maintain all back prcautions while completing functional tasks to promate safe healing.  OT Frequency: Min 2X/week   Barriers to D/C: Decreased caregiver support  Pt does not have support at home          AM-PAC OT "6 Clicks" Daily Activity     Outcome Measure Help from another person eating meals?: None Help from another person taking care of personal grooming?: A Little Help from another person toileting, which includes using toliet, bedpan, or urinal?: A Little Help from another person bathing (including washing, rinsing, drying)?: A Lot Help from another person to put on and taking off regular upper body clothing?: A Little Help from another person to put on and taking off regular lower body clothing?: A Lot 6 Click Score: 17   End of Session Equipment Utilized During Treatment: Gait belt;Rolling walker Nurse Communication: Mobility status  Activity Tolerance: Patient tolerated treatment well Patient left: in chair;with call bell/phone within reach;with chair alarm set  OT Visit Diagnosis: Unsteadiness on feet (R26.81);Muscle weakness (generalized) (M62.81);History of falling (Z91.81);Pain Pain - part of body:  (back)                Time: 9381-0175 OT Time Calculation (min): 12  min Charges:  OT General Charges $OT Visit: 1 Visit OT Evaluation $OT Eval Low Complexity: 1 Low   Siddhant Hashemi A Idali Lafever 10/14/2020, 8:52 AM

## 2020-10-14 NOTE — Discharge Summary (Signed)
Physician Discharge Summary  Patient ID: Amanda Morris MRN: [id number] DOB/AGE: [birth year] 70 y.o.  Admit date: 10/09/2020 Discharge date: 10/14/2020  Admission Diagnoses:lumbar adjacent segment disease, L5/S1  Discharge Diagnoses:  Active Problems:   Spinal stenosis   Lumbar adjacent segment disease with spondylolisthesis   Discharged Condition: good  Hospital Course: Amanda Morris underwent a lumbar decompression and arthrodesis at L5/S1. Post op she is voiding, ambulating, and tolerating a regular diet. Her wound is clean, dry, and without signs of infection.  She states her pain is improved slightly.  Treatments: surgery: LUMBAR FIVE-SACRAL ONE DECOMPRESSION WITH PLACEMENT OF SCREWS AND RODS EXTENDING EXISTING LUMBAR FUSION   Discharge Exam: Blood pressure 128/79, pulse 95, temperature 98.3 F (36.8 C), temperature source Oral, resp. rate 17, height _0  (1.626 m), weight 99.8 kg, SpO2 98 %. General appearance: alert, cooperative, appears stated age, and mild distress  Disposition: Discharge disposition: 01-Home or Self Care      Spondylolisthesis, Lumbar 5 - Sacral one  Allergies as of 10/10/2020       Reactions   Penicillins Hives, Other (See Comments)   PATIENT HAS HAD A PCN REACTION WITH IMMEDIATE RASH, FACIAL/TONGUE/THROAT SWELLING, SOB, OR LIGHTHEADEDNESS WITH HYPOTENSION:  #  #  YES  #  #  Has patient had a PCN reaction causing severe rash involving mucus membranes or skin necrosis: No Has patient had a PCN reaction that required hospitalization: No Has patient had a PCN reaction occurring within the last 10 years: No If all of the above answers are "NO", then may proceed with Cephalosporin use.   Duloxetine Other (See Comments)   GI upset   Aspirin Nausea Only   Codeine Itching   Hydrocodone Nausea Only   Nose bleed   Metformin And Related Diarrhea, Nausea Only   Oxycodone Nausea And Vomiting   GI Upset (intolerance)   Victoza [liraglutide] Other  (See Comments)   Abdominal pain        Medication List     STOP taking these medications    fluticasone 50 MCG/ACT nasal spray Commonly known as: FLONASE       TAKE these medications    Accu-Chek Aviva Plus test strip Generic drug: glucose blood FOR TESTING BLOOD SUGARS 3 TIMES DAILY. DX:E11.9   Accu-Chek Aviva Plus w/Device Kit Check blood sugars 3 times daily DX:E11.9   Accu-Chek Softclix Lancets lancets Dx:E11.9   aspirin EC 81 MG tablet Take 1 tablet (81 mg total) by mouth daily.   B-D ULTRAFINE III SHORT PEN 31G X 8 MM Misc Generic drug: Insulin Pen Needle USE AS DIRECTED   cholecalciferol 1000 units tablet Commonly known as: VITAMIN D Take 1,000 Units by mouth daily.   dicyclomine 20 MG tablet Commonly known as: BENTYL Take 20 mg by mouth every 6 (six) hours as needed for spasms (cramps/stomach pain).   doxepin 10 MG capsule Commonly known as: SINEQUAN Take 10 mg by mouth at bedtime.   glipiZIDE 10 MG tablet Commonly known as: GLUCOTROL Take 1 tablet (10 mg total) by mouth 2 (two) times daily before a meal.   Krill Oil 500 MG Caps Take 500 mg by mouth daily.   Lantus SoloStar 100 UNIT/ML Solostar Pen Generic drug: insulin glargine Inject 36 Units into the skin at bedtime as needed (high blood sugar).   levocetirizine 5 MG tablet Commonly known as: XYZAL Take 1 tablet (5 mg total) by mouth every evening.   ondansetron 4 MG disintegrating tablet Commonly known as: Zofran ODT  Take 1 tablet (4 mg total) by mouth every 8 (eight) hours as needed for nausea or vomiting.   OVER THE COUNTER MEDICATION Take 1 tablet by mouth 3 (three) times daily. Golo otc weight loss supplement   perphenazine 8 MG tablet Commonly known as: TRILAFON Take 8 mg by mouth at bedtime.   pravastatin 40 MG tablet Commonly known as: PRAVACHOL Take 1 tablet (40 mg total) by mouth at bedtime.   pregabalin 200 MG capsule Commonly known as: LYRICA TAKE 1 CAPSULE (200 MG  TOTAL) BY MOUTH 3 (THREE) TIMES DAILY.   tiZANidine 4 MG tablet Commonly known as: ZANAFLEX Take 1 tablet (4 mg total) by mouth every 6 (six) hours as needed for muscle spasms.   traMADol 50 MG tablet Commonly known as: ULTRAM Take 1-2 tablets (50-100 mg total) by mouth every 6 (six) hours as needed for up to 7 days for moderate pain.       ASK your doctor about these medications    empagliflozin 25 MG Tabs tablet Commonly known as: Jardiance TAKE 25 MG BY MOUTH DAILY.        Follow-up Information     Ashok Pall, MD Follow up in 2 week(s).   Specialty: Neurosurgery Why: please call to make an appointment to remove sutures Contact information: [address] [place] [address] [place] [address] [phone]                 Signed: Ashok Pall 10/14/2020, 6:38 PM

## 2020-10-14 NOTE — Progress Notes (Signed)
PROGRESS NOTE    Amanda Morris  KNL:[id number] DOB: 26-Apr-1951 DOA: 10/11/2020 PCP: Hali Marry, MD     Brief Narrative:  70 y.o. BF PMHx insulin-dependent type 2 diabetes, dyslipidemia, HTN, chronic back pain   Presented to the ER on 10/11/2020 with back pain and being found on the ground for some period of time with recent history of lumbar surgery on 10/09/2020.   Patient was hospitalized for elective surgery on her back on 10/09/2020.  She had L5 and S1 decompressed and rods placed.  Apparently she tolerated the procedure well and she was discharged home the following day, 10/10/2020.  She then said she went home and while in the living room she dropped to the ground due to lower extremity weakness.  She did not hit her head and did not lose consciousness.  She says she has a walker at home but was not using.  She laid on the ground and could not get up.  She does not live with anyone at home.  Then sometime later, she had family check on her they found her on the ground.  They were able to get her up and she was able to ambulate some, but due to some worsening back pain, she came to the ED for evaluation.  She has not eaten or drinking or taking her medications due to being on the ground for extended period time.       ED course: -Vitals on admission: Afebrile, heart rate 123, respiratory rate 18, blood pressure 123/70, maintaining sats on room air -Labs on initial presentation: Sodium 136, potassium 3.3, chloride 104, bicarb 22, glucose 166, BUN 11, creatinine 0.77, calcium 8.3, albumin 3.2, WBC 17.2, hemoglobin 10.8, CK 1700 -Imaging obtained on admission:  Chest x-ray showed no acute processes.  CT lumbar spine showed no acute processes and findings consistent with recent surgery. -In the ED the patient was given 1 L of IV fluids, Zofran, morphine, Tylenol.  Neurosurgery was contacted by the ER, recommended admission to the hospital service for hydration, no  advanced/further imaging recommended, and the hospitalist service was contacted for further evaluation and management.   Subjective: 6/21 afebrile overnight, A/O x4.  States in constant pain because she will not take the p.o. narcotics, makes her extremely nauseous.  Patient is willing to take p.o. if we provide antiemetic medication prior to her taking narcotics first.   Assessment & Plan: Covid vaccination; vaccinated 3/4   Active Problems:   Chronic low back pain   Hyperlipidemia associated with type 2 diabetes mellitus ([place])   Type 2 diabetes mellitus with other specified complication (HCC)   Obesity (BMI 30-39.9)   Essential hypertension   Spinal stenosis   Fall   Elevated CK   Rhabdomyolysis  S/P L5-S1 with decompression and lumbar fusion -Surgery was performed on 10/09/2020 and was discharged on 10/10/2020 -Lumbar CT on admission showed no acute processes.  No further imaging recommended by neurosurgery - Neurosurgery contacted by the ER, will evaluate patient, but recommended admission to the hospital service - 6/21 pain uncontrolled:  - Changed to Fentanyl 50 mcg patch  -Norco PRN: Administer Zofran 4 mg 30 minutes prior to medication  -Lyrica 200 mg TID -6/20 PT/OT consult; while patient in hospital were daily with patient on range of motion and strengthening    S/P ground-level fall/ -Shortly after returning home, patient had a ground-level fall due to lower extremity weakness.  Denies hitting her head or loss of consciousness - Fall precautions -  PT consulted   Elevated CK/Rhabdomyolysis -On admission CK 1700 -UA did not show any hematuria and no AKI seen on admission - 1 L of IV fluids ordered in the ER, will continue maintenance IV fluids afterwards Lab Results  Component Value Date   CKTOTAL 781 (H) 10/14/2020   CKTOTAL 873 (H) 10/13/2020   CKTOTAL 832 (H) 10/12/2020   CKTOTAL 1,755 (H) 10/11/2020   CKTOTAL 141 11/28/2019  -6/20 D5-0.9% saline  175ml/hr  DM type II controlled without complication - ADA diet -6/20 Hemoglobin A1c= 6.8 -6/20 moderate SSI   Dyslipidemia -Hold home statin for now given elevated CK - 6/20 LDL= 75   Obese (BMI 37.84 kg/m) -BMI 38 - Recommend low calorie diet, exercise, weight loss    Hypokalemia - Potassium goal> 4 - 6/21 Potassium IV 60 mEq  Hypomagnesmia - Magnesium goal> 2 - Magnesium IV 2 g      DVT prophylaxis: Lovenox Code Status: Full Family Communication:  Status is: Inpatient    Dispo: The patient is from: Home              Anticipated d/c is to: Home              Anticipated d/c date is: 6/23              Patient currently unstable      Consultants:  Orthopedic surgery   Procedures/Significant Events:  6/19 DG hip unilateral W or W0 pelvis 1V RIGHT; 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 Lower lumbosacral spine fusion. Battery apparatus overlies the right ilium  I have personally reviewed and interpreted all radiology studies and my findings are as above.  VENTILATOR SETTINGS:    Cultures 6/19 SARS coronavirus negative   Antimicrobials: Anti-infectives (From admission, onward)    None         Devices    LINES / TUBES:      Continuous Infusions:  dextrose 5 % and 0.9% NaCl 125 mL/hr at 10/14/20 0950      Objective: Vitals:   10/13/20 1545 10/13/20 2147 10/14/20 0700 10/14/20 1114  BP: 123/74 129/88 120/63 (!) 106/91  Pulse: 98 89 78 79  Resp: 17 16 17 18   Temp: 98.9 F (37.2 C) 97.6 F (36.4 C) 98.6 F (37 C) 98 F (36.7 C)  TempSrc:  Oral Oral Oral  SpO2: 100% 97% 95% 95%  Weight:      Height:        Intake/Output Summary (Last 24 hours) at 10/14/2020 1548 Last data filed at 10/14/2020 0500 Gross per 24 hour  Intake 1283.81 ml  Output --  Net 1283.81 ml    Filed Weights   10/11/20 1937  Weight: 100 kg    Examination:  General: No acute respiratory distress Eyes: negative scleral hemorrhage,  negative anisocoria, negative icterus ENT: Negative Runny nose, negative gingival bleeding, Neck:  Negative scars, masses, torticollis, lymphadenopathy, JVD Lungs: Clear to auscultation bilaterally without wheezes or crackles Cardiovascular: Regular rate and rhythm without murmur gallop or rub normal S1 and S2 Abdomen: negative abdominal pain, nondistended, positive soft, bowel sounds, no rebound, no ascites, no appreciable mass Extremities: No significant cyanosis, clubbing, or edema bilateral lower extremities Skin: Negative rashes, lesions, ulcers Psychiatric:  Negative depression, negative anxiety, negative fatigue, negative mania  Central nervous system:  Cranial nerves II through XII intact, tongue/uvula midline, all extremities muscle strength 5/5, sensation intact throughout, finger nose finger bilateral within normal limits, quick finger touch bilateral within normal limits, negative  Romberg sign, heel to shin bilateral within normal limits, standing on 1 foot bilateral within normal limits, walking on tiptoes within normal limits, walking on heels within normal limits, negative dysarthria, negative expressive aphasia, negative receptive aphasia.  .     Data Reviewed: Care during the described time interval was provided by me .  I have reviewed this patient's available data, including medical history, events of note, physical examination, and all test results as part of my evaluation.  CBC: Recent Labs  Lab 10/09/20 1606 10/11/20 2025 10/13/20 0257 10/14/20 0358  WBC  --  17.2* 10.4 8.7  NEUTROABS  --  11.9* 5.8 4.7  HGB 12.6 10.8* 9.8* 9.0*  HCT 37.0 35.5* 32.5* 29.6*  MCV  --  74.6* 75.4* 74.7*  PLT  --  244 193 381    Basic Metabolic Panel: Recent Labs  Lab 10/09/20 1606 10/11/20 2025 10/12/20 0025 10/12/20 0800 10/13/20 0257 10/14/20 0358  NA 145 136  --  141 143 143  K 3.7 3.3*  --  3.3* 3.8 3.1*  CL 107 104  --  111 113* 110  CO2  --  22  --  22 23 22    GLUCOSE 88 166*  --  94 89 149*  BUN 9 11  --  12 12 7*  CREATININE 0.60 0.77  --  0.63 0.60 0.53  CALCIUM  --  8.3*  --  7.8* 8.0* 7.9*  MG  --   --  1.9  --  2.1 1.8  PHOS  --   --  2.6  --  2.6 2.8    GFR: Estimated Creatinine Clearance: 77.4 mL/min (by C-G formula based on SCr of 0.53 mg/dL). Liver Function Tests: Recent Labs  Lab 10/11/20 2025 10/13/20 0257 10/14/20 0358  AST 51* 35 29  ALT 25 25 24   ALKPHOS 64 58 50  BILITOT 0.9 1.0 0.6  PROT 6.3* 6.1* 5.5*  ALBUMIN 3.2* 2.7* 2.4*    No results for input(s): LIPASE, AMYLASE in the last 168 hours. No results for input(s): AMMONIA in the last 168 hours. Coagulation Profile: No results for input(s): INR, PROTIME in the last 168 hours. Cardiac Enzymes: Recent Labs  Lab 10/11/20 2025 10/12/20 0800 10/13/20 0257 10/14/20 0358  CKTOTAL 1,755* 832* 873* 781*    BNP (last 3 results) No results for input(s): PROBNP in the last 8760 hours. HbA1C: Recent Labs    10/13/20 0257 10/13/20 1759  HGBA1C 6.9* 6.8*   CBG: Recent Labs  Lab 10/13/20 1148 10/13/20 1650 10/13/20 2127 10/14/20 0506 10/14/20 1112  GLUCAP 81 90 130* 157* 134*    Lipid Profile: Recent Labs    10/13/20 0257 10/13/20 1759  CHOL 122 127  HDL 34* 32*  LDLCALC 70 75  TRIG 90 99  CHOLHDL 3.6 4.0    Thyroid Function Tests: No results for input(s): TSH, T4TOTAL, FREET4, T3FREE, THYROIDAB in the last 72 hours. Anemia Panel: No results for input(s): VITAMINB12, FOLATE, FERRITIN, TIBC, IRON, RETICCTPCT in the last 72 hours. Sepsis Labs: No results for input(s): PROCALCITON, LATICACIDVEN in the last 168 hours.  Recent Results (from the past 240 hour(s))  Surgical pcr screen     Status: None   Collection Time: 10/07/20  8:13 AM   Specimen: Nasal Mucosa; Nasal Swab  Result Value Ref Range Status   MRSA, PCR NEGATIVE NEGATIVE Final   Staphylococcus aureus NEGATIVE NEGATIVE Final    Comment: (NOTE) The Xpert SA Assay (FDA approved for  NASAL specimens in  patients 47 years of age and older), is one component of a comprehensive surveillance program. It is not intended to diagnose infection nor to guide or monitor treatment. Performed at [facility], [place] [address], [place], [place] [address]   SARS CORONAVIRUS 2 (TAT 6-24 HRS) Nasopharyngeal Nasopharyngeal Swab     Status: None   Collection Time: 10/07/20  8:54 AM   Specimen: Nasopharyngeal Swab  Result Value Ref Range Status   SARS Coronavirus 2 NEGATIVE NEGATIVE Final    Comment: (NOTE) SARS-CoV-2 target nucleic acids are NOT DETECTED.  The SARS-CoV-2 RNA is generally detectable in upper and lower respiratory specimens during the acute phase of infection. Negative results do not preclude SARS-CoV-2 infection, do not rule out co-infections with other pathogens, and should not be used as the sole basis for treatment or other patient management decisions. Negative results must be combined with clinical observations, patient history, and epidemiological information. The expected result is Negative.  Fact Sheet for Patients: SugarRoll.be  Fact Sheet for Healthcare Providers: https://www.-mathews.com/  This test is not yet approved or cleared by the [place] FDA and  has been authorized for detection and/or diagnosis of SARS-CoV-2 by FDA under an Emergency Use Authorization (EUA). This EUA will remain  in effect (meaning this test can be used) for the duration of the COVID-19 declaration under Se ction 564(b)(1) of the Act, 21 U.S.C. section 360bbb-3(b)(1), unless the authorization is terminated or revoked sooner.  Performed at [facility], [place] [address] [place], [place] [address]   Urine culture     Status: Abnormal   Collection Time: 10/11/20  9:10 PM   Specimen: Urine, Random  Result Value Ref Range Status   Specimen Description URINE, RANDOM  Final   Special Requests NONE  Final   Culture  (A)  Final    <10,000 COLONIES/mL INSIGNIFICANT GROWTH Performed at [facility], [place] [address], [place], [place] [address]    Report Status 10/13/2020 FINAL  Final  SARS CORONAVIRUS 2 (TAT 6-24 HRS) Nasopharyngeal Nasopharyngeal Swab     Status: None   Collection Time: 10/12/20  2:30 AM   Specimen: Nasopharyngeal Swab  Result Value Ref Range Status   SARS Coronavirus 2 NEGATIVE NEGATIVE Final    Comment: (NOTE) SARS-CoV-2 target nucleic acids are NOT DETECTED.  The SARS-CoV-2 RNA is generally detectable in upper and lower respiratory specimens during the acute phase of infection. Negative results do not preclude SARS-CoV-2 infection, do not rule out co-infections with other pathogens, and should not be used as the sole basis for treatment or other patient management decisions. Negative results must be combined with clinical observations, patient history, and epidemiological information. The expected result is Negative.  Fact Sheet for Patients: SugarRoll.be  Fact Sheet for Healthcare Providers: https://www.-mathews.com/  This test is not yet approved or cleared by the [place] FDA and  has been authorized for detection and/or diagnosis of SARS-CoV-2 by FDA under an Emergency Use Authorization (EUA). This EUA will remain  in effect (meaning this test can be used) for the duration of the COVID-19 declaration under Se ction 564(b)(1) of the Act, 21 U.S.C. section 360bbb-3(b)(1), unless the authorization is terminated or revoked sooner.  Performed at [facility], [place] [address] [place], [place] [address]           Radiology Studies: No results found.      Scheduled Meds:  enoxaparin (LOVENOX) injection  40 mg Subcutaneous Q24H   insulin  aspart  0-15 Units Subcutaneous TID WC   insulin aspart  0-5 Units Subcutaneous QHS   oxyCODONE  10 mg Oral Q12H   pregabalin  200 mg Oral TID   Continuous  Infusions:  dextrose 5 % and 0.9% NaCl 125 mL/hr at 10/14/20 0950      LOS: 0 days    Time spent:05 min    Briarrose Shor, Geraldo Docker, MD [facility]   If 7PM-7AM, please contact night-coverage 10/14/2020, 3:48 PM

## 2020-10-15 LAB — CBC WITH DIFFERENTIAL/PLATELET
Abs Immature Granulocytes: 0.04 10*3/uL (ref 0.00–0.07)
Basophils Absolute: 0.1 10*3/uL (ref 0.0–0.1)
Basophils Relative: 1 %
Eosinophils Absolute: 0.3 10*3/uL (ref 0.0–0.5)
Eosinophils Relative: 4 %
HCT: 29.7 % — ABNORMAL LOW (ref 36.0–46.0)
Hemoglobin: 9.2 g/dL — ABNORMAL LOW (ref 12.0–15.0)
Immature Granulocytes: 1 %
Lymphocytes Relative: 31 %
Lymphs Abs: 2.6 10*3/uL (ref 0.7–4.0)
MCH: 23.1 pg — ABNORMAL LOW (ref 26.0–34.0)
MCHC: 31 g/dL (ref 30.0–36.0)
MCV: 74.6 fL — ABNORMAL LOW (ref 80.0–100.0)
Monocytes Absolute: 0.9 10*3/uL (ref 0.1–1.0)
Monocytes Relative: 10 %
Neutro Abs: 4.4 10*3/uL (ref 1.7–7.7)
Neutrophils Relative %: 53 %
Platelets: 233 10*3/uL (ref 150–400)
RBC: 3.98 MIL/uL (ref 3.87–5.11)
RDW: 17.8 % — ABNORMAL HIGH (ref 11.5–15.5)
WBC: 8.2 10*3/uL (ref 4.0–10.5)
nRBC: 0 % (ref 0.0–0.2)

## 2020-10-15 LAB — COMPREHENSIVE METABOLIC PANEL
ALT: 22 U/L (ref 0–44)
AST: 24 U/L (ref 15–41)
Albumin: 2.4 g/dL — ABNORMAL LOW (ref 3.5–5.0)
Alkaline Phosphatase: 52 U/L (ref 38–126)
Anion gap: 6 (ref 5–15)
BUN: <5 mg/dL — ABNORMAL LOW (ref 8–23)
CO2: 25 mmol/L (ref 22–32)
Calcium: 7.9 mg/dL — ABNORMAL LOW (ref 8.9–10.3)
Chloride: 110 mmol/L (ref 98–111)
Creatinine, Ser: 0.53 mg/dL (ref 0.44–1.00)
GFR, Estimated: >60 mL/min (ref >60)
Glucose, Bld: 147 mg/dL — ABNORMAL HIGH (ref 70–99)
Potassium: 2.9 mmol/L — ABNORMAL LOW (ref 3.5–5.1)
Sodium: 141 mmol/L (ref 135–145)
Total Bilirubin: 0.1 mg/dL — ABNORMAL LOW (ref 0.3–1.2)
Total Protein: 5.2 g/dL — ABNORMAL LOW (ref 6.5–8.1)

## 2020-10-15 LAB — GLUCOSE, CAPILLARY
Glucose-Capillary: 145 mg/dL — ABNORMAL HIGH (ref 70–99)
Glucose-Capillary: 100 mg/dL — ABNORMAL HIGH (ref 70–99)
Glucose-Capillary: 140 mg/dL — ABNORMAL HIGH (ref 70–99)
Glucose-Capillary: 127 mg/dL — ABNORMAL HIGH (ref 70–99)

## 2020-10-15 LAB — MAGNESIUM: Magnesium: 1.7 mg/dL (ref 1.7–2.4)

## 2020-10-15 LAB — CK: Total CK: 589 U/L — ABNORMAL HIGH (ref 38–234)

## 2020-10-15 LAB — PHOSPHORUS: Phosphorus: 3.2 mg/dL (ref 2.5–4.6)

## 2020-10-15 MED ORDER — POTASSIUM CHLORIDE CRYS ER 20 MEQ PO TBCR
40.0000 meq | EXTENDED_RELEASE_TABLET | ORAL | Status: AC
  Administered 2020-10-15 (×2): 40 meq via ORAL
  Filled 2020-10-15 (×2): qty 2

## 2020-10-15 NOTE — NC FL2 (Signed)
[place] MEDICAID FL2 LEVEL OF CARE SCREENING TOOL     IDENTIFICATION  Patient Name: Amanda Morris Birthdate: 1950-05-09 Sex: female Admission Date (Current Location): 10/11/2020  [facility] and [place] Number:  Herbalist and Address:  [facility], [place] [address] [place], [place] [address]      Provider Number: [id number]  Attending Physician Name and Address:  Mariel Aloe, MD  Relative Name and Phone Number:       Current Level of Care: Hospital Recommended Level of Care: [place] Prior Approval Number:    Date Approved/Denied:   PASRR Number: [id number]  Discharge Plan: SNF    Current Diagnoses: Patient Active Problem List   Diagnosis Date Noted   Rhabdomyolysis 10/14/2020   Fall 10/12/2020   Elevated CK 10/12/2020   Spinal stenosis 10/09/2020   Lumbar adjacent segment disease with spondylolisthesis 10/09/2020   Spinal stenosis, lumbar region with neurogenic claudication 04/16/2020   Peripheral neuropathy 11/28/2019   Paresthesia and pain of both upper extremities 11/12/2019   Chronic midline low back pain with bilateral sciatica 10/30/2019   Left thigh pain 10/30/2019   Numbness 10/30/2019   Meralgia paresthetica, left 09/12/2019   [place] Must Quervain's disease (tenosynovitis) 02/06/2019   Acute bilateral low back pain without sciatica 12/01/2018   Orthostatic hypotension 12/01/2018   Failed back syndrome of lumbar spine 11/25/2017   Fatty liver 09/27/2016   Status post bilateral breast reduction 03/23/2016   Spondylolisthesis of lumbar region 06/13/2015   History of lumbar fusion 04/07/2015   Radiculopathy, lumbar region 04/07/2015   Essential hypertension 02/14/2015   Postprandial vomiting 02/04/2014   Obesity (BMI 30-39.9) 11/01/2013   Carotid artery stenosis 06/07/2013   Hereditary and idiopathic peripheral neuropathy 06/07/2013   History of migraine 05/04/2013   Type 2 diabetes mellitus with  other specified complication ([place]) 56/21/3086   SVT (supraventricular tachycardia) ([place]) 11/03/2010   GERD (gastroesophageal reflux disease) 10/29/2010   Chronic low back pain 10/29/2010   Hyperlipidemia associated with type 2 diabetes mellitus ([place]) 10/29/2010    Orientation RESPIRATION BLADDER Height & Weight     Self, Time, Situation, Place  (P) Normal (P) Continent Weight: 220 lb 7.4 oz (100 kg) Height:  5\' 4"  (162.6 cm)  BEHAVIORAL SYMPTOMS/MOOD NEUROLOGICAL BOWEL NUTRITION STATUS      (P) Continent (P) Diet (refer to d/c summary)  AMBULATORY STATUS COMMUNICATION OF NEEDS Skin   (P) Extensive Assist   (P) Normal                       Personal Care Assistance Level of Assistance  Bathing, Feeding, Dressing Bathing Assistance: (P) Maximum assistance Feeding assistance: (P) Independent Dressing Assistance: (P) Maximum assistance     Functional Limitations Info  (P) Sight, Hearing, Speech Sight Info: (P) Adequate Hearing Info: (P) Adequate Speech Info: (P) Adequate    SPECIAL CARE FACTORS FREQUENCY  (P) PT (By licensed PT), OT (By licensed OT)     PT Frequency: (P) 5x/week , evaluate and treat              Contractures      Additional Factors Info                  Current Medications (10/15/2020):  This is the current hospital active medication list Current Facility-Administered Medications  Medication Dose Route Frequency Provider Last Rate Last Admin   acetaminophen (TYLENOL) tablet 650 mg  650 mg Oral Q6H PRN  Doran Heater, DO       Or   acetaminophen (TYLENOL) suppository 650 mg  650 mg Rectal Q6H PRN MacNeil, Richard G, DO       albuterol (PROVENTIL) (2.5 MG/3ML) 0.083% nebulizer solution 2.5 mg  2.5 mg Nebulization Q6H PRN MacNeil, Richard G, DO       dextrose 5 %-0.9 % sodium chloride infusion   Intravenous Continuous Allie Bossier, MD 125 mL/hr at 10/15/20 1007 New Bag at 10/15/20 1007   enoxaparin (LOVENOX) injection 40 mg  40 mg  Subcutaneous Q24H Doran Heater, DO   40 mg at 10/15/20 1009   fentaNYL (DURAGESIC) 50 MCG/HR 1 patch  1 patch Transdermal Q72H Allie Bossier, MD   1 patch at 10/14/20 2035   HYDROcodone-acetaminophen (NORCO/VICODIN) 5-325 MG per tablet 1-2 tablet  1-2 tablet Oral Q4H PRN Doran Heater, DO   2 tablet at 10/14/20 1440   insulin aspart (novoLOG) injection 0-15 Units  0-15 Units Subcutaneous TID WC Doran Heater, DO   2 Units at 10/15/20 0830   insulin aspart (novoLOG) injection 0-5 Units  0-5 Units Subcutaneous QHS MacNeil, Richard G, DO       ondansetron [facility]) tablet 4 mg  4 mg Oral Q6H PRN Allie Bossier, MD   4 mg at 10/14/20 2038   Or   ondansetron (ZOFRAN) injection 4 mg  4 mg Intravenous Q6H PRN Allie Bossier, MD       pregabalin (LYRICA) capsule 200 mg  200 mg Oral TID Allie Bossier, MD   200 mg at 10/15/20 1010     Discharge Medications: Please see discharge summary for a list of discharge medications.  Relevant Imaging Results:  Relevant Lab Results:   Additional Information    Collis Thede F Chaeli Judy, LCSWA

## 2020-10-15 NOTE — TOC Progression Note (Signed)
Transition of Care [facility]) - Progression Note    Patient Details  Name: Amanda Morris MRN: [id number] Date of Birth: 12-Dec-1950  [facility] CM/SW Contact  Milinda Antis, LCSWA Phone Number: 10/15/2020, 3:37 PM  Clinical Narrative:    CSW spoke with [facility]'s Admission Director [place], Phone # [phone].  The facility does not have any female beds available today, but may have a bed available tomorrow.    CSW faxed referral to [facility]  in [place].    CSW called admissions with [facility] and there was no answer.  A returned phone call was requested.   Expected Discharge Plan: Skilled Nursing Facility Barriers to Discharge: Continued Medical Work up, SNF Pending bed offer  Expected Discharge Plan and Services Expected Discharge Plan: [place] In-house Referral: Clinical Social Work     Living arrangements for the past 2 months: Apartment                                       Social Determinants of Health (SDOH) Interventions    Readmission Risk Interventions No flowsheet data found.

## 2020-10-15 NOTE — Progress Notes (Signed)
PROGRESS NOTE    Anab Vivar  YBO:[id number] DOB: May 01, 1950 DOA: 10/11/2020 PCP: Hali Marry, MD   Brief Narrative: Amanda Morris is a 70 y.o. female with a history of recent elective back surgery on 10/09/2020, hypertension, diabetes mellitus type 2, hyperlipidemia.  Patient presented secondary to being found down after a fall from lower extremity weakness.  When she presented she is found to have severe back pain in addition to evidence of rhabdomyolysis.  Neurosurgery was consulted on admission with no need/recommendation for surgical intervention.  Managing patient's pain as an inpatient with plan for discharge to SNF.    Assessment & Plan:   Active Problems:   Chronic low back pain   Hyperlipidemia associated with type 2 diabetes mellitus ([place])   Type 2 diabetes mellitus with other specified complication (HCC)   Obesity (BMI 30-39.9)   Essential hypertension   Spinal stenosis   Fall   Elevated CK   Rhabdomyolysis   Back pain In setting of recent fall and history of recent L5-S1 decompression and lumbar fusion.  Neurosurgery was consulted on admission.  No need for surgical intervention.  Pain management was achieved with fentanyl patch.  Patient has been getting Norco as well even though at bedside she told me that Norco causes her significant GI upset.  Patient states that her pain is not controlled but really she just has episodic pain symptoms.  Pain does not appear to be limiting her function.  Preference would be to discontinue fentanyl patch but patient has not eager to trial Percocet.  PT/OT recommend SNF. -Continue fentanyl patch -Continue Norco as needed -Continue Lyrica 200 mg 3 times daily  Fall In setting of recent lumbar surgery.  Patient was also not using her walker as an outpatient.  PT/OT recommending SNF on discharge.  Rhabdomyolysis CK on admission of 1700.  No associated AKI.  Started on IV fluids with improvement of CK.   Resolved.  Hypokalemia -K-Dur 40 mill equivalents x2  Diabetes mellitus, type II Hemoglobin A1c of 6.8%.  Patient is on glipizide, Jardiance as an outpatient. -Continue carb modified diet -Continue sliding scale insulin  Hyperlipidemia Patient is on pravastatin as an outpatient.  This was held secondary to rhabdomyolysis and elevated CK.  Obesity Body mass index is 37.84 kg/m.   DVT prophylaxis: Lovenox Code Status:   Code Status: Full Code Family Communication: None at bedside Disposition Plan: Discharge to SNF when bed is available.  Patient is medically stable for discharge.   Consultants:  Neurosurgery  Procedures:  None  Antimicrobials: None   Subjective: Patient reports back pain is episodic and severe.  Was able to ambulate from her bed to the bathroom and from the bathroom to the chair without significant pain.  Objective: Vitals:   10/14/20 1837 10/14/20 2035 10/15/20 0835 10/15/20 1151  BP: (!) 144/94 125/64 116/71 116/78  Pulse: 83 84 90 86  Resp: 20 16  17   Temp: 98.6 F (37 C) 99.7 F (37.6 C) 99 F (37.2 C) 98.9 F (37.2 C)  TempSrc: Oral Oral Oral Oral  SpO2: 95% 99% 96% 95%  Weight:      Height:        Intake/Output Summary (Last 24 hours) at 10/15/2020 1623 Last data filed at 10/15/2020 0816 Gross per 24 hour  Intake 240 ml  Output 450 ml  Net -210 ml   Filed Weights   10/11/20 1937  Weight: 100 kg    Examination:  General exam: Appears  calm and comfortable Respiratory system: Clear to auscultation. Respiratory effort normal. Cardiovascular system: S1 & S2 heard, RRR. No murmurs, rubs, gallops or clicks. Gastrointestinal system: Abdomen is nondistended, soft and nontender. No organomegaly or masses felt. Normal bowel sounds heard. Central nervous system: Alert and oriented. No focal neurological deficits. Musculoskeletal: No edema. No calf tenderness Skin: No cyanosis. No rashes Psychiatry: Judgement and insight appear normal.  Mood & affect appropriate.     Data Reviewed: I have personally reviewed following labs and imaging studies  CBC Lab Results  Component Value Date   WBC 8.2 10/15/2020   RBC 3.98 10/15/2020   HGB 9.2 (L) 10/15/2020   HCT 29.7 (L) 10/15/2020   MCV 74.6 (L) 10/15/2020   MCH 23.1 (L) 10/15/2020   PLT 233 10/15/2020   MCHC 31.0 10/15/2020   RDW 17.8 (H) 10/15/2020   LYMPHSABS 2.6 10/15/2020   MONOABS 0.9 10/15/2020   EOSABS 0.3 10/15/2020   BASOSABS 0.1 50/53/9767     Last metabolic panel Lab Results  Component Value Date   NA 141 10/15/2020   K 2.9 (L) 10/15/2020   CL 110 10/15/2020   CO2 25 10/15/2020   BUN <5 (L) 10/15/2020   CREATININE 0.53 10/15/2020   GLUCOSE 147 (H) 10/15/2020   GFRNONAA >60 10/15/2020   GFRAA 99 11/28/2019   CALCIUM 7.9 (L) 10/15/2020   PHOS 3.2 10/15/2020   PROT 5.2 (L) 10/15/2020   ALBUMIN 2.4 (L) 10/15/2020   LABGLOB 3.3 11/28/2019   LABGLOB 2.7 11/28/2019   AGRATIO 1.4 11/28/2019   BILITOT 0.1 (L) 10/15/2020   ALKPHOS 52 10/15/2020   AST 24 10/15/2020   ALT 22 10/15/2020   ANIONGAP 6 10/15/2020    CBG (last 3)  Recent Labs    10/14/20 2015 10/15/20 0627 10/15/20 1150  GLUCAP 157* 145* 100*     GFR: Estimated Creatinine Clearance: 77.4 mL/min (by C-G formula based on SCr of 0.53 mg/dL).  Coagulation Profile: No results for input(s): INR, PROTIME in the last 168 hours.  Recent Results (from the past 240 hour(s))  Surgical pcr screen     Status: None   Collection Time: 10/07/20  8:13 AM   Specimen: Nasal Mucosa; Nasal Swab  Result Value Ref Range Status   MRSA, PCR NEGATIVE NEGATIVE Final   Staphylococcus aureus NEGATIVE NEGATIVE Final    Comment: (NOTE) The Xpert SA Assay (FDA approved for NASAL specimens in patients 59 years of age and older), is one component of a comprehensive surveillance program. It is not intended to diagnose infection nor to guide or monitor treatment. Performed at [facility],  [place] [address], [place], [place] [address]   SARS CORONAVIRUS 2 (TAT 6-24 HRS) Nasopharyngeal Nasopharyngeal Swab     Status: None   Collection Time: 10/07/20  8:54 AM   Specimen: Nasopharyngeal Swab  Result Value Ref Range Status   SARS Coronavirus 2 NEGATIVE NEGATIVE Final    Comment: (NOTE) SARS-CoV-2 target nucleic acids are NOT DETECTED.  The SARS-CoV-2 RNA is generally detectable in upper and lower respiratory specimens during the acute phase of infection. Negative results do not preclude SARS-CoV-2 infection, do not rule out co-infections with other pathogens, and should not be used as the sole basis for treatment or other patient management decisions. Negative results must be combined with clinical observations, patient history, and epidemiological information. The expected result is Negative.  Fact Sheet for Patients: SugarRoll.be  Fact Sheet for Healthcare Providers: https://www.woods-mathews.com/  This test is not yet  approved or cleared by the [place] and  has been authorized for detection and/or diagnosis of SARS-CoV-2 by FDA under an Emergency Use Authorization (EUA). This EUA will remain  in effect (meaning this test can be used) for the duration of the COVID-19 declaration under Se ction 564(b)(1) of the Act, 21 U.S.C. section 360bbb-3(b)(1), unless the authorization is terminated or revoked sooner.  Performed at [facility], [place] [address], [place], [place] [address]   Urine culture     Status: Abnormal   Collection Time: 10/11/20  9:10 PM   Specimen: Urine, Random  Result Value Ref Range Status   Specimen Description URINE, RANDOM  Final   Special Requests NONE  Final   Culture (A)  Final    <10,000 COLONIES/mL INSIGNIFICANT GROWTH Performed at [facility], [place] [address], [place], [place] [address]    Report Status 10/13/2020 FINAL  Final  SARS CORONAVIRUS 2 (TAT 6-24 HRS) Nasopharyngeal  Nasopharyngeal Swab     Status: None   Collection Time: 10/12/20  2:30 AM   Specimen: Nasopharyngeal Swab  Result Value Ref Range Status   SARS Coronavirus 2 NEGATIVE NEGATIVE Final    Comment: (NOTE) SARS-CoV-2 target nucleic acids are NOT DETECTED.  The SARS-CoV-2 RNA is generally detectable in upper and lower respiratory specimens during the acute phase of infection. Negative results do not preclude SARS-CoV-2 infection, do not rule out co-infections with other pathogens, and should not be used as the sole basis for treatment or other patient management decisions. Negative results must be combined with clinical observations, patient history, and epidemiological information. The expected result is Negative.  Fact Sheet for Patients: SugarRoll.be  Fact Sheet for Healthcare Providers: https://www.woods-mathews.com/  This test is not yet approved or cleared by the [place] FDA and  has been authorized for detection and/or diagnosis of SARS-CoV-2 by FDA under an Emergency Use Authorization (EUA). This EUA will remain  in effect (meaning this test can be used) for the duration of the COVID-19 declaration under Se ction 564(b)(1) of the Act, 21 U.S.C. section 360bbb-3(b)(1), unless the authorization is terminated or revoked sooner.  Performed at [facility], [place] [address] [place], [place] [address]         Radiology Studies: No results found.      Scheduled Meds:  enoxaparin (LOVENOX) injection  40 mg Subcutaneous Q24H   fentaNYL  1 patch Transdermal Q72H   insulin aspart  0-15 Units Subcutaneous TID WC   insulin aspart  0-5 Units Subcutaneous QHS   pregabalin  200 mg Oral TID   Continuous Infusions:  dextrose 5 % and 0.9% NaCl 125 mL/hr at 10/15/20 1007     LOS: 1 day     Cordelia Poche, MD [facility] 10/15/2020, 4:23 PM  If 7PM-7AM, please contact night-coverage [email or web address]

## 2020-10-15 NOTE — Progress Notes (Signed)
Physical Therapy Treatment Patient Details Name: Amanda Morris MRN: [id number] DOB: 1951/02/18 Today's Date: 10/15/2020    History of Present Illness Pt is a 70 y/o female presenting on 6/18 to the ER with back pain after being found down. Of note pt with recent hx of lumbar surgery 6/16 with L5 and S1 decompression and fusion. CXR and CT lumbar showed no acute processes. PMH: insulin-dependent type 2 diabetes, dyslipidemia, hypertension, chronic back pain.    PT Comments    Pt standing in room with RN on arrival.  PT took over session.  Focused on gt training and LE exercises in supine position.  Pt fatigues with activity and required AAROM to complete exercises.  She continues to benefit from strengthening and progression of mobility before returning home.    Follow Up Recommendations  SNF     Equipment Recommendations       Recommendations for Other Services       Precautions / Restrictions Precautions Precautions: Back Precaution Booklet Issued: No Precaution Comments: Pt able to recall 3/3 spinal precautions this session. Restrictions Weight Bearing Restrictions: No Other Position/Activity Restrictions: do not lift more than 10 lbs    Mobility  Bed Mobility Overal bed mobility: Needs Assistance Bed Mobility: Rolling;Sit to Sidelying Rolling: Min guard       Sit to sidelying: Min guard General bed mobility comments: Cues for log rolling and safety to maintain spinal precautions.    Transfers Overall transfer level: Needs assistance Equipment used: Rolling walker (2 wheeled) Transfers: Sit to/from Stand Sit to Stand: Min guard         General transfer comment: Cues for hand placement to and from seated surface.  Ambulation/Gait Ambulation/Gait assistance: Min guard Gait Distance (Feet): 75 Feet Assistive device: Rolling walker (2 wheeled) Gait Pattern/deviations: Trunk flexed;Step-through pattern;Decreased stride length Gait velocity: reduced    General Gait Details: Continued cues for posture and RW safety.  Foot clearance is much improved.  No c/o dizziness this session.   Stairs             Wheelchair Mobility    Modified Rankin (Stroke Patients Only)       Balance Overall balance assessment: Needs assistance Sitting-balance support: Feet supported;No upper extremity supported Sitting balance-Leahy Scale: Good       Standing balance-Leahy Scale: Poor Standing balance comment: reliant on BUE support for balance                            Cognition Arousal/Alertness: Awake/alert Behavior During Therapy: WFL for tasks assessed/performed Overall Cognitive Status: Within Functional Limits for tasks assessed                                        Exercises General Exercises - Lower Extremity Ankle Circles/Pumps: AROM;Both;15 reps;Supine Quad Sets: AROM;Both;10 reps;Supine Heel Slides: AROM;Both;10 reps;Supine;AAROM Hip ABduction/ADduction: AROM;Both;10 reps;Supine;AAROM    General Comments        Pertinent Vitals/Pain Pain Assessment: Faces Pain Score: 6  Faces Pain Scale: Hurts a little bit Pain Location: lower back Pain Descriptors / Indicators: Aching Pain Intervention(s): Monitored during session;Repositioned    Home Living                      Prior Function            PT Goals (current goals  can now be found in the care plan section) Acute Rehab PT Goals Patient Stated Goal: go to SNF to get stronger and then go home Potential to Achieve Goals: Good Progress towards PT goals: Progressing toward goals    Frequency    Min 3X/week      PT Plan Current plan remains appropriate    Co-evaluation              AM-PAC PT "6 Clicks" Mobility   Outcome Measure  Help needed turning from your back to your side while in a flat bed without using bedrails?: A Little Help needed moving from lying on your back to sitting on the side of a flat bed  without using bedrails?: A Little Help needed moving to and from a bed to a chair (including a wheelchair)?: A Little Help needed standing up from a chair using your arms (e.g., wheelchair or bedside chair)?: A Little Help needed to walk in hospital room?: A Little Help needed climbing 3-5 steps with a railing? : A Little 6 Click Score: 18    End of Session Equipment Utilized During Treatment: Gait belt Activity Tolerance: Patient limited by pain Patient left: with call bell/phone within reach;in chair;with chair alarm set Nurse Communication: Mobility status PT Visit Diagnosis: Other abnormalities of gait and mobility (R26.89);Pain;Unsteadiness on feet (R26.81);Muscle weakness (generalized) (M62.81);History of falling (Z91.81) Pain - part of body: Hip (back)     Time: 1427-6701 PT Time Calculation (min) (ACUTE ONLY): 16 min  Charges:  $Gait Training: 8-22 mins                     Erasmo Leventhal , PTA Acute Rehabilitation Services Pager [phone] Office [phone]    Joanthan Hlavacek Eli Hose 10/15/2020, 2:45 PM

## 2020-10-16 LAB — BASIC METABOLIC PANEL
Anion gap: 6 (ref 5–15)
BUN: <5 mg/dL — ABNORMAL LOW (ref 8–23)
CO2: 23 mmol/L (ref 22–32)
Calcium: 8.1 mg/dL — ABNORMAL LOW (ref 8.9–10.3)
Chloride: 113 mmol/L — ABNORMAL HIGH (ref 98–111)
Creatinine, Ser: 0.56 mg/dL (ref 0.44–1.00)
GFR, Estimated: >60 mL/min (ref >60)
Glucose, Bld: 105 mg/dL — ABNORMAL HIGH (ref 70–99)
Potassium: 3.4 mmol/L — ABNORMAL LOW (ref 3.5–5.1)
Sodium: 142 mmol/L (ref 135–145)

## 2020-10-16 LAB — GLUCOSE, CAPILLARY
Glucose-Capillary: 114 mg/dL — ABNORMAL HIGH (ref 70–99)
Glucose-Capillary: 133 mg/dL — ABNORMAL HIGH (ref 70–99)
Glucose-Capillary: 138 mg/dL — ABNORMAL HIGH (ref 70–99)
Glucose-Capillary: 88 mg/dL (ref 70–99)
Glucose-Capillary: 107 mg/dL — ABNORMAL HIGH (ref 70–99)

## 2020-10-16 MED ORDER — POTASSIUM CHLORIDE CRYS ER 20 MEQ PO TBCR
40.0000 meq | EXTENDED_RELEASE_TABLET | ORAL | Status: AC
  Administered 2020-10-16 (×2): 40 meq via ORAL
  Filled 2020-10-16 (×2): qty 2

## 2020-10-16 NOTE — Progress Notes (Signed)
Patient ID: Amanda Morris, female   DOB: [birth year], 70 y.o.   MRN: [id number] BP 108/72 (BP Location: Right Arm)   Pulse 85   Temp 98.1 F (36.7 C)   Resp 18   Ht 5\' 4"  (1.626 m)   Wt 100 kg   LMP  (LMP Unknown)   SpO2 97%   BMI 37.84 kg/m  Alert oriented x4, speech is clear and fluent Moving all extremities well Working well with PT Plan on SNF on Saturday

## 2020-10-16 NOTE — Progress Notes (Signed)
PROGRESS NOTE    [name]  PNT:[id number] DOB: 23-Mar-1951 DOA: 10/11/2020 PCP: Hali Marry, MD   Brief Narrative: Amanda Morris is a 70 y.o. female with a history of recent elective back surgery on 10/09/2020, hypertension, diabetes mellitus type 2, hyperlipidemia.  Patient presented secondary to being found down after a fall from lower extremity weakness.  When she presented she is found to have severe back pain in addition to evidence of rhabdomyolysis.  Neurosurgery was consulted on admission with no need/recommendation for surgical intervention.  Managing patient's pain as an inpatient with plan for discharge to SNF.    Assessment & Plan:   Active Problems:   Chronic low back pain   Hyperlipidemia associated with type 2 diabetes mellitus ([place])   Type 2 diabetes mellitus with other specified complication (HCC)   Obesity (BMI 30-39.9)   Essential hypertension   Spinal stenosis   Fall   Elevated CK   Rhabdomyolysis   Back pain In setting of recent fall and history of recent L5-S1 decompression and lumbar fusion.  Neurosurgery was consulted on admission.  No need for surgical intervention.  Pain management was achieved with fentanyl patch secondary to allergy/intolerance history with hydrocodone and oxycodone.  She recently received Norco with no issues noted.  -Continue fentanyl patch -Continue Lyrica 200 mg 3 times daily  Fall In setting of recent lumbar surgery.  Patient was also not using her walker as an outpatient.  PT/OT recommending SNF on discharge.  Rhabdomyolysis CK on admission of 1700.  No associated AKI.  Started on IV fluids with improvement of CK.  Resolved.  Hypokalemia Improved with supplementation. Slightly low today. -Kdur 40 meq x2  Diabetes mellitus, type II Hemoglobin A1c of 6.8%.  Patient is on glipizide, Jardiance as an outpatient. -Continue carb modified diet -Continue sliding scale  insulin  Hyperlipidemia Patient is on pravastatin as an outpatient.  This was held secondary to rhabdomyolysis and elevated CK.  Obesity Body mass index is 37.84 kg/m.   DVT prophylaxis: Lovenox Code Status:   Code Status: Full Code Family Communication: None at bedside Disposition Plan: Discharge to SNF when bed is available.  Patient is medically stable for discharge.   Consultants:  Neurosurgery  Procedures:  None  Antimicrobials: None   Subjective: Pain is manageable today. No other concerns.  Objective: Vitals:   10/14/20 2035 10/15/20 0835 10/15/20 1151 10/15/20 2226  BP: 125/64 116/71 116/78 102/81  Pulse: 84 90 86 87  Resp: 16  17   Temp: 99.7 F (37.6 C) 99 F (37.2 C) 98.9 F (37.2 C) 98.4 F (36.9 C)  TempSrc: Oral Oral Oral Oral  SpO2: 99% 96% 95% 99%  Weight:      Height:       No intake or output data in the 24 hours ending 10/16/20 1046  Filed Weights   10/11/20 1937  Weight: 100 kg    Examination:  General exam: Appears calm and comfortable Respiratory system: Clear to auscultation. Respiratory effort normal. Cardiovascular system: S1 & S2 heard, RRR. No murmurs, rubs, gallops or clicks. Gastrointestinal system: Abdomen is nondistended, soft and nontender. No organomegaly or masses felt. Normal bowel sounds heard. Central nervous system: Alert and oriented. No focal neurological deficits. Musculoskeletal: No edema. No calf tenderness Skin: No cyanosis. No rashes Psychiatry: Judgement and insight appear normal. Mood & affect appropriate.     Data Reviewed: I have personally reviewed following labs and imaging studies  CBC Lab Results  Component  Value Date   WBC 8.2 10/15/2020   RBC 3.98 10/15/2020   HGB 9.2 (L) 10/15/2020   HCT 29.7 (L) 10/15/2020   MCV 74.6 (L) 10/15/2020   MCH 23.1 (L) 10/15/2020   PLT 233 10/15/2020   MCHC 31.0 10/15/2020   RDW 17.8 (H) 10/15/2020   LYMPHSABS 2.6 10/15/2020   MONOABS 0.9 10/15/2020    EOSABS 0.3 10/15/2020   BASOSABS 0.1 02/54/2706     Last metabolic panel Lab Results  Component Value Date   NA 142 10/16/2020   K 3.4 (L) 10/16/2020   CL 113 (H) 10/16/2020   CO2 23 10/16/2020   BUN <5 (L) 10/16/2020   CREATININE 0.56 10/16/2020   GLUCOSE 105 (H) 10/16/2020   GFRNONAA >60 10/16/2020   GFRAA 99 11/28/2019   CALCIUM 8.1 (L) 10/16/2020   PHOS 3.2 10/15/2020   PROT 5.2 (L) 10/15/2020   ALBUMIN 2.4 (L) 10/15/2020   LABGLOB 3.3 11/28/2019   LABGLOB 2.7 11/28/2019   AGRATIO 1.4 11/28/2019   BILITOT 0.1 (L) 10/15/2020   ALKPHOS 52 10/15/2020   AST 24 10/15/2020   ALT 22 10/15/2020   ANIONGAP 6 10/16/2020    CBG (last 3)  Recent Labs    10/15/20 1956 10/16/20 0615 10/16/20 0727  GLUCAP 127* 114* 133*      GFR: Estimated Creatinine Clearance: 77.4 mL/min (by C-G formula based on SCr of 0.56 mg/dL).  Coagulation Profile: No results for input(s): INR, PROTIME in the last 168 hours.  Recent Results (from the past 240 hour(s))  Surgical pcr screen     Status: None   Collection Time: 10/07/20  8:13 AM   Specimen: Nasal Mucosa; Nasal Swab  Result Value Ref Range Status   MRSA, PCR NEGATIVE NEGATIVE Final   Staphylococcus aureus NEGATIVE NEGATIVE Final    Comment: (NOTE) The Xpert SA Assay (FDA approved for NASAL specimens in patients 3 years of age and older), is one component of a comprehensive surveillance program. It is not intended to diagnose infection nor to guide or monitor treatment. Performed at [facility], [place] [address], [place], [place] [address]   SARS CORONAVIRUS 2 (TAT 6-24 HRS) Nasopharyngeal Nasopharyngeal Swab     Status: None   Collection Time: 10/07/20  8:54 AM   Specimen: Nasopharyngeal Swab  Result Value Ref Range Status   SARS Coronavirus 2 NEGATIVE NEGATIVE Final    Comment: (NOTE) SARS-CoV-2 target nucleic acids are NOT DETECTED.  The SARS-CoV-2 RNA is generally detectable in upper and lower respiratory  specimens during the acute phase of infection. Negative results do not preclude SARS-CoV-2 infection, do not rule out co-infections with other pathogens, and should not be used as the sole basis for treatment or other patient management decisions. Negative results must be combined with clinical observations, patient history, and epidemiological information. The expected result is Negative.  Fact Sheet for Patients: SugarRoll.be  Fact Sheet for Healthcare Providers: https://www.woods-mathews.com/  This test is not yet approved or cleared by the [place] FDA and  has been authorized for detection and/or diagnosis of SARS-CoV-2 by FDA under an Emergency Use Authorization (EUA). This EUA will remain  in effect (meaning this test can be used) for the duration of the COVID-19 declaration under Se ction 564(b)(1) of the Act, 21 U.S.C. section 360bbb-3(b)(1), unless the authorization is terminated or revoked sooner.  Performed at [facility], [place] [address], [place], [place] [address]   Urine culture     Status: Abnormal  Collection Time: 10/11/20  9:10 PM   Specimen: Urine, Random  Result Value Ref Range Status   Specimen Description URINE, RANDOM  Final   Special Requests NONE  Final   Culture (A)  Final    <10,000 COLONIES/mL INSIGNIFICANT GROWTH Performed at [facility], [address], [place], [place] [address]    Report Status 10/13/2020 FINAL  Final  SARS CORONAVIRUS 2 (TAT 6-24 HRS) Nasopharyngeal Nasopharyngeal Swab     Status: None   Collection Time: 10/12/20  2:30 AM   Specimen: Nasopharyngeal Swab  Result Value Ref Range Status   SARS Coronavirus 2 NEGATIVE NEGATIVE Final    Comment: (NOTE) SARS-CoV-2 target nucleic acids are NOT DETECTED.  The SARS-CoV-2 RNA is generally detectable in upper and lower respiratory specimens during the acute phase of infection. Negative results do not preclude SARS-CoV-2  infection, do not rule out co-infections with other pathogens, and should not be used as the sole basis for treatment or other patient management decisions. Negative results must be combined with clinical observations, patient history, and epidemiological information. The expected result is Negative.  Fact Sheet for Patients: SugarRoll.be  Fact Sheet for Healthcare Providers: https://www.woods-mathews.com/  This test is not yet approved or cleared by the [place] FDA and  has been authorized for detection and/or diagnosis of SARS-CoV-2 by FDA under an Emergency Use Authorization (EUA). This EUA will remain  in effect (meaning this test can be used) for the duration of the COVID-19 declaration under Se ction 564(b)(1) of the Act, 21 U.S.C. section 360bbb-3(b)(1), unless the authorization is terminated or revoked sooner.  Performed at [facility], [place] [address] [place], [place] [address]          Radiology Studies: No results found.      Scheduled Meds:  enoxaparin (LOVENOX) injection  40 mg Subcutaneous Q24H   fentaNYL  1 patch Transdermal Q72H   insulin aspart  0-15 Units Subcutaneous TID WC   insulin aspart  0-5 Units Subcutaneous QHS   potassium chloride  40 mEq Oral Q4H   pregabalin  200 mg Oral TID   Continuous Infusions:  dextrose 5 % and 0.9% NaCl 125 mL/hr at 10/15/20 1007     LOS: 2 days     Cordelia Poche, MD [facility] 10/16/2020, 10:46 AM  If 7PM-7AM, please contact night-coverage [email or web address]

## 2020-10-16 NOTE — TOC Progression Note (Addendum)
Transition of Care [facility]) - Progression Note    Patient Details  Name: Amanda Morris MRN: [id number] Date of Birth: Sep 17, 1950  [facility]) CM/SW Contact  Milinda Antis, LCSWA Phone Number: 10/16/2020, 2:42 PM  Clinical Narrative:     CSW contacted the following agencies to check on bed availability:  [place]- Referral was sent  [facility] -  No availability  [place] called numerous times.  Unable to get a live person and unable to leave a VM.  [place] spoke with Baird Lyons at [facility] and faxed the referral to facility.  Ms. Olena Heckle informed CSW that they will have a bed available on Saturday and that they can accept the patient.  CSW called the patient's family (niece Amanda Morris) and confirmed that the family is would like for the  patient to go to the facility.  The niece will inform the patient and other family members of the bed offer.  CSW contacted Ms. Olena Heckle with [facility] and accepted the bed offer.  [phone].  The facility accepts patients on Saturday as long as the d/c summary is received before 10 am.    A COVID test will be needed.    PASRR # [id number]    Expected Discharge Plan: [place] Barriers to Discharge: Continued Medical Work up, SNF Pending bed offer  Expected Discharge Plan and Services Expected Discharge Plan: [place] In-house Referral: Clinical Social Work     Living arrangements for the past 2 months: Apartment                                       Social Determinants of Health (SDOH) Interventions    Readmission Risk Interventions No flowsheet data found.

## 2020-10-16 NOTE — Progress Notes (Signed)
Physical Therapy Treatment Patient Details Name: Amanda Morris MRN: [id number] DOB: 1950/07/19 Today's Date: 10/16/2020    History of Present Illness Pt is a 70 y/o female presenting on 6/18 to the ER with back pain after being found down. Of note pt with recent hx of lumbar surgery 6/16 with L5 and S1 decompression and fusion. CXR and CT lumbar showed no acute processes. PMH: insulin-dependent type 2 diabetes, dyslipidemia, hypertension, chronic back pain.    PT Comments    Pt fully participated in session. Pt got medications right before therapy. Pt able to ambulate and perform exercises with S. Pt able to improve hand placement following cueing. Pt will benefit from skilled PT to address deficits in strength, coordination, balance, endurance and gait to maximize independence with functional mobility prior to discharge.     Follow Up Recommendations  SNF     Equipment Recommendations  Other (comment) (TBD)    Recommendations for Other Services       Precautions / Restrictions Precautions Precautions: Back Precaution Booklet Issued: No Precaution Comments: Pt able to recall 3/3 spinal precautions this session. Restrictions Weight Bearing Restrictions: No Other Position/Activity Restrictions: do not lift more than 10 lbs    Mobility  Bed Mobility       Sidelying to sit: Min guard            Transfers Overall transfer level: Needs assistance Equipment used: Rolling walker (2 wheeled) Transfers: Sit to/from Stand Sit to Stand: Supervision         General transfer comment: Cues for hand placement to and from seated surface. performex x 6 trials  Ambulation/Gait Ambulation/Gait assistance: Supervision Gait Distance (Feet): 30 Feet Assistive device: Rolling walker (2 wheeled) Gait Pattern/deviations: Trunk flexed;Step-through pattern;Decreased stride length Gait velocity: reduced       Stairs             Wheelchair Mobility    Modified  Rankin (Stroke Patients Only)       Contractor                                              Exercises General Exercises - Lower Extremity Long Arc Quad: AROM;Both;10 reps;Seated Hip Flexion/Marching: AROM;Both;10 reps;Seated    General Comments        Pertinent Vitals/Pain Pain Score: 6  Pain Location: lower back    Home Living                      Prior Function            PT Goals (current goals can now be found in the care plan section) Acute Rehab PT Goals Patient Stated Goal: go to SNF to get stronger and then go home PT Goal Formulation: With patient Time For Goal Achievement: 10/26/20 Potential to Achieve Goals: Good Progress towards PT goals: Progressing toward goals    Frequency    Min 3X/week      PT Plan Current plan remains appropriate    Co-evaluation              AM-PAC PT "6 Clicks"  Mobility   Outcome Measure  Help needed turning from your back to your side while in a flat bed without using bedrails?: None Help needed moving from lying on your back to sitting on the side of a flat bed without using bedrails?: A Little Help needed moving to and from a bed to a chair (including a wheelchair)?: A Little Help needed standing up from a chair using your arms (e.g., wheelchair or bedside chair)?: A Little Help needed to walk in hospital room?: A Little Help needed climbing 3-5 steps with a railing? : A Little 6 Click Score: 19    End of Session Equipment Utilized During Treatment: Gait belt Activity Tolerance: Patient tolerated treatment well Patient left: in chair;with call bell/phone within reach;with chair alarm set Nurse Communication: Mobility status PT Visit Diagnosis: Other abnormalities of gait and mobility (R26.89);Pain;Unsteadiness on feet (R26.81);Muscle weakness (generalized) (M62.81);History of falling (Z91.81) Pain - part of  body:  (back)     Time: 7034-0352 PT Time Calculation (min) (ACUTE ONLY): 16 min  Charges:  $Therapeutic Exercise: 8-22 mins                     Lyanne Co, DPT Acute Rehabilitation Services [phone]    [place] 10/16/2020, 10:34 AM

## 2020-10-17 LAB — SARS CORONAVIRUS 2 (TAT 6-24 HRS): SARS Coronavirus 2: NEGATIVE

## 2020-10-17 LAB — GLUCOSE, CAPILLARY
Glucose-Capillary: 115 mg/dL — ABNORMAL HIGH (ref 70–99)
Glucose-Capillary: 121 mg/dL — ABNORMAL HIGH (ref 70–99)
Glucose-Capillary: 125 mg/dL — ABNORMAL HIGH (ref 70–99)
Glucose-Capillary: 141 mg/dL — ABNORMAL HIGH (ref 70–99)

## 2020-10-17 NOTE — TOC Progression Note (Signed)
Transition of Care [facility]) - Progression Note    Patient Details  Name: Amanda Morris MRN: [id number] Date of Birth: December 31, 1950  Transition of Care [facility]) CM/SW Contact  Milinda Antis, LCSWA Phone Number: 10/17/2020, 10:18 AM  Clinical Narrative:    CSW received a call from Neoma Laming with the SNF [phone].  CSW was informed that authorization has been approved for this patient and she can arrive at the facility tomorrow.  The nurse should call report to [phone] or [phone].  The DC summary and COVID test results must be faxed to [phone] by 10am on Saturday 09/25/2020.   Expected Discharge Plan: Skilled Nursing Facility Barriers to Discharge: Continued Medical Work up, SNF Pending bed offer  Expected Discharge Plan and Services Expected Discharge Plan: [place] In-house Referral: Clinical Social Work     Living arrangements for the past 2 months: Apartment                                       Social Determinants of Health (SDOH) Interventions    Readmission Risk Interventions No flowsheet data found.

## 2020-10-17 NOTE — Progress Notes (Signed)
PROGRESS NOTE    Amanda Morris  ZOX:[id number] DOB: 04/06/1951 DOA: 10/11/2020 PCP: Hali Marry, MD   Brief Narrative: Amanda Morris is a 70 y.o. female with a history of recent elective back surgery on 10/09/2020, hypertension, diabetes mellitus type 2, hyperlipidemia.  Patient presented secondary to being found down after a fall from lower extremity weakness.  When she presented she is found to have severe back pain in addition to evidence of rhabdomyolysis.  Neurosurgery was consulted on admission with no need/recommendation for surgical intervention.  Managing patient's pain as an inpatient with plan for discharge to SNF.    Assessment & Plan:   Active Problems:   Chronic low back pain   Hyperlipidemia associated with type 2 diabetes mellitus ([place])   Type 2 diabetes mellitus with other specified complication (HCC)   Obesity (BMI 30-39.9)   Essential hypertension   Spinal stenosis   Fall   Elevated CK   Rhabdomyolysis   Back pain In setting of recent fall and history of recent L5-S1 decompression and lumbar fusion.  Neurosurgery was consulted on admission.  No need for surgical intervention.  Pain management was achieved with fentanyl patch secondary to allergy/intolerance history with hydrocodone and oxycodone.  She recently received Norco with no issues noted.  -Continue fentanyl patch -Continue Lyrica 200 mg 3 times daily  Fall In setting of recent lumbar surgery.  Patient was also not using her walker as an outpatient.  PT/OT recommending SNF on discharge.  Rhabdomyolysis CK on admission of 1700.  No associated AKI.  Started on IV fluids with improvement of CK.  Resolved.  Hypokalemia Improved with supplementation. Slightly low today. -Kdur 40 meq x2  Diabetes mellitus, type II Hemoglobin A1c of 6.8%.  Patient is on glipizide, Jardiance as an outpatient. -Continue carb modified diet -Continue sliding scale  insulin  Hyperlipidemia Patient is on pravastatin as an outpatient.  This was held secondary to rhabdomyolysis and elevated CK.  Obesity Body mass index is 37.84 kg/m.   DVT prophylaxis: Lovenox Code Status:   Code Status: Full Code Family Communication: None at bedside Disposition Plan: Discharge to SNF when bed is available.  Patient is medically stable for discharge.   Consultants:  Neurosurgery  Procedures:  None  Antimicrobials: None   Subjective: Patient's pain is still controlled well enough for her to function. No new concerns.  Objective: Vitals:   10/16/20 1341 10/16/20 2025 10/16/20 2025 10/17/20 0727  BP: 108/72 137/75 137/75 122/78  Pulse: 85 86 84 90  Resp: 18 16 16 16   Temp: 98.1 F (36.7 C) 99.1 F (37.3 C) 99.1 F (37.3 C) 98.4 F (36.9 C)  TempSrc:    Oral  SpO2: 97% 96% 96% 96%  Weight:      Height:        Intake/Output Summary (Last 24 hours) at 10/17/2020 1044 Last data filed at 10/17/2020 0549 Gross per 24 hour  Intake --  Output 1200 ml  Net -1200 ml    Filed Weights   10/11/20 1937  Weight: 100 kg    Examination:  General exam: Appears calm and comfortable Respiratory system: Clear to auscultation. Respiratory effort normal. Cardiovascular system: S1 & S2 heard, RRR. No murmurs, rubs, gallops or clicks. Gastrointestinal system: Abdomen is nondistended, soft and nontender. No organomegaly or masses felt. Normal bowel sounds heard. Central nervous system: Alert and oriented. No focal neurological deficits. Musculoskeletal: No edema. No calf tenderness Skin: No cyanosis. No rashes Psychiatry: Judgement and insight  appear normal. Mood & affect appropriate.     Data Reviewed: I have personally reviewed following labs and imaging studies  CBC Lab Results  Component Value Date   WBC 8.2 10/15/2020   RBC 3.98 10/15/2020   HGB 9.2 (L) 10/15/2020   HCT 29.7 (L) 10/15/2020   MCV 74.6 (L) 10/15/2020   MCH 23.1 (L) 10/15/2020    PLT 233 10/15/2020   MCHC 31.0 10/15/2020   RDW 17.8 (H) 10/15/2020   LYMPHSABS 2.6 10/15/2020   MONOABS 0.9 10/15/2020   EOSABS 0.3 10/15/2020   BASOSABS 0.1 02/40/9735     Last metabolic panel Lab Results  Component Value Date   NA 142 10/16/2020   K 3.4 (L) 10/16/2020   CL 113 (H) 10/16/2020   CO2 23 10/16/2020   BUN <5 (L) 10/16/2020   CREATININE 0.56 10/16/2020   GLUCOSE 105 (H) 10/16/2020   GFRNONAA >60 10/16/2020   GFRAA 99 11/28/2019   CALCIUM 8.1 (L) 10/16/2020   PHOS 3.2 10/15/2020   PROT 5.2 (L) 10/15/2020   ALBUMIN 2.4 (L) 10/15/2020   LABGLOB 3.3 11/28/2019   LABGLOB 2.7 11/28/2019   AGRATIO 1.4 11/28/2019   BILITOT 0.1 (L) 10/15/2020   ALKPHOS 52 10/15/2020   AST 24 10/15/2020   ALT 22 10/15/2020   ANIONGAP 6 10/16/2020    CBG (last 3)  Recent Labs    10/16/20 1614 10/16/20 2025 10/17/20 0558  GLUCAP 88 107* 115*      GFR: Estimated Creatinine Clearance: 77.4 mL/min (by C-G formula based on SCr of 0.56 mg/dL).  Coagulation Profile: No results for input(s): INR, PROTIME in the last 168 hours.  Recent Results (from the past 240 hour(s))  Urine culture     Status: Abnormal   Collection Time: 10/11/20  9:10 PM   Specimen: Urine, Random  Result Value Ref Range Status   Specimen Description URINE, RANDOM  Final   Special Requests NONE  Final   Culture (A)  Final    <10,000 COLONIES/mL INSIGNIFICANT GROWTH Performed at [facility], [address] [place], [place] [address]    Report Status 10/13/2020 FINAL  Final  SARS CORONAVIRUS 2 (TAT 6-24 HRS) Nasopharyngeal Nasopharyngeal Swab     Status: None   Collection Time: 10/12/20  2:30 AM   Specimen: Nasopharyngeal Swab  Result Value Ref Range Status   SARS Coronavirus 2 NEGATIVE NEGATIVE Final    Comment: (NOTE) SARS-CoV-2 target nucleic acids are NOT DETECTED.  The SARS-CoV-2 RNA is generally detectable in upper and lower respiratory specimens during the acute phase of  infection. Negative results do not preclude SARS-CoV-2 infection, do not rule out co-infections with other pathogens, and should not be used as the sole basis for treatment or other patient management decisions. Negative results must be combined with clinical observations, patient history, and epidemiological information. The expected result is Negative.  Fact Sheet for Patients: SugarRoll.be  Fact Sheet for Healthcare Providers: https://www.woods-mathews.com/  This test is not yet approved or cleared by the [place] FDA and  has been authorized for detection and/or diagnosis of SARS-CoV-2 by FDA under an Emergency Use Authorization (EUA). This EUA will remain  in effect (meaning this test can be used) for the duration of the COVID-19 declaration under Se ction 564(b)(1) of the Act, 21 U.S.C. section 360bbb-3(b)(1), unless the authorization is terminated or revoked sooner.  Performed at [facility], [place] [address], [place], [place] [address]  Radiology Studies: No results found.      Scheduled Meds:  enoxaparin (LOVENOX) injection  40 mg Subcutaneous Q24H   fentaNYL  1 patch Transdermal Q72H   insulin aspart  0-15 Units Subcutaneous TID WC   insulin aspart  0-5 Units Subcutaneous QHS   pregabalin  200 mg Oral TID   Continuous Infusions:  dextrose 5 % and 0.9% NaCl 125 mL/hr at 10/15/20 1007     LOS: 3 days     Cordelia Poche, MD [facility] 10/17/2020, 10:44 AM  If 7PM-7AM, please contact night-coverage [email or web address]

## 2020-10-17 NOTE — Consult Note (Signed)
   [facility] Inpatient Consult   10/17/2020  Amanda Morris [birth year] [id number]  [place] [facility] [ACO] Patient: [name]  Patient screened for less than 7 days re-hospitalization with noted high risk score for unplanned readmission risk and  to assess for potential [place] Management service needs for post hospital transition.  Review of patient's medical record reveals patient is being recommended for a skilled nursing facility level of care for transition at this time. Reviewed inpatient [facility] team notes for transitional care needs. Noted for insurance authorization as well.  Plan:  Continue to follow progress and disposition to assess for post hospital care management needs. If patient transitions to a skilled facility her post hospital needs are to be met for transition at that level of care. If patient transitions to community/home can be offered.  For questions contact:   Natividad Brood, RN BSN [place] [facility] Liaison  [phone] business mobile phone Toll free office [phone]  Fax number: [phone] [email or web address]

## 2020-10-17 NOTE — Care Management Important Message (Signed)
Important Message  Patient Details  Name: Amanda Morris MRN: [id number] Date of Birth: 21-Sep-1950   Medicare Important Message Given:  Yes     Deshea Pooley [address] 10/17/2020, 1:14 PM

## 2020-10-17 NOTE — Progress Notes (Signed)
Physical Therapy Treatment Patient Details Name: Amanda Morris MRN: [id number] DOB: [birth year] Today's Date: 10/17/2020    History of Present Illness Pt is a 70 y/o female presenting on 6/18 to the ER with back pain after being found down. Of note pt with recent hx of lumbar surgery 6/16 with L5 and S1 decompression and fusion. CXR and CT lumbar showed no acute processes. PMH: insulin-dependent type 2 diabetes, dyslipidemia, hypertension, chronic back pain.    PT Comments    The pt continues to make good progress with mobility and endurance at this time. She was able to complete a series of LE strengthening exercises using body weight and light manual resistance prior to good bout of hallway ambulation with use of RW. The pt had x2 instances of L knee buckling with gait (pt reports due to sharp pain rather than weakness). Will continue to benefit from skilled PT to maximize functional strength, power, and improve dynamic stability and endurance to allow for safe return home.     Follow Up Recommendations  SNF     Equipment Recommendations  Other (comment) (defer to post acute)    Recommendations for Other Services       Precautions / Restrictions Precautions Precautions: Back Precaution Booklet Issued: No Precaution Comments: Pt able to recall 3/3 spinal precautions this session. Required Braces or Orthoses:  (no brace) Restrictions Weight Bearing Restrictions: No Other Position/Activity Restrictions: do not lift more than 10 lbs    Mobility  Bed Mobility Overal bed mobility: Modified Independent             General bed mobility comments: pt sitting EOB upon arrival of PT, attempting to get herself out of bed.    Transfers Overall transfer level: Needs assistance Equipment used: Rolling walker (2 wheeled) Transfers: Sit to/from Stand Sit to Stand: Supervision         General transfer comment: slow to power up, but no LOB this morning. cues for hand  positioning. able to progress to completing 2 sets of 5 from recliner without UE support  Ambulation/Gait Ambulation/Gait assistance: Min guard Gait Distance (Feet): 15 Feet (+ 60 ft + 60 ft) Assistive device: Rolling walker (2 wheeled) Gait Pattern/deviations: Trunk flexed;Step-through pattern;Decreased stride length;Shuffle Gait velocity: 0.36 m/s Gait velocity interpretation: <1.31 ft/sec, indicative of household ambulator General Gait Details: pt with progressively more antalgic gait with fatigue. reports onset L hip pain with x2 instances of L knee buckling. hallenged by increased waling speed x3 wiht HR to 133 bpm. cues for posture       Balance Overall balance assessment: Needs assistance Sitting-balance support: Feet supported;No upper extremity supported Sitting balance-Leahy Scale: Good     Standing balance support: Bilateral upper extremity supported Standing balance-Leahy Scale: Poor Standing balance comment: static stance without UE support, BUE support for gait                            Cognition Arousal/Alertness: Awake/alert Behavior During Therapy: WFL for tasks assessed/performed;Flat affect Overall Cognitive Status: Within Functional Limits for tasks assessed                                 General Comments: pt following all cues, needing some increased cues for self-monitoring      Exercises General Exercises - Lower Extremity Long Arc Quad: Strengthening;Both;10 reps;Seated (against min manual resistance) Heel Slides: Strengthening;Both;10 reps;Seated (against  min manual resistance) Hip Flexion/Marching: AROM;Both;10 reps;Seated Toe Raises: AROM;Both;10 reps;Seated Heel Raises: AROM;Both;10 reps;Seated Other Exercises Other Exercises: 5x sit-stand without use of UE, max cues for LE activation and slowed eccentric control. 2 x 5 Other Exercises: speed intervals with walking, 3 bouts of 10 ft "fast"    General Comments General  comments (skin integrity, edema, etc.): VSS, min cues for safety, back precautions with movement      Pertinent Vitals/Pain Pain Assessment: Faces Faces Pain Scale: Hurts little more Pain Location: L hip after activity Pain Descriptors / Indicators: Aching;Dull;Discomfort (deep) Pain Intervention(s): Limited activity within patient's tolerance;Monitored during session;Repositioned     PT Goals (current goals can now be found in the care plan section) Acute Rehab PT Goals Patient Stated Goal: go to SNF to get stronger and then go home PT Goal Formulation: With patient Time For Goal Achievement: 10/26/20 Potential to Achieve Goals: Good Progress towards PT goals: Progressing toward goals    Frequency    Min 3X/week      PT Plan Current plan remains appropriate       AM-PAC PT "6 Clicks" Mobility   Outcome Measure  Help needed turning from your back to your side while in a flat bed without using bedrails?: None Help needed moving from lying on your back to sitting on the side of a flat bed without using bedrails?: A Little Help needed moving to and from a bed to a chair (including a wheelchair)?: A Little Help needed standing up from a chair using your arms (e.g., wheelchair or bedside chair)?: A Little Help needed to walk in hospital room?: A Little Help needed climbing 3-5 steps with a railing? : A Little 6 Click Score: 19    End of Session Equipment Utilized During Treatment: Gait belt Activity Tolerance: Patient tolerated treatment well Patient left: in chair;with call bell/phone within reach;with chair alarm set Nurse Communication: Mobility status PT Visit Diagnosis: Other abnormalities of gait and mobility (R26.89);Pain;Unsteadiness on feet (R26.81);Muscle weakness (generalized) (M62.81);History of falling (Z91.81) Pain - Right/Left: Left Pain - part of body: Hip     Time: 1916-6060 PT Time Calculation (min) (ACUTE ONLY): 32 min  Charges:  $Gait Training:  8-22 mins $Therapeutic Exercise: 8-22 mins                     Karma Ganja, PT, DPT   Acute Rehabilitation Department Pager #: [phone]   Otho Bellows 10/17/2020, 9:20 AM

## 2020-10-18 ENCOUNTER — Inpatient Hospital Stay (HOSPITAL_COMMUNITY): Payer: Medicare Other

## 2020-10-18 DIAGNOSIS — M4316 Spondylolisthesis, lumbar region: Secondary | ICD-10-CM | POA: Diagnosis not present

## 2020-10-18 DIAGNOSIS — M5440 Lumbago with sciatica, unspecified side: Secondary | ICD-10-CM | POA: Diagnosis not present

## 2020-10-18 DIAGNOSIS — R531 Weakness: Secondary | ICD-10-CM | POA: Diagnosis not present

## 2020-10-18 DIAGNOSIS — E1142 Type 2 diabetes mellitus with diabetic polyneuropathy: Secondary | ICD-10-CM | POA: Diagnosis not present

## 2020-10-18 DIAGNOSIS — K219 Gastro-esophageal reflux disease without esophagitis: Secondary | ICD-10-CM | POA: Diagnosis not present

## 2020-10-18 DIAGNOSIS — R918 Other nonspecific abnormal finding of lung field: Secondary | ICD-10-CM | POA: Diagnosis not present

## 2020-10-18 DIAGNOSIS — G629 Polyneuropathy, unspecified: Secondary | ICD-10-CM | POA: Diagnosis not present

## 2020-10-18 DIAGNOSIS — M79605 Pain in left leg: Secondary | ICD-10-CM | POA: Diagnosis not present

## 2020-10-18 DIAGNOSIS — R059 Cough, unspecified: Secondary | ICD-10-CM | POA: Diagnosis not present

## 2020-10-18 DIAGNOSIS — E119 Type 2 diabetes mellitus without complications: Secondary | ICD-10-CM | POA: Diagnosis not present

## 2020-10-18 DIAGNOSIS — G8929 Other chronic pain: Secondary | ICD-10-CM | POA: Diagnosis not present

## 2020-10-18 DIAGNOSIS — Z9889 Other specified postprocedural states: Secondary | ICD-10-CM | POA: Diagnosis not present

## 2020-10-18 DIAGNOSIS — Z743 Need for continuous supervision: Secondary | ICD-10-CM | POA: Diagnosis not present

## 2020-10-18 DIAGNOSIS — M549 Dorsalgia, unspecified: Secondary | ICD-10-CM | POA: Diagnosis not present

## 2020-10-18 DIAGNOSIS — I10 Essential (primary) hypertension: Secondary | ICD-10-CM | POA: Diagnosis not present

## 2020-10-18 DIAGNOSIS — Z9181 History of falling: Secondary | ICD-10-CM | POA: Diagnosis not present

## 2020-10-18 DIAGNOSIS — Z9682 Presence of neurostimulator: Secondary | ICD-10-CM | POA: Diagnosis not present

## 2020-10-18 DIAGNOSIS — K589 Irritable bowel syndrome without diarrhea: Secondary | ICD-10-CM | POA: Diagnosis not present

## 2020-10-18 DIAGNOSIS — R5381 Other malaise: Secondary | ICD-10-CM | POA: Diagnosis not present

## 2020-10-18 DIAGNOSIS — K5900 Constipation, unspecified: Secondary | ICD-10-CM | POA: Diagnosis not present

## 2020-10-18 DIAGNOSIS — Z7401 Bed confinement status: Secondary | ICD-10-CM | POA: Diagnosis not present

## 2020-10-18 DIAGNOSIS — E785 Hyperlipidemia, unspecified: Secondary | ICD-10-CM | POA: Diagnosis not present

## 2020-10-18 DIAGNOSIS — Z794 Long term (current) use of insulin: Secondary | ICD-10-CM | POA: Diagnosis not present

## 2020-10-18 DIAGNOSIS — Z7984 Long term (current) use of oral hypoglycemic drugs: Secondary | ICD-10-CM | POA: Diagnosis not present

## 2020-10-18 DIAGNOSIS — M79604 Pain in right leg: Secondary | ICD-10-CM | POA: Diagnosis not present

## 2020-10-18 DIAGNOSIS — E1169 Type 2 diabetes mellitus with other specified complication: Secondary | ICD-10-CM | POA: Diagnosis not present

## 2020-10-18 DIAGNOSIS — R1013 Epigastric pain: Secondary | ICD-10-CM | POA: Diagnosis not present

## 2020-10-18 DIAGNOSIS — W19XXXD Unspecified fall, subsequent encounter: Secondary | ICD-10-CM | POA: Diagnosis not present

## 2020-10-18 DIAGNOSIS — Z981 Arthrodesis status: Secondary | ICD-10-CM | POA: Diagnosis not present

## 2020-10-18 DIAGNOSIS — M6282 Rhabdomyolysis: Secondary | ICD-10-CM | POA: Diagnosis not present

## 2020-10-18 DIAGNOSIS — E876 Hypokalemia: Secondary | ICD-10-CM | POA: Diagnosis not present

## 2020-10-18 DIAGNOSIS — M4800 Spinal stenosis, site unspecified: Secondary | ICD-10-CM | POA: Diagnosis not present

## 2020-10-18 DIAGNOSIS — E1165 Type 2 diabetes mellitus with hyperglycemia: Secondary | ICD-10-CM | POA: Diagnosis not present

## 2020-10-18 LAB — GLUCOSE, CAPILLARY
Glucose-Capillary: 149 mg/dL — ABNORMAL HIGH (ref 70–99)
Glucose-Capillary: 189 mg/dL — ABNORMAL HIGH (ref 70–99)

## 2020-10-18 MED ORDER — AZITHROMYCIN 500 MG PO TABS: 500.0000 mg | ORAL_TABLET | Freq: Every day | ORAL | 0 refills | Status: AC

## 2020-10-18 MED ORDER — LANTUS SOLOSTAR 100 UNIT/ML ~~LOC~~ SOPN: 10.0000 [IU] | PEN_INJECTOR | Freq: Every day | SUBCUTANEOUS | Status: DC

## 2020-10-18 MED ORDER — FENTANYL 50 MCG/HR TD PT72: 1.0000 | MEDICATED_PATCH | TRANSDERMAL | 0 refills | Status: DC

## 2020-10-18 MED ORDER — HYDROCODONE-ACETAMINOPHEN 5-325 MG PO TABS: 1.0000 | ORAL_TABLET | ORAL | 0 refills | Status: DC | PRN

## 2020-10-18 NOTE — Progress Notes (Signed)
Patient sitting up in chair and on the phone.  Has no complaints at this time.  Moves all extremities well, awake alert oriented x3.  Plan for rehab today.  She is unsure when she is leaving.  Follow-up as an outpatient with Dr. Elizebeth Brooking Loyalty Brashier, DO  Neurosurgeon

## 2020-10-18 NOTE — Progress Notes (Signed)
Physical Therapy Treatment Patient Details Name: Amanda Morris MRN: [id number] DOB: [birth year] Today's Date: 10/18/2020    History of Present Illness Pt is a 70 y/o female presenting on 6/18 to the ER with back pain after being found down. Of note pt with recent hx of lumbar surgery 6/16 with L5 and S1 decompression and fusion. CXR and CT lumbar showed no acute processes. PMH: insulin-dependent type 2 diabetes, dyslipidemia, hypertension, chronic back pain.    PT Comments    Pt standing in room on arrival to go back to bed with tech.  Encouraged ambulation before back to bed and she was agreeable.  Post gt training performed LE strengthening at edge of bed.  Patient able to recall 3/3 spinal precautions.  Will continue to recommend rehab in a post acute setting to maximize functional gains before returning home.    Follow Up Recommendations  SNF     Equipment Recommendations  Other (comment) (defer to post acute rehab.)    Recommendations for Other Services       Precautions / Restrictions Precautions Precautions: Back Precaution Booklet Issued: No Precaution Comments: Pt able to recall 3/3 spinal precautions this session. Restrictions Weight Bearing Restrictions: No Other Position/Activity Restrictions: do not lift more than 10 lbs    Mobility  Bed Mobility Overal bed mobility: Modified Independent Bed Mobility: Rolling;Sidelying to Sit Rolling: Supervision Sidelying to sit: Supervision     Sit to sidelying: Supervision General bed mobility comments: Required decreased assistance to move to edge of bed this session.  Pt required cues to maintain log rolling.    Transfers Overall transfer level: Needs assistance Equipment used: Rolling walker (2 wheeled) Transfers: Sit to/from Stand Sit to Stand: Supervision Stand pivot transfers: Supervision       General transfer comment: slow to power up, but no LOB this morning. cues for hand  positioning.  Ambulation/Gait Ambulation/Gait assistance: Min guard Gait Distance (Feet): 120 Feet Assistive device: Rolling walker (2 wheeled) Gait Pattern/deviations: Trunk flexed;Step-through pattern;Decreased stride length;Shuffle     General Gait Details: Pt required cues for posture and forward gaze.   Stairs             Wheelchair Mobility    Modified Rankin (Stroke Patients Only)       Balance Overall balance assessment: Needs assistance Sitting-balance support: Feet supported;No upper extremity supported Sitting balance-Leahy Scale: Good Sitting balance - Comments: reliant on UE support to maintain balance     Standing balance-Leahy Scale: Poor Standing balance comment: static stance without UE support, BUE support for gait                            Cognition Arousal/Alertness: Awake/alert Behavior During Therapy: WFL for tasks assessed/performed;Flat affect Overall Cognitive Status: Within Functional Limits for tasks assessed                                        Exercises General Exercises - Lower Extremity Long Arc Quad: Strengthening;Both;10 reps;Seated Hip Flexion/Marching: AROM;Both;10 reps;Seated Toe Raises: AROM;Both;10 reps;Seated Heel Raises: AROM;Both;10 reps;Seated    General Comments        Pertinent Vitals/Pain Pain Assessment: No/denies pain Faces Pain Scale: Hurts little more Pain Location: L hip after activity Pain Descriptors / Indicators: Aching;Dull;Discomfort (deep) Pain Intervention(s): Monitored during session;Repositioned    Home Living  Prior Function            PT Goals (current goals can now be found in the care plan section) Acute Rehab PT Goals Patient Stated Goal: go to SNF to get stronger and then go home Potential to Achieve Goals: Good Progress towards PT goals: Progressing toward goals    Frequency    Min 3X/week      PT Plan Current  plan remains appropriate    Co-evaluation              AM-PAC PT "6 Clicks" Mobility   Outcome Measure  Help needed turning from your back to your side while in a flat bed without using bedrails?: A Little Help needed moving from lying on your back to sitting on the side of a flat bed without using bedrails?: A Little Help needed moving to and from a bed to a chair (including a wheelchair)?: A Little Help needed standing up from a chair using your arms (e.g., wheelchair or bedside chair)?: A Little Help needed to walk in hospital room?: A Little Help needed climbing 3-5 steps with a railing? : A Little 6 Click Score: 18    End of Session Equipment Utilized During Treatment: Gait belt Activity Tolerance: Patient tolerated treatment well Patient left: in bed;with bed alarm set;with call bell/phone within reach Nurse Communication: Mobility status PT Visit Diagnosis: Other abnormalities of gait and mobility (R26.89);Pain;Unsteadiness on feet (R26.81);Muscle weakness (generalized) (M62.81);History of falling (Z91.81) Pain - Right/Left: Left Pain - part of body: Hip     Time: 3383-2919 PT Time Calculation (min) (ACUTE ONLY): 8 min  Charges:  $Gait Training: 8-22 mins                     Erasmo Leventhal , PTA Acute Rehabilitation Services Pager [phone] Office [phone]    Guelda Batson Eli Hose 10/18/2020, 2:13 PM

## 2020-10-18 NOTE — TOC Transition Note (Signed)
Transition of Care [facility]) - CM/SW Discharge Note   Patient Details  Name: Amanda Morris MRN: [id number] Date of Birth: 04-11-1951  Transition of Care [facility]) CM/SW Contact:  Gabrielle Dare Phone Number: 10/18/2020, 10:18 AM   Clinical Narrative:    Patient will Discharge To: [facility] Anticipated DC Date:10/18/20 Family Notified:yes, niece Darl Pikes, [phone] Transport TR:RNHA   Per MD patient ready for DC to [facility]. RN, patient, patient's family, and facility notified of DC. Assessment, Fl2/Pasrr, and Discharge Summary sent to facility. RN given number for report [phone] or [phone]). DC packet on chart. Ambulance transport requested for patient for 12:00pm  CSW signing off.  Reed Breech [facility] [phone]     Final next level of care: Skilled Nursing Facility Barriers to Discharge: No Barriers Identified   Patient Goals and CMS Choice   CMS Medicare.gov Compare Post Acute Care list provided to:: Other (Comment Required) (Given to pt's nieces per pt) Choice offered to / list presented to : Patient  Discharge Placement              Patient chooses bed at: [facility] Patient to be transferred to facility by: [place] Name of family member notified: Darl Pikes Patient and family notified of of transfer: 10/18/20  Discharge Plan and Services In-house Referral: Clinical Social Work                                   Social Determinants of Health ([place]add) Interventions     Readmission Risk Interventions No flowsheet data found.

## 2020-10-18 NOTE — Progress Notes (Signed)
Occupational Therapy Treatment Patient Details Name: Amanda Morris MRN: [id number] DOB: 23-Jun-1950 Today's Date: 10/18/2020    History of present illness Pt is a 70 y/o female presenting on 6/18 to the ER with back pain after being found down. Of note pt with recent hx of lumbar surgery 6/16 with L5 and S1 decompression and fusion. CXR and CT lumbar showed no acute processes. PMH: insulin-dependent type 2 diabetes, dyslipidemia, hypertension, chronic back pain.   OT comments  Pt. Seen for skilled OT treatment session.  Agreeable to oob and states she wants "a wash up".  Bed mobility with no physical assistance required.  Min guard/S for ambulation.  S for LB dressing sit/stand with cues for maintaining back precautions during washing ie: "no twisting".   Continue with acute OT stated goals.  Pt. Motivated and eager for participation.    Follow Up Recommendations  SNF;Supervision/Assistance - 24 hour    Equipment Recommendations  None recommended by OT    Recommendations for Other Services      Precautions / Restrictions Precautions Precautions: Back Precaution Comments: Pt able to recall 3/3 spinal precautions this session. Restrictions Weight Bearing Restrictions: No Other Position/Activity Restrictions: do not lift more than 10 lbs       Mobility Bed Mobility Overal bed mobility: Modified Independent Bed Mobility: Rolling;Sidelying to Sit Rolling: Supervision Sidelying to sit: Min guard            Transfers Overall transfer level: Needs assistance Equipment used: Rolling walker (2 wheeled) Transfers: Sit to/from [facility] Sit to Stand: Supervision Stand pivot transfers: Min guard            Balance                                           ADL either performed or assessed with clinical judgement   ADL Overall ADL's : Needs assistance/impaired         Upper Body Bathing: Set up;Sitting   Lower Body Bathing:  Supervison/ safety;Sit to/from stand;Cueing for back precautions Lower Body Bathing Details (indicate cue type and reason): cues for no twisting while washing buttocks Upper Body Dressing : Set up;Sitting   Lower Body Dressing: Set up;Sitting/lateral leans Lower Body Dressing Details (indicate cue type and reason): socks Toilet Transfer: Min guard;RW;Ambulation Toilet Transfer Details (indicate cue type and reason): simulated/observed with in room ambulation from eob to recliner. declined need for use of b.room Toileting- Water quality scientist and Hygiene: Min guard;Cueing for compensatory techniques;Cueing for back precautions;Sit to/from stand Toileting - Clothing Manipulation Details (indicate cue type and reason): observed while washing buttocks during LB bathing     Functional mobility during ADLs: Min guard;Rolling walker General ADL Comments: intermittent cues for maintaining back precautions during bathing     Vision       Perception     Praxis      Cognition Arousal/Alertness: Awake/alert Behavior During Therapy: WFL for tasks assessed/performed;Flat affect Overall Cognitive Status: Within Functional Limits for tasks assessed                                          Exercises     Shoulder Instructions       General Comments      Pertinent Vitals/ Pain  Pain Assessment: No/denies pain  Home Living                                          Prior Functioning/Environment              Frequency  Min 2X/week        Progress Toward Goals  OT Goals(current goals can now be found in the care plan section)  Progress towards OT goals: Progressing toward goals     Plan Discharge plan remains appropriate    Co-evaluation                 AM-PAC OT "6 Clicks" Daily Activity     Outcome Measure   Help from another person eating meals?: None Help from another person taking care of personal grooming?: A  Little Help from another person toileting, which includes using toliet, bedpan, or urinal?: A Little Help from another person bathing (including washing, rinsing, drying)?: A Lot Help from another person to put on and taking off regular upper body clothing?: A Little Help from another person to put on and taking off regular lower body clothing?: A Lot 6 Click Score: 17    End of Session Equipment Utilized During Treatment: Rolling walker  OT Visit Diagnosis: Unsteadiness on feet (R26.81);Muscle weakness (generalized) (M62.81);History of falling (Z91.81);Pain   Activity Tolerance Patient tolerated treatment well   Patient Left in chair;with call bell/phone within reach;with chair alarm set   Nurse Communication Other (comment) (pt. requesting RN come and check her IV site)        Time: 9678-9381 OT Time Calculation (min): 20 min  Charges: OT General Charges $OT Visit: 1 Visit OT Treatments $Self Care/Home Management : 8-22 mins  Sonia Baller, COTA/L Acute Rehabilitation [phone]    Tanya Nones 10/18/2020, 11:25 AM

## 2020-10-18 NOTE — Discharge Summary (Signed)
Physician Discharge Summary  Amanda Morris YEB:[id number] DOB: 1950-11-09 DOA: 10/11/2020  PCP: Hali Marry, MD  Admit date: 10/11/2020 Discharge date: 10/18/2020  Admitted From: Home Disposition: SNF  Recommendations for Outpatient Follow-up:  Follow up with PCP in 1 week Please obtain BMP/CBC in one week Follow up with neurosurgery, Dr. Christella Noa Please follow up on the following pending results: None   Discharge Condition: Stable CODE STATUS: Full code Diet recommendation: Regular   Brief/Interim Summary:  Admission HPI written by Atha Starks, DO   HPI: Amanda Morris is a 70 y.o. female, with PMH of insulin-dependent type 2 diabetes, dyslipidemia, hypertension, chronic back pain who presented to the ER on 10/11/2020 with back pain and being found on the ground for some period of time with recent history of lumbar surgery on 10/09/2020.   Patient was hospitalized for elective surgery on her back on 10/09/2020.  She had L5 and S1 decompressed and rods placed.  Apparently she tolerated the procedure well and she was discharged home the following day, 10/10/2020.  She then said she went home and while in the living room she dropped to the ground due to lower extremity weakness.  She did not hit her head and did not lose consciousness.  She says she has a walker at home but was not using.  She laid on the ground and could not get up.  She does not live with anyone at home.  Then sometime later, she had family check on her they found her on the ground.  They were able to get her up and she was able to ambulate some, but due to some worsening back pain, she came to the ED for evaluation.  She has not eaten or drinking or taking her medications due to being on the ground for extended period time.    Hospital course:  Back pain In setting of recent fall and history of recent L5-S1 decompression and lumbar fusion.  Neurosurgery was consulted on admission.  No  need for surgical intervention.  Pain management was achieved with fentanyl patch secondary to allergy/intolerance history with hydrocodone and oxycodone.  She recently received Norco with no issues noted.  Continue fentanyl patch as an outpatient. Recommend weaning.  Fall In setting of recent lumbar surgery.  Patient was also not using her walker as an outpatient.  PT/OT recommending SNF on discharge.  Rhabdomyolysis CK on admission of 1700.  No associated AKI.  Started on IV fluids with improvement of CK.  Resolved.   Hypokalemia Improved with supplementation. Repeat BMP.   Diabetes mellitus, type II Hemoglobin A1c of 6.8%.  Patient is on glipizide, Jardiance as an outpatient. Decrease to Lantus 10 units daily, Continue Jardiance and discontinue glipizide.  Hyperlipidemia Patient is on pravastatin as an outpatient.  This was held secondary to rhabdomyolysis and elevated CK. Resume pravastatin.  Abnormal x-ray Possibly atypical pneumonia. Mildly symptomatic. Discharge with azithromycin x5 days.   Obesity Body mass index is 37.84 kg/m.  Discharge Diagnoses:  Active Problems:   Chronic low back pain   Hyperlipidemia associated with type 2 diabetes mellitus ([place])   Type 2 diabetes mellitus with other specified complication (HCC)   Obesity (BMI 30-39.9)   Essential hypertension   Spinal stenosis   Fall   Elevated CK   Rhabdomyolysis    Discharge Instructions   Allergies as of 10/18/2020       Reactions   Penicillins Hives, Other (See Comments)   PATIENT HAS HAD A  PCN REACTION WITH IMMEDIATE RASH, FACIAL/TONGUE/THROAT SWELLING, SOB, OR LIGHTHEADEDNESS WITH HYPOTENSION:  #  #  YES  #  #  Has patient had a PCN reaction causing severe rash involving mucus membranes or skin necrosis: No Has patient had a PCN reaction that required hospitalization: No Has patient had a PCN reaction occurring within the last 10 years: No If all of the above answers are "NO", then may proceed  with Cephalosporin use.   Duloxetine Other (See Comments)   GI upset   Aspirin Nausea Only   Codeine Itching   Hydrocodone Nausea Only, Other (See Comments)   Nose bleed   Metformin And Related Diarrhea, Nausea Only   Oxycodone Nausea And Vomiting   GI Upset (intolerance)   Victoza [liraglutide] Other (See Comments)   Abdominal pain        Medication List     STOP taking these medications    glipiZIDE 10 MG tablet Commonly known as: GLUCOTROL   traMADol 50 MG tablet Commonly known as: ULTRAM       TAKE these medications    Accu-Chek Aviva Plus test strip Generic drug: glucose blood FOR TESTING BLOOD SUGARS 3 TIMES DAILY. DX:E11.9   Accu-Chek Aviva Plus w/Device Kit Check blood sugars 3 times daily DX:E11.9   Accu-Chek Softclix Lancets lancets Dx:E11.9   aspirin EC 81 MG tablet Take 1 tablet (81 mg total) by mouth daily.   azithromycin 500 MG tablet Commonly known as: Zithromax Take 1 tablet (500 mg total) by mouth daily for 5 days.   B-D ULTRAFINE III SHORT PEN 31G X 8 MM Misc Generic drug: Insulin Pen Needle USE AS DIRECTED   cholecalciferol 1000 units tablet Commonly known as: VITAMIN D Take 1,000 Units by mouth daily.   dexlansoprazole 60 MG capsule Commonly known as: DEXILANT Take 60 mg by mouth daily.   dicyclomine 20 MG tablet Commonly known as: BENTYL Take 20 mg by mouth every 6 (six) hours as needed for spasms (cramps/stomach pain).   doxepin 10 MG capsule Commonly known as: SINEQUAN Take 10 mg by mouth at bedtime.   empagliflozin 25 MG Tabs tablet Commonly known as: Jardiance TAKE 25 MG BY MOUTH DAILY. What changed:  how much to take how to take this when to take this additional instructions   fentaNYL 50 MCG/HR Commonly known as: [place] 1 patch onto the skin every 3 (three) days. Start taking on: October 20, 2020   HYDROcodone-acetaminophen 5-325 MG tablet Commonly known as: NORCO/VICODIN Take 1-2 tablets by mouth  every 4 (four) hours as needed for moderate pain.   Krill Oil 500 MG Caps Take 500 mg by mouth daily.   Lantus SoloStar 100 UNIT/ML Solostar Pen Generic drug: insulin glargine Inject 10 Units into the skin daily. What changed:  how much to take when to take this reasons to take this   levocetirizine 5 MG tablet Commonly known as: XYZAL Take 1 tablet (5 mg total) by mouth every evening.   multivitamin with minerals Tabs tablet Take 1 tablet by mouth daily.   ondansetron 4 MG disintegrating tablet Commonly known as: Zofran ODT Take 1 tablet (4 mg total) by mouth every 8 (eight) hours as needed for nausea or vomiting.   OVER THE COUNTER MEDICATION Take 1 tablet by mouth 3 (three) times daily. Golo otc weight loss supplement   perphenazine 8 MG tablet Commonly known as: TRILAFON Take 8 mg by mouth at bedtime.   pravastatin 40 MG tablet Commonly known as: PRAVACHOL  Take 1 tablet (40 mg total) by mouth at bedtime.   pregabalin 200 MG capsule Commonly known as: LYRICA TAKE 1 CAPSULE (200 MG TOTAL) BY MOUTH 3 (THREE) TIMES DAILY.   tiZANidine 4 MG tablet Commonly known as: ZANAFLEX Take 1 tablet (4 mg total) by mouth every 6 (six) hours as needed for muscle spasms.        Follow-up Information     Ashok Pall, MD Follow up.   Specialty: Neurosurgery Why: please call to make an appointment in 3 weeks Contact information: [address] [place] [address] [phone]                Allergies  Allergen Reactions   Penicillins Hives and Other (See Comments)    PATIENT HAS HAD A PCN REACTION WITH IMMEDIATE RASH, FACIAL/TONGUE/THROAT SWELLING, SOB, OR LIGHTHEADEDNESS WITH HYPOTENSION:  #  #  YES  #  #  Has patient had a PCN reaction causing severe rash involving mucus membranes or skin necrosis: No Has patient had a PCN reaction that required hospitalization: No Has patient had a PCN reaction occurring within the last 10 years: No If all  of the above answers are "NO", then may proceed with Cephalosporin use.    Duloxetine Other (See Comments)    GI upset   Aspirin Nausea Only   Codeine Itching   Hydrocodone Nausea Only and Other (See Comments)    Nose bleed   Metformin And Related Diarrhea and Nausea Only   Oxycodone Nausea And Vomiting    GI Upset (intolerance)   Victoza [Liraglutide] Other (See Comments)    Abdominal pain    Consultations: Neurosurgery   Procedures/Studies: DG Chest 2 View  Result Date: 10/11/2020 CLINICAL DATA:  Tachycardia, back pain EXAM: CHEST - 2 VIEW COMPARISON:  07/24/2018, 10/09/2020, 08/29/2020 FINDINGS: Frontal and lateral views of the chest were obtained. Evaluation is limited by kyphosis and difficulty positioning the patient. The cardiac silhouette is grossly unremarkable. Low lung volumes without airspace disease, effusion, or pneumothorax. Kyphosis centered at the thoracolumbar junction with prominent spondylosis unchanged. Stable spinal stimulator within the thoracic central canal. IMPRESSION: 1. No acute intrathoracic process. Electronically Signed   By: Randa Ngo M.D.   On: 10/11/2020 20:47   DG Lumbar Spine 2-3 Views  Result Date: 10/09/2020 CLINICAL DATA:  Intraoperative fluoroscopy EXAM: LUMBAR SPINE - 2-3 VIEW; DG C-ARM 1-60 MIN COMPARISON:  08/04/2020 FINDINGS: Intraoperative fluoroscopy is obtained for surgical control purposes. Fluoroscopy time is recorded at 31.7 seconds. Three spot fluoroscopic images are obtained. Spot fluoroscopic images demonstrate previous posterior rod and screw fixation from L3 through L5 with intervertebral disc prostheses at L3-4 and L4-5. a localization marker is placed over the S1 vertebra. Subsequent images demonstrate additional posterior fixation to the sacrum. A posterior generator pack is also demonstrated. IMPRESSION: Intraoperative fluoroscopy obtained for surgical control purposes. Electronically Signed   By: Lucienne Capers M.D.   On:  10/09/2020 19:50   CT Lumbar Spine Wo Contrast  Result Date: 10/11/2020 CLINICAL DATA:  Recent lumbar spine surgery. Fall with low back pain. EXAM: CT LUMBAR SPINE WITHOUT CONTRAST TECHNIQUE: Multidetector CT imaging of the lumbar spine was performed without intravenous contrast administration. Multiplanar CT image reconstructions were also generated. COMPARISON:  None. FINDINGS: Segmentation: Standard Alignment: Grade 1 anterolisthesis at L3-4 and L4-5 Vertebrae: No acute fracture. L3-5 PLIF and L5-S1 posterior instrumented fusion. No perihardware lucency. Paraspinal and other soft tissues: Postsurgical changes within the dorsal midline soft tissues with  small amount of gas and fluid. Disc levels: Limited assessment of the spinal canal at the postsurgical levels. Neural foramina are patent. No visible spinal canal stenosis. IMPRESSION: 1. No acute fracture or static subluxation of the lumbar spine. 2. L3-5 PLIF and L5-S1 posterior instrumented fusion without evidence of hardware complication. Electronically Signed   By: Ulyses Jarred M.D.   On: 10/11/2020 21:18   DG CHEST PORT 1 VIEW  Result Date: 10/18/2020 CLINICAL DATA:  Cough with elevated temperature in a 70 year old female. EXAM: PORTABLE CHEST 1 VIEW COMPARISON:  October 11, 2020. FINDINGS: Spinal nerve stimulator as on the prior study projecting over the lower thoracic spine and over the upper abdomen. Leads in the soft tissues of the back on prior CT. Cardiomediastinal contours remain stable accounting for portable technique. Low lung volumes as before. No sign of lobar consolidation. Subtle interstitial and alveolar opacities with asymmetry LEFT greater than RIGHT. On limited assessment no acute skeletal process. IMPRESSION: Subtle interstitial and alveolar opacities in the lungs, LEFT greater than RIGHT. Findings could reflect asymmetric pulmonary edema versus atypical infection. No lobar consolidation or effusion. Electronically Signed   By:  Zetta Bills M.D.   On: 10/18/2020 09:27   DG Foot Complete Left  Result Date: 09/20/2020 CLINICAL DATA:  Left heel pain EXAM: LEFT FOOT - COMPLETE 3+ VIEW COMPARISON:  None. FINDINGS: No acute displaced fracture or malalignment. Small plantar calcaneal spur. No radiopaque foreign body. Hallux valgus deformity at the first MTP joint with mild degenerative changes. Mild bunion at the head of first metatarsal IMPRESSION: 1. No acute osseous abnormality.  Small plantar calcaneal spur 2. Hallux valgus deformity at the first MTP joint with mild degenerative change Electronically Signed   By: Donavan Foil M.D.   On: 09/20/2020 22:29   DG C-Arm 1-60 Min  Result Date: 10/09/2020 CLINICAL DATA:  Intraoperative fluoroscopy EXAM: LUMBAR SPINE - 2-3 VIEW; DG C-ARM 1-60 MIN COMPARISON:  08/04/2020 FINDINGS: Intraoperative fluoroscopy is obtained for surgical control purposes. Fluoroscopy time is recorded at 31.7 seconds. Three spot fluoroscopic images are obtained. Spot fluoroscopic images demonstrate previous posterior rod and screw fixation from L3 through L5 with intervertebral disc prostheses at L3-4 and L4-5. a localization marker is placed over the S1 vertebra. Subsequent images demonstrate additional posterior fixation to the sacrum. A posterior generator pack is also demonstrated. IMPRESSION: Intraoperative fluoroscopy obtained for surgical control purposes. Electronically Signed   By: Lucienne Capers M.D.   On: 10/09/2020 19:50   DG HIP UNILAT WITH PELVIS 1V RIGHT  Result Date: 10/12/2020 CLINICAL DATA:  Hip pain. EXAM: DG HIP (WITH OR WITHOUT PELVIS) 1V RIGHT COMPARISON:  None. FINDINGS: There is no evidence of hip fracture or dislocation. 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 Lower lumbosacral spine fusion. Battery apparatus overlies the right ilium. 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 No evidence of right hip fracture. Electronically Signed   By: Fidela Salisbury M.D.   On: 10/12/2020 11:14     Subjective: Some mild cough and inspiratory pain. No dyspnea. No hypoxia.  Discharge Exam: Vitals:   10/17/20 2104 10/18/20 0742  BP: 108/70 101/67  Pulse: (!) 105 97  Resp: 16 18  Temp: 100.3 F (37.9 C)   SpO2: 94% 90%   Vitals:   10/16/20 2025 10/17/20 0727 10/17/20 2104 10/18/20 0742  BP: 137/75 122/78 108/70 101/67  Pulse: 84 90 (!) 105 97  Resp: _0 Temp: 99.1 F (37.3 C) 98.4 F (  36.9 C) 100.3 F (37.9 C)   TempSrc:  Oral    SpO2: 96% 96% 94% 90%  Weight:      Height:        General: Pt is alert, awake, not in acute distress Cardiovascular: RRR, S1/S2 +, no rubs, no gallops Respiratory: CTA bilaterally, no wheezing, no rhonchi Abdominal: Soft, NT, ND, bowel sounds + Extremities: no edema, no cyanosis    The results of significant diagnostics from this hospitalization (including imaging, microbiology, ancillary and laboratory) are listed below for reference.     Microbiology: Recent Results (from the past 240 hour(s))  Urine culture     Status: Abnormal   Collection Time: 10/11/20  9:10 PM   Specimen: Urine, Random  Result Value Ref Range Status   Specimen Description URINE, RANDOM  Final   Special Requests NONE  Final   Culture (A)  Final    <10,000 COLONIES/mL INSIGNIFICANT GROWTH Performed at [facility], [address], [place], [place] [address]    Report Status 10/13/2020 FINAL  Final  SARS CORONAVIRUS 2 (TAT 6-24 HRS) Nasopharyngeal Nasopharyngeal Swab     Status: None   Collection Time: 10/12/20  2:30 AM   Specimen: Nasopharyngeal Swab  Result Value Ref Range Status   SARS Coronavirus 2 NEGATIVE NEGATIVE Final    Comment: (NOTE) SARS-CoV-2 target nucleic acids are NOT DETECTED.  The SARS-CoV-2 RNA is generally detectable in upper and lower respiratory specimens during the acute phase of infection. Negative results do not preclude SARS-CoV-2 infection, do not rule out co-infections  with other pathogens, and should not be used as the sole basis for treatment or other patient management decisions. Negative results must be combined with clinical observations, patient history, and epidemiological information. The expected result is Negative.  Fact Sheet for Patients: SugarRoll.be  Fact Sheet for Healthcare Providers: https://www.woods-mathews.com/  This test is not yet approved or cleared by the [place] FDA and  has been authorized for detection and/or diagnosis of SARS-CoV-2 by FDA under an Emergency Use Authorization (EUA). This EUA will remain  in effect (meaning this test can be used) for the duration of the COVID-19 declaration under Se ction 564(b)(1) of the Act, 21 U.S.C. section 360bbb-3(b)(1), unless the authorization is terminated or revoked sooner.  Performed at [facility], [place] [address] [place], [place] [address]   SARS CORONAVIRUS 2 (TAT 6-24 HRS) Nasopharyngeal Nasopharyngeal Swab     Status: None   Collection Time: 10/17/20  9:36 AM   Specimen: Nasopharyngeal Swab  Result Value Ref Range Status   SARS Coronavirus 2 NEGATIVE NEGATIVE Final    Comment: (NOTE) SARS-CoV-2 target nucleic acids are NOT DETECTED.  The SARS-CoV-2 RNA is generally detectable in upper and lower respiratory specimens during the acute phase of infection. Negative results do not preclude SARS-CoV-2 infection, do not rule out co-infections with other pathogens, and should not be used as the sole basis for treatment or other patient management decisions. Negative results must be combined with clinical observations, patient history, and epidemiological information. The expected result is Negative.  Fact Sheet for Patients: SugarRoll.be  Fact Sheet for Healthcare Providers: https://www.woods-mathews.com/  This test is not yet approved or cleared by the [place] FDA and   has been authorized for detection and/or diagnosis of SARS-CoV-2 by FDA under an Emergency Use Authorization (EUA). This EUA will remain  in effect (meaning this test can be used) for the duration of the COVID-19 declaration under Se ction 564(b)(1)  of the Act, 21 U.S.C. section 360bbb-3(b)(1), unless the authorization is terminated or revoked sooner.  Performed at [facility], [place] [address], [place], [place] [address]      Labs: BNP (last 3 results) No results for input(s): BNP in the last 8760 hours. Basic Metabolic Panel: Recent Labs  Lab 10/12/20 0025 10/12/20 0800 10/13/20 0257 10/14/20 0358 10/15/20 0343 10/16/20 0210  NA  --  141 143 143 141 142  K  --  3.3* 3.8 3.1* 2.9* 3.4*  CL  --  111 113* 110 110 113*  CO2  --  _0 GLUCOSE  --  94 89 149* 147* 105*  BUN  --  12 12 7* <5* <5*  CREATININE  --  0.63 0.60 0.53 0.53 0.56  CALCIUM  --  7.8* 8.0* 7.9* 7.9* 8.1*  MG 1.9  --  2.1 1.8 1.7  --   PHOS 2.6  --  2.6 2.8 3.2  --    Liver Function Tests: Recent Labs  Lab 10/11/20 2025 10/13/20 0257 10/14/20 0358 10/15/20 0343  AST 51* 35 29 24  ALT _1 ALKPHOS 64 58 50 52  BILITOT 0.9 1.0 0.6 0.1*  PROT 6.3* 6.1* 5.5* 5.2*  ALBUMIN 3.2* 2.7* 2.4* 2.4*   No results for input(s): LIPASE, AMYLASE in the last 168 hours. No results for input(s): AMMONIA in the last 168 hours. CBC: Recent Labs  Lab 10/11/20 2025 10/13/20 0257 10/14/20 0358 10/15/20 0343  WBC 17.2* 10.4 8.7 8.2  NEUTROABS 11.9* 5.8 4.7 4.4  HGB 10.8* 9.8* 9.0* 9.2*  HCT 35.5* 32.5* 29.6* 29.7*  MCV 74.6* 75.4* 74.7* 74.6*  PLT 244 193 211 233   Cardiac Enzymes: Recent Labs  Lab 10/11/20 2025 10/12/20 0800 10/13/20 0257 10/14/20 0358 10/15/20 0343  CKTOTAL 1,755* 832* 873* 781* 589*   BNP: Invalid input(s): POCBNP CBG: Recent Labs  Lab 10/17/20 0558 10/17/20 1221 10/17/20 1708 10/17/20 2230 10/18/20 0543  GLUCAP 115* 121* 125* 141* 149*    D-Dimer No results for input(s): DDIMER in the last 72 hours. Hgb A1c No results for input(s): HGBA1C in the last 72 hours. Lipid Profile No results for input(s): CHOL, HDL, LDLCALC, TRIG, CHOLHDL, LDLDIRECT in the last 72 hours. Thyroid function studies No results for input(s): TSH, T4TOTAL, T3FREE, THYROIDAB in the last 72 hours.  Invalid input(s): FREET3 Anemia work up No results for input(s): VITAMINB12, FOLATE, FERRITIN, TIBC, IRON, RETICCTPCT in the last 72 hours. Urinalysis    Component Value Date/Time   COLORURINE YELLOW 10/11/2020 2110   APPEARANCEUR HAZY (A) 10/11/2020 2110   LABSPEC 1.029 10/11/2020 2110   PHURINE 5.0 10/11/2020 2110   GLUCOSEU >=500 (A) 10/11/2020 2110   HGBUR NEGATIVE 10/11/2020 2110   BILIRUBINUR NEGATIVE 10/11/2020 2110   BILIRUBINUR negative 07/16/2019 0830   KETONESUR 80 (A) 10/11/2020 2110   PROTEINUR NEGATIVE 10/11/2020 2110   UROBILINOGEN 0.2 07/16/2019 0830   NITRITE NEGATIVE 10/11/2020 2110   LEUKOCYTESUR NEGATIVE 10/11/2020 2110   Sepsis Labs Invalid input(s): PROCALCITONIN,  WBC,  LACTICIDVEN Microbiology Recent Results (from the past 240 hour(s))  Urine culture     Status: Abnormal   Collection Time: 10/11/20  9:10 PM   Specimen: Urine, Random  Result Value Ref Range Status   Specimen Description URINE, RANDOM  Final   Special Requests NONE  Final   Culture (A)  Final    <10,000 COLONIES/mL INSIGNIFICANT GROWTH Performed at [facility],  [address], [place], [place] [address]    Report Status 10/13/2020 FINAL  Final  SARS CORONAVIRUS 2 (TAT 6-24 HRS) Nasopharyngeal Nasopharyngeal Swab     Status: None   Collection Time: 10/12/20  2:30 AM   Specimen: Nasopharyngeal Swab  Result Value Ref Range Status   SARS Coronavirus 2 NEGATIVE NEGATIVE Final    Comment: (NOTE) SARS-CoV-2 target nucleic acids are NOT DETECTED.  The SARS-CoV-2 RNA is generally detectable in upper and lower respiratory specimens during the  acute phase of infection. Negative results do not preclude SARS-CoV-2 infection, do not rule out co-infections with other pathogens, and should not be used as the sole basis for treatment or other patient management decisions. Negative results must be combined with clinical observations, patient history, and epidemiological information. The expected result is Negative.  Fact Sheet for Patients: SugarRoll.be  Fact Sheet for Healthcare Providers: https://www.woods-mathews.com/  This test is not yet approved or cleared by the [place] FDA and  has been authorized for detection and/or diagnosis of SARS-CoV-2 by FDA under an Emergency Use Authorization (EUA). This EUA will remain  in effect (meaning this test can be used) for the duration of the COVID-19 declaration under Se ction 564(b)(1) of the Act, 21 U.S.C. section 360bbb-3(b)(1), unless the authorization is terminated or revoked sooner.  Performed at [facility], [place] [address], [place], [place] [address]   SARS CORONAVIRUS 2 (TAT 6-24 HRS) Nasopharyngeal Nasopharyngeal Swab     Status: None   Collection Time: 10/17/20  9:36 AM   Specimen: Nasopharyngeal Swab  Result Value Ref Range Status   SARS Coronavirus 2 NEGATIVE NEGATIVE Final    Comment: (NOTE) SARS-CoV-2 target nucleic acids are NOT DETECTED.  The SARS-CoV-2 RNA is generally detectable in upper and lower respiratory specimens during the acute phase of infection. Negative results do not preclude SARS-CoV-2 infection, do not rule out co-infections with other pathogens, and should not be used as the sole basis for treatment or other patient management decisions. Negative results must be combined with clinical observations, patient history, and epidemiological information. The expected result is Negative.  Fact Sheet for Patients: SugarRoll.be  Fact Sheet for Healthcare  Providers: https://www.woods-mathews.com/  This test is not yet approved or cleared by the [place] FDA and  has been authorized for detection and/or diagnosis of SARS-CoV-2 by FDA under an Emergency Use Authorization (EUA). This EUA will remain  in effect (meaning this test can be used) for the duration of the COVID-19 declaration under Se ction 564(b)(1) of the Act, 21 U.S.C. section 360bbb-3(b)(1), unless the authorization is terminated or revoked sooner.  Performed at [facility], [place] [address], [place], [place] [address]      Time coordinating discharge: 35 minutes  SIGNED:   Cordelia Poche, MD [facility] 10/18/2020, 10:00 AM

## 2020-10-18 NOTE — TOC Progression Note (Signed)
Transition of Care [facility]) - Progression Note    Patient Details  Name: Amanda Morris MRN: [id number] Date of Birth: 08/21/1950  Transition of Care [facility]) CM/SW [place], [place] Phone Number: 10/18/2020, 9:57 AM  Clinical Narrative:    Awaiting on discharge summary.  Physician aware.  TOC will continue to assist and follow for needs.   Expected Discharge Plan: Skilled Nursing Facility Barriers to Discharge: Continued Medical Work up, SNF Pending bed offer  Expected Discharge Plan and Services Expected Discharge Plan: [place] In-house Referral: Clinical Social Work     Living arrangements for the past 2 months: Apartment                                       Social Determinants of Health (SDOH) Interventions    Readmission Risk Interventions No flowsheet data found.

## 2020-10-20 DIAGNOSIS — K219 Gastro-esophageal reflux disease without esophagitis: Secondary | ICD-10-CM | POA: Diagnosis not present

## 2020-10-20 DIAGNOSIS — R531 Weakness: Secondary | ICD-10-CM | POA: Diagnosis not present

## 2020-10-20 DIAGNOSIS — E119 Type 2 diabetes mellitus without complications: Secondary | ICD-10-CM | POA: Diagnosis not present

## 2020-10-20 DIAGNOSIS — K589 Irritable bowel syndrome without diarrhea: Secondary | ICD-10-CM | POA: Diagnosis not present

## 2020-10-20 DIAGNOSIS — Z794 Long term (current) use of insulin: Secondary | ICD-10-CM | POA: Diagnosis not present

## 2020-10-20 DIAGNOSIS — M79605 Pain in left leg: Secondary | ICD-10-CM | POA: Diagnosis not present

## 2020-10-20 DIAGNOSIS — G8929 Other chronic pain: Secondary | ICD-10-CM | POA: Diagnosis not present

## 2020-10-20 DIAGNOSIS — M79604 Pain in right leg: Secondary | ICD-10-CM | POA: Diagnosis not present

## 2020-10-20 DIAGNOSIS — R1013 Epigastric pain: Secondary | ICD-10-CM | POA: Diagnosis not present

## 2020-10-20 DIAGNOSIS — G629 Polyneuropathy, unspecified: Secondary | ICD-10-CM | POA: Diagnosis not present

## 2020-10-20 DIAGNOSIS — W19XXXD Unspecified fall, subsequent encounter: Secondary | ICD-10-CM | POA: Diagnosis not present

## 2020-10-20 DIAGNOSIS — Z9889 Other specified postprocedural states: Secondary | ICD-10-CM | POA: Diagnosis not present

## 2020-10-28 DIAGNOSIS — K589 Irritable bowel syndrome without diarrhea: Secondary | ICD-10-CM | POA: Diagnosis not present

## 2020-10-28 DIAGNOSIS — M549 Dorsalgia, unspecified: Secondary | ICD-10-CM | POA: Diagnosis not present

## 2020-10-28 DIAGNOSIS — Z9889 Other specified postprocedural states: Secondary | ICD-10-CM | POA: Diagnosis not present

## 2020-10-28 DIAGNOSIS — Z794 Long term (current) use of insulin: Secondary | ICD-10-CM | POA: Diagnosis not present

## 2020-10-28 DIAGNOSIS — Z7984 Long term (current) use of oral hypoglycemic drugs: Secondary | ICD-10-CM | POA: Diagnosis not present

## 2020-10-28 DIAGNOSIS — E1165 Type 2 diabetes mellitus with hyperglycemia: Secondary | ICD-10-CM | POA: Diagnosis not present

## 2020-10-28 DIAGNOSIS — K219 Gastro-esophageal reflux disease without esophagitis: Secondary | ICD-10-CM | POA: Diagnosis not present

## 2020-10-30 DIAGNOSIS — M4316 Spondylolisthesis, lumbar region: Secondary | ICD-10-CM | POA: Diagnosis not present

## 2020-10-31 DIAGNOSIS — E119 Type 2 diabetes mellitus without complications: Secondary | ICD-10-CM | POA: Diagnosis not present

## 2020-10-31 DIAGNOSIS — W19XXXD Unspecified fall, subsequent encounter: Secondary | ICD-10-CM | POA: Diagnosis not present

## 2020-10-31 DIAGNOSIS — M79605 Pain in left leg: Secondary | ICD-10-CM | POA: Diagnosis not present

## 2020-10-31 DIAGNOSIS — G8929 Other chronic pain: Secondary | ICD-10-CM | POA: Diagnosis not present

## 2020-10-31 DIAGNOSIS — M79604 Pain in right leg: Secondary | ICD-10-CM | POA: Diagnosis not present

## 2020-10-31 DIAGNOSIS — K219 Gastro-esophageal reflux disease without esophagitis: Secondary | ICD-10-CM | POA: Diagnosis not present

## 2020-10-31 DIAGNOSIS — Z794 Long term (current) use of insulin: Secondary | ICD-10-CM | POA: Diagnosis not present

## 2020-10-31 DIAGNOSIS — Z7984 Long term (current) use of oral hypoglycemic drugs: Secondary | ICD-10-CM | POA: Diagnosis not present

## 2020-10-31 DIAGNOSIS — R531 Weakness: Secondary | ICD-10-CM | POA: Diagnosis not present

## 2020-10-31 DIAGNOSIS — G629 Polyneuropathy, unspecified: Secondary | ICD-10-CM | POA: Diagnosis not present

## 2020-10-31 DIAGNOSIS — K589 Irritable bowel syndrome without diarrhea: Secondary | ICD-10-CM | POA: Diagnosis not present

## 2020-10-31 DIAGNOSIS — R1013 Epigastric pain: Secondary | ICD-10-CM | POA: Diagnosis not present

## 2020-11-02 ENCOUNTER — Other Ambulatory Visit: Payer: Self-pay | Admitting: Family Medicine

## 2020-11-04 DIAGNOSIS — M6282 Rhabdomyolysis: Secondary | ICD-10-CM | POA: Diagnosis not present

## 2020-11-18 ENCOUNTER — Other Ambulatory Visit: Payer: Self-pay

## 2020-11-18 ENCOUNTER — Ambulatory Visit (INDEPENDENT_AMBULATORY_CARE_PROVIDER_SITE_OTHER): Payer: Medicare Other | Admitting: Family Medicine

## 2020-11-18 ENCOUNTER — Encounter: Payer: Self-pay | Admitting: Family Medicine

## 2020-11-18 VITALS — BP 102/65 | HR 93 | Ht 64.0 in | Wt 215.0 lb

## 2020-11-18 DIAGNOSIS — I10 Essential (primary) hypertension: Secondary | ICD-10-CM

## 2020-11-18 DIAGNOSIS — M5441 Lumbago with sciatica, right side: Secondary | ICD-10-CM | POA: Diagnosis not present

## 2020-11-18 DIAGNOSIS — G8929 Other chronic pain: Secondary | ICD-10-CM | POA: Diagnosis not present

## 2020-11-18 DIAGNOSIS — M5442 Lumbago with sciatica, left side: Secondary | ICD-10-CM | POA: Diagnosis not present

## 2020-11-18 DIAGNOSIS — E1169 Type 2 diabetes mellitus with other specified complication: Secondary | ICD-10-CM

## 2020-11-18 LAB — POCT URINALYSIS DIP (CLINITEK)
Bilirubin, UA: NEGATIVE
Blood, UA: NEGATIVE
Glucose, UA: >=1000 mg/dL — AB
Ketones, POC UA: NEGATIVE mg/dL
Leukocytes, UA: NEGATIVE
Nitrite, UA: NEGATIVE
POC PROTEIN,UA: NEGATIVE
Spec Grav, UA: 1.01 (ref 1.010–1.025)
Urobilinogen, UA: 0.2 E.U./dL
pH, UA: 6 (ref 5.0–8.0)

## 2020-11-18 LAB — BASIC METABOLIC PANEL WITH GFR
BUN/Creatinine Ratio: 8 (calc) (ref 6–22)
BUN: 6 mg/dL — ABNORMAL LOW (ref 7–25)
CO2: 28 mmol/L (ref 20–32)
Calcium: 8.9 mg/dL (ref 8.6–10.4)
Chloride: 106 mmol/L (ref 98–110)
Creat: 0.76 mg/dL (ref 0.50–1.05)
Glucose, Bld: 74 mg/dL (ref 65–99)
Potassium: 4.7 mmol/L (ref 3.5–5.3)
Sodium: 142 mmol/L (ref 135–146)
eGFR: 85 mL/min/{1.73_m2} (ref >=60)

## 2020-11-18 LAB — CBC
HCT: 38.5 % (ref 35.0–45.0)
Hemoglobin: 11.1 g/dL — ABNORMAL LOW (ref 11.7–15.5)
MCH: 20.7 pg — ABNORMAL LOW (ref 27.0–33.0)
MCHC: 28.8 g/dL — ABNORMAL LOW (ref 32.0–36.0)
MCV: 71.7 fL — ABNORMAL LOW (ref 80.0–100.0)
MPV: 11.7 fL (ref 7.5–12.5)
Platelets: 429 10*3/uL — ABNORMAL HIGH (ref 140–400)
RBC: 5.37 10*6/uL — ABNORMAL HIGH (ref 3.80–5.10)
RDW: 15.9 % — ABNORMAL HIGH (ref 11.0–15.0)
WBC: 10.2 10*3/uL (ref 3.8–10.8)

## 2020-11-18 NOTE — Patient Instructions (Signed)
Please drop your Lantus down to 20 units.  If you are still having blood sugars less than 80 over the next week then drop down again to 16 units.  Okay to add 1000 mg of Tylenol, 3 times a day for improved pain relief.  This is safe to take with your tramadol.  Still would encourage you to call the surgeons office and let them know what is going on with your pain levels.

## 2020-11-18 NOTE — Assessment & Plan Note (Addendum)
Well controlled.  Only off of all blood pressure medications.  Follow up in  6 mo

## 2020-11-18 NOTE — Progress Notes (Signed)
Established Patient Office Visit  Subjective:  Patient ID: Amanda Morris, female    DOB: Nov 27, 1950  Age: 70 y.o. MRN: [id number]  CC:  Chief Complaint  Patient presents with   Follow-up    3 month DM follow up: c/o bilateral hip pain     HPI Amanda Morris presents for   Diabetes - no hypoglycemic events. No wounds or sores that are not healing well. No increased thirst or urination. Checking glucose at home. Taking medications as prescribed without any side effects.  She also had back surgery, decompression of L5 and S1 with rods placed.  With Dr. Christella Noa on June 16.  She ended up going to the emergency room and was admitted overnight after she felt suddenly weak and dropped to the ground.  She did not injure her head.  She did have some rhabdomyolysis which was treated with IV fluids.  They did do an A1c which was 6.8 and they did decrease her Lantus to 10 units daily and discontinued her glipizide.  She was also diagnosed with possible atypical pneumonia and discharged azithromycin.  Past Medical History:  Diagnosis Date   Allergy    Diabetes mellitus without complication ([place])    type 2   GERD (gastroesophageal reflux disease)    Headache    History of hiatal hernia    Hypercholesterolemia    Numbness    left, outer thigh   Palpitations    in the past, no current issues per patient   Postlaminectomy syndrome     Past Surgical History:  Procedure Laterality Date   ABDOMINAL HYSTERECTOMY  1982   BACK SURGERY     x 2    BREAST REDUCTION SURGERY Bilateral 03/17/2016   Procedure: MAMMARY REDUCTION  (BREAST);  Surgeon: Wallace Going, DO;  Location: [place];  Service: Plastics;  Laterality: Bilateral;   BREAST SURGERY     COLONOSCOPY     LUMBAR WOUND DEBRIDEMENT N/A 05/22/2020   Procedure: Incision and drainage of lumbar wound;  Surgeon: Ashok Pall, MD;  Location: [place];  Service: Neurosurgery;  Laterality: N/A;   SHOULDER  SURGERY  06/2008, 09/2010   Right, by Dr. Karie Soda, then Dr. Judeth Horn   SPINAL CORD STIMULATOR INSERTION N/A 12/02/2017   Procedure: LUMBAR SPINAL CORD STIMULATOR INSERTION;  Surgeon: Ashok Pall, MD;  Location: [place];  Service: Neurosurgery;  Laterality: N/A;   SPINAL CORD STIMULATOR TRIAL N/A 11/25/2017   Procedure: INSERTION LUMBAR SPINAL CORD STIMULATOR TRIAL  ;  Surgeon: Ashok Pall, MD;  Location: [place];  Service: Neurosurgery;  Laterality: N/A;   INSERTION LUMBAR SPINAL CORD STIMULATOR TRIAL     UPPER GI ENDOSCOPY      Family History  Problem Relation Age of Onset   Heart disease Mother    Diabetes Mother    Hypertension Mother    Stroke Mother    Heart disease Father    Heart disease Sister    Diabetes Sister    Hyperlipidemia Sister    Hypertension Sister    Stroke Sister    Alcohol abuse Brother    Hyperlipidemia Brother     Social History   Socioeconomic History   Marital status: Divorced    Spouse name: Not on file   Number of children: 1   Years of education: 9th grade   Highest education level: 9th grade  Occupational History   Occupation: Disabled.     Comment: retiredAeronautical engineer at [employer]  Smoking status: Former    Types: Cigarettes    Quit date: 07/30/2007    Years since quitting: 13.3   Smokeless tobacco: Never  Vaping Use   Vaping Use: Never used  Substance and Sexual Activity   Alcohol use: No   Drug use: No   Sexual activity: Not Currently    Birth control/protection: Surgical    Comment: Hysterectomy  Other Topics Concern   Not on file  Social History Narrative   Lives alone.   Right-handed.   No daily caffeine use.   Social Determinants of Health   Financial Resource Strain: Not on file  Food Insecurity: Not on file  Transportation Needs: Not on file  Physical Activity: Not on file  Stress: Not on file  Social Connections: Not on file  Intimate Partner Violence: Not on file    Outpatient Medications Prior to  Visit  Medication Sig Dispense Refill   ACCU-CHEK AVIVA PLUS test strip FOR TESTING BLOOD SUGARS 3 TIMES DAILY. DX:E11.9 300 strip 12   Accu-Chek Softclix Lancets lancets Dx:E11.9 100 each PRN   aspirin EC 81 MG tablet Take 1 tablet (81 mg total) by mouth daily.     B-D ULTRAFINE III SHORT PEN 31G X 8 MM MISC USE AS DIRECTED 100 each 6   Blood Glucose Monitoring Suppl (ACCU-CHEK AVIVA PLUS) w/Device KIT Check blood sugars 3 times daily DX:E11.9 1 kit prn   cholecalciferol (VITAMIN D) 1000 units tablet Take 1,000 Units by mouth daily.     dexlansoprazole (DEXILANT) 60 MG capsule Take 60 mg by mouth daily.     dicyclomine (BENTYL) 20 MG tablet Take 20 mg by mouth every 6 (six) hours as needed for spasms (cramps/stomach pain).     doxepin (SINEQUAN) 10 MG capsule Take 10 mg by mouth at bedtime.     empagliflozin (JARDIANCE) 25 MG TABS tablet TAKE 25 MG BY MOUTH DAILY. 90 tablet 1   fluticasone (FLONASE) 50 MCG/ACT nasal spray PLACE 1 SPRAY INTO BOTH NOSTRILS DAILY. 48 mL 4   insulin glargine (LANTUS SOLOSTAR) 100 UNIT/ML Solostar Pen Inject 10 Units into the skin daily.     Krill Oil 500 MG CAPS Take 500 mg by mouth daily.     levocetirizine (XYZAL) 5 MG tablet Take 1 tablet (5 mg total) by mouth every evening. 90 tablet 3   Multiple Vitamin (MULTIVITAMIN WITH MINERALS) TABS tablet Take 1 tablet by mouth daily.     OVER THE COUNTER MEDICATION Take 1 tablet by mouth 3 (three) times daily. Golo otc weight loss supplement     perphenazine (TRILAFON) 8 MG tablet Take 8 mg by mouth at bedtime.  2   PNEUMOVAX 23 25 MCG/0.5ML injection      pravastatin (PRAVACHOL) 40 MG tablet Take 1 tablet (40 mg total) by mouth at bedtime. 90 tablet 3   pregabalin (LYRICA) 200 MG capsule TAKE 1 CAPSULE (200 MG TOTAL) BY MOUTH 3 (THREE) TIMES DAILY. 90 capsule 5   tiZANidine (ZANAFLEX) 4 MG tablet Take 1 tablet (4 mg total) by mouth every 6 (six) hours as needed for muscle spasms. 60 tablet 0   traMADol (ULTRAM) 50 MG  tablet Take 50 mg by mouth 4 (four) times daily as needed.     fentaNYL (DURAGESIC) 50 MCG/HR Place 1 patch onto the skin every 3 (three) days. 1 patch 0   HYDROcodone-acetaminophen (NORCO/VICODIN) 5-325 MG tablet Take 1-2 tablets by mouth every 4 (four) hours as needed for moderate pain. 10 tablet 0  ondansetron (ZOFRAN ODT) 4 MG disintegrating tablet Take 1 tablet (4 mg total) by mouth every 8 (eight) hours as needed for nausea or vomiting. 20 tablet 0   No facility-administered medications prior to visit.    Allergies  Allergen Reactions   Penicillins Hives and Other (See Comments)    PATIENT HAS HAD A PCN REACTION WITH IMMEDIATE RASH, FACIAL/TONGUE/THROAT SWELLING, SOB, OR LIGHTHEADEDNESS WITH HYPOTENSION:  #  #  YES  #  #  Has patient had a PCN reaction causing severe rash involving mucus membranes or skin necrosis: No Has patient had a PCN reaction that required hospitalization: No Has patient had a PCN reaction occurring within the last 10 years: No If all of the above answers are "NO", then may proceed with Cephalosporin use.    Duloxetine Other (See Comments)    GI upset   Aspirin Nausea Only   Codeine Itching   Hydrocodone Nausea Only and Other (See Comments)    Nose bleed   Metformin And Related Diarrhea and Nausea Only   Oxycodone Nausea And Vomiting    GI Upset (intolerance)   Victoza [Liraglutide] Other (See Comments)    Abdominal pain    ROS Review of Systems    Objective:    Physical Exam  BP 102/65   Pulse 93   Ht _0  (1.626 m)   Wt 215 lb (97.5 kg)   LMP  (LMP Unknown)   SpO2 100%   BMI 36.90 kg/m  Wt Readings from Last 3 Encounters:  11/18/20 215 lb (97.5 kg)  10/11/20 220 lb 7.4 oz (100 kg)  10/09/20 220 lb (99.8 kg)     Health Maintenance Due  Topic Date Due   COVID-19 Vaccine (4 - Booster for Moderna series) 07/23/2020   FOOT EXAM  08/16/2020    There are no preventive care reminders to display for this patient.  Lab Results   Component Value Date   TSH 1.650 11/28/2019   Lab Results  Component Value Date   WBC 8.2 10/15/2020   HGB 9.2 (L) 10/15/2020   HCT 29.7 (L) 10/15/2020   MCV 74.6 (L) 10/15/2020   PLT 233 10/15/2020   Lab Results  Component Value Date   NA 142 10/16/2020   K 3.4 (L) 10/16/2020   CO2 23 10/16/2020   GLUCOSE 105 (H) 10/16/2020   BUN <5 (L) 10/16/2020   CREATININE 0.56 10/16/2020   BILITOT 0.1 (L) 10/15/2020   ALKPHOS 52 10/15/2020   AST 24 10/15/2020   ALT 22 10/15/2020   PROT 5.2 (L) 10/15/2020   ALBUMIN 2.4 (L) 10/15/2020   CALCIUM 8.1 (L) 10/16/2020   ANIONGAP 6 10/16/2020   Lab Results  Component Value Date   CHOL 127 10/13/2020   Lab Results  Component Value Date   HDL 32 (L) 10/13/2020   Lab Results  Component Value Date   LDLCALC 75 10/13/2020   Lab Results  Component Value Date   TRIG 99 10/13/2020   Lab Results  Component Value Date   CHOLHDL 4.0 10/13/2020   Lab Results  Component Value Date   HGBA1C 6.8 (H) 10/13/2020      Assessment & Plan:   Problem List Items Addressed This Visit       Cardiovascular and Mediastinum   Essential hypertension    Well controlled.  Only off of all blood pressure medications.  Follow up in  6 mo        Relevant Orders   BASIC METABOLIC PANEL  WITH GFR   CBC     Endocrine   Type 2 diabetes mellitus with other specified complication ([place]) - Primary    Did review her glucose log that she brought in today and looks good overall except for some lows over the last 4 days she is also become more active over the last couple of days she was pretty sedentary when she first got home from her surgery.  We discussed cutting her Lantus back to 20 units she has been using 24.  If she continues to have low blood sugars then encouraged her to cut back to 16.  She is back on her glipizide as well.       Relevant Orders   BASIC METABOLIC PANEL WITH GFR   CBC     Nervous and Auditory   Chronic midline low back pain  with bilateral sciatica    I did encourage her to reach out to the surgeons office since they are prescribing the tramadol since it makes her feel off.  To see if there is something else they might be able to use for pain in the meantime I did encourage her to add 1000 g of Tylenol 3 times a day to her current regimen.       Relevant Medications   traMADol (ULTRAM) 50 MG tablet    The hospital did want to check a CBC and BMP at follow-up.  We will go ahead and recheck those today.  No orders of the defined types were placed in this encounter.   Follow-up: Return in about 3 months (around 02/18/2021) for Diabetes follow-up.    Beatrice Lecher, MD

## 2020-11-18 NOTE — Assessment & Plan Note (Signed)
Did review her glucose log that she brought in today and looks good overall except for some lows over the last 4 days she is also become more active over the last couple of days she was pretty sedentary when she first got home from her surgery.  We discussed cutting her Lantus back to 20 units she has been using 24.  If she continues to have low blood sugars then encouraged her to cut back to 16.  She is back on her glipizide as well.

## 2020-11-18 NOTE — Addendum Note (Signed)
Addended by: Teddy Spike on: 11/18/2020 01:03 PM   Modules accepted: Orders

## 2020-11-18 NOTE — Assessment & Plan Note (Signed)
I did encourage her to reach out to the surgeons office since they are prescribing the tramadol since it makes her feel off.  To see if there is something else they might be able to use for pain in the meantime I did encourage her to add 1000 g of Tylenol 3 times a day to her current regimen.

## 2020-11-26 ENCOUNTER — Telehealth: Payer: Self-pay

## 2020-11-26 NOTE — Telephone Encounter (Signed)
Pt called stating that she takes Tylenol Arthritis and that Dr. Arneta Cliche has now prescribed tramadol.  Pt wanted to make sure that it is okay to take both of these medications together.  Advised pt that she should contact Dr. Malachy Moan office since he prescribed the tramadol to see if they want to change medication or can advise if there are no drug interactions between these 2 medications.  Pt expressed understanding and stated that she will call his office tomorrow.  Charyl Bigger, CMA

## 2020-11-30 ENCOUNTER — Other Ambulatory Visit: Payer: Self-pay | Admitting: Neurology

## 2020-12-02 NOTE — Telephone Encounter (Signed)
Spoke w/pt and advised her that she should stop taking the Tramadol since she is not getting any relief from her pain when she take this and it causes her hand to shake and causes her to be nervous.   I asked her if the Arthritis Strength Tylenol helps with her pain and she stated that it does. I advised her to continue to take that for the pain and to let Dr. Arneta Cliche know about her pain and how the medication that he gave her has not helped.

## 2020-12-02 NOTE — Addendum Note (Signed)
Addended by: Teddy Spike on: 12/02/2020 02:49 PM   Modules accepted: Orders

## 2020-12-05 DIAGNOSIS — M6282 Rhabdomyolysis: Secondary | ICD-10-CM | POA: Diagnosis not present

## 2020-12-08 ENCOUNTER — Ambulatory Visit: Payer: Medicare Other | Admitting: Family Medicine

## 2020-12-09 ENCOUNTER — Telehealth: Payer: Self-pay | Admitting: Lab

## 2020-12-09 NOTE — Progress Notes (Signed)
  Chronic Care Management   Outreach Note  12/09/2020 Name: Amanda Morris MRN: [id number] DOB: 05-01-1950  Referred by: Hali Marry, MD Reason for referral : Medication Management   An unsuccessful telephone outreach was attempted today. The patient was referred to the pharmacist for assistance with care management and care coordination.   Follow Up Plan:   [place]

## 2020-12-22 ENCOUNTER — Telehealth: Payer: Self-pay

## 2020-12-22 NOTE — Telephone Encounter (Signed)
Pt called requesting return call, but did not state reason.  Attempted to return call how VM box has not been set up.  Unable to leave a message.  Charyl Bigger, CMA

## 2020-12-23 ENCOUNTER — Telehealth: Payer: Self-pay | Admitting: Lab

## 2020-12-23 NOTE — Telephone Encounter (Signed)
Attempted to reach pt again by phone.  VM box has not been set up.  Unable to leave a message.  Charyl Bigger, CMA

## 2020-12-23 NOTE — Progress Notes (Signed)
  Chronic Care Management   Outreach Note  12/23/2020 Name: Amanda Morris MRN: [id number] DOB: [birth year]  Referred by: Hali Marry, MD Reason for referral : Medication Management   Third unsuccessful telephone outreach was attempted today. The patient was referred to the pharmacist for assistance with care management and care coordination.   Follow Up Plan:   [place]

## 2020-12-24 NOTE — Telephone Encounter (Signed)
Attempted to reach pt again by phone (3rd attempt).  VM box has not been set up.  Unable to leave a message.  Charyl Bigger, CMA

## 2020-12-25 ENCOUNTER — Ambulatory Visit (INDEPENDENT_AMBULATORY_CARE_PROVIDER_SITE_OTHER): Payer: Medicare Other | Admitting: Family Medicine

## 2020-12-25 ENCOUNTER — Other Ambulatory Visit: Payer: Self-pay

## 2020-12-25 ENCOUNTER — Encounter: Payer: Self-pay | Admitting: Family Medicine

## 2020-12-25 VITALS — BP 111/68 | HR 89 | Ht 64.0 in | Wt 222.0 lb

## 2020-12-25 DIAGNOSIS — R600 Localized edema: Secondary | ICD-10-CM

## 2020-12-25 DIAGNOSIS — I10 Essential (primary) hypertension: Secondary | ICD-10-CM

## 2020-12-25 DIAGNOSIS — E1169 Type 2 diabetes mellitus with other specified complication: Secondary | ICD-10-CM

## 2020-12-25 DIAGNOSIS — Z23 Encounter for immunization: Secondary | ICD-10-CM

## 2020-12-25 DIAGNOSIS — D509 Iron deficiency anemia, unspecified: Secondary | ICD-10-CM | POA: Diagnosis not present

## 2020-12-25 MED ORDER — HYDROCHLOROTHIAZIDE 25 MG PO TABS: 25.0000 mg | ORAL_TABLET | Freq: Every day | ORAL | 0 refills | Status: DC | PRN

## 2020-12-25 NOTE — Progress Notes (Signed)
Acute Office Visit  Subjective:    Patient ID: Amanda Morris, female    DOB: 13-Apr-1951, 70 y.o.   MRN: [id number]  Chief Complaint  Patient presents with   Foot Swelling   Leg Swelling   Foot Pain    HPI Patient is in today for foot pain and swelling x 2 weeks.  She says around that time she had been taking a lot of Tylenol arthritis and wondered if that might even be causing it so she actually stopped it about a week ago but really has not noticed any improvement in her swelling.  We did change statins in February but she has been on it for 6 months and has not had any issues otherwise no other medication changes.  She denies any recent chest pain or shortness of breath she says the left is swelling a little worse than the right.  By the end of the day they are both swollen she denies any changes in salt intake she said she actually tries to avoid salt.  She feels like she is urinating normally she has not noticed any decrease in urination.  She says it does help if she props her feet up.  Past Medical History:  Diagnosis Date   Allergy    Diabetes mellitus without complication ([place])    type 2   GERD (gastroesophageal reflux disease)    Headache    History of hiatal hernia    Hypercholesterolemia    Numbness    left, outer thigh   Palpitations    in the past, no current issues per patient   Postlaminectomy syndrome     Past Surgical History:  Procedure Laterality Date   ABDOMINAL HYSTERECTOMY  1982   BACK SURGERY     x 2    BREAST REDUCTION SURGERY Bilateral 03/17/2016   Procedure: MAMMARY REDUCTION  (BREAST);  Surgeon: Wallace Going, DO;  Location: [place];  Service: Plastics;  Laterality: Bilateral;   BREAST SURGERY     COLONOSCOPY     LUMBAR WOUND DEBRIDEMENT N/A 05/22/2020   Procedure: Incision and drainage of lumbar wound;  Surgeon: Ashok Pall, MD;  Location: [place];  Service: Neurosurgery;  Laterality: N/A;   SHOULDER SURGERY   06/2008, 09/2010   Right, by Dr. Karie Soda, then Dr. Judeth Horn   SPINAL CORD STIMULATOR INSERTION N/A 12/02/2017   Procedure: LUMBAR SPINAL CORD STIMULATOR INSERTION;  Surgeon: Ashok Pall, MD;  Location: [facility];  Service: Neurosurgery;  Laterality: N/A;   SPINAL CORD STIMULATOR TRIAL N/A 11/25/2017   Procedure: INSERTION LUMBAR SPINAL CORD STIMULATOR TRIAL  ;  Surgeon: Ashok Pall, MD;  Location: [place];  Service: Neurosurgery;  Laterality: N/A;   INSERTION LUMBAR SPINAL CORD STIMULATOR TRIAL     UPPER GI ENDOSCOPY      Family History  Problem Relation Age of Onset   Heart disease Mother    Diabetes Mother    Hypertension Mother    Stroke Mother    Heart disease Father    Heart disease Sister    Diabetes Sister    Hyperlipidemia Sister    Hypertension Sister    Stroke Sister    Alcohol abuse Brother    Hyperlipidemia Brother     Social History   Socioeconomic History   Marital status: Divorced    Spouse name: Not on file   Number of children: 1   Years of education: 9th grade   Highest education level: 9th grade  Occupational  History   Occupation: Disabled.     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   Right-handed.   No daily caffeine use.   Social Determinants of Health   Financial Resource Strain: Not on file  Food Insecurity: Not on file  Transportation Needs: Not on file  Physical Activity: Not on file  Stress: Not on file  Social Connections: Not on file  Intimate Partner Violence: Not on file    Outpatient Medications Prior to Visit   Medication Sig Dispense Refill   ACCU-CHEK AVIVA PLUS test strip FOR TESTING BLOOD SUGARS 3 TIMES DAILY. DX:E11.9 300 strip 12   Accu-Chek Softclix Lancets lancets Dx:E11.9 100 each PRN   aspirin EC 81 MG tablet Take 1 tablet (81 mg total) by mouth daily.     atorvastatin (LIPITOR) 40 MG tablet Take 40 mg by mouth daily.     B-D ULTRAFINE III SHORT PEN 31G X 8 MM MISC USE AS DIRECTED 100 each 6   Blood Glucose Monitoring Suppl (ACCU-CHEK AVIVA PLUS) w/Device KIT Check blood sugars 3 times daily DX:E11.9 1 kit prn   cholecalciferol (VITAMIN D) 1000 units tablet Take 1,000 Units by mouth daily.     dexlansoprazole (DEXILANT) 60 MG capsule Take 60 mg by mouth daily.     dicyclomine (BENTYL) 20 MG tablet Take 20 mg by mouth every 6 (six) hours as needed for spasms (cramps/stomach pain).     doxepin (SINEQUAN) 10 MG capsule Take 10 mg by mouth at bedtime.     empagliflozin (JARDIANCE) 25 MG TABS tablet TAKE 25 MG BY MOUTH DAILY. 90 tablet 1   famotidine (PEPCID) 20 MG tablet PLEASE SEE ATTACHED FOR DETAILED DIRECTIONS     fluticasone (FLONASE) 50 MCG/ACT nasal spray PLACE 1 SPRAY INTO BOTH NOSTRILS DAILY. 48 mL 4   insulin glargine (LANTUS SOLOSTAR) 100 UNIT/ML Solostar Pen Inject 10 Units into the skin daily.     Krill Oil 500 MG CAPS Take 500 mg by mouth daily.     levocetirizine (XYZAL) 5 MG tablet Take 1 tablet (5 mg total) by mouth every evening. 90 tablet 3   Multiple Vitamin (MULTIVITAMIN WITH MINERALS) TABS tablet Take 1 tablet by mouth daily.     OVER THE COUNTER MEDICATION Take 1 tablet by mouth 3 (three) times daily. Golo otc weight loss supplement     perphenazine (TRILAFON) 8 MG tablet Take 8 mg by mouth at bedtime.  2   pregabalin (LYRICA) 200 MG capsule TAKE 1 CAPSULE (200 MG TOTAL) BY MOUTH 3 (THREE) TIMES DAILY. 90 capsule 1   PNEUMOVAX 23 25 MCG/0.5ML injection      pravastatin (PRAVACHOL) 40 MG tablet Take 1 tablet (40 mg total) by mouth at bedtime. 90 tablet 3   tiZANidine  (ZANAFLEX) 4 MG tablet Take 1 tablet (4 mg total) by mouth every 6 (six) hours as needed for muscle spasms. 60 tablet 0   No facility-administered medications prior to visit.    Allergies  Allergen  Reactions   Penicillins Hives and Other (See Comments)    PATIENT HAS HAD A PCN REACTION WITH IMMEDIATE RASH, FACIAL/TONGUE/THROAT SWELLING, SOB, OR LIGHTHEADEDNESS WITH HYPOTENSION:  #  #  YES  #  #  Has patient had a PCN reaction causing severe rash involving mucus membranes or skin necrosis: No Has patient had a PCN reaction that required hospitalization: No Has patient had a PCN reaction occurring within the last 10 years: No If all of the above answers are "NO", then may proceed with Cephalosporin use.    Duloxetine Other (See Comments)    GI upset   Tramadol Other (See Comments)    Nervousness, causes hands to shake   Aspirin Nausea Only   Codeine Itching   Hydrocodone Nausea Only and Other (See Comments)    Nose bleed   Metformin And Related Diarrhea and Nausea Only   Oxycodone Nausea And Vomiting    GI Upset (intolerance)   Victoza [Liraglutide] Other (See Comments)    Abdominal pain    Review of Systems     Objective:    Physical Exam Constitutional:      Appearance: She is well-developed.  HENT:     Head: Normocephalic and atraumatic.  Cardiovascular:     Rate and Rhythm: Normal rate and regular rhythm.     Heart sounds: Normal heart sounds.  Pulmonary:     Effort: Pulmonary effort is normal.     Breath sounds: Normal breath sounds.  Musculoskeletal:     Comments: 1+ pitting edema to the upper one third of the lower leg.  It extends down to the top of the foot.  Trace edema around the right ankle.  Dorsal pedal pulses 2+ bilaterally and posterior tibial pulses 2+ bilaterally.  No rash or discoloration.  Skin:    General: Skin is warm and dry.  Neurological:     Mental Status: She is alert and oriented to person, place, and time.  Psychiatric:        Behavior:  Behavior normal.    BP 111/68   Pulse 89   Ht _0  (1.626 m)   Wt 222 lb (100.7 kg)   LMP  (LMP Unknown)   SpO2 98%   BMI 38.11 kg/m  Wt Readings from Last 3 Encounters:  12/25/20 222 lb (100.7 kg)  11/18/20 215 lb (97.5 kg)  10/11/20 220 lb 7.4 oz (100 kg)    Health Maintenance Due  Topic Date Due   COVID-19 Vaccine (4 - Booster) 06/23/2020   FOOT EXAM  08/16/2020   URINE MICROALBUMIN  02/10/2021    There are no preventive care reminders to display for this patient.   Lab Results  Component Value Date   TSH 1.650 11/28/2019   Lab Results  Component Value Date   WBC 10.2 11/18/2020   HGB 11.1 (L) 11/18/2020   HCT 38.5 11/18/2020   MCV 71.7 (L) 11/18/2020   PLT 429 (H) 11/18/2020   Lab Results  Component Value Date   NA 142 11/18/2020   K 4.7 11/18/2020   CO2 28 11/18/2020   GLUCOSE 74 11/18/2020   BUN 6 (L) 11/18/2020   CREATININE 0.76 11/18/2020   BILITOT 0.1 (L) 10/15/2020   ALKPHOS 52 10/15/2020   AST 24 10/15/2020   ALT 22 10/15/2020   PROT 5.2 (L) 10/15/2020   ALBUMIN 2.4 (L) 10/15/2020   CALCIUM 8.9 11/18/2020   ANIONGAP 6 10/16/2020   EGFR 85 11/18/2020   Lab Results  Component Value  Date   CHOL 127 10/13/2020   Lab Results  Component Value Date   HDL 32 (L) 10/13/2020   Lab Results  Component Value Date   LDLCALC 75 10/13/2020   Lab Results  Component Value Date   TRIG 99 10/13/2020   Lab Results  Component Value Date   CHOLHDL 4.0 10/13/2020   Lab Results  Component Value Date   HGBA1C 6.8 (H) 10/13/2020       Assessment & Plan:   Problem List Items Addressed This Visit       Cardiovascular and Mediastinum   Essential hypertension   Relevant Medications   atorvastatin (LIPITOR) 40 MG tablet   hydrochlorothiazide (HYDRODIURIL) 25 MG tablet   Other Relevant Orders   CBC   COMPLETE METABOLIC PANEL WITH GFR   TSH   Urine Microalbumin w/creat. ratio   B Nat Peptide     Endocrine   Type 2 diabetes mellitus with  other specified complication ([facility])   Relevant Medications   atorvastatin (LIPITOR) 40 MG tablet   Other Relevant Orders   CBC   COMPLETE METABOLIC PANEL WITH GFR   TSH   Urine Microalbumin w/creat. ratio   B Nat Peptide   Other Visit Diagnoses     Need for immunization against influenza    -  Primary   Relevant Orders   Flu Vaccine QUAD High Dose(Fluad) (Completed)   Bilateral lower extremity edema       Relevant Medications   hydrochlorothiazide (HYDRODIURIL) 25 MG tablet   Other Relevant Orders   CBC   COMPLETE METABOLIC PANEL WITH GFR   TSH   Urine Microalbumin w/creat. ratio   B Nat Peptide      Bilateral lower extremity edema-unclear etiology will evaluate for changes in renal function, liver function, thyroid function, anemia etc.  She is in normal sinus rhythm today.  No recent changes to medication except had started some Tylenol but she had come off of that about a week ago without improvement in her symptoms.  Did give her HCTZ to take as needed.  We will need to monitor potassium and blood pressure.  Meds ordered this encounter  Medications   hydrochlorothiazide (HYDRODIURIL) 25 MG tablet    Sig: Take 1 tablet (25 mg total) by mouth daily as needed. For swelling    Dispense:  30 tablet    Refill:  0      Beatrice Lecher, MD

## 2020-12-26 ENCOUNTER — Ambulatory Visit: Payer: Medicare Other | Admitting: Podiatry

## 2020-12-26 LAB — MICROALBUMIN / CREATININE URINE RATIO
Creatinine, Urine: 45 mg/dL (ref 20–275)
Microalb Creat Ratio: 7 mcg/mg creat (ref <30)
Microalb, Ur: 0.3 mg/dL

## 2020-12-26 NOTE — Progress Notes (Signed)
Call patient: No excess protein in the urine which is good.  She is still mildly anemic at 11.3 which is similar to about a month ago.  And kidney function are normal.  Thyroid looks good.  1 test is still pending.  Please call the lab and see if we can add an iron and ferritin level.  Can use diagnosis of microcytic anemia.

## 2020-12-30 ENCOUNTER — Other Ambulatory Visit: Payer: Self-pay | Admitting: Family Medicine

## 2020-12-30 ENCOUNTER — Telehealth: Payer: Self-pay | Admitting: *Deleted

## 2020-12-30 DIAGNOSIS — E119 Type 2 diabetes mellitus without complications: Secondary | ICD-10-CM

## 2020-12-30 NOTE — Progress Notes (Signed)
Call patient: The BNP test is normal so no sign of heart failure which is very reassuring.  Let us know how the swelling is doing.

## 2020-12-30 NOTE — Telephone Encounter (Signed)
Patient is calling to confirm her appointment 01/02/21'@9'$ :15.

## 2020-12-31 ENCOUNTER — Other Ambulatory Visit: Payer: Self-pay | Admitting: *Deleted

## 2020-12-31 DIAGNOSIS — E1169 Type 2 diabetes mellitus with other specified complication: Secondary | ICD-10-CM

## 2020-12-31 LAB — CBC
HCT: 39.7 % (ref 35.0–45.0)
Hemoglobin: 11.3 g/dL — ABNORMAL LOW (ref 11.7–15.5)
MCH: 20.3 pg — ABNORMAL LOW (ref 27.0–33.0)
MCHC: 28.5 g/dL — ABNORMAL LOW (ref 32.0–36.0)
MCV: 71.3 fL — ABNORMAL LOW (ref 80.0–100.0)
MPV: 12.3 fL (ref 7.5–12.5)
Platelets: 350 10*3/uL (ref 140–400)
RBC: 5.57 10*6/uL — ABNORMAL HIGH (ref 3.80–5.10)
RDW: 17.7 % — ABNORMAL HIGH (ref 11.0–15.0)
WBC: 12.9 10*3/uL — ABNORMAL HIGH (ref 3.8–10.8)

## 2020-12-31 LAB — COMPLETE METABOLIC PANEL WITH GFR
AG Ratio: 1.3 (calc) (ref 1.0–2.5)
ALT: 16 U/L (ref 6–29)
AST: 18 U/L (ref 10–35)
Albumin: 4.1 g/dL (ref 3.6–5.1)
Alkaline phosphatase (APISO): 101 U/L (ref 37–153)
BUN: 11 mg/dL (ref 7–25)
CO2: 28 mmol/L (ref 20–32)
Calcium: 9.1 mg/dL (ref 8.6–10.4)
Chloride: 105 mmol/L (ref 98–110)
Creat: 0.73 mg/dL (ref 0.50–1.05)
Globulin: 3.2 g/dL (calc) (ref 1.9–3.7)
Glucose, Bld: 133 mg/dL — ABNORMAL HIGH (ref 65–99)
Potassium: 4.1 mmol/L (ref 3.5–5.3)
Sodium: 142 mmol/L (ref 135–146)
Total Bilirubin: 0.3 mg/dL (ref 0.2–1.2)
Total Protein: 7.3 g/dL (ref 6.1–8.1)
eGFR: 90 mL/min/{1.73_m2} (ref >=60)

## 2020-12-31 LAB — BRAIN NATRIURETIC PEPTIDE: Brain Natriuretic Peptide: 9 pg/mL (ref <100)

## 2020-12-31 LAB — IRON: Iron: 25 ug/dL — ABNORMAL LOW (ref 45–160)

## 2020-12-31 LAB — FERRITIN: Ferritin: 19 ng/mL (ref 16–288)

## 2020-12-31 LAB — TSH: TSH: 1.73 mIU/L (ref 0.40–4.50)

## 2020-12-31 MED ORDER — LANTUS SOLOSTAR 100 UNIT/ML ~~LOC~~ SOPN: 36.0000 [IU] | PEN_INJECTOR | Freq: Every evening | SUBCUTANEOUS | 6 refills | Status: DC | PRN

## 2020-12-31 NOTE — Progress Notes (Signed)
Hi Amanda Morris, the extra iron test shows that you are iron deficient.  I would recommend starting an over-the-counter iron supplement.  This should help also increase her hemoglobin and help with her energy levels.  There are couple different options over-the-counter.  There is one called Slow Fe either which is good.  If you have difficulty with over-the-counter products then please let me know.  Some people do have to take it with a stool softener because it can be constipating.

## 2021-01-02 ENCOUNTER — Encounter: Payer: Self-pay | Admitting: Podiatry

## 2021-01-02 ENCOUNTER — Ambulatory Visit (INDEPENDENT_AMBULATORY_CARE_PROVIDER_SITE_OTHER): Payer: Medicare Other | Admitting: Podiatry

## 2021-01-02 ENCOUNTER — Other Ambulatory Visit: Payer: Self-pay

## 2021-01-02 DIAGNOSIS — M79675 Pain in left toe(s): Secondary | ICD-10-CM | POA: Diagnosis not present

## 2021-01-02 DIAGNOSIS — E1169 Type 2 diabetes mellitus with other specified complication: Secondary | ICD-10-CM

## 2021-01-02 DIAGNOSIS — M79604 Pain in right leg: Secondary | ICD-10-CM

## 2021-01-02 DIAGNOSIS — M79674 Pain in right toe(s): Secondary | ICD-10-CM | POA: Diagnosis not present

## 2021-01-02 DIAGNOSIS — B351 Tinea unguium: Secondary | ICD-10-CM

## 2021-01-02 DIAGNOSIS — M79605 Pain in left leg: Secondary | ICD-10-CM

## 2021-01-02 DIAGNOSIS — L84 Corns and callosities: Secondary | ICD-10-CM
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
## 2021-01-05 DIAGNOSIS — M6282 Rhabdomyolysis: Secondary | ICD-10-CM | POA: Diagnosis not present

## 2021-01-12 ENCOUNTER — Other Ambulatory Visit: Payer: Self-pay | Admitting: Family Medicine

## 2021-01-12 DIAGNOSIS — R600 Localized edema: Secondary | ICD-10-CM

## 2021-01-19 LAB — HM HEPATITIS C SCREENING LAB: HM Hepatitis Screen: NEGATIVE

## 2021-01-22 ENCOUNTER — Encounter: Payer: Self-pay | Admitting: Family Medicine

## 2021-01-22 ENCOUNTER — Ambulatory Visit (INDEPENDENT_AMBULATORY_CARE_PROVIDER_SITE_OTHER): Payer: Medicare Other | Admitting: Family Medicine

## 2021-01-22 ENCOUNTER — Other Ambulatory Visit: Payer: Self-pay

## 2021-01-22 VITALS — BP 122/72 | HR 87 | Ht 64.0 in | Wt 222.0 lb

## 2021-01-22 DIAGNOSIS — R600 Localized edema: Secondary | ICD-10-CM

## 2021-01-22 DIAGNOSIS — E1169 Type 2 diabetes mellitus with other specified complication: Secondary | ICD-10-CM | POA: Diagnosis not present

## 2021-01-22 DIAGNOSIS — Z794 Long term (current) use of insulin: Secondary | ICD-10-CM

## 2021-01-22 DIAGNOSIS — I10 Essential (primary) hypertension: Secondary | ICD-10-CM | POA: Diagnosis not present

## 2021-01-22 LAB — POCT GLYCOSYLATED HEMOGLOBIN (HGB A1C): Hemoglobin A1C: 6.2 % — AB (ref 4.0–5.6)

## 2021-01-22 NOTE — Progress Notes (Signed)
Established Patient Office Visit  Subjective:  Patient ID: Amanda Morris, female    DOB: 03/18/1951  Age: 70 y.o. MRN: [id number]  CC:  Chief Complaint  Patient presents with   Follow-up    HPI [name] presents for   Diabetes -+ low blood sugars. No wounds or sores that are not healing well. No increased thirst or urination. Checking glucose at home. Taking medications as prescribed without any side effects. She brought in her bag of meds today and she is taking glipizide 10 mg twice a day.  This was actually discontinued when she was in the hospital in June.  She did have a home visit from a nurse from her insurance company for her Medicare.  They did a fingerstick A1c at that point in time and it was 6.6 which is great.  She is also here today to follow-up on her lower extremity swelling which she says is better especially in her right ankle but her left ankle still swelling.  She denies any recent shortness of breath.  She is taking the HCTZ regularly.    Past Medical History:  Diagnosis Date   Allergy    Diabetes mellitus without complication ([facility])    type 2   GERD (gastroesophageal reflux disease)    Headache    History of hiatal hernia    Hypercholesterolemia    Numbness    left, outer thigh   Palpitations    in the past, no current issues per patient   Postlaminectomy syndrome     Past Surgical History:  Procedure Laterality Date   ABDOMINAL HYSTERECTOMY  1982   BACK SURGERY     x 2    BREAST REDUCTION SURGERY Bilateral 03/17/2016   Procedure: MAMMARY REDUCTION  (BREAST);  Surgeon: Wallace Going, DO;  Location: [facility];  Service: Plastics;  Laterality: Bilateral;   BREAST SURGERY     COLONOSCOPY     LUMBAR WOUND DEBRIDEMENT N/A 05/22/2020   Procedure: Incision and drainage of lumbar wound;  Surgeon: Ashok Pall, MD;  Location: [facility];  Service: Neurosurgery;  Laterality: N/A;   SHOULDER SURGERY  06/2008, 09/2010    Right, by Dr. Karie Soda, then Dr. Judeth Horn   SPINAL CORD STIMULATOR INSERTION N/A 12/02/2017   Procedure: LUMBAR SPINAL CORD STIMULATOR INSERTION;  Surgeon: Ashok Pall, MD;  Location: [facility];  Service: Neurosurgery;  Laterality: N/A;   SPINAL CORD STIMULATOR TRIAL N/A 11/25/2017   Procedure: INSERTION LUMBAR SPINAL CORD STIMULATOR TRIAL  ;  Surgeon: Ashok Pall, MD;  Location: [facility];  Service: Neurosurgery;  Laterality: N/A;   INSERTION LUMBAR SPINAL CORD STIMULATOR TRIAL     UPPER GI ENDOSCOPY      Family History  Problem Relation Age of Onset   Heart disease Mother    Diabetes Mother    Hypertension Mother    Stroke Mother    Heart disease Father    Heart disease Sister    Diabetes Sister    Hyperlipidemia Sister    Hypertension Sister    Stroke Sister    Alcohol abuse Brother    Hyperlipidemia Brother     Social History   Socioeconomic History   Marital status: Divorced    Spouse name: Not on file   Number of children: 1   Years of education: 9th grade   Highest education level: 9th grade  Occupational History   Occupation: Disabled.     Comment: retiredAeronautical engineer at [employer]  Smoking status: Former    Types: Cigarettes    Quit date: 07/30/2007    Years since quitting: 13.4   Smokeless tobacco: Never  Vaping Use   Vaping Use: Never used  Substance and Sexual Activity   Alcohol use: No   Drug use: No   Sexual activity: Not Currently    Birth control/protection: Surgical    Comment: Hysterectomy  Other Topics Concern   Not on file  Social History Narrative   Lives alone.   Right-handed.   No daily caffeine use.   Social Determinants of Health   Financial Resource Strain: Not on file  Food Insecurity: Not on file  Transportation Needs: Not on file  Physical Activity: Not on file  Stress: Not on file  Social Connections: Not on file  Intimate Partner Violence: Not on file    Outpatient Medications Prior to Visit  Medication Sig  Dispense Refill   ACCU-CHEK AVIVA PLUS test strip FOR TESTING BLOOD SUGARS 3 TIMES DAILY. DX:E11.9 300 strip 12   Accu-Chek Softclix Lancets lancets DX:E11.9 USE AS DIRECTED 100 each PRN   aspirin EC 81 MG tablet Take 1 tablet (81 mg total) by mouth daily.     atorvastatin (LIPITOR) 40 MG tablet Take 40 mg by mouth daily.     B-D ULTRAFINE III SHORT PEN 31G X 8 MM MISC USE AS DIRECTED 100 each 6   Blood Glucose Monitoring Suppl (ACCU-CHEK AVIVA PLUS) w/Device KIT Check blood sugars 3 times daily DX:E11.9 1 kit prn   cholecalciferol (VITAMIN D) 1000 units tablet Take 1,000 Units by mouth daily.     dexlansoprazole (DEXILANT) 60 MG capsule Take 60 mg by mouth daily.     dicyclomine (BENTYL) 20 MG tablet Take 20 mg by mouth every 6 (six) hours as needed for spasms (cramps/stomach pain).     doxepin (SINEQUAN) 10 MG capsule Take 10 mg by mouth at bedtime.     empagliflozin (JARDIANCE) 25 MG TABS tablet TAKE 25 MG BY MOUTH DAILY. 90 tablet 1   famotidine (PEPCID) 20 MG tablet PLEASE SEE ATTACHED FOR DETAILED DIRECTIONS     Ferrous Sulfate ([facility] SLOW RELEASE IRON PO) Take 1 tablet by mouth daily.     fluticasone (FLONASE) 50 MCG/ACT nasal spray PLACE 1 SPRAY INTO BOTH NOSTRILS DAILY. 48 mL 4   hydrochlorothiazide (HYDRODIURIL) 25 MG tablet TAKE 1 TABLET (25 MG TOTAL) BY MOUTH DAILY AS NEEDED. FOR SWELLING 30 tablet 0   insulin glargine (LANTUS SOLOSTAR) 100 UNIT/ML Solostar Pen Inject 36 Units into the skin at bedtime as needed (high blood sugar). 15 mL 6   Krill Oil 500 MG CAPS Take 500 mg by mouth daily.     levocetirizine (XYZAL) 5 MG tablet Take 1 tablet (5 mg total) by mouth every evening. 90 tablet 3   Multiple Vitamin (MULTIVITAMIN WITH MINERALS) TABS tablet Take 1 tablet by mouth daily.     OVER THE COUNTER MEDICATION Take 1 tablet by mouth 3 (three) times daily. Golo otc weight loss supplement     perphenazine (TRILAFON) 8 MG tablet Take 8 mg by mouth at bedtime.  2   pregabalin (LYRICA) 200  MG capsule TAKE 1 CAPSULE (200 MG TOTAL) BY MOUTH 3 (THREE) TIMES DAILY. 90 capsule 1   insulin glargine (LANTUS SOLOSTAR) 100 UNIT/ML Solostar Pen Inject 10 Units into the skin daily.     No facility-administered medications prior to visit.    Allergies  Allergen Reactions   Penicillins Hives and Other (  See Comments)    PATIENT HAS HAD A PCN REACTION WITH IMMEDIATE RASH, FACIAL/TONGUE/THROAT SWELLING, SOB, OR LIGHTHEADEDNESS WITH HYPOTENSION:  #  #  YES  #  #  Has patient had a PCN reaction causing severe rash involving mucus membranes or skin necrosis: No Has patient had a PCN reaction that required hospitalization: No Has patient had a PCN reaction occurring within the last 10 years: No If all of the above answers are "NO", then may proceed with Cephalosporin use.    Duloxetine Other (See Comments)    GI upset   Tramadol Other (See Comments)    Nervousness, causes hands to shake   Aspirin Nausea Only   Codeine Itching   Hydrocodone Nausea Only and Other (See Comments)    Nose bleed   Metformin And Related Diarrhea and Nausea Only   Oxycodone Nausea And Vomiting    GI Upset (intolerance)   Victoza [Liraglutide] Other (See Comments)    Abdominal pain    ROS Review of Systems    Objective:    Physical Exam Constitutional:      Appearance: Normal appearance. She is well-developed.  HENT:     Head: Normocephalic and atraumatic.  Cardiovascular:     Rate and Rhythm: Normal rate and regular rhythm.     Heart sounds: Normal heart sounds.  Pulmonary:     Effort: Pulmonary effort is normal.     Breath sounds: Normal breath sounds.  Skin:    General: Skin is warm and dry.  Neurological:     Mental Status: She is alert and oriented to person, place, and time.  Psychiatric:        Behavior: Behavior normal.    BP 122/72   Pulse 87   Ht 5' 4"  (1.626 m)   Wt 222 lb (100.7 kg)   LMP  (LMP Unknown)   SpO2 98%   BMI 38.11 kg/m  Wt Readings from Last 3 Encounters:   01/22/21 222 lb (100.7 kg)  12/25/20 222 lb (100.7 kg)  11/18/20 215 lb (97.5 kg)     Health Maintenance Due  Topic Date Due   COVID-19 Vaccine (4 - Booster) 06/17/2020    There are no preventive care reminders to display for this patient.  Lab Results  Component Value Date   TSH 1.73 12/25/2020   Lab Results  Component Value Date   WBC 12.9 (H) 12/25/2020   HGB 11.3 (L) 12/25/2020   HCT 39.7 12/25/2020   MCV 71.3 (L) 12/25/2020   PLT 350 12/25/2020   Lab Results  Component Value Date   NA 142 12/25/2020   K 4.1 12/25/2020   CO2 28 12/25/2020   GLUCOSE 133 (H) 12/25/2020   BUN 11 12/25/2020   CREATININE 0.73 12/25/2020   BILITOT 0.3 12/25/2020   ALKPHOS 52 10/15/2020   AST 18 12/25/2020   ALT 16 12/25/2020   PROT 7.3 12/25/2020   ALBUMIN 2.4 (L) 10/15/2020   CALCIUM 9.1 12/25/2020   ANIONGAP 6 10/16/2020   EGFR 90 12/25/2020   Lab Results  Component Value Date   CHOL 127 10/13/2020   Lab Results  Component Value Date   HDL 32 (L) 10/13/2020   Lab Results  Component Value Date   LDLCALC 75 10/13/2020   Lab Results  Component Value Date   TRIG 99 10/13/2020   Lab Results  Component Value Date   CHOLHDL 4.0 10/13/2020   Lab Results  Component Value Date   HGBA1C 6.2 (A) 01/22/2021  Assessment & Plan:   Problem List Items Addressed This Visit       Cardiovascular and Mediastinum   Essential hypertension    Well controlled. Continue current regimen. Follow up in  6 mo         Endocrine   Type 2 diabetes mellitus with other specified complication ([facility])    a1 C looks fantastic today.  A1c of 6.6 when the home nurse came out from the insurance company to do her Medicare exam.  A1c is 6.2 today.  When she brought in her medication she actually had glipizide in her bag which we did not have on her medication list it was stopped during her hospitalization but she has been taking it she has been having some occasional lows.  She feels like  it really helps her though and did not want to discontinue it so we discussed at least decreasing her dose down to 5 mg twice a day to see if that helps improve the low blood sugars.  She says she has enough to try that for about a month.      Relevant Orders   POCT glycosylated hemoglobin (Hb A1C) (Completed)     Other   Bilateral lower extremity edema - Primary    He has had some mild improvement in extremity edema.  We did discuss getting some compression stockings probably 20-25 pressure.  I think would work well for her the left ankle is the one that is still swollen but she is also had an injury to that ankle and I think that is why tends to swell more.       No orders of the defined types were placed in this encounter.   Follow-up: Return in about 3 months (around 04/23/2021) for Diabetes follow-up.    Beatrice Lecher, MD

## 2021-01-22 NOTE — Assessment & Plan Note (Signed)
a1 C looks fantastic today.  A1c of 6.6 when the home nurse came out from the insurance company to do her Medicare exam.  A1c is 6.2 today.  When she brought in her medication she actually had glipizide in her bag which we did not have on her medication list it was stopped during her hospitalization but she has been taking it she has been having some occasional lows.  She feels like it really helps her though and did not want to discontinue it so we discussed at least decreasing her dose down to 5 mg twice a day to see if that helps improve the low blood sugars.  She says she has enough to try that for about a month.

## 2021-01-22 NOTE — Assessment & Plan Note (Signed)
He has had some mild improvement in extremity edema.  We did discuss getting some compression stockings probably 20-25 pressure.  I think would work well for her the left ankle is the one that is still swollen but she is also had an injury to that ankle and I think that is why tends to swell more.

## 2021-01-22 NOTE — Patient Instructions (Signed)
Please cut your glipizide in half and take half a tab twice a day.

## 2021-01-22 NOTE — Assessment & Plan Note (Signed)
Well controlled. Continue current regimen. Follow up in  6 mo  

## 2021-01-22 NOTE — Progress Notes (Signed)
Pt started taking the OTC iron she is doing well with this. Not had any issues with constipation.  She reports that she is still taking the Glipizide 10 mg BID. This medication is not on her list however she has this in her medication bag and the last refill was 07/19/2020. She is also on Jardiance 25 mg QD, and taking Lantus 26-36 Units.

## 2021-01-26 ENCOUNTER — Ambulatory Visit (INDEPENDENT_AMBULATORY_CARE_PROVIDER_SITE_OTHER): Payer: Medicare Other | Admitting: Family Medicine

## 2021-01-26 ENCOUNTER — Other Ambulatory Visit: Payer: Self-pay

## 2021-01-26 DIAGNOSIS — K589 Irritable bowel syndrome without diarrhea: Secondary | ICD-10-CM | POA: Diagnosis not present

## 2021-01-26 DIAGNOSIS — R109 Unspecified abdominal pain: Secondary | ICD-10-CM | POA: Diagnosis not present

## 2021-01-26 DIAGNOSIS — Z86010 Personal history of colonic polyps: Secondary | ICD-10-CM | POA: Diagnosis not present

## 2021-01-26 DIAGNOSIS — R194 Change in bowel habit: Secondary | ICD-10-CM | POA: Diagnosis not present

## 2021-01-26 DIAGNOSIS — R1013 Epigastric pain: Secondary | ICD-10-CM | POA: Diagnosis not present

## 2021-01-26 DIAGNOSIS — Z0000 Encounter for general adult medical examination without abnormal findings: Secondary | ICD-10-CM

## 2021-01-26 DIAGNOSIS — D509 Iron deficiency anemia, unspecified: Secondary | ICD-10-CM | POA: Diagnosis not present

## 2021-01-26 DIAGNOSIS — R110 Nausea: Secondary | ICD-10-CM | POA: Diagnosis not present

## 2021-01-26 NOTE — Progress Notes (Signed)
**Note Amanda-Identified via Obfuscation** MEDICARE ANNUAL WELLNESS VISIT  01/26/2021  Telephone Visit Disclaimer This Medicare AWV was conducted by telephone due to national recommendations for restrictions regarding the COVID-19 Pandemic (e.g. social distancing).  I verified, using two identifiers, that I am speaking with Amanda Morris or their authorized healthcare agent. I discussed the limitations, risks, security, and privacy concerns of performing an evaluation and management service by telephone and the potential availability of an in-person appointment in the future. The patient expressed understanding and agreed to proceed.  Location of Patient: Home Location of Provider (nurse): In the office.  Subjective:    Amanda Morris is a 70 y.o. female patient of Metheney, Rene Kocher, MD who had a Medicare Annual Wellness Visit today via telephone. Amanda Morris is Retired and lives alone. she had 1 child, who passed away this year. she reports that she is socially active and does interact with friends/family regularly. she is minimally physically active and enjoys reading the bible and spending time with her nieces and nephews.  Patient Care Team: Hali Marry, MD as PCP - General (Family Medicine) Eulis Canner, MD (Gastroenterology) Silverio Decamp, MD as Consulting Physician (Family Medicine)  Advanced Directives 01/26/2021 10/12/2020 10/10/2020 10/07/2020 08/29/2020 05/22/2020 04/14/2020  Does Patient Have a Medical Advance Directive? No No No No Yes No Yes  Type of Advance Directive - - - - - - Therapist, sports in [place]  Would patient like information on creating a medical advance directive? No - Patient declined No - Patient declined No - Patient declined Yes (MAU/Ambulatory/Procedural Areas - Information given) - No - Patient declined -    Hospital Utilization Over the Past 12 Months: # of hospitalizations or ER visits: 1 # of  surgeries: 1  Review of Systems    Patient reports that her overall health is better compared to last year.  History obtained from chart review and the patient  Patient Reported Readings (BP, Pulse, CBG, Weight, etc) none  Pain Assessment Pain : No/denies pain     Current Medications & Allergies (verified) Allergies as of 01/26/2021       Reactions   Penicillins Hives, Other (See Comments)   PATIENT HAS HAD A PCN REACTION WITH IMMEDIATE RASH, FACIAL/TONGUE/THROAT SWELLING, SOB, OR LIGHTHEADEDNESS WITH HYPOTENSION:  #  #  YES  #  #  Has patient had a PCN reaction causing severe rash involving mucus membranes or skin necrosis: No Has patient had a PCN reaction that required hospitalization: No Has patient had a PCN reaction occurring within the last 10 years: No If all of the above answers are "NO", then may proceed with Cephalosporin use.   Duloxetine Other (See Comments)   GI upset   Tramadol Other (See Comments)   Nervousness, causes hands to shake   Aspirin Nausea Only   Codeine Itching   Hydrocodone Nausea Only, Other (See Comments)   Nose bleed   Metformin And Related Diarrhea, Nausea Only   Oxycodone Nausea And Vomiting   GI Upset (intolerance)   Victoza [liraglutide] Other (See Comments)   Abdominal pain        Medication List        Accurate as of January 26, 2021  4:19 PM. If you have any questions, ask your nurse or doctor.          Accu-Chek Aviva Plus test strip Generic drug: glucose blood FOR TESTING BLOOD  SUGARS 3 TIMES DAILY. DX:E11.9   Accu-Chek Aviva Plus w/Device Kit Check blood sugars 3 times daily DX:E11.9   Accu-Chek Softclix Lancets lancets DX:E11.9 USE AS DIRECTED   aspirin EC 81 MG tablet Take 1 tablet (81 mg total) by mouth daily.   atorvastatin 40 MG tablet Commonly known as: LIPITOR Take 40 mg by mouth daily.   B-D ULTRAFINE III SHORT PEN 31G X 8 MM Misc Generic drug: Insulin Pen Needle USE AS DIRECTED   cholecalciferol  1000 units tablet Commonly known as: VITAMIN D Take 1,000 Units by mouth daily.   [employer]S SLOW RELEASE IRON PO Take 1 tablet by mouth daily.   dexlansoprazole 60 MG capsule Commonly known as: DEXILANT Take 60 mg by mouth daily.   dicyclomine 20 MG tablet Commonly known as: BENTYL Take 20 mg by mouth every 6 (six) hours as needed for spasms (cramps/stomach pain).   doxepin 10 MG capsule Commonly known as: SINEQUAN Take 10 mg by mouth at bedtime.   empagliflozin 25 MG Tabs tablet Commonly known as: Jardiance TAKE 25 MG BY MOUTH DAILY.   famotidine 20 MG tablet Commonly known as: PEPCID PLEASE SEE ATTACHED FOR DETAILED DIRECTIONS   fluticasone 50 MCG/ACT nasal spray Commonly known as: FLONASE PLACE 1 SPRAY INTO BOTH NOSTRILS DAILY.   hydrochlorothiazide 25 MG tablet Commonly known as: HYDRODIURIL TAKE 1 TABLET (25 MG TOTAL) BY MOUTH DAILY AS NEEDED. FOR SWELLING   Krill Oil 500 MG Caps Take 500 mg by mouth daily.   Lantus SoloStar 100 UNIT/ML Solostar Pen Generic drug: insulin glargine Inject 36 Units into the skin at bedtime as needed (high blood sugar).   levocetirizine 5 MG tablet Commonly known as: XYZAL Take 1 tablet (5 mg total) by mouth every evening.   multivitamin with minerals Tabs tablet Take 1 tablet by mouth daily.   OVER THE COUNTER MEDICATION Take 1 tablet by mouth 3 (three) times daily. Golo otc weight loss supplement   perphenazine 8 MG tablet Commonly known as: TRILAFON Take 8 mg by mouth at bedtime.   pregabalin 200 MG capsule Commonly known as: LYRICA TAKE 1 CAPSULE (200 MG TOTAL) BY MOUTH 3 (THREE) TIMES DAILY.        History (reviewed): Past Medical History:  Diagnosis Date   Allergy    Diabetes mellitus without complication ([place])    type 2   GERD (gastroesophageal reflux disease)    Headache    History of hiatal hernia    Hypercholesterolemia    Numbness    left, outer thigh   Palpitations    in the past, no current issues  per patient   Postlaminectomy syndrome    Past Surgical History:  Procedure Laterality Date   ABDOMINAL HYSTERECTOMY  1982   BACK SURGERY     x 2    BREAST REDUCTION SURGERY Bilateral 03/17/2016   Procedure: MAMMARY REDUCTION  (BREAST);  Surgeon: Wallace Going, DO;  Location: [facility];  Service: Plastics;  Laterality: Bilateral;   BREAST SURGERY     COLONOSCOPY     LUMBAR WOUND DEBRIDEMENT N/A 05/22/2020   Procedure: Incision and drainage of lumbar wound;  Surgeon: Ashok Pall, MD;  Location: [facility];  Service: Neurosurgery;  Laterality: N/A;   SHOULDER SURGERY  06/2008, 09/2010   Right, by Dr. Karie Soda, then Dr. Judeth Horn   SPINAL CORD STIMULATOR INSERTION N/A 12/02/2017   Procedure: LUMBAR SPINAL CORD STIMULATOR INSERTION;  Surgeon: Ashok Pall, MD;  Location: [facility];  Service: Neurosurgery;  Laterality: N/A;   SPINAL CORD STIMULATOR TRIAL N/A 11/25/2017   Procedure: INSERTION LUMBAR SPINAL CORD STIMULATOR TRIAL  ;  Surgeon: Ashok Pall, MD;  Location: [facility];  Service: Neurosurgery;  Laterality: N/A;   INSERTION LUMBAR SPINAL CORD STIMULATOR TRIAL     UPPER GI ENDOSCOPY     Family History  Problem Relation Age of Onset   Heart disease Mother    Diabetes Mother    Hypertension Mother    Stroke Mother    Heart disease Father    Heart disease Sister    Diabetes Sister    Hyperlipidemia Sister    Hypertension Sister    Stroke Sister    Alcohol abuse Brother    Hyperlipidemia Brother    Social History   Socioeconomic History   Marital status: Divorced    Spouse name: Not on file   Number of children: 1   Years of education: 10th grade   Highest education level: 10th grade  Occupational History   Occupation: Disabled.     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 She had one child, who passed away this year. She enjoys spending time with her nieces and nephews and she also enjoys reading there bible.   Social Determinants of Health   Financial Resource Strain: Low Risk    Difficulty of Paying Living Expenses: Not hard at all  Food Insecurity: No Food Insecurity   Worried About Charity fundraiser in the Last Year: Never true   [place] in the Last Year: Never true  Transportation Needs: No Transportation Needs   Lack of Transportation (Medical): No   Lack of Transportation (Non-Medical): No  Physical Activity: Inactive   Days of Exercise per Week: 0 days   Minutes of Exercise per Session: 0 min  Stress: No Stress Concern Present   Feeling of Stress : Not at all  Social Connections: Moderately Isolated   Frequency of Communication with Friends and Family: More than three times a week   Frequency of Social Gatherings with Friends and Family: Three times a week   Attends Religious Services: More than 4 times per year   Active Member of Clubs or Organizations: No   Attends Archivist Meetings: Never   Marital Status: Divorced    Activities of Daily Living In your present state of health, do you have any difficulty performing the following activities: 01/26/2021 10/12/2020  Hearing? N N  Vision? N N  Difficulty concentrating or making decisions? N N  Walking or climbing stairs? Y Y  Comment she is not able to climb stairs. -  Dressing or bathing? N Y  Doing errands, shopping? Y N  Comment Her niece helps with that. -  Preparing Food and eating ? N -  Using the Toilet? N -  In the past six months, have you accidently leaked urine? N -  Do you have problems with loss of bowel control? N  -  Managing your  Medications? N -  Managing your Finances? N -  Housekeeping or managing your Housekeeping? Y -  Comment She is not able to perform this right now because of recent surgery and fall. -  Some recent data might be hidden    Patient Education/ Literacy How often do you need to have someone help you when you read instructions, pamphlets, or other written materials from your doctor or pharmacy?: 1 - Never What is the last grade level you completed in school?: 10th grade  Exercise Current Exercise Habits: The patient does not participate in regular exercise at present, Exercise limited by: None identified  Diet Patient reports consuming 2 meals a day and 2-3 snack(s) a day Patient reports that her primary diet is: Regular Patient reports that she does have regular access to food.   Depression Screen PHQ 2/9 Scores 01/26/2021 01/22/2021 11/18/2020 09/01/2020 11/09/2019 08/06/2019 07/16/2019  PHQ - 2 Score 0 0 2 0 0 0 0  PHQ- 9 Score - - 2 - - - -     Fall Risk Fall Risk  01/26/2021 01/22/2021 11/18/2020 09/01/2020 07/16/2019  Falls in the past year? 1 0 1 0 1  Comment - - last fall in June 2022. No major injury - -  Number falls in past yr: 0 0 1 0 0  Injury with Fall? 1 0 0 0 0  Risk for fall due to : Orthopedic patient History of fall(s) - Impaired mobility;Orthopedic patient No Fall Risks  Risk for fall due to: Comment - - - - -  Follow up Falls evaluation completed;Education provided;Falls prevention discussed Falls prevention discussed;Falls evaluation completed - Falls prevention discussed;Falls evaluation completed Falls prevention discussed     Objective:  Verena Shawgo seemed alert and oriented and she participated appropriately during our telephone visit.  Blood Pressure Weight BMI  BP Readings from Last 3 Encounters:  01/22/21 122/72  12/25/20 111/68  11/18/20 102/65   Wt Readings from Last 3 Encounters:  01/22/21 222 lb (100.7 kg)  12/25/20 222 lb (100.7  kg)  11/18/20 215 lb (97.5 kg)   BMI Readings from Last 1 Encounters:  01/22/21 38.11 kg/m    *Unable to obtain current vital signs, weight, and BMI due to telephone visit type  Hearing/Vision  Nastacia did not seem to have difficulty with hearing/understanding during the telephone conversation Reports that she has not had a formal eye exam by an eye care professional within the past year Reports that she has not had a formal hearing evaluation within the past year *Unable to fully assess hearing and vision during telephone visit type  Cognitive Function: 6CIT Screen 01/26/2021 07/16/2019 07/11/2018 01/18/2017  What Year? 0 points 0 points 0 points 0 points  What month? 0 points 0 points 0 points 0 points  What time? 0 points 0 points 0 points 0 points  Count back from 20 0 points 0 points 0 points 2 points  Months in reverse 0 points 2 points 0 points 0 points  Repeat phrase 4 points 0 points 10 points 6 points  Total Score _0 (Normal:0-7, Significant for Dysfunction: >8)  Normal Cognitive Function Screening: Yes   Immunization & Health Maintenance Record Immunization History  Administered Date(s) Administered   Fluad Quad(high Dose 65+) 12/25/2020   Influenza Split 03/02/2011, 02/25/2012   Influenza, High Dose Seasonal PF 06/06/2017, 12/25/2017, 12/19/2018, 12/13/2019   Influenza,inj,Quad PF,6+ Mos 02/13/2015, 12/23/2015, 12/22/2016   Influenza-Unspecified 01/24/2013, 01/21/2014, 02/26/2015   Moderna Sars-Covid-2  Vaccination 06/08/2019, 07/06/2019   PFIZER(Purple Top)SARS-COV-2 Vaccination 03/25/2020   PPD Test 02/16/2017   Pneumococcal Conjugate-13 02/14/2015   Pneumococcal Polysaccharide-23 05/12/2005, 01/17/2018, 07/15/2020   Td 07/07/2019   Tdap 04/27/2007, 07/30/2017, 11/16/2019   Zoster Recombinat (Shingrix) 04/01/2017, 08/10/2017    Health Maintenance  Topic Date Due   COVID-19 Vaccine (4 - Booster) 06/17/2020   OPHTHALMOLOGY EXAM  09/01/2021 (Originally  11/02/2019)   HEMOGLOBIN A1C  07/22/2021   COLONOSCOPY (Pts 45-37yr Insurance coverage will need to be confirmed)  10/29/2021   URINE MICROALBUMIN  12/25/2021   MAMMOGRAM  01/03/2022   FOOT EXAM  01/22/2022   TETANUS/TDAP  11/15/2029   INFLUENZA VACCINE  Completed   DEXA SCAN  Completed   Hepatitis C Screening  Completed   Zoster Vaccines- Shingrix  Completed   HPV VACCINES  Aged Out       Assessment  This is a routine wellness examination for [employer]  Health Maintenance: Due or Overdue Health Maintenance Due  Topic Date Due   COVID-19 Vaccine (4 - Booster) 06/17/2020    RDe Burrsdoes not need a referral for Community Assistance: Care Management:   no Social Work:    no Prescription Assistance:  no Nutrition/Diabetes Education:  no   Plan:  Personalized Goals  Goals Addressed               This Visit's Progress     Patient Stated (pt-stated)        01/26/2021 AWV Goal: Exercise for General Health  Patient will verbalize understanding of the benefits of increased physical activity: Exercising regularly is important. It will improve your overall fitness, flexibility, and endurance. Regular exercise also will improve your overall health. It can help you control your weight, reduce stress, and improve your bone density. Over the next year, patient will increase physical activity as tolerated with a goal of at least 150 minutes of moderate physical activity per week.  You can tell that you are exercising at a moderate intensity if your heart starts beating faster and you start breathing faster but can still hold a conversation. Moderate-intensity exercise ideas include: Walking 1 mile (1.6 km) in about 15 minutes Biking Hiking Golfing Dancing Water aerobics Patient will verbalize understanding of everyday activities that increase physical activity by providing examples like the following: Yard work, such as: PArts development officerGardening Washing windows or floors Patient will be able to explain general safety guidelines for exercising:  Before you start a new exercise program, talk with your health care provider. Do not exercise so much that you hurt yourself, feel dizzy, or get very short of breath. Wear comfortable clothes and wear shoes with good support. Drink plenty of water while you exercise to prevent dehydration or heat stroke. Work out until your breathing and your heartbeat get faster.        Personalized Health Maintenance & Screening Recommendations  Screening mammography Eye exam  Lung Cancer Screening Recommended: no (Low Dose CT Chest recommended if Age 68-80years, 30 pack-year currently smoking OR have quit w/in past 15 years) Hepatitis C Screening recommended: no HIV Screening recommended: no  Advanced Directives: Written information was not prepared per patient's request.  Referrals & Orders No orders of the defined types were placed in this encounter.   Follow-up Plan Follow-up with MHali Marry MD as planned Mammogram referral has been sent and they will call  you to schedule. Schedule your eye exam. Medicare wellness visit in one year. AVS printed and mailed to the patient.   I have personally reviewed and noted the following in the patient's chart:   Medical and social history Use of alcohol, tobacco or illicit drugs  Current medications and supplements Functional ability and status Nutritional status Physical activity Advanced directives List of other physicians Hospitalizations, surgeries, and ER visits in previous 12 months Vitals Screenings to include cognitive, depression, and falls Referrals and appointments  In addition, I have reviewed and discussed with Amanda Morris certain preventive protocols, quality metrics, and best practice recommendations. A written personalized  care plan for preventive services as well as general preventive health recommendations is available and can be mailed to the patient at her request.      Tinnie Gens, RN  01/26/2021

## 2021-01-26 NOTE — Patient Instructions (Addendum)
[facility] Maintenance Summary and Written Plan of Care  Ms. Amanda Morris ,  Thank you for allowing me to perform your Medicare Annual Wellness Visit and for your ongoing commitment to your health.   Health Maintenance & Immunization History Health Maintenance  Topic Date Due  . COVID-19 Vaccine (4 - Booster) 02/11/2021 (Originally 06/17/2020)  . OPHTHALMOLOGY EXAM  09/01/2021 (Originally 11/02/2019)  . HEMOGLOBIN A1C  07/22/2021  . COLONOSCOPY (Pts 45-51yrs Insurance coverage will need to be confirmed)  10/29/2021  . URINE MICROALBUMIN  12/25/2021  . MAMMOGRAM  01/03/2022  . FOOT EXAM  01/22/2022  . TETANUS/TDAP  11/15/2029  . INFLUENZA VACCINE  Completed  . DEXA SCAN  Completed  . Hepatitis C Screening  Completed  . Zoster Vaccines- Shingrix  Completed  . HPV VACCINES  Aged Out   Immunization History  Administered Date(s) Administered  . Fluad Quad(high Dose 65+) 12/25/2020  . Influenza Split 03/02/2011, 02/25/2012  . Influenza, High Dose Seasonal PF 06/06/2017, 12/25/2017, 12/19/2018, 12/13/2019  . Influenza,inj,Quad PF,6+ Mos 02/13/2015, 12/23/2015, 12/22/2016  . Influenza-Unspecified 01/24/2013, 01/21/2014, 02/26/2015  . Moderna Sars-Covid-2 Vaccination 06/08/2019, 07/06/2019  . PFIZER(Purple Top)SARS-COV-2 Vaccination 03/25/2020  . PPD Test 02/16/2017  . Pneumococcal Conjugate-13 02/14/2015  . Pneumococcal Polysaccharide-23 05/12/2005, 01/17/2018, 07/15/2020  . Td 07/07/2019  . Tdap 04/27/2007, 07/30/2017, 11/16/2019  . Zoster Recombinat (Shingrix) 04/01/2017, 08/10/2017    These are the patient goals that we discussed:  Goals Addressed              This Visit's Progress   .  Patient Stated (pt-stated)        01/26/2021 AWV Goal: Exercise for General Health  Patient will verbalize understanding of the benefits of increased physical activity: Exercising regularly is important. It will improve your overall fitness, flexibility, and  endurance. Regular exercise also will improve your overall health. It can help you control your weight, reduce stress, and improve your bone density. Over the next year, patient will increase physical activity as tolerated with a goal of at least 150 minutes of moderate physical activity per week.  You can tell that you are exercising at a moderate intensity if your heart starts beating faster and you start breathing faster but can still hold a conversation. Moderate-intensity exercise ideas include: Walking 1 mile (1.6 km) in about 15 minutes Biking Hiking Golfing Dancing Water aerobics Patient will verbalize understanding of everyday activities that increase physical activity by providing examples like the following: Yard work, such as: Sales promotion account executive Gardening Washing windows or floors Patient will be able to explain general safety guidelines for exercising:  Before you start a new exercise program, talk with your health care provider. Do not exercise so much that you hurt yourself, feel dizzy, or get very short of breath. Wear comfortable clothes and wear shoes with good support. Drink plenty of water while you exercise to prevent dehydration or heat stroke. Work out until your breathing and your heartbeat get faster.         This is a list of Health Maintenance Items that are overdue or due now: Screening mammography Eye exam  Orders/Referrals Placed Today: No orders of the defined types were placed in this encounter.  (Contact our referral department at [phone] if you have not spoken with someone about your referral appointment within the next 5 days)    Follow-up Plan Follow-up with Hali Marry, MD as  planned Mammogram referral has been sent and they will call you to schedule. Schedule your eye exam. Medicare wellness visit in one year. AVS printed and mailed to the  patient.      Health Maintenance, Female Adopting a healthy lifestyle and getting preventive care are important in promoting health and wellness. Ask your health care provider about: The right schedule for you to have regular tests and exams. Things you can do on your own to prevent diseases and keep yourself healthy. What should I know about diet, weight, and exercise? Eat a healthy diet  Eat a diet that includes plenty of vegetables, fruits, low-fat dairy products, and lean protein. Do not eat a lot of foods that are high in solid fats, added sugars, or sodium. Maintain a healthy weight Body mass index (BMI) is used to identify weight problems. It estimates body fat based on height and weight. Your health care provider can help determine your BMI and help you achieve or maintain a healthy weight. Get regular exercise Get regular exercise. This is one of the most important things you can do for your health. Most adults should: Exercise for at least 150 minutes each week. The exercise should increase your heart rate and make you sweat (moderate-intensity exercise). Do strengthening exercises at least twice a week. This is in addition to the moderate-intensity exercise. Spend less time sitting. Even light physical activity can be beneficial. Watch cholesterol and blood lipids Have your blood tested for lipids and cholesterol at 68 years of age, then have this test every 5 years. Have your cholesterol levels checked more often if: Your lipid or cholesterol levels are high. You are older than 68 years of age. You are at high risk for heart disease. What should I know about cancer screening? Depending on your health history and family history, you may need to have cancer screening at various ages. This may include screening for: Breast cancer. Cervical cancer. Colorectal cancer. Skin cancer. Lung cancer. What should I know about heart disease, diabetes, and high blood pressure? Blood  pressure and heart disease High blood pressure causes heart disease and increases the risk of stroke. This is more likely to develop in people who have high blood pressure readings, are of African descent, or are overweight. Have your blood pressure checked: Every 3-5 years if you are 81-31 years of age. Every year if you are 21 years old or older. Diabetes Have regular diabetes screenings. This checks your fasting blood sugar level. Have the screening done: Once every three years after age 89 if you are at a normal weight and have a low risk for diabetes. More often and at a younger age if you are overweight or have a high risk for diabetes. What should I know about preventing infection? Hepatitis B If you have a higher risk for hepatitis B, you should be screened for this virus. Talk with your health care provider to find out if you are at risk for hepatitis B infection. Hepatitis C Testing is recommended for: Everyone born from [birth year] through 1963. Anyone with known risk factors for hepatitis C. Sexually transmitted infections (STIs) Get screened for STIs, including gonorrhea and chlamydia, if: You are sexually active and are younger than 68 years of age. You are older than 68 years of age and your health care provider tells you that you are at risk for this type of infection. Your sexual activity has changed since you were last screened, and you are at increased risk for  chlamydia or gonorrhea. Ask your health care provider if you are at risk. Ask your health care provider about whether you are at high risk for HIV. Your health care provider may recommend a prescription medicine to help prevent HIV infection. If you choose to take medicine to prevent HIV, you should first get tested for HIV. You should then be tested every 3 months for as long as you are taking the medicine. Pregnancy If you are about to stop having your period (premenopausal) and you may become pregnant, seek counseling  before you get pregnant. Take 400 to 800 micrograms (mcg) of folic acid every day if you become pregnant. Ask for birth control (contraception) if you want to prevent pregnancy. Osteoporosis and menopause Osteoporosis is a disease in which the bones lose minerals and strength with aging. This can result in bone fractures. If you are 29 years old or older, or if you are at risk for osteoporosis and fractures, ask your health care provider if you should: Be screened for bone loss. Take a calcium or vitamin D supplement to lower your risk of fractures. Be given hormone replacement therapy (HRT) to treat symptoms of menopause. Follow these instructions at home: Lifestyle Do not use any products that contain nicotine or tobacco, such as cigarettes, e-cigarettes, and chewing tobacco. If you need help quitting, ask your health care provider. Do not use street drugs. Do not share needles. Ask your health care provider for help if you need support or information about quitting drugs. Alcohol use Do not drink alcohol if: Your health care provider tells you not to drink. You are pregnant, may be pregnant, or are planning to become pregnant. If you drink alcohol: Limit how much you use to 0-1 drink a day. Limit intake if you are breastfeeding. Be aware of how much alcohol is in your drink. In the [place] one drink equals one 12 oz bottle of beer (355 mL), one 5 oz glass of wine (148 mL), or one 1 oz glass of hard liquor (44 mL). General instructions Schedule regular health, dental, and eye exams. Stay current with your vaccines. Tell your health care provider if: You often feel depressed. You have ever been abused or do not feel safe at home. Summary Adopting a healthy lifestyle and getting preventive care are important in promoting health and wellness. Follow your health care provider's instructions about healthy diet, exercising, and getting tested or screened for diseases. Follow your health  care provider's instructions on monitoring your cholesterol and blood pressure. This information is not intended to replace advice given to you by your health care provider. Make sure you discuss any questions you have with your health care provider. Document Revised: 06/20/2020 Document Reviewed: 04/05/2018 Elsevier Patient Education  2022 [employer].

## 2021-01-29 ENCOUNTER — Telehealth: Payer: Self-pay | Admitting: Podiatry

## 2021-01-29 ENCOUNTER — Encounter: Payer: Self-pay | Admitting: Family Medicine

## 2021-01-29 NOTE — Telephone Encounter (Signed)
Patient wanted to know when she need to get her xray's for her left leg.  She was calling to follow up

## 2021-01-31 ENCOUNTER — Other Ambulatory Visit: Payer: Self-pay | Admitting: Family Medicine

## 2021-01-31 ENCOUNTER — Other Ambulatory Visit: Payer: Self-pay | Admitting: Neurology

## 2021-01-31 DIAGNOSIS — E1169 Type 2 diabetes mellitus with other specified complication: Secondary | ICD-10-CM

## 2021-01-31 DIAGNOSIS — R600 Localized edema: Secondary | ICD-10-CM

## 2021-01-31 DIAGNOSIS — I10 Essential (primary) hypertension: Secondary | ICD-10-CM

## 2021-02-04 DIAGNOSIS — M6282 Rhabdomyolysis: Secondary | ICD-10-CM | POA: Diagnosis not present

## 2021-02-14 DIAGNOSIS — Z0100 Encounter for examination of eyes and vision without abnormal findings: Secondary | ICD-10-CM | POA: Diagnosis not present

## 2021-02-14 DIAGNOSIS — E119 Type 2 diabetes mellitus without complications: Secondary | ICD-10-CM | POA: Diagnosis not present

## 2021-02-18 ENCOUNTER — Ambulatory Visit: Payer: Medicare Other | Admitting: Family Medicine

## 2021-02-20 ENCOUNTER — Other Ambulatory Visit: Payer: Self-pay | Admitting: Family Medicine

## 2021-02-20 DIAGNOSIS — R600 Localized edema: Secondary | ICD-10-CM

## 2021-02-20 DIAGNOSIS — I10 Essential (primary) hypertension: Secondary | ICD-10-CM

## 2021-02-20 DIAGNOSIS — E1169 Type 2 diabetes mellitus with other specified complication: Secondary | ICD-10-CM

## 2021-02-21 DIAGNOSIS — Z1231 Encounter for screening mammogram for malignant neoplasm of breast: Secondary | ICD-10-CM | POA: Diagnosis not present

## 2021-02-21 LAB — HM MAMMOGRAPHY

## 2021-02-23 ENCOUNTER — Ambulatory Visit
Admission: RE | Admit: 2021-02-23 | Discharge: 2021-02-23 | Disposition: A | Discharge status: Home / Self Care | Payer: Medicare Other | Source: Ambulatory Visit | Attending: Neurology | Admitting: Neurology

## 2021-02-23 ENCOUNTER — Ambulatory Visit (HOSPITAL_COMMUNITY)
Admission: RE | Admit: 2021-02-23 | Discharge: 2021-02-23 | Disposition: A | Discharge status: Home / Self Care | Payer: Medicare Other | Source: Ambulatory Visit | Attending: Podiatry | Admitting: Podiatry

## 2021-02-23 ENCOUNTER — Other Ambulatory Visit: Payer: Self-pay

## 2021-02-23 ENCOUNTER — Ambulatory Visit (INDEPENDENT_AMBULATORY_CARE_PROVIDER_SITE_OTHER): Payer: Medicare Other | Admitting: Neurology

## 2021-02-23 VITALS — BP 133/73 | HR 91 | Ht 64.0 in | Wt 218.0 lb

## 2021-02-23 DIAGNOSIS — M79604 Pain in right leg: Secondary | ICD-10-CM | POA: Diagnosis not present

## 2021-02-23 DIAGNOSIS — M25552 Pain in left hip: Secondary | ICD-10-CM

## 2021-02-23 DIAGNOSIS — R269 Unspecified abnormalities of gait and mobility: Secondary | ICD-10-CM | POA: Diagnosis not present

## 2021-02-23 DIAGNOSIS — M79605 Pain in left leg: Secondary | ICD-10-CM | POA: Insufficient documentation

## 2021-02-23 DIAGNOSIS — M545 Low back pain, unspecified: Secondary | ICD-10-CM | POA: Insufficient documentation

## 2021-02-23 NOTE — Progress Notes (Signed)
ABI completed. Refer to "CV Proc" under chart review to view preliminary results.  02/23/2021 10:22 AM Kelby Aline., MHA, RVT, RDCS, RDMS
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
## 2021-02-24 ENCOUNTER — Telehealth: Payer: Self-pay | Admitting: Neurology

## 2021-02-24 NOTE — Telephone Encounter (Signed)
I called patient. I advised her of the x-ray results. Patient will let us know if she has questions or concerns. Pt verbalized understanding of results.

## 2021-02-24 NOTE — Telephone Encounter (Signed)
Please call patient there was no significant abnormality on x-ray of left hip

## 2021-02-26 ENCOUNTER — Encounter: Payer: Self-pay | Admitting: Family Medicine

## 2021-02-27 ENCOUNTER — Telehealth: Payer: Self-pay | Admitting: *Deleted

## 2021-02-27 NOTE — Telephone Encounter (Signed)
-----   Message from Trula Slade, DPM sent at 02/27/2021  7:34 AM EDT ----- [name]- please let her know that the circulation test was normal. Thanks.

## 2021-02-27 NOTE — Telephone Encounter (Signed)
Called and spoke with the patient and relayed the message per Dr Jacqualyn Posey. Amanda Morris

## 2021-03-03 ENCOUNTER — Encounter: Payer: Self-pay | Admitting: Neurology

## 2021-03-07 DIAGNOSIS — M6282 Rhabdomyolysis: Secondary | ICD-10-CM | POA: Diagnosis not present

## 2021-03-10 DIAGNOSIS — M48062 Spinal stenosis, lumbar region with neurogenic claudication: Secondary | ICD-10-CM | POA: Diagnosis not present

## 2021-03-10 DIAGNOSIS — R030 Elevated blood-pressure reading, without diagnosis of hypertension: Secondary | ICD-10-CM | POA: Diagnosis not present

## 2021-03-12 ENCOUNTER — Other Ambulatory Visit: Payer: Self-pay | Admitting: Neurosurgery

## 2021-03-12 DIAGNOSIS — M48062 Spinal stenosis, lumbar region with neurogenic claudication: Secondary | ICD-10-CM

## 2021-03-26 ENCOUNTER — Other Ambulatory Visit: Payer: Self-pay | Admitting: Family Medicine

## 2021-03-26 DIAGNOSIS — R600 Localized edema: Secondary | ICD-10-CM

## 2021-03-31 ENCOUNTER — Other Ambulatory Visit (HOSPITAL_COMMUNITY): Payer: Self-pay | Admitting: Neurosurgery

## 2021-03-31 ENCOUNTER — Other Ambulatory Visit: Payer: Self-pay | Admitting: Neurosurgery

## 2021-03-31 ENCOUNTER — Other Ambulatory Visit: Payer: Self-pay | Admitting: Family Medicine

## 2021-03-31 DIAGNOSIS — E1169 Type 2 diabetes mellitus with other specified complication: Secondary | ICD-10-CM

## 2021-03-31 DIAGNOSIS — M4316 Spondylolisthesis, lumbar region: Secondary | ICD-10-CM

## 2021-03-31 DIAGNOSIS — I10 Essential (primary) hypertension: Secondary | ICD-10-CM

## 2021-04-04 ENCOUNTER — Other Ambulatory Visit: Payer: Self-pay | Admitting: Neurology

## 2021-04-06 ENCOUNTER — Other Ambulatory Visit: Payer: Self-pay | Admitting: Neurosurgery

## 2021-04-06 DIAGNOSIS — M6282 Rhabdomyolysis: Secondary | ICD-10-CM | POA: Diagnosis not present

## 2021-04-08 ENCOUNTER — Other Ambulatory Visit: Payer: Self-pay | Admitting: Neurosurgery

## 2021-04-08 DIAGNOSIS — M4316 Spondylolisthesis, lumbar region: Secondary | ICD-10-CM

## 2021-04-09 ENCOUNTER — Other Ambulatory Visit: Payer: Self-pay | Admitting: Physician Assistant

## 2021-04-14 ENCOUNTER — Other Ambulatory Visit: Payer: Medicare Other

## 2021-04-15 ENCOUNTER — Other Ambulatory Visit (HOSPITAL_COMMUNITY): Payer: Medicare Other

## 2021-04-15 ENCOUNTER — Ambulatory Visit (HOSPITAL_COMMUNITY): Payer: Medicare Other

## 2021-04-15 ENCOUNTER — Encounter (HOSPITAL_COMMUNITY): Payer: Self-pay

## 2021-04-17 ENCOUNTER — Other Ambulatory Visit: Payer: Self-pay | Admitting: Family Medicine

## 2021-04-17 DIAGNOSIS — I10 Essential (primary) hypertension: Secondary | ICD-10-CM

## 2021-04-17 DIAGNOSIS — E1169 Type 2 diabetes mellitus with other specified complication: Secondary | ICD-10-CM

## 2021-04-23 ENCOUNTER — Ambulatory Visit (INDEPENDENT_AMBULATORY_CARE_PROVIDER_SITE_OTHER): Payer: Medicare Other | Admitting: Family Medicine

## 2021-04-23 ENCOUNTER — Other Ambulatory Visit: Payer: Self-pay

## 2021-04-23 ENCOUNTER — Encounter: Payer: Self-pay | Admitting: Family Medicine

## 2021-04-23 VITALS — BP 118/76 | HR 87 | Temp 98.4°F | Resp 16 | Ht 64.0 in | Wt 213.0 lb

## 2021-04-23 DIAGNOSIS — E785 Hyperlipidemia, unspecified: Secondary | ICD-10-CM

## 2021-04-23 DIAGNOSIS — I10 Essential (primary) hypertension: Secondary | ICD-10-CM | POA: Diagnosis not present

## 2021-04-23 DIAGNOSIS — E1169 Type 2 diabetes mellitus with other specified complication: Secondary | ICD-10-CM

## 2021-04-23 DIAGNOSIS — Z794 Long term (current) use of insulin: Secondary | ICD-10-CM | POA: Diagnosis not present

## 2021-04-23 DIAGNOSIS — K2100 Gastro-esophageal reflux disease with esophagitis, without bleeding: Secondary | ICD-10-CM | POA: Diagnosis not present

## 2021-04-23 LAB — POCT GLYCOSYLATED HEMOGLOBIN (HGB A1C): Hemoglobin A1C: 7 % — AB (ref 4.0–5.6)

## 2021-04-23 MED ORDER — SUCRALFATE 1 G PO TABS: 1.0000 g | ORAL_TABLET | Freq: Three times a day (TID) | ORAL | 1 refills | Status: DC

## 2021-04-23 MED ORDER — ATORVASTATIN CALCIUM 40 MG PO TABS
40.0000 mg | ORAL_TABLET | Freq: Every day | ORAL | 3 refills | Status: DC
Start: 2021-04-23 — End: 2022-05-03

## 2021-04-23 NOTE — Progress Notes (Signed)
Established Patient Office Visit  Subjective:  Patient ID: Amanda Morris, female    DOB: May 23, 1950  Age: 70 y.o. MRN: [id number]  CC:  Chief Complaint  Patient presents with   Diabetes    Follow up    Gastroesophageal Reflux    Patient states acid comes up into her mouth mainly after eating for 2 months     HPI Amanda Morris presents for   Diabetes - no hypoglycemic events. No wounds or sores that are not healing well. No increased thirst or urination. Checking glucose at home. Taking medications as prescribed without any side effects.  Hypertension- Pt denies chest pain, SOB, dizziness, or heart palpitations.  Taking meds as directed w/o problems.  Denies medication side effects.     GERD -  Acid comes up into her mouth mainly after eating for 2 months   Past Medical History:  Diagnosis Date   Allergy    Diabetes mellitus without complication (HCC)    type 2   GERD (gastroesophageal reflux disease)    Headache    History of hiatal hernia    Hypercholesterolemia    Numbness    left, outer thigh   Palpitations    in the past, no current issues per patient   Postlaminectomy syndrome     Past Surgical History:  Procedure Laterality Date   ABDOMINAL HYSTERECTOMY  1982   BACK SURGERY     x 2    BREAST REDUCTION SURGERY Bilateral 03/17/2016   Procedure: MAMMARY REDUCTION  (BREAST);  Surgeon: Wallace Going, DO;  Location: [place];  Service: Plastics;  Laterality: Bilateral;   BREAST SURGERY     COLONOSCOPY     LUMBAR WOUND DEBRIDEMENT N/A 05/22/2020   Procedure: Incision and drainage of lumbar wound;  Surgeon: Ashok Pall, MD;  Location: [place];  Service: Neurosurgery;  Laterality: N/A;   SHOULDER SURGERY  06/2008, 09/2010   Right, by Dr. Karie Soda, then Dr. Judeth Horn   SPINAL CORD STIMULATOR INSERTION N/A 12/02/2017   Procedure: LUMBAR SPINAL CORD STIMULATOR INSERTION;  Surgeon: Ashok Pall, MD;  Location: [place];  Service:  Neurosurgery;  Laterality: N/A;   SPINAL CORD STIMULATOR TRIAL N/A 11/25/2017   Procedure: INSERTION LUMBAR SPINAL CORD STIMULATOR TRIAL  ;  Surgeon: Ashok Pall, MD;  Location: [place];  Service: Neurosurgery;  Laterality: N/A;   INSERTION LUMBAR SPINAL CORD STIMULATOR TRIAL     UPPER GI ENDOSCOPY      Family History  Problem Relation Age of Onset   Heart disease Mother    Diabetes Mother    Hypertension Mother    Stroke Mother    Heart disease Father    Heart disease Sister    Diabetes Sister    Hyperlipidemia Sister    Hypertension Sister    Stroke Sister    Alcohol abuse Brother    Hyperlipidemia Brother     Social History   Socioeconomic History   Marital status: Divorced    Spouse name: Not on file   Number of children: 1   Years of education: 10th grade   Highest education level: 10th grade  Occupational History   Occupation: Disabled.     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 She had one child, who passed away this year. She enjoys spending time with her nieces and nephews and she also enjoys reading there bible.   Social Determinants of Health   Financial Resource Strain: Low Risk    Difficulty of Paying Living Expenses: Not hard at all  Food Insecurity: No Food Insecurity   Worried About Charity fundraiser in the Last Year: Never true   [place] in the Last Year: Never true  Transportation Needs: No Transportation Needs   Lack of Transportation (Medical): No   Lack of Transportation (Non-Medical): No  Physical Activity: Inactive    Days of Exercise per Week: 0 days   Minutes of Exercise per Session: 0 min  Stress: No Stress Concern Present   Feeling of Stress : Not at all  Social Connections: Moderately Isolated   Frequency of Communication with Friends and Family: More than three times a week   Frequency of Social Gatherings with Friends and Family: Three times a week   Attends Religious Services: More than 4 times per year   Active Member of Clubs or Organizations: No   Attends Archivist Meetings: Never   Marital Status: Divorced  Human resources officer Violence: Not At Risk   Fear of Current or Ex-Partner: No   Emotionally Abused: No   Physically Abused: No   Sexually Abused: No    Outpatient Medications Prior to Visit  Medication Sig Dispense Refill   ACCU-CHEK AVIVA PLUS test strip FOR TESTING BLOOD SUGARS 3 TIMES DAILY. DX:E11.9 300 strip 12   Accu-Chek Softclix Lancets lancets DX:E11.9 USE AS DIRECTED 100 each PRN   aspirin EC 81 MG tablet Take 1 tablet (81 mg total) by mouth daily.     B-D ULTRAFINE III SHORT PEN 31G X 8 MM MISC USE AS DIRECTED 100 each 6   Blood Glucose Monitoring Suppl (ACCU-CHEK AVIVA PLUS) w/Device KIT Check blood sugars 3 times daily DX:E11.9 1 kit prn   cholecalciferol (VITAMIN D) 1000 units tablet Take 1,000 Units by mouth daily.     dexlansoprazole (DEXILANT) 60 MG capsule Take 60 mg by mouth daily.     dicyclomine (BENTYL) 20 MG tablet Take 20 mg by mouth every 6 (six) hours as needed for spasms (cramps/stomach pain).     doxepin (SINEQUAN) 10 MG capsule Take 10 mg by mouth at bedtime.     famotidine (PEPCID) 20 MG tablet PLEASE SEE ATTACHED FOR DETAILED DIRECTIONS     Ferrous Sulfate ([employer] SLOW RELEASE IRON PO) Take 1 tablet by mouth daily.     fluticasone (FLONASE) 50 MCG/ACT nasal spray PLACE 1 SPRAY INTO BOTH NOSTRILS DAILY. 48 mL 4   glipiZIDE (GLUCOTROL) 10 MG tablet Take 10 mg by mouth 2 (two) times daily.     hydrochlorothiazide (HYDRODIURIL) 25 MG tablet TAKE 1  TABLET (25 MG TOTAL) BY MOUTH DAILY AS NEEDED. FOR SWELLING 30 tablet 1   insulin glargine (LANTUS SOLOSTAR) 100 UNIT/ML Solostar Pen Inject 36 Units into the skin at bedtime as needed (high blood sugar). (Patient taking differently: Inject 36 Units into the skin at bedtime as needed (high blood sugar). Patient currently using 20-30 units daily) 15 mL 6   JARDIANCE 25 MG TABS  tablet TAKE 25 MG BY MOUTH DAILY. 90 tablet 1   Krill Oil 500 MG CAPS Take 500 mg by mouth daily.     levocetirizine (XYZAL) 5 MG tablet Take 1 tablet (5 mg total) by mouth every evening. 90 tablet 3   Multiple Vitamin (MULTIVITAMIN WITH MINERALS) TABS tablet Take 1 tablet by mouth daily.     OVER THE COUNTER MEDICATION Take 1 tablet by mouth 3 (three) times daily. Golo otc weight loss supplement     perphenazine (TRILAFON) 8 MG tablet Take 8 mg by mouth at bedtime.  2   pregabalin (LYRICA) 200 MG capsule TAKE 1 CAPSULE (200 MG TOTAL) BY MOUTH 3 (THREE) TIMES DAILY. 90 capsule 5   atorvastatin (LIPITOR) 40 MG tablet TAKE 1 TABLET BY MOUTH EVERY DAY 90 tablet 3   pravastatin (PRAVACHOL) 40 MG tablet Take 40 mg by mouth at bedtime.     No facility-administered medications prior to visit.    Allergies  Allergen Reactions   Penicillins Hives and Other (See Comments)    PATIENT HAS HAD A PCN REACTION WITH IMMEDIATE RASH, FACIAL/TONGUE/THROAT SWELLING, SOB, OR LIGHTHEADEDNESS WITH HYPOTENSION:  #  #  YES  #  #  Has patient had a PCN reaction causing severe rash involving mucus membranes or skin necrosis: No Has patient had a PCN reaction that required hospitalization: No Has patient had a PCN reaction occurring within the last 10 years: No If all of the above answers are "NO", then may proceed with Cephalosporin use.    Duloxetine Other (See Comments)    GI upset   Tramadol Other (See Comments)    Nervousness, causes hands to shake   Aspirin Nausea Only   Codeine Itching   Hydrocodone Nausea Only and Other (See Comments)     Nose bleed   Metformin And Related Diarrhea and Nausea Only   Oxycodone Nausea And Vomiting    GI Upset (intolerance)   Victoza [Liraglutide] Other (See Comments)    Abdominal pain    ROS Review of Systems    Objective:    Physical Exam Constitutional:      Appearance: Normal appearance. She is well-developed.  HENT:     Head: Normocephalic and atraumatic.  Cardiovascular:     Rate and Rhythm: Normal rate and regular rhythm.     Heart sounds: Normal heart sounds.  Pulmonary:     Effort: Pulmonary effort is normal.     Breath sounds: Normal breath sounds.  Skin:    General: Skin is warm and dry.  Neurological:     Mental Status: She is alert and oriented to person, place, and time.  Psychiatric:        Behavior: Behavior normal.    BP 118/76    Pulse 87    Temp 98.4 F (36.9 C)    Resp 16    Ht 5' 4"  (1.626 m)    Wt 213 lb (96.6 kg)    LMP  (LMP Unknown)    SpO2 98%    BMI 36.56 kg/m  Wt Readings from Last 3 Encounters:  04/23/21 213 lb (96.6 kg)  02/23/21 218 lb (98.9 kg)  01/22/21 222 lb (100.7 kg)     There are no preventive care reminders to display for this patient.  There are no preventive care reminders to display for this patient.  Lab Results  Component Value Date   TSH 1.73 12/25/2020   Lab Results  Component Value Date   WBC 12.9 (H)  12/25/2020   HGB 11.3 (L) 12/25/2020   HCT 39.7 12/25/2020   MCV 71.3 (L) 12/25/2020   PLT 350 12/25/2020   Lab Results  Component Value Date   NA 142 12/25/2020   K 4.1 12/25/2020   CO2 28 12/25/2020   GLUCOSE 133 (H) 12/25/2020   BUN 11 12/25/2020   CREATININE 0.73 12/25/2020   BILITOT 0.3 12/25/2020   ALKPHOS 52 10/15/2020   AST 18 12/25/2020   ALT 16 12/25/2020   PROT 7.3 12/25/2020   ALBUMIN 2.4 (L) 10/15/2020   CALCIUM 9.1 12/25/2020   ANIONGAP 6 10/16/2020   EGFR 90 12/25/2020   Lab Results  Component Value Date   CHOL 127 10/13/2020   Lab Results  Component Value Date   HDL 32 (L)  10/13/2020   Lab Results  Component Value Date   LDLCALC 75 10/13/2020   Lab Results  Component Value Date   TRIG 99 10/13/2020   Lab Results  Component Value Date   CHOLHDL 4.0 10/13/2020   Lab Results  Component Value Date   HGBA1C 7.0 (A) 04/23/2021      Assessment & Plan:   Problem List Items Addressed This Visit       Cardiovascular and Mediastinum   Essential hypertension    Well controlled. Continue current regimen. Follow up in  6 mo       Relevant Medications   atorvastatin (LIPITOR) 40 MG tablet     Digestive   GERD (gastroesophageal reflux disease)    She actively follows at digestive health and I like to get her back in for an appointment she is having some breakthrough GERD symptoms on her Dexilant.  We did add Carafate temporarily.      Relevant Medications   sucralfate (CARAFATE) 1 g tablet   Other Relevant Orders   Ambulatory referral to Gastroenterology     Endocrine   Type 2 diabetes mellitus with other specified complication ([place]) - Primary    Well controlled overall.  She has been having some lows in the evening.  She adjusts her long acting insulin from 20-30 units.  She has been taking half a tab of her Jardiance .  We will have her increase her Jardiance back to a whole tab daily and then decrease her insulin by 2 units.  Follow up in  3 mo we also discussed the possibility of a continuous glucose meter and if she is interested to please let me know and we will get her scheduled with our clinical pharmacist.      Relevant Medications   glipiZIDE (GLUCOTROL) 10 MG tablet   atorvastatin (LIPITOR) 40 MG tablet   Other Relevant Orders   POCT glycosylated hemoglobin (Hb A1C) (Completed)   Hyperlipidemia associated with type 2 diabetes mellitus ([place])    Following statin well.  Refill sent to pharmacy.      Relevant Medications   glipiZIDE (GLUCOTROL) 10 MG tablet   atorvastatin (LIPITOR) 40 MG tablet    Encouraged her to schedule her eye  exam.    Meds ordered this encounter  Medications   atorvastatin (LIPITOR) 40 MG tablet    Sig: Take 1 tablet (40 mg total) by mouth at bedtime.    Dispense:  90 tablet    Refill:  3   sucralfate (CARAFATE) 1 g tablet    Sig: Take 1 tablet (1 g total) by mouth 4 (four) times daily -  with meals and at bedtime.    Dispense:  120  tablet    Refill:  1    Follow-up: Return in about 3 months (around 07/27/2021) for Diabetes follow-up.    Beatrice Lecher, MD

## 2021-04-23 NOTE — Assessment & Plan Note (Signed)
Following statin well.  Refill sent to pharmacy.

## 2021-04-23 NOTE — Assessment & Plan Note (Signed)
Well controlled. Continue current regimen. Follow up in  6 mo  

## 2021-04-23 NOTE — Assessment & Plan Note (Addendum)
Well controlled overall.  She has been having some lows in the evening.  She adjusts her long acting insulin from 20-30 units.  She has been taking half a tab of her Jardiance .  We will have her increase her Jardiance back to a whole tab daily and then decrease her insulin by 2 units.  Follow up in  3 mo we also discussed the possibility of a continuous glucose meter and if she is interested to please let me know and we will get her scheduled with our clinical pharmacist.

## 2021-04-23 NOTE — Assessment & Plan Note (Signed)
She actively follows at digestive health and I like to get her back in for an appointment she is having some breakthrough GERD symptoms on her Dexilant.  We did add Carafate temporarily.

## 2021-04-24 ENCOUNTER — Ambulatory Visit
Admission: RE | Admit: 2021-04-24 | Discharge: 2021-04-24 | Disposition: A | Discharge status: Home / Self Care | Payer: Medicare Other | Source: Ambulatory Visit | Attending: Neurosurgery | Admitting: Neurosurgery

## 2021-04-24 DIAGNOSIS — M4056 Lordosis, unspecified, lumbar region: Secondary | ICD-10-CM | POA: Diagnosis not present

## 2021-04-24 DIAGNOSIS — M4316 Spondylolisthesis, lumbar region: Secondary | ICD-10-CM | POA: Diagnosis not present

## 2021-04-24 DIAGNOSIS — M48061 Spinal stenosis, lumbar region without neurogenic claudication: Secondary | ICD-10-CM | POA: Diagnosis not present

## 2021-04-24 DIAGNOSIS — M4326 Fusion of spine, lumbar region: Secondary | ICD-10-CM | POA: Diagnosis not present

## 2021-04-24 MED ORDER — DIAZEPAM 5 MG PO TABS
5.0000 mg | ORAL_TABLET | Freq: Once | ORAL | Status: AC
  Administered 2021-04-24: 5 mg via ORAL

## 2021-04-24 MED ORDER — IOPAMIDOL (ISOVUE-M 200) INJECTION 41%
20.0000 mL | Freq: Once | INTRAMUSCULAR | Status: AC
  Administered 2021-04-24: 20 mL via INTRATHECAL

## 2021-04-24 MED ORDER — ONDANSETRON HCL 4 MG/2ML IJ SOLN: 4.0000 mg | Freq: Once | INTRAMUSCULAR | Status: DC | PRN

## 2021-04-24 MED ORDER — MEPERIDINE HCL 50 MG/ML IJ SOLN: 50.0000 mg | Freq: Once | INTRAMUSCULAR | Status: DC | PRN

## 2021-04-24 NOTE — Discharge Instructions (Signed)

## 2021-04-24 NOTE — Progress Notes (Signed)
Pt reports her spinal cord stimulator has been turned off/procedure mode for her myelogram procedure.

## 2021-04-25 ENCOUNTER — Other Ambulatory Visit: Payer: Self-pay | Admitting: Family Medicine

## 2021-04-26 ENCOUNTER — Other Ambulatory Visit: Payer: Self-pay | Admitting: Family Medicine

## 2021-04-26 DIAGNOSIS — R600 Localized edema: Secondary | ICD-10-CM

## 2021-05-07 DIAGNOSIS — M6282 Rhabdomyolysis: Secondary | ICD-10-CM | POA: Diagnosis not present

## 2021-05-09 ENCOUNTER — Other Ambulatory Visit: Payer: Self-pay | Admitting: Family Medicine

## 2021-05-19 DIAGNOSIS — M4316 Spondylolisthesis, lumbar region: Secondary | ICD-10-CM | POA: Diagnosis not present

## 2021-05-25 DIAGNOSIS — K219 Gastro-esophageal reflux disease without esophagitis: Secondary | ICD-10-CM | POA: Diagnosis not present

## 2021-05-25 DIAGNOSIS — R1084 Generalized abdominal pain: Secondary | ICD-10-CM | POA: Diagnosis not present

## 2021-05-25 DIAGNOSIS — D509 Iron deficiency anemia, unspecified: Secondary | ICD-10-CM | POA: Diagnosis not present

## 2021-05-25 DIAGNOSIS — K589 Irritable bowel syndrome without diarrhea: Secondary | ICD-10-CM | POA: Diagnosis not present

## 2021-05-25 DIAGNOSIS — Z86010 Personal history of colonic polyps: Secondary | ICD-10-CM | POA: Diagnosis not present

## 2021-06-02 ENCOUNTER — Other Ambulatory Visit: Payer: Self-pay | Admitting: Family Medicine

## 2021-06-02 DIAGNOSIS — R600 Localized edema: Secondary | ICD-10-CM

## 2021-06-07 DIAGNOSIS — M6282 Rhabdomyolysis: Secondary | ICD-10-CM | POA: Diagnosis not present

## 2021-06-08 ENCOUNTER — Ambulatory Visit (INDEPENDENT_AMBULATORY_CARE_PROVIDER_SITE_OTHER): Payer: Medicare Other | Admitting: Family Medicine

## 2021-06-08 ENCOUNTER — Other Ambulatory Visit: Payer: Self-pay

## 2021-06-08 ENCOUNTER — Encounter: Payer: Self-pay | Admitting: Family Medicine

## 2021-06-08 ENCOUNTER — Telehealth: Payer: Self-pay | Admitting: *Deleted

## 2021-06-08 VITALS — BP 115/67 | HR 94 | Resp 18 | Ht 64.0 in | Wt 214.0 lb

## 2021-06-08 DIAGNOSIS — G8929 Other chronic pain: Secondary | ICD-10-CM | POA: Diagnosis not present

## 2021-06-08 DIAGNOSIS — Z794 Long term (current) use of insulin: Secondary | ICD-10-CM

## 2021-06-08 DIAGNOSIS — M5441 Lumbago with sciatica, right side: Secondary | ICD-10-CM | POA: Diagnosis not present

## 2021-06-08 DIAGNOSIS — M5442 Lumbago with sciatica, left side: Secondary | ICD-10-CM | POA: Diagnosis not present

## 2021-06-08 DIAGNOSIS — E1169 Type 2 diabetes mellitus with other specified complication: Secondary | ICD-10-CM

## 2021-06-08 MED ORDER — GLIPIZIDE 10 MG PO TABS: 10.0000 mg | ORAL_TABLET | Freq: Two times a day (BID) | ORAL | 0 refills | Status: DC

## 2021-06-08 NOTE — Progress Notes (Signed)
Established Patient Office Visit  Subjective:  Patient ID: Amanda Morris, female    DOB: Oct 24, 1950  Age: 71 y.o. MRN: [id number]  CC:  Chief Complaint  Patient presents with   Referral    [facility] Dr[facility] for back surgery.     HPI Amanda Morris presents for new referral for 2nd opinion for back surgery.  They are wanting to get back in and remove some pins that she previously had placed.  Her current surgeon who is done 5 operations on her says that the pins have shifted and he thinks that is causing a lot of her pain.  She has been very physically inactive because of the pain and says she has to lay down to get comfortable.  Diabetes-she is noticing her blood sugars are still running in the 150s in the mornings fasting.  Even though she did go back to a whole tab on the glipizide.  Past Medical History:  Diagnosis Date   Allergy    Diabetes mellitus without complication ([place])    type 2   GERD (gastroesophageal reflux disease)    Headache    History of hiatal hernia    Hypercholesterolemia    Numbness    left, outer thigh   Palpitations    in the past, no current issues per patient   Postlaminectomy syndrome     Past Surgical History:  Procedure Laterality Date   ABDOMINAL HYSTERECTOMY  1982   BACK SURGERY     x 2    BREAST REDUCTION SURGERY Bilateral 03/17/2016   Procedure: MAMMARY REDUCTION  (BREAST);  Surgeon: Wallace Going, DO;  Location: [place];  Service: Plastics;  Laterality: Bilateral;   BREAST SURGERY     COLONOSCOPY     LUMBAR WOUND DEBRIDEMENT N/A 05/22/2020   Procedure: Incision and drainage of lumbar wound;  Surgeon: Ashok Pall, MD;  Location: [place];  Service: Neurosurgery;  Laterality: N/A;   SHOULDER SURGERY  06/2008, 09/2010   Right, by Dr. Karie Soda, then Dr. Judeth Horn   SPINAL CORD STIMULATOR INSERTION N/A 12/02/2017   Procedure: LUMBAR SPINAL CORD STIMULATOR INSERTION;   Surgeon: Ashok Pall, MD;  Location: [place];  Service: Neurosurgery;  Laterality: N/A;   SPINAL CORD STIMULATOR TRIAL N/A 11/25/2017   Procedure: INSERTION LUMBAR SPINAL CORD STIMULATOR TRIAL  ;  Surgeon: Ashok Pall, MD;  Location: [place];  Service: Neurosurgery;  Laterality: N/A;   INSERTION LUMBAR SPINAL CORD STIMULATOR TRIAL     UPPER GI ENDOSCOPY      Family History  Problem Relation Age of Onset   Heart disease Mother    Diabetes Mother    Hypertension Mother    Stroke Mother    Heart disease Father    Heart disease Sister    Diabetes Sister    Hyperlipidemia Sister    Hypertension Sister    Stroke Sister    Alcohol abuse Brother    Hyperlipidemia Brother     Social History   Socioeconomic History   Marital status: Divorced    Spouse name: Not on file   Number of children: 1   Years of education: 10th grade   Highest education level: 10th grade  Occupational History   Occupation: Disabled.     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 She had one child, who passed away this year. She enjoys spending time with her nieces and nephews and she also enjoys reading there bible.   Social Determinants of Health   Financial Resource Strain: Low Risk    Difficulty of Paying Living Expenses: Not hard at all  Food Insecurity: No Food Insecurity   Worried About Charity fundraiser in the Last Year: Never true   [place] in the Last Year: Never true  Transportation Needs: No Transportation Needs   Lack of Transportation (Medical): No   Lack of  Transportation (Non-Medical): No  Physical Activity: Inactive   Days of Exercise per Week: 0 days   Minutes of Exercise per Session: 0 min  Stress: No Stress Concern Present   Feeling of Stress : Not at all  Social Connections: Moderately Isolated   Frequency of Communication with Friends and Family: More than three times a week   Frequency of Social Gatherings with Friends and Family: Three times a week   Attends Religious Services: More than 4 times per year   Active Member of Clubs or Organizations: No   Attends Archivist Meetings: Never   Marital Status: Divorced  Human resources officer Violence: Not At Risk   Fear of Current or Ex-Partner: No   Emotionally Abused: No   Physically Abused: No   Sexually Abused: No    Outpatient Medications Prior to Visit  Medication Sig Dispense Refill   ACCU-CHEK AVIVA PLUS test strip FOR TESTING BLOOD SUGARS 3 TIMES DAILY. DX:E11.9 300 strip 12   Accu-Chek Softclix Lancets lancets DX:E11.9 USE AS DIRECTED 100 each PRN   aspirin EC 81 MG tablet Take 1 tablet (81 mg total) by mouth daily.     atorvastatin (LIPITOR) 40 MG tablet Take 1 tablet (40 mg total) by mouth at bedtime. 90 tablet 3   B-D ULTRAFINE III SHORT PEN 31G X 8 MM MISC USE AS DIRECTED 100 each 6   Blood Glucose Monitoring Suppl (ACCU-CHEK AVIVA PLUS) w/Device KIT Check blood sugars 3 times daily DX:E11.9 1 kit prn   cholecalciferol (VITAMIN D) 1000 units tablet Take 1,000 Units by mouth daily.     dexlansoprazole (DEXILANT) 60 MG capsule Take 60 mg by mouth daily.     dicyclomine (BENTYL) 20 MG tablet Take 20 mg by mouth every 6 (six) hours as needed for spasms (cramps/stomach pain).     doxepin (SINEQUAN) 10 MG capsule Take 10 mg by mouth at bedtime.     Ferrous Sulfate ([employer]S SLOW RELEASE IRON PO) Take 1 tablet by mouth daily.     fluticasone (FLONASE) 50 MCG/ACT nasal spray PLACE 1 SPRAY INTO BOTH NOSTRILS DAILY. 48 mL 4   hydrochlorothiazide (HYDRODIURIL) 25 MG tablet TAKE 1  TABLET (25 MG TOTAL) BY MOUTH DAILY AS NEEDED. FOR SWELLING 30 tablet 1   insulin glargine (LANTUS SOLOSTAR) 100 UNIT/ML Solostar Pen Inject 36 Units into the skin at bedtime as needed (high blood sugar). (Patient taking differently: Inject 36 Units into the skin at bedtime as needed (high blood sugar). Patient currently using 20-30 units daily) 15 mL 6   JARDIANCE 25  MG TABS tablet TAKE 25 MG BY MOUTH DAILY. 90 tablet 1   Krill Oil 500 MG CAPS Take 500 mg by mouth daily.     levocetirizine (XYZAL) 5 MG tablet Take 1 tablet (5 mg total) by mouth every evening. 90 tablet 3   Multiple Vitamin (MULTIVITAMIN WITH MINERALS) TABS tablet Take 1 tablet by mouth daily.     OVER THE COUNTER MEDICATION Take 1 tablet by mouth 3 (three) times daily. Golo otc weight loss supplement     perphenazine (TRILAFON) 8 MG tablet Take 8 mg by mouth at bedtime.  2   pregabalin (LYRICA) 200 MG capsule TAKE 1 CAPSULE (200 MG TOTAL) BY MOUTH 3 (THREE) TIMES DAILY. 90 capsule 5   sucralfate (CARAFATE) 1 g tablet Take 1 tablet (1 g total) by mouth 4 (four) times daily -  with meals and at bedtime. 120 tablet 1   glipiZIDE (GLUCOTROL) 10 MG tablet TAKE 1 TABLET (10 MG TOTAL) BY MOUTH 2 (TWO) TIMES DAILY BEFORE A MEAL. 180 tablet 0   No facility-administered medications prior to visit.    Allergies  Allergen Reactions   Penicillins Hives and Other (See Comments)    PATIENT HAS HAD A PCN REACTION WITH IMMEDIATE RASH, FACIAL/TONGUE/THROAT SWELLING, SOB, OR LIGHTHEADEDNESS WITH HYPOTENSION:  #  #  YES  #  #  Has patient had a PCN reaction causing severe rash involving mucus membranes or skin necrosis: No Has patient had a PCN reaction that required hospitalization: No Has patient had a PCN reaction occurring within the last 10 years: No If all of the above answers are "NO", then may proceed with Cephalosporin use.    Duloxetine Other (See Comments)    GI upset   Tramadol Other (See Comments)    Nervousness, causes hands to  shake   Aspirin Nausea Only   Codeine Itching   Hydrocodone Nausea Only and Other (See Comments)    Nose bleed   Metformin And Related Diarrhea and Nausea Only   Oxycodone Nausea And Vomiting    GI Upset (intolerance)   Victoza [Liraglutide] Other (See Comments)    Abdominal pain    ROS Review of Systems    Objective:    Physical Exam Constitutional:      Appearance: Normal appearance. She is well-developed.  HENT:     Head: Normocephalic and atraumatic.  Cardiovascular:     Rate and Rhythm: Normal rate and regular rhythm.     Heart sounds: Normal heart sounds.  Pulmonary:     Effort: Pulmonary effort is normal.     Breath sounds: Normal breath sounds.  Skin:    General: Skin is warm and dry.  Neurological:     Mental Status: She is alert and oriented to person, place, and time.  Psychiatric:        Behavior: Behavior normal.    BP 115/67    Pulse 94    Resp 18    Ht _0  (1.626 m)    Wt 214 lb (97.1 kg)    LMP  (LMP Unknown)    SpO2 96%    BMI 36.73 kg/m  Wt Readings from Last 3 Encounters:  06/08/21 214 lb (97.1 kg)  04/23/21 213 lb (96.6 kg)  02/23/21 218 lb (98.9 kg)     There are no preventive care reminders to display for this patient.  There are no preventive care reminders to display for this patient.  Lab Results  Component Value Date   TSH  1.73 12/25/2020   Lab Results  Component Value Date   WBC 12.9 (H) 12/25/2020   HGB 11.3 (L) 12/25/2020   HCT 39.7 12/25/2020   MCV 71.3 (L) 12/25/2020   PLT 350 12/25/2020   Lab Results  Component Value Date   NA 142 12/25/2020   K 4.1 12/25/2020   CO2 28 12/25/2020   GLUCOSE 133 (H) 12/25/2020   BUN 11 12/25/2020   CREATININE 0.73 12/25/2020   BILITOT 0.3 12/25/2020   ALKPHOS 52 10/15/2020   AST 18 12/25/2020   ALT 16 12/25/2020   PROT 7.3 12/25/2020   ALBUMIN 2.4 (L) 10/15/2020   CALCIUM 9.1 12/25/2020   ANIONGAP 6 10/16/2020   EGFR 90 12/25/2020   Lab Results  Component Value Date    CHOL 127 10/13/2020   Lab Results  Component Value Date   HDL 32 (L) 10/13/2020   Lab Results  Component Value Date   LDLCALC 75 10/13/2020   Lab Results  Component Value Date   TRIG 99 10/13/2020   Lab Results  Component Value Date   CHOLHDL 4.0 10/13/2020   Lab Results  Component Value Date   HGBA1C 7.0 (A) 04/23/2021      Assessment & Plan:   Problem List Items Addressed This Visit       Endocrine   Type 2 diabetes mellitus with other specified complication ([place])    Go ahead and increase insulin to 28 units nightly for 1 week.  If blood sugars are still not less than 130 then increase to 30 units and try that for a week as well.  I would really like to look at putting her on a GLP-1 I think it would probably eliminate her need for insulin.  Keep regularly scheduled follow-up.  Call if any concerns in between.      Relevant Medications   glipiZIDE (GLUCOTROL) 10 MG tablet   Other Relevant Orders   AMB Referral to [facility]ordin[facility]     Nervous and Auditory   Chronic midline low back pain with bilateral sciatica - Primary   Relevant Orders   Ambulatory referral to Neurosurgery    Meds ordered this encounter  Medications   glipiZIDE (GLUCOTROL) 10 MG tablet    Sig: Take 1 tablet (10 mg total) by mouth 2 (two) times daily.    Dispense:  180 tablet    Refill:  0    Follow-up: No follow-ups on file.    Beatrice Lecher, MD

## 2021-06-08 NOTE — Patient Instructions (Signed)
Please increase your insulin to 28 units nightly for 1 week.  If you are still seeing blood sugars above 130 in the mornings then go ahead and increase to 30 units nightly.

## 2021-06-08 NOTE — Assessment & Plan Note (Signed)
Go ahead and increase insulin to 28 units nightly for 1 week.  If blood sugars are still not less than 130 then increase to 30 units and try that for a week as well.  I would really like to look at putting her on a GLP-1 I think it would probably eliminate her need for insulin.  Keep regularly scheduled follow-up.  Call if any concerns in between.

## 2021-06-08 NOTE — Chronic Care Management (AMB) (Signed)
Chronic Care Management   Note  06/08/2021 Name: Amanda Morris MRN: [id number] DOB: [birth year]  Amanda Morris is a 71 y.o. year old female who is a primary care patient of Madilyn Fireman, Rene Kocher, MD. I reached out to [name] by phone today in response to a referral sent by [name] PCP.  [name] was given information about Chronic Care Management services today including:  CCM service includes personalized support from designated clinical staff supervised by her physician, including individualized plan of care and coordination with other care providers 24/7 contact phone numbers for assistance for urgent and routine care needs. Service will only be billed when office clinical staff spend 20 minutes or more in a month to coordinate care. Only one practitioner may furnish and bill the service in a calendar month. The patient may stop CCM services at any time (effective at the end of the month) by phone call to the office staff. The patient is responsible for co-pay (up to 20% after annual deductible is met) if co-pay is required by the individual health plan.   Patient agreed to services and verbal consent obtained.   Follow up plan: Telephone appointment with care management team member scheduled for: 06/15/2021  Julian Hy, [place] Management  Direct Dial: [phone]

## 2021-06-12 ENCOUNTER — Telehealth: Payer: Self-pay | Admitting: Pharmacist

## 2021-06-12 NOTE — Chronic Care Management (AMB) (Signed)
° ° °Chronic Care Management °Pharmacy Assistant  ° °Name: Amanda Morris  MRN: [id number] DOB: 05/17/1950 ° °Amanda Morris is an 71 y.o. year old female who presents for his initial CCM visit with the clinical pharmacist. ° °Recent office visits:  °06/08/21-[name], MD (PCP) Seen for new referral for 2nd opinion for back surgery. Increase insulin to 28 units nightly for 1 week.  AMB Referral to Community Care Coordinaton. Ambulatory referral to Neurosurgery. °04/23/21-[name], MD (PCP) Seen for a diabetic follow up visit and Gastroesophageal Reflux. Ambulatory referral to Gastroenterology. Increase her Jardiance back to a whole tab daily and then decrease her insulin by 2 units. Labs ordered. Follow up in 3 months.  °01/26/21-[name], MD (PCP) Medicare annual wellness visit. Follow up in 1 year.  °01/22/21-[name], MD (PCP) Seen for follow up visit. Decrease glipizide to 5 mg twice daily. Labs ordered. Follow up in 3 months. °12/25/20-[name], MD (PCP) Seen for foot pain. Labs ordered. Flu vaccine given. Start over the counter Iron supplement. Start on Hydrochlorothiazide 25 mg. Follow up in 4 weeks.  ° °Recent consult visits:  °05/25/21-Karl A. Pleasant, PA (Gastroenterology) Seen for abdominal pain.  °03/10/21-Kyle L. Cabbell, MD (Neurosurgery) Notes not available. °02/23/21-Yijun Yan, MD (Neurology) Follow up visit.  °01/26/21-Karl A. Pleasant, PA (Gastroenterology) Labs ordered. Follow up in 3 months. °12/30/20-Matthew R. Wagoner, DPM (Podiatry) Sen for nail problem. ABI ordered. Follow up in 3 months. ° °Hospital visits:  °None in previous 6 months ° °Medications: °Outpatient Encounter Medications as of 06/12/2021  °Medication Sig  ° ACCU-CHEK AVIVA PLUS test strip FOR TESTING BLOOD SUGARS 3 TIMES DAILY. DX:E11.9  ° Accu-Chek Softclix Lancets lancets DX:E11.9 USE AS DIRECTED  ° aspirin EC 81 MG tablet Take 1 tablet (81 mg total)  by mouth daily.  ° atorvastatin (LIPITOR) 40 MG tablet Take 1 tablet (40 mg total) by mouth at bedtime.  ° B-D ULTRAFINE III SHORT PEN 31G X 8 MM MISC USE AS DIRECTED  ° Blood Glucose Monitoring Suppl (ACCU-CHEK AVIVA PLUS) w/Device KIT Check blood sugars 3 times daily DX:E11.9  ° cholecalciferol (VITAMIN D) 1000 units tablet Take 1,000 Units by mouth daily.  ° dexlansoprazole (DEXILANT) 60 MG capsule Take 60 mg by mouth daily.  ° dicyclomine (BENTYL) 20 MG tablet Take 20 mg by mouth every 6 (six) hours as needed for spasms (cramps/stomach pain).  ° doxepin (SINEQUAN) 10 MG capsule Take 10 mg by mouth at bedtime.  ° Ferrous Sulfate ([employer] SLOW RELEASE IRON PO) Take 1 tablet by mouth daily.  ° fluticasone (FLONASE) 50 MCG/ACT nasal spray PLACE 1 SPRAY INTO BOTH NOSTRILS DAILY.  ° glipiZIDE (GLUCOTROL) 10 MG tablet Take 1 tablet (10 mg total) by mouth 2 (two) times daily.  ° hydrochlorothiazide (HYDRODIURIL) 25 MG tablet TAKE 1 TABLET (25 MG TOTAL) BY MOUTH DAILY AS NEEDED. FOR SWELLING  ° insulin glargine (LANTUS SOLOSTAR) 100 UNIT/ML Solostar Pen Inject 36 Units into the skin at bedtime as needed (high blood sugar). (Patient taking differently: Inject 36 Units into the skin at bedtime as needed (high blood sugar). Patient currently using 20-30 units daily)  ° JARDIANCE 25 MG TABS tablet TAKE 25 MG BY MOUTH DAILY.  ° Krill Oil 500 MG CAPS Take 500 mg by mouth daily.  ° levocetirizine (XYZAL) 5 MG tablet Take 1 tablet (5 mg total) by mouth every evening.  ° Multiple Vitamin (MULTIVITAMIN WITH MINERALS) TABS tablet Take 1 tablet by mouth daily.  °   OVER THE COUNTER MEDICATION Take 1 tablet by mouth 3 (three) times daily. Golo otc weight loss supplement  ° perphenazine (TRILAFON) 8 MG tablet Take 8 mg by mouth at bedtime.  ° pregabalin (LYRICA) 200 MG capsule TAKE 1 CAPSULE (200 MG TOTAL) BY MOUTH 3 (THREE) TIMES DAILY.  ° sucralfate (CARAFATE) 1 g tablet Take 1 tablet (1 g total) by mouth 4 (four) times daily -  with  meals and at bedtime.  ° °No facility-administered encounter medications on file as of 06/12/2021.  ° °aspirin EC 81 MG tablet Last filled:None noted °atorvastatin (LIPITOR) 40 MG tablet Last filled:05/09/21 90 DS °cholecalciferol (VITAMIN D) 1000 units tablet Last filled:None noted °dexlansoprazole (DEXILANT) 60 MG capsule Last filled:06/07/21 90 DS °dicyclomine (BENTYL) 20 MG tablet Last filled:04/26/21 90 DS °doxepin (SINEQUAN) 10 MG capsule Last filled:03/24/21 90 DS °Ferrous Sulfate (CVS SLOW RELEASE IRON PO) Last filled:None noted °fluticasone (FLONASE) 50 MCG/ACT nasal spray Last filled:05/01/21 90 DS °glipiZIDE (GLUCOTROL) 10 MG tablet Last filled:06/08/21 90 DS °hydrochlorothiazide (HYDRODIURIL) 25 MG tablet Last filled:06/02/21 90 DS °insulin glargine (LANTUS SOLOSTAR) 100 UNIT/ML Solostar Pen Last filled:05/26/21 40 DS °JARDIANCE 25 MG TABS tablet Last filled:04/22/21 90 DS °Krill Oil 500 MG CAPS Last filled:None noted °levocetirizine (XYZAL) 5 MG tablet Last filled:05/01/21 90 DS °perphenazine (TRILAFON) 8 MG tablet Last filled:04/03/21 90 DS °pregabalin (LYRICA) 200 MG capsule Last filled:06/07/21 30 DS °sucralfate (CARAFATE) 1 g tablet Last filled:05/23/21 30 DS ° ° °Care Gaps: °None noted ° °Star Rating Drugs: °atorvastatin (LIPITOR) 40 MG tablet Last filled:05/09/21 90 DS °glipiZIDE (GLUCOTROL) 10 MG tablet Last filled:06/08/21 90 DS °JARDIANCE 25 MG TABS tablet Last filled:04/22/21 90 DS ° °Myriam Estrada, RMA °Health Concierge ° °

## 2021-06-15 ENCOUNTER — Other Ambulatory Visit: Payer: Self-pay

## 2021-06-15 ENCOUNTER — Ambulatory Visit (INDEPENDENT_AMBULATORY_CARE_PROVIDER_SITE_OTHER): Payer: Medicare Other | Admitting: Pharmacist

## 2021-06-15 DIAGNOSIS — E785 Hyperlipidemia, unspecified: Secondary | ICD-10-CM

## 2021-06-15 DIAGNOSIS — E1169 Type 2 diabetes mellitus with other specified complication: Secondary | ICD-10-CM

## 2021-06-15 DIAGNOSIS — I10 Essential (primary) hypertension: Secondary | ICD-10-CM

## 2021-06-15 DIAGNOSIS — Z794 Long term (current) use of insulin: Secondary | ICD-10-CM

## 2021-06-15 NOTE — Progress Notes (Signed)
Chronic Care Management Pharmacy Note  06/16/2021 Name:  Amanda Morris MRN:  [id number] DOB:  1950-06-01  Summary: addressed DM, HTN, HLD  Recommendations/Changes made from today's visit:  - Educated on goals of diabetes care, glucose values and a1c, as well as mechanism of action of various medicines used to control glucose. - Counseled on ozempic dosing, administration technique, and benefits of switching to GLP1 therapy - Recommended initiate ozempic 0.42m weekly. Patient requested her novonordisk application be mailed to her, she will return it to office at PCP visit, and then can begin ozempic 0.256mweekly using medication sample at next PCP visit.    Plan: f/u with patient in 3-5 months  Subjective: Amanda Morris an [age].o. year old female who is a primary patient of Amanda Morris, Amanda Morris.  The CCM team was consulted for assistance with disease management and care coordination needs.    Engaged with patient by telephone for initial visit in response to provider referral for pharmacy case management and/or care coordination services.   Consent to Services:  The patient was given the following information about Chronic Care Management services today, agreed to services, and gave verbal consent: 1. CCM service includes personalized support from designated clinical staff supervised by the primary care provider, including individualized plan of care and coordination with other care providers 2. 24/7 contact phone numbers for assistance for urgent and routine care needs. 3. Service will only be billed when office clinical staff spend 20 minutes or more in a month to coordinate care. 4. Only one practitioner may furnish and bill the service in a calendar month. 5.The patient may stop CCM services at any time (effective at the end of the month) by phone call to the office staff. 6. The patient will be responsible for cost sharing (co-pay) of up to 20% of the service fee  (after annual deductible is met). Patient agreed to services and consent obtained.  Patient Care Team: MeHali MarryMD as PCP - General (Family Medicine) WeEulis CannerMD (Gastroenterology) ThSilverio DecampMD as Consulting Physician (Family Medicine) KlDarius BumpRPKindred Hospital Northern IndianaPharmacist)  Recent office visits:  06/08/21-Amanda D. MeMadilyn Morris (PCP) Seen for new referral for 2nd opinion for back surgery. Increase insulin to 28 units nightly for 1 week.  [facility]. 04/23/21-Amanda D. Metheney, MD (PCP) Seen for a diabetic follow up visit and Gastroesophageal Reflux. Ambulatory referral to Gastroenterology. Increase her Jardiance back to a whole tab daily and then decrease her insulin by 2 units. Labs ordered. Follow up in 3 months.  01/26/21-Amanda D. MeMadilyn Morris (PCP) Medicare annual wellness visit. Follow up in 1 year.  01/22/21-Amanda D. MeMadilyn Morris (PCP) Seen for follow up visit. Decrease glipizide to 5 mg twice daily. Labs ordered. Follow up in 3 months. 12/25/20-Amanda D. MeMadilyn Morris (PCP) Seen for foot pain. Labs ordered. Flu vaccine given. Start over the counter Iron supplement. Start on Hydrochlorothiazide 25 mg. Follow up in 4 weeks.    Recent consult visits:  05/25/21-Amanda A. Pleasant, PA (Gastroenterology) Seen for abdominal pain.  03/10/21-Amanda L. CaChristella NoaMD (Neurosurgery) Notes not available. 02/23/21-Amanda YaKrista Morris (Neurology) Follow up visit.  01/26/21-Amanda A. Pleasant, PA (Gastroenterology) Labs ordered. Follow up in 3 months. 12/30/20-Amanda R. WaJacqualyn Morris (Podiatry) Sen for nail problem. ABI ordered. Follow up in 3 months.   Hospital visits:  None in previous 6 months  Objective:  Lab Results  Component Value Date  CREATININE 0.73 12/25/2020   CREATININE 0.76 11/18/2020   CREATININE 0.56 10/16/2020    Lab Results  Component Value Date   HGBA1C 7.0 (A)  04/23/2021   Last diabetic Eye exam:  Lab Results  Component Value Date/Time   HMDIABEYEEXA No Retinopathy 11/02/2018 12:00 AM    Last diabetic Foot exam: No results found for: HMDIABFOOTEX      Component Value Date/Time   CHOL 127 10/13/2020 1759   TRIG 99 10/13/2020 1759   HDL 32 (L) 10/13/2020 1759   CHOLHDL 4.0 10/13/2020 1759   VLDL 20 10/13/2020 1759   LDLCALC 75 10/13/2020 1759   LDLCALC 123 (H) 06/02/2020 0000    Hepatic Function Latest Ref Rng & Units 12/25/2020 10/15/2020 10/14/2020  Total Protein 6.1 - 8.1 g/dL 7.3 5.2(L) 5.5(L)  Albumin 3.5 - 5.0 g/dL - 2.4(L) 2.4(L)  AST 10 - 35 U/L 18 24 29   ALT 6 - 29 U/L 16 22 24   Alk Phosphatase 38 - 126 U/L - 52 50  Total Bilirubin 0.2 - 1.2 mg/dL 0.3 0.1(L) 0.6    Lab Results  Component Value Date/Time   TSH 1.73 12/25/2020 12:00 AM   TSH 1.650 11/28/2019 08:54 AM    CBC Latest Ref Rng & Units 12/25/2020 11/18/2020 10/15/2020  WBC 3.8 - 10.8 Thousand/uL 12.9(H) 10.2 8.2  Hemoglobin 11.7 - 15.5 g/dL 11.3(L) 11.1(L) 9.2(L)  Hematocrit 35.0 - 45.0 % 39.7 38.5 29.7(L)  Platelets 140 - 400 Thousand/uL 350 429(H) 233    Lab Results  Component Value Date/Time   VD25OH 33.5 11/28/2019 08:54 AM    Social History   Tobacco Use  Smoking Status Former   Types: Cigarettes   Quit date: 07/30/2007   Years since quitting: 13.8  Smokeless Tobacco Never   BP Readings from Last 3 Encounters:  06/08/21 115/67  04/24/21 111/66  04/23/21 118/76   Pulse Readings from Last 3 Encounters:  06/08/21 94  04/24/21 65  04/23/21 87   Wt Readings from Last 3 Encounters:  06/08/21 214 lb (97.1 kg)  04/23/21 213 lb (96.6 kg)  02/23/21 218 lb (98.9 kg)    Assessment: Review of patient past medical history, allergies, medications, health status, including review of consultants reports, laboratory and other test data, was performed as part of comprehensive evaluation and provision of chronic care management services.   SDOH:  (Social  Determinants of Health) assessments and interventions performed:    CCM Care Plan  Allergies  Allergen Reactions   Penicillins Hives and Other (See Comments)    PATIENT HAS HAD A PCN REACTION WITH IMMEDIATE RASH, FACIAL/TONGUE/THROAT SWELLING, SOB, OR LIGHTHEADEDNESS WITH HYPOTENSION:  #  #  YES  #  #  Has patient had a PCN reaction causing severe rash involving mucus membranes or skin necrosis: No Has patient had a PCN reaction that required hospitalization: No Has patient had a PCN reaction occurring within the last 10 years: No If all of the above answers are "NO", then may proceed with Cephalosporin use.    Duloxetine Other (See Comments)    GI upset   Tramadol Other (See Comments)    Nervousness, causes hands to shake   Aspirin Nausea Only   Codeine Itching   Hydrocodone Nausea Only and Other (See Comments)    Nose bleed   Metformin And Related Diarrhea and Nausea Only   Oxycodone Nausea And Vomiting    GI Upset (intolerance)   Victoza [Liraglutide] Other (See Comments)    Abdominal pain  Medications Reviewed Today     Reviewed by Darius Bump, [facility] (Pharmacist) on 06/15/21 at 1009  Med List Status: <None>   Medication Order Taking? Sig Documenting Provider Last Dose Status Informant  ACCU-CHEK AVIVA PLUS test strip [id number] Yes FOR TESTING BLOOD SUGARS 3 TIMES DAILY. DX:E11.9 Hali Marry, MD Taking Active Multiple Informants  Accu-Chek Softclix Lancets lancets [id number] Yes DX:E11.9 USE AS DIRECTED Hali Marry, MD Taking Active   acetaminophen (TYLENOL 8 HOUR ARTHRITIS PAIN) 650 MG CR tablet [id number] Yes Take 650 mg by mouth every 8 (eight) hours as needed for pain. [provider] Taking Active   aspirin EC 81 MG tablet [id number] Yes Take 1 tablet (81 mg total) by mouth daily. Traci Sermon, PA-C Taking Active Multiple Informants           Med Note Maryruth Eve, Leanna Battles   Thu Dec 25, 2020 10:27 AM)    atorvastatin (LIPITOR) 40 MG  tablet [id number] Yes Take 1 tablet (40 mg total) by mouth at bedtime. Hali Marry, MD Taking Active   B-D ULTRAFINE III SHORT PEN 31G X 8 MM MISC [id number] Yes USE AS DIRECTED Hali Marry, MD Taking Active Multiple Informants  Blood Glucose Monitoring Suppl (ACCU-CHEK AVIVA PLUS) w/Device KIT [id number] Yes Check blood sugars 3 times daily DX:E11.9 Hali Marry, MD Taking Active Multiple Informants  cholecalciferol (VITAMIN D) 1000 units tablet [id number] Yes Take 1,000 Units by mouth daily. [provider] Taking Active Multiple Informants  dexlansoprazole (DEXILANT) 60 MG capsule [id number] Yes Take 60 mg by mouth daily. [provider] Taking Active Multiple Informants  dicyclomine (BENTYL) 20 MG tablet [id number] Yes Take 20 mg by mouth every 6 (six) hours as needed for spasms (cramps/stomach pain). [provider] Taking Active Multiple Informants  doxepin (SINEQUAN) 10 MG capsule [id number] Yes Take 10 mg by mouth at bedtime. [provider] Taking Active Multiple Informants  Ferrous Sulfate (CVS SLOW RELEASE IRON PO) [id number] Yes Take 1 tablet by mouth daily. [provider] Taking Active   fluticasone (FLONASE) 50 MCG/ACT nasal spray [id number] Yes PLACE 1 SPRAY INTO BOTH NOSTRILS DAILY. Hali Marry, MD Taking Active   glipiZIDE (GLUCOTROL) 10 MG tablet [id number] Yes Take 1 tablet (10 mg total) by mouth 2 (two) times daily. Hali Marry, MD Taking Active   hydrochlorothiazide (HYDRODIURIL) 25 MG tablet [id number] Yes TAKE 1 TABLET (25 MG TOTAL) BY MOUTH DAILY AS NEEDED. FOR SWELLING Hali Marry, MD Taking Active   insulin glargine (LANTUS SOLOSTAR) 100 UNIT/ML Solostar Pen [id number] Yes Inject 36 Units into the skin at bedtime as needed (high blood sugar).  Patient taking differently: Inject 36 Units into the skin at bedtime as needed (high blood sugar). Patient currently using 20-30 units daily    Hali Marry, MD Taking Active   JARDIANCE 25 MG TABS tablet [id number] Yes TAKE 25 MG BY MOUTH DAILY. Hali Marry, MD Taking Active   Krill Oil 500 MG CAPS [id number] Yes Take 500 mg by mouth daily. [provider] Taking Active Multiple Informants           Med Note Arbutus Ped Jan 22, 2021  8:48 AM)    levocetirizine (XYZAL) 5 MG tablet [id number] Yes Take 1 tablet (5 mg total) by mouth every evening. Hali Marry, MD Taking Active Multiple Informants  Multiple Vitamin (MULTIVITAMIN WITH MINERALS) TABS tablet [id number] Yes Take 1 tablet by mouth daily. [provider] Taking Active Multiple Informants           Med Note Arbutus Ped Jan 22, 2021  8:48 AM)    OVER THE COUNTER MEDICATION [id number] Yes Take 1 tablet by mouth 3 (three) times daily. Golo otc weight loss supplement [provider] Taking Active Multiple Informants           Med Note Arbutus Ped Jan 22, 2021  8:48 AM)    perphenazine (TRILAFON) 8 MG tablet [id number] Yes Take 8 mg by mouth at bedtime. [provider] Taking Active Multiple Informants  pregabalin (LYRICA) 200 MG capsule [id number] Yes TAKE 1 CAPSULE (200 MG TOTAL) BY MOUTH 3 (THREE) TIMES DAILY. Marcial Pacas, MD Taking Active   sucralfate (CARAFATE) 1 g tablet [id number] Yes Take 1 tablet (1 g total) by mouth 4 (four) times daily -  with meals and at bedtime. Hali Marry, MD Taking Active             Patient Active Problem List   Diagnosis Date Noted   Left hip pain 02/23/2021   Low back pain 02/23/2021   Gait abnormality 02/23/2021   Bilateral lower extremity edema 01/22/2021   Rhabdomyolysis 10/14/2020   Fall 10/12/2020   Elevated CK 10/12/2020   Spinal stenosis 10/09/2020   Lumbar adjacent segment disease with spondylolisthesis 10/09/2020   Elevated blood-pressure reading, without diagnosis of hypertension 05/06/2020   Spinal stenosis, lumbar region  with neurogenic claudication 04/16/2020   Peripheral neuropathy 11/28/2019   Paresthesia and pain of both upper extremities 11/12/2019   Chronic midline low back pain with bilateral sciatica 10/30/2019   Left thigh pain 10/30/2019   Numbness 10/30/2019   Meralgia paresthetica, left 09/12/2019   T[place] Must Quervain's disease (tenosynovitis) 02/06/2019   Acute bilateral low back pain without sciatica 12/01/2018   Orthostatic hypotension 12/01/2018   Failed back syndrome of lumbar spine 11/25/2017   Inflammation of sacroiliac joint ([place]) 06/07/2017   Displacement of lumbar intervertebral disc 11/22/2016   Fatty liver 09/27/2016   Status post bilateral breast reduction 03/23/2016   Hypertrophy of breast 08/12/2015   Spondylolisthesis of lumbar region 06/13/2015   History of lumbar fusion 04/07/2015   Radiculopathy, lumbar region 04/07/2015   Essential hypertension 02/14/2015   Postprandial vomiting 02/04/2014   Obesity (BMI 30-39.9) 11/01/2013   Carotid artery stenosis 06/07/2013   Hereditary and idiopathic peripheral neuropathy 06/07/2013   History of migraine 05/04/2013   Type 2 diabetes mellitus with other specified complication ([place]) 27/74/1287   SVT (supraventricular tachycardia) ([place]) 11/03/2010   GERD (gastroesophageal reflux disease) 10/29/2010   Chronic low back pain 10/29/2010   Hyperlipidemia associated with type 2 diabetes mellitus ([place]) 10/29/2010    Immunization History  Administered Date(s) Administered   Fluad Quad(high Dose 65+) 12/25/2020   Influenza Split 03/02/2011, 02/25/2012   Influenza, High Dose Seasonal PF 06/06/2017, 12/25/2017, 12/19/2018, 12/13/2019   Influenza,inj,Quad PF,6+ Mos 02/13/2015, 12/23/2015, 12/22/2016   Influenza-Unspecified 01/24/2013, 01/21/2014, 02/26/2015   Moderna Sars-Covid-2 Vaccination 06/08/2019, 07/06/2019   PFIZER(Purple Top)SARS-COV-2 Vaccination 03/25/2020   PNEUMOCOCCAL CONJUGATE-20 02/05/2021   PPD Test 02/16/2017   Pneumococcal  Conjugate-13 02/14/2015   Pneumococcal Polysaccharide-23 05/12/2005, 01/17/2018, 07/15/2020   Td 07/07/2019   Tdap 04/27/2007, 07/30/2017, 11/16/2019   Zoster Recombinat (Shingrix) 04/01/2017, 08/10/2017    Conditions to be addressed/monitored: HTN, HLD, and DMII  Care Plan : Medication Management  Updates made by Darius Bump, [place] since 06/16/2021 12:00 AM  Problem: DM, HTN, HLD      Long-Range Goal: Disease Progression Prevention   Start Date: 06/15/2021  This Visit's Progress: On track  Priority: High  Note:   Current Barriers:  Suboptimal therapeutic regimen for diabetes  Pharmacist Clinical Goal(s):  Over the next 90 days, patient will adhere to plan to optimize therapeutic regimen for diabetes as evidenced by report of adherence to recommended medication management changes through collaboration with PharmD and provider.   Interventions: 1:1 collaboration with Hali Marry, MD regarding development and update of comprehensive plan of care as evidenced by provider attestation and co-signature Inter-disciplinary care team collaboration (see longitudinal plan of care) Comprehensive medication review performed; medication list updated in electronic medical record  Diabetes:   Controlled; current treatment:lantus 22-28 units, glipizide 43m BID, jardiance 24mdaily; a1c 7  Current glucose readings: checking AM, 11am, and 3-4PM.  fasting glucose: 155, post prandial glucose: 71-138  Denies hypoglycemic/hyperglycemic symptoms  Current meal patterns: eats breakfast, skips lunch, then 3-4PM for supper  breakfast: crackers, or sausage egg cheese biscuit; lunch: skips; dinner: mashed potatoes, baked beans, tuKuwaitsandwich; snacks: protein shake, ; drinks: pineapple peach fruit drink, diet ginger ale, water  Current exercise: ambulates throughout the day  Educated on goals of diabetes care, glucose values and a1c, as well as mechanism of action of various medicines  used to control glucose. Counseled on ozempic dosing, administration technique, and benefits of switching to GLP1 therapy Recommended initiate ozempic 0.2550meekly, patient requested her novonordisk application be mailed to her, and then can begin ozempic 0.3m49mekly using medication sample at next PCP visit.   Hypertension:  Controlled; current treatment: hydrochlorothiazide 3mg58mly;   Current home readings: not currently checking  Denies hypotensive/hypertensive symptoms  Recommended continue current regimen, Hyperlipidemia:  Controlled; current treatment:atorvastatin 40mg 48my; LDL 75  Recommended continue current regimen   Patient Goals/Self-Care Activities Over the next 90 days, patient will:  take medications as prescribed, check glucose daily, document, and provide at future appointments, and collaborate with provider on medication access solutions  Follow Up Plan: Telephone follow up appointment with care management team member scheduled for:  3-5 months      Medication Assistance: Application for novonordisk  medication assistance program. in process.  Anticipated assistance start date TBD.  See plan of care for additional detail.  Patient's preferred pharmacy [facility]:  [id number] [phone] box? Yes Pt endorses 80% compliance  Follow Up:  Patient agrees to Care Plan and Follow-up.  Plan: Telephone follow up appointment with care management team member scheduled for:  3-5 months  Lyrick Lagrand Larinda ButteryD Clinical Pharmacist [phone]

## 2021-06-16 NOTE — Patient Instructions (Signed)
Visit Information   Thank you for taking time to visit with me today. Please don't hesitate to contact me if I can be of assistance to you before our next scheduled telephone appointment.  Following are the goals we discussed today:  Patient Goals/Self-Care Activities Over the next 90 days, patient will:  take medications as prescribed, check glucose daily, document, and provide at future appointments, and collaborate with provider on medication access solutions  Follow Up Plan: Telephone follow up appointment with care management team member scheduled for:  3-5 months   Please call the care guide team at [phone] if you need to cancel or reschedule your appointment.    Following is a copy of your full care plan:  Care Plan : Medication Management  Updates made by Darius Bump, RPH since 06/16/2021 12:00 AM     Problem: DM, HTN, HLD      Long-Range Goal: Disease Progression Prevention   Start Date: 06/15/2021  This Visit's Progress: On track  Priority: High  Note:   Current Barriers:  Suboptimal therapeutic regimen for diabetes  Pharmacist Clinical Goal(s):  Over the next 90 days, patient will adhere to plan to optimize therapeutic regimen for diabetes as evidenced by report of adherence to recommended medication management changes through collaboration with PharmD and provider.   Interventions: 1:1 collaboration with Hali Marry, MD regarding development and update of comprehensive plan of care as evidenced by provider attestation and co-signature Inter-disciplinary care team collaboration (see longitudinal plan of care) Comprehensive medication review performed; medication list updated in electronic medical record  Diabetes:   Controlled; current treatment:lantus 22-28 units, glipizide 56m BID, jardiance 268mdaily; a1c 7  Current glucose readings: checking AM, 11am, and 3-4PM.  fasting glucose: 155, post prandial glucose: 71-138  Denies  hypoglycemic/hyperglycemic symptoms  Current meal patterns: eats breakfast, skips lunch, then 3-4PM for supper  breakfast: crackers, or sausage egg cheese biscuit; lunch: skips; dinner: mashed potatoes, baked beans, tuKuwaitsandwich; snacks: protein shake, ; drinks: pineapple peach fruit drink, diet ginger ale, water  Current exercise: ambulates throughout the day  Educated on goals of diabetes care, glucose values and a1c, as well as mechanism of action of various medicines used to control glucose. Counseled on ozempic dosing, administration technique, and benefits of switching to GLP1 therapy Recommended initiate ozempic 0.2510meekly, patient requested her novonordisk application be mailed to her, and then can begin ozempic 0.10m59mekly using medication sample at next PCP visit.   Hypertension:  Controlled; current treatment: hydrochlorothiazide 10mg20mly;   Current home readings: not currently checking  Denies hypotensive/hypertensive symptoms  Recommended continue current regimen, Hyperlipidemia:  Controlled; current treatment:atorvastatin 40mg 47my; LDL 75  Recommended continue current regimen   Patient Goals/Self-Care Activities Over the next 90 days, patient will:  take medications as prescribed, check glucose daily, document, and provide at future appointments, and collaborate with provider on medication access solutions  Follow Up Plan: Telephone follow up appointment with care management team member scheduled for:  3-5 months      Consent to CCM Services: Ms. WilliaLowdermilkiven information about Chronic Care Management services including:  CCM service includes personalized support from designated clinical staff supervised by her physician, including individualized plan of care and coordination with other care providers 24/7 contact phone numbers for assistance for urgent and routine care needs. Service will only be billed when office clinical staff spend 20 minutes or more in  a month to coordinate care. Only one practitioner may furnish and  bill the service in a calendar month. The patient may stop CCM services at any time (effective at the end of the month) by phone call to the office staff. The patient will be responsible for cost sharing (co-pay) of up to 20% of the service fee (after annual deductible is met).  Patient agreed to services and verbal consent obtained.   The patient verbalized understanding of instructions, educational materials, and care plan provided today and agreed to receive a mailed copy of patient instructions, educational materials, and care plan.

## 2021-06-23 DIAGNOSIS — E785 Hyperlipidemia, unspecified: Secondary | ICD-10-CM

## 2021-06-23 DIAGNOSIS — Z794 Long term (current) use of insulin: Secondary | ICD-10-CM

## 2021-06-23 DIAGNOSIS — I10 Essential (primary) hypertension: Secondary | ICD-10-CM

## 2021-06-23 DIAGNOSIS — E1169 Type 2 diabetes mellitus with other specified complication: Secondary | ICD-10-CM | POA: Diagnosis not present

## 2021-06-26 ENCOUNTER — Other Ambulatory Visit: Payer: Self-pay

## 2021-06-26 ENCOUNTER — Encounter: Payer: Self-pay | Admitting: Podiatry

## 2021-06-26 ENCOUNTER — Ambulatory Visit (INDEPENDENT_AMBULATORY_CARE_PROVIDER_SITE_OTHER): Payer: Medicare Other | Admitting: Podiatry

## 2021-06-26 DIAGNOSIS — E1169 Type 2 diabetes mellitus with other specified complication: Secondary | ICD-10-CM | POA: Diagnosis not present

## 2021-06-26 DIAGNOSIS — M79675 Pain in left toe(s): Secondary | ICD-10-CM | POA: Diagnosis not present

## 2021-06-26 DIAGNOSIS — L84 Corns and callosities: Secondary | ICD-10-CM

## 2021-06-26 DIAGNOSIS — M79674 Pain in right toe(s): Secondary | ICD-10-CM | POA: Diagnosis not present

## 2021-06-26 DIAGNOSIS — B351 Tinea unguium: Secondary | ICD-10-CM | POA: Diagnosis not present
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
## 2021-06-29 ENCOUNTER — Other Ambulatory Visit: Payer: Self-pay | Admitting: Family Medicine

## 2021-06-29 DIAGNOSIS — R600 Localized edema: Secondary | ICD-10-CM

## 2021-06-30 ENCOUNTER — Telehealth: Payer: Self-pay | Admitting: Podiatry

## 2021-06-30 NOTE — Telephone Encounter (Signed)
Patient wants to know if you will prescribe her something for the neuropathy pain in her feet , [facility] uses the [facility] [address] in [place] [facility]  Please advise

## 2021-07-01 NOTE — Telephone Encounter (Signed)
Called patient back and referred her to call her primary care doctor.  Patient wants to know if she stops taking the Lyrica can she get regular pain medication for the pain?  Patient states she no longers see Dr Krista Blue, but she will call her PCP and see if they will write her something different.

## 2021-07-01 NOTE — Telephone Encounter (Signed)
Followed up with patient - advised patient not to stop the Lyrica and if she is needing further pain medication she would have to see pain management , she is going to follow up with PCP .

## 2021-07-05 DIAGNOSIS — M6282 Rhabdomyolysis: Secondary | ICD-10-CM | POA: Diagnosis not present

## 2021-07-16 ENCOUNTER — Other Ambulatory Visit: Payer: Self-pay | Admitting: Family Medicine

## 2021-07-16 DIAGNOSIS — E119 Type 2 diabetes mellitus without complications: Secondary | ICD-10-CM

## 2021-07-17 ENCOUNTER — Other Ambulatory Visit: Payer: Self-pay | Admitting: *Deleted

## 2021-07-17 DIAGNOSIS — E119 Type 2 diabetes mellitus without complications: Secondary | ICD-10-CM

## 2021-07-17 MED ORDER — ACCU-CHEK GUIDE VI STRP: ORAL_STRIP | 33 refills | Status: DC

## 2021-07-21 ENCOUNTER — Encounter: Payer: Self-pay | Admitting: Family Medicine

## 2021-07-21 ENCOUNTER — Ambulatory Visit (INDEPENDENT_AMBULATORY_CARE_PROVIDER_SITE_OTHER): Payer: Medicare Other | Admitting: Family Medicine

## 2021-07-21 ENCOUNTER — Other Ambulatory Visit: Payer: Self-pay

## 2021-07-21 VITALS — BP 104/56 | HR 84 | Ht 64.0 in | Wt 213.0 lb

## 2021-07-21 DIAGNOSIS — M961 Postlaminectomy syndrome, not elsewhere classified: Secondary | ICD-10-CM

## 2021-07-21 DIAGNOSIS — E1169 Type 2 diabetes mellitus with other specified complication: Secondary | ICD-10-CM

## 2021-07-21 DIAGNOSIS — Z794 Long term (current) use of insulin: Secondary | ICD-10-CM

## 2021-07-21 DIAGNOSIS — R790 Abnormal level of blood mineral: Secondary | ICD-10-CM | POA: Diagnosis not present

## 2021-07-21 DIAGNOSIS — G6289 Other specified polyneuropathies: Secondary | ICD-10-CM

## 2021-07-21 DIAGNOSIS — I10 Essential (primary) hypertension: Secondary | ICD-10-CM | POA: Diagnosis not present

## 2021-07-21 DIAGNOSIS — R0789 Other chest pain: Secondary | ICD-10-CM

## 2021-07-21 LAB — POCT GLYCOSYLATED HEMOGLOBIN (HGB A1C): Hemoglobin A1C: 6.9 % — AB (ref 4.0–5.6)

## 2021-07-21 MED ORDER — PREGABALIN 25 MG PO CAPS: ORAL_CAPSULE | ORAL | 0 refills | Status: DC

## 2021-07-21 NOTE — Patient Instructions (Signed)
Decrease your Lyrica to 200 mg twice a day.  You can do this for 1 week and then you will start the lower dose Lyrica just follow the instructions on the bottle. ?

## 2021-07-21 NOTE — Assessment & Plan Note (Signed)
We will taper off of the Lyrica over the next month.  Her next follow-up with podiatry is in about 3 months.  She says she thinks there is also a second medication that might be contraindicated but again we can start with the Lyrica and see how she does as we are tapering down if she notices a increase in her pain we can always decide if we want to keep the Lyrica or not. ?

## 2021-07-21 NOTE — Progress Notes (Signed)
? ?Established Patient Office Visit ? ?Subjective:  ?Patient ID: Amanda Morris, female    DOB: 30-Apr-1950  Age: 71 y.o. MRN: [id number] ? ?CC:  ?Chief Complaint  ?Patient presents with  ? Diabetes  ? ? ?HPI ?Amanda Morris presents for  ? ?Diabetes - no hypoglycemic events. No wounds or sores that are not healing well. No increased thirst or urination. Checking glucose at home. Taking medications as prescribed without any side effects.  Now seeing podiatry for nail care.  She had met with our clinical pharmacist as discussed possibly getting assistance for Ozempic in hopes that we could switch her to [place] and be able to take her off of the glipizide and hopefully reduce her insulin.  We increased her insulin just slightly at last office visit.sing 26-28 units.   ? ?Hypertension- Pt denies chest pain, SOB, dizziness, or heart palpitations.  Taking meds as directed w/o problems.  Denies medication side effects.   ? ?F/U peripheral neuropathy -  her podiatrist wants to put on her something but can't with the Lyrica.  He is not convinced that the Lyrica is actually helping her with her neuropathy pain.  Reviewed note from podiatry.  Not clear what medications that she is wanting to consider but we can absolutely taper off of the Lyrica. ? ?Colonoscopy scheduled for next week.  ? ?She is also having of the consultation for possible repeat back surgery unfortunately the screws in her hardware are shifting. ? ?Also complains of intermittent chest pain that started recently she had never really had it before she describes it as sharp.  She says it seems to be random and can happen at rest or with activity.  It just usually lasts a few seconds when it occurs.  She does not feel like it is related to her GI symptoms. ? ?Past Medical History:  ?Diagnosis Date  ? Allergy   ? Diabetes mellitus without complication ([place])   ? type 2  ? GERD (gastroesophageal reflux disease)   ? Headache   ? History of hiatal  hernia   ? Hypercholesterolemia   ? Numbness   ? left, outer thigh  ? Palpitations   ? in the past, no current issues per patient  ? Postlaminectomy syndrome   ? ? ?Past Surgical History:  ?Procedure Laterality Date  ? ABDOMINAL HYSTERECTOMY  1982  ? BACK SURGERY    ? x 2   ? BREAST REDUCTION SURGERY Bilateral 03/17/2016  ? Procedure: MAMMARY REDUCTION  (BREAST);  Surgeon: Wallace Going, DO;  Location: [facility];  Service: Plastics;  Laterality: Bilateral;  ? BREAST SURGERY    ? COLONOSCOPY    ? LUMBAR WOUND DEBRIDEMENT N/A 05/22/2020  ? Procedure: Incision and drainage of lumbar wound;  Surgeon: Ashok Pall, MD;  Location: [facility];  Service: Neurosurgery;  Laterality: N/A;  ? SHOULDER SURGERY  06/2008, 09/2010  ? Right, by Dr. Karie Soda, then Dr. Judeth Horn  ? SPINAL CORD STIMULATOR INSERTION N/A 12/02/2017  ? Procedure: LUMBAR SPINAL CORD STIMULATOR INSERTION;  Surgeon: Ashok Pall, MD;  Location: [facility];  Service: Neurosurgery;  Laterality: N/A;  ? SPINAL CORD STIMULATOR TRIAL N/A 11/25/2017  ? Procedure: INSERTION LUMBAR SPINAL CORD STIMULATOR TRIAL  ;  Surgeon: Ashok Pall, MD;  Location: [facility];  Service: Neurosurgery;  Laterality: N/A;   INSERTION LUMBAR SPINAL CORD STIMULATOR TRIAL  ?  ? UPPER GI ENDOSCOPY    ? ? ?Family History  ?Problem Relation Age of Onset  ?  Heart disease Mother   ? Diabetes Mother   ? Hypertension Mother   ? Stroke Mother   ? Heart disease Father   ? Heart disease Sister   ? Diabetes Sister   ? Hyperlipidemia Sister   ? Hypertension Sister   ? Stroke Sister   ? Alcohol abuse Brother   ? Hyperlipidemia Brother   ? ? ?Social History  ? ?Socioeconomic History  ? Marital status: Divorced  ?  Spouse name: Not on file  ? Number of children: 1  ? Years of education: 10th grade  ? Highest education level: 10th grade  ?Occupational History  ? Occupation: Disabled.   ?  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 Smoking status: Former  ?  Types: Cigarettes  ?   Quit date: 07/30/2007  ?  Years since quitting: 13.9  ? Smokeless tobacco: Never  ?Vaping Use  ? Vaping Use: Never used  ?Substance and Sexual Activity  ? Alcohol use: No  ? Drug use: No  ? Sexual activity: Not Currently  ?  Birth control/protection: Surgical  ?  Comment: Hysterectomy  ?Other Topics Concern  ? Not on file  ?Social History Narrative  ? Lives alone. She had one child, who passed away this year. She enjoys spending time with her nieces and nephews and she also enjoys reading there bible.  ? ?Social Determinants of Health  ? ?Financial Resource Strain: Low Risk   ? Difficulty of Paying Living Expenses: Not hard at all  ?Food Insecurity: No Food Insecurity  ? Worried About Charity fundraiser in the Last Year: Never true  ? Ran Out of Food in the Last Year: Never true  ?Transportation Needs: No Transportation Needs  ? Lack of Transportation (Medical): No  ? Lack of Transportation (Non-Medical): No  ?Physical Activity: Inactive  ? Days of Exercise per Week: 0 days  ? Minutes of Exercise per Session: 0 min  ?Stress: No Stress Concern Present  ? Feeling of Stress : Not at all  ?Social Connections: Moderately Isolated  ? Frequency of Communication with Friends and Family: More than three times a week  ? Frequency of Social Gatherings with Friends and Family: Three times a week  ? Attends Religious Services: More than 4 times per year  ? Active Member of Clubs or Organizations: No  ? Attends Archivist Meetings: Never  ? Marital Status: Divorced  ?Intimate Partner Violence: Not At Risk  ? Fear of Current or Ex-Partner: No  ? Emotionally Abused: No  ? Physically Abused: No  ? Sexually Abused: No  ? ? ?Outpatient Medications Prior to Visit  ?Medication Sig Dispense Refill  ? Accu-Chek Softclix Lancets lancets DX:E11.9 USE AS DIRECTED 100 each PRN  ? acetaminophen (TYLENOL) 650 MG CR tablet Take 650 mg by mouth every 8 (eight) hours as needed for pain.    ? aspirin EC 81 MG tablet Take 1 tablet (81 mg  total) by mouth daily.    ? atorvastatin (LIPITOR) 40 MG tablet Take 1 tablet (40 mg total) by mouth at bedtime. 90 tablet 3  ? B-D ULTRAFINE III SHORT PEN 31G X 8 MM MISC USE AS DIRECTED 100 each 6  ? Blood Glucose Monitoring Suppl (ACCU-CHEK AVIVA PLUS) w/Device KIT Check blood sugars 3 times daily DX:E11.9 1 kit prn  ? cholecalciferol (VITAMIN D) 1000 units tablet Take 1,000 Units by mouth daily.    ? dexlansoprazole (DEXILANT) 60 MG capsule Take 60 mg by  mouth daily.    ? dicyclomine (BENTYL) 20 MG tablet Take 20 mg by mouth every 6 (six) hours as needed for spasms (cramps/stomach pain).    ? doxepin (SINEQUAN) 10 MG capsule Take 10 mg by mouth at bedtime.    ? Ferrous Sulfate ([facility] SLOW RELEASE IRON PO) Take 1 tablet by mouth daily.    ? fluticasone (FLONASE) 50 MCG/ACT nasal spray PLACE 1 SPRAY INTO BOTH NOSTRILS DAILY. 48 mL 4  ? glipiZIDE (GLUCOTROL) 10 MG tablet Take 1 tablet (10 mg total) by mouth 2 (two) times daily. 180 tablet 0  ? glucose blood (ACCU-CHEK GUIDE) test strip CHECK BLOOD SUGAR 3 TIMES DAILY. DX: E11.9 300 strip 33  ? hydrochlorothiazide (HYDRODIURIL) 25 MG tablet TAKE 1 TABLET (25 MG TOTAL) BY MOUTH DAILY AS NEEDED. FOR SWELLING 30 tablet 1  ? insulin glargine (LANTUS SOLOSTAR) 100 UNIT/ML Solostar Pen Inject 36 Units into the skin at bedtime as needed (high blood sugar). (Patient taking differently: Inject 36 Units into the skin at bedtime as needed (high blood sugar). Patient currently using 20-30 units daily) 15 mL 6  ? JARDIANCE 25 MG TABS tablet TAKE 25 MG BY MOUTH DAILY. 90 tablet 1  ? Krill Oil 500 MG CAPS Take 500 mg by mouth daily.    ? levocetirizine (XYZAL) 5 MG tablet Take 1 tablet (5 mg total) by mouth every evening. 90 tablet 3  ? Multiple Vitamin (MULTIVITAMIN WITH MINERALS) TABS tablet Take 1 tablet by mouth daily.    ? OVER THE COUNTER MEDICATION Take 1 tablet by mouth 3 (three) times daily. Golo otc weight loss supplement    ? perphenazine (TRILAFON) 8 MG tablet Take 8 mg  by mouth at bedtime.  2  ? sucralfate (CARAFATE) 1 g tablet Take 1 tablet (1 g total) by mouth 4 (four) times daily -  with meals and at bedtime. 120 tablet 1  ? pregabalin (LYRICA) 200 MG capsule TAKE 1 CAPS

## 2021-07-21 NOTE — Assessment & Plan Note (Signed)
Well controlled. Continue current regimen. Follow up in  6 mo  

## 2021-07-21 NOTE — Assessment & Plan Note (Signed)
A1c is 6.9 today which is fantastic she is now just under 7.  She did bring in her home glucose log.  Most of her morning glucose levels look great most of them are below 120.  But she does have a few lows mixed in as well.  Looks like per the notes she did have an over the phone consultation with our clinical pharmacist about a GLP-1, but she does not remember the encounter and says she never received an application.  We will check into this and see I do think she would be a great candidate for a GLP-1 though with her current GI symptoms and increased nausea we may want to hold off until after she has had a full GI work-up.  She has a colonoscopy scheduled for next week. ?

## 2021-07-21 NOTE — Assessment & Plan Note (Signed)
Has consultation with a new surgeon to discuss possible repeat surgery. ?

## 2021-07-22 LAB — BASIC METABOLIC PANEL WITH GFR
BUN: 9 mg/dL (ref 7–25)
CO2: 33 mmol/L — ABNORMAL HIGH (ref 20–32)
Calcium: 9.3 mg/dL (ref 8.6–10.4)
Chloride: 100 mmol/L (ref 98–110)
Creat: 0.8 mg/dL (ref 0.50–1.05)
Glucose, Bld: 66 mg/dL (ref 65–99)
Potassium: 3 mmol/L — ABNORMAL LOW (ref 3.5–5.3)
Sodium: 143 mmol/L (ref 135–146)
eGFR: 80 mL/min/{1.73_m2} (ref >=60)

## 2021-07-22 LAB — LIPID PANEL W/REFLEX DIRECT LDL
Cholesterol: 120 mg/dL (ref <200)
HDL: 45 mg/dL — ABNORMAL LOW (ref >=50)
LDL Cholesterol (Calc): 58 mg/dL (calc)
Non-HDL Cholesterol (Calc): 75 mg/dL (calc) (ref <130)
Total CHOL/HDL Ratio: 2.7 (calc) (ref <5.0)
Triglycerides: 91 mg/dL (ref <150)

## 2021-07-22 LAB — IRON,TIBC AND FERRITIN PANEL
%SAT: 20 % (calc) (ref 16–45)
Ferritin: 70 ng/mL (ref 16–288)
Iron: 74 ug/dL (ref 45–160)
TIBC: 370 mcg/dL (calc) (ref 250–450)

## 2021-07-24 NOTE — Progress Notes (Signed)
Cal pt: Her potassium is low.  Please cut your hydrochlorothiazide in half and take half a tab daily and try to eat more potassium rich foods and plan to recheck in 2 weeks.  ?Cholesterol looks good. Iron looks good.

## 2021-07-29 DIAGNOSIS — D509 Iron deficiency anemia, unspecified: Secondary | ICD-10-CM | POA: Diagnosis not present

## 2021-07-29 DIAGNOSIS — D123 Benign neoplasm of transverse colon: Secondary | ICD-10-CM | POA: Diagnosis not present

## 2021-07-29 DIAGNOSIS — K3189 Other diseases of stomach and duodenum: Secondary | ICD-10-CM | POA: Diagnosis not present

## 2021-07-29 DIAGNOSIS — R1013 Epigastric pain: Secondary | ICD-10-CM | POA: Diagnosis not present

## 2021-07-29 DIAGNOSIS — D122 Benign neoplasm of ascending colon: Secondary | ICD-10-CM | POA: Diagnosis not present

## 2021-07-29 LAB — HM COLONOSCOPY

## 2021-07-30 DIAGNOSIS — R079 Chest pain, unspecified: Secondary | ICD-10-CM | POA: Diagnosis not present

## 2021-07-30 DIAGNOSIS — R918 Other nonspecific abnormal finding of lung field: Secondary | ICD-10-CM | POA: Diagnosis not present

## 2021-07-30 DIAGNOSIS — I10 Essential (primary) hypertension: Secondary | ICD-10-CM | POA: Diagnosis not present

## 2021-07-30 DIAGNOSIS — E785 Hyperlipidemia, unspecified: Secondary | ICD-10-CM | POA: Diagnosis not present

## 2021-08-01 ENCOUNTER — Other Ambulatory Visit: Payer: Self-pay | Admitting: Family Medicine

## 2021-08-01 DIAGNOSIS — K2100 Gastro-esophageal reflux disease with esophagitis, without bleeding: Secondary | ICD-10-CM

## 2021-08-05 DIAGNOSIS — M6282 Rhabdomyolysis: Secondary | ICD-10-CM | POA: Diagnosis not present

## 2021-08-06 ENCOUNTER — Telehealth: Payer: Self-pay

## 2021-08-06 NOTE — Progress Notes (Signed)
[facility])  ?                                          [facility] Quality Pharmacy Team  ?                                      Statin Quality Measure Assessment ?  ? ?08/06/2021 ? ?[name] ?02/25/1951 ?[id number] ? ?Per review of chart and payor information, this patient has been flagged for non-adherence to the following CMS Quality Measure:  ? '[x]'$  Statin Use in Persons with Diabetes ? '[]'$  Statin Use in Persons with Cardiovascular Disease ? ?Atorvastatin 40 mg daily on file and was last sold on 11/9/222 for 90 days supply. However, pravastatin was also sold on 03/06/2021. I called patient regarding fill history and to clarify current statin medication she is taking. Ms. Yazdi reported that she stopped taking pravastatin and started atorvastatin in November or December of last year. She continues to take atorvastatin but noted "they are weaning me off atorvastatin d/t neuropathy". For this reason, patient has not filled atorvastatin. Per discussion with patient, it was unclear whether the atorvastatin caused or worsened neuropathy. If deemed clinically appropriate, please consider exclusion code (at the next office visit) and/or assessment of statin alternative. ? ?Next appointment with PCP is 10/20/2021. ? ? ?Please consider ONE of the following recommendations:  ? ?Initiate high intensity statin Atorvastatin '40mg'$  once daily, #90, 3 refills  ? Rosuvastatin '20mg'$  once daily, #90, 3 refills  ?  ?Initiate moderate intensity  ?        statin with reduced frequency if prior  ?        statin intolerance 1x weekly, #13, 3 refills  ? 2x weekly, #26, 3 refills  ? 3x weekly, #39, 3 refills  ? ?Code for past statin intolerance or other exclusions (required annually) ? Drug Induced Myopathy G72.0  ? 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 Rhabdomyolysis M62.82  ? Cirrhosis of liver K74.69  ? Biliary cirrhosis, unspecified K74.5  ? Abnormal blood glucose - for SUPD ONLY  R73.09  ? Prediabetes - for SUPD ONLY  R73.03  ? Polycystic ovarian syndrome E28.2  ? Adverse effect of antihyperlipidemic and antiarteriosclerotic drugs, initial encounter T46.6X5A  ? ?Thank you for your time, ? ?Kristeen Miss, PharmD ?Clinical Pharmacist ?[place] ?Cell: [phone]   ?

## 2021-08-13 DIAGNOSIS — M4316 Spondylolisthesis, lumbar region: Secondary | ICD-10-CM | POA: Diagnosis not present

## 2021-08-13 DIAGNOSIS — M5135 Other intervertebral disc degeneration, thoracolumbar region: Secondary | ICD-10-CM | POA: Diagnosis not present

## 2021-08-13 DIAGNOSIS — M549 Dorsalgia, unspecified: Secondary | ICD-10-CM | POA: Diagnosis not present

## 2021-08-13 DIAGNOSIS — S39012A Strain of muscle, fascia and tendon of lower back, initial encounter: Secondary | ICD-10-CM | POA: Diagnosis not present

## 2021-08-13 DIAGNOSIS — M47816 Spondylosis without myelopathy or radiculopathy, lumbar region: Secondary | ICD-10-CM | POA: Diagnosis not present

## 2021-08-13 DIAGNOSIS — M419 Scoliosis, unspecified: Secondary | ICD-10-CM | POA: Diagnosis not present

## 2021-08-13 DIAGNOSIS — M4319 Spondylolisthesis, multiple sites in spine: Secondary | ICD-10-CM | POA: Diagnosis not present

## 2021-08-13 DIAGNOSIS — Z981 Arthrodesis status: Secondary | ICD-10-CM | POA: Diagnosis not present

## 2021-08-18 ENCOUNTER — Other Ambulatory Visit: Payer: Self-pay | Admitting: Family Medicine

## 2021-08-18 ENCOUNTER — Other Ambulatory Visit: Payer: Self-pay | Admitting: Physician Assistant

## 2021-08-18 DIAGNOSIS — E119 Type 2 diabetes mellitus without complications: Secondary | ICD-10-CM

## 2021-08-18 DIAGNOSIS — I10 Essential (primary) hypertension: Secondary | ICD-10-CM

## 2021-09-03 DIAGNOSIS — I10 Essential (primary) hypertension: Secondary | ICD-10-CM | POA: Diagnosis not present

## 2021-09-03 DIAGNOSIS — R079 Chest pain, unspecified: Secondary | ICD-10-CM | POA: Diagnosis not present

## 2021-09-03 DIAGNOSIS — E785 Hyperlipidemia, unspecified: Secondary | ICD-10-CM | POA: Diagnosis not present

## 2021-09-04 ENCOUNTER — Encounter: Payer: Self-pay | Admitting: Family Medicine

## 2021-09-05 ENCOUNTER — Other Ambulatory Visit: Payer: Self-pay | Admitting: Family Medicine

## 2021-09-05 ENCOUNTER — Other Ambulatory Visit: Payer: Self-pay | Admitting: Physician Assistant

## 2021-09-05 DIAGNOSIS — I10 Essential (primary) hypertension: Secondary | ICD-10-CM

## 2021-09-05 DIAGNOSIS — E119 Type 2 diabetes mellitus without complications: Secondary | ICD-10-CM

## 2021-09-16 DIAGNOSIS — R079 Chest pain, unspecified: Secondary | ICD-10-CM | POA: Diagnosis not present

## 2021-09-16 DIAGNOSIS — E785 Hyperlipidemia, unspecified: Secondary | ICD-10-CM | POA: Diagnosis not present

## 2021-09-16 DIAGNOSIS — I10 Essential (primary) hypertension: Secondary | ICD-10-CM | POA: Diagnosis not present

## 2021-09-22 ENCOUNTER — Other Ambulatory Visit: Payer: Self-pay | Admitting: Physician Assistant

## 2021-09-22 DIAGNOSIS — E119 Type 2 diabetes mellitus without complications: Secondary | ICD-10-CM

## 2021-09-22 DIAGNOSIS — I10 Essential (primary) hypertension: Secondary | ICD-10-CM

## 2021-09-29 ENCOUNTER — Other Ambulatory Visit: Payer: Self-pay | Admitting: Physician Assistant

## 2021-09-29 DIAGNOSIS — E119 Type 2 diabetes mellitus without complications: Secondary | ICD-10-CM

## 2021-09-29 DIAGNOSIS — I10 Essential (primary) hypertension: Secondary | ICD-10-CM

## 2021-10-01 ENCOUNTER — Encounter: Payer: Self-pay | Admitting: Podiatry

## 2021-10-01 ENCOUNTER — Ambulatory Visit (INDEPENDENT_AMBULATORY_CARE_PROVIDER_SITE_OTHER): Payer: Medicare Other | Admitting: Podiatry

## 2021-10-01 DIAGNOSIS — M79675 Pain in left toe(s): Secondary | ICD-10-CM | POA: Diagnosis not present

## 2021-10-01 DIAGNOSIS — B351 Tinea unguium: Secondary | ICD-10-CM

## 2021-10-01 DIAGNOSIS — M79674 Pain in right toe(s): Secondary | ICD-10-CM

## 2021-10-01 DIAGNOSIS — E1169 Type 2 diabetes mellitus with other specified complication: Secondary | ICD-10-CM

## 2021-10-01 DIAGNOSIS — L84 Corns and callosities: Secondary | ICD-10-CM
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
## 2021-10-09 ENCOUNTER — Ambulatory Visit (INDEPENDENT_AMBULATORY_CARE_PROVIDER_SITE_OTHER): Payer: Medicare Other | Admitting: Pharmacist

## 2021-10-09 DIAGNOSIS — E1169 Type 2 diabetes mellitus with other specified complication: Secondary | ICD-10-CM

## 2021-10-09 DIAGNOSIS — I10 Essential (primary) hypertension: Secondary | ICD-10-CM

## 2021-10-09 NOTE — Progress Notes (Signed)
Chronic Care Management Pharmacy Note  10/09/2021 Name:  Amanda Morris MRN:  [id number] DOB:  1950/10/22  Summary: addressed DM, HTN, HLD. She states she is in pain, has surgery upcoming for her back. Awaiting call for schedule date. Stomach pain still present occasionally, but she believes is related to her back pain - colonoscopy results clear.  BG checked TID at home: 95-141 fasting, 140-150s postprandial. BG sometimes as low as 54 in evenings before supper - hypoG events not too often, asymptomatic. Insulin at 26 units nightly before bed.   Recommendations/Changes made from today's visit:  - Educated on goals of diabetes care, glucose values and a1c, as well as mechanism of action of various medicines used to control glucose. - Counseled on  hypoglycemia risks, and action plan to correct hypoglycemia. - Recommended initiate ozempic 0.35m weekly. Patient will pick up novonordisk application at upcoming PCP visit, fill out while in office.  Plan: f/u with patient in 1 month  Subjective: Amanda Gerstelis an 71y.o. year old female who is a primary patient of Metheney, CRene Kocher MD.  The CCM team was consulted for assistance with disease management and care coordination needs.    Engaged with patient by telephone for follow up visit in response to provider referral for pharmacy case management and/or care coordination services.   Consent to Services:  The patient was given the following information about Chronic Care Management services today, agreed to services, and gave verbal consent: 1. CCM service includes personalized support from designated clinical staff supervised by the primary care provider, including individualized plan of care and coordination with other care providers 2. 24/7 contact phone numbers for assistance for urgent and routine care needs. 3. Service will only be billed when office clinical staff spend 20 minutes or more in a month to coordinate  care. 4. Only one practitioner may furnish and bill the service in a calendar month. 5.The patient may stop CCM services at any time (effective at the end of the month) by phone call to the office staff. 6. The patient will be responsible for cost sharing (co-pay) of up to 20% of the service fee (after annual deductible is met). Patient agreed to services and consent obtained.  Patient Care Team: MHali Marry MD as PCP - General (Family Medicine) WEulis Canner MD (Gastroenterology) TSilverio Decamp MD as Consulting Physician (Family Medicine) [facility])  Recent office visits:  06/08/21-[name] (PCP) Seen for new referral for 2nd opinion for back surgery. Increase insulin to 28 units nightly for 1 week.  [facility]. 04/23/21-[name], MD (PCP) Seen for a diabetic follow up visit and Gastroesophageal Reflux. Ambulatory referral to Gastroenterology. Increase her Jardiance back to a whole tab daily and then decrease her insulin by 2 units. Labs ordered. Follow up in 3 months.  01/26/21-[name] (PCP) Medicare annual wellness visit. Follow up in 1 year.  01/22/21-[name] (PCP) Seen for follow up visit. Decrease glipizide to 5 mg twice daily. Labs ordered. Follow up in 3 months. 12/25/20-[name] (PCP) Seen for foot pain. Labs ordered. Flu vaccine given. Start over the counter Iron supplement. Start on Hydrochlorothiazide 25 mg. Follow up in 4 weeks.    Recent consult visits:  05/25/21-Karl A. Pleasant, PA (Gastroenterology) Seen for abdominal pain.  03/10/21-Kyle L. CChristella Noa MD (Neurosurgery) Notes not available. 02/23/21-Yijun YKrista Blue MD (Neurology) Follow  up visit.  01/26/21-Karl A. Pleasant, PA (Gastroenterology) Labs ordered. Follow up in 3 months. 12/30/20-Matthew R. Jacqualyn Posey, DPM (Podiatry) Sen for nail  problem. ABI ordered. Follow up in 3 months.   Hospital visits:  None in previous 6 months  Objective:  Lab Results  Component Value Date   CREATININE 0.80 07/21/2021   CREATININE 0.73 12/25/2020   CREATININE 0.76 11/18/2020    Lab Results  Component Value Date   HGBA1C 6.9 (A) 07/21/2021   Last diabetic Eye exam:  Lab Results  Component Value Date/Time   HMDIABEYEEXA No Retinopathy 11/02/2018 12:00 AM    Last diabetic Foot exam: No results found for: "HMDIABFOOTEX"      Component Value Date/Time   CHOL 120 07/21/2021 0000   TRIG 91 07/21/2021 0000   HDL 45 (L) 07/21/2021 0000   CHOLHDL 2.7 07/21/2021 0000   VLDL 20 10/13/2020 1759   LDLCALC 58 07/21/2021 0000       Latest Ref Rng & Units 12/25/2020   12:00 AM 10/15/2020    3:43 AM 10/14/2020    3:58 AM  Hepatic Function  Total Protein 6.1 - 8.1 g/dL 7.3  5.2  5.5   Albumin 3.5 - 5.0 g/dL  2.4  2.4   AST 10 - 35 U/L _0 ALT 6 - 29 U/L _1 Alk Phosphatase 38 - 126 U/L  52  50   Total Bilirubin 0.2 - 1.2 mg/dL 0.3  0.1  0.6     Lab Results  Component Value Date/Time   TSH 1.73 12/25/2020 12:00 AM   TSH 1.650 11/28/2019 08:54 AM       Latest Ref Rng & Units 12/25/2020   12:00 AM 11/18/2020   12:00 AM 10/15/2020    3:43 AM  CBC  WBC 3.8 - 10.8 Thousand/uL 12.9  10.2  8.2   Hemoglobin 11.7 - 15.5 g/dL 11.3  11.1  9.2   Hematocrit 35.0 - 45.0 % 39.7  38.5  29.7   Platelets 140 - 400 Thousand/uL 350  429  233     Lab Results  Component Value Date/Time   VD25OH 33.5 11/28/2019 08:54 AM    Social History   Tobacco Use  Smoking Status Former   Types: Cigarettes   Quit date: 07/30/2007   Years since quitting: 14.2  Smokeless Tobacco Never   BP Readings from Last 3 Encounters:  07/21/21 (!) 104/56  06/08/21 115/67  04/24/21 111/66   Pulse Readings from Last 3 Encounters:  07/21/21 84  06/08/21 94  04/24/21 65   Wt Readings from Last 3 Encounters:  07/21/21 213 lb (96.6 kg)   06/08/21 214 lb (97.1 kg)  04/23/21 213 lb (96.6 kg)    Assessment: Review of patient past medical history, allergies, medications, health status, including review of consultants reports, laboratory and other test data, was performed as part of comprehensive evaluation and provision of chronic care management services.   SDOH:  (Social Determinants of Health) assessments and interventions performed:    CCM Care Plan  Allergies  Allergen Reactions   Penicillins Hives and Other (See Comments)    PATIENT HAS HAD A PCN REACTION WITH IMMEDIATE RASH, FACIAL/TONGUE/THROAT SWELLING, SOB, OR LIGHTHEADEDNESS WITH HYPOTENSION:  #  #  YES  #  #  Has patient had a PCN reaction causing severe rash involving mucus membranes or skin necrosis: No Has patient had a PCN reaction that required hospitalization: No Has patient  had a PCN reaction occurring within the last 10 years: No If all of the above answers are "NO", then may proceed with Cephalosporin use.    Duloxetine Other (See Comments)    GI upset   Tramadol Other (See Comments)    Nervousness, causes hands to shake   Aspirin Nausea Only   Codeine Itching   Hydrocodone Nausea Only and Other (See Comments)    Nose bleed   Metformin And Related Diarrhea and Nausea Only   Oxycodone Nausea And Vomiting    GI Upset (intolerance)   Oxycodone-Acetaminophen Nausea Only   Victoza [Liraglutide] Other (See Comments)    Abdominal pain    Medications Reviewed Today     Reviewed by Darius Bump, [facility] (Pharmacist) on 10/09/21 at 1018  Med List Status: <None>   Medication Order Taking? Sig Documenting Provider Last Dose Status Informant  Accu-Chek Softclix Lancets lancets [id number] Yes DX:E11.9 USE AS DIRECTED Hali Marry, MD Taking Active   acetaminophen (TYLENOL) 650 MG CR tablet [id number] Yes Take 650 mg by mouth every 8 (eight) hours as needed for pain. [provider] Taking Active   aspirin EC 81 MG tablet [id number] Yes  Take 1 tablet (81 mg total) by mouth daily. Traci Sermon, PA-C Taking Active Multiple Informants           Med Note Maryruth Eve, Leanna Battles   Thu Dec 25, 2020 10:27 AM)    atorvastatin (LIPITOR) 40 MG tablet [id number] Yes Take 1 tablet (40 mg total) by mouth at bedtime. Hali Marry, MD Taking Active   B-D ULTRAFINE III SHORT PEN 31G X 8 MM MISC [id number] Yes USE AS DIRECTED Hali Marry, MD Taking Active Multiple Informants  Blood Glucose Monitoring Suppl (ACCU-CHEK AVIVA PLUS) w/Device KIT [id number] Yes Check blood sugars 3 times daily DX:E11.9 Hali Marry, MD Taking Active Multiple Informants  cholecalciferol (VITAMIN D) 1000 units tablet [id number] Yes Take 1,000 Units by mouth daily. [provider] Taking Active Multiple Informants  clobetasol ointment (TEMOVATE) 0.05 % [id number] No Apply topically.  Patient not taking: Reported on 10/09/2021   [provider] Not Taking Active   dexlansoprazole (DEXILANT) 60 MG capsule [id number] Yes Take 60 mg by mouth daily. [provider] Taking Active Multiple Informants  dicyclomine (BENTYL) 20 MG tablet [id number] Yes Take 20 mg by mouth every 6 (six) hours as needed for spasms (cramps/stomach pain). [provider] Taking Active Multiple Informants  doxepin (SINEQUAN) 10 MG capsule [id number] Yes Take 10 mg by mouth at bedtime. [provider] Taking Active Multiple Informants  famotidine (PEPCID) 20 MG tablet [id number] Yes Take by mouth. [provider] Taking Active   Ferrous Sulfate ([facility] SLOW RELEASE IRON PO) [id number] Yes Take 1 tablet by mouth daily. [provider] Taking Active   fluticasone (FLONASE) 50 MCG/ACT nasal spray [id number] Yes PLACE 1 SPRAY INTO BOTH NOSTRILS DAILY. Hali Marry, MD Taking Active   glipiZIDE (GLUCOTROL) 10 MG tablet [id number] Yes TAKE 1 TABLET BY MOUTH TWICE A DAY Hali Marry, MD Taking Active   glucose blood  (ACCU-CHEK GUIDE) test strip [id number] Yes CHECK BLOOD SUGAR 3 TIMES DAILY. DX: E11.9 Hali Marry, MD Taking Active   hydrochlorothiazide (HYDRODIURIL) 25 MG tablet [id number] Yes TAKE 1 TABLET (25 MG TOTAL) BY MOUTH DAILY AS NEEDED. FOR SWELLING Hali Marry, MD Taking Active   insulin glargine (LANTUS SOLOSTAR) 100 UNIT/ML Solostar Pen [id number] Yes Inject 36 Units into the skin at  bedtime as needed (high blood sugar).  Patient taking differently: Inject 36 Units into the skin at bedtime as needed (high blood sugar). Patient currently using 20-30 units daily   Hali Marry, MD Taking Active   JARDIANCE 25 MG TABS tablet [id number] Yes TAKE 25 MG BY MOUTH DAILY. Hali Marry, MD Taking Active   Krill Oil 500 MG CAPS [id number] Yes Take 500 mg by mouth daily. [provider] Taking Active Multiple Informants           Med Note Arbutus Ped Jan 22, 2021  8:48 AM)    levocetirizine (XYZAL) 5 MG tablet [id number] Yes TAKE 1 TABLET BY MOUTH EVERY DAY IN THE Erskine Emery, MD Taking Active   Multiple Vitamin (MULTIVITAMIN WITH MINERALS) TABS tablet [id number] Yes Take 1 tablet by mouth daily. [provider] Taking Active Multiple Informants           Med Note Arbutus Ped Jan 22, 2021  8:48 AM)    OVER THE COUNTER MEDICATION [id number] Yes Take 1 tablet by mouth 3 (three) times daily. Golo otc weight loss supplement [provider] Taking Active Multiple Informants           Med Note Arbutus Ped Jan 22, 2021  8:48 AM)    perphenazine (TRILAFON) 8 MG tablet [id number] Yes Take 8 mg by mouth at bedtime. [provider] Taking Active Multiple Informants  PFIZER COVID-19 [facility] BIVALENT injection [id number]   [provider]  Active   pregabalin (LYRICA) 25 MG capsule [id number]  Take 5 capsules (125 mg total) by mouth 2 (two) times daily for 5 days, THEN 3 capsules (75 mg total) 2 (two)  times daily for 5 days, THEN 2 capsules (50 mg total) 2 (two) times daily for 5 days, THEN 1 capsule (25 mg total) 2 (two) times daily for 5 days. Hali Marry, MD  Expired 08/10/21 2359   pregabalin (LYRICA) 25 MG capsule [id number] No one capsule (25 mg dose) 2 (two) times daily.  Patient not taking: Reported on 10/09/2021   [provider] Not Taking Active   sucralfate (CARAFATE) 1 g tablet [id number] Yes TAKE 1 TABLET (1 G TOTAL) BY MOUTH 4 TIMES A DAY WITH MEALS AND AT BEDTIME Hali Marry, MD Taking Active             Patient Active Problem List   Diagnosis Date Noted   Left hip pain 02/23/2021   Low back pain 02/23/2021   Gait abnormality 02/23/2021   Bilateral lower extremity edema 01/22/2021   Elevated CK 10/12/2020   Spinal stenosis 10/09/2020   Lumbar adjacent segment disease with spondylolisthesis 10/09/2020   Spinal stenosis, lumbar region with neurogenic claudication 04/16/2020   Peripheral neuropathy 11/28/2019   Paresthesia and pain of both upper extremities 11/12/2019   Chronic midline low back pain with bilateral sciatica 10/30/2019   Left thigh pain 10/30/2019   Numbness 10/30/2019   Meralgia paresthetica, left 09/12/2019   T[place] Must Quervain's disease (tenosynovitis) 02/06/2019   Acute bilateral low back pain without sciatica 12/01/2018   Orthostatic hypotension 12/01/2018   Failed back syndrome of lumbar spine 11/25/2017   Inflammation of sacroiliac joint ([place]) 06/07/2017   Displacement of lumbar intervertebral disc 11/22/2016   Fatty liver 09/27/2016   Status post bilateral breast reduction 03/23/2016   Hypertrophy of breast 08/12/2015   Spondylolisthesis of lumbar region 06/13/2015   History of  lumbar fusion 04/07/2015   Radiculopathy, lumbar region 04/07/2015   Essential hypertension 02/14/2015   Postprandial vomiting 02/04/2014   Obesity (BMI 30-39.9) 11/01/2013   Carotid artery stenosis 06/07/2013   Hereditary and idiopathic  peripheral neuropathy 06/07/2013   History of migraine 05/04/2013   Type 2 diabetes mellitus with other specified complication ([place]) 79/89/2119   SVT (supraventricular tachycardia) ([place]) 11/03/2010   GERD (gastroesophageal reflux disease) 10/29/2010   Chronic low back pain 10/29/2010   Hyperlipidemia associated with type 2 diabetes mellitus ([place]) 10/29/2010    Immunization History  Administered Date(s) Administered   Fluad Quad(high Dose 65+) 12/25/2020   Influenza Split 03/02/2011, 02/25/2012   Influenza, High Dose Seasonal PF 06/06/2017, 12/25/2017, 12/19/2018, 12/13/2019   Influenza,inj,Quad PF,6+ Mos 02/13/2015, 12/23/2015, 12/22/2016   Influenza-Unspecified 01/24/2013, 01/21/2014, 02/26/2015   Moderna Sars-Covid-2 Vaccination 06/08/2019, 07/06/2019   PFIZER(Purple Top)SARS-COV-2 Vaccination 03/25/2020   PNEUMOCOCCAL CONJUGATE-20 02/05/2021   PPD Test 02/16/2017   Pfizer Covid-19 Vaccine Bivalent Booster 82yr & up 07/05/2021   Pneumococcal Conjugate-13 02/14/2015   Pneumococcal Polysaccharide-23 05/12/2005, 01/17/2018, 07/15/2020   Td 07/07/2019   Tdap 04/27/2007, 07/30/2017, 11/16/2019   Zoster Recombinat (Shingrix) 04/01/2017, 08/10/2017    Conditions to be addressed/monitored: HTN, HLD, and DMII  Care Plan : Medication Management  Updates made by KDarius Bump RPH since 10/09/2021 12:00 AM     Problem: DM, HTN, HLD      Long-Range Goal: Disease Progression Prevention   Start Date: 06/15/2021  Recent Progress: On track  Priority: High  Note:   Current Barriers:  Suboptimal therapeutic regimen for diabetes  Pharmacist Clinical Goal(s):  Over the next 30 days, patient will adhere to plan to optimize therapeutic regimen for diabetes as evidenced by report of adherence to recommended medication management changes through collaboration with PharmD and provider.   Interventions: 1:1 collaboration with MHali Marry MD regarding development and update of  comprehensive plan of care as evidenced by provider attestation and co-signature Inter-disciplinary care team collaboration (see longitudinal plan of care) Comprehensive medication review performed; medication list updated in electronic medical record  Diabetes:   Controlled; current treatment:lantus 26 units, glipizide 155mBID, jardiance 2574maily;  Current glucose readings: checking AM, 11am, and 3-4PM.  fasting glucose: 155, post prandial glucose: 71-138  Denies hypoglycemic/hyperglycemic symptoms  Current meal patterns: eats breakfast, skips lunch, then 3-4PM for supper  breakfast: crackers, or sausage egg cheese biscuit; lunch: skips; dinner: mashed potatoes, baked beans, tur[place]wich; snacks: protein shake, ; drinks: pineapple peach fruit drink, diet ginger ale, water  Current exercise: ambulates throughout the day  Educated on goals of diabetes care, glucose values and a1c, as well as mechanism of action of various medicines used to control glucose. Previously counseled on ozempic dosing, administration technique, and benefits of switching to GLP1 therapy Recommended initiate ozempic 0.55m66mekly, patient requested her novonordisk application be mailed to her, and then can begin ozempic 0.55mg26mkly using medication sample at next PCP visit.   Hypertension:  Controlled; current treatment: hydrochlorothiazide 55mg 59my;   Current home readings: not currently checking  Denies hypotensive/hypertensive symptoms  Recommended continue current regimen, Hyperlipidemia:  Controlled; current treatment:atorvastatin 40mg d68m;   Recommended continue current regimen   Patient Goals/Self-Care Activities Over the next 30 days, patient will:  take medications as prescribed, check glucose daily, document, and provide at future appointments, and collaborate with provider on medication access solutions  Follow Up Plan: Telephone follow up appointment with care management team member  scheduled for:  1  month      Medication Assistance: Application for novonordisk  medication assistance program. in process.  Anticipated assistance start date TBD.  See plan of care for additional detail.  Patient's [facility] pharmacy [facility]  [id number] [id number]: [phone]: [phone] Uses pill box? Yes Pt endorses 80% compliance  Follow Up:  Patient agrees to Care Plan and Follow-up.  Plan: Telephone follow up appointment with care management team member scheduled for:  1 months  KLarinda Buttery PharmD Clinical Pharmacist [phone]

## 2021-10-09 NOTE — Patient Instructions (Signed)
Visit Information  Thank you for taking time to visit with me today. Please don't hesitate to contact me if I can be of assistance to you before our next scheduled telephone appointment.  Following are the goals we discussed today:  Patient Goals/Self-Care Activities Over the next 30 days, patient will:  take medications as prescribed, check glucose daily, document, and provide at future appointments, and collaborate with provider on medication access solutions  Follow Up Plan: Telephone follow up appointment with care management team member scheduled for:  1 month  Please call the care guide team at [phone] if you need to cancel or reschedule your appointment.    Patient verbalizes understanding of instructions and care plan provided today and agrees to view in [place]. Active MyChart status and patient understanding of how to access instructions and care plan via MyChart confirmed with patient.     Amanda Morris

## 2021-10-19 ENCOUNTER — Other Ambulatory Visit: Payer: Self-pay | Admitting: Family Medicine

## 2021-10-19 DIAGNOSIS — G6289 Other specified polyneuropathies: Secondary | ICD-10-CM

## 2021-10-20 ENCOUNTER — Encounter: Payer: Self-pay | Admitting: Family Medicine

## 2021-10-20 ENCOUNTER — Ambulatory Visit (INDEPENDENT_AMBULATORY_CARE_PROVIDER_SITE_OTHER): Payer: Medicare Other | Admitting: Family Medicine

## 2021-10-20 VITALS — BP 121/79 | HR 96 | Resp 16 | Ht 64.0 in | Wt 203.0 lb

## 2021-10-20 DIAGNOSIS — M961 Postlaminectomy syndrome, not elsewhere classified: Secondary | ICD-10-CM

## 2021-10-20 DIAGNOSIS — Z794 Long term (current) use of insulin: Secondary | ICD-10-CM

## 2021-10-20 DIAGNOSIS — I10 Essential (primary) hypertension: Secondary | ICD-10-CM

## 2021-10-20 DIAGNOSIS — G6289 Other specified polyneuropathies: Secondary | ICD-10-CM

## 2021-10-20 DIAGNOSIS — E1169 Type 2 diabetes mellitus with other specified complication: Secondary | ICD-10-CM

## 2021-10-20 LAB — POCT UA - MICROALBUMIN

## 2021-10-20 LAB — POCT GLYCOSYLATED HEMOGLOBIN (HGB A1C): Hemoglobin A1C: 6.5 % — AB (ref 4.0–5.6)

## 2021-10-20 MED ORDER — PREGABALIN 50 MG PO CAPS: 50.0000 mg | ORAL_CAPSULE | Freq: Two times a day (BID) | ORAL | 1 refills | Status: DC

## 2021-10-20 NOTE — Assessment & Plan Note (Signed)
She did start the Lyrica and she is up to 25 mg twice a day but says is not really helpful.  We discussed working our way up on that medication it can cause excess sedation so we usually do not start with a higher dose.  We will go ahead and increase to 50 mg twice a day but did let her know that after a few weeks if she feels that we need to go up we can increase to 75 or even add a midday dose if needed.

## 2021-10-20 NOTE — Assessment & Plan Note (Signed)
She is going to have surgery probably this summer to have some repairs done for screws in her spine that are shifting and moving.  Hopeful that this will really help with her pain and ability to function.  She really like to have it done before school starts.  She does keep her nephew after school.

## 2021-10-23 DIAGNOSIS — I10 Essential (primary) hypertension: Secondary | ICD-10-CM

## 2021-10-23 DIAGNOSIS — E1159 Type 2 diabetes mellitus with other circulatory complications: Secondary | ICD-10-CM

## 2021-10-23 DIAGNOSIS — Z794 Long term (current) use of insulin: Secondary | ICD-10-CM

## 2021-10-30 DIAGNOSIS — I10 Essential (primary) hypertension: Secondary | ICD-10-CM | POA: Diagnosis not present

## 2021-10-30 DIAGNOSIS — Z86010 Personal history of colonic polyps: Secondary | ICD-10-CM | POA: Diagnosis not present

## 2021-10-30 DIAGNOSIS — R110 Nausea: Secondary | ICD-10-CM | POA: Diagnosis not present

## 2021-10-30 DIAGNOSIS — R1013 Epigastric pain: Secondary | ICD-10-CM | POA: Diagnosis not present

## 2021-10-30 DIAGNOSIS — R1084 Generalized abdominal pain: Secondary | ICD-10-CM | POA: Diagnosis not present

## 2021-10-30 DIAGNOSIS — D509 Iron deficiency anemia, unspecified: Secondary | ICD-10-CM | POA: Diagnosis not present

## 2021-10-30 DIAGNOSIS — R194 Change in bowel habit: Secondary | ICD-10-CM | POA: Diagnosis not present

## 2021-10-30 DIAGNOSIS — K219 Gastro-esophageal reflux disease without esophagitis: Secondary | ICD-10-CM | POA: Diagnosis not present

## 2021-10-30 DIAGNOSIS — K589 Irritable bowel syndrome without diarrhea: Secondary | ICD-10-CM | POA: Diagnosis not present

## 2021-11-01 ENCOUNTER — Other Ambulatory Visit: Payer: Self-pay | Admitting: Family Medicine

## 2021-11-01 DIAGNOSIS — K2100 Gastro-esophageal reflux disease with esophagitis, without bleeding: Secondary | ICD-10-CM

## 2021-11-03 DIAGNOSIS — D509 Iron deficiency anemia, unspecified: Secondary | ICD-10-CM | POA: Diagnosis not present

## 2021-11-05 ENCOUNTER — Ambulatory Visit (INDEPENDENT_AMBULATORY_CARE_PROVIDER_SITE_OTHER): Payer: Medicare Other | Admitting: Pharmacist

## 2021-11-05 DIAGNOSIS — Z794 Long term (current) use of insulin: Secondary | ICD-10-CM

## 2021-11-05 DIAGNOSIS — E1169 Type 2 diabetes mellitus with other specified complication: Secondary | ICD-10-CM

## 2021-11-05 DIAGNOSIS — I10 Essential (primary) hypertension: Secondary | ICD-10-CM

## 2021-11-05 NOTE — Progress Notes (Signed)
Chronic Care Management Pharmacy Note  11/05/2021 Name:  Amanda Morris MRN:  [id number] DOB:  10/05/1950  Summary: addressed DM, HTN, HLD. She has questions about her ozempic paperwork and wants to discuss them when she returns the forms.  Glucose information from prior discussion: BG checked TID at home: 95-141 fasting, 140-150s postprandial. BG sometimes as low as 54 in evenings before supper - hypoG events not too often, asymptomatic. Insulin at 26 units nightly before bed.   Recommendations/Changes made from today's visit:  - No new changes, still in process of beginning ozempic with goal to discontinue insulin - Recommended initiate ozempic 0.46m weekly. Patient has completed paperwork and will meet with me next week to review/ask questions.  Plan: f/u with patient in 1 week  Subjective: Amanda Culbertsonis an 71y.o. year old female who is a primary patient of Metheney, CRene Kocher MD.  The CCM team was consulted for assistance with disease management and care coordination needs.    Engaged with patient by telephone for follow up visit in response to provider referral for pharmacy case management and/or care coordination services.   Consent to Services:  The patient was given the following information about Chronic Care Management services today, agreed to services, and gave verbal consent: 1. CCM service includes personalized support from designated clinical staff supervised by the primary care provider, including individualized plan of care and coordination with other care providers 2. 24/7 contact phone numbers for assistance for urgent and routine care needs. 3. Service will only be billed when office clinical staff spend 20 minutes or more in a month to coordinate care. 4. Only one practitioner may furnish and bill the service in a calendar month. 5.The patient may stop CCM services at any time (effective at the end of the month) by phone call to the office staff.  6. The patient will be responsible for cost sharing (co-pay) of up to 20% of the service fee (after annual deductible is met). Patient agreed to services and consent obtained.  Patient Care Team: MHali Marry MD as PCP - General (Family Medicine) WEulis Canner MD (Gastroenterology) TSilverio Decamp MD as Consulting Physician (Family Medicine) KDarius [facility]ump [facility]ist)  Recent office visits:  06/08/21-[name] (PCP) Seen for new referral for 2nd opinion for back surgery. Increase insulin to 28 units nightly for 1 week.  [facility]. 04/23/21-[name], MD (PCP) Seen for a diabetic follow up visit and Gastroesophageal Reflux. Ambulatory referral to Gastroenterology. Increase her Jardiance back to a whole tab daily and then decrease her insulin by 2 units. Labs ordered. Follow up in 3 months.  01/26/21-[name] (PCP) Medicare annual wellness visit. Follow up in 1 year.  01/22/21-[name] (PCP) Seen for follow up visit. Decrease glipizide to 5 mg twice daily. Labs ordered. Follow up in 3 months. 12/25/20-[name] (PCP) Seen for foot pain. Labs ordered. Flu vaccine given. Start over the counter Iron supplement. Start on Hydrochlorothiazide 25 mg. Follow up in 4 weeks.    Recent consult visits:  05/25/21-Karl A. Pleasant, PA (Gastroenterology) Seen for abdominal pain.  03/10/21-Kyle L. CChristella Noa MD (Neurosurgery) Notes not available. 02/23/21-Yijun YKrista Blue MD (Neurology) Follow up visit.  01/26/21-Karl A. Pleasant, PA (Gastroenterology) Labs ordered. Follow up in 3 months. 12/30/20-Matthew R. WJacqualyn Posey DPM (Podiatry) Sen for nail problem. ABI ordered. Follow up in 3 months.   Hospital visits:  None in previous 6 months  Objective:  Lab Results  Component Value Date   CREATININE 0.80 07/21/2021   CREATININE 0.73 12/25/2020    CREATININE 0.76 11/18/2020    Lab Results  Component Value Date   HGBA1C 6.5 (A) 10/20/2021   Last diabetic Eye exam:  Lab Results  Component Value Date/Time   HMDIABEYEEXA No Retinopathy 11/02/2018 12:00 AM    Last diabetic Foot exam: No results found for: "HMDIABFOOTEX"      Component Value Date/Time   CHOL 120 07/21/2021 0000   TRIG 91 07/21/2021 0000   HDL 45 (L) 07/21/2021 0000   CHOLHDL 2.7 07/21/2021 0000   VLDL 20 10/13/2020 1759   LDLCALC 58 07/21/2021 0000       Latest Ref Rng & Units 12/25/2020   12:00 AM 10/15/2020    3:43 AM 10/14/2020    3:58 AM  Hepatic Function  Total Protein 6.1 - 8.1 g/dL 7.3  5.2  5.5   Albumin 3.5 - 5.0 g/dL  2.4  2.4   AST 10 - 35 U/L _0 ALT 6 - 29 U/L _1 Alk Phosphatase 38 - 126 U/L  52  50   Total Bilirubin 0.2 - 1.2 mg/dL 0.3  0.1  0.6     Lab Results  Component Value Date/Time   TSH 1.73 12/25/2020 12:00 AM   TSH 1.650 11/28/2019 08:54 AM       Latest Ref Rng & Units 12/25/2020   12:00 AM 11/18/2020   12:00 AM 10/15/2020    3:43 AM  CBC  WBC 3.8 - 10.8 Thousand/uL 12.9  10.2  8.2   Hemoglobin 11.7 - 15.5 g/dL 11.3  11.1  9.2   Hematocrit 35.0 - 45.0 % 39.7  38.5  29.7   Platelets 140 - 400 Thousand/uL 350  429  233     Lab Results  Component Value Date/Time   VD25OH 33.5 11/28/2019 08:54 AM    Social History   Tobacco Use  Smoking Status Former   Types: Cigarettes   Quit date: 07/30/2007   Years since quitting: 14.2  Smokeless Tobacco Never   BP Readings from Last 3 Encounters:  10/20/21 121/79  07/21/21 (!) 104/56  06/08/21 115/67   Pulse Readings from Last 3 Encounters:  10/20/21 96  07/21/21 84  06/08/21 94   Wt Readings from Last 3 Encounters:  10/20/21 203 lb (92.1 kg)  07/21/21 213 lb (96.6 kg)  06/08/21 214 lb (97.1 kg)    Assessment: Review of patient past medical history, allergies, medications, health status, including review of consultants reports, laboratory and other  test data, was performed as part of comprehensive evaluation and provision of chronic care management services.   SDOH:  (Social Determinants of Health) assessments and interventions performed:    CCM Care Plan  Allergies  Allergen Reactions   Penicillins Hives and Other (See Comments)    PATIENT HAS HAD A PCN REACTION WITH IMMEDIATE RASH, FACIAL/TONGUE/THROAT SWELLING, SOB, OR LIGHTHEADEDNESS WITH HYPOTENSION:  #  #  YES  #  #  Has patient had a PCN reaction causing severe rash involving mucus membranes or skin necrosis: No Has patient had a PCN reaction that required hospitalization: No Has patient had a PCN reaction occurring within the last 10 years: No If all of the above answers are "NO", then may proceed with Cephalosporin use.    Duloxetine Other (See Comments)    GI  upset   Tramadol Other (See Comments)    Nervousness, causes hands to shake   Aspirin Nausea Only   Codeine Itching   Hydrocodone Nausea Only and Other (See Comments)    Nose bleed   Metformin And Related Diarrhea and Nausea Only   Oxycodone Nausea And Vomiting    GI Upset (intolerance)   Oxycodone-Acetaminophen Nausea Only   Victoza [Liraglutide] Other (See Comments)    Abdominal pain    Medications Reviewed Today     Reviewed by Hali Marry, MD (Physician) on 10/20/21 at [place] List Status: <None>   Medication Order Taking? Sig Documenting Provider Last Dose Status Informant  Accu-Chek Softclix Lancets lancets [id number] Yes DX:E11.9 USE AS DIRECTED Hali Marry, MD Taking Active   acetaminophen (TYLENOL) 650 MG CR tablet [id number] Yes Take 650 mg by mouth every 8 (eight) hours as needed for pain. [provider] Taking Active   aspirin EC 81 MG tablet [id number] Yes Take 1 tablet (81 mg total) by mouth daily. Traci Sermon, PA-C Taking Active Multiple Informants           Med Note Maryruth Eve, Leanna Battles   Thu Dec 25, 2020 10:27 AM)    atorvastatin (LIPITOR) 40 MG tablet  [id number] Yes Take 1 tablet (40 mg total) by mouth at bedtime. Hali Marry, MD Taking Active   B-D ULTRAFINE III SHORT PEN 31G X 8 MM MISC [id number] Yes USE AS DIRECTED Hali Marry, MD Taking Active Multiple Informants  Blood Glucose Monitoring Suppl (ACCU-CHEK AVIVA PLUS) w/Device KIT [id number] Yes Check blood sugars 3 times daily DX:E11.9 Hali Marry, MD Taking Active Multiple Informants  cholecalciferol (VITAMIN D) 1000 units tablet [id number] Yes Take 1,000 Units by mouth daily. [provider] Taking Active Multiple Informants   Patient not taking:   Discontinued 10/20/21 0742 (Completed Course)   dexlansoprazole (DEXILANT) 60 MG capsule [id number] Yes Take 60 mg by mouth daily. [provider] Taking Active Multiple Informants  dicyclomine (BENTYL) 20 MG tablet [id number] Yes Take 20 mg by mouth every 6 (six) hours as needed for spasms (cramps/stomach pain). [provider] Taking Active Multiple Informants  doxepin (SINEQUAN) 10 MG capsule [id number] Yes Take 10 mg by mouth at bedtime. [provider] Taking Active Multiple Informants  famotidine (PEPCID) 20 MG tablet [id number] Yes Take by mouth. [provider] Taking Active   Ferrous Sulfate ([facility] SLOW RELEASE IRON PO) [id number] Yes Take 1 tablet by mouth daily. [provider] Taking Active   fluticasone (FLONASE) 50 MCG/ACT nasal spray [id number] Yes PLACE 1 SPRAY INTO BOTH NOSTRILS DAILY. Hali Marry, MD Taking Active   glipiZIDE (GLUCOTROL) 10 MG tablet [id number] Yes TAKE 1 TABLET BY MOUTH TWICE A DAY Hali Marry, MD Taking Active   glucose blood (ACCU-CHEK GUIDE) test strip [id number] Yes CHECK BLOOD SUGAR 3 TIMES DAILY. DX: E11.9 Hali Marry, MD Taking Active   hydrochlorothiazide (HYDRODIURIL) 25 MG tablet [id number] Yes TAKE 1 TABLET (25 MG TOTAL) BY MOUTH DAILY AS NEEDED. FOR SWELLING Hali Marry, MD Taking Active    insulin glargine (LANTUS SOLOSTAR) 100 UNIT/ML Solostar Pen [id number] Yes Inject 36 Units into the skin at bedtime as needed (high blood sugar).  Patient taking differently: Inject 36 Units into the skin at bedtime as needed (high blood sugar). Patient currently using 20-30 units daily   Hali Marry, MD Taking Active   JARDIANCE 25 MG TABS tablet [id number] Yes TAKE 25 MG  BY MOUTH DAILY. Hali Marry, MD Taking Active   Krill Oil 500 MG CAPS [id number] Yes Take 500 mg by mouth daily. [provider] Taking Active Multiple Informants           Med Note Arbutus Ped Jan 22, 2021  8:48 AM)    levocetirizine (XYZAL) 5 MG tablet [id number] Yes TAKE 1 TABLET BY MOUTH EVERY DAY IN THE Erskine Emery, MD Taking Active   Multiple Vitamin (MULTIVITAMIN WITH MINERALS) TABS tablet [id number] Yes Take 1 tablet by mouth daily. [provider] Taking Active Multiple Informants           Med Note Arbutus Ped Jan 22, 2021  8:48 AM)    OVER THE COUNTER MEDICATION [id number] Yes Take 1 tablet by mouth 3 (three) times daily. Golo otc weight loss supplement [provider] Taking Active Multiple Informants           Med Note Arbutus Ped Jan 22, 2021  8:48 AM)    Discontinued 10/20/21 0913 (Completed Course)    Discontinued 10/20/21 0915 (No longer needed (for PRN medications))    Patient not taking:   Discontinued 10/20/21 0742 (Completed Course)   pregabalin (LYRICA) 50 MG capsule [id number]  Take 1 capsule (50 mg total) by mouth 2 (two) times daily. Hali Marry, MD  Active   sucralfate (CARAFATE) 1 g tablet [id number] Yes TAKE 1 TABLET (1 G TOTAL) BY MOUTH 4 TIMES A DAY WITH MEALS AND AT BEDTIME Hali Marry, MD Taking Active             Patient Active Problem List   Diagnosis Date Noted   Left hip pain 02/23/2021   Low back pain 02/23/2021   Gait abnormality 02/23/2021   Bilateral lower extremity  edema 01/22/2021   Elevated CK 10/12/2020   Spinal stenosis 10/09/2020   Lumbar adjacent segment disease with spondylolisthesis 10/09/2020   Spinal stenosis, lumbar region with neurogenic claudication 04/16/2020   Peripheral neuropathy 11/28/2019   Paresthesia and pain of both upper extremities 11/12/2019   Chronic midline low back pain with bilateral sciatica 10/30/2019   Left thigh pain 10/30/2019   Numbness 10/30/2019   Meralgia paresthetica, left 09/12/2019   Tennis Must Quervain's disease (tenosynovitis) 02/06/2019   Acute bilateral low back pain without sciatica 12/01/2018   Orthostatic hypotension 12/01/2018   Failed back syndrome of lumbar spine 11/25/2017   Inflammation of sacroiliac joint ([place]) 06/07/2017   Displacement of lumbar intervertebral disc 11/22/2016   Fatty liver 09/27/2016   Status post bilateral breast reduction 03/23/2016   Hypertrophy of breast 08/12/2015   Spondylolisthesis of lumbar region 06/13/2015   History of lumbar fusion 04/07/2015   Radiculopathy, lumbar region 04/07/2015   Essential hypertension 02/14/2015   Postprandial vomiting 02/04/2014   Obesity (BMI 30-39.9) 11/01/2013   Carotid artery stenosis 06/07/2013   Hereditary and idiopathic peripheral neuropathy 06/07/2013   History of migraine 05/04/2013   Type 2 diabetes mellitus with other specified complication ([place]) 29/51/8841   SVT (supraventricular tachycardia) ([place]) 11/03/2010   GERD (gastroesophageal reflux disease) 10/29/2010   Chronic low back pain 10/29/2010   Hyperlipidemia associated with type 2 diabetes mellitus ([place]) 10/29/2010    Immunization History  Administered Date(s) Administered   Fluad Quad(high Dose 65+) 12/25/2020   Influenza Split 03/02/2011, 02/25/2012   Influenza, High Dose Seasonal PF 06/06/2017, 12/25/2017, 12/19/2018, 12/13/2019   Influenza,inj,Quad PF,6+ Mos 02/13/2015, 12/23/2015, 12/22/2016  Influenza-Unspecified 01/24/2013, 01/21/2014, 02/26/2015   Moderna  Sars-Covid-2 Vaccination 06/08/2019, 07/06/2019   PFIZER(Purple Top)SARS-COV-2 Vaccination 03/25/2020   PNEUMOCOCCAL CONJUGATE-20 02/05/2021   PPD Test 02/16/2017   Pfizer Covid-19 Vaccine Bivalent Booster 57yr & up 07/05/2021   Pneumococcal Conjugate-13 02/14/2015   Pneumococcal Polysaccharide-23 05/12/2005, 01/17/2018, 07/15/2020   Td 07/07/2019   Tdap 04/27/2007, 07/30/2017, 11/16/2019   Zoster Recombinat (Shingrix) 04/01/2017, 08/10/2017    Conditions to be addressed/monitored: HTN, HLD, and DMII  Care Plan : Medication Management  Updates made by KDarius Bump RForest Hillsince 11/05/2021 12:00 AM     Problem: DM, HTN, HLD      Long-Range Goal: Disease Progression Prevention   Start Date: 06/15/2021  Recent Progress: On track  Priority: High  Note:   Current Barriers:  Suboptimal therapeutic regimen for diabetes  Pharmacist Clinical Goal(s):  Over the next 30 days, patient will adhere to plan to optimize therapeutic regimen for diabetes as evidenced by report of adherence to recommended medication management changes through collaboration with PharmD and provider.   Interventions: 1:1 collaboration with MHali Marry MD regarding development and update of comprehensive plan of care as evidenced by provider attestation and co-signature Inter-disciplinary care team collaboration (see longitudinal plan of care) Comprehensive medication review performed; medication list updated in electronic medical record  Diabetes:   Controlled; current treatment:lantus 26 units, glipizide 166mBID, jardiance 2528maily;  Current glucose readings: checking AM, 11am, and 3-4PM.  fasting glucose: 155, post prandial glucose: 71-138  Denies hypoglycemic/hyperglycemic symptoms  Current meal patterns: eats breakfast, skips lunch, then 3-4PM for supper  breakfast: crackers, or sausage egg cheese biscuit; lunch: skips; dinner: mashed potatoes, baked beans, tur[place]wich; snacks: protein  shake, ; drinks: pineapple peach fruit drink, diet ginger ale, water  Current exercise: ambulates throughout the day  Educated on goals of diabetes care, glucose values and a1c, as well as mechanism of action of various medicines used to control glucose. Previously counseled on ozempic dosing, administration technique, and benefits of switching to GLP1 therapy Recommended initiate ozempic 0.67m80mekly, patient requested her novonordisk application be mailed to her, and then can begin ozempic 0.67mg60mkly using medication sample at next PCP visit.   Hypertension:  Controlled; current treatment: hydrochlorothiazide 67mg 73my;   Current home readings: not currently checking  Denies hypotensive/hypertensive symptoms  Recommended continue current regimen, Hyperlipidemia:  Controlled; current treatment:atorvastatin 40mg d13m;   Recommended continue current regimen   Patient Goals/Self-Care Activities Over the next 7 days, patient will:  take medications as prescribed, check glucose daily, document, and provide at future appointments, and collaborate with provider on medication access solutions  Follow Up Plan: Telephone follow up appointment with care management team member scheduled for:  1 week       Medication Assistance: Application for novonordisk  medication assistance program. in process.  Anticipated assistance start date TBD.  See plan of care for additional detail.  Patient's preferred pharmacy is:  [id number] [phone] box? Yes Pt endorses 80% compliance  Follow Up:  Patient agrees to Care Plan and Follow-up.  Plan: Telephone follow up appointment with care management team member scheduled for:  1 week  Apolonio Cutting KLarinda Buttery Clinical Pharmacist [facility]  [phone]

## 2021-11-05 NOTE — Patient Instructions (Signed)
Visit Information  Thank you for taking time to visit with me today. Please don't hesitate to contact me if I can be of assistance to you before our next scheduled telephone appointment.  Following are the goals we discussed today:  Patient Goals/Self-Care Activities Over the next 7 days, patient will:  take medications as prescribed, check glucose daily, document, and provide at future appointments, and collaborate with provider on medication access solutions  Follow Up Plan: Telephone follow up appointment with care management team member scheduled for:  1 week  Please call the care guide team at [phone] if you need to cancel or reschedule your appointment.    The patient verbalized understanding of instructions, educational materials, and care plan provided today and agreed to receive a mailed copy of patient instructions, educational materials, and care plan.   Larinda Buttery, PharmD Clinical Pharmacist [facility] [phone]

## 2021-11-11 ENCOUNTER — Ambulatory Visit: Payer: Medicare Other | Admitting: Pharmacist

## 2021-11-11 DIAGNOSIS — I10 Essential (primary) hypertension: Secondary | ICD-10-CM

## 2021-11-11 DIAGNOSIS — E1169 Type 2 diabetes mellitus with other specified complication: Secondary | ICD-10-CM

## 2021-11-11 NOTE — Progress Notes (Signed)
Chronic Care Management Pharmacy Note  11/11/2021 Name:  Amanda Morris MRN:  [id number] DOB:  [birth year]  Summary: addressed DM, HTN, HLD. She has questions about her ozempic paperwork and wants to discuss them when she returns the forms.  Glucose remains same per patient: BG checked TID at home: 95-141 fasting, 140-150s postprandial. BG sometimes as low as 54 in evenings before supper - hypoG events not too often, asymptomatic. Insulin at 26 units nightly before bed.   Recommendations/Changes made from today's visit:  - Counseled patient on new medication, answered questions about insulin dosing, ozempic paperwork, and will fax completed patient assistance form today - Recommended initiate ozempic 0.15m weekly when shipment arrives to office.  Plan: f/u with patient in 1 month  Subjective: Amanda Hitzemanis an 71y.o. year old female who is a primary patient of Metheney, CRene Kocher MD.  The CCM team was consulted for assistance with disease management and care coordination needs.    Engaged with patient by telephone for follow up visit in response to provider referral for pharmacy case management and/or care coordination services.   Consent to Services:  The patient was given the following information about Chronic Care Management services today, agreed to services, and gave verbal consent: 1. CCM service includes personalized support from designated clinical staff supervised by the primary care provider, including individualized plan of care and coordination with other care providers 2. 24/7 contact phone numbers for assistance for urgent and routine care needs. 3. Service will only be billed when office clinical staff spend 20 minutes or more in a month to coordinate care. 4. Only one practitioner may furnish and bill the service in a calendar month. 5.The patient may stop CCM services at any time (effective at the end of the month) by phone call to the office staff.  6. The patient will be responsible for cost sharing (co-pay) of up to 20% of the service fee (after annual deductible is met). Patient agreed to services and consent obtained.  Patient Care Team: MHali Marry MD as PCP - General (Family Medicine) WEulis Canner MD (Gastroenterology) TSilverio Decamp MD as Consulting Physician (Family Medicine) KDarius Bump [facility](Pharmacist)  Recent office visits:  06/08/21-[name] (PCP) Seen for new referral for 2nd opinion for back surgery. Increase insulin to 28 units nightly for 1 week.  [facility]. 04/23/21-[name], MD (PCP) Seen for a diabetic follow up visit and Gastroesophageal Reflux. Ambulatory referral to Gastroenterology. Increase her Jardiance back to a whole tab daily and then decrease her insulin by 2 units. Labs ordered. Follow up in 3 months.  01/26/21-[name] (PCP) Medicare annual wellness visit. Follow up in 1 year.  01/22/21-[name] (PCP) Seen for follow up visit. Decrease glipizide to 5 mg twice daily. Labs ordered. Follow up in 3 months. 12/25/20-[name] (PCP) Seen for foot pain. Labs ordered. Flu vaccine given. Start over the counter Iron supplement. Start on Hydrochlorothiazide 25 mg. Follow up in 4 weeks.    Recent consult visits:  05/25/21-Karl A. Pleasant, PA (Gastroenterology) Seen for abdominal pain.  03/10/21-Kyle L. CChristella Noa MD (Neurosurgery) Notes not available. 02/23/21-Yijun YKrista Blue MD (Neurology) Follow up visit.  01/26/21-Karl A. Pleasant, PA (Gastroenterology) Labs ordered. Follow up in 3 months. 12/30/20-Matthew R. WJacqualyn Posey DPM (Podiatry) Sen for nail problem. ABI ordered. Follow up in 3 months.   Hospital visits:  None in  previous 6 months  Objective:  Lab Results  Component Value Date   CREATININE 0.80 07/21/2021   CREATININE 0.73 12/25/2020    CREATININE 0.76 11/18/2020    Lab Results  Component Value Date   HGBA1C 6.5 (A) 10/20/2021   Last diabetic Eye exam:  Lab Results  Component Value Date/Time   HMDIABEYEEXA No Retinopathy 11/02/2018 12:00 AM    Last diabetic Foot exam: No results found for: "HMDIABFOOTEX"      Component Value Date/Time   CHOL 120 07/21/2021 0000   TRIG 91 07/21/2021 0000   HDL 45 (L) 07/21/2021 0000   CHOLHDL 2.7 07/21/2021 0000   VLDL 20 10/13/2020 1759   LDLCALC 58 07/21/2021 0000       Latest Ref Rng & Units 12/25/2020   12:00 AM 10/15/2020    3:43 AM 10/14/2020    3:58 AM  Hepatic Function  Total Protein 6.1 - 8.1 g/dL 7.3  5.2  5.5   Albumin 3.5 - 5.0 g/dL  2.4  2.4   AST 10 - 35 U/L _0 ALT 6 - 29 U/L _1 Alk Phosphatase 38 - 126 U/L  52  50   Total Bilirubin 0.2 - 1.2 mg/dL 0.3  0.1  0.6     Lab Results  Component Value Date/Time   TSH 1.73 12/25/2020 12:00 AM   TSH 1.650 11/28/2019 08:54 AM       Latest Ref Rng & Units 12/25/2020   12:00 AM 11/18/2020   12:00 AM 10/15/2020    3:43 AM  CBC  WBC 3.8 - 10.8 Thousand/uL 12.9  10.2  8.2   Hemoglobin 11.7 - 15.5 g/dL 11.3  11.1  9.2   Hematocrit 35.0 - 45.0 % 39.7  38.5  29.7   Platelets 140 - 400 Thousand/uL 350  429  233     Lab Results  Component Value Date/Time   VD25OH 33.5 11/28/2019 08:54 AM    Social History   Tobacco Use  Smoking Status Former   Types: Cigarettes   Quit date: 07/30/2007   Years since quitting: 14.2  Smokeless Tobacco Never   BP Readings from Last 3 Encounters:  10/20/21 121/79  07/21/21 (!) 104/56  06/08/21 115/67   Pulse Readings from Last 3 Encounters:  10/20/21 96  07/21/21 84  06/08/21 94   Wt Readings from Last 3 Encounters:  10/20/21 203 lb (92.1 kg)  07/21/21 213 lb (96.6 kg)  06/08/21 214 lb (97.1 kg)    Assessment: Review of patient past medical history, allergies, medications, health status, including review of consultants reports, laboratory and other  test data, was performed as part of comprehensive evaluation and provision of chronic care management services.   SDOH:  (Social Determinants of Health) assessments and interventions performed:    CCM Care Plan  Allergies  Allergen Reactions   Penicillins Hives and Other (See Comments)    PATIENT HAS HAD A PCN REACTION WITH IMMEDIATE RASH, FACIAL/TONGUE/THROAT SWELLING, SOB, OR LIGHTHEADEDNESS WITH HYPOTENSION:  #  #  YES  #  #  Has patient had a PCN reaction causing severe rash involving mucus membranes or skin necrosis: No Has patient had a PCN reaction that required hospitalization: No Has patient had a PCN reaction occurring within the last 10 years: No If all of the above answers are "NO", then may proceed with Cephalosporin use.    Duloxetine Other (See Comments)    GI upset  Tramadol Other (See Comments)    Nervousness, causes hands to shake   Aspirin Nausea Only   Codeine Itching   Hydrocodone Nausea Only and Other (See Comments)    Nose bleed   Metformin And Related Diarrhea and Nausea Only   Oxycodone Nausea And Vomiting    GI Upset (intolerance)   Oxycodone-Acetaminophen Nausea Only   Victoza [Liraglutide] Other (See Comments)    Abdominal pain    Medications Reviewed Today     Reviewed by Darius Bump, [facility] (Pharmacist) on 11/11/21 at 20  Med List Status: <None>   Medication Order Taking? Sig Documenting Provider Last Dose Status Informant  Accu-Chek Softclix Lancets lancets [id number] No DX:E11.9 USE AS DIRECTED Hali Marry, MD Taking Active   acetaminophen (TYLENOL) 650 MG CR tablet [id number] No Take 650 mg by mouth every 8 (eight) hours as needed for pain. [provider] Taking Active   aspirin EC 81 MG tablet [id number] No Take 1 tablet (81 mg total) by mouth daily. Traci Sermon, PA-C Taking Active Multiple Informants           Med Note Maryruth Eve, Leanna Battles   Thu Dec 25, 2020 10:27 AM)    atorvastatin (LIPITOR) 40 MG tablet [id number]  No Take 1 tablet (40 mg total) by mouth at bedtime. Hali Marry, MD Taking Active   B-D ULTRAFINE III SHORT PEN 31G X 8 MM MISC [id number] No USE AS DIRECTED Hali Marry, MD Taking Active Multiple Informants  Blood Glucose Monitoring Suppl (ACCU-CHEK AVIVA PLUS) w/Device KIT [id number] No Check blood sugars 3 times daily DX:E11.9 Hali Marry, MD Taking Active Multiple Informants  cholecalciferol (VITAMIN D) 1000 units tablet [id number] No Take 1,000 Units by mouth daily. [provider] Taking Active Multiple Informants  dexlansoprazole (DEXILANT) 60 MG capsule [id number] No Take 60 mg by mouth daily. [provider] Taking Active Multiple Informants  dicyclomine (BENTYL) 20 MG tablet [id number] No Take 20 mg by mouth every 6 (six) hours as needed for spasms (cramps/stomach pain). [provider] Taking Active Multiple Informants  doxepin (SINEQUAN) 10 MG capsule [id number] No Take 10 mg by mouth at bedtime. [provider] Taking Active Multiple Informants  famotidine (PEPCID) 20 MG tablet [id number] No Take by mouth. [provider] Taking Active   Ferrous Sulfate ([facility] SLOW RELEASE IRON PO) [id number] No Take 1 tablet by mouth daily. [provider] Taking Active   fluticasone (FLONASE) 50 MCG/ACT nasal spray [id number] No PLACE 1 SPRAY INTO BOTH NOSTRILS DAILY. Hali Marry, MD Taking Active   glipiZIDE (GLUCOTROL) 10 MG tablet [id number] No TAKE 1 TABLET BY MOUTH TWICE A DAY Hali Marry, MD Taking Active   glucose blood (ACCU-CHEK GUIDE) test strip [id number] No CHECK BLOOD SUGAR 3 TIMES DAILY. DX: E11.9 Hali Marry, MD Taking Active   hydrochlorothiazide (HYDRODIURIL) 25 MG tablet [id number] No TAKE 1 TABLET (25 MG TOTAL) BY MOUTH DAILY AS NEEDED. FOR SWELLING Hali Marry, MD Taking Active   insulin glargine (LANTUS SOLOSTAR) 100 UNIT/ML Solostar Pen [id number] No Inject 36 Units into the  skin at bedtime as needed (high blood sugar).  Patient taking differently: Inject 36 Units into the skin at bedtime as needed (high blood sugar). Patient currently using 20-30 units daily   Hali Marry, MD Taking Active   JARDIANCE 25 MG TABS tablet [id number] No TAKE 25 MG BY MOUTH DAILY. Hali Marry, MD Taking Active   Krill Oil 500 MG CAPS  [id number] No Take 500 mg by mouth daily. [provider] Taking Active Multiple Informants           Med Note Arbutus Ped Jan 22, 2021  8:48 AM)    levocetirizine (XYZAL) 5 MG tablet [id number] No TAKE 1 TABLET BY MOUTH EVERY DAY IN THE Erskine Emery, MD Taking Active   Multiple Vitamin (MULTIVITAMIN WITH MINERALS) TABS tablet [id number] No Take 1 tablet by mouth daily. [provider] Taking Active Multiple Informants           Med Note Arbutus Ped Jan 22, 2021  8:48 AM)    OVER THE COUNTER MEDICATION [id number] No Take 1 tablet by mouth 3 (three) times daily. Golo otc weight loss supplement [provider] Taking Active Multiple Informants           Med Note Maryruth Eve, Leanna Battles   Thu Jan 22, 2021  8:48 AM)    pregabalin (LYRICA) 50 MG capsule [id number]  Take 1 capsule (50 mg total) by mouth 2 (two) times daily. Hali Marry, MD  Active   sucralfate (CARAFATE) 1 g tablet [id number]  TAKE 1 TABLET (1 G TOTAL) BY MOUTH 4 TIMES A DAY WITH MEALS AND AT BEDTIME Hali Marry, MD  Active             Patient Active Problem List   Diagnosis Date Noted   Left hip pain 02/23/2021   Low back pain 02/23/2021   Gait abnormality 02/23/2021   Bilateral lower extremity edema 01/22/2021   Elevated CK 10/12/2020   Spinal stenosis 10/09/2020   Lumbar adjacent segment disease with spondylolisthesis 10/09/2020   Spinal stenosis, lumbar region with neurogenic claudication 04/16/2020   Peripheral neuropathy 11/28/2019   Paresthesia and pain of both upper extremities  11/12/2019   Chronic midline low back pain with bilateral sciatica 10/30/2019   Left thigh pain 10/30/2019   Numbness 10/30/2019   Meralgia paresthetica, left 09/12/2019   Tennis Must Quervain's disease (tenosynovitis) 02/06/2019   Acute bilateral low back pain without sciatica 12/01/2018   Orthostatic hypotension 12/01/2018   Failed back syndrome of lumbar spine 11/25/2017   Inflammation of sacroiliac joint ([place]) 06/07/2017   Displacement of lumbar intervertebral disc 11/22/2016   Fatty liver 09/27/2016   Status post bilateral breast reduction 03/23/2016   Hypertrophy of breast 08/12/2015   Spondylolisthesis of lumbar region 06/13/2015   History of lumbar fusion 04/07/2015   Radiculopathy, lumbar region 04/07/2015   Essential hypertension 02/14/2015   Postprandial vomiting 02/04/2014   Obesity (BMI 30-39.9) 11/01/2013   Carotid artery stenosis 06/07/2013   Hereditary and idiopathic peripheral neuropathy 06/07/2013   History of migraine 05/04/2013   Type 2 diabetes mellitus with other specified complication ([place]) 47/12/6281   SVT (supraventricular tachycardia) ([place]) 11/03/2010   GERD (gastroesophageal reflux disease) 10/29/2010   Chronic low back pain 10/29/2010   Hyperlipidemia associated with type 2 diabetes mellitus ([place]) 10/29/2010    Immunization History  Administered Date(s) Administered   Fluad Quad(high Dose 65+) 12/25/2020   Influenza Split 03/02/2011, 02/25/2012   Influenza, High Dose Seasonal PF 06/06/2017, 12/25/2017, 12/19/2018, 12/13/2019   Influenza,inj,Quad PF,6+ Mos 02/13/2015, 12/23/2015, 12/22/2016   Influenza-Unspecified 01/24/2013, 01/21/2014, 02/26/2015   Moderna Sars-Covid-2 Vaccination 06/08/2019, 07/06/2019   PFIZER(Purple Top)SARS-COV-2 Vaccination 03/25/2020   PNEUMOCOCCAL CONJUGATE-20 02/05/2021   PPD Test 02/16/2017   Pfizer Covid-19 Vaccine Bivalent Booster 92yrs & up 07/05/2021   Pneumococcal Conjugate-13 02/14/2015   Pneumococcal Polysaccharide-23  05/12/2005, 01/17/2018, 07/15/2020   Td 07/07/2019   Tdap 04/27/2007, 07/30/2017, 11/16/2019   Zoster Recombinat (Shingrix) 04/01/2017, 08/10/2017    Conditions to be addressed/monitored: HTN, HLD, and DMII  Care Plan : Medication Management  Updates made by Darius Bump, [place] since 11/11/2021 12:00 AM     Problem: DM, HTN, HLD      Long-Range Goal: Disease Progression Prevention   Start Date: 06/15/2021  Recent Progress: On track  Priority: High  Note:   Current Barriers:  Suboptimal therapeutic regimen for diabetes  Pharmacist Clinical Goal(s):  Over the next 30 days, patient will adhere to plan to optimize therapeutic regimen for diabetes as evidenced by report of adherence to recommended medication management changes through collaboration with PharmD and provider.   Interventions: 1:1 collaboration with Hali Marry, MD regarding development and update of comprehensive plan of care as evidenced by provider attestation and co-signature Inter-disciplinary care team collaboration (see longitudinal plan of care) Comprehensive medication review performed; medication list updated in electronic medical record  Diabetes:   Controlled; current treatment:lantus 26 units, glipizide 8m BID, jardiance 254mdaily;  Current glucose readings: checking AM, 11am, and 3-4PM.  fasting glucose: 155, post prandial glucose: 71-138  Denies hypoglycemic/hyperglycemic symptoms  Current meal patterns: eats breakfast, skips lunch, then 3-4PM for supper  breakfast: crackers, or sausage egg cheese biscuit; lunch: skips; dinner: mashed potatoes, baked beans, tuKuwaitsandwich; snacks: protein shake, ; drinks: pineapple peach fruit drink, diet ginger ale, water  Current exercise: ambulates throughout the day  Educated on goals of diabetes care, glucose values and a1c, as well as mechanism of action of various medicines used to control glucose. Previously counseled on ozempic dosing,  administration technique, and benefits of switching to GLP1 therapy Recommended initiate ozempic 0.2558meekly,  Hypertension:  Controlled; current treatment: hydrochlorothiazide 42m12mily;   Current home readings: not currently checking  Denies hypotensive/hypertensive symptoms  Recommended continue current regimen, Hyperlipidemia:  Controlled; current treatment:atorvastatin 40mg97mly;   Recommended continue current regimen   Patient Goals/Self-Care Activities Over the next 30 days, patient will:  take medications as prescribed, check glucose daily, document, and provide at future appointments, and collaborate with provider on medication access solutions  Follow Up Plan: Telephone follow up appointment with care management team member scheduled for:  1 month        Medication Assistance: Application for novonordisk  medication assistance program. in process.  Anticipated assistance start date TBD.  See plan of care for additional detail.  Patient's preferred pharmacy is:  [id number] [address] [address]: [phone] pill box? Yes Pt endorses 80% compliance  Follow Up:  Patient agrees to Care Plan and Follow-up.  Plan: Telephone follow up appointment with care management team member scheduled for:  1 month  KeeshaLarinda ButterymD Clinical Pharmacist [phone]

## 2021-11-11 NOTE — Patient Instructions (Signed)
Visit Information  Thank you for taking time to visit with me today. Please don't hesitate to contact me if I can be of assistance to you before our next scheduled telephone appointment.  Following are the goals we discussed today:    Patient Goals/Self-Care Activities Over the next 30 days, patient will:  take medications as prescribed, check glucose daily, document, and provide at future appointments, and collaborate with provider on medication access solutions  Follow Up Plan: Telephone follow up appointment with care management team member scheduled for:  1 month  Please call the care guide team at [phone] if you need to cancel or reschedule your appointment.   The patient verbalized understanding of instructions, educational materials, and care plan provided today and agreed to receive a mailed copy of patient instructions, educational materials, and care plan.   Darius Bump

## 2021-11-15 ENCOUNTER — Other Ambulatory Visit: Payer: Self-pay | Admitting: Family Medicine

## 2021-11-17 ENCOUNTER — Other Ambulatory Visit: Payer: Self-pay | Admitting: Family Medicine

## 2021-11-17 DIAGNOSIS — R600 Localized edema: Secondary | ICD-10-CM

## 2021-11-23 DIAGNOSIS — Z794 Long term (current) use of insulin: Secondary | ICD-10-CM

## 2021-11-23 DIAGNOSIS — I10 Essential (primary) hypertension: Secondary | ICD-10-CM | POA: Diagnosis not present

## 2021-11-23 DIAGNOSIS — E785 Hyperlipidemia, unspecified: Secondary | ICD-10-CM

## 2021-11-23 DIAGNOSIS — E1169 Type 2 diabetes mellitus with other specified complication: Secondary | ICD-10-CM | POA: Diagnosis not present

## 2021-11-26 ENCOUNTER — Other Ambulatory Visit: Payer: Self-pay | Admitting: Family Medicine

## 2021-11-30 ENCOUNTER — Other Ambulatory Visit: Payer: Self-pay | Admitting: Family Medicine

## 2021-11-30 DIAGNOSIS — K2100 Gastro-esophageal reflux disease with esophagitis, without bleeding: Secondary | ICD-10-CM

## 2021-12-01 DIAGNOSIS — M79605 Pain in left leg: Secondary | ICD-10-CM | POA: Diagnosis not present

## 2021-12-01 DIAGNOSIS — M79604 Pain in right leg: Secondary | ICD-10-CM | POA: Diagnosis not present

## 2021-12-01 DIAGNOSIS — M549 Dorsalgia, unspecified: Secondary | ICD-10-CM | POA: Diagnosis not present

## 2021-12-08 DIAGNOSIS — M961 Postlaminectomy syndrome, not elsewhere classified: Secondary | ICD-10-CM | POA: Diagnosis not present

## 2021-12-12 ENCOUNTER — Other Ambulatory Visit: Payer: Self-pay | Admitting: Family Medicine

## 2021-12-18 ENCOUNTER — Other Ambulatory Visit: Payer: Self-pay | Admitting: Family Medicine

## 2021-12-18 DIAGNOSIS — E1169 Type 2 diabetes mellitus with other specified complication: Secondary | ICD-10-CM

## 2021-12-22 ENCOUNTER — Telehealth: Payer: Self-pay | Admitting: Family Medicine

## 2021-12-22 ENCOUNTER — Ambulatory Visit (INDEPENDENT_AMBULATORY_CARE_PROVIDER_SITE_OTHER): Payer: Medicare Other | Admitting: Pharmacist

## 2021-12-22 DIAGNOSIS — Z794 Long term (current) use of insulin: Secondary | ICD-10-CM

## 2021-12-22 DIAGNOSIS — E1169 Type 2 diabetes mellitus with other specified complication: Secondary | ICD-10-CM

## 2021-12-22 DIAGNOSIS — I10 Essential (primary) hypertension: Secondary | ICD-10-CM

## 2021-12-22 DIAGNOSIS — E785 Hyperlipidemia, unspecified: Secondary | ICD-10-CM

## 2021-12-22 NOTE — Telephone Encounter (Signed)
Received 5 boxes of Ozempic for patient on 12/22/21 at 10:00 am. I am calling patient to let her know medication is here to pick up. Patient states she will come tomorrow to pick up medication - CF

## 2021-12-22 NOTE — Progress Notes (Signed)
Chronic Care Management Pharmacy Note  12/22/2021 Name:  Amanda Morris MRN:  [id number] DOB:  October 05, 1950  Summary: addressed DM, HTN, HLD. She has been approved to receive ozempic via patient assistance.   Glucose remains same per patient: BG checked TID at home: 95-141 fasting, 140-150s postprandial. BG sometimes as low as 54 in evenings before supper - hypoG events not too often, asymptomatic. Insulin at 26 units nightly before bed.  Recommendations/Changes made from today's visit:  - Counseled patient on new medication, answered questions, she requests a face-to-face visit to review ozempic pen and administration technique  Plan: f/u with patient in office tomorrow  Subjective: Amanda Morris is an 71 y.o. year old female who is a primary patient of Metheney, Rene Kocher, MD.  The CCM team was consulted for assistance with disease management and care coordination needs.    Engaged with patient by telephone for follow up visit in response to provider referral for pharmacy case management and/or care coordination services.   Consent to Services:  The patient was given the following information about Chronic Care Management services today, agreed to services, and gave verbal consent: 1. CCM service includes personalized support from designated clinical staff supervised by the primary care provider, including individualized plan of care and coordination with other care providers 2. 24/7 contact phone numbers for assistance for urgent and routine care needs. 3. Service will only be billed when office clinical staff spend 20 minutes or more in a month to coordinate care. 4. Only one practitioner may furnish and bill the service in a calendar month. 5.The patient may stop CCM services at any time (effective at the end of the month) by phone call to the office staff. 6. The patient will be responsible for cost sharing (co-pay) of up to 20% of the service fee (after annual  deductible is met). Patient agreed to services and consent obtained.  Patient Care Team: Hali Marry, MD as PCP - General (Family Medicine) Eulis Canner, MD (Gastroenterology) Silverio Decamp, MD as Consulting Physician (Family Medicine) Darius Bump, [facility] (Pharmacist)  Recent office visits:  06/08/21-Catherine D. Madilyn Fireman, MD (PCP) Seen for new referral for 2nd opinion for back surgery. Increase insulin to 28 units nightly for 1 week.  AMB Referral to [facility]. Ambulatory referral to Neurosurgery. 04/23/21-[name], MD (PCP) Seen for a diabetic follow up visit and Gastroesophageal Reflux. Ambulatory referral to Gastroenterology. Increase her Jardiance back to a whole tab daily and then decrease her insulin by 2 units. Labs ordered. Follow up in 3 months.  01/26/21-Catherine D. Madilyn Fireman, MD (PCP) Medicare annual wellness visit. Follow up in 1 year.  01/22/21-Catherine D. Madilyn Fireman, MD (PCP) Seen for follow up visit. Decrease glipizide to 5 mg twice daily. Labs ordered. Follow up in 3 months. 12/25/20-Catherine D. Madilyn Fireman, MD (PCP) Seen for foot pain. Labs ordered. Flu vaccine given. Start over the counter Iron supplement. Start on Hydrochlorothiazide 25 mg. Follow up in 4 weeks.    Recent consult visits:  05/25/21-Karl A. Pleasant, PA (Gastroenterology) Seen for abdominal pain.  03/10/21-Kyle L. Christella Noa, MD (Neurosurgery) Notes not available. 02/23/21-Yijun Krista Blue, MD (Neurology) Follow up visit.  01/26/21-Karl A. Pleasant, PA (Gastroenterology) Labs ordered. Follow up in 3 months. 12/30/20-Matthew R. Jacqualyn Posey, DPM (Podiatry) Sen for nail problem. ABI ordered. Follow up in 3 months.   Hospital visits:  None in previous 6 months  Objective:  Lab Results  Component Value Date   CREATININE 0.80 07/21/2021  CREATININE 0.73 12/25/2020   CREATININE 0.76 11/18/2020    Lab Results  Component Value Date   HGBA1C 6.5 (A) 10/20/2021   Last  diabetic Eye exam:  Lab Results  Component Value Date/Time   HMDIABEYEEXA No Retinopathy 11/02/2018 12:00 AM    Last diabetic Foot exam: No results found for: "HMDIABFOOTEX"      Component Value Date/Time   CHOL 120 07/21/2021 0000   TRIG 91 07/21/2021 0000   HDL 45 (L) 07/21/2021 0000   CHOLHDL 2.7 07/21/2021 0000   VLDL 20 10/13/2020 1759   LDLCALC 58 07/21/2021 0000       Latest Ref Rng & Units 12/25/2020   12:00 AM 10/15/2020    3:43 AM 10/14/2020    3:58 AM  Hepatic Function  Total Protein 6.1 - 8.1 g/dL 7.3  5.2  5.5   Albumin 3.5 - 5.0 g/dL  2.4  2.4   AST 10 - 35 U/L 18  24  29    ALT 6 - 29 U/L 16  22  24    Alk Phosphatase 38 - 126 U/L  52  50   Total Bilirubin 0.2 - 1.2 mg/dL 0.3  0.1  0.6     Lab Results  Component Value Date/Time   TSH 1.73 12/25/2020 12:00 AM   TSH 1.650 11/28/2019 08:54 AM       Latest Ref Rng & Units 12/25/2020   12:00 AM 11/18/2020   12:00 AM 10/15/2020    3:43 AM  CBC  WBC 3.8 - 10.8 Thousand/uL 12.9  10.2  8.2   Hemoglobin 11.7 - 15.5 g/dL 11.3  11.1  9.2   Hematocrit 35.0 - 45.0 % 39.7  38.5  29.7   Platelets 140 - 400 Thousand/uL 350  429  233     Lab Results  Component Value Date/Time   VD25OH 33.5 11/28/2019 08:54 AM    Social History   Tobacco Use  Smoking Status Former   Types: Cigarettes   Quit date: 07/30/2007   Years since quitting: 14.4  Smokeless Tobacco Never   BP Readings from Last 3 Encounters:  10/20/21 121/79  07/21/21 (!) 104/56  06/08/21 115/67   Pulse Readings from Last 3 Encounters:  10/20/21 96  07/21/21 84  06/08/21 94   Wt Readings from Last 3 Encounters:  10/20/21 203 lb (92.1 kg)  07/21/21 213 lb (96.6 kg)  06/08/21 214 lb (97.1 kg)    Assessment: Review of patient past medical history, allergies, medications, health status, including review of consultants reports, laboratory and other test data, was performed as part of comprehensive evaluation and provision of chronic care management  services.   SDOH:  (Social Determinants of Health) assessments and interventions performed:    CCM Care Plan  Allergies  Allergen Reactions   Penicillins Hives and Other (See Comments)    PATIENT HAS HAD A PCN REACTION WITH IMMEDIATE RASH, FACIAL/TONGUE/THROAT SWELLING, SOB, OR LIGHTHEADEDNESS WITH HYPOTENSION:  #  #  YES  #  #  Has patient had a PCN reaction causing severe rash involving mucus membranes or skin necrosis: No Has patient had a PCN reaction that required hospitalization: No Has patient had a PCN reaction occurring within the last 10 years: No If all of the above answers are "NO", then may proceed with Cephalosporin use.    Duloxetine Other (See Comments)    GI upset   Tramadol Other (See Comments)    Nervousness, causes hands to shake   Aspirin Nausea Only  Codeine Itching   Hydrocodone Nausea Only and Other (See Comments)    Nose bleed   Metformin And Related Diarrhea and Nausea Only   Oxycodone Nausea And Vomiting    GI Upset (intolerance)   Oxycodone-Acetaminophen Nausea Only   Victoza [Liraglutide] Other (See Comments)    Abdominal pain    Medications Reviewed Today     Reviewed by Darius Bump, [facility] (Pharmacist) on 12/22/21 at [place] List Status: <None>   Medication Order Taking? Sig Documenting Provider Last Dose Status Informant  Accu-Chek Softclix Lancets lancets [id number] Yes DX:E11.9 USE AS DIRECTED Hali Marry, MD Taking Active   acetaminophen (TYLENOL) 650 MG CR tablet [id number] Yes Take 650 mg by mouth every 8 (eight) hours as needed for pain. [provider] Taking Active   aspirin EC 81 MG tablet [id number] Yes Take 1 tablet (81 mg total) by mouth daily. Traci Sermon, PA-C Taking Active Multiple Informants           Med Note Maryruth Eve, Leanna Battles   Thu Dec 25, 2020 10:27 AM)    atorvastatin (LIPITOR) 40 MG tablet [id number] Yes Take 1 tablet (40 mg total) by mouth at bedtime. Hali Marry, MD Taking Active    B-D ULTRAFINE III SHORT PEN 31G X 8 MM MISC [id number] Yes USE AS DIRECTED Hali Marry, MD Taking Active   Blood Glucose Monitoring Suppl (ACCU-CHEK AVIVA PLUS) w/Device KIT [id number] Yes Check blood sugars 3 times daily DX:E11.9 Hali Marry, MD Taking Active Multiple Informants  cholecalciferol (VITAMIN D) 1000 units tablet [id number] Yes Take 1,000 Units by mouth daily. [provider] Taking Active Multiple Informants  dexlansoprazole (DEXILANT) 60 MG capsule [id number] Yes Take 60 mg by mouth daily. [provider] Taking Active Multiple Informants  dicyclomine (BENTYL) 20 MG tablet [id number] Yes Take 20 mg by mouth every 6 (six) hours as needed for spasms (cramps/stomach pain). [provider] Taking Active Multiple Informants  doxepin (SINEQUAN) 10 MG capsule [id number] Yes Take 10 mg by mouth at bedtime. [provider] Taking Active Multiple Informants  famotidine (PEPCID) 20 MG tablet [id number] Yes Take by mouth. [provider] Taking Active   Ferrous Sulfate ([facility] SLOW RELEASE IRON PO) [id number] Yes Take 1 tablet by mouth daily. [provider] Taking Active   fluticasone (FLONASE) 50 MCG/ACT nasal spray [id number] Yes SPRAY 1 SPRAY INTO BOTH NOSTRILS DAILY. Hali Marry, MD Taking Active   glipiZIDE (GLUCOTROL) 10 MG tablet [id number] Yes TAKE 1 TABLET BY MOUTH TWICE A DAY Hali Marry, MD Taking Active   glucose blood (ACCU-CHEK GUIDE) test strip [id number] Yes CHECK BLOOD SUGAR 3 TIMES DAILY. DX: E11.9 Hali Marry, MD Taking Active   hydrochlorothiazide (HYDRODIURIL) 25 MG tablet [id number] Yes TAKE 1 TABLET (25 MG TOTAL) BY MOUTH DAILY AS NEEDED. FOR SWELLING Hali Marry, MD Taking Active   JARDIANCE 25 MG TABS tablet [id number] Yes TAKE 25 MG BY MOUTH DAILY. Hali Marry, MD Taking Active   Krill Oil 500 MG CAPS [id number] Yes Take 500 mg by mouth daily. [provider] Taking Active Multiple Informants           Med Note Arbutus Ped Jan 22, 2021  8:48 AM)    LANTUS SOLOSTAR 100 UNIT/ML Solostar Pen [id number] Yes INJECT 36 UNITS INTO THE SKIN AT BEDTIME AS NEEDED (HIGH BLOOD SUGAR). Hali Marry, MD Taking Active   levocetirizine (XYZAL) 5 MG tablet  [id number] Yes TAKE 1 TABLET BY MOUTH EVERY DAY IN THE Erskine Emery, MD Taking Active   Multiple Vitamin (MULTIVITAMIN WITH MINERALS) TABS tablet [id number] Yes Take 1 tablet by mouth daily. [provider] Taking Active Multiple Informants           Med Note Arbutus Ped Jan 22, 2021  8:48 AM)    OVER THE COUNTER MEDICATION [id number] Yes Take 1 tablet by mouth 3 (three) times daily. Golo otc weight loss supplement [provider] Taking Active Multiple Informants           Med Note Maryruth Eve, Leanna Battles   Thu Jan 22, 2021  8:48 AM)    pregabalin (LYRICA) 50 MG capsule [id number] Yes Take 1 capsule (50 mg total) by mouth 2 (two) times daily. Hali Marry, MD Taking Active   sucralfate (CARAFATE) 1 g tablet [id number] Yes TAKE 1 TABLET (1 G TOTAL) BY MOUTH 4 TIMES A DAY WITH MEALS AND AT BEDTIME Hali Marry, MD Taking Active             Patient Active Problem List   Diagnosis Date Noted   Left hip pain 02/23/2021   Low back pain 02/23/2021   Gait abnormality 02/23/2021   Bilateral lower extremity edema 01/22/2021   Elevated CK 10/12/2020   Spinal stenosis 10/09/2020   Lumbar adjacent segment disease with spondylolisthesis 10/09/2020   Spinal stenosis, lumbar region with neurogenic claudication 04/16/2020   Peripheral neuropathy 11/28/2019   Paresthesia and pain of both upper extremities 11/12/2019   Chronic midline low back pain with bilateral sciatica 10/30/2019   Left thigh pain 10/30/2019   Numbness 10/30/2019   Meralgia paresthetica, left 09/12/2019   Tennis Must Quervain's disease (tenosynovitis) 02/06/2019   Acute bilateral  low back pain without sciatica 12/01/2018   Orthostatic hypotension 12/01/2018   Failed back syndrome of lumbar spine 11/25/2017   Inflammation of sacroiliac joint ([place]) 06/07/2017   Displacement of lumbar intervertebral disc 11/22/2016   Fatty liver 09/27/2016   Status post bilateral breast reduction 03/23/2016   Hypertrophy of breast 08/12/2015   Spondylolisthesis of lumbar region 06/13/2015   History of lumbar fusion 04/07/2015   Radiculopathy, lumbar region 04/07/2015   Essential hypertension 02/14/2015   Postprandial vomiting 02/04/2014   Obesity (BMI 30-39.9) 11/01/2013   Carotid artery stenosis 06/07/2013   Hereditary and idiopathic peripheral neuropathy 06/07/2013   History of migraine 05/04/2013   Type 2 diabetes mellitus with other specified complication ([place]) 24/40/1027   SVT (supraventricular tachycardia) ([place]) 11/03/2010   GERD (gastroesophageal reflux disease) 10/29/2010   Chronic low back pain 10/29/2010   Hyperlipidemia associated with type 2 diabetes mellitus ([place]) 10/29/2010    Immunization History  Administered Date(s) Administered   Fluad Quad(high Dose 65+) 12/25/2020   Influenza Split 03/02/2011, 02/25/2012   Influenza, High Dose Seasonal PF 06/06/2017, 12/25/2017, 12/19/2018, 12/13/2019   Influenza,inj,Quad PF,6+ Mos 02/13/2015, 12/23/2015, 12/22/2016   Influenza-Unspecified 01/24/2013, 01/21/2014, 02/26/2015   Moderna Sars-Covid-2 Vaccination 06/08/2019, 07/06/2019   PFIZER(Purple Top)SARS-COV-2 Vaccination 03/25/2020   PNEUMOCOCCAL CONJUGATE-20 02/05/2021   PPD Test 02/16/2017   Pfizer Covid-19 Vaccine Bivalent Booster 10yr & up 07/05/2021   Pneumococcal Conjugate-13 02/14/2015   Pneumococcal Polysaccharide-23 05/12/2005, 01/17/2018, 07/15/2020   Td 07/07/2019   Tdap 04/27/2007, 07/30/2017, 11/16/2019   Zoster Recombinat (Shingrix) 04/01/2017, 08/10/2017    Conditions to be addressed/monitored: HTN, HLD, and DMII  There are no care plans that  you recently modified to display for this patient.  Medication Assistance: Application for novonordisk  medication assistance program. in process.  Anticipated assistance start date TBD.  See plan of care for additional detail.  Patient's [facility] pharmacy [facility]  [id number] [place] [id number]: [phone]: [phone] Uses pill box? Yes Pt endorses 80% compliance  Follow Up:  Patient agrees to Care Plan and Follow-up.  Plan: Face to Face appointment with care management team member scheduled for: tomorrow 12/23/21  KLarinda Buttery PharmD Clinical Pharmacist [phone]

## 2021-12-22 NOTE — Patient Instructions (Signed)
Visit Information  Thank you for taking time to visit with me today. Please don't hesitate to contact me if I can be of assistance to you before our next scheduled telephone appointment.  Following are the goals we discussed today:  Patient Goals/Self-Care Activities Over the next 30 days, patient will:  take medications as prescribed, check glucose daily, document, and provide at future appointments, and collaborate with provider on medication access solutions  Follow Up Plan: Face to face follow up appointment with care management team member scheduled for: tomorrow  Please call the care guide team at [phone] if you need to cancel or reschedule your appointment.   The patient verbalized understanding of instructions, educational materials, and care plan provided today and agreed to receive a mailed copy of patient instructions, educational materials, and care plan.   Darius Bump

## 2021-12-23 ENCOUNTER — Ambulatory Visit: Payer: Medicare Other | Admitting: Pharmacist

## 2021-12-23 DIAGNOSIS — I10 Essential (primary) hypertension: Secondary | ICD-10-CM

## 2021-12-23 DIAGNOSIS — E785 Hyperlipidemia, unspecified: Secondary | ICD-10-CM

## 2021-12-23 DIAGNOSIS — E1169 Type 2 diabetes mellitus with other specified complication: Secondary | ICD-10-CM

## 2021-12-23 DIAGNOSIS — M5417 Radiculopathy, lumbosacral region: Secondary | ICD-10-CM | POA: Diagnosis not present

## 2021-12-23 DIAGNOSIS — Z794 Long term (current) use of insulin: Secondary | ICD-10-CM

## 2021-12-23 NOTE — Progress Notes (Signed)
Chronic Care Management Pharmacy Note  12/23/2021 Name:  Amanda Morris MRN:  [id number] DOB:  1951-01-31  Summary: addressed DM, HTN, HLD. She has been approved to receive ozempic via patient assistance, today she received her medication supply at the office.   She is receiving an injection for pain today with [facility], though she is unsure what medication it will be.  Glucose remains same per patient: BG checked TID at home: 95-141 fasting, 140-150s postprandial. BG sometimes as low as 54 in evenings before supper - hypoG events not too often, asymptomatic. Insulin at 26 units nightly before bed.  Recommendations/Changes made from today's visit:  - Counseled patient on administration technique of ozempic, expected side effects, and reviewed dosing - If BG >200-300 during acute period after pain injection (uncertain if steroid or not), may TEMPORARILY increase lantus from 26 to 30 units nightly. - Begin ozempic 0.8m weekly on 12/30/21, KEEP lantus at same dose (26 units nightly). - If BG <70 and symptoms of low sugar, may reduce lantus to 10 units nightly   Plan: f/u with patient in 3-4 weeks  Subjective: Amanda Morris an 71y.o. year old female who is a primary patient of Metheney, CRene Kocher MD.  The CCM team was consulted for assistance with disease management and care coordination needs.    Engaged with patient by telephone for follow up visit in response to provider referral for pharmacy case management and/or care coordination services.   Consent to Services:  The patient was given the following information about Chronic Care Management services today, agreed to services, and gave verbal consent: 1. CCM service includes personalized support from designated clinical staff supervised by the primary care provider, including individualized plan of care and coordination with other care providers 2. 24/7 contact phone numbers for assistance for urgent and  routine care needs. 3. Service will only be billed when office clinical staff spend 20 minutes or more in a month to coordinate care. 4. Only one practitioner may furnish and bill the service in a calendar month. 5.The patient may stop CCM services at any time (effective at the end of the month) by phone call to the office staff. 6. The patient will be responsible for cost sharing (co-pay) of up to 20% of the service fee (after annual deductible is met). Patient agreed to services and consent obtained.  Patient Care Team: MHali Marry MD as PCP - General (Family Medicine) WEulis Canner MD (Gastroenterology) TSilverio Decamp MD as Consulting Physician (Family Medicine) KDarius Bump [facility])  Recent office visits:  06/08/21-[name] (PCP) Seen for new referral for 2nd opinion for back surgery. Increase insulin to 28 units nightly for 1 week.  [facility]. 04/23/21-[name], MD (PCP) Seen for a diabetic follow up visit and Gastroesophageal Reflux. Ambulatory referral to Gastroenterology. Increase her Jardiance back to a whole tab daily and then decrease her insulin by 2 units. Labs ordered. Follow up in 3 months.  01/26/21-[name] (PCP) Medicare annual wellness visit. Follow up in 1 year.  01/22/21-[name] (PCP) Seen for follow up visit. Decrease glipizide to 5 mg twice daily. Labs ordered. Follow up in 3 months. 12/25/20-[name] (PCP) Seen for foot pain. Labs ordered. Flu vaccine given. Start over the counter Iron supplement. Start on Hydrochlorothiazide 25 mg. Follow up in 4 weeks.    Recent  consult visits:  05/25/21-Karl A. Pleasant, PA (Gastroenterology) Seen for abdominal pain.  03/10/21-Kyle L. Christella Noa, MD (Neurosurgery) Notes not available. 02/23/21-Yijun Krista Blue, MD (Neurology) Follow up visit.  01/26/21-Karl A.  Pleasant, PA (Gastroenterology) Labs ordered. Follow up in 3 months. 12/30/20-Matthew R. Jacqualyn Posey, DPM (Podiatry) Sen for nail problem. ABI ordered. Follow up in 3 months.   Hospital visits:  None in previous 6 months  Objective:  Lab Results  Component Value Date   CREATININE 0.80 07/21/2021   CREATININE 0.73 12/25/2020   CREATININE 0.76 11/18/2020    Lab Results  Component Value Date   HGBA1C 6.5 (A) 10/20/2021   Last diabetic Eye exam:  Lab Results  Component Value Date/Time   HMDIABEYEEXA No Retinopathy 11/02/2018 12:00 AM    Last diabetic Foot exam: No results found for: "HMDIABFOOTEX"      Component Value Date/Time   CHOL 120 07/21/2021 0000   TRIG 91 07/21/2021 0000   HDL 45 (L) 07/21/2021 0000   CHOLHDL 2.7 07/21/2021 0000   VLDL 20 10/13/2020 1759   LDLCALC 58 07/21/2021 0000       Latest Ref Rng & Units 12/25/2020   12:00 AM 10/15/2020    3:43 AM 10/14/2020    3:58 AM  Hepatic Function  Total Protein 6.1 - 8.1 g/dL 7.3  5.2  5.5   Albumin 3.5 - 5.0 g/dL  2.4  2.4   AST 10 - 35 U/L _0 ALT 6 - 29 U/L _1 Alk Phosphatase 38 - 126 U/L  52  50   Total Bilirubin 0.2 - 1.2 mg/dL 0.3  0.1  0.6     Lab Results  Component Value Date/Time   TSH 1.73 12/25/2020 12:00 AM   TSH 1.650 11/28/2019 08:54 AM       Latest Ref Rng & Units 12/25/2020   12:00 AM 11/18/2020   12:00 AM 10/15/2020    3:43 AM  CBC  WBC 3.8 - 10.8 Thousand/uL 12.9  10.2  8.2   Hemoglobin 11.7 - 15.5 g/dL 11.3  11.1  9.2   Hematocrit 35.0 - 45.0 % 39.7  38.5  29.7   Platelets 140 - 400 Thousand/uL 350  429  233     Lab Results  Component Value Date/Time   VD25OH 33.5 11/28/2019 08:54 AM    Social History   Tobacco Use  Smoking Status Former   Types: Cigarettes   Quit date: 07/30/2007   Years since quitting: 14.4  Smokeless Tobacco Never   BP Readings from Last 3 Encounters:  10/20/21 121/79  07/21/21 (!) 104/56  06/08/21 115/67   Pulse Readings from Last 3  Encounters:  10/20/21 96  07/21/21 84  06/08/21 94   Wt Readings from Last 3 Encounters:  10/20/21 203 lb (92.1 kg)  07/21/21 213 lb (96.6 kg)  06/08/21 214 lb (97.1 kg)    Assessment: Review of patient past medical history, allergies, medications, health status, including review of consultants reports, laboratory and other test data, was performed as part of comprehensive evaluation and provision of chronic care management services.   SDOH:  (Social Determinants of Health) assessments and interventions performed:    CCM Care Plan  Allergies  Allergen Reactions   Penicillins Hives and Other (See Comments)    PATIENT HAS HAD A PCN REACTION WITH IMMEDIATE RASH, FACIAL/TONGUE/THROAT SWELLING, SOB, OR LIGHTHEADEDNESS WITH HYPOTENSION:  #  #  YES  #  #  Has patient  had a PCN reaction causing severe rash involving mucus membranes or skin necrosis: No Has patient had a PCN reaction that required hospitalization: No Has patient had a PCN reaction occurring within the last 10 years: No If all of the above answers are "NO", then may proceed with Cephalosporin use.    Duloxetine Other (See Comments)    GI upset   Tramadol Other (See Comments)    Nervousness, causes hands to shake   Aspirin Nausea Only   Codeine Itching   Hydrocodone Nausea Only and Other (See Comments)    Nose bleed   Metformin And Related Diarrhea and Nausea Only   Oxycodone Nausea And Vomiting    GI Upset (intolerance)   Oxycodone-Acetaminophen Nausea Only   Victoza [Liraglutide] Other (See Comments)    Abdominal pain    Medications Reviewed Today     Reviewed by Darius Bump, [facility] (Pharmacist) on 12/22/21 at [place] List Status: <None>   Medication Order Taking? Sig Documenting Provider Last Dose Status Informant  Accu-Chek Softclix Lancets lancets [id number] Yes DX:E11.9 USE AS DIRECTED Hali Marry, MD Taking Active   acetaminophen (TYLENOL) 650 MG CR tablet [id number] Yes Take 650 mg by mouth  every 8 (eight) hours as needed for pain. [provider] Taking Active   aspirin EC 81 MG tablet [id number] Yes Take 1 tablet (81 mg total) by mouth daily. Traci Sermon, PA-C Taking Active Multiple Informants           Med Note Maryruth Eve, Leanna Battles   Thu Dec 25, 2020 10:27 AM)    atorvastatin (LIPITOR) 40 MG tablet [id number] Yes Take 1 tablet (40 mg total) by mouth at bedtime. Hali Marry, MD Taking Active   B-D ULTRAFINE III SHORT PEN 31G X 8 MM MISC [id number] Yes USE AS DIRECTED Hali Marry, MD Taking Active   Blood Glucose Monitoring Suppl (ACCU-CHEK AVIVA PLUS) w/Device KIT [id number] Yes Check blood sugars 3 times daily DX:E11.9 Hali Marry, MD Taking Active Multiple Informants  cholecalciferol (VITAMIN D) 1000 units tablet [id number] Yes Take 1,000 Units by mouth daily. [provider] Taking Active Multiple Informants  dexlansoprazole (DEXILANT) 60 MG capsule [id number] Yes Take 60 mg by mouth daily. [provider] Taking Active Multiple Informants  dicyclomine (BENTYL) 20 MG tablet [id number] Yes Take 20 mg by mouth every 6 (six) hours as needed for spasms (cramps/stomach pain). [provider] Taking Active Multiple Informants  doxepin (SINEQUAN) 10 MG capsule [id number] Yes Take 10 mg by mouth at bedtime. [provider] Taking Active Multiple Informants  famotidine (PEPCID) 20 MG tablet [id number] Yes Take by mouth. [provider] Taking Active   Ferrous Sulfate ([facility] SLOW RELEASE IRON PO) [id number] Yes Take 1 tablet by mouth daily. [provider] Taking Active   fluticasone (FLONASE) 50 MCG/ACT nasal spray [id number] Yes SPRAY 1 SPRAY INTO BOTH NOSTRILS DAILY. Hali Marry, MD Taking Active   glipiZIDE (GLUCOTROL) 10 MG tablet [id number] Yes TAKE 1 TABLET BY MOUTH TWICE A DAY Hali Marry, MD Taking Active   glucose blood (ACCU-CHEK GUIDE) test strip [id number] Yes CHECK BLOOD SUGAR 3  TIMES DAILY. DX: E11.9 Hali Marry, MD Taking Active   hydrochlorothiazide (HYDRODIURIL) 25 MG tablet [id number] Yes TAKE 1 TABLET (25 MG TOTAL) BY MOUTH DAILY AS NEEDED. FOR SWELLING Hali Marry, MD Taking Active   JARDIANCE 25 MG TABS tablet [id number] Yes TAKE 25 MG BY MOUTH DAILY. Hali Marry, MD Taking  Active   Krill Oil 500 MG CAPS [id number] Yes Take 500 mg by mouth daily. [provider] Taking Active Multiple Informants           Med Note Arbutus Ped Jan 22, 2021  8:48 AM)    LANTUS SOLOSTAR 100 UNIT/ML Solostar Pen [id number] Yes INJECT 36 UNITS INTO THE SKIN AT BEDTIME AS NEEDED (HIGH BLOOD SUGAR). Hali Marry, MD Taking Active   levocetirizine Harlow Ohms) 5 MG tablet [id number] Yes TAKE 1 TABLET BY MOUTH EVERY DAY IN THE Erskine Emery, MD Taking Active   Multiple Vitamin (MULTIVITAMIN WITH MINERALS) TABS tablet [id number] Yes Take 1 tablet by mouth daily. [provider] Taking Active Multiple Informants           Med Note Arbutus Ped Jan 22, 2021  8:48 AM)    OVER THE COUNTER MEDICATION [id number] Yes Take 1 tablet by mouth 3 (three) times daily. Golo otc weight loss supplement [provider] Taking Active Multiple Informants           Med Note Maryruth Eve, Leanna Battles   Thu Jan 22, 2021  8:48 AM)    pregabalin (LYRICA) 50 MG capsule [id number] Yes Take 1 capsule (50 mg total) by mouth 2 (two) times daily. Hali Marry, MD Taking Active   sucralfate (CARAFATE) 1 g tablet [id number] Yes TAKE 1 TABLET (1 G TOTAL) BY MOUTH 4 TIMES A DAY WITH MEALS AND AT BEDTIME Hali Marry, MD Taking Active             Patient Active Problem List   Diagnosis Date Noted   Left hip pain 02/23/2021   Low back pain 02/23/2021   Gait abnormality 02/23/2021   Bilateral lower extremity edema 01/22/2021   Elevated CK 10/12/2020   Spinal stenosis 10/09/2020   Lumbar adjacent segment disease with  spondylolisthesis 10/09/2020   Spinal stenosis, lumbar region with neurogenic claudication 04/16/2020   Peripheral neuropathy 11/28/2019   Paresthesia and pain of both upper extremities 11/12/2019   Chronic midline low back pain with bilateral sciatica 10/30/2019   Left thigh pain 10/30/2019   Numbness 10/30/2019   Meralgia paresthetica, left 09/12/2019   Tennis Must Quervain's disease (tenosynovitis) 02/06/2019   Acute bilateral low back pain without sciatica 12/01/2018   Orthostatic hypotension 12/01/2018   Failed back syndrome of lumbar spine 11/25/2017   Inflammation of sacroiliac joint ([place]) 06/07/2017   Displacement of lumbar intervertebral disc 11/22/2016   Fatty liver 09/27/2016   Status post bilateral breast reduction 03/23/2016   Hypertrophy of breast 08/12/2015   Spondylolisthesis of lumbar region 06/13/2015   History of lumbar fusion 04/07/2015   Radiculopathy, lumbar region 04/07/2015   Essential hypertension 02/14/2015   Postprandial vomiting 02/04/2014   Obesity (BMI 30-39.9) 11/01/2013   Carotid artery stenosis 06/07/2013   Hereditary and idiopathic peripheral neuropathy 06/07/2013   History of migraine 05/04/2013   Type 2 diabetes mellitus with other specified complication ([place]) 73/42/8768   SVT (supraventricular tachycardia) ([place]) 11/03/2010   GERD (gastroesophageal reflux disease) 10/29/2010   Chronic low back pain 10/29/2010   Hyperlipidemia associated with type 2 diabetes mellitus ([place]) 10/29/2010    Immunization History  Administered Date(s) Administered   Fluad Quad(high Dose 65+) 12/25/2020   Influenza Split 03/02/2011, 02/25/2012   Influenza, High Dose Seasonal PF 06/06/2017, 12/25/2017, 12/19/2018, 12/13/2019   Influenza,inj,Quad PF,6+ Mos 02/13/2015, 12/23/2015, 12/22/2016   Influenza-Unspecified 01/24/2013, 01/21/2014, 02/26/2015   Moderna Sars-Covid-2 Vaccination 06/08/2019,  07/06/2019   PFIZER(Purple Top)SARS-COV-2 Vaccination 03/25/2020   PNEUMOCOCCAL  CONJUGATE-20 02/05/2021   PPD Test 02/16/2017   Pfizer Covid-19 Vaccine Bivalent Booster 1yr & up 07/05/2021   Pneumococcal Conjugate-13 02/14/2015   Pneumococcal Polysaccharide-23 05/12/2005, 01/17/2018, 07/15/2020   Td 07/07/2019   Tdap 04/27/2007, 07/30/2017, 11/16/2019   Zoster Recombinat (Shingrix) 04/01/2017, 08/10/2017    Conditions to be addressed/monitored: HTN, HLD, and DMII  There are no care plans that you recently modified to display for this patient.       Medication Assistance: Application for novonordisk  medication assistance program. in process.  Anticipated assistance start date TBD.  See plan of care for additional detail.  Patient's [facility] pharmacy [facility]  [id number] [id number]: [phone]: [phone] pill box? Yes Pt endorses 80% compliance  Follow Up:  Patient agrees to Care Plan and Follow-up.  Plan: Telephone follow up appointment with care management team member scheduled for:  3-4 weeks  KeLarinda ButteryPharmD Clinical Pharmacist CoBaptist Health [phone]

## 2021-12-23 NOTE — Telephone Encounter (Signed)
Medication picked up - CF

## 2021-12-23 NOTE — Patient Instructions (Signed)
Visit Information  Thank you for taking time to visit with me today. Please don't hesitate to contact me if I can be of assistance to you before our next scheduled telephone appointment.  Following are the goals we discussed today:   Patient Goals/Self-Care Activities Over the next 30 days, patient will:  take medications as prescribed, check glucose daily, document, and provide at future appointments, and collaborate with provider on medication access solutions  Follow Up Plan: Face to face follow up appointment with care management team member scheduled for: 3-4 weeks  Please call the care guide team at [phone] if you need to cancel or reschedule your appointment.   The patient verbalized understanding of instructions, educational materials, and care plan provided today and agreed to receive a mailed copy of patient instructions, educational materials, and care plan.   Amanda Morris

## 2021-12-24 DIAGNOSIS — Z794 Long term (current) use of insulin: Secondary | ICD-10-CM

## 2021-12-24 DIAGNOSIS — E785 Hyperlipidemia, unspecified: Secondary | ICD-10-CM

## 2021-12-24 DIAGNOSIS — I10 Essential (primary) hypertension: Secondary | ICD-10-CM

## 2021-12-24 DIAGNOSIS — E1169 Type 2 diabetes mellitus with other specified complication: Secondary | ICD-10-CM

## 2021-12-31 ENCOUNTER — Other Ambulatory Visit: Payer: Self-pay | Admitting: Family Medicine

## 2021-12-31 DIAGNOSIS — E119 Type 2 diabetes mellitus without complications: Secondary | ICD-10-CM

## 2022-01-01 ENCOUNTER — Ambulatory Visit: Payer: Medicare Other | Admitting: Podiatry

## 2022-01-04 ENCOUNTER — Other Ambulatory Visit: Payer: Self-pay | Admitting: Family Medicine

## 2022-01-04 DIAGNOSIS — G6289 Other specified polyneuropathies: Secondary | ICD-10-CM

## 2022-01-05 ENCOUNTER — Ambulatory Visit (INDEPENDENT_AMBULATORY_CARE_PROVIDER_SITE_OTHER): Payer: Medicare Other | Admitting: Pharmacist

## 2022-01-05 DIAGNOSIS — Z794 Long term (current) use of insulin: Secondary | ICD-10-CM

## 2022-01-05 DIAGNOSIS — E785 Hyperlipidemia, unspecified: Secondary | ICD-10-CM

## 2022-01-05 DIAGNOSIS — I10 Essential (primary) hypertension: Secondary | ICD-10-CM

## 2022-01-05 NOTE — Progress Notes (Signed)
Chronic Care Management Pharmacy Note  01/05/2022 Name:  Amanda Morris MRN:  [id number] DOB:  04/14/1951  Summary: addressed DM, HTN, HLD. She is receiving ozempic via patient assistance, began 0.84m weekly on 12/30/21, and experienced about 2 days of nausea, but otherwise no concerns. She has empirically reduced insulin dosing from lantus 20 units nightly, to 16 units nightly.  BG readings are still low with this change: Fasting: 70, 76, 56, 107, 117, 119, 124  Her "meal" sugar checks are actually closer to fasting state. Asked her to spot check BG during our call, after breakfast - was 114 about 2-3 hours postprandial.  Recommendations/Changes made from today's visit:  - Counseled patient on proper timing for checking both fasting and postprandial BG values - Recommend STOP insulin at this time, we will touch base in a few days to review blood glucose with this medication change before confirming/changing in med list.  Plan: f/u with patient in 3 days  Subjective: Amanda Bozichis an 71y.o. year old female who is a primary patient of Metheney, CRene Kocher MD.  The CCM team was consulted for assistance with disease management and care coordination needs.    Engaged with patient by telephone for follow up visit in response to provider referral for pharmacy case management and/or care coordination services.   Consent to Services:  The patient was given the following information about Chronic Care Management services today, agreed to services, and gave verbal consent: 1. CCM service includes personalized support from designated clinical staff supervised by the primary care provider, including individualized plan of care and coordination with other care providers 2. 24/7 contact phone numbers for assistance for urgent and routine care needs. 3. Service will only be billed when office clinical staff spend 20 minutes or more in a month to coordinate care. 4. Only one  practitioner may furnish and bill the service in a calendar month. 5.The patient may stop CCM services at any time (effective at the end of the month) by phone call to the office staff. 6. The patient will be responsible for cost sharing (co-pay) of up to 20% of the service fee (after annual deductible is met). Patient agreed to services and consent obtained.  Patient Care Team: MHali Marry MD as PCP - General (Family Medicine) WEulis Canner MD (Gastroenterology) TSilverio Decamp MD as Consulting Physician (Family Medicine) KDarius Bump [facility](Pharmac[facility])  Recent office visits:  06/08/21-[name] (PCP) Seen for new referral for 2nd opinion for back surgery. Increase insulin to 28 units nightly for 1 week.  [facility]. 04/23/21-[name], MD (PCP) Seen for a diabetic follow up visit and Gastroesophageal Reflux. Ambulatory referral to Gastroenterology. Increase her Jardiance back to a whole tab daily and then decrease her insulin by 2 units. Labs ordered. Follow up in 3 months.  01/26/21-[name] (PCP) Medicare annual wellness visit. Follow up in 1 year.  01/22/21-[name] (PCP) Seen for follow up visit. Decrease glipizide to 5 mg twice daily. Labs ordered. Follow up in 3 months. 12/25/20-[name] (PCP) Seen for foot pain. Labs ordered. Flu vaccine given. Start over the counter Iron supplement. Start on Hydrochlorothiazide 25 mg. Follow up in 4 weeks.    Recent consult visits:  05/25/21-Karl A. Pleasant, PA (Gastroenterology) Seen for abdominal pain.  03/10/21-Kyle L. CChristella Noa MD (Neurosurgery) Notes not available. 02/23/21-Yijun YKrista Blue MD (Neurology) Follow up  visit.  01/26/21-Karl A. Pleasant, PA (Gastroenterology) Labs ordered. Follow up in 3 months. 12/30/20-Matthew R. Jacqualyn Posey, DPM (Podiatry) Sen for nail problem. ABI ordered.  Follow up in 3 months.   Hospital visits:  None in previous 6 months  Objective:  Lab Results  Component Value Date   CREATININE 0.80 07/21/2021   CREATININE 0.73 12/25/2020   CREATININE 0.76 11/18/2020    Lab Results  Component Value Date   HGBA1C 6.5 (A) 10/20/2021   Last diabetic Eye exam:  Lab Results  Component Value Date/Time   HMDIABEYEEXA No Retinopathy 11/02/2018 12:00 AM    Last diabetic Foot exam: No results found for: "HMDIABFOOTEX"      Component Value Date/Time   CHOL 120 07/21/2021 0000   TRIG 91 07/21/2021 0000   HDL 45 (L) 07/21/2021 0000   CHOLHDL 2.7 07/21/2021 0000   VLDL 20 10/13/2020 1759   LDLCALC 58 07/21/2021 0000       Latest Ref Rng & Units 12/25/2020   12:00 AM 10/15/2020    3:43 AM 10/14/2020    3:58 AM  Hepatic Function  Total Protein 6.1 - 8.1 g/dL 7.3  5.2  5.5   Albumin 3.5 - 5.0 g/dL  2.4  2.4   AST 10 - 35 U/L 18  24  29    ALT 6 - 29 U/L 16  22  24    Alk Phosphatase 38 - 126 U/L  52  50   Total Bilirubin 0.2 - 1.2 mg/dL 0.3  0.1  0.6     Lab Results  Component Value Date/Time   TSH 1.73 12/25/2020 12:00 AM   TSH 1.650 11/28/2019 08:54 AM       Latest Ref Rng & Units 12/25/2020   12:00 AM 11/18/2020   12:00 AM 10/15/2020    3:43 AM  CBC  WBC 3.8 - 10.8 Thousand/uL 12.9  10.2  8.2   Hemoglobin 11.7 - 15.5 g/dL 11.3  11.1  9.2   Hematocrit 35.0 - 45.0 % 39.7  38.5  29.7   Platelets 140 - 400 Thousand/uL 350  429  233     Lab Results  Component Value Date/Time   VD25OH 33.5 11/28/2019 08:54 AM    Social History   Tobacco Use  Smoking Status Former   Types: Cigarettes   Quit date: 07/30/2007   Years since quitting: 14.4  Smokeless Tobacco Never   BP Readings from Last 3 Encounters:  10/20/21 121/79  07/21/21 (!) 104/56  06/08/21 115/67   Pulse Readings from Last 3 Encounters:  10/20/21 96  07/21/21 84  06/08/21 94   Wt Readings from Last 3 Encounters:  10/20/21 203 lb (92.1 kg)  07/21/21 213 lb (96.6 kg)   06/08/21 214 lb (97.1 kg)    Assessment: Review of patient past medical history, allergies, medications, health status, including review of consultants reports, laboratory and other test data, was performed as part of comprehensive evaluation and provision of chronic care management services.   SDOH:  (Social Determinants of Health) assessments and interventions performed:  SDOH Interventions    Flowsheet Row Office Visit from 01/26/2021 in [place] Most recent reading at 01/26/2021  4:13 PM Office Visit from 02/05/2019 in [place] Most recent reading at 02/05/2019 11:27 AM Clinical Support from 07/11/2018 in [place] Most recent reading at 07/11/2018  2:12 PM Office Visit from 07/11/2018 in [place] Most recent reading at 07/11/2018  9:10 AM  SDOH Interventions      Food Insecurity Interventions Intervention Not Indicated -- -- --  Housing Interventions Intervention Not Indicated -- -- --  Transportation Interventions Intervention Not Indicated -- -- --  Depression Interventions/Treatment  -- Currently on Treatment Medication Currently on Treatment  Financial Strain Interventions Intervention Not Indicated -- -- --  Physical Activity Interventions Intervention Not Indicated -- -- --  Stress Interventions Intervention Not Indicated -- -- --  Social Connections Interventions Intervention Not Indicated -- -- --       CCM Care Plan  Allergies  Allergen Reactions   Penicillins Hives and Other (See Comments)    PATIENT HAS HAD A PCN REACTION WITH IMMEDIATE RASH, FACIAL/TONGUE/THROAT SWELLING, SOB, OR LIGHTHEADEDNESS WITH HYPOTENSION:  #  #  YES  #  #  Has patient had a PCN reaction causing severe rash involving mucus membranes or skin necrosis: No Has patient had a PCN reaction that required hospitalization: No Has patient had a PCN  reaction occurring within the last 10 years: No If all of the above answers are "NO", then may proceed with Cephalosporin use.    Duloxetine Other (See Comments)    GI upset   Tramadol Other (See Comments)    Nervousness, causes hands to shake   Aspirin Nausea Only   Codeine Itching   Hydrocodone Nausea Only and Other (See Comments)    Nose bleed   Metformin And Related Diarrhea and Nausea Only   Oxycodone Nausea And Vomiting    GI Upset (intolerance)   Oxycodone-Acetaminophen Nausea Only   Victoza [Liraglutide] Other (See Comments)    Abdominal pain    Medications Reviewed Today     Reviewed by Darius Bump, [facility] (Pharmacist) on 12/23/21 at [place] List Status: <None>   Medication Order Taking? Sig Documenting Provider Last Dose Status Informant  Accu-Chek Softclix Lancets lancets [id number] No DX:E11.9 USE AS DIRECTED Hali Marry, MD Taking Active   acetaminophen (TYLENOL) 650 MG CR tablet [id number] No Take 650 mg by mouth every 8 (eight) hours as needed for pain. [provider] Taking Active   aspirin EC 81 MG tablet [id number] No Take 1 tablet (81 mg total) by mouth daily. Traci Sermon, PA-C Taking Active Multiple Informants           Med Note Maryruth Eve, Leanna Battles   Thu Dec 25, 2020 10:27 AM)    atorvastatin (LIPITOR) 40 MG tablet [id number] No Take 1 tablet (40 mg total) by mouth at bedtime. Hali Marry, MD Taking Active   B-D ULTRAFINE III SHORT PEN 31G X 8 MM MISC [id number] No USE AS DIRECTED Hali Marry, MD Taking Active   Blood Glucose Monitoring Suppl (ACCU-CHEK AVIVA PLUS) w/Device KIT [id number] No Check blood sugars 3 times daily DX:E11.9 Hali Marry, MD Taking Active Multiple Informants  cholecalciferol (VITAMIN D) 1000 units tablet [id number] No Take 1,000 Units by mouth daily. [provider] Taking Active Multiple Informants  dexlansoprazole (DEXILANT) 60 MG capsule [id number] No Take 60 mg by mouth daily.  [provider] Taking Active Multiple Informants  dicyclomine (BENTYL) 20 MG tablet [id number] No Take 20 mg by mouth every 6 (six) hours as needed for spasms (cramps/stomach pain). [provider] Taking Active Multiple Informants  doxepin (SINEQUAN) 10 MG capsule [id number] No Take 10 mg by mouth at bedtime. [provider] Taking Active Multiple Informants  famotidine (PEPCID) 20 MG tablet [id number]  No Take by mouth. [provider] Taking Active   Ferrous Sulfate ([facility] SLOW RELEASE IRON PO) [id number] No Take 1 tablet by mouth daily. [provider] Taking Active   fluticasone (FLONASE) 50 MCG/ACT nasal spray [id number] No SPRAY 1 SPRAY INTO BOTH NOSTRILS DAILY. Hali Marry, MD Taking Active   glipiZIDE (GLUCOTROL) 10 MG tablet [id number] No TAKE 1 TABLET BY MOUTH TWICE A DAY Hali Marry, MD Taking Active   glucose blood (ACCU-CHEK GUIDE) test strip [id number] No CHECK BLOOD SUGAR 3 TIMES DAILY. DX: E11.9 Hali Marry, MD Taking Active   hydrochlorothiazide (HYDRODIURIL) 25 MG tablet [id number] No TAKE 1 TABLET (25 MG TOTAL) BY MOUTH DAILY AS NEEDED. FOR SWELLING Hali Marry, MD Taking Active   JARDIANCE 25 MG TABS tablet [id number] No TAKE 25 MG BY MOUTH DAILY. Hali Marry, MD Taking Active   Krill Oil 500 MG CAPS [id number] No Take 500 mg by mouth daily. [provider] Taking Active Multiple Informants           Med Note Arbutus Ped Jan 22, 2021  8:48 AM)    LANTUS SOLOSTAR 100 UNIT/ML Solostar Pen [id number] No INJECT 36 UNITS INTO THE SKIN AT BEDTIME AS NEEDED (HIGH BLOOD SUGAR). Hali Marry, MD Taking Active   levocetirizine Harlow Ohms) 5 MG tablet [id number] No TAKE 1 TABLET BY MOUTH EVERY DAY IN THE Erskine Emery, MD Taking Active   Multiple Vitamin (MULTIVITAMIN WITH MINERALS) TABS tablet [id number] No Take 1 tablet by mouth daily. [provider] Taking  Active Multiple Informants           Med Note Arbutus Ped Jan 22, 2021  8:48 AM)    OVER THE COUNTER MEDICATION [id number] No Take 1 tablet by mouth 3 (three) times daily. Golo otc weight loss supplement [provider] Taking Active Multiple Informants           Med Note Maryruth Eve, Leanna Battles   Thu Jan 22, 2021  8:48 AM)    pregabalin (LYRICA) 50 MG capsule [id number] No Take 1 capsule (50 mg total) by mouth 2 (two) times daily. Hali Marry, MD Taking Active   sucralfate (CARAFATE) 1 g tablet [id number] No TAKE 1 TABLET (1 G TOTAL) BY MOUTH 4 TIMES A DAY WITH MEALS AND AT BEDTIME Hali Marry, MD Taking Active             Patient Active Problem List   Diagnosis Date Noted   Left hip pain 02/23/2021   Low back pain 02/23/2021   Gait abnormality 02/23/2021   Bilateral lower extremity edema 01/22/2021   Elevated CK 10/12/2020   Spinal stenosis 10/09/2020   Lumbar adjacent segment disease with spondylolisthesis 10/09/2020   Spinal stenosis, lumbar region with neurogenic claudication 04/16/2020   Peripheral neuropathy 11/28/2019   Paresthesia and pain of both upper extremities 11/12/2019   Chronic midline low back pain with bilateral sciatica 10/30/2019   Left thigh pain 10/30/2019   Numbness 10/30/2019   Meralgia paresthetica, left 09/12/2019   Tennis Must Quervain's disease (tenosynovitis) 02/06/2019   Acute bilateral low back pain without sciatica 12/01/2018   Orthostatic hypotension 12/01/2018   Failed back syndrome of lumbar spine 11/25/2017   Inflammation of sacroiliac joint ([place]) 06/07/2017   Displacement of lumbar intervertebral disc 11/22/2016   Fatty liver 09/27/2016   Status post bilateral breast reduction 03/23/2016   Hypertrophy of breast 08/12/2015   Spondylolisthesis of  lumbar region 06/13/2015   History of lumbar fusion 04/07/2015   Radiculopathy, lumbar region 04/07/2015   Essential hypertension 02/14/2015   Postprandial vomiting  02/04/2014   Obesity (BMI 30-39.9) 11/01/2013   Carotid artery stenosis 06/07/2013   Hereditary and idiopathic peripheral neuropathy 06/07/2013   History of migraine 05/04/2013   Type 2 diabetes mellitus with other specified complication ([place]) 54/36/0677   SVT (supraventricular tachycardia) ([place]) 11/03/2010   GERD (gastroesophageal reflux disease) 10/29/2010   Chronic low back pain 10/29/2010   Hyperlipidemia associated with type 2 diabetes mellitus ([place]) 10/29/2010    Immunization History  Administered Date(s) Administered   Fluad Quad(high Dose 65+) 12/25/2020   Influenza Split 03/02/2011, 02/25/2012   Influenza, High Dose Seasonal PF 06/06/2017, 12/25/2017, 12/19/2018, 12/13/2019   Influenza,inj,Quad PF,6+ Mos 02/13/2015, 12/23/2015, 12/22/2016   Influenza-Unspecified 01/24/2013, 01/21/2014, 02/26/2015   Moderna Sars-Covid-2 Vaccination 06/08/2019, 07/06/2019   PFIZER(Purple Top)SARS-COV-2 Vaccination 03/25/2020   PNEUMOCOCCAL CONJUGATE-20 02/05/2021   PPD Test 02/16/2017   Pfizer Covid-19 Vaccine Bivalent Booster 50yr & up 07/05/2021   Pneumococcal Conjugate-13 02/14/2015   Pneumococcal Polysaccharide-23 05/12/2005, 01/17/2018, 07/15/2020   Td 07/07/2019   Tdap 04/27/2007, 07/30/2017, 11/16/2019   Zoster Recombinat (Shingrix) 04/01/2017, 08/10/2017    Conditions to be addressed/monitored: HTN, HLD, and DMII  There are no care plans that you recently modified to display for this patient.       Medication Assistance: Application for novonordisk  medication assistance program. in process.  Anticipated assistance start date TBD.  See plan of care for additional detail.  Patient[facility]  [id number] [id number]: [phone]: [phone] pill box? Yes Pt endorses 80% compliance  Follow Up:  Patient agrees to Care Plan and  Follow-up.  Plan: Telephone follow up appointment with care management team member scheduled for:  3 days  KeLarinda ButteryPharmD Clinical Pharmacist [phone]

## 2022-01-07 NOTE — Patient Instructions (Signed)
Visit Information  Thank you for taking time to visit with me today. Please don't hesitate to contact me if I can be of assistance to you before our next scheduled telephone appointment.  Following are the goals we discussed today:   Patient Goals/Self-Care Activities Over the next 3 days, patient will:  take medications as prescribed, check glucose daily, document, and provide at future appointments, and collaborate with provider on medication access solutions  Follow Up Plan: Telephone follow up appointment with care management team member scheduled for: 3 days  Please call the care guide team at [phone] if you need to cancel or reschedule your appointment.    The patient verbalized understanding of instructions, educational materials, and care plan provided today and agreed to receive a mailed copy of patient instructions, educational materials, and care plan.   Darius Bump

## 2022-01-08 ENCOUNTER — Ambulatory Visit (INDEPENDENT_AMBULATORY_CARE_PROVIDER_SITE_OTHER): Payer: Medicare Other | Admitting: Podiatry

## 2022-01-08 ENCOUNTER — Encounter: Payer: Self-pay | Admitting: Podiatry

## 2022-01-08 ENCOUNTER — Ambulatory Visit: Payer: Medicare Other | Admitting: Pharmacist

## 2022-01-08 DIAGNOSIS — E1169 Type 2 diabetes mellitus with other specified complication: Secondary | ICD-10-CM

## 2022-01-08 DIAGNOSIS — I10 Essential (primary) hypertension: Secondary | ICD-10-CM

## 2022-01-08 DIAGNOSIS — B351 Tinea unguium: Secondary | ICD-10-CM

## 2022-01-08 DIAGNOSIS — L84 Corns and callosities: Secondary | ICD-10-CM | POA: Diagnosis not present

## 2022-01-08 DIAGNOSIS — Z794 Long term (current) use of insulin: Secondary | ICD-10-CM

## 2022-01-08 NOTE — Patient Instructions (Signed)
Visit Information  Thank you for taking time to visit with me today. Please don't hesitate to contact me if I can be of assistance to you before our next scheduled telephone appointment.  Following are the goals we discussed today:   Patient Goals/Self-Care Activities Over the next 30 days, patient will:  take medications as prescribed, check glucose daily, document, and provide at future appointments, and collaborate with provider on medication access solutions  Follow Up Plan: Telephone follow up appointment with care management team member scheduled for: 3-4 weeks  Please call the care guide team at [phone] if you need to cancel or reschedule your appointment.    The patient verbalized understanding of instructions, educational materials, and care plan provided today and agreed to receive a mailed copy of patient instructions, educational materials, and care plan.   Amanda Morris
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
## 2022-01-08 NOTE — Progress Notes (Signed)
Chronic Care Management Pharmacy Note  01/08/2022 Name:  Amanda Morris MRN:  [id number] DOB:  12-Mar-1951  Summary: addressed DM, HTN, HLD. She is receiving ozempic via patient assistance, began 0.12m weekly on 12/30/21, and due to hypoglycemia, completed a trial period the past several days off of insulin entirely.  BG readings while completely off insulin: 83, 81, 83, 74, 52, 89, 148, 92, 66, 65, 96  Recommendations/Changes made from today's visit:  - Due to consistently lower BG values, recommend also decreasing glipizide to 1/2 tablet BID, as of today's visit. Updated med list to reflect. - Goal: increase ozempic to 0.544mweekly on 01/27/22, stop glipizide entirely.   Plan: f/u with patient in 3-4 weeks  Subjective: Amanda Morris an [age].o. year old female who is a primary patient of Metheney, CaRene KocherMD.  The CCM team was consulted for assistance with disease management and care coordination needs.    Engaged with patient by telephone for follow up visit in response to provider referral for pharmacy case management and/or care coordination services.   Consent to Services:  The patient was given the following information about Chronic Care Management services today, agreed to services, and gave verbal consent: 1. CCM service includes personalized support from designated clinical staff supervised by the primary care provider, including individualized plan of care and coordination with other care providers 2. 24/7 contact phone numbers for assistance for urgent and routine care needs. 3. Service will only be billed when office clinical staff spend 20 minutes or more in a month to coordinate care. 4. Only one practitioner may furnish and bill the service in a calendar month. 5.The patient may stop CCM services at any time (effective at the end of the month) by phone call to the office staff. 6. The patient will be responsible for cost sharing (co-pay) of up to 20% of the  service fee (after annual deductible is met). Patient agreed to services and consent obtained.  Patient Care Team: MeHali MarryMD as PCP - General (Family Medicine) WeEulis CannerMD (Gastroenterology) ThSilverio DecampMD as Consulting Physician (Family Medicine) KlDarius BumpRPFirsthealth [facility] TreatmentPharmacist)  Recent office visits:  06/08/21-Catherine D. MeMadilyn FiremanMD (PCP) Seen for new referral for 2nd opinion for back surgery. Increase insulin to 28 units nightly for 1 week.  [facility]. 04/23/21-[name], MD (PCP) Seen for a diabetic follow up visit and Gastroesophageal Reflux. Ambulatory referral to Gastroenterology. Increase her Jardiance back to a whole tab daily and then decrease her insulin by 2 units. Labs ordered. Follow up in 3 months.  01/26/21-Catherine D. MeMadilyn FiremanMD (PCP) Medicare annual wellness visit. Follow up in 1 year.  01/22/21-Catherine D. MeMadilyn FiremanMD (PCP) Seen for follow up visit. Decrease glipizide to 5 mg twice daily. Labs ordered. Follow up in 3 months. 12/25/20-Catherine D. MeMadilyn FiremanMD (PCP) Seen for foot pain. Labs ordered. Flu vaccine given. Start over the counter Iron supplement. Start on Hydrochlorothiazide 25 mg. Follow up in 4 weeks.    Recent consult visits:  05/25/21-Karl A. Pleasant, PA (Gastroenterology) Seen for abdominal pain.  03/10/21-Kyle L. CaChristella NoaMD (Neurosurgery) Notes not available. 02/23/21-Yijun YaKrista BlueMD (Neurology) Follow up visit.  01/26/21-Karl A. Pleasant, PA (Gastroenterology) Labs ordered. Follow up in 3 months. 12/30/20-Matthew R. WaJacqualyn PoseyDPM (Podiatry) Sen for nail problem. ABI ordered. Follow up in 3 months.   Hospital visits:  None in previous 6 months  Objective:  Lab Results  Component Value Date   CREATININE 0.80 07/21/2021   CREATININE 0.73 12/25/2020   CREATININE 0.76 11/18/2020    Lab Results  Component Value Date   HGBA1C  6.5 (A) 10/20/2021   Last diabetic Eye exam:  Lab Results  Component Value Date/Time   HMDIABEYEEXA No Retinopathy 11/02/2018 12:00 AM    Last diabetic Foot exam: No results found for: "HMDIABFOOTEX"      Component Value Date/Time   CHOL 120 07/21/2021 0000   TRIG 91 07/21/2021 0000   HDL 45 (L) 07/21/2021 0000   CHOLHDL 2.7 07/21/2021 0000   VLDL 20 10/13/2020 1759   LDLCALC 58 07/21/2021 0000       Latest Ref Rng & Units 12/25/2020   12:00 AM 10/15/2020    3:43 AM 10/14/2020    3:58 AM  Hepatic Function  Total Protein 6.1 - 8.1 g/dL 7.3  5.2  5.5   Albumin 3.5 - 5.0 g/dL  2.4  2.4   AST 10 - 35 U/L 18  24  29    ALT 6 - 29 U/L 16  22  24    Alk Phosphatase 38 - 126 U/L  52  50   Total Bilirubin 0.2 - 1.2 mg/dL 0.3  0.1  0.6     Lab Results  Component Value Date/Time   TSH 1.73 12/25/2020 12:00 AM   TSH 1.650 11/28/2019 08:54 AM       Latest Ref Rng & Units 12/25/2020   12:00 AM 11/18/2020   12:00 AM 10/15/2020    3:43 AM  CBC  WBC 3.8 - 10.8 Thousand/uL 12.9  10.2  8.2   Hemoglobin 11.7 - 15.5 g/dL 11.3  11.1  9.2   Hematocrit 35.0 - 45.0 % 39.7  38.5  29.7   Platelets 140 - 400 Thousand/uL 350  429  233     Lab Results  Component Value Date/Time   VD25OH 33.5 11/28/2019 08:54 AM    Social History   Tobacco Use  Smoking Status Former   Types: Cigarettes   Quit date: 07/30/2007   Years since quitting: 14.4  Smokeless Tobacco Never   BP Readings from Last 3 Encounters:  10/20/21 121/79  07/21/21 (!) 104/56  06/08/21 115/67   Pulse Readings from Last 3 Encounters:  10/20/21 96  07/21/21 84  06/08/21 94   Wt Readings from Last 3 Encounters:  10/20/21 203 lb (92.1 kg)  07/21/21 213 lb (96.6 kg)  06/08/21 214 lb (97.1 kg)    Assessment: Review of patient past medical history, allergies, medications, health status, including review of consultants reports, laboratory and other test data, was performed as part of comprehensive evaluation and provision of  chronic care management services.   SDOH:  (Social Determinants of Health) assessments and interventions performed:  SDOH Interventions    Flowsheet Row Office Visit from 01/26/2021 in [place] Most recent reading at 01/26/2021  4:13 PM Office Visit from 02/05/2019 in [place] Most recent reading at 02/05/2019 11:27 AM Clinical Support from 07/11/2018 in [place] Most recent reading at 07/11/2018  2:12 PM Office Visit from 07/11/2018 in [place] Most recent reading at 07/11/2018  9:10 AM  SDOH Interventions      Food Insecurity Interventions Intervention Not Indicated -- -- --  Housing Interventions Intervention Not Indicated -- -- --  Transportation Interventions Intervention Not Indicated -- -- --  Depression Interventions/Treatment  -- Currently on Treatment Medication Currently on Treatment  Financial Strain Interventions Intervention Not Indicated -- -- --  Physical Activity Interventions Intervention Not Indicated -- -- --  Stress Interventions Intervention Not Indicated -- -- --  Social Connections Interventions Intervention Not Indicated -- -- --       CCM Care Plan  Allergies  Allergen Reactions   Penicillins Hives and Other (See Comments)    PATIENT HAS HAD A PCN REACTION WITH IMMEDIATE RASH, FACIAL/TONGUE/THROAT SWELLING, SOB, OR LIGHTHEADEDNESS WITH HYPOTENSION:  #  #  YES  #  #  Has patient had a PCN reaction causing severe rash involving mucus membranes or skin necrosis: No Has patient had a PCN reaction that required hospitalization: No Has patient had a PCN reaction occurring within the last 10 years: No If all of the above answers are "NO", then may proceed with Cephalosporin use.    Duloxetine Other (See Comments)    GI upset   Tramadol Other (See Comments)    Nervousness, causes hands to shake   Aspirin Nausea  Only   Codeine Itching   Hydrocodone Nausea Only and Other (See Comments)    Nose bleed   Metformin And Related Diarrhea and Nausea Only   Oxycodone Nausea And Vomiting    GI Upset (intolerance)   Oxycodone-Acetaminophen Nausea Only   Victoza [Liraglutide] Other (See Comments)    Abdominal pain    Medications Reviewed Today     Reviewed by Darius Bump, [facility] (Pharmacist) on 01/08/22 at 1531  Med List Status: <None>   Medication Order Taking? Sig Documenting Provider Last Dose Status Informant  Accu-Chek Softclix Lancets lancets [id number] Yes DX:E11.9 USE AS DIRECTED Hali Marry, MD Taking Active   acetaminophen (TYLENOL) 650 MG CR tablet [id number] Yes Take 650 mg by mouth every 8 (eight) hours as needed for pain. [provider] Taking Active   aspirin EC 81 MG tablet [id number] Yes Take 1 tablet (81 mg total) by mouth daily. Traci Sermon, PA-C Taking Active Multiple Informants           Med Note Maryruth Eve, Leanna Battles   Thu Dec 25, 2020 10:27 AM)    atorvastatin (LIPITOR) 40 MG tablet [id number] Yes Take 1 tablet (40 mg total) by mouth at bedtime. Hali Marry, MD Taking Active   B-D ULTRAFINE III SHORT PEN 31G X 8 MM MISC [id number]  USE AS DIRECTED Hali Marry, MD  Active   Blood Glucose Monitoring Suppl (ACCU-CHEK AVIVA PLUS) w/Device KIT [id number] Yes Check blood sugars 3 times daily DX:E11.9 Hali Marry, MD Taking Active Multiple Informants  cholecalciferol (VITAMIN D) 1000 units tablet [id number] Yes Take 1,000 Units by mouth daily. [provider] Taking Active Multiple Informants  dexlansoprazole (DEXILANT) 60 MG capsule [id number] Yes Take 60 mg by mouth daily. [provider] Taking Active Multiple Informants  dicyclomine (BENTYL) 20 MG tablet [id number] Yes Take 20 mg by mouth every 6 (six) hours as needed for spasms (cramps/stomach pain). [provider] Taking Active Multiple Informants  doxepin  (SINEQUAN) 10 MG capsule [id number] Yes Take 10 mg by mouth at bedtime. [provider] Taking Active Multiple Informants  famotidine (PEPCID) 20 MG tablet [id number] Yes Take by mouth. [provider] Taking Active   Ferrous Sulfate ([facility] SLOW RELEASE IRON PO) [id number] Yes Take 1 tablet by mouth daily. [provider] Taking Active   fluticasone (FLONASE) 50 MCG/ACT nasal spray [id number] Yes SPRAY 1 SPRAY INTO  BOTH NOSTRILS DAILY. Hali Marry, MD Taking Active   glipiZIDE (GLUCOTROL) 10 MG tablet [id number] Yes TAKE 1 TABLET BY MOUTH TWICE A DAY  Patient taking differently: Take 5 mg by mouth in the morning and at bedtime.   Hali Marry, MD Taking Active            Med Note Darius Bump   Fri Jan 08, 2022  3:31 PM) Take 1/2 tablet BID as of 01/08/22 CCM pharmacy visit  glucose blood (ACCU-CHEK GUIDE) test strip [id number]  CHECK BLOOD SUGAR 3 TIMES DAILY. DX: E11.9 Hali Marry, MD  Active   hydrochlorothiazide (HYDRODIURIL) 25 MG tablet [id number] Yes TAKE 1 TABLET (25 MG TOTAL) BY MOUTH DAILY AS NEEDED. FOR SWELLING Hali Marry, MD Taking Active   JARDIANCE 25 MG TABS tablet [id number] Yes TAKE 25 MG BY MOUTH DAILY. Hali Marry, MD Taking Active   Krill Oil 500 MG CAPS [id number] Yes Take 500 mg by mouth daily. [provider] Taking Active Multiple Informants           Med Note Arbutus Ped Jan 22, 2021  8:48 AM)    levocetirizine (XYZAL) 5 MG tablet [id number] Yes TAKE 1 TABLET BY MOUTH EVERY DAY IN THE Erskine Emery, MD Taking Active   Multiple Vitamin (MULTIVITAMIN WITH MINERALS) TABS tablet [id number] Yes Take 1 tablet by mouth daily. [provider] Taking Active Multiple Informants           Med Note Arbutus Ped Jan 22, 2021  8:48 AM)    OVER THE COUNTER MEDICATION [id number] Yes Take 1 tablet by mouth 3 (three) times daily. Golo otc weight loss supplement  [provider] Taking Active Multiple Informants           Med Note Arbutus Ped Jan 22, 2021  8:48 AM)    pregabalin (LYRICA) 50 MG capsule [id number] Yes TAKE 1 CAPSULE BY MOUTH 2 TIMES DAILY. Hali Marry, MD Taking Active   sucralfate (CARAFATE) 1 g tablet [id number] Yes TAKE 1 TABLET (1 G TOTAL) BY MOUTH 4 TIMES A DAY WITH MEALS AND AT BEDTIME Hali Marry, MD Taking Active             Patient Active Problem List   Diagnosis Date Noted   Left hip pain 02/23/2021   Low back pain 02/23/2021   Gait abnormality 02/23/2021   Bilateral lower extremity edema 01/22/2021   Elevated CK 10/12/2020   Spinal stenosis 10/09/2020   Lumbar adjacent segment disease with spondylolisthesis 10/09/2020   Spinal stenosis, lumbar region with neurogenic claudication 04/16/2020   Peripheral neuropathy 11/28/2019   Paresthesia and pain of both upper extremities 11/12/2019   Chronic midline low back pain with bilateral sciatica 10/30/2019   Left thigh pain 10/30/2019   Numbness 10/30/2019   Meralgia paresthetica, left 09/12/2019   T[place] Must Quervain's disease (tenosynovitis) 02/06/2019   Acute bilateral low back pain without sciatica 12/01/2018   Orthostatic hypotension 12/01/2018   Failed back syndrome of lumbar spine 11/25/2017   Inflammation of sacroiliac joint ([place]) 06/07/2017   Displacement of lumbar intervertebral disc 11/22/2016   Fatty liver 09/27/2016   Status post bilateral breast reduction 03/23/2016   Hypertrophy of breast 08/12/2015   Spondylolisthesis of lumbar region 06/13/2015   History of lumbar fusion 04/07/2015   Radiculopathy, lumbar region 04/07/2015   Essential hypertension 02/14/2015   Postprandial vomiting 02/04/2014  Obesity (BMI 30-39.9) 11/01/2013   Carotid artery stenosis 06/07/2013   Hereditary and idiopathic peripheral neuropathy 06/07/2013   History of migraine 05/04/2013   Type 2 diabetes mellitus with other specified complication  ([place]) 16/01/9603   SVT (supraventricular tachycardia) ([place]) 11/03/2010   GERD (gastroesophageal reflux disease) 10/29/2010   Chronic low back pain 10/29/2010   Hyperlipidemia associated with type 2 diabetes mellitus ([place]) 10/29/2010    Immunization History  Administered Date(s) Administered   Fluad Quad(high Dose 65+) 12/25/2020, 01/04/2022   Influenza Split 03/02/2011, 02/25/2012   Influenza, High Dose Seasonal PF 06/06/2017, 12/25/2017, 12/19/2018, 12/13/2019   Influenza,inj,Quad PF,6+ Mos 02/13/2015, 12/23/2015, 12/22/2016   Influenza-Unspecified 01/24/2013, 01/21/2014, 02/26/2015   Moderna Sars-Covid-2 Vaccination 06/08/2019, 07/06/2019   PFIZER(Purple Top)SARS-COV-2 Vaccination 03/25/2020   PNEUMOCOCCAL CONJUGATE-20 02/05/2021   PPD Test 02/16/2017   Pfizer Covid-19 Vaccine Bivalent Booster 12yr & up 07/05/2021   Pneumococcal Conjugate-13 02/14/2015   Pneumococcal Polysaccharide-23 05/12/2005, 01/17/2018, 07/15/2020   Td 07/07/2019   Tdap 04/27/2007, 07/30/2017, 11/16/2019   Zoster Recombinat (Shingrix) 04/01/2017, 08/10/2017    Conditions to be addressed/monitored: HTN, HLD, and DMII  Care Plan : Medication Management  Updates made by KDarius Bump RMountain Pinesince 01/08/2022 12:00 AM     Problem: DM, HTN, HLD      Long-Range Goal: Disease Progression Prevention   Start Date: 06/15/2021  Recent Progress: On track  Priority: High  Note:   Current Barriers:  Suboptimal therapeutic regimen for diabetes  Pharmacist Clinical Goal(s):  Over the next 30 days, patient will adhere to plan to optimize therapeutic regimen for diabetes as evidenced by report of adherence to recommended medication management changes through collaboration with PharmD and provider.   Interventions: 1:1 collaboration with MHali Marry MD regarding development and update of comprehensive plan of care as evidenced by provider attestation and co-signature Inter-disciplinary care team  collaboration (see longitudinal plan of care) Comprehensive medication review performed; medication list updated in electronic medical record  Diabetes:   Controlled; current treatment: glipizide 150mBID, jardiance 2561maily; ozempic 0.64m29mekly  Current glucose readings: checking AM, 11am, and 3-4PM.  fasting glucose: 60-70s  Denies hypoglycemic/hyperglycemic symptoms  Current meal patterns: eats breakfast, skips lunch, then 3-4PM for supper  breakfast: crackers, or sausage egg cheese biscuit; lunch: skips; dinner: mashed potatoes, baked beans, tur[place]wich; snacks: protein shake, ; drinks: pineapple peach fruit drink, diet ginger ale, water  Current exercise: ambulates throughout the day  Educated on goals of diabetes care, glucose values and a1c, as well as mechanism of action of various medicines used to control glucose. Previously counseled on ozempic dosing, administration technique, and benefits of switching to GLP1 therapy Recommended continue ozempic 0.64mg75mkly, reduce glipizide to 1/2 tablet BID (= Glipizide 5mg B81m. Hypertension:  Controlled; current treatment: hydrochlorothiazide 64mg d76m;   Current home readings: not currently checking  Denies hypotensive/hypertensive symptoms  Recommended continue current regimen, Hyperlipidemia:  Controlled; current treatment:atorvastatin 40mg da50m   Recommended continue current regimen   Patient Goals/Self-Care Activities Over the next 30 days, patient will:  take medications as prescribed, check glucose daily, document, and provide at future appointments, and collaborate with provider on medication access solutions  Follow Up Plan: Telephone follow up appointment with care management team member scheduled for: 3-4 weeks         Medication Assistance: Application for novonordisk  medication assistance program. in process.  Anticipated assistance start date TBD.  See plan of care for additional detail.  Patient's  [facility] pharmacy [facility]  [id number] [address]Phone: [phone]: [phone] Uses pill box? Yes Pt endorses 80% compliance  Follow Up:  Patient agrees to Care Plan and Follow-up.  Plan: Telephone follow up appointment with care management team member scheduled for:  3-4 weeks  KLarinda Buttery PharmD Clinical Pharmacist [phone]

## 2022-01-12 DIAGNOSIS — M5386 Other specified dorsopathies, lumbar region: Secondary | ICD-10-CM | POA: Diagnosis not present

## 2022-01-12 DIAGNOSIS — M545 Low back pain, unspecified: Secondary | ICD-10-CM | POA: Diagnosis not present

## 2022-01-12 DIAGNOSIS — R293 Abnormal posture: Secondary | ICD-10-CM | POA: Diagnosis not present

## 2022-01-12 DIAGNOSIS — M549 Dorsalgia, unspecified: Secondary | ICD-10-CM | POA: Diagnosis not present

## 2022-01-12 DIAGNOSIS — R2689 Other abnormalities of gait and mobility: Secondary | ICD-10-CM | POA: Diagnosis not present

## 2022-01-13 DIAGNOSIS — M5417 Radiculopathy, lumbosacral region: Secondary | ICD-10-CM | POA: Diagnosis not present

## 2022-01-20 ENCOUNTER — Encounter: Payer: Self-pay | Admitting: Family Medicine

## 2022-01-20 ENCOUNTER — Ambulatory Visit (INDEPENDENT_AMBULATORY_CARE_PROVIDER_SITE_OTHER): Payer: Medicare Other | Admitting: Family Medicine

## 2022-01-20 VITALS — BP 109/73 | HR 90 | Ht 64.0 in | Wt 190.0 lb

## 2022-01-20 DIAGNOSIS — Z794 Long term (current) use of insulin: Secondary | ICD-10-CM | POA: Diagnosis not present

## 2022-01-20 DIAGNOSIS — I10 Essential (primary) hypertension: Secondary | ICD-10-CM | POA: Diagnosis not present

## 2022-01-20 DIAGNOSIS — G6289 Other specified polyneuropathies: Secondary | ICD-10-CM | POA: Diagnosis not present

## 2022-01-20 DIAGNOSIS — R200 Anesthesia of skin: Secondary | ICD-10-CM | POA: Diagnosis not present

## 2022-01-20 DIAGNOSIS — E1169 Type 2 diabetes mellitus with other specified complication: Secondary | ICD-10-CM | POA: Diagnosis not present

## 2022-01-20 DIAGNOSIS — Z23 Encounter for immunization: Secondary | ICD-10-CM | POA: Diagnosis not present

## 2022-01-20 LAB — POCT GLYCOSYLATED HEMOGLOBIN (HGB A1C): Hemoglobin A1C: 5.7 % — AB (ref 4.0–5.6)

## 2022-01-20 MED ORDER — PREGABALIN 75 MG PO CAPS: 75.0000 mg | ORAL_CAPSULE | Freq: Two times a day (BID) | ORAL | 2 refills | Status: DC

## 2022-01-20 MED ORDER — GLIPIZIDE 5 MG PO TABS: 5.0000 mg | ORAL_TABLET | Freq: Two times a day (BID) | ORAL | 1 refills | Status: DC

## 2022-01-20 NOTE — Patient Instructions (Addendum)
Postmeal sugars to be under 180. For new prescription for the 5 mg glipizide. Having low blood sugars then please skip the tab. Try increasing the Lyrica to 75 mg and see if you feel like it is more helpful for your pain. Placed a neurology referral if you do not hear back in a couple weeks please let us know. Please stop the GoLo supplement while you are on the Ozempic shot.  Stop your HCTZ since your blood pressure is a little low.   Please call me with the nausea medication you are using.

## 2022-01-20 NOTE — Assessment & Plan Note (Signed)
Try to work through the nausea that she is experiencing with the [place].  She has nausea medicine at home but does not seem to be helping we could certainly try something else but she will have to call me back and let me know what it is.  In the meantime I really really want her to take a lower dose of glipizide  Postmeal sugars to be under 180. For new prescription for the 5 mg glipizide. Having low blood sugars then please skip the tab.

## 2022-01-20 NOTE — Assessment & Plan Note (Signed)
  Try increasing the Lyrica to 75 mg and see if you feel like it is more helpful for your pain. Placed a neurology referral if you do not hear back in a couple weeks please let us know.

## 2022-01-20 NOTE — Assessment & Plan Note (Signed)
Pressure little on the low end.  We will stop hydrochlorothiazide.

## 2022-01-20 NOTE — Progress Notes (Signed)
Established Patient Office Visit  Subjective   Patient ID: Amanda Morris, female    DOB: [birth year]  Age: 71 y.o. MRN: [id number]  Chief Complaint  Patient presents with   Diabetes    HPI  Diabetes - no hypoglycemic events. No wounds or sores that are not healing well. No increased thirst or urination. Checking glucose at home. Taking medications as prescribed without any side effects. Glipizide dec to 1/2 tab about 1 week ago.  She is also lost 13 pounds since she was last here.  She brought in her glucose log.  Some of her post meals are still in the 90s.  She did not cut her glipizide in half.  She is having significant nausea with the Ozempic.  She says she has a nausea tab at home that she has been taking 3 times a day but says it does not really help.  She also still has persistent pain and numbness down both arms.  She has been on Lyrica for probably over a year I took over the prescription at some point she says that 50 mg twice a day is really not helping her she says she notices it is worse especially if she is driving.  She has never had any further work-up with neurology or nerve conduction studies etc.  Hypertension- Pt denies chest pain, SOB, dizziness, or heart palpitations.  Taking meds as directed w/o problems.  Denies medication side effects.        ROS    Objective:     BP 109/73   Pulse 90   Ht '5\' 4"'$  (1.626 m)   Wt 190 lb (86.2 kg)   LMP  (LMP Unknown)   SpO2 97%   BMI 32.61 kg/m    Physical Exam Vitals and nursing note reviewed.  Constitutional:      Appearance: She is well-developed.  HENT:     Head: Normocephalic and atraumatic.  Cardiovascular:     Rate and Rhythm: Normal rate and regular rhythm.     Heart sounds: Normal heart sounds.  Pulmonary:     Effort: Pulmonary effort is normal.     Breath sounds: Normal breath sounds.  Skin:    General: Skin is warm and dry.  Neurological:     Mental Status: She is alert and oriented to  person, place, and time.  Psychiatric:        Behavior: Behavior normal.      Results for orders placed or performed in visit on 01/20/22  POCT glycosylated hemoglobin (Hb A1C)  Result Value Ref Range   Hemoglobin A1C 5.7 (A) 4.0 - 5.6 %   HbA1c POC (<> result, manual entry)     HbA1c, POC (prediabetic range)     HbA1c, POC (controlled diabetic range)        The ASCVD Risk score (Arnett DK, et al., 2019) failed to calculate for the following reasons:   The valid total cholesterol range is 130 to 320 mg/dL    Assessment & Plan:   Problem List Items Addressed This Visit       Cardiovascular and Mediastinum   Essential hypertension    Pressure little on the low end.  We will stop hydrochlorothiazide.      Relevant Orders   POCT glycosylated hemoglobin (Hb A1C) (Completed)   COMPLETE METABOLIC PANEL WITH GFR     Endocrine   Type 2 diabetes mellitus with other specified complication ([place]) - Primary    Try to work through  the nausea that she is experiencing with the Ozempic.  She has nausea medicine at home but does not seem to be helping we could certainly try something else but she will have to call me back and let me know what it is.  In the meantime I really really want her to take a lower dose of glipizide  Postmeal sugars to be under 180. For new prescription for the 5 mg glipizide. Having low blood sugars then please skip the tab.      Relevant Medications   glipiZIDE (GLUCOTROL) 5 MG tablet   Semaglutide (OZEMPIC, 0.25 OR 0.5 MG/DOSE, )   Other Relevant Orders   POCT glycosylated hemoglobin (Hb A1C) (Completed)   COMPLETE METABOLIC PANEL WITH GFR   Ambulatory referral to Neurology     Nervous and Auditory   Peripheral neuropathy     Try increasing the Lyrica to 75 mg and see if you feel like it is more helpful for your pain. Placed a neurology referral if you do not hear back in a couple weeks please let us know.      Relevant Medications   pregabalin  (LYRICA) 75 MG capsule   Other Relevant Orders   Ambulatory referral to Neurology   Other Visit Diagnoses     Need for immunization against influenza       Relevant Orders   Flu Vaccine QUAD High Dose(Fluad) (Completed)   Arm numbness       Relevant Orders   Ambulatory referral to Neurology       Return in about 3 months (around 04/21/2022) for dm.    Beatrice Lecher, MD

## 2022-01-21 ENCOUNTER — Other Ambulatory Visit: Payer: Self-pay

## 2022-01-21 DIAGNOSIS — E876 Hypokalemia: Secondary | ICD-10-CM

## 2022-01-21 LAB — COMPLETE METABOLIC PANEL WITH GFR
AG Ratio: 1.2 (calc) (ref 1.0–2.5)
ALT: 35 U/L — ABNORMAL HIGH (ref 6–29)
AST: 38 U/L — ABNORMAL HIGH (ref 10–35)
Albumin: 4 g/dL (ref 3.6–5.1)
Alkaline phosphatase (APISO): 97 U/L (ref 37–153)
BUN/Creatinine Ratio: 6 (calc) (ref 6–22)
BUN: 5 mg/dL — ABNORMAL LOW (ref 7–25)
CO2: 29 mmol/L (ref 20–32)
Calcium: 9.3 mg/dL (ref 8.6–10.4)
Chloride: 101 mmol/L (ref 98–110)
Creat: 0.83 mg/dL (ref 0.50–1.05)
Globulin: 3.3 g/dL (calc) (ref 1.9–3.7)
Glucose, Bld: 87 mg/dL (ref 65–99)
Potassium: 3.2 mmol/L — ABNORMAL LOW (ref 3.5–5.3)
Sodium: 143 mmol/L (ref 135–146)
Total Bilirubin: 0.5 mg/dL (ref 0.2–1.2)
Total Protein: 7.3 g/dL (ref 6.1–8.1)
eGFR: 76 mL/min/{1.73_m2} (ref >=60)

## 2022-01-21 NOTE — Progress Notes (Signed)
Call patient: Potassium still low but it does look better than last time.  Continue to work on eating potassium rich foods.  Liver enzymes are just mildly elevated as well would like to recheck that again in a month.  They are just borderline and not in a worrisome range.  Definitely start the lower glipizide though.

## 2022-01-22 DIAGNOSIS — M5386 Other specified dorsopathies, lumbar region: Secondary | ICD-10-CM | POA: Diagnosis not present

## 2022-01-22 DIAGNOSIS — M549 Dorsalgia, unspecified: Secondary | ICD-10-CM | POA: Diagnosis not present

## 2022-01-22 DIAGNOSIS — R2689 Other abnormalities of gait and mobility: Secondary | ICD-10-CM | POA: Diagnosis not present

## 2022-01-22 DIAGNOSIS — R293 Abnormal posture: Secondary | ICD-10-CM | POA: Diagnosis not present

## 2022-01-22 DIAGNOSIS — M545 Low back pain, unspecified: Secondary | ICD-10-CM | POA: Diagnosis not present

## 2022-01-22 MED ORDER — ONDANSETRON HCL 4 MG PO TABS: 4.0000 mg | ORAL_TABLET | Freq: Three times a day (TID) | ORAL | 1 refills | Status: DC | PRN

## 2022-01-22 NOTE — Progress Notes (Signed)
OK, Zofran sent to pharmacy

## 2022-01-22 NOTE — Addendum Note (Signed)
Addended by: Beatrice Lecher D on: 01/22/2022 01:11 PM   Modules accepted: Orders

## 2022-01-23 DIAGNOSIS — E785 Hyperlipidemia, unspecified: Secondary | ICD-10-CM

## 2022-01-23 DIAGNOSIS — I10 Essential (primary) hypertension: Secondary | ICD-10-CM

## 2022-01-23 DIAGNOSIS — E1169 Type 2 diabetes mellitus with other specified complication: Secondary | ICD-10-CM

## 2022-01-23 DIAGNOSIS — Z794 Long term (current) use of insulin: Secondary | ICD-10-CM

## 2022-01-26 ENCOUNTER — Encounter: Payer: Self-pay | Admitting: Podiatrist

## 2022-01-26 ENCOUNTER — Other Ambulatory Visit: Payer: Self-pay | Admitting: Family Medicine

## 2022-01-26 DIAGNOSIS — M79604 Pain in right leg: Secondary | ICD-10-CM | POA: Diagnosis not present

## 2022-01-26 DIAGNOSIS — R600 Localized edema: Secondary | ICD-10-CM

## 2022-01-26 DIAGNOSIS — M79605 Pain in left leg: Secondary | ICD-10-CM | POA: Diagnosis not present

## 2022-01-26 DIAGNOSIS — S39012A Strain of muscle, fascia and tendon of lower back, initial encounter: Secondary | ICD-10-CM | POA: Diagnosis not present

## 2022-01-26 DIAGNOSIS — M549 Dorsalgia, unspecified: Secondary | ICD-10-CM | POA: Diagnosis not present

## 2022-01-28 DIAGNOSIS — M5416 Radiculopathy, lumbar region: Secondary | ICD-10-CM | POA: Diagnosis not present

## 2022-01-29 ENCOUNTER — Ambulatory Visit: Payer: Medicare Other | Admitting: Pharmacist

## 2022-01-29 DIAGNOSIS — R293 Abnormal posture: Secondary | ICD-10-CM | POA: Diagnosis not present

## 2022-01-29 DIAGNOSIS — I10 Essential (primary) hypertension: Secondary | ICD-10-CM

## 2022-01-29 DIAGNOSIS — Z723 Lack of physical exercise: Secondary | ICD-10-CM | POA: Diagnosis not present

## 2022-01-29 DIAGNOSIS — Z794 Long term (current) use of insulin: Secondary | ICD-10-CM

## 2022-01-29 DIAGNOSIS — R2689 Other abnormalities of gait and mobility: Secondary | ICD-10-CM | POA: Diagnosis not present

## 2022-01-29 DIAGNOSIS — Z981 Arthrodesis status: Secondary | ICD-10-CM | POA: Diagnosis not present

## 2022-01-29 DIAGNOSIS — M545 Low back pain, unspecified: Secondary | ICD-10-CM | POA: Diagnosis not present

## 2022-01-29 DIAGNOSIS — M5386 Other specified dorsopathies, lumbar region: Secondary | ICD-10-CM | POA: Diagnosis not present

## 2022-01-29 DIAGNOSIS — Z4789 Encounter for other orthopedic aftercare: Secondary | ICD-10-CM | POA: Diagnosis not present

## 2022-01-29 DIAGNOSIS — E1169 Type 2 diabetes mellitus with other specified complication: Secondary | ICD-10-CM

## 2022-01-29 NOTE — Progress Notes (Deleted)
Chronic Care Management Pharmacy Note  01/29/2022 Name:  Amanda Morris MRN:  [id number] DOB:  1951-01-10  Summary: addressed DM, HTN, HLD. She is receiving ozempic via patient assistance, began 0.17m weekly on 12/30/21. She has come entirely off insulin, and reducing her glipizide dosing.  She is taking 1/2 pill of glipizide 165mBID to use up her current pills. Leaving ozempic at 0.2526meekly per PCP. No other changes to her medication.  BG readings on glipizide 5mg17mD, ozempic 0.25mg52mkly, jardiance 25mg 30my: Lowest 86, 74, and highest is 167  Recommendations/Changes made from today's visit:  - No changes, continue current medication - Goal: increase ozempic to 0.5mg we44my (only if needed), stop glipizide entirely.   Plan: f/u with patient in 3-4 weeks  Subjective: Amanda Tuwanna Krausz71 y.o.44ear old female who is a primary patient of Metheney, CatheriRene KocherThe CCM team was consulted for assistance with disease management and care coordination needs.    Engaged with patient by telephone for follow up visit in response to provider referral for pharmacy case management and/or care coordination services.   Consent to Services:  The patient was given the following information about Chronic Care Management services today, agreed to services, and gave verbal consent: 1. CCM service includes personalized support from designated clinical staff supervised by the primary care provider, including individualized plan of care and coordination with other care providers 2. 24/7 contact phone numbers for assistance for urgent and routine care needs. 3. Service will only be billed when office clinical staff spend 20 minutes or more in a month to coordinate care. 4. Only one practitioner may furnish and bill the service in a calendar month. 5.The patient may stop CCM services at any time (effective at the end of the month) by phone call to the office staff. 6. The patient will  be responsible for cost sharing (co-pay) of up to 20% of the service fee (after annual deductible is met). Patient agreed to services and consent obtained.  Patient Care Team: MetheneHali Marry PCP - General (Family Medicine) Weeks, Eulis Cannerastroenterology) ThekkekSilverio Decamp Consulting Physician (Family Medicine) Irelynd Zumstein, Darius BumpPhThe [facility], Theacist)  Recent office visits:  06/08/21-Catherine D. MetheneMadilyn FiremanCP) Seen for new referral for 2nd opinion for back surgery. Increase insulin to 28 units nightly for 1 week.  [facility]. 04/23/21-[name], MD (PCP) Seen for a diabetic follow up visit and Gastroesophageal Reflux. Ambulatory referral to Gastroenterology. Increase her Jardiance back to a whole tab daily and then decrease her insulin by 2 units. Labs ordered. Follow up in 3 months.  01/26/21-Catherine D. MetheneMadilyn FiremanCP) Medicare annual wellness visit. Follow up in 1 year.  01/22/21-Catherine D. MetheneMadilyn FiremanCP) Seen for follow up visit. Decrease glipizide to 5 mg twice daily. Labs ordered. Follow up in 3 months. 12/25/20-Catherine D. MetheneMadilyn FiremanCP) Seen for foot pain. Labs ordered. Flu vaccine given. Start over the counter Iron supplement. Start on Hydrochlorothiazide 25 mg. Follow up in 4 weeks.    Recent consult visits:  05/25/21-Karl A. Pleasant, PA (Gastroenterology) Seen for abdominal pain.  03/10/21-Kyle L. CabbellChristella Noaeurosurgery) Notes not available. 02/23/21-Yijun Yan, MDKrista Blueeurology) Follow up visit.  01/26/21-Karl A. Pleasant, PA (Gastroenterology) Labs ordered. Follow up in 3 months. 12/30/20-Matthew R. WagonerJacqualyn PoseyPodiatry) Sen for nail problem. ABI ordered. Follow up in 3 months.   Hospital visits:  None in previous 6 months  Objective:  Lab Results  Component Value Date   CREATININE 0.83 01/20/2022   CREATININE 0.80 07/21/2021   CREATININE 0.73  12/25/2020    Lab Results  Component Value Date   HGBA1C 5.7 (A) 01/20/2022   Last diabetic Eye exam:  Lab Results  Component Value Date/Time   HMDIABEYEEXA No Retinopathy 11/02/2018 12:00 AM    Last diabetic Foot exam: No results found for: "HMDIABFOOTEX"      Component Value Date/Time   CHOL 120 07/21/2021 0000   TRIG 91 07/21/2021 0000   HDL 45 (L) 07/21/2021 0000   CHOLHDL 2.7 07/21/2021 0000   VLDL 20 10/13/2020 1759   LDLCALC 58 07/21/2021 0000       Latest Ref Rng & Units 01/20/2022   12:00 AM 12/25/2020   12:00 AM 10/15/2020    3:43 AM  Hepatic Function  Total Protein 6.1 - 8.1 g/dL 7.3  7.3  5.2   Albumin 3.5 - 5.0 g/dL   2.4   AST 10 - 35 U/L 38  18  24   ALT 6 - 29 U/L 35  16  22   Alk Phosphatase 38 - 126 U/L   52   Total Bilirubin 0.2 - 1.2 mg/dL 0.5  0.3  0.1     Lab Results  Component Value Date/Time   TSH 1.73 12/25/2020 12:00 AM   TSH 1.650 11/28/2019 08:54 AM       Latest Ref Rng & Units 12/25/2020   12:00 AM 11/18/2020   12:00 AM 10/15/2020    3:43 AM  CBC  WBC 3.8 - 10.8 Thousand/uL 12.9  10.2  8.2   Hemoglobin 11.7 - 15.5 g/dL 11.3  11.1  9.2   Hematocrit 35.0 - 45.0 % 39.7  38.5  29.7   Platelets 140 - 400 Thousand/uL 350  429  233     Lab Results  Component Value Date/Time   VD25OH 33.5 11/28/2019 08:54 AM    Social History   Tobacco Use  Smoking Status Former   Types: Cigarettes   Quit date: 07/30/2007   Years since quitting: 14.5  Smokeless Tobacco Never   BP Readings from Last 3 Encounters:  01/20/22 109/73  10/20/21 121/79  07/21/21 (!) 104/56   Pulse Readings from Last 3 Encounters:  01/20/22 90  10/20/21 96  07/21/21 84   Wt Readings from Last 3 Encounters:  01/20/22 190 lb (86.2 kg)  10/20/21 203 lb (92.1 kg)  07/21/21 213 lb (96.6 kg)    Assessment: Review of patient past medical history, allergies, medications, health status, including review of consultants reports, laboratory and other test data, was  performed as part of comprehensive evaluation and provision of chronic care management services.   SDOH:  (Social Determinants of Health) assessments and interventions performed:  SDOH Interventions    Flowsheet Row Office Visit from 01/26/2021 in [place] Most recent reading at 01/26/2021  4:13 PM Office Visit from 02/05/2019 in [place] Most recent reading at 02/05/2019 11:27 AM Clinical Support from 07/11/2018 in [place] Most recent reading at 07/11/2018  2:12 PM Office Visit from 07/11/2018 in [place] Most recent reading at 07/11/2018  9:10 AM  SDOH Interventions      Food Insecurity Interventions Intervention Not Indicated -- -- --  Housing Interventions Intervention Not Indicated -- -- --  Transportation Interventions  Intervention Not Indicated -- -- --  Depression Interventions/Treatment  -- Currently on Treatment Medication Currently on Treatment  Financial Strain Interventions Intervention Not Indicated -- -- --  Physical Activity Interventions Intervention Not Indicated -- -- --  Stress Interventions Intervention Not Indicated -- -- --  Social Connections Interventions Intervention Not Indicated -- -- --       CCM Care Plan  Allergies  Allergen Reactions   Penicillins Hives and Other (See Comments)    PATIENT HAS HAD A PCN REACTION WITH IMMEDIATE RASH, FACIAL/TONGUE/THROAT SWELLING, SOB, OR LIGHTHEADEDNESS WITH HYPOTENSION:  #  #  YES  #  #  Has patient had a PCN reaction causing severe rash involving mucus membranes or skin necrosis: No Has patient had a PCN reaction that required hospitalization: No Has patient had a PCN reaction occurring within the last 10 years: No If all of the above answers are "NO", then may proceed with Cephalosporin use.    Duloxetine Other (See Comments)    GI upset   Tramadol Other (See  Comments)    Nervousness, causes hands to shake   Aspirin Nausea Only   Codeine Itching   Hydrocodone Nausea Only and Other (See Comments)    Nose bleed   Metformin And Related Diarrhea and Nausea Only   Oxycodone Nausea And Vomiting    GI Upset (intolerance)   Oxycodone-Acetaminophen Nausea Only   Victoza [Liraglutide] Other (See Comments)    Abdominal pain    Medications Reviewed Today     Reviewed by Hali Marry, MD (Physician) on 01/20/22 at 1614  Med List Status: <None>   Medication Order Taking? Sig Documenting Provider Last Dose Status Informant  Accu-Chek Softclix Lancets lancets [id number]  DX:E11.9 USE AS DIRECTED Hali Marry, MD  Active   acetaminophen (TYLENOL) 650 MG CR tablet [id number]  Take 650 mg by mouth every 8 (eight) hours as needed for pain. [provider]  Active   aspirin EC 81 MG tablet [id number]  Take 1 tablet (81 mg total) by mouth daily. Costella, Vista Mink, PA-C  Active Multiple Informants           Med Note Maryruth Eve, Leanna Battles   Thu Dec 25, 2020 10:27 AM)    atorvastatin (LIPITOR) 40 MG tablet [id number]  Take 1 tablet (40 mg total) by mouth at bedtime. Hali Marry, MD  Active   Blood Glucose Monitoring Suppl (ACCU-CHEK AVIVA PLUS) w/Device KIT [id number]  Check blood sugars 3 times daily DX:E11.9 Hali Marry, MD  Active Multiple Informants  cholecalciferol (VITAMIN D) 1000 units tablet [id number]  Take 1,000 Units by mouth daily. [provider]  Active Multiple Informants  dexlansoprazole (DEXILANT) 60 MG capsule [id number]  Take 60 mg by mouth daily. [provider]  Active Multiple Informants  dicyclomine (BENTYL) 20 MG tablet [id number]  Take 20 mg by mouth every 6 (six) hours as needed for spasms (cramps/stomach pain). [provider]  Active Multiple Informants  doxepin (SINEQUAN) 10 MG capsule [id number]  Take 10 mg by mouth at bedtime. [provider]  Active Multiple  Informants  famotidine (PEPCID) 20 MG tablet [id number]  Take by mouth. [provider]  Active   Ferrous Sulfate ([employer] SLOW RELEASE IRON PO) [id number]  Take 1 tablet by mouth daily. [provider]  Active   fluticasone (FLONASE) 50 MCG/ACT nasal spray [id number]  SPRAY 1 SPRAY INTO BOTH NOSTRILS DAILY. Hali Marry, MD  Active   glipiZIDE (GLUCOTROL) 5 MG tablet  [id number]  Take 1 tablet (5 mg total) by mouth 2 (two) times daily before a meal. Hali Marry, MD  Active   glucose blood (ACCU-CHEK GUIDE) test strip [id number]  CHECK BLOOD SUGAR 3 TIMES DAILY. DX: E11.9 Hali Marry, MD  Active     Discontinued 01/20/22 1022 (Completed Course)   JARDIANCE 25 MG TABS tablet [id number]  TAKE 25 MG BY MOUTH DAILY. Hali Marry, MD  Active   Krill Oil 500 MG CAPS [id number]  Take 500 mg by mouth daily. [provider]  Active Multiple Informants           Med Note Ar[place]us Ped Jan 22, 2021  8:48 AM)    levocetirizine (XYZAL) 5 MG tablet [id number]  TAKE 1 TABLET BY MOUTH EVERY DAY IN THE Erskine Emery, MD  Active   Multiple Vitamin (MULTIVITAMIN WITH MINERALS) TABS tablet [id number]  Take 1 tablet by mouth daily. [provider]  Active Multiple Informants           Med Note Ar[place]us Ped Jan 22, 2021  8:48 AM)    OVER THE COUNTER MEDICATION [id number]  Take 1 tablet by mouth 3 (three) times daily. Golo otc weight loss supplement [provider]  Active Multiple Informants           Med Note Maryruth Eve, Leanna Battles   Thu Jan 22, 2021  8:48 AM)    pregabalin (LYRICA) 75 MG capsule [id number]  Take 1 capsule (75 mg total) by mouth 2 (two) times daily. Hali Marry, MD  Active   Semaglutide [facility], 0.25 OR 0.5 MG/DOSE, [place]) [id number] Yes Inject 0.25 mg into the skin every 7 (seven) days. [provider]  Active   sucralfate (CARAFATE) 1 g tablet [id number]  TAKE 1 TABLET (1 G TOTAL) BY  MOUTH 4 TIMES A DAY WITH MEALS AND AT BEDTIME Hali Marry, MD  Active             Patient Active Problem List   Diagnosis Date Noted   Left hip pain 02/23/2021   Low back pain 02/23/2021   Gait abnormality 02/23/2021   Bilateral lower extremity edema 01/22/2021   Elevated CK 10/12/2020   Spinal stenosis 10/09/2020   Lumbar adjacent segment disease with spondylolisthesis 10/09/2020   Spinal stenosis, lumbar region with neurogenic claudication 04/16/2020   Peripheral neuropathy 11/28/2019   Paresthesia and pain of both upper extremities 11/12/2019   Chronic midline low back pain with bilateral sciatica 10/30/2019   Left thigh pain 10/30/2019   Numbness 10/30/2019   Meralgia paresthetica, left 09/12/2019   Tennis Must Quervain's disease (tenosynovitis) 02/06/2019   Acute bilateral low back pain without sciatica 12/01/2018   Orthostatic hypotension 12/01/2018   Failed back syndrome of lumbar spine 11/25/2017   Inflammation of sacroiliac joint ([place]) 06/07/2017   Displacement of lumbar intervertebral disc 11/22/2016   Fatty liver 09/27/2016   Status post bilateral breast reduction 03/23/2016   Hypertrophy of breast 08/12/2015   Spondylolisthesis of lumbar region 06/13/2015   History of lumbar fusion 04/07/2015   Radiculopathy, lumbar region 04/07/2015   Essential hypertension 02/14/2015   Postprandial vomiting 02/04/2014   Obesity (BMI 30-39.9) 11/01/2013   Carotid artery stenosis 06/07/2013   Hereditary and idiopathic peripheral neuropathy 06/07/2013   History of migraine 05/04/2013   Type 2 diabetes mellitus with other specified complication ([place]) 07/62/2633   SVT (supraventricular tachycardia) 11/03/2010   GERD (gastroesophageal reflux disease)  10/29/2010   Chronic low back pain 10/29/2010   Hyperlipidemia associated with type 2 diabetes mellitus ([place]) 10/29/2010    Immunization History  Administered Date(s) Administered   Fluad Quad(high Dose 65+) 12/25/2020,  01/04/2022, 01/20/2022   Influenza Split 03/02/2011, 02/25/2012   Influenza, High Dose Seasonal PF 06/06/2017, 12/25/2017, 12/19/2018, 12/13/2019   Influenza,inj,Quad PF,6+ Mos 02/13/2015, 12/23/2015, 12/22/2016   Influenza-Unspecified 01/24/2013, 01/21/2014, 02/26/2015   Moderna Sars-Covid-2 Vaccination 06/08/2019, 07/06/2019   PFIZER Comirnaty(Gray Top)Covid-19 Tri-Sucrose Vaccine 01/22/2022   PFIZER(Purple Top)SARS-COV-2 Vaccination 03/25/2020   PNEUMOCOCCAL CONJUGATE-20 02/05/2021   PPD Test 02/16/2017   Pfizer Covid-19 Vaccine Bivalent Booster 5yr & up 07/05/2021   Pneumococcal Conjugate-13 02/14/2015   Pneumococcal Polysaccharide-23 05/12/2005, 01/17/2018, 07/15/2020   Td 07/07/2019   Tdap 04/27/2007, 07/30/2017, 11/16/2019   Zoster Recombinat (Shingrix) 04/01/2017, 08/10/2017    Conditions to be addressed/monitored: HTN, HLD, and DMII  There are no care plans that you recently modified to display for this patient.        Medication Assistance: Application for novonordisk  medication assistance program. in process.  Anticipated assistance start date TBD.  See plan of care for additional detail.  Patient[facility]  [id number] [address] [place] [id number]: [phone]: [phone] pill box? Yes Pt endorses 80% compliance  Follow Up:  Patient agrees to Care Plan and Follow-up.  Plan: Telephone follow up appointment with care management team member scheduled for:  3-4 weeks  KeLarinda ButteryPharmD Clinical Pharmacist CoSentara [facility]rim[facility] MeTallah[phone]

## 2022-01-29 NOTE — Progress Notes (Signed)
Chronic Care Management Pharmacy Note  01/29/2022 Name:  Amanda Morris MRN:  [id number] DOB:  16-Jun-1950  Summary: addressed DM, HTN, HLD. She is receiving ozempic via patient assistance, began 0.67m weekly on 12/30/21. She has come entirely off insulin, and reducing her glipizide dosing.  She is taking 1/2 pill of glipizide 152mBID to use up her current pills. Leaving ozempic at 0.2548meekly per PCP. No other changes to her medication.  BG readings on glipizide 5mg88mD, ozempic 0.25mg38mkly, jardiance 25mg 43my: Lowest 86, 74, and highest is 167  Recommendations/Changes made from today's visit:  - No changes, continue current medication - Goal: increase ozempic to 0.5mg we40my (only if needed), stop glipizide entirely.   Plan: f/u with patient in 3-4 weeks  Subjective: Amanda Maegen Wigle71 y.o.1ear old female who is a primary patient of Metheney, CatheriRene KocherThe CCM team was consulted for assistance with disease management and care coordination needs.    Engaged with patient by telephone for follow up visit in response to provider referral for pharmacy case management and/or care coordination services.   Consent to Services:  The patient was given the following information about Chronic Care Management services today, agreed to services, and gave verbal consent: 1. CCM service includes personalized support from designated clinical staff supervised by the primary care provider, including individualized plan of care and coordination with other care providers 2. 24/7 contact phone numbers for assistance for urgent and routine care needs. 3. Service will only be billed when office clinical staff spend 20 minutes or more in a month to coordinate care. 4. Only one practitioner may furnish and bill the service in a calendar month. 5.The patient may stop CCM services at any time (effective at the end of the month) by phone call to the office staff. 6. The patient will  be responsible for cost sharing (co-pay) of up to 20% of the service fee (after annual deductible is met). Patient agreed to services and consent obtained.  Patient Care Team: MetheneHali Marry PCP - General (Family Medicine) Weeks, Eulis Cannerastroenterology) ThekkekSilverio Decamp Consulting Physician (Family Medicine) Edris Friedt, Darius BumpPhNemaha Valley Community Hospitalacist)  Recent office visits:  06/08/21-Catherine D. MetheneMadilyn FiremanCP) Seen for new referral for 2nd opinion for back surgery. Increase insulin to 28 units nightly for 1 week.  [facility]. 04/23/21-[name], MD (PCP) Seen for a diabetic follow up visit and Gastroesophageal Reflux. Ambulatory referral to Gastroenterology. Increase her Jardiance back to a whole tab daily and then decrease her insulin by 2 units. Labs ordered. Follow up in 3 months.  01/26/21-Catherine D. MetheneMadilyn FiremanCP) Medicare annual wellness visit. Follow up in 1 year.  01/22/21-Catherine D. MetheneMadilyn FiremanCP) Seen for follow up visit. Decrease glipizide to 5 mg twice daily. Labs ordered. Follow up in 3 months. 12/25/20-Catherine D. MetheneMadilyn FiremanCP) Seen for foot pain. Labs ordered. Flu vaccine given. Start over the counter Iron supplement. Start on Hydrochlorothiazide 25 mg. Follow up in 4 weeks.    Recent consult visits:  05/25/21-Karl A. Pleasant, PA (Gastroenterology) Seen for abdominal pain.  03/10/21-Kyle L. CabbellChristella Noaeurosurgery) Notes not available. 02/23/21-Yijun Yan, MDKrista Blueeurology) Follow up visit.  01/26/21-Karl A. Pleasant, PA (Gastroenterology) Labs ordered. Follow up in 3 months. 12/30/20-Matthew R. WagonerJacqualyn PoseyPodiatry) Sen for nail problem. ABI ordered. Follow up in 3 months.   Hospital visits:  None in previous 6 months  Objective:  Lab Results  Component Value Date   CREATININE 0.83 01/20/2022   CREATININE 0.80 07/21/2021   CREATININE 0.73  12/25/2020    Lab Results  Component Value Date   HGBA1C 5.7 (A) 01/20/2022   Last diabetic Eye exam:  Lab Results  Component Value Date/Time   HMDIABEYEEXA No Retinopathy 11/02/2018 12:00 AM    Last diabetic Foot exam: No results found for: "HMDIABFOOTEX"      Component Value Date/Time   CHOL 120 07/21/2021 0000   TRIG 91 07/21/2021 0000   HDL 45 (L) 07/21/2021 0000   CHOLHDL 2.7 07/21/2021 0000   VLDL 20 10/13/2020 1759   LDLCALC 58 07/21/2021 0000       Latest Ref Rng & Units 01/20/2022   12:00 AM 12/25/2020   12:00 AM 10/15/2020    3:43 AM  Hepatic Function  Total Protein 6.1 - 8.1 g/dL 7.3  7.3  5.2   Albumin 3.5 - 5.0 g/dL   2.4   AST 10 - 35 U/L 38  18  24   ALT 6 - 29 U/L 35  16  22   Alk Phosphatase 38 - 126 U/L   52   Total Bilirubin 0.2 - 1.2 mg/dL 0.5  0.3  0.1     Lab Results  Component Value Date/Time   TSH 1.73 12/25/2020 12:00 AM   TSH 1.650 11/28/2019 08:54 AM       Latest Ref Rng & Units 12/25/2020   12:00 AM 11/18/2020   12:00 AM 10/15/2020    3:43 AM  CBC  WBC 3.8 - 10.8 Thousand/uL 12.9  10.2  8.2   Hemoglobin 11.7 - 15.5 g/dL 11.3  11.1  9.2   Hematocrit 35.0 - 45.0 % 39.7  38.5  29.7   Platelets 140 - 400 Thousand/uL 350  429  233     Lab Results  Component Value Date/Time   VD25OH 33.5 11/28/2019 08:54 AM    Social History   Tobacco Use  Smoking Status Former   Types: Cigarettes   Quit date: 07/30/2007   Years since quitting: 14.5  Smokeless Tobacco Never   BP Readings from Last 3 Encounters:  01/20/22 109/73  10/20/21 121/79  07/21/21 (!) 104/56   Pulse Readings from Last 3 Encounters:  01/20/22 90  10/20/21 96  07/21/21 84   Wt Readings from Last 3 Encounters:  01/20/22 190 lb (86.2 kg)  10/20/21 203 lb (92.1 kg)  07/21/21 213 lb (96.6 kg)    Assessment: Review of patient past medical history, allergies, medications, health status, including review of consultants reports, laboratory and other test data, was  performed as part of comprehensive evaluation and provision of chronic care management services.   SDOH:  (Social Determinants of Health) assessments and interventions performed:  SDOH Interventions    Flowsheet Row Office Visit from 01/26/2021 in [facility] Most recent reading at 01/26/2021  4:13 PM Office Visit from 02/05/2019 in [facility] Most recent reading at 02/05/2019 11:27 AM Clinical Support from 07/11/2018 in [facility] Most recent reading at 07/11/2018  2:12 PM Office Visit from 07/11/2018 in [facility] Most recent reading at 07/11/2018  9:10 AM  SDOH Interventions      Food Insecurity Interventions Intervention Not Indicated -- -- --  Housing Interventions Intervention Not Indicated -- -- --  Transportation Interventions  Intervention Not Indicated -- -- --  Depression Interventions/Treatment  -- Currently on Treatment Medication Currently on Treatment  Financial Strain Interventions Intervention Not Indicated -- -- --  Physical Activity Interventions Intervention Not Indicated -- -- --  Stress Interventions Intervention Not Indicated -- -- --  Social Connections Interventions Intervention Not Indicated -- -- --       CCM Care Plan  Allergies  Allergen Reactions   Penicillins Hives and Other (See Comments)    PATIENT HAS HAD A PCN REACTION WITH IMMEDIATE RASH, FACIAL/TONGUE/THROAT SWELLING, SOB, OR LIGHTHEADEDNESS WITH HYPOTENSION:  #  #  YES  #  #  Has patient had a PCN reaction causing severe rash involving mucus membranes or skin necrosis: No Has patient had a PCN reaction that required hospitalization: No Has patient had a PCN reaction occurring within the last 10 years: No If all of the above answers are "NO", then may proceed with Cephalosporin use.    Duloxetine Other (See Comments)    GI upset   Tramadol Other (See  Comments)    Nervousness, causes hands to shake   Aspirin Nausea Only   Codeine Itching   Hydrocodone Nausea Only and Other (See Comments)    Nose bleed   Metformin And Related Diarrhea and Nausea Only   Oxycodone Nausea And Vomiting    GI Upset (intolerance)   Oxycodone-Acetaminophen Nausea Only   Victoza [Liraglutide] Other (See Comments)    Abdominal pain    Medications Reviewed Today     Reviewed by Darius Bump, [facility] (Pharmacist) on 01/29/22 at [place] List Status: <None>   Medication Order Taking? Sig Documenting Provider Last Dose Status Informant  Accu-Chek Softclix Lancets lancets [id number] No DX:E11.9 USE AS DIRECTED Hali Marry, MD Taking Active   acetaminophen (TYLENOL) 650 MG CR tablet [id number] No Take 650 mg by mouth every 8 (eight) hours as needed for pain. [provider] Taking Active   aspirin EC 81 MG tablet [id number] No Take 1 tablet (81 mg total) by mouth daily. Traci Sermon, PA-C Taking Active Multiple Informants           Med Note Maryruth Eve, Leanna Battles   Thu Dec 25, 2020 10:27 AM)    atorvastatin (LIPITOR) 40 MG tablet [id number] No Take 1 tablet (40 mg total) by mouth at bedtime. Hali Marry, MD Taking Active   Blood Glucose Monitoring Suppl (ACCU-CHEK AVIVA PLUS) w/Device KIT [id number] No Check blood sugars 3 times daily DX:E11.9 Hali Marry, MD Taking Active Multiple Informants  cholecalciferol (VITAMIN D) 1000 units tablet [id number] No Take 1,000 Units by mouth daily. [provider] Taking Active Multiple Informants  dexlansoprazole (DEXILANT) 60 MG capsule [id number] No Take 60 mg by mouth daily. [provider] Taking Active Multiple Informants  dicyclomine (BENTYL) 20 MG tablet [id number] No Take 20 mg by mouth every 6 (six) hours as needed for spasms (cramps/stomach pain). [provider] Taking Active Multiple Informants  doxepin (SINEQUAN) 10 MG capsule [id number] No Take 10 mg by mouth  at bedtime. [provider] Taking Active Multiple Informants  famotidine (PEPCID) 20 MG tablet [id number] No Take by mouth. [provider] Taking Active   Ferrous Sulfate ([facility] SLOW RELEASE IRON PO) [id number] No Take 1 tablet by mouth daily. [provider] Taking Active   fluticasone (FLONASE) 50 MCG/ACT nasal spray [id number] No SPRAY 1 SPRAY INTO BOTH NOSTRILS DAILY. Hali Marry, MD Taking Active   glipiZIDE (GLUCOTROL) 5 MG tablet  [id number]  Take 1 tablet (5 mg total) by mouth 2 (two) times daily before a meal. Hali Marry, MD  Active   glucose blood (ACCU-CHEK GUIDE) test strip [id number] No CHECK BLOOD SUGAR 3 TIMES DAILY. DX: E11.9 Hali Marry, MD Taking Active   hydrochlorothiazide (HYDRODIURIL) 25 MG tablet [id number]  TAKE 1 TABLET (25 MG TOTAL) BY MOUTH DAILY AS NEEDED. FOR SWELLING Hali Marry, MD  Active   JARDIANCE 25 MG TABS tablet [id number] No TAKE 25 MG BY MOUTH DAILY. Hali Marry, MD Taking Active   Krill Oil 500 MG CAPS [id number] No Take 500 mg by mouth daily. [provider] Taking Active Multiple Informants           Med Note Arbutus Ped Jan 22, 2021  8:48 AM)    levocetirizine (XYZAL) 5 MG tablet [id number] No TAKE 1 TABLET BY MOUTH EVERY DAY IN THE Erskine Emery, MD Taking Active   Multiple Vitamin (MULTIVITAMIN WITH MINERALS) TABS tablet [id number] No Take 1 tablet by mouth daily. [provider] Taking Active Multiple Informants           Med Note Maryruth Eve, Leanna Battles   Thu Jan 22, 2021  8:48 AM)    ondansetron (ZOFRAN) 4 MG tablet [id number]  Take 1 tablet (4 mg total) by mouth every 8 (eight) hours as needed for nausea or vomiting. Hali Marry, MD  Active   OVER THE COUNTER MEDICATION [id number] No Take 1 tablet by mouth 3 (three) times daily. Golo otc weight loss supplement [provider] Taking Active Multiple Informants           Med Note  Maryruth Eve, Leanna Battles   Thu Jan 22, 2021  8:48 AM)    pregabalin (LYRICA) 75 MG capsule [id number]  Take 1 capsule (75 mg total) by mouth 2 (two) times daily. Hali Marry, MD  Active   Semaglutide (OZEMPIC, 0.25 OR 0.5 MG/DOSE, [place]) [id number]  Inject 0.25 mg into the skin every 7 (seven) days. [provider]  Active   sucralfate (CARAFATE) 1 g tablet [id number] No TAKE 1 TABLET (1 G TOTAL) BY MOUTH 4 TIMES A DAY WITH MEALS AND AT BEDTIME Hali Marry, MD Taking Active             Patient Active Problem List   Diagnosis Date Noted   Left hip pain 02/23/2021   Low back pain 02/23/2021   Gait abnormality 02/23/2021   Bilateral lower extremity edema 01/22/2021   Elevated CK 10/12/2020   Spinal stenosis 10/09/2020   Lumbar adjacent segment disease with spondylolisthesis 10/09/2020   Spinal stenosis, lumbar region with neurogenic claudication 04/16/2020   Peripheral neuropathy 11/28/2019   Paresthesia and pain of both upper extremities 11/12/2019   Chronic midline low back pain with bilateral sciatica 10/30/2019   Left thigh pain 10/30/2019   Numbness 10/30/2019   Meralgia paresthetica, left 09/12/2019   Tennis Must Quervain's disease (tenosynovitis) 02/06/2019   Acute bilateral low back pain without sciatica 12/01/2018   Orthostatic hypotension 12/01/2018   Failed back syndrome of lumbar spine 11/25/2017   Inflammation of sacroiliac joint ([place]) 06/07/2017   Displacement of lumbar intervertebral disc 11/22/2016   Fatty liver 09/27/2016   Status post bilateral breast reduction 03/23/2016   Hypertrophy of breast 08/12/2015   Spondylolisthesis of lumbar region 06/13/2015   History of lumbar fusion 04/07/2015   Radiculopathy, lumbar region 04/07/2015   Essential hypertension 02/14/2015   Postprandial  vomiting 02/04/2014   Obesity (BMI 30-39.9) 11/01/2013   Carotid artery stenosis 06/07/2013   Hereditary and idiopathic peripheral neuropathy 06/07/2013   History of  migraine 05/04/2013   Type 2 diabetes mellitus with other specified complication ([place]) 15/95/3967   SVT (supraventricular tachycardia) 11/03/2010   GERD (gastroesophageal reflux disease) 10/29/2010   Chronic low back pain 10/29/2010   Hyperlipidemia associated with type 2 diabetes mellitus ([place]) 10/29/2010    Immunization History  Administered Date(s) Administered   Fluad Quad(high Dose 65+) 12/25/2020, 01/04/2022, 01/20/2022   Influenza Split 03/02/2011, 02/25/2012   Influenza, High Dose Seasonal PF 06/06/2017, 12/25/2017, 12/19/2018, 12/13/2019   Influenza,inj,Quad PF,6+ Mos 02/13/2015, 12/23/2015, 12/22/2016   Influenza-Unspecified 01/24/2013, 01/21/2014, 02/26/2015   Moderna Sars-Covid-2 Vaccination 06/08/2019, 07/06/2019   PFIZER Comirnaty(Gray Top)Covid-19 Tri-Sucrose Vaccine 01/22/2022   PFIZER(Purple Top)SARS-COV-2 Vaccination 03/25/2020   PNEUMOCOCCAL CONJUGATE-20 02/05/2021   PPD Test 02/16/2017   Pfizer Covid-19 Vaccine Bivalent Booster 24yr & up 07/05/2021   Pneumococcal Conjugate-13 02/14/2015   Pneumococcal Polysaccharide-23 05/12/2005, 01/17/2018, 07/15/2020   Td 07/07/2019   Tdap 04/27/2007, 07/30/2017, 11/16/2019   Zoster Recombinat (Shingrix) 04/01/2017, 08/10/2017    Conditions to be addressed/monitored: HTN, HLD, and DMII  There are no care plans that you recently modified to display for this patient.        Medication Assistance: Application for novonordisk  medication assistance program. in process.  Anticipated assistance start date TBD.  See plan of care for additional detail.  Patient's [facility] pharmacy [facility]  [id number] [id number]: [phone]: [phone] pill box? Yes Pt endorses 80% compliance  Follow Up:  Patient agrees to Care Plan and Follow-up.  Plan: Telephone follow up appointment with care management  team member scheduled for:  3-4 weeks  KeLarinda ButteryPharmD Clinical Pharmacist CoKindred [facility]im[facility] MePhys[phone]

## 2022-01-29 NOTE — Patient Instructions (Signed)
Visit Information  Thank you for taking time to visit with me today. Please don't hesitate to contact me if I can be of assistance to you before our next scheduled telephone appointment.  Following are the goals we discussed today:    Patient Goals/Self-Care Activities Over the next 30 days, patient will:  take medications as prescribed, check glucose daily, document, and provide at future appointments, and collaborate with provider on medication access solutions  Follow Up Plan: Telephone follow up appointment with care management team member scheduled for: 3-4 weeks   Please call the care guide team at [phone] if you need to cancel or reschedule your appointment.    The patient verbalized understanding of instructions, educational materials, and care plan provided today and agreed to receive a mailed copy of patient instructions, educational materials, and care plan.   Darius Bump

## 2022-02-01 ENCOUNTER — Telehealth: Payer: Medicare Other

## 2022-02-03 ENCOUNTER — Ambulatory Visit (INDEPENDENT_AMBULATORY_CARE_PROVIDER_SITE_OTHER): Payer: Medicare Other | Admitting: Family Medicine

## 2022-02-03 DIAGNOSIS — Z0000 Encounter for general adult medical examination without abnormal findings: Secondary | ICD-10-CM

## 2022-02-03 DIAGNOSIS — Z1231 Encounter for screening mammogram for malignant neoplasm of breast: Secondary | ICD-10-CM

## 2022-02-03 NOTE — Progress Notes (Signed)
**Note Amanda-Identified via Obfuscation** MEDICARE ANNUAL WELLNESS VISIT  02/03/2022  Telephone Visit Disclaimer This Medicare AWV was conducted by telephone due to national recommendations for restrictions regarding the COVID-19 Pandemic (e.g. social distancing).  I verified, using two identifiers, that I am speaking with Amanda Morris or their authorized healthcare agent. I discussed the limitations, risks, security, and privacy concerns of performing an evaluation and management service by telephone and the potential availability of an in-person appointment in the future. The patient expressed understanding and agreed to proceed.  Location of Patient: Home Location of Provider (nurse):  in the office.  Subjective:    Amanda Morris is a 71 y.o. female patient of Metheney, Rene Kocher, MD who had a Medicare Annual Wellness Visit today via telephone. Amanda Morris is Retired and lives alone. she had 1 child and her child has deceased. she reports that she is socially active and does interact with friends/family regularly. she is moderately physically active and enjoys reading the bible and spending time with her nieces and nephews.  Patient Care Team: Hali Marry, MD as PCP - General (Family Medicine) Eulis Canner, MD (Gastroenterology) Silverio Decamp, MD as Consulting Physician (Family Medicine) Darius Bump, [facility] (Pharmacist)     02/03/2022   10:01 AM 01/26/2021    4:11 PM 10/12/2020    4:48 PM 10/10/2020    8:00 AM 10/07/2020    8:20 AM 08/29/2020    8:56 AM 05/22/2020   11:12 AM  Advanced Directives  Does Patient Have a Medical Advance Directive? Yes No No No No Yes No  Type of Advance Directive Living will        Does patient want to make changes to medical advance directive? No - Patient declined        Would patient like information on creating a medical advance directive?  No - Patient declined No - Patient declined No - Patient declined Yes (MAU/Ambulatory/Procedural Areas -  Information given)  No - Patient declined    Hospital Utilization Over the Past 12 Months: # of hospitalizations or ER visits: 0 # of surgeries: 0  Review of Systems    Patient reports that her overall health is unchanged compared to last year.  History obtained from chart review and the patient  Patient Reported Readings (BP, Pulse, CBG, Weight, etc) none  Pain Assessment Pain : No/denies pain     Current Medications & Allergies (verified) Allergies as of 02/03/2022       Reactions   Penicillins Hives, Other (See Comments)   PATIENT HAS HAD A PCN REACTION WITH IMMEDIATE RASH, FACIAL/TONGUE/THROAT SWELLING, SOB, OR LIGHTHEADEDNESS WITH HYPOTENSION:  #  #  YES  #  #  Has patient had a PCN reaction causing severe rash involving mucus membranes or skin necrosis: No Has patient had a PCN reaction that required hospitalization: No Has patient had a PCN reaction occurring within the last 10 years: No If all of the above answers are "NO", then may proceed with Cephalosporin use.   Duloxetine Other (See Comments)   GI upset   Tramadol Other (See Comments)   Nervousness, causes hands to shake   Aspirin Nausea Only   Codeine Itching   Hydrocodone Nausea Only, Other (See Comments)   Nose bleed   Metformin And Related Diarrhea, Nausea Only   Oxycodone Nausea And Vomiting   GI Upset (intolerance)   Oxycodone-acetaminophen Nausea Only   Victoza [liraglutide] Other (See Comments)   Abdominal pain  Medication List        Accurate as of February 03, 2022 10:17 AM. If you have any questions, ask your nurse or doctor.          Accu-Chek Aviva Plus w/Device Kit Check blood sugars 3 times daily DX:E11.9   Accu-Chek Guide test strip Generic drug: glucose blood CHECK BLOOD SUGAR 3 TIMES DAILY. DX: E11.9   Accu-Chek Softclix Lancets lancets DX:E11.9 USE AS DIRECTED   acetaminophen 650 MG CR tablet Commonly known as: TYLENOL Take 650 mg by mouth every 8 (eight) hours  as needed for pain.   aspirin EC 81 MG tablet Take 1 tablet (81 mg total) by mouth daily.   atorvastatin 40 MG tablet Commonly known as: LIPITOR Take 1 tablet (40 mg total) by mouth at bedtime.   cholecalciferol 1000 units tablet Commonly known as: VITAMIN D Take 1,000 Units by mouth daily.   [employer] SLOW RELEASE IRON PO Take 1 tablet by mouth daily.   dexlansoprazole 60 MG capsule Commonly known as: DEXILANT Take 60 mg by mouth daily.   dicyclomine 20 MG tablet Commonly known as: BENTYL Take 20 mg by mouth every 6 (six) hours as needed for spasms (cramps/stomach pain).   doxepin 10 MG capsule Commonly known as: SINEQUAN Take 10 mg by mouth at bedtime.   famotidine 20 MG tablet Commonly known as: PEPCID Take by mouth.   fluticasone 50 MCG/ACT nasal spray Commonly known as: FLONASE SPRAY 1 SPRAY INTO BOTH NOSTRILS DAILY.   glipiZIDE 5 MG tablet Commonly known as: GLUCOTROL Take 1 tablet (5 mg total) by mouth 2 (two) times daily before a meal.   hydrochlorothiazide 25 MG tablet Commonly known as: HYDRODIURIL TAKE 1 TABLET (25 MG TOTAL) BY MOUTH DAILY AS NEEDED. FOR SWELLING   Jardiance 25 MG Tabs tablet Generic drug: empagliflozin TAKE 25 MG BY MOUTH DAILY.   Krill Oil 500 MG Caps Take 500 mg by mouth daily.   levocetirizine 5 MG tablet Commonly known as: XYZAL TAKE 1 TABLET BY MOUTH EVERY DAY IN THE EVENING   multivitamin with minerals Tabs tablet Take 1 tablet by mouth daily.   ondansetron 4 MG tablet Commonly known as: Zofran Take 1 tablet (4 mg total) by mouth every 8 (eight) hours as needed for nausea or vomiting.   OVER THE COUNTER MEDICATION Take 1 tablet by mouth 3 (three) times daily. Golo otc weight loss supplement   OZEMPIC (0.25 OR 0.5 MG/DOSE) [place] Inject 0.25 mg into the skin every 7 (seven) days.   pregabalin 75 MG capsule Commonly known as: LYRICA Take 1 capsule (75 mg total) by mouth 2 (two) times daily.   sucralfate 1 g  tablet Commonly known as: CARAFATE TAKE 1 TABLET (1 G TOTAL) BY MOUTH 4 TIMES A DAY WITH MEALS AND AT BEDTIME        History (reviewed): Past Medical History:  Diagnosis Date   Allergy    Diabetes mellitus without complication (HCC)    type 2   GERD (gastroesophageal reflux disease)    Headache    History of hiatal hernia    Hypercholesterolemia    Numbness    left, outer thigh   Palpitations    in the past, no current issues per patient   Postlaminectomy syndrome    Past Surgical History:  Procedure Laterality Date   ABDOMINAL HYSTERECTOMY  1982   BACK SURGERY     x 2    BREAST REDUCTION SURGERY Bilateral 03/17/2016   Procedure: MAMMARY  REDUCTION  (BREAST);  Surgeon: Wallace Going, DO;  Location: [place];  Service: Plastics;  Laterality: Bilateral;   BREAST SURGERY     COLONOSCOPY     LUMBAR WOUND DEBRIDEMENT N/A 05/22/2020   Procedure: Incision and drainage of lumbar wound;  Surgeon: Ashok Pall, MD;  Location: [place];  Service: Neurosurgery;  Laterality: N/A;   SHOULDER SURGERY  06/2008, 09/2010   Right, by Dr. Karie Soda, then Dr. Judeth Horn   SPINAL CORD STIMULATOR INSERTION N/A 12/02/2017   Procedure: LUMBAR SPINAL CORD STIMULATOR INSERTION;  Surgeon: Ashok Pall, MD;  Location: [place];  Service: Neurosurgery;  Laterality: N/A;   SPINAL CORD STIMULATOR TRIAL N/A 11/25/2017   Procedure: INSERTION LUMBAR SPINAL CORD STIMULATOR TRIAL  ;  Surgeon: Ashok Pall, MD;  Location: [place];  Service: Neurosurgery;  Laterality: N/A;   INSERTION LUMBAR SPINAL CORD STIMULATOR TRIAL     UPPER GI ENDOSCOPY     Family History  Problem Relation Age of Onset   Heart disease Mother    Diabetes Mother    Hypertension Mother    Stroke Mother    Heart disease Father    Heart disease Sister    Diabetes Sister    Hyperlipidemia Sister    Hypertension Sister    Stroke Sister    Alcohol abuse Brother    Hyperlipidemia Brother    Social History    Socioeconomic History   Marital status: Divorced    Spouse name: Not on file   Number of children: 1   Years of education: 9   Highest education level: 9th grade  Occupational History   Occupation: Disabled.     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 She had one child, who passed away  She enjoys spending time with her nieces and nephews and she also enjoys reading there bible.   Social Determinants of Health   Financial Resource Strain: Low Risk  (02/03/2022)   Overall Financial Resource Strain (CARDIA)    Difficulty of Paying Living Expenses: Not hard at all  Food Insecurity: No Food Insecurity (02/03/2022)   Hunger Vital Sign    Worried About Running Out of Food in the Last Year: Never true    Ran Out of Food in the Last Year: Never true  Transportation Needs: No Transportation Needs (02/03/2022)   PRAPARE - Hydrologist (Medical): No    Lack of Transportation (Non-Medical): No  Physical Activity: Insufficiently Active (02/03/2022)   Exercise Vital Sign    Days of Exercise per Week: 7 days    Minutes of Exercise per Session: 10 min  Stress: No Stress Concern Present (02/03/2022)   [place]    Feeling of Stress : Not at all  Social Connections: Moderately Isolated (02/03/2022)   Social Connection and Isolation Panel [NHANES]    Frequency of Communication with Friends and Family: More than three times a week    Frequency  of Social Gatherings with Friends and  Family: More than three times a week    Attends Religious Services: More than 4 times per year    Active Member of [employer] or Organizations: No    Attends Archivist Meetings: Never    Marital Status: Divorced    Activities of Daily Living    02/03/2022   10:06 AM  In your present state of health, do you have any difficulty performing the following activities:  Hearing? 0  Vision? 0  Difficulty concentrating or making decisions? 0  Walking or climbing stairs? 1  Comment can't climb stairs.  Dressing or bathing? 0  Doing errands, shopping? 0  Preparing Food and eating ? N  Using the Toilet? N  In the past six months, have you accidently leaked urine? N  Do you have problems with loss of bowel control? N  Managing your Medications? N  Managing your Finances? N  Housekeeping or managing your Housekeeping? Y  Comment due to back pain.    Patient Education/ Literacy How often do you need to have someone help you when you read instructions, pamphlets, or other written materials from your doctor or pharmacy?: 3 - Sometimes What is the last grade level you completed in school?: 9th grade  Exercise Current Exercise Habits: Home exercise routine, Type of exercise: Other - see comments (physical therapy exercises), Time (Minutes): 10, Frequency (Times/Week): 7, Weekly Exercise (Minutes/Week): 70, Intensity: Moderate, Exercise limited by: None identified  Diet Patient reports consuming 3 meals a day and 1 snack(s) a day Patient reports that her primary diet is: Regular Patient reports that she does have regular access to food.   Depression Screen    02/03/2022   10:02 AM 10/20/2021    8:44 AM 07/21/2021    8:33 AM 04/23/2021    8:28 AM 01/26/2021    4:09 PM 01/22/2021    9:18 AM 11/18/2020    9:54 AM  PHQ 2/9 Scores  PHQ - 2 Score 0 0 0 0 0 0 2  PHQ- 9 Score       2     Fall Risk    02/03/2022   10:01 AM 10/20/2021    8:44 AM 07/21/2021    8:33 AM 04/23/2021    8:27  AM 01/26/2021    4:09 PM  Fall Risk   Falls in the past year? 1 0 1 1 1   Number falls in past yr: 1 0 1 1 0  Injury with Fall? 0 0 0 0 1  Risk for fall due to : History of fall(s) Impaired balance/gait;Impaired mobility Orthopedic patient Impaired balance/gait Orthopedic patient  Follow up Falls evaluation completed;Education provided;Falls prevention discussed Falls prevention discussed;Falls evaluation completed Falls prevention discussed Falls prevention discussed;Falls evaluation completed Falls evaluation completed;Education provided;Falls prevention discussed     Objective:  Amanda Morris seemed alert and oriented and she participated appropriately during our telephone visit.  Blood Pressure Weight BMI  BP Readings from Last 3 Encounters:  01/20/22 109/73  10/20/21 121/79  07/21/21 (!) 104/56   Wt Readings from Last 3 Encounters:  01/20/22 190 lb (86.2 kg)  10/20/21 203 lb (92.1 kg)  07/21/21 213 lb (96.6 kg)   BMI Readings from Last 1 Encounters:  01/20/22 32.61 kg/m    *Unable to obtain current vital signs, weight, and BMI due to telephone visit type  Hearing/Vision  Iantha did not seem to have difficulty with hearing/understanding during the telephone conversation Reports that she has  had a formal eye exam by an eye care professional within the past year Reports that she has not had a formal hearing evaluation within the past year *Unable to fully assess hearing and vision during telephone visit type  Cognitive Function:    02/03/2022   10:10 AM 01/26/2021    4:16 PM 07/16/2019    8:09 AM 07/11/2018    2:18 PM 01/18/2017    9:06 AM  6CIT Screen  What Year? 0 points 0 points 0 points 0 points 0 points  What month? 0 points 0 points 0 points 0 points 0 points  What time? 0 points 0 points 0 points 0 points 0 points  Count back from 20 0 points 0 points 0 points 0 points 2 points  Months in reverse 0 points 0 points 2 points 0 points 0 points  Repeat phrase 0  points 4 points 0 points 10 points 6 points  Total Score 0 points 4 points 2 points 10 points 8 points   (Normal:0-7, Significant for Dysfunction: >8)  Normal Cognitive Function Screening: Yes   Immunization & Health Maintenance Record Immunization History  Administered Date(s) Administered   Fluad Quad(high Dose 65+) 12/25/2020, 01/04/2022, 01/20/2022   Influenza Split 03/02/2011, 02/25/2012   Influenza, High Dose Seasonal PF 06/06/2017, 12/25/2017, 12/19/2018, 12/13/2019   Influenza,inj,Quad PF,6+ Mos 02/13/2015, 12/23/2015, 12/22/2016   Influenza-Unspecified 01/24/2013, 01/21/2014, 02/26/2015   Moderna Sars-Covid-2 Vaccination 06/08/2019, 07/06/2019   PFIZER Comirnaty(Gray Top)Covid-19 Tri-Sucrose Vaccine 01/22/2022   PFIZER(Purple Top)SARS-COV-2 Vaccination 03/25/2020   PNEUMOCOCCAL CONJUGATE-20 02/05/2021   PPD Test 02/16/2017   Pfizer Covid-19 Vaccine Bivalent Booster 55yr & up 07/05/2021   Pneumococcal Conjugate-13 02/14/2015   Pneumococcal Polysaccharide-23 05/12/2005, 01/17/2018, 07/15/2020   Td 07/07/2019   Tdap 04/27/2007, 07/30/2017, 11/16/2019   Zoster Recombinat (Shingrix) 04/01/2017, 08/10/2017    Health Maintenance  Topic Date Due   OPHTHALMOLOGY EXAM  02/03/2022 (Originally 11/02/2019)   FOOT EXAM  02/04/2022 (Originally 01/22/2022)   COVID-19 Vaccine (6 - Mixed Product risk series) 03/19/2022   HEMOGLOBIN A1C  07/21/2022   Diabetic kidney evaluation - Urine ACR  10/21/2022   Diabetic kidney evaluation - GFR measurement  01/21/2023   MAMMOGRAM  02/22/2023   COLONOSCOPY (Pts 45-438yrInsurance coverage will need to be confirmed)  07/30/2026   TETANUS/TDAP  11/15/2029   Pneumonia Vaccine 65107Years old  Completed   INFLUENZA VACCINE  Completed   DEXA SCAN  Completed   Hepatitis C Screening  Completed   Zoster Vaccines- Shingrix  Completed   HPV VACCINES  Aged Out       Assessment  This is a routine wellness examination for [facility]  Maintenance: Due or Overdue There are no preventive care reminders to display for this patient.   [facility] not need a referral for Community Assistance: Care Management:   no Social Work:    no Prescription Assistance:  no Nutrition/Diabetes Education:  no   Plan:  Personalized Goals  Goals Addressed               This Visit's Progress     Patient Stated (pt-stated)        Patient would like to loose 36 lbs.       Personalized Health Maintenance & Screening Recommendations  Foot exam Eye exam- had it completed. Need records.  Lung Cancer Screening Recommended: no (Low Dose CT Chest recommended if Age 69-80ears, 30 pack-year currently smoking OR have quit w/in past 15 years)  Hepatitis C Screening recommended: no HIV Screening recommended: no  Advanced Directives: Written information was not prepared per patient's request.  Referrals & Orders Orders Placed This Encounter  Procedures   Mammogram 3D SCREEN BREAST BILATERAL    Follow-up Plan Follow-up with Hali Marry, MD as planned Need eye exam records.  Mammogram due after 02/21/22. Medicare wellness visit in one year.  AVS printed and mailed.   I have personally reviewed and noted the following in the patient's chart:   Medical and social history Use of alcohol, tobacco or illicit drugs  Current medications and supplements Functional ability and status Nutritional status Physical activity Advanced directives List of other physicians Hospitalizations, surgeries, and ER visits in previous 12 months Vitals Screenings to include cognitive, depression, and falls Referrals and appointments  In addition, I have reviewed and discussed with Amanda Morris certain preventive protocols, quality metrics, and best practice recommendations. A written personalized care plan for preventive services as well as general preventive health recommendations is available and can be mailed  to the patient at her request.      Tinnie Gens, RN BSN  02/03/2022

## 2022-02-03 NOTE — Patient Instructions (Addendum)
[facility] Maintenance Summary and Written Plan of Care  Ms. Amanda Morris ,  Thank you for allowing me to perform your Medicare Annual Wellness Visit and for your ongoing commitment to your health.   Health Maintenance & Immunization History Health Maintenance  Topic Date Due   OPHTHALMOLOGY EXAM  02/03/2022 (Originally 11/02/2019)   FOOT EXAM  02/04/2022 (Originally 01/22/2022)   COVID-19 Vaccine (6 - Mixed Product risk series) 03/19/2022   HEMOGLOBIN A1C  07/21/2022   Diabetic kidney evaluation - Urine ACR  10/21/2022   Diabetic kidney evaluation - GFR measurement  01/21/2023   MAMMOGRAM  02/22/2023   COLONOSCOPY (Pts 45-58yr Insurance coverage will need to be confirmed)  07/30/2026   TETANUS/TDAP  11/15/2029   Pneumonia Vaccine 69 Years old  Completed   INFLUENZA VACCINE  Completed   DEXA SCAN  Completed   Hepatitis C Screening  Completed   Zoster Vaccines- Shingrix  Completed   HPV VACCINES  Aged Out   Immunization History  Administered Date(s) Administered   Fluad Quad(high Dose 65+) 12/25/2020, 01/04/2022, 01/20/2022   Influenza Split 03/02/2011, 02/25/2012   Influenza, High Dose Seasonal PF 06/06/2017, 12/25/2017, 12/19/2018, 12/13/2019   Influenza,inj,Quad PF,6+ Mos 02/13/2015, 12/23/2015, 12/22/2016   Influenza-Unspecified 01/24/2013, 01/21/2014, 02/26/2015   Moderna Sars-Covid-2 Vaccination 06/08/2019, 07/06/2019   PFIZER Comirnaty(Gray Top)Covid-19 Tri-Sucrose Vaccine 01/22/2022   PFIZER(Purple Top)SARS-COV-2 Vaccination 03/25/2020   PNEUMOCOCCAL CONJUGATE-20 02/05/2021   PPD Test 02/16/2017   Pfizer Covid-19 Vaccine Bivalent Booster 112yr& up 07/05/2021   Pneumococcal Conjugate-13 02/14/2015   Pneumococcal Polysaccharide-23 05/12/2005, 01/17/2018, 07/15/2020   Td 07/07/2019   Tdap 04/27/2007, 07/30/2017, 11/16/2019   Zoster Recombinat (Shingrix) 04/01/2017, 08/10/2017    These are the patient goals that we discussed:  Goals Addressed                This Visit's Progress     Patient Stated (pt-stated)        Patient would like to loose 36 lbs.         This is a list of Health Maintenance Items that are overdue or due now: Foot exam Eye exam- had it completed. Need records.    Orders/Referrals Placed Today: Orders Placed This Encounter  Procedures   Mammogram 3D SCREEN BREAST BILATERAL    Standing Status:   Future    Standing Expiration Date:   02/04/2023    [place] Inst[place]     Please [place] to schedule.    Order Specific Question:   Reason for Exam (SYMPTOM  OR DIAGNOSIS REQUIRED)    Answer:   Breast cancer screening    Order Specific Question:   Preferred imaging location?    Answer:   External    (Contact our referral department at [phone] you have not spoken with someone about your referral appointment within the next 5 days)    Follow-up Plan Follow-up with MeHali MarryMD as planned Need eye exam records.  Mammogram due after 02/21/22. Medicare wellness visit in one year.  AVS printed and mailed.      Health Maintenance, Female Adopting a healthy lifestyle and getting preventive care are important in promoting health and wellness. Ask your health care provider about: The right schedule for you to have regular tests and exams. Things you can do on your own to prevent diseases and keep yourself healthy. What should I know about diet, weight, and exercise? Eat a healthy diet  Eat a diet that includes plenty of vegetables, fruits, low-fat dairy products, and lean protein. Do not eat a lot of foods that are high in solid fats, added sugars, or sodium. Maintain a healthy weight Body mass index (BMI) is used to identify weight problems. It estimates body fat based on height and weight. Your health care provider can help determine your BMI and help you achieve or maintain a healthy weight. Get regular exercise Get regular  exercise. This is one of the most important things you can do for your health. Most adults should: Exercise for at least 150 minutes each week. The exercise should increase your heart rate and make you sweat (moderate-intensity exercise). Do strengthening exercises at least twice a week. This is in addition to the moderate-intensity exercise. Spend less time sitting. Even light physical activity can be beneficial. Watch cholesterol and blood lipids Have your blood tested for lipids and cholesterol at 69 years of age, then have this test every 5 years. Have your cholesterol levels checked more often if: Your lipid or cholesterol levels are high. You are older than 69 years of age. You are at high risk for heart disease. What should I know about cancer screening? Depending on your health history and family history, you may need to have cancer screening at various ages. This may include screening for: Breast cancer. Cervical cancer. Colorectal cancer. Skin cancer. Lung cancer. What should I know about heart disease, diabetes, and high blood pressure? Blood pressure and heart disease High blood pressure causes heart disease and increases the risk of stroke. This is more likely to develop in people who have high blood pressure readings or are overweight. Have your blood pressure checked: Every 3-5 years if you are 65-43 years of age. Every year if you are 67 years old or older. Diabetes Have regular diabetes screenings. This checks your fasting blood sugar level. Have the screening done: Once every three years after age 47 if you are at a normal weight and have a low risk for diabetes. More often and at a younger age if you are overweight or have a high risk for diabetes. What should I know about preventing infection? Hepatitis B If you have a higher risk for hepatitis B, you should be screened for this virus. Talk with your health care provider to find out if you are at risk for hepatitis B  infection. Hepatitis C Testing is recommended for: Everyone born from [birth year] through 1963. Anyone with known risk factors for hepatitis C. Sexually transmitted infections (STIs) Get screened for STIs, including gonorrhea and chlamydia, if: You are sexually active and are younger than 69 years of age. You are older than 69 years of age and your health care provider tells you that you are at risk for this type of infection. Your sexual activity has changed since you were last screened, and you are at increased risk for chlamydia or gonorrhea. Ask your health care provider if you are at risk. Ask your health care provider about whether you are at high risk for HIV. Your health care provider may recommend a prescription medicine to help prevent HIV infection. If you choose to take medicine to prevent HIV, you should first get tested for HIV. You should then be tested every 3 months for as long as you are taking the medicine. Pregnancy If you are about to stop having your period (premenopausal) and you may become pregnant, seek counseling before you get pregnant. Take 400 to 800 micrograms (mcg)  of folic acid every day if you become pregnant. Ask for birth control (contraception) if you want to prevent pregnancy. Osteoporosis and menopause Osteoporosis is a disease in which the bones lose minerals and strength with aging. This can result in bone fractures. If you are 78 years old or older, or if you are at risk for osteoporosis and fractures, ask your health care provider if you should: Be screened for bone loss. Take a calcium or vitamin D supplement to lower your risk of fractures. Be given hormone replacement therapy (HRT) to treat symptoms of menopause. Follow these instructions at home: Alcohol use Do not drink alcohol if: Your health care provider tells you not to drink. You are pregnant, may be pregnant, or are planning to become pregnant. If you drink alcohol: Limit how much you have  to: 0-1 drink a day. Know how much alcohol is in your drink. In the [place] one drink equals one 12 oz bottle of beer (355 mL), one 5 oz glass of wine (148 mL), or one 1 oz glass of hard liquor (44 mL). Lifestyle Do not use any products that contain nicotine or tobacco. These products include cigarettes, chewing tobacco, and vaping devices, such as e-cigarettes. If you need help quitting, ask your health care provider. Do not use street drugs. Do not share needles. Ask your health care provider for help if you need support or information about quitting drugs. General instructions Schedule regular health, dental, and eye exams. Stay current with your vaccines. Tell your health care provider if: You often feel depressed. You have ever been abused or do not feel safe at home. Summary Adopting a healthy lifestyle and getting preventive care are important in promoting health and wellness. Follow your health care provider's instructions about healthy diet, exercising, and getting tested or screened for diseases. Follow your health care provider's instructions on monitoring your cholesterol and blood pressure. This information is not intended to replace advice given to you by your health care provider. Make sure you discuss any questions you have with your health care provider. Document Revised: 09/01/2020 Document Reviewed: 09/01/2020 Elsevier Patient Education  [place] Maintenance, Female Adopting a healthy lifestyle and getting preventive care are important in promoting health and wellness. Ask your health care provider about: The right schedule for you to have regular tests and exams. Things you can do on your own to prevent diseases and keep yourself healthy. What should I know about diet, weight, and exercise? Eat a healthy diet  Eat a diet that includes plenty of vegetables, fruits, low-fat dairy products, and lean protein. Do not eat a lot of foods that are high in  solid fats, added sugars, or sodium. Maintain a healthy weight Body mass index (BMI) is used to identify weight problems. It estimates body fat based on height and weight. Your health care provider can help determine your BMI and help you achieve or maintain a healthy weight. Get regular exercise Get regular exercise. This is one of the most important things you can do for your health. Most adults should: Exercise for at least 150 minutes each week. The exercise should increase your heart rate and make you sweat (moderate-intensity exercise). Do strengthening exercises at least twice a week. This is in addition to the moderate-intensity exercise. Spend less time sitting. Even light physical activity can be beneficial. Watch cholesterol and blood lipids Have your blood tested for lipids and cholesterol at 69 years of age, then have this test every  5 years. Have your cholesterol levels checked more often if: Your lipid or cholesterol levels are high. You are older than 69 years of age. You are at high risk for heart disease. What should I know about cancer screening? Depending on your health history and family history, you may need to have cancer screening at various ages. This may include screening for: Breast cancer. Cervical cancer. Colorectal cancer. Skin cancer. Lung cancer. What should I know about heart disease, diabetes, and high blood pressure? Blood pressure and heart disease High blood pressure causes heart disease and increases the risk of stroke. This is more likely to develop in people who have high blood pressure readings or are overweight. Have your blood pressure checked: Every 3-5 years if you are 48-71 years of age. Every year if you are 35 years old or older. Diabetes Have regular diabetes screenings. This checks your fasting blood sugar level. Have the screening done: Once every three years after age 31 if you are at a normal weight and have a low risk for  diabetes. More often and at a younger age if you are overweight or have a high risk for diabetes. What should I know about preventing infection? Hepatitis B If you have a higher risk for hepatitis B, you should be screened for this virus. Talk with your health care provider to find out if you are at risk for hepatitis B infection. Hepatitis C Testing is recommended for: Everyone born from [birth year] through 1963. Anyone with known risk factors for hepatitis C. Sexually transmitted infections (STIs) Get screened for STIs, including gonorrhea and chlamydia, if: You are sexually active and are younger than 69 years of age. You are older than 69 years of age and your health care provider tells you that you are at risk for this type of infection. Your sexual activity has changed since you were last screened, and you are at increased risk for chlamydia or gonorrhea. Ask your health care provider if you are at risk. Ask your health care provider about whether you are at high risk for HIV. Your health care provider may recommend a prescription medicine to help prevent HIV infection. If you choose to take medicine to prevent HIV, you should first get tested for HIV. You should then be tested every 3 months for as long as you are taking the medicine. Pregnancy If you are about to stop having your period (premenopausal) and you may become pregnant, seek counseling before you get pregnant. Take 400 to 800 micrograms (mcg) of folic acid every day if you become pregnant. Ask for birth control (contraception) if you want to prevent pregnancy. Osteoporosis and menopause Osteoporosis is a disease in which the bones lose minerals and strength with aging. This can result in bone fractures. If you are 10 years old or older, or if you are at risk for osteoporosis and fractures, ask your health care provider if you should: Be screened for bone loss. Take a calcium or vitamin D supplement to lower your risk of  fractures. Be given hormone replacement therapy (HRT) to treat symptoms of menopause. Follow these instructions at home: Alcohol use Do not drink alcohol if: Your health care provider tells you not to drink. You are pregnant, may be pregnant, or are planning to become pregnant. If you drink alcohol: Limit how much you have to: 0-1 drink a day. Know how much alcohol is in your drink. In the [place] one drink equals one 12 oz bottle of beer (355 mL),  one 5 oz glass of wine (148 mL), or one 1 oz glass of hard liquor (44 mL). Lifestyle Do not use any products that contain nicotine or tobacco. These products include cigarettes, chewing tobacco, and vaping devices, such as e-cigarettes. If you need help quitting, ask your health care provider. Do not use street drugs. Do not share needles. Ask your health care provider for help if you need support or information about quitting drugs. General instructions Schedule regular health, dental, and eye exams. Stay current with your vaccines. Tell your health care provider if: You often feel depressed. You have ever been abused or do not feel safe at home. Summary Adopting a healthy lifestyle and getting preventive care are important in promoting health and wellness. Follow your health care provider's instructions about healthy diet, exercising, and getting tested or screened for diseases. Follow your health care provider's instructions on monitoring your cholesterol and blood pressure. This information is not intended to replace advice given to you by your health care provider. Make sure you discuss any questions you have with your health care provider. Document Revised: 09/01/2020 Document Reviewed: 09/01/2020 Elsevier Patient Education  [place].

## 2022-02-05 DIAGNOSIS — M5386 Other specified dorsopathies, lumbar region: Secondary | ICD-10-CM | POA: Diagnosis not present

## 2022-02-05 DIAGNOSIS — M545 Low back pain, unspecified: Secondary | ICD-10-CM | POA: Diagnosis not present

## 2022-02-05 DIAGNOSIS — R293 Abnormal posture: Secondary | ICD-10-CM | POA: Diagnosis not present

## 2022-02-05 DIAGNOSIS — Z4789 Encounter for other orthopedic aftercare: Secondary | ICD-10-CM | POA: Diagnosis not present

## 2022-02-05 DIAGNOSIS — R2689 Other abnormalities of gait and mobility: Secondary | ICD-10-CM | POA: Diagnosis not present

## 2022-02-08 ENCOUNTER — Other Ambulatory Visit: Payer: Self-pay

## 2022-02-08 DIAGNOSIS — E119 Type 2 diabetes mellitus without complications: Secondary | ICD-10-CM

## 2022-02-08 MED ORDER — ACCU-CHEK SOFTCLIX LANCETS MISC: 91 refills | Status: DC

## 2022-02-08 MED ORDER — ACCU-CHEK GUIDE VI STRP: ORAL_STRIP | 33 refills | Status: DC

## 2022-02-12 DIAGNOSIS — M5386 Other specified dorsopathies, lumbar region: Secondary | ICD-10-CM | POA: Diagnosis not present

## 2022-02-12 DIAGNOSIS — M545 Low back pain, unspecified: Secondary | ICD-10-CM | POA: Diagnosis not present

## 2022-02-12 DIAGNOSIS — R2689 Other abnormalities of gait and mobility: Secondary | ICD-10-CM | POA: Diagnosis not present

## 2022-02-12 DIAGNOSIS — R293 Abnormal posture: Secondary | ICD-10-CM | POA: Diagnosis not present

## 2022-02-12 DIAGNOSIS — Z4789 Encounter for other orthopedic aftercare: Secondary | ICD-10-CM | POA: Diagnosis not present

## 2022-02-13 ENCOUNTER — Other Ambulatory Visit: Payer: Self-pay | Admitting: Family Medicine

## 2022-02-13 DIAGNOSIS — E1169 Type 2 diabetes mellitus with other specified complication: Secondary | ICD-10-CM

## 2022-02-19 DIAGNOSIS — R2689 Other abnormalities of gait and mobility: Secondary | ICD-10-CM | POA: Diagnosis not present

## 2022-02-19 DIAGNOSIS — Z723 Lack of physical exercise: Secondary | ICD-10-CM | POA: Diagnosis not present

## 2022-02-19 DIAGNOSIS — Z4789 Encounter for other orthopedic aftercare: Secondary | ICD-10-CM | POA: Diagnosis not present

## 2022-02-19 DIAGNOSIS — M545 Low back pain, unspecified: Secondary | ICD-10-CM | POA: Diagnosis not present

## 2022-02-19 DIAGNOSIS — Z981 Arthrodesis status: Secondary | ICD-10-CM | POA: Diagnosis not present

## 2022-02-19 DIAGNOSIS — R293 Abnormal posture: Secondary | ICD-10-CM | POA: Diagnosis not present

## 2022-02-19 DIAGNOSIS — M5386 Other specified dorsopathies, lumbar region: Secondary | ICD-10-CM | POA: Diagnosis not present

## 2022-02-25 DIAGNOSIS — M549 Dorsalgia, unspecified: Secondary | ICD-10-CM | POA: Diagnosis not present

## 2022-02-25 DIAGNOSIS — S39012A Strain of muscle, fascia and tendon of lower back, initial encounter: Secondary | ICD-10-CM | POA: Diagnosis not present

## 2022-03-01 ENCOUNTER — Other Ambulatory Visit: Payer: Self-pay | Admitting: Family Medicine

## 2022-03-01 DIAGNOSIS — R600 Localized edema: Secondary | ICD-10-CM

## 2022-03-02 NOTE — Telephone Encounter (Signed)
I agree, we stopped hctz bc of low potassium and low BP

## 2022-03-02 NOTE — Telephone Encounter (Signed)
Last note from you states patient should stop this medication. Please advise.

## 2022-03-09 DIAGNOSIS — M533 Sacrococcygeal disorders, not elsewhere classified: Secondary | ICD-10-CM | POA: Diagnosis not present

## 2022-03-09 DIAGNOSIS — S39012A Strain of muscle, fascia and tendon of lower back, initial encounter: Secondary | ICD-10-CM | POA: Diagnosis not present

## 2022-03-09 DIAGNOSIS — M549 Dorsalgia, unspecified: Secondary | ICD-10-CM | POA: Diagnosis not present

## 2022-03-10 ENCOUNTER — Ambulatory Visit (INDEPENDENT_AMBULATORY_CARE_PROVIDER_SITE_OTHER): Payer: Medicare Other | Admitting: Pharmacist

## 2022-03-10 DIAGNOSIS — I10 Essential (primary) hypertension: Secondary | ICD-10-CM

## 2022-03-10 DIAGNOSIS — E1169 Type 2 diabetes mellitus with other specified complication: Secondary | ICD-10-CM

## 2022-03-10 DIAGNOSIS — E785 Hyperlipidemia, unspecified: Secondary | ICD-10-CM

## 2022-03-10 DIAGNOSIS — Z794 Long term (current) use of insulin: Secondary | ICD-10-CM

## 2022-03-10 NOTE — Progress Notes (Signed)
Chronic Care Management Pharmacy Note  03/10/2022 Name:  Amanda Morris MRN:  [id number] DOB:  1950/09/08  Summary: addressed DM, HTN, HLD. She is receiving ozempic via patient assistance, began 0.3m weekly on 12/30/21. She has come entirely off insulin, and reducing her glipizide dosing.  She is taking glipizide 586mBID, ozempic 0.2548meekly, jardiance 46m62mily.  BG readings at home:  57 on Nov 4th at dinner time.  74-170s 94,106, 116, 113, 104, 179.   She does note that her BG sometimes runs in 200s after eating, if she eats something with more carb content.    Recommendations/Changes made from today's visit:  - No changes, continue current medication. Patient does not wish to attempt stopping glipizide at this time. - Provided application for [id number] patient assistance renewal and ozempic 0.46mg28m sample in our fridge for her to pick up. She states she is low on pen needles for the ozempic, will provide sample box. Instructed patient to fill out patient section and return to our office. - Eventual goal: increase ozempic to 0.5mg w20mly (only if needed), stop glipizide entirely.   Plan: f/u with patient in 1 month  Subjective: Amanda Morris 71 y.o52year old female who is a primary patient of Metheney, CatherRene Kocher The CCM team was consulted for assistance with disease management and care coordination needs.    Engaged with patient by telephone for follow up visit in response to provider referral for pharmacy case management and/or care coordination services.   Consent to Services:  The patient was given the following information about Chronic Care Management services today, agreed to services, and gave verbal consent: 1. CCM service includes personalized support from designated clinical staff supervised by the primary care provider, including individualized plan of care and coordination with other care providers 2. 24/7 contact phone numbers for  assistance for urgent and routine care needs. 3. Service will only be billed when office clinical staff spend 20 minutes or more in a month to coordinate care. 4. Only one practitioner may furnish and bill the service in a calendar month. 5.The patient may stop CCM services at any time (effective at the end of the month) by phone call to the office staff. 6. The patient will be responsible for cost sharing (co-pay) of up to 20% of the service fee (after annual deductible is met). Patient agreed to services and consent obtained.  Patient Care Team: MethenHali Marrys PCP - General (Family Medicine) Weeks,Eulis CannerGastroenterology) ThekkeSilverio Decamps Consulting Physician (Family Medicine) [place] Hagger,Darius Bump([facility])  Recent office visits:  06/08/21-Catherine D. MethenMadilyn FiremanPCP) Seen for new referral for 2nd opinion for back surgery. Increase insulin to 28 units nightly for 1 week.  [facility]. 04/23/21-[name], MD (PCP) Seen for a diabetic follow up visit and Gastroesophageal Reflux. Ambulatory referral to Gastroenterology. Increase her Jardiance back to a whole tab daily and then decrease her insulin by 2 units. Labs ordered. Follow up in 3 months.  01/26/21-Catherine D. MethenMadilyn FiremanPCP) Medicare annual wellness visit. Follow up in 1 year.  01/22/21-Catherine D. MethenMadilyn FiremanPCP) Seen for follow up visit. Decrease glipizide to 5 mg twice daily. Labs ordered. Follow up in 3 months. 12/25/20-Catherine D. MethenMadilyn FiremanPCP) Seen for foot pain. Labs ordered. Flu vaccine given. Start over the counter Iron supplement. Start on Hydrochlorothiazide 25 mg. Follow  up in 4 weeks.    Recent consult visits:  05/25/21-Karl A. Pleasant, PA (Gastroenterology) Seen for abdominal pain.  03/10/21-Kyle L. Christella Noa, MD (Neurosurgery) Notes not available. 02/23/21-Yijun Krista Blue, MD (Neurology) Follow up  visit.  01/26/21-Karl A. Pleasant, PA (Gastroenterology) Labs ordered. Follow up in 3 months. 12/30/20-Matthew R. Jacqualyn Posey, DPM (Podiatry) Sen for nail problem. ABI ordered. Follow up in 3 months.   Hospital visits:  None in previous 6 months  Objective:  Lab Results  Component Value Date   CREATININE 0.83 01/20/2022   CREATININE 0.80 07/21/2021   CREATININE 0.73 12/25/2020    Lab Results  Component Value Date   HGBA1C 5.7 (A) 01/20/2022   Last diabetic Eye exam:  Lab Results  Component Value Date/Time   HMDIABEYEEXA No Retinopathy 11/02/2018 12:00 AM    Last diabetic Foot exam: No results found for: "HMDIABFOOTEX"      Component Value Date/Time   CHOL 120 07/21/2021 0000   TRIG 91 07/21/2021 0000   HDL 45 (L) 07/21/2021 0000   CHOLHDL 2.7 07/21/2021 0000   VLDL 20 10/13/2020 1759   LDLCALC 58 07/21/2021 0000       Latest Ref Rng & Units 01/20/2022   12:00 AM 12/25/2020   12:00 AM 10/15/2020    3:43 AM  Hepatic Function  Total Protein 6.1 - 8.1 g/dL 7.3  7.3  5.2   Albumin 3.5 - 5.0 g/dL   2.4   AST 10 - 35 U/L 38  18  24   ALT 6 - 29 U/L 35  16  22   Alk Phosphatase 38 - 126 U/L   52   Total Bilirubin 0.2 - 1.2 mg/dL 0.5  0.3  0.1     Lab Results  Component Value Date/Time   TSH 1.73 12/25/2020 12:00 AM   TSH 1.650 11/28/2019 08:54 AM       Latest Ref Rng & Units 12/25/2020   12:00 AM 11/18/2020   12:00 AM 10/15/2020    3:43 AM  CBC  WBC 3.8 - 10.8 Thousand/uL 12.9  10.2  8.2   Hemoglobin 11.7 - 15.5 g/dL 11.3  11.1  9.2   Hematocrit 35.0 - 45.0 % 39.7  38.5  29.7   Platelets 140 - 400 Thousand/uL 350  429  233     Lab Results  Component Value Date/Time   VD25OH 33.5 11/28/2019 08:54 AM    Social History   Tobacco Use  Smoking Status Former   Types: Cigarettes   Quit date: 07/30/2007   Years since quitting: 14.6  Smokeless Tobacco Never   BP Readings from Last 3 Encounters:  01/20/22 109/73  10/20/21 121/79  07/21/21 (!) 104/56   Pulse  Readings from Last 3 Encounters:  01/20/22 90  10/20/21 96  07/21/21 84   Wt Readings from Last 3 Encounters:  01/20/22 190 lb (86.2 kg)  10/20/21 203 lb (92.1 kg)  07/21/21 213 lb (96.6 kg)    Assessment: Review of patient past medical history, allergies, medications, health status, including review of consultants reports, laboratory and other test data, was performed as part of comprehensive evaluation and provision of chronic care management services.   SDOH:  (Social Determinants of Health) assessments and interventions performed:  SDOH Interventions    Flowsheet Row Office Visit from 01/26/2021 in [place] Most recent reading at 01/26/2021  4:13 PM Office Visit from 02/05/2019 in [place] Most recent reading at 02/05/2019 11:27  AM Clinical Support from 07/11/2018 in [place] Most recent reading at 07/11/2018  2:12 PM Office Visit from 07/11/2018 in [place] Most recent reading at 07/11/2018  9:10 AM  SDOH Interventions      Food Insecurity Interventions Intervention Not Indicated -- -- --  Housing Interventions Intervention Not Indicated -- -- --  Transportation Interventions Intervention Not Indicated -- -- --  Depression Interventions/Treatment  -- Currently on Treatment Medication Currently on Treatment  Financial Strain Interventions Intervention Not Indicated -- -- --  Physical Activity Interventions Intervention Not Indicated -- -- --  Stress Interventions Intervention Not Indicated -- -- --  Social Connections Interventions Intervention Not Indicated -- -- --       CCM Care Plan  Allergies  Allergen Reactions   Penicillins Hives and Other (See Comments)    PATIENT HAS HAD A PCN REACTION WITH IMMEDIATE RASH, FACIAL/TONGUE/THROAT SWELLING, SOB, OR LIGHTHEADEDNESS WITH HYPOTENSION:  #  #  YES  #  #  Has patient had a PCN  reaction causing severe rash involving mucus membranes or skin necrosis: No Has patient had a PCN reaction that required hospitalization: No Has patient had a PCN reaction occurring within the last 10 years: No If all of the above answers are "NO", then may proceed with Cephalosporin use.    Duloxetine Other (See Comments)    GI upset   Tramadol Other (See Comments)    Nervousness, causes hands to shake   Aspirin Nausea Only   Codeine Itching   Hydrocodone Nausea Only and Other (See Comments)    Nose bleed   Metformin And Related Diarrhea and Nausea Only   Oxycodone Nausea And Vomiting    GI Upset (intolerance)   Oxycodone-Acetaminophen Nausea Only   Victoza [Liraglutide] Other (See Comments)    Abdominal pain    Medications Reviewed Today     Reviewed by Tinnie Gens, RN (Registered Nurse) on 02/03/22 at 1001  Med List Status: <None>   Medication Order Taking? Sig Documenting Provider Last Dose Status Informant  Accu-Chek Softclix Lancets lancets [id number] Yes DX:E11.9 USE AS DIRECTED Hali Marry, MD Taking Active   acetaminophen (TYLENOL) 650 MG CR tablet [id number] Yes Take 650 mg by mouth every 8 (eight) hours as needed for pain. [provider] Taking Active   aspirin EC 81 MG tablet [id number] Yes Take 1 tablet (81 mg total) by mouth daily. Traci Sermon, PA-C Taking Active Multiple Informants           Med Note Maryruth Eve, Leanna Battles   Thu Dec 25, 2020 10:27 AM)    atorvastatin (LIPITOR) 40 MG tablet [id number] Yes Take 1 tablet (40 mg total) by mouth at bedtime. Hali Marry, MD Taking Active   Blood Glucose Monitoring Suppl (ACCU-CHEK AVIVA PLUS) w/Device Drucie Opitz [id number] Yes Check blood sugars 3 times daily DX:E11.9 Hali Marry, MD Taking Active Multiple Informants  cholecalciferol (VITAMIN D) 1000 units tablet [id number] Yes Take 1,000 Units by mouth daily. [provider] Taking Active Multiple Informants  dexlansoprazole  (DEXILANT) 60 MG capsule [id number] Yes Take 60 mg by mouth daily. [provider] Taking Active Multiple Informants  dicyclomine (BENTYL) 20 MG tablet [id number] Yes Take 20 mg by mouth every 6 (six) hours as needed for spasms (cramps/stomach pain). [provider] Taking Active Multiple Informants  doxepin (SINEQUAN) 10 MG capsule [id number] Yes Take 10 mg by mouth at bedtime. [provider] Taking Active  Multiple Informants  famotidine (PEPCID) 20 MG tablet [id number] Yes Take by mouth. [provider] Taking Active   Ferrous Sulfate ([facility] SLOW RELEASE IRON PO) [id number] Yes Take 1 tablet by mouth daily. [provider] Taking Active   fluticasone (FLONASE) 50 MCG/ACT nasal spray [id number] Yes SPRAY 1 SPRAY INTO BOTH NOSTRILS DAILY. Hali Marry, MD Taking Active   glipiZIDE (GLUCOTROL) 5 MG tablet [id number] Yes Take 1 tablet (5 mg total) by mouth 2 (two) times daily before a meal. Hali Marry, MD Taking Active   glucose blood (ACCU-CHEK GUIDE) test strip [id number] Yes CHECK BLOOD SUGAR 3 TIMES DAILY. DX: E11.9 Hali Marry, MD Taking Active   hydrochlorothiazide (HYDRODIURIL) 25 MG tablet [id number] Yes TAKE 1 TABLET (25 MG TOTAL) BY MOUTH DAILY AS NEEDED. FOR SWELLING Hali Marry, MD Taking Active   JARDIANCE 25 MG TABS tablet [id number] Yes TAKE 25 MG BY MOUTH DAILY. Hali Marry, MD Taking Active   Krill Oil 500 MG CAPS [id number] Yes Take 500 mg by mouth daily. [provider] Taking Active Multiple Informants           Med Note Arbutus Ped Jan 22, 2021  8:48 AM)    levocetirizine (XYZAL) 5 MG tablet [id number] Yes TAKE 1 TABLET BY MOUTH EVERY DAY IN THE Erskine Emery, MD Taking Active   Multiple Vitamin (MULTIVITAMIN WITH MINERALS) TABS tablet [id number] Yes Take 1 tablet by mouth daily. [provider] Taking Active Multiple Informants           Med Note Arbutus Ped Jan 22, 2021  8:48 AM)    ondansetron (ZOFRAN) 4 MG tablet [id number] Yes Take 1 tablet (4 mg total) by mouth every 8 (eight) hours as needed for nausea or vomiting. Hali Marry, MD Taking Active   OVER THE COUNTER MEDICATION [id number] Yes Take 1 tablet by mouth 3 (three) times daily. Golo otc weight loss supplement [provider] Taking Active Multiple Informants           Med Note Maryruth Eve, Leanna Battles   Thu Jan 22, 2021  8:48 AM)    pregabalin (LYRICA) 75 MG capsule [id number] Yes Take 1 capsule (75 mg total) by mouth 2 (two) times daily. Hali Marry, MD Taking Active   Semaglutide [facility], 0.25 OR 0.5 MG/DOSE, [place]) [id number] Yes Inject 0.25 mg into the skin every 7 (seven) days. [provider] Taking Active   sucralfate (CARAFATE) 1 g tablet [id number] Yes TAKE 1 TABLET (1 G TOTAL) BY MOUTH 4 TIMES A DAY WITH MEALS AND AT BEDTIME Hali Marry, MD Taking Active             Patient Active Problem List   Diagnosis Date Noted   Left hip pain 02/23/2021   Low back pain 02/23/2021   Gait abnormality 02/23/2021   Bilateral lower extremity edema 01/22/2021   Elevated CK 10/12/2020   Spinal stenosis 10/09/2020   Lumbar adjacent segment disease with spondylolisthesis 10/09/2020   Spinal stenosis, lumbar region with neurogenic claudication 04/16/2020   Peripheral neuropathy 11/28/2019   Paresthesia and pain of both upper extremities 11/12/2019   Chronic midline low back pain with bilateral sciatica 10/30/2019   Left thigh pain 10/30/2019   Numbness 10/30/2019   Meralgia paresthetica, left 09/12/2019   Tennis Must Quervain's disease (tenosynovitis) 02/06/2019   Acute bilateral low back pain without sciatica 12/01/2018   Orthostatic hypotension 12/01/2018  Failed back syndrome of lumbar spine 11/25/2017   Inflammation of sacroiliac joint ([place]) 06/07/2017   Displacement of lumbar intervertebral disc 11/22/2016   Fatty liver 09/27/2016    Status post bilateral breast reduction 03/23/2016   Hypertrophy of breast 08/12/2015   Spondylolisthesis of lumbar region 06/13/2015   History of lumbar fusion 04/07/2015   Radiculopathy, lumbar region 04/07/2015   Essential hypertension 02/14/2015   Postprandial vomiting 02/04/2014   Obesity (BMI 30-39.9) 11/01/2013   Carotid artery stenosis 06/07/2013   Hereditary and idiopathic peripheral neuropathy 06/07/2013   History of migraine 05/04/2013   Type 2 diabetes mellitus with other specified complication ([place]) 43/83/8184   SVT (supraventricular tachycardia) 11/03/2010   GERD (gastroesophageal reflux disease) 10/29/2010   Chronic low back pain 10/29/2010   Hyperlipidemia associated with type 2 diabetes mellitus ([place]) 10/29/2010    Immunization History  Administered Date(s) Administered   Fluad Quad(high Dose 65+) 12/25/2020, 01/04/2022, 01/20/2022   Influenza Split 03/02/2011, 02/25/2012   Influenza, High Dose Seasonal PF 06/06/2017, 12/25/2017, 12/19/2018, 12/13/2019   Influenza,inj,Quad PF,6+ Mos 02/13/2015, 12/23/2015, 12/22/2016   Influenza-Unspecified 01/24/2013, 01/21/2014, 02/26/2015   Moderna Sars-Covid-2 Vaccination 06/08/2019, 07/06/2019   PFIZER Comirnaty(Gray Top)Covid-19 Tri-Sucrose Vaccine 01/22/2022   PFIZER(Purple Top)SARS-COV-2 Vaccination 03/25/2020   PNEUMOCOCCAL CONJUGATE-20 02/05/2021   PPD Test 02/16/2017   Pfizer Covid-19 Vaccine Bivalent Booster 48yr & up 07/05/2021   Pneumococcal Conjugate-13 02/14/2015   Pneumococcal Polysaccharide-23 05/12/2005, 01/17/2018, 07/15/2020   Td 07/07/2019   Tdap 04/27/2007, 07/30/2017, 11/16/2019   Zoster Recombinat (Shingrix) 04/01/2017, 08/10/2017    Conditions to be addressed/monitored: HTN, HLD, and DMII   Medication Assistance: Application for novonordisk  medication assistance program. in process.  Anticipated assistance start date TBD.  See plan of care for additional detail.  Patient's preferred pharmacy  is:  [id number] [address] [id number]: [phone]: [phone] pill box? Yes Pt endorses 80% compliance  Follow Up:  Patient agrees to Care Plan and Follow-up.  Plan: Telephone follow up appointment with care management team member scheduled for:  1 month  KeLarinda ButteryPharmD Clinical Pharmacist [phone]

## 2022-03-12 DIAGNOSIS — R293 Abnormal posture: Secondary | ICD-10-CM | POA: Diagnosis not present

## 2022-03-12 DIAGNOSIS — Z723 Lack of physical exercise: Secondary | ICD-10-CM | POA: Diagnosis not present

## 2022-03-12 DIAGNOSIS — R2689 Other abnormalities of gait and mobility: Secondary | ICD-10-CM | POA: Diagnosis not present

## 2022-03-12 DIAGNOSIS — Z4789 Encounter for other orthopedic aftercare: Secondary | ICD-10-CM | POA: Diagnosis not present

## 2022-03-12 DIAGNOSIS — M5386 Other specified dorsopathies, lumbar region: Secondary | ICD-10-CM | POA: Diagnosis not present

## 2022-03-12 DIAGNOSIS — Z981 Arthrodesis status: Secondary | ICD-10-CM | POA: Diagnosis not present

## 2022-03-12 DIAGNOSIS — M545 Low back pain, unspecified: Secondary | ICD-10-CM | POA: Diagnosis not present

## 2022-03-26 ENCOUNTER — Telehealth: Payer: Self-pay

## 2022-03-26 DIAGNOSIS — M545 Low back pain, unspecified: Secondary | ICD-10-CM | POA: Diagnosis not present

## 2022-03-26 DIAGNOSIS — Z723 Lack of physical exercise: Secondary | ICD-10-CM | POA: Diagnosis not present

## 2022-03-26 DIAGNOSIS — M5386 Other specified dorsopathies, lumbar region: Secondary | ICD-10-CM | POA: Diagnosis not present

## 2022-03-26 DIAGNOSIS — R293 Abnormal posture: Secondary | ICD-10-CM | POA: Diagnosis not present

## 2022-03-26 DIAGNOSIS — Z4789 Encounter for other orthopedic aftercare: Secondary | ICD-10-CM | POA: Diagnosis not present

## 2022-03-26 DIAGNOSIS — R2689 Other abnormalities of gait and mobility: Secondary | ICD-10-CM | POA: Diagnosis not present

## 2022-03-26 DIAGNOSIS — Z981 Arthrodesis status: Secondary | ICD-10-CM | POA: Diagnosis not present

## 2022-03-26 DIAGNOSIS — M549 Dorsalgia, unspecified: Secondary | ICD-10-CM | POA: Diagnosis not present

## 2022-03-26 NOTE — Telephone Encounter (Signed)
Routing to provider and MA as an FYI:  Received PAP delivery for Ozempic, 2 pens.  [phone] 2023-10-24 [id number]  Patient has been updated and notified that Ozempic is available for pick up. Per patient, she will stop by on Monday 03/29/22.

## 2022-03-27 ENCOUNTER — Other Ambulatory Visit: Payer: Self-pay | Admitting: Family Medicine

## 2022-03-29 NOTE — Telephone Encounter (Signed)
Patient picked up the [place] delivery. (2 Pens)   [phone]  2023-10-24 [id number]   Patient dropped paperwork for Ozempic medication for [facility], placed in basket.

## 2022-03-31 ENCOUNTER — Telehealth: Payer: Self-pay | Admitting: Pharmacist

## 2022-03-31 NOTE — Telephone Encounter (Signed)
Pharmacy Documentation  Processed patient assistance re-enrollment for 2024, ozempic 0.'25mg'$  weekly.  Awaiting provider signature, fax, and scan into media tab.   Larinda Buttery, PharmD Clinical Pharmacist [facility] [phone]

## 2022-04-02 ENCOUNTER — Telehealth: Payer: Self-pay | Admitting: *Deleted

## 2022-04-02 NOTE — Telephone Encounter (Signed)
Form completed,faxed,confirmation received and scanned into patient's chart.

## 2022-04-08 ENCOUNTER — Ambulatory Visit (INDEPENDENT_AMBULATORY_CARE_PROVIDER_SITE_OTHER): Payer: Medicare Other | Admitting: Podiatrist

## 2022-04-08 ENCOUNTER — Encounter: Payer: Self-pay | Admitting: Podiatrist

## 2022-04-08 DIAGNOSIS — M79675 Pain in left toe(s): Secondary | ICD-10-CM

## 2022-04-08 DIAGNOSIS — E1169 Type 2 diabetes mellitus with other specified complication: Secondary | ICD-10-CM

## 2022-04-08 DIAGNOSIS — B351 Tinea unguium: Secondary | ICD-10-CM

## 2022-04-08 DIAGNOSIS — M79674 Pain in right toe(s): Secondary | ICD-10-CM | POA: Diagnosis not present

## 2022-04-08 NOTE — Progress Notes (Signed)
Subjective: 71 y.o. returns the office today for painful, elongated, thickened toenails which she cannot trim herself as well as calluses. No open lesions reported she still gets thick callus to the bottom of her left heel.     Denies any fevers, chills, nausea, vomiting.  No other concerns today.  PCP: Hali Marry, MD   Objective: AAO 3, NAD DP pulse 2/4 but difficult to palpate PT pulse there is mild present to the ankle., CRT less than 3 seconds Nails hypertrophic, dystrophic, elongated, brittle, discolored 10. There is tenderness overlying the nails 1-5 bilaterally. No edema, erythema or signs of infection. Hyperkeratotic tissue bilateral hallux and submetatarsal 1 and left plantar heel.  No underlying ulceration drainage or signs of infection noted today.  No evidence of foreign body. No pain with calf compression, swelling, warmth, erythema.  Assessment: Patient presents with symptomatic onychomycosis; hyperkeratotic lesions  Plan: -Treatment options including alternatives, risks, complications were discussed -Nails sharply debrided 10 without complication/bleeding.  -Hyperkeratotic lesion sharply debrided x4 without complications or bleeding. Offloading and moisturizer daily.  -Follow-up in 3 months or sooner if any problems are to arise. In the meantime, encouraged to call the office with any questions, concerns, changes symptoms.

## 2022-04-09 ENCOUNTER — Ambulatory Visit: Payer: Medicare Other | Admitting: Podiatrist

## 2022-04-09 DIAGNOSIS — M549 Dorsalgia, unspecified: Secondary | ICD-10-CM | POA: Diagnosis not present

## 2022-04-09 DIAGNOSIS — M545 Low back pain, unspecified: Secondary | ICD-10-CM | POA: Diagnosis not present

## 2022-04-09 DIAGNOSIS — Z981 Arthrodesis status: Secondary | ICD-10-CM | POA: Diagnosis not present

## 2022-04-09 DIAGNOSIS — R293 Abnormal posture: Secondary | ICD-10-CM | POA: Diagnosis not present

## 2022-04-09 DIAGNOSIS — M5386 Other specified dorsopathies, lumbar region: Secondary | ICD-10-CM | POA: Diagnosis not present

## 2022-04-09 DIAGNOSIS — Z723 Lack of physical exercise: Secondary | ICD-10-CM | POA: Diagnosis not present

## 2022-04-09 DIAGNOSIS — Z4789 Encounter for other orthopedic aftercare: Secondary | ICD-10-CM | POA: Diagnosis not present

## 2022-04-09 DIAGNOSIS — R2689 Other abnormalities of gait and mobility: Secondary | ICD-10-CM | POA: Diagnosis not present

## 2022-04-16 DIAGNOSIS — M545 Low back pain, unspecified: Secondary | ICD-10-CM | POA: Diagnosis not present

## 2022-04-16 DIAGNOSIS — R293 Abnormal posture: Secondary | ICD-10-CM | POA: Diagnosis not present

## 2022-04-16 DIAGNOSIS — M5386 Other specified dorsopathies, lumbar region: Secondary | ICD-10-CM | POA: Diagnosis not present

## 2022-04-16 DIAGNOSIS — Z4789 Encounter for other orthopedic aftercare: Secondary | ICD-10-CM | POA: Diagnosis not present

## 2022-04-16 DIAGNOSIS — M549 Dorsalgia, unspecified: Secondary | ICD-10-CM | POA: Diagnosis not present
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
## 2022-04-21 ENCOUNTER — Telehealth: Payer: Self-pay | Admitting: *Deleted

## 2022-04-21 ENCOUNTER — Ambulatory Visit (INDEPENDENT_AMBULATORY_CARE_PROVIDER_SITE_OTHER): Payer: Medicare Other | Admitting: Family Medicine

## 2022-04-21 ENCOUNTER — Encounter: Payer: Self-pay | Admitting: Family Medicine

## 2022-04-21 VITALS — BP 96/83 | HR 88 | Ht 64.0 in | Wt 187.0 lb

## 2022-04-21 DIAGNOSIS — Z794 Long term (current) use of insulin: Secondary | ICD-10-CM

## 2022-04-21 DIAGNOSIS — G6289 Other specified polyneuropathies: Secondary | ICD-10-CM

## 2022-04-21 DIAGNOSIS — I10 Essential (primary) hypertension: Secondary | ICD-10-CM

## 2022-04-21 DIAGNOSIS — M48062 Spinal stenosis, lumbar region with neurogenic claudication: Secondary | ICD-10-CM

## 2022-04-21 DIAGNOSIS — E1169 Type 2 diabetes mellitus with other specified complication: Secondary | ICD-10-CM

## 2022-04-21 LAB — POCT GLYCOSYLATED HEMOGLOBIN (HGB A1C): Hemoglobin A1C: 6.5 % — AB (ref 4.0–5.6)

## 2022-04-21 NOTE — Progress Notes (Signed)
Established Patient Office Visit  Subjective   Patient ID: Amanda Morris, female    DOB: Sep 23, 1950  Age: 71 y.o. MRN: [id number]  Chief Complaint  Patient presents with   Diabetes    Eye exam done at [facility] in [place] Phone: [phone]    HPI  Hypertension- Pt denies chest pain, SOB, dizziness, or heart palpitations.  Taking meds as directed w/o problems.  Denies medication side effects.  Only taking a half a tab of the hydrochlorothiazide currently.  She has had a few times where she is felt lightheaded and dizzy.   Diabetes - no hypoglycemic events. No wounds or sores that are not healing well. No increased thirst or urination. Checking glucose at home. Taking medications as prescribed without any side effects.  Still only using Ozempic 0.25 mg.  She also is worried about her neuropathy getting worse.  She has peripheral neuropathy in her legs which is felt to be secondary to her spine and her diabetes.  But she is also been getting numbness and tingling in her forearms and hands and more recently she has been getting a crawling sensation in her legs and on her face intermittently.  The face has been affected for a couple of months now.    ROS    Objective:     BP 96/83   Pulse 88   Ht '5\' 4"'$  (1.626 m)   Wt 187 lb (84.8 kg)   LMP  (LMP Unknown)   SpO2 98%   BMI 32.10 kg/m    Physical Exam   Results for orders placed or performed in visit on 04/21/22  POCT glycosylated hemoglobin (Hb A1C)  Result Value Ref Range   Hemoglobin A1C 6.5 (A) 4.0 - 5.6 %   HbA1c POC (<> result, manual entry)     HbA1c, POC (prediabetic range)     HbA1c, POC (controlled diabetic range)        The ASCVD Risk score (Arnett DK, et al., 2019) failed to calculate for the following reasons:   The valid total cholesterol range is 130 to 320 mg/dL    Assessment & Plan:   Problem List Items Addressed This Visit       Cardiovascular and Mediastinum   Essential  hypertension    Stop 12.5 mg hydrochlorothiazide.  She was originally started on the medication because of lower extremity swelling but that seems to have been under good control lately.        Endocrine   Type 2 diabetes mellitus with other specified complication (HCC) - Primary    A1c is up a little bit at 6.5 today.  Good to have her increase the Ozempic to 0.5 mg and stop the glipizide completely.  Continue with Jardiance.  Follow-up in 3 months.      Relevant Orders   POCT glycosylated hemoglobin (Hb A1C) (Completed)     Nervous and Auditory   Peripheral neuropathy   Relevant Orders   Ambulatory referral to Neurology     Other   Spinal stenosis, lumbar region with neurogenic claudication    Neuropathy-I am concerned that she has multiple issues going on.  She does have a history of spinal stenosis which I think could be contributing to her leg symptoms but she is also describing numbness in her hands that could be carpal tunnel or coming from the cervical region and now she is describing feeling like a creepy crawly sensation in her face which could be more concerning for  brain pathology.  So at this point I am going to refer her to neurology for further evaluation and workup.  Return in about 3 months (around 07/21/2022) for Diabetes follow-up.    Beatrice Lecher, MD

## 2022-04-21 NOTE — Assessment & Plan Note (Signed)
A1c is up a little bit at 6.5 today.  Good to have her increase the Ozempic to 0.5 mg and stop the glipizide completely.  Continue with Jardiance.  Follow-up in 3 months.

## 2022-04-21 NOTE — Assessment & Plan Note (Addendum)
Stop 12.5 mg hydrochlorothiazide.  She was originally started on the medication because of lower extremity swelling but that seems to have been under good control lately.

## 2022-04-21 NOTE — Progress Notes (Addendum)
Pt would like to know Dr. Gardiner Ramus thoughts about the Omega-XL whether or not this medication/supplement would help her with Neuropathy?  She also wanted to know when she could restart the [place].

## 2022-04-21 NOTE — Telephone Encounter (Signed)
[facility] in [place] Phone: [phone]; called and LVM asking that they fax her vision report

## 2022-04-21 NOTE — Patient Instructions (Addendum)
Stop your hydrochlorothiazide and your glipizide. Lease increase your Ozempic dose to 0.5 mg weekly.

## 2022-04-23 DIAGNOSIS — R293 Abnormal posture: Secondary | ICD-10-CM | POA: Diagnosis not present

## 2022-04-23 DIAGNOSIS — Z4789 Encounter for other orthopedic aftercare: Secondary | ICD-10-CM | POA: Diagnosis not present

## 2022-04-23 DIAGNOSIS — M545 Low back pain, unspecified: Secondary | ICD-10-CM | POA: Diagnosis not present

## 2022-04-23 DIAGNOSIS — M5386 Other specified dorsopathies, lumbar region: Secondary | ICD-10-CM | POA: Diagnosis not present

## 2022-04-23 DIAGNOSIS — M549 Dorsalgia, unspecified: Secondary | ICD-10-CM | POA: Diagnosis not present

## 2022-04-28 ENCOUNTER — Ambulatory Visit: Payer: Self-pay | Admitting: Licensed Clinical Social Worker

## 2022-04-28 NOTE — Patient Outreach (Signed)
**Note Amanda-Identified via Obfuscation**   Care Coordination  Initial Visit Note   04/28/2022 Name: Amanda Morris MRN: [id number] DOB: [birth year]  Amanda Morris is a 72 y.o. year old female who sees Metheney, Rene Kocher, MD for primary care. I spoke with  Amanda Morris by phone today.  What matters to the patients health and wellness today?   Patient reports no concerns or needs from Care Coordination team with health and wellness related to physical or mental heath.  Continues to deal with emotions from death of her son ( declined supportive resources)      Goals Addressed             This Visit's Progress    Care Coordination Activities No Follow up Required       Care Coordination Interventions: Reviewed Care Coordination Services:declined Concerns with obtaining required medications: none Assessed Social Determinants of Health Emotional Support Provided           SDOH assessments and interventions completed:  Yes  SDOH Interventions Today    Flowsheet Row Most Recent Value  SDOH Interventions   Transportation Interventions Intervention Not Indicated  Stress Interventions Provide Counseling  [declined ongoing support]        Care Coordination Interventions:  Yes, provided   Follow up plan: No further intervention required.   Encounter Outcome:  Pt. Visit Completed   Amanda Morris, [place] [phone]

## 2022-04-28 NOTE — Patient Instructions (Signed)
Visit Information  Thank you for taking time to visit with me today. Please don't hesitate to contact me if I can be of assistance to you.   Following are the goals we discussed today:   Goals Addressed             This Visit's Progress    Care Coordination Activities No Follow up Required       Care Coordination Interventions: Reviewed Care Coordination Services:declined Concerns with obtaining required medications: none Assessed Social Determinants of Health Emotional Support Provided           The patient verbalized understanding of instructions, educational materials, and care plan provided today and DECLINED offer to receive copy of patient instructions, educational materials, and care plan.   No further follow up required: [place] provides support specific to your health needs that extend beyond exceptional routine office care you already receive from your primary care doctor.    If you are eligible for standard Care Coordination, there is no cost to you.  The Care Coordination team is made up of the following team members: Registered Nurse Care Guide: disease management, health education, care coordination and complex case management Clinical Social Work: Complex Care Coordination including coordination of level of care needs, mental and behavioral health assessment and recommendations, and connection to long-term mental health support Clinical Pharmacist: medication management, assistance and disease management Community Resource Care Guides: Forensic psychologist Team: dedicated team of scheduling professionals to support patient and clinical team scheduling needs  Please call [phone] if you would like to schedule a phone appointment with one of the team members.     Casimer Lanius, [place] [phone]

## 2022-05-01 ENCOUNTER — Other Ambulatory Visit: Payer: Self-pay | Admitting: Family Medicine

## 2022-05-01 DIAGNOSIS — R600 Localized edema: Secondary | ICD-10-CM

## 2022-05-01 DIAGNOSIS — E1169 Type 2 diabetes mellitus with other specified complication: Secondary | ICD-10-CM

## 2022-05-01 DIAGNOSIS — G6289 Other specified polyneuropathies: Secondary | ICD-10-CM

## 2022-05-06 DIAGNOSIS — M5136 Other intervertebral disc degeneration, lumbar region: Secondary | ICD-10-CM | POA: Diagnosis not present

## 2022-05-06 DIAGNOSIS — Z981 Arthrodesis status: Secondary | ICD-10-CM | POA: Diagnosis not present

## 2022-05-06 DIAGNOSIS — G629 Polyneuropathy, unspecified: Secondary | ICD-10-CM | POA: Diagnosis not present

## 2022-05-12 ENCOUNTER — Telehealth: Payer: Self-pay

## 2022-05-12 NOTE — Telephone Encounter (Signed)
Called pt and advised her that she can take the Honey Robitussin for her runny nose and coughing.   She then told me that she had been noticing some blood when she blows her nose and at times some clots. I advised her that she should be seen and scheduled an appt for her tomorrow.

## 2022-05-12 NOTE — Telephone Encounter (Signed)
Patient wanted to know if she is okay to take the honey robitussin for runny nose and coughing.

## 2022-05-13 ENCOUNTER — Encounter: Payer: Self-pay | Admitting: Family Medicine

## 2022-05-13 ENCOUNTER — Ambulatory Visit (INDEPENDENT_AMBULATORY_CARE_PROVIDER_SITE_OTHER): Payer: 59 | Admitting: Family Medicine

## 2022-05-13 VITALS — BP 113/69 | HR 79 | Wt 195.0 lb

## 2022-05-13 DIAGNOSIS — R040 Epistaxis: Secondary | ICD-10-CM | POA: Diagnosis not present

## 2022-05-13 MED ORDER — MUPIROCIN 2 % EX OINT: TOPICAL_OINTMENT | CUTANEOUS | 0 refills | Status: DC

## 2022-05-13 NOTE — Progress Notes (Signed)
   Acute Office Visit  Subjective:     Patient ID: Amanda Morris, female    DOB: Aug 06, 1950, 72 y.o.   MRN: [id number]  Chief Complaint  Patient presents with   Epistaxis    Pt reports that this has been going on for about 3-4 months only on the L side. She will sometime blow out clots or her nose will start running and will bleed.  Uses Flonase. She denies any sinus pain pressure, facial injury    HPI Patient is in today for acute sinus sxs. Pt reports that this has been going on for about 3-4 months only on the L side. She will sometime blow out clots or her nose will start running and will bleed. Uses Flonase. She denies any sinus pain pressure, facial injury. Says it started to bleed this AM.   ROS      Objective:    BP 113/69   Pulse 79   Wt 195 lb (88.5 kg)   LMP  (LMP Unknown)   SpO2 97%   BMI 33.47 kg/m    Physical Exam Constitutional:      Appearance: Normal appearance.  HENT:     Nose: Nose normal.     Comments: On the medial side of the nose she has a small linear scab.  Neurological:     Mental Status: She is alert.     No results found for any visits on 05/13/22.      Assessment & Plan:   Problem List Items Addressed This Visit   None Visit Diagnoses     Frequent nosebleeds    -  Primary   Relevant Medications   mupirocin ointment (BACTROBAN) 2 %       Nosebleeds from the left nasal cavity.  I was able to find an area that could be partly the culprit.  Dr. Charlies Constable used for cautery.  Patient tolerated well.  We discussed using mupirocin ointment in that nostril twice a day.  Try to humidify the air in her home with either a cool-mist humidifier or putting a pot of water on the boil.  Avoid blowing the nose hard.  Handout provided for how to manage nosebleeds when they occur.  If she is not improving over the next 2 weeks then recommend that she give [place] a call back and we will refer her to ENT for further workup.  Meds ordered this encounter   Medications   mupirocin ointment (BACTROBAN) 2 %    Sig: Apply to inside of each nares twice daily for 10 days    Dispense:  30 g    Refill:  0    No follow-ups on file.  Beatrice Lecher, MD

## 2022-05-18 ENCOUNTER — Other Ambulatory Visit: Payer: Self-pay | Admitting: Family Medicine

## 2022-05-18 DIAGNOSIS — R600 Localized edema: Secondary | ICD-10-CM

## 2022-06-05 ENCOUNTER — Other Ambulatory Visit: Payer: Self-pay | Admitting: Family Medicine

## 2022-06-05 DIAGNOSIS — R600 Localized edema: Secondary | ICD-10-CM

## 2022-06-14 ENCOUNTER — Telehealth: Payer: Self-pay

## 2022-06-14 ENCOUNTER — Other Ambulatory Visit: Payer: Self-pay

## 2022-06-14 MED ORDER — ONDANSETRON HCL 4 MG PO TABS: 4.0000 mg | ORAL_TABLET | Freq: Three times a day (TID) | ORAL | 1 refills | Status: DC | PRN

## 2022-06-14 NOTE — Telephone Encounter (Signed)
Patient called over the weekend regarding a refill for Zofran. Call was taken by on-call service. [facility] today.

## 2022-06-14 NOTE — Telephone Encounter (Signed)
Patient came into office stating that her medication for nausea wasn't sent into her pharmacy, patient states that she threw her bottle away, please advise, thanks.

## 2022-06-16 ENCOUNTER — Other Ambulatory Visit: Payer: Self-pay | Admitting: *Deleted

## 2022-06-16 DIAGNOSIS — K2100 Gastro-esophageal reflux disease with esophagitis, without bleeding: Secondary | ICD-10-CM

## 2022-06-16 MED ORDER — SUCRALFATE 1 G PO TABS: ORAL_TABLET | ORAL | 0 refills | Status: DC

## 2022-06-17 ENCOUNTER — Other Ambulatory Visit: Payer: Self-pay | Admitting: Pharmacist

## 2022-06-17 NOTE — Progress Notes (Signed)
Care Coordination Call   Inquired about status update regarding patient assistance via novonordisk for Ozempic.  Patient is approved as of 06/16/22 after arranging application to fully process over the phone with novonordisk representative. End date for enrollment is 04/26/2023.  Will outreach patient for diabetes and med access.  Larinda Buttery, PharmD Clinical Pharmacist [facility] [phone]

## 2022-06-22 DIAGNOSIS — G629 Polyneuropathy, unspecified: Secondary | ICD-10-CM | POA: Diagnosis not present

## 2022-06-24 ENCOUNTER — Other Ambulatory Visit: Payer: 59 | Admitting: Pharmacist

## 2022-06-24 NOTE — Progress Notes (Signed)
06/24/2022 Name: Amanda Morris MRN: [id number] DOB: April 29, 1950  Chief Complaint  Patient presents with   Diabetes   Medication Assistance    Amanda Morris is a 72 y.o. year old female who presented for a telephone visit.   They were referred to the pharmacist by their PCP for assistance in managing diabetes and medication access.    Subjective:  Care Team: Primary Care Provider: Hali Marry, MD  Medication Access/Adherence  [id number] [id number]: [phone]: [phone]   Diabetes:  Current medications: ozempic 0.'5mg'$  weekly, jardiance '25mg'$  dail  Current glucose readings: not currently checking as she is out of test strips, but already ordered more and just waiting on arrival  Patient denies hypoglycemic s/sx including dizziness, shakiness, sweating. Patient denies hyperglycemic symptoms including polyuria, polydipsia, polyphagia, nocturia, neuropathy, blurred vision.   Current medication access support: ozempic via novonordisk patient assistance - approval  06/16/22, end date 04/26/23   Objective:  Lab Results  Component Value Date   HGBA1C 6.5 (A) 04/21/2022    Lab Results  Component Value Date   CREATININE 0.83 01/20/2022   BUN 5 (L) 01/20/2022   NA 143 01/20/2022   K 3.2 (L) 01/20/2022   CL 101 01/20/2022   CO2 29 01/20/2022    Lab Results  Component Value Date   CHOL 120 07/21/2021   HDL 45 (L) 07/21/2021   LDLCALC 58 07/21/2021   TRIG 91 07/21/2021   CHOLHDL 2.7 07/21/2021    Medications Reviewed Today     Reviewed by KDarius Bump RCambridge(Pharmacist) on 06/24/22 at 1117  Med List Status: <None>   Medication Order Taking? Sig Documenting Provider Last Dose Status Informant  Accu-Chek Softclix Lancets lancets [id number] CHECK BLOOD SUGAR 3 TIMES DAILY. DX: E11.9 MHali Marry MD  Active   acetaminophen (TYLENOL) 650 MG CR tablet [id number] Take 650 mg by mouth every 8 (eight) hours as needed for pain. [provider]  Active   aspirin EC 81 MG tablet [id number] Take 1 tablet (81 mg total) by mouth daily. CTraci Sermon PA-C  Active Multiple Informants           Med Note ([facility] PedSep 1, 2022 10:27 AM)    atorvastatin (LIPITOR) 40 MG tablet [id number] TAKE 1 TABLET BY MOUTH EVERYDAY AT BEDTIME MHali Marry MD  Active   Blood Glucose Monitoring Suppl (ACCU-CHEK AVIVA PLUS) w/Device KIT [id number] Check blood sugars 3 times daily DX:E11.9 MHali Marry MD  Active Multiple Informants  cholecalciferol (VITAMIN D) 1000 units tablet [id number] Take 1,000 Units by mouth daily. [provider]  Active Multiple Informants  dexlansoprazole (DEXILANT) 60 MG capsule [id number] Take 60 mg by mouth daily. [provider]  Active Multiple Informants  dicyclomine (BENTYL) 20 MG tablet [id number] Take 20 mg by mouth every 6 (six) hours as needed for spasms (cramps/stomach pain). [provider]  Active Multiple Informants  doxepin (SINEQUAN) 10 MG capsule [id number] Take 10 mg by mouth at bedtime. [provider]  Active Multiple Informants  famotidine (PEPCID) 20 MG tablet [id number] Take by mouth. [provider]  Active   Ferrous Sulfate ([facility] SLOW RELEASE IRON PO) [id number] Take 1 tablet by mouth daily. [provider]  Active   fluticasone (FLONASE) 50 MCG/ACT nasal spray [id number]  SPRAY 1 SPRAY INTO BOTH NOSTRILS DAILY. Hali Marry, MD  Active   glucose blood (ACCU-CHEK GUIDE) test strip [id number] Yes CHECK BLOOD SUGAR 3 TIMES DAILY. DX: E11.9 Hali Marry, MD Taking Active   JARDIANCE 25 MG TABS tablet [id number] Yes TAKE 1 TABLET BY MOUTH EVERY DAY Hali Marry, MD Taking Active   Krill Oil 500 MG CAPS [id number]  Take 500 mg by mouth daily.  [provider]  Active Multiple Informants           Med Note [facility] Jan 22, 2021  8:48 AM)    levocetirizine (XYZAL) 5 MG tablet [id number]  TAKE 1 TABLET BY MOUTH EVERY DAY IN THE Erskine Emery, MD  Active   Multiple Vitamin (MULTIVITAMIN WITH MINERALS) TABS tablet [id number]  Take 1 tablet by mouth daily. [provider]  Active Multiple Informants           Med Note [facility] Jan 22, 2021  8:48 AM)    mupirocin ointment (BACTROBAN) 2 % [id number]  Apply to inside of each nares twice daily for 10 days Hali Marry, MD  Active   ondansetron [facility]) 4 MG tablet [id number]  Take 1 tablet (4 mg total) by mouth every 8 (eight) hours as needed for nausea or vomiting. Hali Marry, MD  Active   pregabalin (LYRICA) 75 MG capsule [id number]  Take 1 capsule (75 mg total) by mouth 2 (two) times daily. Dx:G62.9 Hali Marry, MD  Active   Semaglutide [facility], 0.25 OR 0.5 MG/DOSE, [place]) [id number] Yes Inject 0.25 mg into the skin every 7 (seven) days. [provider] Taking Active   sucralfate (CARAFATE) 1 g tablet [id number]  TAKE 1 TABLET (1 G TOTAL) BY MOUTH 4 TIMES A DAY WITH MEALS AND AT BEDTIME Hali Marry, MD  Active               Assessment/Plan:   Diabetes: - Currently controlled - Recommend to continue current medications, keep an eye out for a call from our office when medicine shipment arrives for her to pick up ozempic     Follow Up Plan: 1 month  Larinda Buttery, PharmD Clinical Pharmacist [facility] [phone]

## 2022-06-29 ENCOUNTER — Telehealth: Payer: Self-pay

## 2022-06-29 NOTE — Telephone Encounter (Signed)
[employer]ovo [employer]isk PAP shipment for Ozempic 2 mg (2 boxes) received this morning. Please contact the patient to come and pick up their order today. Placed in the fridge with patient identifier. Thanks in advance.   [place]: [id number] LOT: [id number] EXP: 2024-02-24

## 2022-06-30 NOTE — Telephone Encounter (Signed)
Patient advised.

## 2022-07-01 ENCOUNTER — Telehealth: Payer: Self-pay

## 2022-07-01 NOTE — Telephone Encounter (Signed)
Pt came today to pick up her Rx:   Ozempic 0.25, 0.5 2 Pens injection solution LOT# [id number]  Expiration Date 02/24/2024  Strength: '068mg'$ /ml  NDC [id number]

## 2022-07-08 ENCOUNTER — Ambulatory Visit (INDEPENDENT_AMBULATORY_CARE_PROVIDER_SITE_OTHER): Payer: 59 | Admitting: Podiatry

## 2022-07-08 ENCOUNTER — Encounter: Payer: Self-pay | Admitting: Podiatry

## 2022-07-08 DIAGNOSIS — M79674 Pain in right toe(s): Secondary | ICD-10-CM | POA: Diagnosis not present

## 2022-07-08 DIAGNOSIS — M79675 Pain in left toe(s): Secondary | ICD-10-CM

## 2022-07-08 DIAGNOSIS — B351 Tinea unguium: Secondary | ICD-10-CM | POA: Diagnosis not present

## 2022-07-08 DIAGNOSIS — L84 Corns and callosities: Secondary | ICD-10-CM

## 2022-07-08 DIAGNOSIS — E1169 Type 2 diabetes mellitus with other specified complication: Secondary | ICD-10-CM
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
## 2022-07-21 ENCOUNTER — Encounter: Payer: Self-pay | Admitting: Family Medicine

## 2022-07-21 ENCOUNTER — Ambulatory Visit (INDEPENDENT_AMBULATORY_CARE_PROVIDER_SITE_OTHER): Payer: 59 | Admitting: Family Medicine

## 2022-07-21 VITALS — BP 83/65 | HR 72 | Ht 64.0 in | Wt 191.0 lb

## 2022-07-21 DIAGNOSIS — E1169 Type 2 diabetes mellitus with other specified complication: Secondary | ICD-10-CM | POA: Diagnosis not present

## 2022-07-21 DIAGNOSIS — G5602 Carpal tunnel syndrome, left upper limb: Secondary | ICD-10-CM

## 2022-07-21 DIAGNOSIS — Z794 Long term (current) use of insulin: Secondary | ICD-10-CM | POA: Diagnosis not present

## 2022-07-21 DIAGNOSIS — I10 Essential (primary) hypertension: Secondary | ICD-10-CM

## 2022-07-21 LAB — POCT GLYCOSYLATED HEMOGLOBIN (HGB A1C): Hemoglobin A1C: 5.9 % — AB (ref 4.0–5.6)

## 2022-07-21 MED ORDER — OZEMPIC (0.25 OR 0.5 MG/DOSE) 2 MG/1.5ML ~~LOC~~ SOPN: 0.5000 mg | PEN_INJECTOR | SUBCUTANEOUS | 0 refills | Status: DC

## 2022-07-21 NOTE — Assessment & Plan Note (Addendum)
A1c looks absolutely phenomenal today at 5.9.  She actually had never actually started the Jardiance she was still taking an old prescription of Januvia.  I am good to have her stop the Januvia and not start the Jardiance since her A1c looks great.  Also with her blood pressure being a little on the low side I do not want to add anything that causes diuresis.

## 2022-07-21 NOTE — Assessment & Plan Note (Signed)
Sugar is actually low today though she is not taking any medications that should lower her blood pressure.  She says she drinks tons of water so I am not sure why it is low today but we will recheck it again before she goes.

## 2022-07-21 NOTE — Progress Notes (Signed)
Established Patient Office Visit  Subjective   Patient ID: Amanda Morris, female    DOB: 1950/11/13  Age: 72 y.o. MRN: [id number]  Chief Complaint  Patient presents with   Diabetes    HPI  Diabetes - no hypoglycemic events. No wounds or sores that are not healing well. No increased thirst or urination. Checking glucose at home. Taking medications as prescribed without any side effects.off glipizide and Lantus.  Currently on Ozempic.  She is down 4 pounds since she was last here.  Hypertension- Pt denies chest pain, SOB, dizziness, or heart palpitations.  Taking meds as directed w/o problems.  Denies medication side effects.    She also requested refills on her famotidine, doxepin and perphenazine.  These are all written by Dr. Howell Rucks. She was seen by neurology on February 27 at [facility] neurology and sleep.  And had some nerve conduction testing.  She was told that she has carpal tunnel in her left wrist but says she was not offered any treatment.  According to the note the EMG study revealed severe left carpal tunnel syndrome no other underlies generalized peripheral neuropathy or focal neuropathy or left lumbosacral radiculopathy.    ROS    Objective:     BP (!) 83/65   Pulse 72   Ht 5\' 4"  (1.626 m)   Wt 191 lb (86.6 kg)   LMP  (LMP Unknown)   SpO2 100%   BMI 32.79 kg/m    Physical Exam Vitals and nursing note reviewed.  Constitutional:      Appearance: She is well-developed.  HENT:     Head: Normocephalic and atraumatic.  Cardiovascular:     Rate and Rhythm: Normal rate and regular rhythm.     Heart sounds: Normal heart sounds.  Pulmonary:     Effort: Pulmonary effort is normal.     Breath sounds: Normal breath sounds.  Skin:    General: Skin is warm and dry.  Neurological:     Mental Status: She is alert and oriented to person, place, and time.  Psychiatric:        Behavior: Behavior normal.      Results for orders placed or performed in  visit on 07/21/22  POCT glycosylated hemoglobin (Hb A1C)  Result Value Ref Range   Hemoglobin A1C 5.9 (A) 4.0 - 5.6 %   HbA1c POC (<> result, manual entry)     HbA1c, POC (prediabetic range)     HbA1c, POC (controlled diabetic range)        The ASCVD Risk score (Arnett DK, et al., 2019) failed to calculate for the following reasons:   The valid systolic blood pressure range is 90 to 200 mmHg   The valid total cholesterol range is 130 to 320 mg/dL    Assessment & Plan:   Problem List Items Addressed This Visit       Cardiovascular and Mediastinum   Essential hypertension    Sugar is actually low today though she is not taking any medications that should lower her blood pressure.  She says she drinks tons of water so I am not sure why it is low today but we will recheck it again before she goes.        Endocrine   Type 2 diabetes mellitus with other specified complication (HCC) - Primary    A1c looks absolutely phenomenal today at 5.9.  She actually had never actually started the Jardiance she was still taking an old prescription of Januvia.  I am good to have her stop the Januvia and not start the Jardiance since her A1c looks great.  Also with her blood pressure being a little on the low side I do not want to add anything that causes diuresis.      Relevant Medications   Semaglutide,0.25 or 0.5MG /DOS, (OZEMPIC, 0.25 OR 0.5 MG/DOSE,) 2 MG/1.5ML SOPN   Other Relevant Orders   POCT glycosylated hemoglobin (Hb A1C) (Completed)   COMPLETE METABOLIC PANEL WITH GFR   Urine Microalbumin w/creat. ratio   Other Visit Diagnoses     Left carpal tunnel syndrome          Bilateral carpal tunnel-we did go ahead and place cock up splints for both wrists for her to wear at night.  If not improving over the next 6 weeks then we will get in with Dr. Dianah Field for possible injections.  EMG noted moderate to severe left carpal tunnel on the left wrist.   They did not note any damage or  injury to the nerves in your legs.  So nothing specific that is causing the neuropathic pain.  It does not mean that they do not hurt but they did not see any damage or injury coming from your spine to your legs.  And the nerves are otherwise working well.  Could consider increasing your Lyrica if you would like to try that to see if that helps with the pain.  Return in about 3 months (around 11/02/2022) for Diabetes follow-up.    Beatrice Lecher, MD

## 2022-07-21 NOTE — Patient Instructions (Addendum)
Do not take Januvia or Jardiance.  I just want you on the once a week shot for your blood sugars. To hydrate well. We will try wearing the splint at night while you sleep for the next 4 to 6 weeks.  If that is not helpful then we will get you in with Dr. Dianah Field for possible injection.  They did not note any damage or injury to the nerves in your legs.  So nothing specific that is causing the neuropathic pain.  It does not mean that they do not hurt but they did not see any damage or injury coming from your spine to your legs.  And the nerves are otherwise working well.  Could consider increasing your Lyrica if you would like to try that to see if that helps with the pain.

## 2022-07-22 LAB — COMPLETE METABOLIC PANEL WITH GFR
AG Ratio: 1.2 (calc) (ref 1.0–2.5)
ALT: 25 U/L (ref 6–29)
AST: 26 U/L (ref 10–35)
Albumin: 3.9 g/dL (ref 3.6–5.1)
Alkaline phosphatase (APISO): 93 U/L (ref 37–153)
BUN/Creatinine Ratio: 8 (calc) (ref 6–22)
BUN: 6 mg/dL — ABNORMAL LOW (ref 7–25)
CO2: 28 mmol/L (ref 20–32)
Calcium: 9 mg/dL (ref 8.6–10.4)
Chloride: 108 mmol/L (ref 98–110)
Creat: 0.72 mg/dL (ref 0.50–1.05)
Globulin: 3.2 g/dL (calc) (ref 1.9–3.7)
Glucose, Bld: 81 mg/dL (ref 65–99)
Potassium: 4.1 mmol/L (ref 3.5–5.3)
Sodium: 145 mmol/L (ref 135–146)
Total Bilirubin: 0.4 mg/dL (ref 0.2–1.2)
Total Protein: 7.1 g/dL (ref 6.1–8.1)
eGFR: 90 mL/min/{1.73_m2} (ref >=60)

## 2022-07-22 LAB — MICROALBUMIN / CREATININE URINE RATIO
Creatinine, Urine: 101 mg/dL (ref 20–275)
Microalb Creat Ratio: 2 mg/g creat (ref <30)
Microalb, Ur: 0.2 mg/dL

## 2022-07-22 NOTE — Progress Notes (Signed)
Hi Amanda Morris, your potassium and liver function are back to normal which is great.  No excess protein in the urine.

## 2022-07-23 ENCOUNTER — Other Ambulatory Visit: Payer: 59 | Admitting: Pharmacist

## 2022-07-23 NOTE — Progress Notes (Signed)
07/23/2022 Name: Amanda Morris MRN: [id number] DOB: 09-26-1950  No chief complaint on file.   Amanda Morris is a 72 y.o. year old female who presented for a telephone visit.   They were referred to the pharmacist by their PCP for assistance in managing diabetes and medication access.    Subjective:  Care Team: Primary Care Provider: Hali Marry, MD  Medication Access/Adherence  Current Pharmacy:  [id number] - [address], [place] [address] Phone: [phone] Fax: [phone]   Diabetes:  Current medications: ozempic 0.5mg  weekly  Current glucose readings: 90s in morning fasting, 140s postprandial.   Patient denies hypoglycemic s/sx including dizziness, shakiness, sweating. Patient denies hyperglycemic symptoms including polyuria, polydipsia, polyphagia, nocturia, neuropathy, blurred vision.   Current medication access support: ozempic via [employer] patient assistance - approval  06/16/22, end date 04/26/23   Objective:  Lab Results  Component Value Date   HGBA1C 5.9 (A) 07/21/2022    Lab Results  Component Value Date   CREATININE 0.72 07/21/2022   BUN 6 (L) 07/21/2022   NA 145 07/21/2022   K 4.1 07/21/2022   CL 108 07/21/2022   CO2 28 07/21/2022    Lab Results  Component Value Date   CHOL 120 07/21/2021   HDL 45 (L) 07/21/2021   LDLCALC 58 07/21/2021   TRIG 91 07/21/2021   CHOLHDL 2.7 07/21/2021    Medications Reviewed Today     Reviewed by Darius Bump, RPH (Pharmacist) on 07/23/22 at 1013  Med List Status: <None>   Medication Order Taking? Sig Documenting Provider Last Dose Status Informant  Accu-Chek Softclix Lancets lancets [id number] Yes CHECK BLOOD SUGAR 3 TIMES DAILY. DX: E11.9 Hali Marry, MD Taking Active   acetaminophen (TYLENOL) 650 MG CR tablet [id number] Yes Take 650 mg by mouth every 8 (eight) hours as  needed for pain. [provider] Taking Active   aspirin EC 81 MG tablet [id number] Yes Take 1 tablet (81 mg total) by mouth daily. Traci Sermon, PA-C Taking Active Multiple Informants           Med Note [facility] Dec 25, 2020 10:27 AM)    atorvastatin (LIPITOR) 40 MG tablet [id number] Yes TAKE 1 TABLET BY MOUTH EVERYDAY AT BEDTIME Hali Marry, MD Taking Active   Blood Glucose Monitoring Suppl (ACCU-CHEK AVIVA PLUS) w/Device KIT [id number] Yes Check blood sugars 3 times daily DX:E11.9 Hali Marry, MD Taking Active Multiple Informants  cholecalciferol (VITAMIN D) 1000 units tablet [id number] Yes Take 1,000 Units by mouth daily. [provider] Taking Active Multiple Informants  dexlansoprazole (DEXILANT) 60 MG capsule [id number] Yes Take 60 mg by mouth daily. [provider] Taking Active Multiple Informants  dicyclomine (BENTYL) 20 MG tablet [id number] Yes Take 20 mg by mouth every 6 (six) hours as needed for spasms (cramps/stomach pain). [provider] Taking Active Multiple Informants  doxepin (SINEQUAN) 10 MG capsule [id number] Yes Take 10 mg by mouth at bedtime. [provider] Taking Active Multiple Informants  famotidine (PEPCID) 20 MG tablet [id number] Yes Take by mouth. [provider] Taking Active   Ferrous Sulfate ([facility] SLOW RELEASE IRON PO) [id number] Yes Take 1 tablet by mouth daily. [provider] Taking Active   fluticasone (FLONASE) 50 MCG/ACT nasal spray [id number] Yes SPRAY 1 SPRAY INTO BOTH NOSTRILS DAILY. Hali Marry, MD Taking Active  glucose blood (ACCU-CHEK GUIDE) test strip [id number] Yes CHECK BLOOD SUGAR 3 TIMES DAILY. DX: E11.9 Hali Marry, MD Taking Active   Krill Oil 500 MG CAPS [id number] Yes Take 500 mg by mouth daily. [provider] Taking Active Multiple Informants           Med Note [facility] Ped Jan 22, 2021  8:48 AM)     levocetirizine (XYZAL) 5 MG tablet [id number] Yes TAKE 1 TABLET BY MOUTH EVERY DAY IN THE Erskine Emery, MD Taking Active   Multiple Vitamin (MULTIVITAMIN WITH MINERALS) TABS tablet [id number] Yes Take 1 tablet by mouth daily. [provider] Taking Active Multiple Informants           Med Note [facility] Ped Jan 22, 2021  8:48 AM)    pregabalin (LYRICA) 75 MG capsule [id number] Yes Take 1 capsule (75 mg total) by mouth 2 (two) times daily. Dx:G62.9 Hali Marry, MD Taking Active   Semaglutide,0.25 or 0.5MG /DOS, (OZEMPIC, 0.25 OR 0.5 MG/DOSE,) 2 MG/1.5ML SOPN [id number] Yes Inject 0.5 mg into the skin every 7 (seven) days. Hali Marry, MD Taking Active   sucralfate (CARAFATE) 1 g tablet [id number] No TAKE 1 TABLET (1 G TOTAL) BY MOUTH 4 TIMES A DAY WITH MEALS AND AT BEDTIME Hali Marry, MD Unknown Active               Assessment/Plan:   Diabetes: - Currently controlled - Recommend to continue current medication - Will facilitate with RX med assistance team to submit dose change request for increase ozempic 0.25mg  to 0.5mg  dosing, so we ensure patient is shipped adequate supply from company. - Encouraged patient to contact back of her insurance card to ask about OTC benefits and ask if BP cuff is covered   Follow Up Plan: 1-2 month  Larinda Buttery, PharmD Clinical Pharmacist [facility] [phone]

## 2022-07-23 NOTE — Patient Instructions (Addendum)
Silas Flood speaking with you today. Keep up the great work with checking blood sugar and taking medications.  As discussed, call the number on the back of your insurance card to ask about OTC benefits and see if getting a blood pressure cuff is covered by your insurance.  Take care, Luana Shu, PharmD Clinical Pharmacist [facility] [phone]

## 2022-07-29 DIAGNOSIS — M19019 Primary osteoarthritis, unspecified shoulder: Secondary | ICD-10-CM | POA: Diagnosis not present

## 2022-07-29 DIAGNOSIS — G5603 Carpal tunnel syndrome, bilateral upper limbs: Secondary | ICD-10-CM | POA: Diagnosis not present

## 2022-07-29 DIAGNOSIS — G5623 Lesion of ulnar nerve, bilateral upper limbs: Secondary | ICD-10-CM | POA: Diagnosis not present

## 2022-07-31 ENCOUNTER — Other Ambulatory Visit: Payer: Self-pay | Admitting: Family Medicine

## 2022-07-31 DIAGNOSIS — K2100 Gastro-esophageal reflux disease with esophagitis, without bleeding: Secondary | ICD-10-CM

## 2022-07-31 DIAGNOSIS — R600 Localized edema: Secondary | ICD-10-CM

## 2022-08-07 ENCOUNTER — Other Ambulatory Visit: Payer: Self-pay | Admitting: Family Medicine

## 2022-08-07 DIAGNOSIS — R600 Localized edema: Secondary | ICD-10-CM

## 2022-08-11 ENCOUNTER — Telehealth: Payer: Self-pay

## 2022-08-11 NOTE — Telephone Encounter (Signed)
Submitted CHANGE IN DOSE application for OZEMPIC to [employer] for patient assistance.   Phone: [phone]  [facility] [phone]) [phone]    PLEASE BE ADVISED

## 2022-08-16 ENCOUNTER — Other Ambulatory Visit (HOSPITAL_COMMUNITY): Payer: Self-pay

## 2022-08-16 ENCOUNTER — Ambulatory Visit: Payer: 59 | Admitting: Physician Assistant

## 2022-08-17 ENCOUNTER — Encounter: Payer: Self-pay | Admitting: Family Medicine

## 2022-08-17 ENCOUNTER — Ambulatory Visit (INDEPENDENT_AMBULATORY_CARE_PROVIDER_SITE_OTHER): Payer: 59 | Admitting: Family Medicine

## 2022-08-17 ENCOUNTER — Other Ambulatory Visit: Payer: Self-pay | Admitting: Family Medicine

## 2022-08-17 VITALS — BP 110/76 | HR 93 | Temp 97.6°F | Ht 64.0 in | Wt 186.1 lb

## 2022-08-17 DIAGNOSIS — R109 Unspecified abdominal pain: Secondary | ICD-10-CM

## 2022-08-17 DIAGNOSIS — R102 Pelvic and perineal pain: Secondary | ICD-10-CM

## 2022-08-17 DIAGNOSIS — N3000 Acute cystitis without hematuria: Secondary | ICD-10-CM

## 2022-08-17 DIAGNOSIS — E119 Type 2 diabetes mellitus without complications: Secondary | ICD-10-CM

## 2022-08-17 LAB — POCT URINALYSIS DIP (CLINITEK)
Bilirubin, UA: NEGATIVE
Blood, UA: NEGATIVE
Glucose, UA: NEGATIVE mg/dL
Ketones, POC UA: NEGATIVE mg/dL
Nitrite, UA: NEGATIVE
POC PROTEIN,UA: NEGATIVE
Spec Grav, UA: 1.02 (ref 1.010–1.025)
Urobilinogen, UA: 0.2 E.U./dL
pH, UA: 6 (ref 5.0–8.0)

## 2022-08-17 MED ORDER — NITROFURANTOIN MONOHYD MACRO 100 MG PO CAPS: 100.0000 mg | ORAL_CAPSULE | Freq: Two times a day (BID) | ORAL | 0 refills | Status: AC

## 2022-08-17 NOTE — Progress Notes (Signed)
Established patient visit   Patient: Amanda Morris   DOB: [birth year]   72 y.o. Female  MRN: [id number] Visit Date: 08/17/2022  Today's healthcare provider: Charlton Amor, DO   Chief Complaint  Patient presents with   Flank Pain    Right side; Worsened Thursday. She denies blood in urine.    Back Pain    Lower   Abdominal Pain    Lower    SUBJECTIVE    Chief Complaint  Patient presents with   Flank Pain    Right side; Worsened Thursday. She denies blood in urine.    Back Pain    Lower   Abdominal Pain    Lower   HPI  Pt presents with right side pain and pelvic pain and back pain. Denies hematuria.  And says back pain is located near where her stimulator is.  She says this pain has been going on for a few weeks now on and off.  She says she forgot to mention it to her PCP.  Review of Systems  Constitutional:  Negative for activity change, fatigue and fever.  Respiratory:  Negative for cough and shortness of breath.   Cardiovascular:  Negative for chest pain.  Gastrointestinal:  Negative for abdominal pain.  Genitourinary:  Negative for difficulty urinating.  Musculoskeletal:  Positive for back pain.       Right groin pain       Current Meds  Medication Sig   Accu-Chek Softclix Lancets lancets CHECK BLOOD SUGAR 3 TIMES DAILY. DX: E11.9   aspirin EC 81 MG tablet Take 1 tablet (81 mg total) by mouth daily.   atorvastatin (LIPITOR) 40 MG tablet TAKE 1 TABLET BY MOUTH EVERYDAY AT BEDTIME   Blood Glucose Monitoring Suppl (ACCU-CHEK AVIVA PLUS) w/Device KIT Check blood sugars 3 times daily DX:E11.9   cholecalciferol (VITAMIN D) 1000 units tablet Take 1,000 Units by mouth daily.   dexlansoprazole (DEXILANT) 60 MG capsule Take 60 mg by mouth daily.   dicyclomine (BENTYL) 20 MG tablet Take 20 mg by mouth every 6 (six) hours as needed for spasms (cramps/stomach pain).   doxepin (SINEQUAN) 10 MG capsule Take 10 mg by mouth at bedtime.   famotidine (PEPCID) 20 MG  tablet Take by mouth.   Ferrous Sulfate ([facility]S SLOW RELEASE IRON PO) Take 1 tablet by mouth daily.   fluticasone (FLONASE) 50 MCG/ACT nasal spray SPRAY 1 SPRAY INTO BOTH NOSTRILS DAILY.   glucose blood (ACCU-CHEK GUIDE) test strip CHECK BLOOD SUGAR 3 TIMES DAILY. DX: E11.9   Krill Oil 500 MG CAPS Take 500 mg by mouth daily.   levocetirizine (XYZAL) 5 MG tablet TAKE 1 TABLET BY MOUTH EVERY DAY IN THE EVENING   Multiple Vitamin (MULTIVITAMIN WITH MINERALS) TABS tablet Take 1 tablet by mouth daily.   nitrofurantoin, macrocrystal-monohydrate, (MACROBID) 100 MG capsule Take 1 capsule (100 mg total) by mouth 2 (two) times daily for 5 days.   pregabalin (LYRICA) 75 MG capsule Take 1 capsule (75 mg total) by mouth 2 (two) times daily. Dx:G62.9   Semaglutide,0.25 or 0.5MG /DOS, (OZEMPIC, 0.25 OR 0.5 MG/DOSE,) 2 MG/1.5ML SOPN Inject 0.5 mg into the skin every 7 (seven) days.   sucralfate (CARAFATE) 1 g tablet TAKE 1 TABLET (1 G TOTAL) BY MOUTH 4 TIMES A DAY WITH MEALS AND AT BEDTIME    OBJECTIVE    BP 110/76   Pulse 93   Temp 97.6 F (36.4 C) (Oral)   Ht  (1.626 m)  Wt 186 lb 1.9 oz (84.4 kg)   LMP  (LMP Unknown)   SpO2 96%   BMI 31.95 kg/m   Physical Exam Vitals and nursing note reviewed.  Constitutional:      General: She is not in acute distress.    Appearance: Normal appearance.  HENT:     Head: Normocephalic and atraumatic.     Right Ear: External ear normal.     Left Ear: External ear normal.     Nose: Nose normal.  Eyes:     Conjunctiva/sclera: Conjunctivae normal.  Cardiovascular:     Rate and Rhythm: Normal rate and regular rhythm.  Pulmonary:     Effort: Pulmonary effort is normal.     Breath sounds: Normal breath sounds.  Abdominal:     Tenderness: There is right CVA tenderness. There is no left CVA tenderness.     Comments: Tenderness to palpation of right pelvic area  Neurological:     General: No focal deficit present.     Mental Status: She is alert and  oriented to person, place, and time.  Psychiatric:        Mood and Affect: Mood normal.        Behavior: Behavior normal.        Thought Content: Thought content normal.        Judgment: Judgment normal.       ASSESSMENT & PLAN    Problem List Items Addressed This Visit       Genitourinary   Acute cystitis without hematuria    Point-of-care UA done in clinic and interpreted by myself as positive for small leukocytes.  Will go ahead and send in Macrobid.  Patient has penicillin allergy so I really do not want to use Keflex.  Will go ahead and culture urine.  Follow-up in 1 week.      Relevant Orders   Urine Culture     Other   Pelvic pain    Patient has tenderness to palpation of right pelvis area.  Concern for possible UTI.  And having patient give a urine sample in clinic today.      Relevant Orders   POCT URINALYSIS DIP (CLINITEK) (Completed)   Right flank pain - Primary    Patient has right flank pain and positive CVA tenderness.  This is likely related to UTI.  If this continues after completion of medication we can consider KUB for kidney stone.         Relevant Orders   POCT URINALYSIS DIP (CLINITEK) (Completed)    Return in about 1 week (around 08/24/2022).      Meds ordered this encounter  Medications   nitrofurantoin, macrocrystal-monohydrate, (MACROBID) 100 MG capsule    Sig: Take 1 capsule (100 mg total) by mouth 2 (two) times daily for 5 days.    Dispense:  10 capsule    Refill:  0    Orders Placed This Encounter  Procedures   Urine Culture   POCT URINALYSIS DIP (CLINITEK)     Charlton Amor, DO  [facility] at [facility] [phone]) [phone] (fax)  [facility]

## 2022-08-17 NOTE — Assessment & Plan Note (Signed)
Patient has tenderness to palpation of right pelvis area.  Concern for possible UTI.  And having patient give a urine sample in clinic today.

## 2022-08-17 NOTE — Assessment & Plan Note (Signed)
Point-of-care UA done in clinic and interpreted by myself as positive for small leukocytes.  Will go ahead and send in Macrobid.  Patient has penicillin allergy so I really do not want to use Keflex.  Will go ahead and culture urine.  Follow-up in 1 week.

## 2022-08-17 NOTE — Assessment & Plan Note (Addendum)
Patient has right flank pain and positive CVA tenderness.  This is likely related to UTI.  If this continues after completion of medication we can consider KUB for kidney stone.

## 2022-08-18 LAB — URINE CULTURE
MICRO NUMBER:: 14862133
SPECIMEN QUALITY:: ADEQUATE

## 2022-08-20 NOTE — Telephone Encounter (Signed)
Received notification from [employer] regarding approval for OZEMPIC 2 mg . Patient assistance approved from 08/17/2022 to 04/26/2023. Letter of approval is scanned in media of chart  Phone: [phone]  Melanee Spry CPhT Rx [facility] [phone]

## 2022-08-24 ENCOUNTER — Ambulatory Visit (INDEPENDENT_AMBULATORY_CARE_PROVIDER_SITE_OTHER): Payer: 59 | Admitting: Family Medicine

## 2022-08-24 ENCOUNTER — Encounter: Payer: Self-pay | Admitting: Family Medicine

## 2022-08-24 VITALS — BP 117/67 | HR 69 | Ht 64.0 in | Wt 188.0 lb

## 2022-08-24 DIAGNOSIS — R109 Unspecified abdominal pain: Secondary | ICD-10-CM

## 2022-08-24 DIAGNOSIS — R102 Pelvic and perineal pain: Secondary | ICD-10-CM

## 2022-08-24 DIAGNOSIS — R200 Anesthesia of skin: Secondary | ICD-10-CM

## 2022-08-24 LAB — POCT URINALYSIS DIP (CLINITEK)
Bilirubin, UA: NEGATIVE
Blood, UA: NEGATIVE
Glucose, UA: NEGATIVE mg/dL
Ketones, POC UA: NEGATIVE mg/dL
Leukocytes, UA: NEGATIVE
Nitrite, UA: NEGATIVE
POC PROTEIN,UA: NEGATIVE
Spec Grav, UA: 1.015 (ref 1.010–1.025)
Urobilinogen, UA: 1 E.U./dL
pH, UA: 6.5 (ref 5.0–8.0)

## 2022-08-24 NOTE — Patient Instructions (Signed)
Please call back if not feeling better by the end week

## 2022-08-24 NOTE — Progress Notes (Signed)
Pt is here for 1 week f/u for R sided pelvic pain and questionable UTI. She informed me that she when she went to the pharmacy to pick up the prescription they did not have it in stock so she didn't start taking the medication until Saturday.   She did have some pain earlier this morning that lasted a few minutes and rated it 8/10 and stated that it was dull/achy. She denies any f/s/c/n/v/d.

## 2022-08-24 NOTE — Addendum Note (Signed)
Addended by: Nani Gasser D on: 08/24/2022 11:05 AM   Modules accepted: Orders

## 2022-08-24 NOTE — Progress Notes (Addendum)
Established Patient Office Visit  Subjective   Patient ID: Amanda Morris, female    DOB: 10-20-1950  Age: 72 y.o. MRN: [id number]  Chief Complaint  Patient presents with   Follow-up    1 week f/u pelvic pain     HPI She was seen about a week ago by one of my partners for right flank pain and pelvic pain.  She was treated for UTI.  Was unable to get the antibiotic until Saturday.  Her urine culture came back over the weekend as well and it was actually negative for any sign of UTI.  She still having a little discomfort in that right flank and in the lower abdomen but it is not as bad as it was.  So she is feeling a little better.  She does have a prior history of kidney stones.  No recent hematuria.  No fevers chills or nausea.  Reviewed notes and labs from visit with Dr. Tamera Punt.  Also complains of numbness in her forearms and both hands.  She did check into a local provider who had a TV commercial.  They wanted her to pay $6000 upfront for evaluation and testing so she did not go through with it.   ROS    Objective:     BP 117/67   Pulse 69   Ht 5\' 4"  (1.626 m)   Wt 188 lb (85.3 kg)   LMP  (LMP Unknown)   SpO2 100%   BMI 32.27 kg/m    Physical Exam Vitals and nursing note reviewed.  Constitutional:      Appearance: She is well-developed.  HENT:     Head: Normocephalic and atraumatic.  Cardiovascular:     Rate and Rhythm: Normal rate and regular rhythm.     Heart sounds: Normal heart sounds.  Pulmonary:     Effort: Pulmonary effort is normal.     Breath sounds: Normal breath sounds.  Musculoskeletal:     Comments: No CVA tenderness.    Skin:    General: Skin is warm and dry.  Neurological:     Mental Status: She is alert and oriented to person, place, and time.  Psychiatric:        Behavior: Behavior normal.      Results for orders placed or performed in visit on 08/24/22  POCT URINALYSIS DIP (CLINITEK)  Result Value Ref Range   Color, UA yellow  yellow   Clarity, UA clear clear   Glucose, UA negative negative mg/dL   Bilirubin, UA negative negative   Ketones, POC UA negative negative mg/dL   Spec Grav, UA 7.829 5.621 - 1.025   Blood, UA negative negative   pH, UA 6.5 5.0 - 8.0   POC PROTEIN,UA negative negative, trace   Urobilinogen, UA 1.0 0.2 or 1.0 E.U./dL   Nitrite, UA Negative Negative   Leukocytes, UA Negative Negative      The ASCVD Risk score (Arnett DK, et al., 2019) failed to calculate for the following reasons:   The valid total cholesterol range is 130 to 320 mg/dL    Assessment & Plan:   Problem List Items Addressed This Visit       Other   Right flank pain - Primary   Relevant Orders   POCT URINALYSIS DIP (CLINITEK) (Completed)   CT RENAL STONE STUDY   Pelvic pain   Relevant Orders   CT RENAL STONE STUDY   Other Visit Diagnoses     Abdominal pain, unspecified abdominal location  Relevant Orders   CT RENAL STONE STUDY   Hand numbness       Relevant Orders   Ambulatory referral to Neurology      Her discomfort has improved after about 3 days on the antibiotic.  Even though the urine culture was negative we discussed a couple of options we could move forward with additional CT imaging to evaluate for kidney stones.  We could continue with the antibiotics since she does seem to feel better and continue to push fluids and hydrate well and see if she continues to improve over the next 3 days.  If she is not improving suddenly gets worse or is just not better then we will move forward with CT to evaluate for kidney stones.  Continue to push lots of water and fluids.  urinalysis today is normal.  Which is reassuring.  No follow-ups on file.    Nani Gasser, MD

## 2022-08-30 ENCOUNTER — Telehealth: Payer: Self-pay | Admitting: Family Medicine

## 2022-08-30 NOTE — Telephone Encounter (Signed)
Patient requesting a letter to excuse her from [employer] on 09-28-22.

## 2022-08-31 ENCOUNTER — Ambulatory Visit (INDEPENDENT_AMBULATORY_CARE_PROVIDER_SITE_OTHER): Payer: 59

## 2022-08-31 DIAGNOSIS — R109 Unspecified abdominal pain: Secondary | ICD-10-CM

## 2022-08-31 DIAGNOSIS — R102 Pelvic and perineal pain: Secondary | ICD-10-CM | POA: Diagnosis not present

## 2022-08-31 DIAGNOSIS — N200 Calculus of kidney: Secondary | ICD-10-CM | POA: Diagnosis not present

## 2022-08-31 DIAGNOSIS — K449 Diaphragmatic hernia without obstruction or gangrene: Secondary | ICD-10-CM | POA: Diagnosis not present

## 2022-08-31 DIAGNOSIS — K5730 Diverticulosis of large intestine without perforation or abscess without bleeding: Secondary | ICD-10-CM | POA: Diagnosis not present

## 2022-08-31 NOTE — Telephone Encounter (Signed)
Please call pt and inform her that her letter has been written and she can come by and pick this up at the front desk.

## 2022-09-03 ENCOUNTER — Telehealth: Payer: Self-pay | Admitting: Family Medicine

## 2022-09-03 NOTE — Telephone Encounter (Signed)
Pt called. She needs another letter to excuse her from [employer].

## 2022-09-03 NOTE — Telephone Encounter (Signed)
This letter was written and pt was advise about this last week.   Letter reprinted and placed up front for pick up

## 2022-09-03 NOTE — Telephone Encounter (Signed)
OK for letter

## 2022-09-04 ENCOUNTER — Other Ambulatory Visit: Payer: Self-pay | Admitting: Family Medicine

## 2022-09-04 DIAGNOSIS — R600 Localized edema: Secondary | ICD-10-CM

## 2022-09-06 NOTE — Progress Notes (Signed)
HI Amanda Morris, your CT shows a kidney stone on the right side but is not causing any obstruction or pain.  So I do not think this is contributing to that right-sided pain that you are having.  I hope you are actually starting to feel better.  It could also be radiating from your back.  You do have a small hiatal hernia and you have a small umbilical hernia at your bellybutton.  Did see a little bit of plaque formation in the aorta.  They also saw 2 lung nodules 1 was 3 mm and 1 was 4 mm so very small.  They recommended that we follow it up with a chest CT.  Let me know if you are okay with me getting the chest CT for the pulmonary nodules.

## 2022-09-13 ENCOUNTER — Encounter: Payer: Self-pay | Admitting: Family Medicine

## 2022-09-13 ENCOUNTER — Ambulatory Visit (INDEPENDENT_AMBULATORY_CARE_PROVIDER_SITE_OTHER): Payer: 59 | Admitting: Family Medicine

## 2022-09-13 VITALS — BP 113/74 | HR 88 | Temp 98.6°F | Ht 64.0 in | Wt 185.0 lb

## 2022-09-13 DIAGNOSIS — J329 Chronic sinusitis, unspecified: Secondary | ICD-10-CM

## 2022-09-13 DIAGNOSIS — M791 Myalgia, unspecified site: Secondary | ICD-10-CM

## 2022-09-13 DIAGNOSIS — M79601 Pain in right arm: Secondary | ICD-10-CM | POA: Diagnosis not present

## 2022-09-13 DIAGNOSIS — M542 Cervicalgia: Secondary | ICD-10-CM

## 2022-09-13 DIAGNOSIS — J40 Bronchitis, not specified as acute or chronic: Secondary | ICD-10-CM | POA: Diagnosis not present

## 2022-09-13 DIAGNOSIS — M79602 Pain in left arm: Secondary | ICD-10-CM | POA: Diagnosis not present

## 2022-09-13 LAB — CBC WITH DIFFERENTIAL/PLATELET
Absolute Monocytes: 1091 cells/uL — ABNORMAL HIGH (ref 200–950)
Basophils Relative: 0.7 %
Eosinophils Absolute: 111 cells/uL (ref 15–500)
HCT: 42.7 % (ref 35.0–45.0)
MPV: 12.2 fL (ref 7.5–12.5)
Monocytes Relative: 10.8 %
Neutrophils Relative %: 49.9 %

## 2022-09-13 LAB — POCT INFLUENZA A/B
Influenza A, POC: NEGATIVE
Influenza B, POC: NEGATIVE

## 2022-09-13 LAB — POC COVID19 BINAXNOW: SARS Coronavirus 2 Ag: NEGATIVE

## 2022-09-13 LAB — SEDIMENTATION RATE: Sed Rate: 34 mm/h — ABNORMAL HIGH (ref 0–30)

## 2022-09-13 MED ORDER — AZITHROMYCIN 250 MG PO TABS
ORAL_TABLET | ORAL | 0 refills | Status: AC
Start: 2022-09-13 — End: 2022-09-18

## 2022-09-13 NOTE — Progress Notes (Signed)
Established Patient Office Visit  Subjective   Patient ID: Amanda Morris, female    DOB: 12-23-1950  Age: 72 y.o. MRN: [id number]  Chief Complaint  Patient presents with   Neck Pain   Abdominal Pain    HPI ST and chills x 6 days with  + cough, sinus congestion, with brown green phlegm.  No fever.  + sick contracts.  Dayquail and theraflu.  She is also had a lot of pain radiating from her neck down into her arm she says they just feel weak and tired all the way down both arms.  She feels like it is going down into her upper back as well.    ROS    Objective:     BP 113/74   Pulse 88   Temp 98.6 F (37 C)   Ht 5\' 4"  (1.626 m)   Wt 185 lb (83.9 kg)   LMP  (LMP Unknown)   SpO2 95%   BMI 31.76 kg/m     Physical Exam Constitutional:      Appearance: She is well-developed.  HENT:     Head: Normocephalic and atraumatic.     Right Ear: Ear canal and external ear normal.     Left Ear: Ear canal and external ear normal.     Ears:     Comments: Fair amount of cerumen in both ears only partial visualization of the TMs.    Nose: Nose normal.     Mouth/Throat:     Pharynx: Oropharynx is clear.  Eyes:     Conjunctiva/sclera: Conjunctivae normal.     Pupils: Pupils are equal, round, and reactive to light.  Neck:     Thyroid: No thyromegaly.  Cardiovascular:     Rate and Rhythm: Normal rate and regular rhythm.     Heart sounds: Normal heart sounds.  Pulmonary:     Effort: Pulmonary effort is normal.     Breath sounds: Normal breath sounds. No wheezing.  Musculoskeletal:     Cervical back: Neck supple.  Lymphadenopathy:     Cervical: No cervical adenopathy.  Skin:    General: Skin is warm and dry.  Neurological:     Mental Status: She is alert and oriented to person, place, and time.      Results for orders placed or performed in visit on 09/13/22  POC COVID-19  Result Value Ref Range   SARS Coronavirus 2 Ag Negative Negative  POCT Influenza A/B   Result Value Ref Range   Influenza A, POC Negative Negative   Influenza B, POC Negative Negative      The ASCVD Risk score (Arnett DK, et al., 2019) failed to calculate for the following reasons:   The valid total cholesterol range is 130 to 320 mg/dL    Assessment & Plan:   Problem List Items Addressed This Visit   None Visit Diagnoses     Pain in both upper extremities    -  Primary   Relevant Orders   CK (Creatine Kinase)   Sedimentation rate   C-reactive protein   CBC with Differential/Platelet   Myalgia       Relevant Orders   CK (Creatine Kinase)   Sedimentation rate   C-reactive protein   CBC with Differential/Platelet   Neck pain       Relevant Orders   CK (Creatine Kinase)   Sedimentation rate   C-reactive protein   CBC with Differential/Platelet   Sinobronchitis  Relevant Medications   azithromycin (ZITHROMAX) 250 MG tablet   Other Relevant Orders   CK (Creatine Kinase)   Sedimentation rate   C-reactive protein   CBC with Differential/Platelet   POC COVID-19 (Completed)   POCT Influenza A/B (Completed)      We discussed testing her for COVID and flu and if negative then we will treat for sinobronchitis.  If not improving or develops new or worsening symptoms then consider chest x-ray.  She was around a family member recently who was diagnosed with pneumonia.  Will go ahead and treat with azithromycin and since she is getting brown-colored sputum even though she is only been sick for a week at this point.  For the myalgias of upper arms and neck.  -  that she is getting in her truncal area and upper girdle recommend check CK and sed rate.  Will check CBC with differential as well.  No follow-ups on file.    Nani Gasser, MD

## 2022-09-14 LAB — CBC WITH DIFFERENTIAL/PLATELET
Basophils Absolute: 71 cells/uL (ref 0–200)
Eosinophils Relative: 1.1 %
Hemoglobin: 13.5 g/dL (ref 11.7–15.5)
Lymphs Abs: 3788 cells/uL (ref 850–3900)
MCH: 26 pg — ABNORMAL LOW (ref 27.0–33.0)
MCHC: 31.6 g/dL — ABNORMAL LOW (ref 32.0–36.0)
MCV: 82.1 fL (ref 80.0–100.0)
Neutro Abs: 5040 cells/uL (ref 1500–7800)
Platelets: 304 10*3/uL (ref 140–400)
RBC: 5.2 10*6/uL — ABNORMAL HIGH (ref 3.80–5.10)
RDW: 13.5 % (ref 11.0–15.0)
Total Lymphocyte: 37.5 %
WBC: 10.1 10*3/uL (ref 3.8–10.8)

## 2022-09-14 LAB — CK: Total CK: 104 U/L (ref 29–143)

## 2022-09-14 LAB — C-REACTIVE PROTEIN: CRP: 35.4 mg/L — ABNORMAL HIGH (ref <8.0)

## 2022-09-14 NOTE — Progress Notes (Signed)
Amanda Morris, I did send over an antibiotic yesterday so hopefully you are able to pick that up and hopefully start feeling better pretty quickly this week.  The labs look okay if they do not indicate polymyalgia rheumatica at this point.  But if everything else feels better but you continue to have the weakness and pain down both arms please let me know.

## 2022-09-15 ENCOUNTER — Other Ambulatory Visit: Payer: 59 | Admitting: Pharmacist

## 2022-09-15 NOTE — Patient Instructions (Signed)
Ciri,  As discussed, your blood sugars look great, and I am glad all is going well on your medications. No changes at this time.  Take care, Elmarie Shiley, PharmD, BCPS Clinical Pharmacist [facility]

## 2022-09-15 NOTE — Progress Notes (Signed)
09/15/2022 Name: Amanda Morris MRN: [id number] DOB: 1951-02-17  Chief Complaint  Patient presents with   Diabetes   Medication Assistance   Amanda Morris is a 72 y.o. year old female who presented for a telephone visit.   They were referred to the pharmacist by their PCP for assistance in managing diabetes and medication access.   Subjective:  Care Team: Primary Care Provider: Agapito Games, MD  Medication Access/Adherence  Current Pharmacy:  [facility] [phone] - [place], [place] - [address] [place] [address] Phone: [phone] Fax: [phone]   Diabetes:  Current medications: ozempic 0.5mg  weekly, increased from 0.25mg  dosing and going well.  Current glucose readings: 95-135  Patient denies hypoglycemic s/sx including dizziness, shakiness, sweating. Patient denies hyperglycemic symptoms including polyuria, polydipsia, polyphagia, nocturia, neuropathy, blurred vision.   Current medication access support: ozempic via novonordisk patient assistance - approval  06/16/22, end date 04/26/23   Objective:  Lab Results  Component Value Date   HGBA1C 5.9 (A) 07/21/2022    Lab Results  Component Value Date   CREATININE 0.72 07/21/2022   BUN 6 (L) 07/21/2022   NA 145 07/21/2022   K 4.1 07/21/2022   CL 108 07/21/2022   CO2 28 07/21/2022    Lab Results  Component Value Date   CHOL 120 07/21/2021   HDL 45 (L) 07/21/2021   LDLCALC 58 07/21/2021   TRIG 91 07/21/2021   CHOLHDL 2.7 07/21/2021    Medications Reviewed Today     Reviewed by Gabriel Carina, RPH (Pharmacist) on 09/15/22 at 1700  Med List Status: <None>   Medication Order Taking? Sig Documenting Provider Last Dose Status Informant  Accu-Chek Softclix Lancets lancets [id number]  CHECK BLOOD SUGAR 3 TIMES DAILY. DX: E11.9 Agapito Games, MD  Active   aspirin EC 81 MG tablet [id number]  Take 1 tablet (81 mg  total) by mouth daily. Alyson Ingles, PA-C  Active Multiple Informants           Med Note Carmin Muskrat Dec 25, 2020 10:27 AM)    atorvastatin (LIPITOR) 40 MG tablet [id number]  TAKE 1 TABLET BY MOUTH EVERYDAY AT BEDTIME Agapito Games, MD  Active   azithromycin (ZITHROMAX) 250 MG tablet [id number]  2 Ttabs PO on Day 1, then one a day x 4 days. Agapito Games, MD  Active   Blood Glucose Monitoring Suppl (ACCU-CHEK AVIVA PLUS) w/Device KIT [id number]  Check blood sugars 3 times daily DX:E11.9 Agapito Games, MD  Active Multiple Informants  cholecalciferol (VITAMIN D) 1000 units tablet [id number]  Take 1,000 Units by mouth daily. [provider]  Active Multiple Informants  dexlansoprazole (DEXILANT) 60 MG capsule [id number]  Take 60 mg by mouth daily. [provider]  Active Multiple Informants  dicyclomine (BENTYL) 20 MG tablet [id number]  Take 20 mg by mouth every 6 (six) hours as needed for spasms (cramps/stomach pain). [provider]  Active Multiple Informants  doxepin (SINEQUAN) 10 MG capsule [id number]  Take 10 mg by mouth at bedtime. [provider]  Active Multiple Informants  famotidine (PEPCID) 20 MG tablet [id number]  Take by mouth. [provider]  Active   Ferrous Sulfate ([facility] SLOW RELEASE IRON PO) [id number]  Take 1 tablet by mouth daily. [provider]  Active   fluticasone (FLONASE) 50 MCG/ACT nasal spray [id number]  SPRAY 1 SPRAY INTO BOTH NOSTRILS DAILY.  Agapito Games, MD  Active   glucose blood (ACCU-CHEK GUIDE) test strip [id number]  CHECK BLOOD SUGAR 3 TIMES DAILY. DX: 11.9 Agapito Games, MD  Active   hydrochlorothiazide (HYDRODIURIL) 25 MG tablet [id number]  TAKE 1 TABLET (25 MG TOTAL) BY MOUTH DAILY AS NEEDED. FOR SWELLING Agapito Games, MD  Active   Krill Oil 500 MG CAPS [id number]  Take 500 mg by mouth daily. [provider]  Active Multiple Informants            Med Note Carmin Muskrat Jan 22, 2021  8:48 AM)    levocetirizine (XYZAL) 5 MG tablet [id number]  TAKE 1 TABLET BY MOUTH EVERY DAY IN THE Garlon Hatchet, MD  Active   Multiple Vitamin (MULTIVITAMIN WITH MINERALS) TABS tablet [id number]  Take 1 tablet by mouth daily. [provider]  Active Multiple Informants           Med Note Laureen Ochs, Christin Fudge Jan 22, 2021  8:48 AM)    pregabalin (LYRICA) 75 MG capsule [id number]  Take 1 capsule (75 mg total) by mouth 2 (two) times daily. Dx:G62.9 Agapito Games, MD  Active   Semaglutide,0.25 or 0.5MG /DOS, (OZEMPIC, 0.25 OR 0.5 MG/DOSE,) 2 MG/1.5ML SOPN [id number] Yes Inject 0.5 mg into the skin every 7 (seven) days. Agapito Games, MD Taking Active   sucralfate (CARAFATE) 1 g tablet [id number]  TAKE 1 TABLET (1 G TOTAL) BY MOUTH 4 TIMES A DAY WITH MEALS AND AT BEDTIME Agapito Games, MD  Active               Assessment/Plan:   Diabetes: - Currently controlled - Recommend to continue current medication    Follow Up Plan: fall 2024 for renewal of 2025 patient assistance via novonordisk.  Lynnda Shields, PharmD Clinical Pharmacist [facility] [phone]

## 2022-10-02 ENCOUNTER — Other Ambulatory Visit: Payer: Self-pay | Admitting: Family Medicine

## 2022-10-02 DIAGNOSIS — K2100 Gastro-esophageal reflux disease with esophagitis, without bleeding: Secondary | ICD-10-CM

## 2022-10-05 ENCOUNTER — Telehealth: Payer: Self-pay

## 2022-10-05 NOTE — Telephone Encounter (Signed)
Forwarding to [facility] & CMA as an Financial planner.  Novo Nordisk PAP shipment for Ozempic 0.25/0.5 MG dose (5 boxes) received this morning. Contacted the patient to come and pick up their order today. Placed in the fridge with patient identifier. Thanks in advance.   NDC: 4098-1191-47 LOT: [id number] EXP: 2024-02-24

## 2022-10-08 ENCOUNTER — Ambulatory Visit: Payer: 59 | Admitting: Podiatry

## 2022-10-08 ENCOUNTER — Ambulatory Visit (INDEPENDENT_AMBULATORY_CARE_PROVIDER_SITE_OTHER): Payer: 59 | Admitting: Podiatry

## 2022-10-08 ENCOUNTER — Encounter: Payer: Self-pay | Admitting: Podiatry

## 2022-10-08 DIAGNOSIS — M79675 Pain in left toe(s): Secondary | ICD-10-CM | POA: Diagnosis not present

## 2022-10-08 DIAGNOSIS — E1169 Type 2 diabetes mellitus with other specified complication: Secondary | ICD-10-CM | POA: Diagnosis not present

## 2022-10-08 DIAGNOSIS — L84 Corns and callosities: Secondary | ICD-10-CM | POA: Diagnosis not present

## 2022-10-08 DIAGNOSIS — M79674 Pain in right toe(s): Secondary | ICD-10-CM | POA: Diagnosis not present

## 2022-10-08 DIAGNOSIS — B351 Tinea unguium: Secondary | ICD-10-CM | POA: Diagnosis not present
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
## 2022-10-08 NOTE — Telephone Encounter (Signed)
Patient came into office to get Ozempic today, logged in book upfront, thanks.

## 2022-10-13 DIAGNOSIS — G603 Idiopathic progressive neuropathy: Secondary | ICD-10-CM | POA: Diagnosis not present

## 2022-10-19 DIAGNOSIS — G603 Idiopathic progressive neuropathy: Secondary | ICD-10-CM | POA: Diagnosis not present

## 2022-10-19 DIAGNOSIS — R413 Other amnesia: Secondary | ICD-10-CM | POA: Diagnosis not present

## 2022-10-19 DIAGNOSIS — R202 Paresthesia of skin: Secondary | ICD-10-CM | POA: Diagnosis not present

## 2022-10-22 DIAGNOSIS — K589 Irritable bowel syndrome without diarrhea: Secondary | ICD-10-CM | POA: Diagnosis not present

## 2022-10-22 DIAGNOSIS — D509 Iron deficiency anemia, unspecified: Secondary | ICD-10-CM | POA: Diagnosis not present

## 2022-10-22 DIAGNOSIS — R1013 Epigastric pain: Secondary | ICD-10-CM | POA: Diagnosis not present

## 2022-10-22 DIAGNOSIS — Z86010 Personal history of colonic polyps: Secondary | ICD-10-CM | POA: Diagnosis not present

## 2022-10-22 DIAGNOSIS — R194 Change in bowel habit: Secondary | ICD-10-CM | POA: Diagnosis not present

## 2022-10-22 DIAGNOSIS — R1084 Generalized abdominal pain: Secondary | ICD-10-CM | POA: Diagnosis not present

## 2022-10-22 DIAGNOSIS — K219 Gastro-esophageal reflux disease without esophagitis: Secondary | ICD-10-CM | POA: Diagnosis not present

## 2022-10-22 DIAGNOSIS — R110 Nausea: Secondary | ICD-10-CM | POA: Diagnosis not present

## 2022-11-02 ENCOUNTER — Ambulatory Visit (INDEPENDENT_AMBULATORY_CARE_PROVIDER_SITE_OTHER): Payer: 59 | Admitting: Family Medicine

## 2022-11-02 ENCOUNTER — Encounter: Payer: Self-pay | Admitting: Family Medicine

## 2022-11-02 VITALS — BP 112/70 | HR 69 | Ht 64.0 in | Wt 179.0 lb

## 2022-11-02 DIAGNOSIS — K2100 Gastro-esophageal reflux disease with esophagitis, without bleeding: Secondary | ICD-10-CM

## 2022-11-02 DIAGNOSIS — I10 Essential (primary) hypertension: Secondary | ICD-10-CM

## 2022-11-02 DIAGNOSIS — M791 Myalgia, unspecified site: Secondary | ICD-10-CM

## 2022-11-02 DIAGNOSIS — Z794 Long term (current) use of insulin: Secondary | ICD-10-CM

## 2022-11-02 DIAGNOSIS — I700 Atherosclerosis of aorta: Secondary | ICD-10-CM | POA: Insufficient documentation

## 2022-11-02 DIAGNOSIS — E1169 Type 2 diabetes mellitus with other specified complication: Secondary | ICD-10-CM

## 2022-11-02 DIAGNOSIS — G6289 Other specified polyneuropathies: Secondary | ICD-10-CM

## 2022-11-02 LAB — CBC WITH DIFFERENTIAL/PLATELET
Absolute Monocytes: 820 cells/uL (ref 200–950)
Basophils Relative: 0.8 %
Eosinophils Absolute: 110 cells/uL (ref 15–500)
Eosinophils Relative: 1.1 %
Hemoglobin: 13.5 g/dL (ref 11.7–15.5)
MCHC: 31.1 g/dL — ABNORMAL LOW (ref 32.0–36.0)
MPV: 13.3 fL — ABNORMAL HIGH (ref 7.5–12.5)
Monocytes Relative: 8.2 %
Neutro Abs: 5230 cells/uL (ref 1500–7800)
Platelets: 212 10*3/uL (ref 140–400)
RBC: 5.2 10*6/uL — ABNORMAL HIGH (ref 3.80–5.10)
RDW: 14.5 % (ref 11.0–15.0)
Total Lymphocyte: 37.6 %
WBC: 10 10*3/uL (ref 3.8–10.8)

## 2022-11-02 LAB — POCT GLYCOSYLATED HEMOGLOBIN (HGB A1C): Hemoglobin A1C: 5.9 % — AB (ref 4.0–5.6)

## 2022-11-02 LAB — SEDIMENTATION RATE

## 2022-11-02 MED ORDER — SUCRALFATE 1 G PO TABS
ORAL_TABLET | ORAL | 0 refills | Status: DC
Start: 2022-11-02 — End: 2022-12-07

## 2022-11-02 MED ORDER — PREGABALIN 75 MG PO CAPS: 75.0000 mg | ORAL_CAPSULE | Freq: Two times a day (BID) | ORAL | 5 refills | Status: DC

## 2022-11-02 NOTE — Assessment & Plan Note (Addendum)
Well controlled. Continue current regimen. Follow up in  4 mo we did discuss making sure getting enough protein and especially since having some occasional lows.

## 2022-11-02 NOTE — Assessment & Plan Note (Signed)
On Lipitor for management of plaque formation.

## 2022-11-02 NOTE — Assessment & Plan Note (Signed)
Well controlled. Continue current regimen. Follow up in  6 mo  

## 2022-11-02 NOTE — Progress Notes (Signed)
Established Patient Office Visit  Subjective   Patient ID: Amanda Morris, female    DOB: 18-Sep-1950  Age: 72 y.o. MRN: [id number]  Chief Complaint  Patient presents with   Diabetes    HPI  Hypertension- Pt denies chest pain, SOB, dizziness, or heart palpitations.  Taking meds as directed w/o problems.  Denies medication side effects.  Pressure was actually a little low back in March.  Diabetes - no hypoglycemic events. No wounds or sores that are not healing well. No increased thirst or urination. Checking glucose at home. Taking medications as prescribed without any side effects.  Currently on semaglutide 0.5 mg.  Currently on Ozempic 0.5 mg and doing well.  She does have an occasional low blood sugar.  She also reports that for 2 months that she has had a lot of just soreness achiness in her upper shoulders and going into her neck.  It just feels weak at times.  She is also noticed occasionally it is harder to get up from her chair in fact sometimes she will just fall back into the chair because she just feels the most weak.  She says it is really hard to lift her arms above 90 degrees.  She had mentioned it when she came in for sinus infection last month but we thought at the time it was just related to a lot of the sinuses and pressure etc.  But it has still continued even though her sinuses have improved.    ROS    Objective:     BP 112/70   Pulse 69   Ht 5\' 4"  (1.626 m)   Wt 179 lb (81.2 kg)   LMP  (LMP Unknown)   SpO2 97%   BMI 30.73 kg/m    Physical Exam Vitals and nursing note reviewed.  Constitutional:      Appearance: She is well-developed.  HENT:     Head: Normocephalic and atraumatic.  Cardiovascular:     Rate and Rhythm: Normal rate and regular rhythm.     Heart sounds: Normal heart sounds.  Pulmonary:     Effort: Pulmonary effort is normal.     Breath sounds: Normal breath sounds.  Skin:    General: Skin is warm and dry.  Neurological:      Mental Status: She is alert and oriented to person, place, and time.  Psychiatric:        Behavior: Behavior normal.      Results for orders placed or performed in visit on 11/02/22  POCT glycosylated hemoglobin (Hb A1C)  Result Value Ref Range   Hemoglobin A1C 5.9 (A) 4.0 - 5.6 %   HbA1c POC (<> result, manual entry)     HbA1c, POC (prediabetic range)     HbA1c, POC (controlled diabetic range)        The ASCVD Risk score (Arnett DK, et al., 2019) failed to calculate for the following reasons:   The valid total cholesterol range is 130 to 320 mg/dL    Assessment & Plan:   Problem List Items Addressed This Visit       Cardiovascular and Mediastinum   Essential hypertension    Well controlled. Continue current regimen. Follow up in  6 mo       Atherosclerosis of aorta (HCC)    On Lipitor for management of plaque formation.        Digestive   GERD (gastroesophageal reflux disease)   Relevant Medications   sucralfate (CARAFATE) 1 g tablet  Endocrine   Type 2 diabetes mellitus with other specified complication (HCC) - Primary    Well controlled. Continue current regimen. Follow up in  4 mo we did discuss making sure getting enough protein and especially since having some occasional lows.      Relevant Orders   POCT glycosylated hemoglobin (Hb A1C) (Completed)     Nervous and Auditory   Peripheral neuropathy   Relevant Medications   perphenazine (TRILAFON) 8 MG tablet   pregabalin (LYRICA) 75 MG capsule   Other Visit Diagnoses     Myalgia       Relevant Orders   Sedimentation rate   C-reactive protein   CBC with Differential/Platelet   TSH      Myalgias particularly of the shoulder and hip girdle area-she is also tender over the muscles as well.  Will evaluate for possible Pilo 8 myalgia rheumatica.  She says she is also mentioned it to the neurologist and they had actually ordered a medication for her it sounds like it is waiting for some type of prior  authorization though.  I spent 25 minutes on the day of the encounter to include pre-visit record review, face-to-face time with the patient and post visit ordering of test.   Return in about 4 months (around 03/05/2023) for Diabetes follow-up.    Nani Gasser, MD

## 2022-11-03 LAB — CBC WITH DIFFERENTIAL/PLATELET
Basophils Absolute: 80 cells/uL (ref 0–200)
HCT: 43.4 % (ref 35.0–45.0)
Lymphs Abs: 3760 cells/uL (ref 850–3900)
MCH: 26 pg — ABNORMAL LOW (ref 27.0–33.0)
MCV: 83.5 fL (ref 80.0–100.0)
Neutrophils Relative %: 52.3 %

## 2022-11-03 LAB — C-REACTIVE PROTEIN: CRP: <3 mg/L (ref <8.0)

## 2022-11-03 LAB — TSH: TSH: 1.92 mIU/L (ref 0.40–4.50)

## 2022-11-03 MED ORDER — PREDNISONE 20 MG PO TABS: 40.0000 mg | ORAL_TABLET | Freq: Every day | ORAL | 0 refills | Status: DC

## 2022-11-03 NOTE — Addendum Note (Signed)
Addended by: Nani Gasser D on: 11/03/2022 07:42 AM   Modules accepted: Orders

## 2022-11-03 NOTE — Progress Notes (Signed)
Hi Amanda Morris, hemoglobin looks great at 13-1/2.  Your thyroid looks great at 1.9.  Her CRP which is your inflammatory marker is actually back down to normal which is great I think it was up a little bit last month when you were sick.  Like to put you on prednisone for about 5 days for your neck and shoulders just to see if it is helpful.  I will send over prescription to your pharmacy.

## 2022-11-24 ENCOUNTER — Other Ambulatory Visit: Payer: Self-pay | Admitting: Family Medicine

## 2022-12-04 ENCOUNTER — Other Ambulatory Visit: Payer: Self-pay | Admitting: Family Medicine

## 2022-12-04 DIAGNOSIS — K2100 Gastro-esophageal reflux disease with esophagitis, without bleeding: Secondary | ICD-10-CM

## 2022-12-09 DIAGNOSIS — S20211A Contusion of right front wall of thorax, initial encounter: Secondary | ICD-10-CM | POA: Diagnosis not present

## 2022-12-09 DIAGNOSIS — M542 Cervicalgia: Secondary | ICD-10-CM | POA: Diagnosis not present

## 2022-12-09 DIAGNOSIS — R079 Chest pain, unspecified: Secondary | ICD-10-CM | POA: Diagnosis not present

## 2022-12-09 DIAGNOSIS — R109 Unspecified abdominal pain: Secondary | ICD-10-CM | POA: Diagnosis not present

## 2022-12-09 DIAGNOSIS — I6381 Other cerebral infarction due to occlusion or stenosis of small artery: Secondary | ICD-10-CM | POA: Diagnosis not present

## 2022-12-09 DIAGNOSIS — Z041 Encounter for examination and observation following transport accident: Secondary | ICD-10-CM | POA: Diagnosis not present

## 2022-12-09 DIAGNOSIS — M545 Low back pain, unspecified: Secondary | ICD-10-CM | POA: Diagnosis not present

## 2022-12-09 DIAGNOSIS — R0789 Other chest pain: Secondary | ICD-10-CM | POA: Diagnosis not present

## 2022-12-09 DIAGNOSIS — Z87891 Personal history of nicotine dependence: Secondary | ICD-10-CM | POA: Diagnosis not present

## 2022-12-10 ENCOUNTER — Telehealth: Payer: Self-pay | Admitting: Family Medicine
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
## 2022-12-10 NOTE — Telephone Encounter (Signed)
Need to get her in for a follow-up appointment with either me or Dr. Karie Schwalbe?  Or are they getting her in with an Ortho or from the emergency room for follow-up?

## 2022-12-14 NOTE — Telephone Encounter (Signed)
Patient is scheduled for an evaluation with Dr. Linford Arnold on 12/15/22 at 1030 am. Patient has confirmed to keep her appointment and will bring all her documentation from [facility] emergency care.

## 2022-12-15 ENCOUNTER — Telehealth: Payer: Self-pay | Admitting: Family Medicine

## 2022-12-15 ENCOUNTER — Encounter: Payer: Self-pay | Admitting: Family Medicine

## 2022-12-15 ENCOUNTER — Ambulatory Visit: Payer: 59 | Admitting: Family Medicine

## 2022-12-15 VITALS — BP 120/84 | HR 102 | Ht 64.0 in | Wt 170.0 lb

## 2022-12-15 DIAGNOSIS — S2001XA Contusion of right breast, initial encounter: Secondary | ICD-10-CM

## 2022-12-15 DIAGNOSIS — R079 Chest pain, unspecified: Secondary | ICD-10-CM | POA: Diagnosis not present

## 2022-12-15 DIAGNOSIS — R110 Nausea: Secondary | ICD-10-CM

## 2022-12-15 DIAGNOSIS — M545 Low back pain, unspecified: Secondary | ICD-10-CM

## 2022-12-15 DIAGNOSIS — H9313 Tinnitus, bilateral: Secondary | ICD-10-CM

## 2022-12-15 DIAGNOSIS — M898X1 Other specified disorders of bone, shoulder: Secondary | ICD-10-CM | POA: Diagnosis not present

## 2022-12-15 MED ORDER — METHOCARBAMOL 500 MG PO TABS
500.0000 mg | ORAL_TABLET | Freq: Every evening | ORAL | 1 refills | Status: DC | PRN
Start: 2022-12-15 — End: 2023-03-07

## 2022-12-15 MED ORDER — PROMETHAZINE HCL 25 MG/ML IJ SOLN
25.0000 mg | Freq: Once | INTRAMUSCULAR | Status: AC
Start: 2022-12-15 — End: 2022-12-15
  Administered 2022-12-15: 25 mg via INTRAMUSCULAR

## 2022-12-15 MED ORDER — KETOROLAC TROMETHAMINE 60 MG/2ML IM SOLN
60.0000 mg | Freq: Once | INTRAMUSCULAR | Status: AC
Start: 2022-12-15 — End: 2022-12-15
  Administered 2022-12-15: 60 mg via INTRAMUSCULAR

## 2022-12-15 MED ORDER — ONDANSETRON HCL 4 MG PO TABS: 4.0000 mg | ORAL_TABLET | Freq: Three times a day (TID) | ORAL | 1 refills | Status: DC | PRN

## 2022-12-15 MED ORDER — NAPROXEN 500 MG PO TABS: 500.0000 mg | ORAL_TABLET | Freq: Two times a day (BID) | ORAL | 0 refills | Status: DC | PRN

## 2022-12-15 NOTE — Progress Notes (Signed)
Pt reports that she has pain in her R clavicle area, R breast bruising, low back pain. She stated that her pain is 10/10 and describes it as a throbbing pain.
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
## 2022-12-15 NOTE — Telephone Encounter (Signed)
Patient called in stating that she was asked during her appointment if she had bruising on her stomach but she said no. When she got home she saw that she did have bruising all over. Please advise

## 2022-12-15 NOTE — Addendum Note (Signed)
Addended by: Deno Etienne on: 12/15/2022 01:24 PM   Modules accepted: Orders
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
## 2022-12-20 DIAGNOSIS — R1012 Left upper quadrant pain: Secondary | ICD-10-CM | POA: Diagnosis not present

## 2022-12-20 DIAGNOSIS — Z79899 Other long term (current) drug therapy: Secondary | ICD-10-CM | POA: Diagnosis not present

## 2022-12-20 DIAGNOSIS — E785 Hyperlipidemia, unspecified: Secondary | ICD-10-CM | POA: Diagnosis not present

## 2022-12-20 DIAGNOSIS — S2020XA Contusion of thorax, unspecified, initial encounter: Secondary | ICD-10-CM | POA: Diagnosis not present

## 2022-12-20 DIAGNOSIS — K5730 Diverticulosis of large intestine without perforation or abscess without bleeding: Secondary | ICD-10-CM | POA: Diagnosis not present

## 2022-12-20 DIAGNOSIS — R110 Nausea: Secondary | ICD-10-CM | POA: Diagnosis not present

## 2022-12-20 DIAGNOSIS — I700 Atherosclerosis of aorta: Secondary | ICD-10-CM | POA: Diagnosis not present

## 2022-12-20 DIAGNOSIS — S2001XA Contusion of right breast, initial encounter: Secondary | ICD-10-CM | POA: Diagnosis not present

## 2022-12-20 DIAGNOSIS — Z87891 Personal history of nicotine dependence: Secondary | ICD-10-CM | POA: Diagnosis not present

## 2022-12-20 DIAGNOSIS — R109 Unspecified abdominal pain: Secondary | ICD-10-CM | POA: Diagnosis not present

## 2022-12-20 DIAGNOSIS — R531 Weakness: Secondary | ICD-10-CM | POA: Diagnosis not present

## 2022-12-20 DIAGNOSIS — R9431 Abnormal electrocardiogram [ECG] [EKG]: Secondary | ICD-10-CM | POA: Diagnosis not present

## 2022-12-20 DIAGNOSIS — K219 Gastro-esophageal reflux disease without esophagitis: Secondary | ICD-10-CM | POA: Diagnosis not present

## 2022-12-20 DIAGNOSIS — Z7984 Long term (current) use of oral hypoglycemic drugs: Secondary | ICD-10-CM | POA: Diagnosis not present

## 2022-12-20 DIAGNOSIS — Z7985 Long-term (current) use of injectable non-insulin antidiabetic drugs: Secondary | ICD-10-CM | POA: Diagnosis not present

## 2022-12-20 DIAGNOSIS — E119 Type 2 diabetes mellitus without complications: Secondary | ICD-10-CM | POA: Diagnosis not present

## 2022-12-23 ENCOUNTER — Encounter: Payer: Self-pay | Admitting: Family Medicine

## 2022-12-23 ENCOUNTER — Ambulatory Visit (INDEPENDENT_AMBULATORY_CARE_PROVIDER_SITE_OTHER): Payer: 59 | Admitting: Family Medicine

## 2022-12-23 VITALS — BP 115/78 | HR 78 | Ht 64.0 in | Wt 174.0 lb

## 2022-12-23 DIAGNOSIS — R109 Unspecified abdominal pain: Secondary | ICD-10-CM | POA: Diagnosis not present

## 2022-12-23 DIAGNOSIS — M25611 Stiffness of right shoulder, not elsewhere classified: Secondary | ICD-10-CM

## 2022-12-23 DIAGNOSIS — R195 Other fecal abnormalities: Secondary | ICD-10-CM | POA: Diagnosis not present

## 2022-12-23 DIAGNOSIS — S2001XA Contusion of right breast, initial encounter: Secondary | ICD-10-CM | POA: Diagnosis not present

## 2022-12-23 MED ORDER — MORPHINE SULFATE 15 MG PO TABS: 15.0000 mg | ORAL_TABLET | Freq: Four times a day (QID) | ORAL | 0 refills | Status: DC | PRN

## 2022-12-23 NOTE — Progress Notes (Signed)
Established Patient Office Visit  Subjective   Patient ID: Amanda Morris, female    DOB: [birth year]  Age: 72 y.o. MRN: [id number]   Chief Complaint  Patient presents with   Follow-up    Pain in breast area     HPI She is here to follow-up for right sided breast and chest pain as well as epigastric pain.  She also had noticed some bruising on her hips that she had noticed previously.Pain is 10/10. Went back to the ED because of the abdominal pain.  They actually did a repeat CT abdomen and pelvis just to make sure that there was nothing missed as she has noted that her stools have been green lately.Hgb was normat at the ED.   Still notes that she cannot fully extend that right shoulder because it causes a lot of pain in the anterior shoulder going down into her right chest wall.  Is like the muscle relaxer somewhat helpful she still has some at home.    ROS    Objective:     BP 115/78   Pulse 78   Ht 5\' 4"  (1.626 m)   Wt 174 lb (78.9 kg)   LMP  (LMP Unknown)   SpO2 98%   BMI 29.87 kg/m    Physical Exam Vitals reviewed.  Constitutional:      Appearance: Normal appearance.  HENT:     Head: Normocephalic.  Pulmonary:     Effort: Pulmonary effort is normal.  Musculoskeletal:     Comments: Has a large bruise on her right breast area.  Tenderness in the epigastric area.  Neurological:     Mental Status: She is alert and oriented to person, place, and time.  Psychiatric:        Mood and Affect: Mood normal.        Behavior: Behavior normal.      No results found for any visits on 12/23/22.    The ASCVD Risk score (Arnett DK, et al., 2019) failed to calculate for the following reasons:   The valid total cholesterol range is 130 to 320 mg/dL    Assessment & Plan:   Problem List Items Addressed This Visit   None Visit Diagnoses     Abdominal pain, unspecified abdominal location    -  Primary   Contusion of right breast, initial encounter        Green stool       Decreased range of motion of right shoulder          We discussed options at this point she has had a repeat CT which is reassuring for any major internal organ damage.  Certainly she is still having significant pain.  Unfortunately she cannot take hydrocodone or oxycodone or tramadol.  She thinks she has had morphine before and did okay with that so we will write for [facility] to use every 6 hours as needed but encouraged her to use it sparingly.  Can also continue to rely on Tylenol.  The physical therapy department did give her a call but she was in so much pain she did not feel like she could start PT.  I am hopeful that after the weekend she will get some pain reduction and may be could start PT next week or the following week she still having some significant limitation in her right shoulder.  In stools-unclear etiology.  I did give her stool cards to take home.  She does still have a gallbladder  and again that did not look like there was any type of gallbladder injury noted on the CT scan.  If not improving then plan to get her back in with her GI doc.  No follow-ups on file.   I spent 22 minutes on the day of the encounter to include pre-visit record review, face-to-face time with the patient and post visit ordering of test.   Nani Gasser, MD

## 2022-12-24 ENCOUNTER — Telehealth: Payer: Self-pay | Admitting: Family Medicine

## 2022-12-24 DIAGNOSIS — R911 Solitary pulmonary nodule: Secondary | ICD-10-CM | POA: Insufficient documentation

## 2022-12-24 NOTE — Telephone Encounter (Signed)
Patient informed regarding CT-   She states she has not yet picked up pain medication- she went to pharmacy but did not have her license so she could not pick it up. She plans to pick it up today.

## 2022-12-24 NOTE — Telephone Encounter (Signed)
Call patient and let her know that I did receive notification from [facility].  It was about the CT that she had done on August 15 in the ED.  She may remember that they noted a small lung nodules really tiny it was about 6 mm.  They were said that if she is low risk that we do not necessarily need to do any repeat imaging but if we are concerned then we could repeat the CT in 1 year.  I have documented that she is a former smoker so that would put her at slight increased risk so I would recommend [place] repeating the CT in 1 year.  Also see if she is feeling any better with the pain medication today.

## 2022-12-28 ENCOUNTER — Telehealth: Payer: Self-pay

## 2022-12-28 DIAGNOSIS — I10 Essential (primary) hypertension: Secondary | ICD-10-CM | POA: Diagnosis not present

## 2022-12-28 DIAGNOSIS — Z87891 Personal history of nicotine dependence: Secondary | ICD-10-CM | POA: Diagnosis not present

## 2022-12-28 DIAGNOSIS — K635 Polyp of colon: Secondary | ICD-10-CM | POA: Diagnosis not present

## 2022-12-28 DIAGNOSIS — K625 Hemorrhage of anus and rectum: Secondary | ICD-10-CM | POA: Diagnosis not present

## 2022-12-28 DIAGNOSIS — E785 Hyperlipidemia, unspecified: Secondary | ICD-10-CM | POA: Diagnosis not present

## 2022-12-28 DIAGNOSIS — Z7984 Long term (current) use of oral hypoglycemic drugs: Secondary | ICD-10-CM | POA: Diagnosis not present

## 2022-12-28 DIAGNOSIS — K219 Gastro-esophageal reflux disease without esophagitis: Secondary | ICD-10-CM | POA: Diagnosis not present

## 2022-12-28 DIAGNOSIS — K649 Unspecified hemorrhoids: Secondary | ICD-10-CM | POA: Diagnosis not present

## 2022-12-28 DIAGNOSIS — Z794 Long term (current) use of insulin: Secondary | ICD-10-CM | POA: Diagnosis not present

## 2022-12-28 DIAGNOSIS — E119 Type 2 diabetes mellitus without complications: Secondary | ICD-10-CM | POA: Diagnosis not present

## 2022-12-28 DIAGNOSIS — Z79899 Other long term (current) drug therapy: Secondary | ICD-10-CM | POA: Diagnosis not present

## 2022-12-28 DIAGNOSIS — Z556 Problems related to health literacy: Secondary | ICD-10-CM | POA: Diagnosis not present

## 2022-12-28 DIAGNOSIS — Z7985 Long-term (current) use of injectable non-insulin antidiabetic drugs: Secondary | ICD-10-CM | POA: Diagnosis not present

## 2022-12-28 NOTE — Telephone Encounter (Signed)
Agree - go to ED immediately.
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
## 2022-12-30 ENCOUNTER — Telehealth: Payer: Self-pay | Admitting: Family Medicine

## 2022-12-30 NOTE — Telephone Encounter (Signed)
Returned patients call back, message was left on voicemail to contact patient back. Unable to leave message, no answer.

## 2022-12-31 NOTE — Telephone Encounter (Signed)
Patient went to ER.- dx: hemorrhoids.

## 2023-01-07 ENCOUNTER — Encounter: Payer: Self-pay | Admitting: Podiatry

## 2023-01-07 ENCOUNTER — Ambulatory Visit (INDEPENDENT_AMBULATORY_CARE_PROVIDER_SITE_OTHER): Payer: 59 | Admitting: Podiatry

## 2023-01-07 DIAGNOSIS — E1169 Type 2 diabetes mellitus with other specified complication: Secondary | ICD-10-CM | POA: Diagnosis not present

## 2023-01-07 DIAGNOSIS — L84 Corns and callosities: Secondary | ICD-10-CM | POA: Diagnosis not present

## 2023-01-07 DIAGNOSIS — M79674 Pain in right toe(s): Secondary | ICD-10-CM

## 2023-01-07 DIAGNOSIS — B351 Tinea unguium: Secondary | ICD-10-CM | POA: Diagnosis not present

## 2023-01-07 DIAGNOSIS — M79675 Pain in left toe(s): Secondary | ICD-10-CM | POA: Diagnosis not present
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
## 2023-01-10 ENCOUNTER — Telehealth: Payer: Self-pay | Admitting: Family Medicine

## 2023-01-10 DIAGNOSIS — K589 Irritable bowel syndrome without diarrhea: Secondary | ICD-10-CM | POA: Diagnosis not present

## 2023-01-10 DIAGNOSIS — Z86010 Personal history of colonic polyps: Secondary | ICD-10-CM | POA: Diagnosis not present

## 2023-01-10 DIAGNOSIS — K219 Gastro-esophageal reflux disease without esophagitis: Secondary | ICD-10-CM | POA: Diagnosis not present

## 2023-01-10 DIAGNOSIS — K921 Melena: Secondary | ICD-10-CM | POA: Diagnosis not present

## 2023-01-10 DIAGNOSIS — R1084 Generalized abdominal pain: Secondary | ICD-10-CM | POA: Diagnosis not present

## 2023-01-10 DIAGNOSIS — K649 Unspecified hemorrhoids: Secondary | ICD-10-CM | POA: Diagnosis not present

## 2023-01-10 NOTE — Telephone Encounter (Signed)
A representative from [employer] patient assistance called to inform you that the pt's order for Ozempic has been ordered. The pt's end enrollment date is 04/26/23, they can re-enroll on 01/28/23 for the next year.  Tracking I.D. [phone] Phone - [phone]

## 2023-01-11 ENCOUNTER — Telehealth: Payer: Self-pay

## 2023-01-11 NOTE — Telephone Encounter (Signed)
Routing the following message to [facility] as an [place]. Thanks in advance.

## 2023-01-11 NOTE — Telephone Encounter (Signed)
Forwarding to [place] as an Financial planner.  [employer] Nord[employer] PAP shipment for Ozempic 0.25/0.5 mg  dose / 5 boxes received this morning. Please contact the patient to come and pick up their order today. Placed in the PAP fridge with patient identifier. Thanks in advance.   NDC: [id number] LOT: [id number] EXP: 2024-05-26

## 2023-01-12 NOTE — Telephone Encounter (Signed)
Pt advised of medication shipment

## 2023-01-16 ENCOUNTER — Other Ambulatory Visit: Payer: Self-pay | Admitting: Family Medicine

## 2023-01-16 DIAGNOSIS — K2100 Gastro-esophageal reflux disease with esophagitis, without bleeding: Secondary | ICD-10-CM

## 2023-01-16 DIAGNOSIS — M898X1 Other specified disorders of bone, shoulder: Secondary | ICD-10-CM

## 2023-01-16 DIAGNOSIS — R079 Chest pain, unspecified: Secondary | ICD-10-CM

## 2023-01-19 ENCOUNTER — Telehealth: Payer: Self-pay

## 2023-01-19 NOTE — Telephone Encounter (Signed)
Error

## 2023-01-19 NOTE — Telephone Encounter (Signed)
Patient came by office to pick up 5 boxes of Ozempic, logged in white log book upfront, thanks.

## 2023-02-08 ENCOUNTER — Encounter: Payer: Self-pay | Admitting: Family Medicine

## 2023-02-08 ENCOUNTER — Ambulatory Visit (INDEPENDENT_AMBULATORY_CARE_PROVIDER_SITE_OTHER): Payer: 59 | Admitting: Family Medicine

## 2023-02-08 VITALS — Ht 64.0 in | Wt 174.0 lb

## 2023-02-08 DIAGNOSIS — R296 Repeated falls: Secondary | ICD-10-CM

## 2023-02-08 DIAGNOSIS — Z0000 Encounter for general adult medical examination without abnormal findings: Secondary | ICD-10-CM

## 2023-02-08 DIAGNOSIS — Z13820 Encounter for screening for osteoporosis: Secondary | ICD-10-CM | POA: Insufficient documentation
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
## 2023-02-08 NOTE — Patient Instructions (Signed)
Fat and Cholesterol Restricted Eating Plan Eating a diet that limits fat and cholesterol may help lower your risk for heart disease and other conditions. Your body needs fat and cholesterol for basic functions, but eating too much of these things can be harmful to your health. Your health care provider may order lab tests to check your blood fat (lipid) and cholesterol levels. This helps your health care provider understand your risk for certain conditions and whether you need to make diet changes. Work with your health care provider or dietitian to make an eating plan that is right for you. Your plan includes: Limit your fat intake to ______% or less of your total calories a day. This is ______g of fat per day. Limit your saturated fat intake to ______% or less of your total calories a day. This is ______g of saturated fat per day. Limit the amount of cholesterol in your diet to less than _________mg a day. Eat ___________ g of fiber a day. What are tips for following this plan? General guidelines If you are overweight, work with your health care provider to lose weight safely. Losing just 5-10% of your body weight can improve your overall health and help prevent diseases such as diabetes and heart disease. Avoid: Foods with added sugar. Fried foods. Foods that contain partially hydrogenated oils, including stick margarine, some tub margarines, cookies, crackers, and other baked goods. If you drink alcohol: Limit how much you have to: 0-1 drink a day for women who are not pregnant. 0-2 drinks a day for men. Know how much alcohol is in a drink. In the [place] one drink equals one 12 oz bottle of beer (355 mL), one 5 oz glass of wine (148 mL), or one 1 oz glass of hard liquor (44 mL). Reading food labels Check food labels for: Trans fats or partially hydrogenated oils. Avoid foods that contain these. High amounts of saturated fat. Choose foods that are low in saturated fat (less than 2 g). The  amount of cholesterol in each serving. The amount of fiber in each serving. Choose foods with healthy fats, such as: Monounsaturated and polyunsaturated fats. These include olive and canola oil, flaxseeds, walnuts, almonds, and seeds. Omega-3 fats. These are found in foods such as salmon, mackerel, sardines, tuna, flaxseed oil, and ground flaxseeds. Choose grain products that have whole grains. Look for the word "whole" as the first word in the ingredient list. Cooking Cook foods using methods other than frying. Baking, boiling, grilling, and broiling are some healthy options. Eat more home-cooked food and less restaurant, buffet, and fast food. Avoid cooking using saturated fats. Animal sources of saturated fats include meats, butter, and cream. Plant sources of saturated fats include palm oil, palm kernel oil, and coconut oil. Meal planning  At meals, imagine dividing your plate into fourths: Fill one-half of your plate with vegetables, green salads, and fruit. Fill one-fourth of your plate with whole grains. Fill one-fourth of your plate with lean protein foods. Eat fish that is high in omega-3 fats at least two times a week. Eat more foods that contain fiber, such as whole grains, beans, apples, pears, berries, broccoli, carrots, peas, and barley. These foods help promote healthy cholesterol levels in the blood. What foods should I eat? Fruits All fresh, canned (in natural juice), or frozen fruits. Vegetables Fresh or frozen vegetables (raw, steamed, roasted, or grilled). Green salads. Grains Whole grains, such as whole wheat or whole grain breads, crackers, cereals, and pasta. Unsweetened oatmeal, bulgur,  barley, quinoa, or brown rice. Corn or whole wheat flour tortillas. Meats and other proteins Ground beef (85% or leaner), grass-fed beef, or beef trimmed of fat. Skinless chicken or [place]. Ground chicken or [place]. Pork trimmed of fat. All fish and seafood. Egg whites. Dried beans,  peas, or lentils. Unsalted nuts or seeds. Unsalted canned beans. Natural nut butters without added sugar and oil. Dairy Low-fat or nonfat dairy products, such as skim or 1% milk, 2% or reduced-fat cheeses, low-fat and fat-free ricotta or cottage cheese, or plain low-fat and nonfat yogurt. Fats and oils Tub margarine without trans fats. Light or reduced-fat mayonnaise and salad dressings. Avocado. Olive, canola, sesame, or safflower oils. The items listed above may not be a complete list of foods and beverages you can eat. Contact a dietitian for more information. What foods should I avoid? Fruits Canned fruit in heavy syrup. Fruit in cream or butter sauce. Fried fruit. Vegetables Vegetables cooked in cheese, cream, or butter sauce. Fried vegetables. Grains White bread. White pasta. White rice. Cornbread. Bagels, pastries, and croissants. Crackers and snack foods that contain trans fat and hydrogenated oils. Meats and other proteins Fatty cuts of meat. Ribs, chicken wings, bacon, sausage, bologna, salami, chitterlings, fatback, hot dogs, bratwurst, and packaged lunch meats. Liver and organ meats. Whole eggs and egg yolks. Chicken and [place] with skin. Fried meat. Dairy Whole or 2% milk, cream, half-and-half, and cream cheese. Whole milk cheeses. Whole-fat or sweetened yogurt. Full-fat cheeses. Nondairy creamers and whipped toppings. Processed cheese, cheese spreads, and cheese curds. Fats and oils Butter, stick margarine, lard, shortening, ghee, or bacon fat. Coconut, palm kernel, and palm oils. Beverages Alcohol. Sugar-sweetened drinks such as sodas, lemonade, and fruit drinks. Sweets and desserts Corn syrup, sugars, honey, and molasses. Candy. Jam and jelly. Syrup. Sweetened cereals. Cookies, pies, cakes, donuts, muffins, and ice cream. The items listed above may not be a complete list of foods and beverages you should avoid. Contact a dietitian for more information. Summary Your body needs  fat and cholesterol for basic functions. However, eating too much of these things can be harmful to your health. Work with your health care provider and dietitian to follow a diet that limits fat and cholesterol. Doing this may help lower your risk for heart disease and other conditions. Choose healthy fats, such as monounsaturated and polyunsaturated fats, and foods high in omega-3 fatty acids. Eat fiber-rich foods, such as whole grains, beans, peas, fruits, and vegetables. Limit or avoid alcohol, fried foods, and foods high in saturated fats, partially hydrogenated oils, and sugar. This information is not intended to replace advice given to you by your health care provider. Make sure you discuss any questions you have with your health care provider. Document Revised: 08/22/2020 Document Reviewed: 08/22/2020 [employer] Patient Education  2024 [employer]  Health Maintenance, Female Adopting a healthy lifestyle and getting preventive care are important in promoting health and wellness. Ask your health care provider about: The right schedule for you to have regular tests and exams. Things you can do on your own to prevent diseases and keep yourself healthy. What should I know about diet, weight, and exercise? Eat a healthy diet  Eat a diet that includes plenty of vegetables, fruits, low-fat dairy products, and lean protein. Do not eat a lot of foods that are high in solid fats, added sugars, or sodium. Maintain a healthy weight Body mass index (BMI) is used to identify weight problems. It estimates body fat based on height and weight.  Your health care provider can help determine your BMI and help you achieve or maintain a healthy weight. Get regular exercise Get regular exercise. This is one of the most important things you can do for your health. Most adults should: Exercise for at least 150 minutes each week. The exercise should increase your heart rate and make you sweat (moderate-intensity  exercise). Do strengthening exercises at least twice a week. This is in addition to the moderate-intensity exercise. Spend less time sitting. Even light physical activity can be beneficial. Watch cholesterol and blood lipids Have your blood tested for lipids and cholesterol at 70 years of age, then have this test every 5 years. Have your cholesterol levels checked more often if: Your lipid or cholesterol levels are high. You are older than 70 years of age. You are at high risk for heart disease. What should I know about cancer screening? Depending on your health history and family history, you may need to have cancer screening at various ages. This may include screening for: Breast cancer. Cervical cancer. Colorectal cancer. Skin cancer. Lung cancer. What should I know about heart disease, diabetes, and high blood pressure? Blood pressure and heart disease High blood pressure causes heart disease and increases the risk of stroke. This is more likely to develop in people who have high blood pressure readings or are overweight. Have your blood pressure checked: Every 3-5 years if you are 55-4 years of age. Every year if you are 36 years old or older. Diabetes Have regular diabetes screenings. This checks your fasting blood sugar level. Have the screening done: Once every three years after age 81 if you are at a normal weight and have a low risk for diabetes. More often and at a younger age if you are overweight or have a high risk for diabetes. What should I know about preventing infection? Hepatitis B If you have a higher risk for hepatitis B, you should be screened for this virus. Talk with your health care provider to find out if you are at risk for hepatitis B infection. Hepatitis C Testing is recommended for: Everyone born from [birth year] through 1963. Anyone with known risk factors for hepatitis C. Sexually transmitted infections (STIs) Get screened for STIs, including gonorrhea and  chlamydia, if: You are sexually active and are younger than 70 years of age. You are older than 70 years of age and your health care provider tells you that you are at risk for this type of infection. Your sexual activity has changed since you were last screened, and you are at increased risk for chlamydia or gonorrhea. Ask your health care provider if you are at risk. Ask your health care provider about whether you are at high risk for HIV. Your health care provider may recommend a prescription medicine to help prevent HIV infection. If you choose to take medicine to prevent HIV, you should first get tested for HIV. You should then be tested every 3 months for as long as you are taking the medicine. Pregnancy If you are about to stop having your period (premenopausal) and you may become pregnant, seek counseling before you get pregnant. Take 400 to 800 micrograms (mcg) of folic acid every day if you become pregnant. Ask for birth control (contraception) if you want to prevent pregnancy. Osteoporosis and menopause Osteoporosis is a disease in which the bones lose minerals and strength with aging. This can result in bone fractures. If you are 59 years old or older, or if you are  at risk for osteoporosis and fractures, ask your health care provider if you should: Be screened for bone loss. Take a calcium or vitamin D supplement to lower your risk of fractures. Be given hormone replacement therapy (HRT) to treat symptoms of menopause. Follow these instructions at home: Alcohol use Do not drink alcohol if: Your health care provider tells you not to drink. You are pregnant, may be pregnant, or are planning to become pregnant. If you drink alcohol: Limit how much you have to: 0-1 drink a day. Know how much alcohol is in your drink. In the [place] one drink equals one 12 oz bottle of beer (355 mL), one 5 oz glass of wine (148 mL), or one 1 oz glass of hard liquor (44 mL). Lifestyle Do not use any  products that contain nicotine or tobacco. These products include cigarettes, chewing tobacco, and vaping devices, such as e-cigarettes. If you need help quitting, ask your health care provider. Do not use street drugs. Do not share needles. Ask your health care provider for help if you need support or information about quitting drugs. General instructions Schedule regular health, dental, and eye exams. Stay current with your vaccines. Tell your health care provider if: You often feel depressed. You have ever been abused or do not feel safe at home. Summary Adopting a healthy lifestyle and getting preventive care are important in promoting health and wellness. Follow your health care provider's instructions about healthy diet, exercising, and getting tested or screened for diseases. Follow your health care provider's instructions on monitoring your cholesterol and blood pressure. This information is not intended to replace advice given to you by your health care provider. Make sure you discuss any questions you have with your health care provider. Document Revised: 09/01/2020 Document Reviewed: 09/01/2020 [employer] Patient Education  2024 [employer].
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
## 2023-02-10 ENCOUNTER — Ambulatory Visit: Payer: 59 | Attending: Family Medicine

## 2023-02-10 ENCOUNTER — Other Ambulatory Visit: Payer: Self-pay

## 2023-02-10 DIAGNOSIS — M6281 Muscle weakness (generalized): Secondary | ICD-10-CM | POA: Diagnosis not present

## 2023-02-10 DIAGNOSIS — R296 Repeated falls: Secondary | ICD-10-CM | POA: Insufficient documentation

## 2023-02-10 DIAGNOSIS — R293 Abnormal posture: Secondary | ICD-10-CM | POA: Diagnosis not present

## 2023-02-10 DIAGNOSIS — Z0000 Encounter for general adult medical examination without abnormal findings: Secondary | ICD-10-CM | POA: Diagnosis present

## 2023-02-10 DIAGNOSIS — R2681 Unsteadiness on feet: Secondary | ICD-10-CM | POA: Diagnosis not present

## 2023-02-10 DIAGNOSIS — M5459 Other low back pain: Secondary | ICD-10-CM | POA: Diagnosis not present

## 2023-02-15 ENCOUNTER — Other Ambulatory Visit: Payer: Self-pay | Admitting: Family Medicine

## 2023-02-15 ENCOUNTER — Ambulatory Visit: Payer: 59

## 2023-02-15 DIAGNOSIS — R293 Abnormal posture: Secondary | ICD-10-CM | POA: Diagnosis not present

## 2023-02-15 DIAGNOSIS — M6281 Muscle weakness (generalized): Secondary | ICD-10-CM

## 2023-02-15 DIAGNOSIS — R296 Repeated falls: Secondary | ICD-10-CM

## 2023-02-15 DIAGNOSIS — M5459 Other low back pain: Secondary | ICD-10-CM

## 2023-02-15 DIAGNOSIS — R2681 Unsteadiness on feet: Secondary | ICD-10-CM

## 2023-02-15 DIAGNOSIS — K2100 Gastro-esophageal reflux disease with esophagitis, without bleeding: Secondary | ICD-10-CM
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
## 2023-02-16 ENCOUNTER — Ambulatory Visit: Payer: 59

## 2023-02-16 ENCOUNTER — Other Ambulatory Visit: Payer: Self-pay | Admitting: *Deleted

## 2023-02-16 ENCOUNTER — Other Ambulatory Visit: Payer: Self-pay | Admitting: Family Medicine

## 2023-02-16 DIAGNOSIS — E119 Type 2 diabetes mellitus without complications: Secondary | ICD-10-CM

## 2023-02-16 DIAGNOSIS — M85852 Other specified disorders of bone density and structure, left thigh: Secondary | ICD-10-CM | POA: Diagnosis not present

## 2023-02-16 DIAGNOSIS — Z780 Asymptomatic menopausal state: Secondary | ICD-10-CM | POA: Diagnosis not present

## 2023-02-16 DIAGNOSIS — Z13820 Encounter for screening for osteoporosis: Secondary | ICD-10-CM | POA: Diagnosis not present

## 2023-02-16 MED ORDER — ACCU-CHEK GUIDE VI STRP: ORAL_STRIP | 33 refills | Status: AC

## 2023-02-18 ENCOUNTER — Other Ambulatory Visit: Payer: 59

## 2023-02-18 ENCOUNTER — Encounter: Payer: Self-pay | Admitting: Family Medicine

## 2023-02-18 DIAGNOSIS — M8580 Other specified disorders of bone density and structure, unspecified site: Secondary | ICD-10-CM | POA: Insufficient documentation

## 2023-02-18 DIAGNOSIS — Z780 Asymptomatic menopausal state: Secondary | ICD-10-CM | POA: Insufficient documentation

## 2023-02-18 NOTE — Progress Notes (Signed)
Hi Amanda Morris, bone density shows a T-score of -1.5 this is consistent with mildly thin bones also known as osteopenia.  There has also been a slight decrease in your bone density compared to your prior scan in 2019.   The current recommendation for osteopenia (mildly thin bones) treatment includes:   #1 calcium-total of 1200 mg of calcium daily.  If you eat a very calcium rich diet you may be able to obtain that without a supplement.  If not, then I recommend calcium 500 mg twice a day.  There are several products over-the-counter such as Caltrate D and Viactiv chews which are great options that contain calcium and vitamin D. #2 vitamin D-recommend 800 international units daily. #3 exercise-recommend 30 minutes of weightbearing exercise 3 days a week.  Resistance training ,such as doing bands and light weights, can be particularly helpful.

## 2023-02-22 ENCOUNTER — Ambulatory Visit: Payer: 59

## 2023-02-22 DIAGNOSIS — R293 Abnormal posture: Secondary | ICD-10-CM

## 2023-02-22 DIAGNOSIS — R2681 Unsteadiness on feet: Secondary | ICD-10-CM | POA: Diagnosis not present

## 2023-02-22 DIAGNOSIS — R296 Repeated falls: Secondary | ICD-10-CM

## 2023-02-22 DIAGNOSIS — M5459 Other low back pain: Secondary | ICD-10-CM | POA: Diagnosis not present

## 2023-02-22 DIAGNOSIS — M6281 Muscle weakness (generalized): Secondary | ICD-10-CM
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
## 2023-02-23 DIAGNOSIS — G603 Idiopathic progressive neuropathy: Secondary | ICD-10-CM | POA: Diagnosis not present

## 2023-02-23 DIAGNOSIS — M7918 Myalgia, other site: Secondary | ICD-10-CM | POA: Diagnosis not present

## 2023-02-25 ENCOUNTER — Other Ambulatory Visit: Payer: Self-pay | Admitting: Family Medicine

## 2023-02-27 ENCOUNTER — Other Ambulatory Visit: Payer: Self-pay | Admitting: Family Medicine

## 2023-02-27 DIAGNOSIS — M898X1 Other specified disorders of bone, shoulder: Secondary | ICD-10-CM

## 2023-02-27 DIAGNOSIS — R079 Chest pain, unspecified: Secondary | ICD-10-CM

## 2023-02-27 DIAGNOSIS — E119 Type 2 diabetes mellitus without complications: Secondary | ICD-10-CM

## 2023-02-28 NOTE — Telephone Encounter (Signed)
Patient called she needs refills on Accu-Check Lancets Iancets Please submit to [facility] [address] [place]  Phone Number [phone]

## 2023-03-01 ENCOUNTER — Ambulatory Visit: Payer: 59 | Attending: Family Medicine

## 2023-03-01 DIAGNOSIS — R2681 Unsteadiness on feet: Secondary | ICD-10-CM

## 2023-03-01 DIAGNOSIS — M5459 Other low back pain: Secondary | ICD-10-CM

## 2023-03-01 DIAGNOSIS — R293 Abnormal posture: Secondary | ICD-10-CM | POA: Diagnosis not present

## 2023-03-01 DIAGNOSIS — M6281 Muscle weakness (generalized): Secondary | ICD-10-CM | POA: Diagnosis not present

## 2023-03-01 DIAGNOSIS — R296 Repeated falls: Secondary | ICD-10-CM | POA: Diagnosis not present

## 2023-03-01 MED ORDER — ACCU-CHEK SOFTCLIX LANCETS MISC: 91 refills | Status: DC

## 2023-03-01 NOTE — Telephone Encounter (Signed)
Pt's lancets, test strips, sucralfate were sent on 10/23
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
## 2023-03-04 ENCOUNTER — Ambulatory Visit: Payer: 59

## 2023-03-04 DIAGNOSIS — R2681 Unsteadiness on feet: Secondary | ICD-10-CM

## 2023-03-04 DIAGNOSIS — R293 Abnormal posture: Secondary | ICD-10-CM | POA: Diagnosis not present

## 2023-03-04 DIAGNOSIS — M6281 Muscle weakness (generalized): Secondary | ICD-10-CM

## 2023-03-04 DIAGNOSIS — M5459 Other low back pain: Secondary | ICD-10-CM | POA: Diagnosis not present

## 2023-03-04 DIAGNOSIS — R296 Repeated falls: Secondary | ICD-10-CM
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
## 2023-03-07 ENCOUNTER — Encounter: Payer: Self-pay | Admitting: Family Medicine

## 2023-03-07 ENCOUNTER — Ambulatory Visit (INDEPENDENT_AMBULATORY_CARE_PROVIDER_SITE_OTHER): Payer: 59 | Admitting: Family Medicine

## 2023-03-07 VITALS — BP 127/72 | HR 91 | Ht 64.0 in | Wt 171.0 lb

## 2023-03-07 DIAGNOSIS — E119 Type 2 diabetes mellitus without complications: Secondary | ICD-10-CM

## 2023-03-07 DIAGNOSIS — R079 Chest pain, unspecified: Secondary | ICD-10-CM | POA: Diagnosis not present

## 2023-03-07 DIAGNOSIS — I10 Essential (primary) hypertension: Secondary | ICD-10-CM

## 2023-03-07 DIAGNOSIS — Z23 Encounter for immunization: Secondary | ICD-10-CM

## 2023-03-07 LAB — POCT GLYCOSYLATED HEMOGLOBIN (HGB A1C): Hemoglobin A1C: 6 % — AB (ref 4.0–5.6)

## 2023-03-07 MED ORDER — OZEMPIC (0.25 OR 0.5 MG/DOSE) 2 MG/1.5ML ~~LOC~~ SOPN: 0.5000 mg | PEN_INJECTOR | SUBCUTANEOUS | 1 refills | Status: DC

## 2023-03-07 NOTE — Assessment & Plan Note (Signed)
Well controlled. Continue current regimen. Follow up in  3-4
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
## 2023-03-08 ENCOUNTER — Ambulatory Visit: Payer: 59

## 2023-03-08 DIAGNOSIS — M5412 Radiculopathy, cervical region: Secondary | ICD-10-CM | POA: Diagnosis not present

## 2023-03-08 DIAGNOSIS — G5623 Lesion of ulnar nerve, bilateral upper limbs: Secondary | ICD-10-CM | POA: Diagnosis not present

## 2023-03-08 DIAGNOSIS — M7501 Adhesive capsulitis of right shoulder: Secondary | ICD-10-CM | POA: Diagnosis not present

## 2023-03-08 DIAGNOSIS — M542 Cervicalgia: Secondary | ICD-10-CM | POA: Diagnosis not present

## 2023-03-08 DIAGNOSIS — M47812 Spondylosis without myelopathy or radiculopathy, cervical region: Secondary | ICD-10-CM | POA: Diagnosis not present

## 2023-03-08 DIAGNOSIS — E119 Type 2 diabetes mellitus without complications: Secondary | ICD-10-CM | POA: Diagnosis not present

## 2023-03-08 DIAGNOSIS — M5031 Other cervical disc degeneration,  high cervical region: Secondary | ICD-10-CM | POA: Diagnosis not present

## 2023-03-08 DIAGNOSIS — M5030 Other cervical disc degeneration, unspecified cervical region: Secondary | ICD-10-CM | POA: Diagnosis not present

## 2023-03-08 DIAGNOSIS — M7502 Adhesive capsulitis of left shoulder: Secondary | ICD-10-CM | POA: Diagnosis not present

## 2023-03-08 DIAGNOSIS — M50321 Other cervical disc degeneration at C4-C5 level: Secondary | ICD-10-CM | POA: Diagnosis not present

## 2023-03-08 LAB — CMP14+EGFR
ALT: 48 [IU]/L — ABNORMAL HIGH (ref 0–32)
AST: 35 [IU]/L (ref 0–40)
Albumin: 3.6 g/dL — ABNORMAL LOW (ref 3.9–4.9)
Alkaline Phosphatase: 100 [IU]/L (ref 44–121)
BUN/Creatinine Ratio: 9 — ABNORMAL LOW (ref 12–28)
BUN: 6 mg/dL — ABNORMAL LOW (ref 8–27)
Bilirubin Total: 0.2 mg/dL (ref 0.0–1.2)
CO2: 28 mmol/L (ref 20–29)
Calcium: 8.7 mg/dL (ref 8.7–10.3)
Chloride: 103 mmol/L (ref 96–106)
Creatinine, Ser: 0.69 mg/dL (ref 0.57–1.00)
Globulin, Total: 2.6 g/dL (ref 1.5–4.5)
Glucose: 82 mg/dL (ref 70–99)
Potassium: 3.1 mmol/L — ABNORMAL LOW (ref 3.5–5.2)
Sodium: 145 mmol/L — ABNORMAL HIGH (ref 134–144)
Total Protein: 6.2 g/dL (ref 6.0–8.5)
eGFR: 93 mL/min/{1.73_m2} (ref >59)

## 2023-03-08 NOTE — Progress Notes (Signed)
HI Amanda Morris, which is slightly elevated potassium was just a little low.  I am not quite sure what is caused the shift.  I do want a recheck that in a couple of weeks.  One of your liver enzymes is elevated again similar to about a year ago so just continue work on healthy diet and we will recheck that again in 2 to 3 weeks.

## 2023-03-11 ENCOUNTER — Ambulatory Visit: Payer: 59

## 2023-03-14 NOTE — Patient Outreach (Signed)
Attempted to contact patient regarding [facility]. Left voicemail for patient to return my call at [phone].  Nicholes Rough, CMA Care Guide [employer]

## 2023-03-15 ENCOUNTER — Ambulatory Visit: Payer: 59

## 2023-03-15 DIAGNOSIS — R296 Repeated falls: Secondary | ICD-10-CM | POA: Diagnosis not present

## 2023-03-15 DIAGNOSIS — R2681 Unsteadiness on feet: Secondary | ICD-10-CM

## 2023-03-15 DIAGNOSIS — R293 Abnormal posture: Secondary | ICD-10-CM

## 2023-03-15 DIAGNOSIS — M6281 Muscle weakness (generalized): Secondary | ICD-10-CM | POA: Diagnosis not present

## 2023-03-15 DIAGNOSIS — M5459 Other low back pain: Secondary | ICD-10-CM
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
## 2023-03-18 ENCOUNTER — Other Ambulatory Visit: Payer: Self-pay | Admitting: *Deleted

## 2023-03-18 DIAGNOSIS — R899 Unspecified abnormal finding in specimens from other organs, systems and tissues: Secondary | ICD-10-CM

## 2023-03-19 ENCOUNTER — Other Ambulatory Visit: Payer: Self-pay | Admitting: Family Medicine

## 2023-03-19 DIAGNOSIS — R079 Chest pain, unspecified: Secondary | ICD-10-CM

## 2023-03-19 DIAGNOSIS — M898X1 Other specified disorders of bone, shoulder: Secondary | ICD-10-CM

## 2023-03-21 ENCOUNTER — Ambulatory Visit: Payer: 59

## 2023-03-22 DIAGNOSIS — G43709 Chronic migraine without aura, not intractable, without status migrainosus: Secondary | ICD-10-CM | POA: Diagnosis not present

## 2023-03-22 DIAGNOSIS — Z79899 Other long term (current) drug therapy: Secondary | ICD-10-CM | POA: Diagnosis not present

## 2023-03-22 DIAGNOSIS — M7918 Myalgia, other site: Secondary | ICD-10-CM | POA: Diagnosis not present

## 2023-03-23 ENCOUNTER — Other Ambulatory Visit: Payer: Self-pay | Admitting: *Deleted

## 2023-03-23 ENCOUNTER — Ambulatory Visit: Payer: 59

## 2023-03-23 DIAGNOSIS — R2681 Unsteadiness on feet: Secondary | ICD-10-CM | POA: Diagnosis not present

## 2023-03-23 DIAGNOSIS — M5459 Other low back pain: Secondary | ICD-10-CM

## 2023-03-23 DIAGNOSIS — R899 Unspecified abnormal finding in specimens from other organs, systems and tissues: Secondary | ICD-10-CM

## 2023-03-23 DIAGNOSIS — R293 Abnormal posture: Secondary | ICD-10-CM | POA: Diagnosis not present

## 2023-03-23 DIAGNOSIS — M6281 Muscle weakness (generalized): Secondary | ICD-10-CM

## 2023-03-23 DIAGNOSIS — E232 Diabetes insipidus: Secondary | ICD-10-CM

## 2023-03-23 DIAGNOSIS — R296 Repeated falls: Secondary | ICD-10-CM | POA: Diagnosis not present
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
## 2023-03-24 LAB — CMP14+EGFR
ALT: 32 [IU]/L (ref 0–32)
AST: 24 [IU]/L (ref 0–40)
Albumin: 4.1 g/dL (ref 3.9–4.9)
Alkaline Phosphatase: 109 [IU]/L (ref 44–121)
BUN/Creatinine Ratio: 9 — ABNORMAL LOW (ref 12–28)
BUN: 7 mg/dL — ABNORMAL LOW (ref 8–27)
Bilirubin Total: 0.3 mg/dL (ref 0.0–1.2)
CO2: 22 mmol/L (ref 20–29)
Calcium: 9 mg/dL (ref 8.7–10.3)
Chloride: 104 mmol/L (ref 96–106)
Creatinine, Ser: 0.74 mg/dL (ref 0.57–1.00)
Globulin, Total: 3 g/dL (ref 1.5–4.5)
Glucose: 94 mg/dL (ref 70–99)
Potassium: 3.9 mmol/L (ref 3.5–5.2)
Sodium: 146 mmol/L — ABNORMAL HIGH (ref 134–144)
Total Protein: 7.1 g/dL (ref 6.0–8.5)
eGFR: 87 mL/min/{1.73_m2} (ref >59)

## 2023-03-28 DIAGNOSIS — Z1231 Encounter for screening mammogram for malignant neoplasm of breast: Secondary | ICD-10-CM | POA: Diagnosis not present

## 2023-03-28 DIAGNOSIS — R92323 Mammographic fibroglandular density, bilateral breasts: Secondary | ICD-10-CM | POA: Diagnosis not present

## 2023-03-28 LAB — HM MAMMOGRAPHY

## 2023-03-29 ENCOUNTER — Ambulatory Visit: Payer: 59

## 2023-03-30 ENCOUNTER — Encounter: Payer: Self-pay | Admitting: Family Medicine

## 2023-03-30 DIAGNOSIS — M7522 Bicipital tendinitis, left shoulder: Secondary | ICD-10-CM | POA: Diagnosis not present

## 2023-03-30 DIAGNOSIS — M25512 Pain in left shoulder: Secondary | ICD-10-CM | POA: Diagnosis not present

## 2023-03-30 DIAGNOSIS — M7552 Bursitis of left shoulder: Secondary | ICD-10-CM | POA: Diagnosis not present

## 2023-03-30 DIAGNOSIS — M19011 Primary osteoarthritis, right shoulder: Secondary | ICD-10-CM | POA: Diagnosis not present

## 2023-04-01 NOTE — Addendum Note (Signed)
Addended by: Deno Etienne on: 04/01/2023 11:19 AM   Modules accepted: Orders

## 2023-04-01 NOTE — Progress Notes (Signed)
Hi Amanda Morris, your repeat sodium level is still high.  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  If you are okay with this please let me know there is an endocrinologist here in [place] if that would be convenient for you.

## 2023-04-05 ENCOUNTER — Ambulatory Visit: Payer: 59 | Attending: Family Medicine

## 2023-04-05 DIAGNOSIS — R2681 Unsteadiness on feet: Secondary | ICD-10-CM | POA: Diagnosis not present

## 2023-04-05 DIAGNOSIS — R296 Repeated falls: Secondary | ICD-10-CM | POA: Insufficient documentation

## 2023-04-05 DIAGNOSIS — M6281 Muscle weakness (generalized): Secondary | ICD-10-CM | POA: Diagnosis not present

## 2023-04-05 DIAGNOSIS — M5459 Other low back pain: Secondary | ICD-10-CM | POA: Insufficient documentation

## 2023-04-05 DIAGNOSIS — R293 Abnormal posture: Secondary | ICD-10-CM | POA: Insufficient documentation
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
## 2023-04-06 ENCOUNTER — Telehealth: Payer: Self-pay | Admitting: Podiatry

## 2023-04-06 NOTE — Telephone Encounter (Signed)
Pt left message today at 720am about her appt scheduled for Friday.  I returned call and not able to complete call or leave a message.

## 2023-04-07 ENCOUNTER — Encounter: Payer: Self-pay | Admitting: Family Medicine

## 2023-04-08 ENCOUNTER — Ambulatory Visit: Payer: Self-pay | Admitting: Family Medicine

## 2023-04-08 ENCOUNTER — Ambulatory Visit: Payer: 59 | Admitting: Podiatry

## 2023-04-08 NOTE — Telephone Encounter (Signed)
  Chief Complaint: Abdominal Pain Symptoms: Abdominal Pain, Nausea/Vomiting Frequency: since yesterday Pertinent Negatives: Patient denies fever Disposition: [x] ED /[] Urgent Care (no appt availability in office) / [] Appointment(In office/virtual)/ []  [facility]/ [] Home Care/ [] Refused Recommended Disposition /[]  Mobile Bus/ []  Follow-up with PCP Additional Notes: patient was seen yesterday at PCP with new complaints of abdominal pain with nausea/vomiting. Pt states she was given medication that hasn't helped. She has vomited twice today and unable to keep food down. Unable to schedule an appointment in timely matter. Per protocol, patient is given the recommendation of ED to be evaluated. Patient verbalizes understanding with plan. All questions answered   Copied from CRM [id number]. Topic: Clinical - Red Word Triage >> Apr 08, 2023  2:35 PM Larwance Sachs wrote: Red Word that prompted transfer to Nurse Triage: Patient called in regarding having stomach problems. Patient is throwing up and having stomach pain that is keeping her up overnight. Patient is taking sucralfate (CARAFATE) 1 g tablet and is not working. Reason for Disposition  Patient sounds very sick or weak to the triager  Answer Assessment - Initial Assessment Questions 1. LOCATION: "Where does it hurt?"      Mid abdomen 2. RADIATION: "Does the pain shoot anywhere else?" (e.g., chest, back)     Not radiating 3. ONSET: "When did the pain begin?" (e.g., minutes, hours or days ago)      Yesterday morning 4. SUDDEN: "Gradual or sudden onset?"     sudden 5. PATTERN "Does the pain come and go, or is it constant?"    - If it comes and goes: "How long does it last?" "Do you have pain now?"     (Note: Comes and goes means the pain is intermittent. It goes away completely between bouts.)    - If constant: "Is it getting better, staying the same, or getting worse?"      (Note: Constant means the pain never goes away  completely; most serious pain is constant and gets worse.)      constant 6. SEVERITY: "How bad is the pain?"  (e.g., Scale 1-10; mild, moderate, or severe)    - MILD (1-3): Doesn't interfere with normal activities, abdomen soft and not tender to touch.     - MODERATE (4-7): Interferes with normal activities or awakens from sleep, abdomen tender to touch.     - SEVERE (8-10): Excruciating pain, doubled over, unable to do any normal activities.       10 7. RECURRENT SYMPTOM: "Have you ever had this type of stomach pain before?" If Yes, ask: "When was the last time?" and "What happened that time?"      No 8. CAUSE: "What do you think is causing the stomach pain?"     Not sure 9. RELIEVING/AGGRAVATING FACTORS: "What makes it better or worse?" (e.g., antacids, bending or twisting motion, bowel movement)     Laying down makes it worse. 10. OTHER SYMPTOMS: "Do you have any other symptoms?" (e.g., back pain, diarrhea, fever, urination pain, vomiting)       vomiting  Protocols used: Abdominal Pain - Female-A-AH

## 2023-04-12 ENCOUNTER — Ambulatory Visit: Payer: 59

## 2023-04-12 DIAGNOSIS — R296 Repeated falls: Secondary | ICD-10-CM

## 2023-04-12 DIAGNOSIS — M5459 Other low back pain: Secondary | ICD-10-CM

## 2023-04-12 DIAGNOSIS — R2681 Unsteadiness on feet: Secondary | ICD-10-CM

## 2023-04-12 DIAGNOSIS — R293 Abnormal posture: Secondary | ICD-10-CM

## 2023-04-12 DIAGNOSIS — M6281 Muscle weakness (generalized): Secondary | ICD-10-CM

## 2023-04-12 NOTE — Therapy (Signed)
OUTPATIENT PHYSICAL THERAPY TREATMENT   Patient Name: Amanda Morris MRN: [id number] DOB:[birth year], 72 y.o., female Today's Date: 04/12/2023  END OF SESSION:  PT End of Session - 04/12/23 0850     Visit Number 9    Number of Visits 25    Date for PT Re-Evaluation 05/07/23    Authorization Type UHC MCR/MCD    Progress Note Due on Visit 10    PT Start Time 0848    PT Stop Time 0926    PT Time Calculation (min) 38 min    Activity Tolerance Patient tolerated treatment well    Behavior During Therapy WFL for tasks assessed/performed             Past Medical History:  Diagnosis Date   Allergy    Diabetes mellitus without complication (HCC)    type 2   GERD (gastroesophageal reflux disease)    Headache    History of hiatal hernia    Hypercholesterolemia    Numbness    left, outer thigh   Palpitations    in the past, no current issues per patient   Postlaminectomy syndrome    Past Surgical History:  Procedure Laterality Date   ABDOMINAL HYSTERECTOMY  1982   BACK SURGERY     x 2    BREAST REDUCTION SURGERY Bilateral 03/17/2016   Procedure: MAMMARY REDUCTION  (BREAST);  Surgeon: Peggye Form, DO;  Location:  SUR[facility]ERY C[facility];  Service: Plastics;  Laterality: Bilateral;   BREAST SURGERY     COLONOSCOPY     LUMBAR WOUND DEBRIDEMENT N/A 05/22/2020   Procedure: Incision and drainage of lumbar wound;  Surgeon: Coletta Memos, MD;  Location: [facility] OR;  Service: Neurosurgery;  Laterality: N/A;   SHOULDER SURGERY  06/2008, 09/2010   Right, by Dr. Anthoney Harada, then Dr. Bayard Beaver   SPINAL CORD STIMULATOR INSERTION N/A 12/02/2017   Procedure: LUMBAR SPINAL CORD STIMULATOR INSERTION;  Surgeon: Coletta Memos, MD;  Location: [facility] OR;  Service: Neurosurgery;  Laterality: N/A;   SPINAL CORD STIMULATOR TRIAL N/A 11/25/2017   Procedure: INSERTION LUMBAR SPINAL CORD STIMULATOR TRIAL  ;  Surgeon: Coletta Memos, MD;  Location: [facility] OR;  Service: Neurosurgery;  Laterality: N/A;    INSERTION LUMBAR SPINAL CORD STIMULATOR TRIAL     UPPER GI ENDOSCOPY     Patient Active Problem List   Diagnosis Date Noted   Osteopenia after menopause 02/18/2023   Frequent falls 02/10/2023   Screening for osteoporosis 02/08/2023   Lung nodule 12/24/2022   Atherosclerosis of aorta (HCC) 11/02/2022   Right flank pain 08/17/2022   Left hip pain 02/23/2021   Low back pain 02/23/2021   Gait abnormality 02/23/2021   Bilateral lower extremity edema 01/22/2021   Elevated CK 10/12/2020   Spinal stenosis 10/09/2020   Lumbar adjacent segment disease with spondylolisthesis 10/09/2020   Spinal stenosis, lumbar region with neurogenic claudication 04/16/2020   Peripheral neuropathy 11/28/2019   Paresthesia and pain of both upper extremities 11/12/2019   Chronic midline low back pain with bilateral sciatica 10/30/2019   Left thigh pain 10/30/2019   Meralgia paresthetica, left 09/12/2019   Tommi Rumps Quervain's disease (tenosynovitis) 02/06/2019   Acute bilateral low back pain without sciatica 12/01/2018   Failed back syndrome of lumbar spine 11/25/2017   Inflammation of sacroiliac joint (HCC) 06/07/2017   Displacement of lumbar intervertebral disc 11/22/2016   Fatty liver 09/27/2016   Status post bilateral breast reduction 03/23/2016   Hypertrophy of breast 08/12/2015   Spondylolisthesis of lumbar region 06/13/2015  History of lumbar fusion 04/07/2015   Radiculopathy, lumbar region 04/07/2015   Essential hypertension 02/14/2015   Pelvic pain 12/02/2014   Postprandial vomiting 02/04/2014   Obesity (BMI 30-39.9) 11/01/2013   Carotid artery stenosis 06/07/2013   Hereditary and idiopathic peripheral neuropathy 06/07/2013   History of migraine 05/04/2013   Type 2 diabetes mellitus with other specified complication (HCC) 05/05/2011   SVT (supraventricular tachycardia) (HCC) 11/03/2010   GERD (gastroesophageal reflux disease) 10/29/2010   Chronic low back pain 10/29/2010   Hyperlipidemia  associated with type 2 diabetes mellitus (HCC) 10/29/2010    PCP: Agapito Games, MD  REFERRING PROVIDER:  Novella Olive, FNP (encounter for medicare annual wellness exam)   Agapito Games, MD (other diagnoses)   REFERRING DIAG:  Z00.00 (ICD-10-CM) - Encounter for Medicare annual wellness exam   R07.9 (ICD-10-CM) - Right-sided chest pain  M89.8X1 (ICD-10-CM) - Clavicle pain  M54.50 (ICD-10-CM) - Acute bilateral low back pain without sciatica  S20.01XA (ICD-10-CM) - Contusion of right breast, initial encounter   Rationale for Evaluation and Treatment: Rehabilitation  THERAPY DIAG:  Other low back pain  Unsteadiness on feet  Repeated falls  Muscle weakness (generalized)  Abnormal posture  ONSET DATE: chronic    SUBJECTIVE:                                                                                                                                                                                           SUBJECTIVE STATEMENT: Patient reports she has had no falls since last PT visit; patient has been busy with doctors visits but has plans to return to gym in a few weeks.   PERTINENT HISTORY:  Back surgery x 2 Breast surgery  Lumbar wound debridement  Rt shoulder surgery x 2  Spinal cord stimulator   PAIN:  Are you having pain? Yes: NPRS scale: none currently; at worst 8/10 Pain location: low back/posterior hips Pain description: sore Aggravating factors: household activity Relieving factors: rest  PRECAUTIONS:  Fall  RED FLAGS: None   WEIGHT BEARING RESTRICTIONS:  No  FALLS:  Has patient fallen in last 6 months? Yes. Number of falls 4; see subjective  LIVING ENVIRONMENT: Lives with: lives alone Lives in: House/apartment Stairs: No Has following equipment at home: Single point cane, Environmental consultant - 2 wheeled, shower chair, and Grab bars  OCCUPATION:  Retired   PLOF:  Independent  PATIENT GOALS:  "Be able to do things much better and  work around the house. No more falls."  NEXT MD VISIT: March 2025   OBJECTIVE:  Note: Objective measures were completed at Evaluation unless otherwise noted.  DIAGNOSTIC FINDINGS:  No recent imaging on file   PATIENT SURVEYS:  ABC scale 43% self-confidence   COGNITIVE STATUS: Within functional limits for tasks assessed   SENSATION: WFL   POSTURE:  rounded shoulders, forward head, and increased thoracic kyphosis   GAIT: Distance walked: 10 ft  Assistive device utilized: Single point cane Level of assistance: Modified independence Comments: decreased step length; limited push-off, narrow base; no arm swing, forward flexed posture    Body Part #1 Lumbar   LUMBAR ROM:   Active  A/PROM  eval  Flexion 50% limited  Extension 75% limited   Right lateral flexion WFL  Left lateral flexion WFL  Right rotation 50% limited  Left rotation 50% limited   (Blank rows = not tested); pain with all lumbar AROM   LOWER EXTREMITY MMT:    MMT Right eval Left eval 03/15/23  Hip flexion 3+ 3+   Hip extension     Hip abduction 3+ 3+   Hip adduction     Hip internal rotation     Hip external rotation     Knee flexion 4 4   Knee extension 4 4   Ankle dorsiflexion 5 5   Ankle plantarflexion Partial range DL calf raise Partial range DL calf raise Minimal range SL calf raise bilaterally   Ankle inversion     Ankle eversion 5 5    (Blank rows = not tested)    SPECIAL TESTS:  SLR (+)   FUNCTIONAL TESTS:  30 second chair test: 4 reps TUG: 24 seconds  03/15/23:  30 second chair test: 10 reps  TUG: 17.3 seconds      BERG BALANCE TEST Sitting to Standing: 3.      Stands independently using hands Standing Unsupported: 3.      Stands 2 minutes with supervision Sitting Unsupported: 4.     Sits for 2 minutes independently Standing to Sitting: 3.     Controls descent with hands  Transfers: 3.     Transfers safely definite use of hands Standing with eyes closed: 3.      Stands 10 seconds with supervision Standing with feet together: 3.     Stands for 1 minute with supervision Reaching forward with outstretched arm: 3.     Reaches forward 5 inches Retrieving object from the floor: 4.      Able to pick up easily and safely Turning to look behind: 4.     Looks behind from both sides and weight shifts well Turning 360 degrees: 2.     Able to turn slowly, but safely Place alternate foot on stool: 0.     Unable, needs assist to keep from falling Standing with one foot in front: 2.     Independent small step for 30 seconds Standing on one foot: 2.     Holds >/=3 seconds  Total Score: 39/56   [facility] Adult PT Treatment:                                                DATE: 04/12/2023 Therapeutic Exercise: NuStep L6 x Leg press DL 30# Q65 78# pulley --> rows 3x10 Resisted trunk rotation --> 5# pulley x10 (B) (R) shoulder flexion slides up wall --> finger ladder (shaking at #15) L stretch at counter Standing hip abd  + 4#AW Standing marching + 4#AW LTR x10 STS x10 -->  seated edge of corner x15 Standing 1/2 tandem + slow head turns   [facility] Adult PT Treatment:                                                DATE: 05/05/2022 Therapeutic Exercise: NuStep L6 x + fall prevention at home review (handout) Standing in corner with chair in front: Marching (support prn) + 3#AW 2x20 Standing squats 2x10 Walking + bilateral purse carries 5#Kbs Blue foam beam: Lateral weight shifting Side stepping  NBOS walking --> marching (fwd) STS + cradling 5#DB 2x10   [facility] Adult PT Treatment:                                                DATE: 03/23/2023 Therapeutic Exercise: NuStep L6 x STS + thoracic extension in standing 2x10 Marching + 3#AW 2x20' Standing:  Hip abd & ext + 3#AW 2x10 each Heel raises 3x10 Runner's lunge stretch 2x30" SLB + UE support 2x30" 6" step up/down 3x1' Therapeutic Activity: Squat cone pick-up from floor Staggered stance +  trunk rotation with 10" hold each way Seated dead lift --> added 10#KB  PATIENT EDUCATION:  Education details: HEP update  Person educated: Patient Education method: Explanation, Demonstration, Tactile cues, Verbal cues, and Handouts Education comprehension: verbalized understanding, returned demonstration, verbal cues required, tactile cues required, and needs further education  HOME EXERCISE PROGRAM: Access Code: [email or web address] URL: [email or web address] Date: 03/15/2023 Prepared by: Letitia Libra  Exercises - Supine Lower Trunk Rotation with PLB  - 1 x daily - 7 x weekly - 3 sets - 10 reps - Seated Long Arc Quad  - 1 x daily - 7 x weekly - 2 sets - 10 reps - Seated March with Resistance  - 1 x daily - 7 x weekly - 2 sets - 10 reps - Seated Hip Abduction with Resistance  - 1 x daily - 7 x weekly - 2 sets - 10 reps - Sit to Stand with Armchair  - 1 x daily - 7 x weekly - 2 sets - 10 reps - Seated Scapular Retraction  - 1 x daily - 7 x weekly - 3 sets - 10 reps - Seated Thoracic Self-Mobilization  - 1 x daily - 7 x weekly - 3 sets - 10 reps - 3 sec hold - Standing Bicep Curls Supinated with Dumbbells  - 1 x daily - 7 x weekly - 3 sets - 10 reps - Side Stepping with Counter Support  - 1 x daily - 7 x weekly - 2 sets - 10 reps - Standing Hip Abduction with Counter Support  - 1 x daily - 7 x weekly - 2 sets - 10 reps - Standing Knee Flexion AROM with Chair Support  - 1 x daily - 7 x weekly - 2 sets - 10 reps - Standing Heel Raises  - 1 x daily - 7 x weekly - 2 sets - 10 reps - Standing Romberg to 1/2 Tandem Stance  - 1 x daily - 7 x weekly - 3 sets - 30 sec  hold   ASSESSMENT:  CLINICAL IMPRESSION: Leg press and pulley machines incorporated to progress patient towards return to gym. Limited shoulder flexion noted on R;  no pain reported with R shoulder flexion wall slides, however patient reported pain with finger ladder.    OBJECTIVE IMPAIRMENTS: Abnormal gait, decreased  activity tolerance, decreased balance, decreased endurance, decreased knowledge of condition, decreased knowledge of use of DME, decreased mobility, difficulty walking, decreased ROM, decreased strength, decreased safety awareness, impaired flexibility, improper body mechanics, postural dysfunction, and pain.   ACTIVITY LIMITATIONS: carrying, lifting, bending, standing, squatting, stairs, transfers, and locomotion level  PARTICIPATION LIMITATIONS: meal prep, cleaning, laundry, shopping, community activity, and yard work  PERSONAL FACTORS: Age, Fitness, Time since onset of injury/illness/exacerbation, and 3+ comorbidities: see PMH/PSH above  are also affecting patient's functional outcome.   REHAB POTENTIAL: Fair see personal factors  CLINICAL DECISION MAKING: Evolving/moderate complexity  EVALUATION COMPLEXITY: Moderate   GOALS: Goals reviewed with patient? Yes  SHORT TERM GOALS: Target date: 03/24/2023  Patient will be independent and compliant with initial HEP.  Baseline: Goal status: ONGOING  2.  Patient will complete at least 7 reps during 30 second chair rise to improve her functional strength.  Baseline: see above  Goal status: MET  3.  Patient will complete partial range SL calf raise to improve push-off during gait cycle.  Baseline: see above  Goal status: PROGRESSING   4.  Patient will complete TUG in </=18 seconds to reduce her risk of falls.  Baseline: see above  Goal status: MET   LONG TERM GOALS: Target date: 05/07/23  Patient will score at least 53% self confidence on the ABC scale.  Baseline: 43% Goal status: INITIAL  2.  Patient will be independent with floor transfers. Baseline: requires assistance.  Goal status: INITIAL  3.  Patient will be modified independent with curb negotiation.  Baseline: recent fall from tripping on curb  Goal status: INITIAL  4.  Patient will demonstrate at least 4/5 bilateral hip strength to improve stability about the  chain with walking/standing activity.  Baseline: see above Goal status: INITIAL  5.  Patient will demonstrate 5/5 bilateral knee strength to improve stability with stair/curb negotiation.  Baseline: see above  Goal status: INITIAL  6.  Patient will score at least 45/56 on BERG balance to reduce her risk of falls.  Baseline: see above  Goal status: INITIAL   PLAN:  PT FREQUENCY: 2x/week  PT DURATION: 12 weeks  PLANNED INTERVENTIONS: 97164- PT Re-evaluation, 97110-Therapeutic exercises, 97530- Therapeutic activity, 97112- Neuromuscular re-education, 97535- Self Care, 29562- Manual therapy, 97116- Gait training, Balance training, Stair training, Taping, Dry Needling, DME instructions, Cryotherapy, and Moist heat.  PLAN FOR NEXT SESSION: Generalized lower extremity strengthening (STANDING), static balance; posture education, spinal mobility, gait training.   Carlynn Herald, PTA 04/12/23 9:24 AM

## 2023-04-13 DIAGNOSIS — M25511 Pain in right shoulder: Secondary | ICD-10-CM | POA: Diagnosis not present

## 2023-04-13 DIAGNOSIS — M19011 Primary osteoarthritis, right shoulder: Secondary | ICD-10-CM | POA: Diagnosis not present

## 2023-04-14 ENCOUNTER — Ambulatory Visit (INDEPENDENT_AMBULATORY_CARE_PROVIDER_SITE_OTHER): Payer: 59 | Admitting: Podiatry

## 2023-04-14 DIAGNOSIS — Z91199 Patient's noncompliance with other medical treatment and regimen due to unspecified reason: Secondary | ICD-10-CM

## 2023-04-14 NOTE — Progress Notes (Signed)
No show

## 2023-04-22 ENCOUNTER — Ambulatory Visit: Payer: Self-pay | Admitting: Family Medicine

## 2023-04-22 NOTE — Telephone Encounter (Signed)
Spoke with patient. She refused to see any other provider in our office but Dr. Linford Arnold She states if symptoms do worsen she will go to either urgent care or the ER.

## 2023-04-22 NOTE — Telephone Encounter (Signed)
  Chief Complaint: nausea  Symptoms: nausea, vomiting episode once, abdominal pain  Frequency: since this morning  Pertinent Negatives: Patient denies chest pain, SOB, fever, diarrhea, headache, blood or coffee ground emesis, head injury, abdominal swelling, constipation  Disposition: [] ED /[x] Urgent Care (no appt availability in office) / [] Appointment(In office/virtual)/ []  [place] [facility]/ [] Home Care/ [] Refused Recommended Disposition /[] [place] [facility]/ []  Follow-up with PCP Additional Notes: Pt with nausea and one vomiting episode this morning. Pt states abdominal pain that started today. No available appts at pt's PCP, pt refusing appt available this afternoon at other PCP office. Pt refusing urgent care, states she will go if she gets worse or vomits anymore. Pt verbalizes understanding of concerning symptoms to go to UC or ED for and when to call back.  Copied from CRM [id number]. Topic: Clinical - Red Word Triage >> Apr 22, 2023  1:08 PM Gaetano Hawthorne wrote: Red Word that prompted transfer to Nurse Triage: Patient has been taking medication - Sucralfate and it has been causing her some nausea - she mentioned that she threw up this morning. Reason for Disposition  [1] Constant abdominal pain AND [2] present > 2 hours  Answer Assessment - Initial Assessment Questions 1. VOMITING SEVERITY: "How many times have you vomited in the past 24 hours?"     - MILD:  1 - 2 times/day    - MODERATE: 3 - 5 times/day, decreased oral intake without significant weight loss or symptoms of dehydration    - SEVERE: 6 or more times/day, vomits everything or nearly everything, with significant weight loss, symptoms of dehydration      Once this AM.  2. ONSET: "When did the vomiting begin?"      This morning around 10:50 am.  3. FLUIDS: "What fluids or food have you vomited up today?" "Have you been able to keep any fluids down?"     Pt states she thinks it was just liquids that came up, didn't  see any food in her vomit. Pt states she has been keeping fluids down, but feels nauseated like she will throw up again.   4. ABDOMEN PAIN: "Are your having any abdomen pain?" If Yes : "How bad is it and what does it feel like?" (e.g., crampy, dull, intermittent, constant)      8/10 abdominal pain center, "its pain that going on" pt unable to describe.  5. DIARRHEA: "Is there any diarrhea?" If Yes, ask: "How many times today?"      Denies.  6. CONTACTS: "Is there anyone else in the family with the same symptoms?"      Pt states she lives alone.  7. CAUSE: "What do you think is causing your vomiting?"     Pt states she felt funny this morning when she woke up so she tried eating something, then the nausea started.   8. HYDRATION STATUS: "Any signs of dehydration?" (e.g., dry mouth [not only dry lips], too weak to stand) "When did you last urinate?"     Pt states she has been urinating and it is yellow and clear. Denies dry mouth.  9. OTHER SYMPTOMS: "Do you have any other symptoms?" (e.g., fever, headache, vertigo, vomiting blood or coffee grounds, recent head injury)     Denies additional symptoms.  Protocols used: Vomiting-A-AH

## 2023-04-24 NOTE — Telephone Encounter (Signed)
Ok ot put in an acute slot

## 2023-04-26 ENCOUNTER — Ambulatory Visit: Payer: Self-pay | Admitting: Family Medicine

## 2023-04-26 ENCOUNTER — Ambulatory Visit: Payer: 59

## 2023-04-26 DIAGNOSIS — R293 Abnormal posture: Secondary | ICD-10-CM

## 2023-04-26 DIAGNOSIS — M5459 Other low back pain: Secondary | ICD-10-CM

## 2023-04-26 DIAGNOSIS — R296 Repeated falls: Secondary | ICD-10-CM

## 2023-04-26 DIAGNOSIS — M6281 Muscle weakness (generalized): Secondary | ICD-10-CM

## 2023-04-26 DIAGNOSIS — R2681 Unsteadiness on feet: Secondary | ICD-10-CM

## 2023-04-26 NOTE — Telephone Encounter (Addendum)
 Copied from CRM [id number]. Topic: Clinical - Pink Word Triage >> Apr 26, 2023  2:40 PM Franky GRADE wrote: Reason for Triage: Patient is calling because her sugar level is at 306 and she is not due to take her ozempic  until tomorrow. She is not experiencing any other symptoms.  >> Apr 26, 2023  2:43 PM Franky GRADE wrote: Patient did want to mention she ate a sausage egg and cheese with jelly.   2nd attempt to call patient with no answer. Unable to leave voicemail.    Chief Complaint: High blood sugar Symptoms: Patient denies any symptoms Frequency: Single check Pertinent Negatives: Patient denies any current symptoms Disposition: [] ED /[] Urgent Care (no appt availability in office) / [] Appointment(In office/virtual)/ []  [place] [facility]/ [x] Home Care/ [] Refused Recommended Disposition /[] [place] [facility]/ []  Follow-up with PCP Additional Notes: Patient reports she checked her blood sugar and it was 306. She denies any symptoms at this time. She reports that she is supposed to take her Ozempic  shot tomorrow, but took it today prior to my calling. She states that she is waiting an hour after taking to to check her blood sugar again. Patient instructed to increase fluid intake and to recheck her blood sugar as planned. She was advised that if her blood sugar stays elevated or if she becomes symptomatic to call back or follow up with urgent care due to offices being closed for the holidays. She understood and is agreeable with this plan.

## 2023-04-26 NOTE — Telephone Encounter (Signed)
 Copied from CRM [id number]. Topic: Clinical - Pink Word Triage >> Apr 26, 2023  2:40 PM Franky GRADE wrote: Reason for Triage: Patient is calling because her sugar level is at 306 and she is not due to take her ozempic  until tomorrow. She is not experiencing any other symptoms.  >> Apr 26, 2023  2:43 PM Franky GRADE wrote: Patient did want to mention she ate a sausage egg and cheese with jelly.    Called patient but no answer. Not able to leave voicemail.

## 2023-04-26 NOTE — Therapy (Signed)
 OUTPATIENT PHYSICAL THERAPY TREATMENT  Progress Note  Reporting Period 02/10/2023 to 04/26/2023  See note below for Objective Data and Assessment of Progress/Goals.    Patient Name: Amanda Morris MRN: [id number] DOB:02/14/1951, 72 y.o., female Today's Date: 04/26/2023  END OF SESSION:  PT End of Session - 04/26/23 0832     Visit Number 10    Number of Visits 25    Date for PT Re-Evaluation 05/07/23    Authorization Type U[employer] MCR/MCD    Progress Note Due on Visit 10    PT Start Time [phone]    PT Stop Time 0916    PT Time Calculation (min) 43 min    Activity Tolerance Patient tolerated treatment well    Behavior During Therapy WFL for tasks assessed/performed             Past Medical History:  Diagnosis Date   Allergy    Diabetes mellitus without complication (HCC)    type 2   GERD (gastroesophageal reflux disease)    Headache    History of hiatal hernia    Hypercholesterolemia    Numbness    left, outer thigh   Palpitations    in the past, no current issues per patient   Postlaminectomy syndrome    Past Surgical History:  Procedure Laterality Date   ABDOMINAL HYSTERECTOMY  1982   BACK SURGERY     x 2    BREAST REDUCTION SURGERY Bilateral 03/17/2016   Procedure: MAMMARY REDUCTION  (BREAST);  Surgeon: Estefana GORMAN Fritter, DO;  Location: [facility];  Service: Plastics;  Laterality: Bilateral;   BREAST SURGERY     COLONOSCOPY     LUMBAR WOUND DEBRIDEMENT N/A 05/22/2020   Procedure: Incision and drainage of lumbar wound;  Surgeon: Gillie Duncans, MD;  Location: [facility] OR;  Service: Neurosurgery;  Laterality: N/A;   SHOULDER SURGERY  06/2008, 09/2010   Right, by Dr. Lorence, then Dr. Wilbern   SPINAL CORD STIMULATOR INSERTION N/A 12/02/2017   Procedure: LUMBAR SPINAL CORD STIMULATOR INSERTION;  Surgeon: Gillie Duncans, MD;  Location: [facility] OR;  Service: Neurosurgery;  Laterality: N/A;   SPINAL CORD STIMULATOR TRIAL N/A 11/25/2017   Procedure:  INSERTION LUMBAR SPINAL CORD STIMULATOR TRIAL  ;  Surgeon: Gillie Duncans, MD;  Location[facility] OR;  Service: Neurosurgery;  Laterality: N/A;   INSERTION LUMBAR SPINAL CORD STIMULATOR TRIAL     UPPER GI ENDOSCOPY     Patient Active Problem List   Diagnosis Date Noted   Osteopenia after menopause 02/18/2023   Frequent falls 02/10/2023   Screening for osteoporosis 02/08/2023   Lung nodule 12/24/2022   Atherosclerosis of aorta (HCC) 11/02/2022   Right flank pain 08/17/2022   Left hip pain 02/23/2021   Low back pain 02/23/2021   Gait abnormality 02/23/2021   Bilateral lower extremity edema 01/22/2021   Elevated CK 10/12/2020   Spinal stenosis 10/09/2020   Lumbar adjacent segment disease with spondylolisthesis 10/09/2020   Spinal stenosis, lumbar region with neurogenic claudication 04/16/2020   Peripheral neuropathy 11/28/2019   Paresthesia and pain of both upper extremities 11/12/2019   Chronic midline low back pain with bilateral sciatica 10/30/2019   Left thigh pain 10/30/2019   Meralgia paresthetica, left 09/12/2019   Everitt Quervain's disease (tenosynovitis) 02/06/2019   Acute bilateral low back pain without sciatica 12/01/2018   Failed back syndrome of lumbar spine 11/25/2017   Inflammation of sacroiliac joint (HCC) 06/07/2017   Displacement of lumbar intervertebral disc 11/22/2016   Fatty liver 09/27/2016  Status post bilateral breast reduction 03/23/2016   Hypertrophy of breast 08/12/2015   Spondylolisthesis of lumbar region 06/13/2015   History of lumbar fusion 04/07/2015   Radiculopathy, lumbar region 04/07/2015   Essential hypertension 02/14/2015   Pelvic pain 12/02/2014   Postprandial vomiting 02/04/2014   Obesity (BMI 30-39.9) 11/01/2013   Carotid artery stenosis 06/07/2013   Hereditary and idiopathic peripheral neuropathy 06/07/2013   History of migraine 05/04/2013   Type 2 diabetes mellitus with other specified complication (HCC) 05/05/2011   SVT (supraventricular  tachycardia) (HCC) 11/03/2010   GERD (gastroesophageal reflux disease) 10/29/2010   Chronic low back pain 10/29/2010   Hyperlipidemia associated with type 2 diabetes mellitus (HCC) 10/29/2010    PCP: Alvan Dorothyann BIRCH, MD  REFERRING PROVIDER:  Booker Darice SAUNDERS, FNP (encounter for medicare annual wellness exam)   Alvan Dorothyann BIRCH, MD (other diagnoses)   REFERRING DIAG:  Z00.00 (ICD-10-CM) - Encounter for Medicare annual wellness exam   R07.9 (ICD-10-CM) - Right-sided chest pain  M89.8X1 (ICD-10-CM) - Clavicle pain  M54.50 (ICD-10-CM) - Acute bilateral low back pain without sciatica  S20.01XA (ICD-10-CM) - Contusion of right breast, initial encounter   Rationale for Evaluation and Treatment: Rehabilitation  THERAPY DIAG:  Other low back pain  Unsteadiness on feet  Repeated falls  Muscle weakness (generalized)  Abnormal posture  ONSET DATE: chronic    SUBJECTIVE:                                                                                                                                                                                           SUBJECTIVE STATEMENT: Patient reports she has been ill over the last few days but is feeling better; states her balance is feeling more steady and she has been doing her exercises.    PERTINENT HISTORY:  Back surgery x 2 Breast surgery  Lumbar wound debridement  Rt shoulder surgery x 2  Spinal cord stimulator   PAIN:  Are you having pain? Yes: NPRS scale: none currently; at worst 8/10 Pain location: low back/posterior hips Pain description: sore Aggravating factors: household activity Relieving factors: rest  PRECAUTIONS:  Fall  RED FLAGS: None   WEIGHT BEARING RESTRICTIONS:  No  FALLS:  Has patient fallen in last 6 months? Yes. Number of falls 4; see subjective  LIVING ENVIRONMENT: Lives with: lives alone Lives in: House/apartment Stairs: No Has following equipment at home: Single point cane,  Environmental Consultant - 2 wheeled, shower chair, and Grab bars  OCCUPATION:  Retired   PLOF:  Independent  PATIENT GOALS:  Be able to do things much better and work around the house. No more falls.  NEXT MD VISIT: March 2025   OBJECTIVE:  Note: Objective measures were completed at Evaluation unless otherwise noted.  DIAGNOSTIC FINDINGS:  No recent imaging on file   PATIENT SURVEYS:  ABC scale 43% self-confidence   COGNITIVE STATUS: Within functional limits for tasks assessed   SENSATION: WFL   POSTURE:  rounded shoulders, forward head, and increased thoracic kyphosis   GAIT: Distance walked: 10 ft  Assistive device utilized: Single point cane Level of assistance: Modified independence Comments: decreased step length; limited push-off, narrow base; no arm swing, forward flexed posture    Body Part #1 Lumbar   LUMBAR ROM:   Active  A/PROM  eval  Flexion 50% limited  Extension 75% limited   Right lateral flexion WFL  Left lateral flexion WFL  Right rotation 50% limited  Left rotation 50% limited   (Blank rows = not tested); pain with all lumbar AROM   LOWER EXTREMITY MMT:    MMT Right eval Left eval 03/15/23 04/26/23 R/L  Hip flexion 3+ 3+  4/4  Hip extension      Hip abduction 3+ 3+  4/4-  Hip adduction      Hip internal rotation      Hip external rotation      Knee flexion 4 4  4+/4  Knee extension 4 4  4+/4  Ankle dorsiflexion 5 5    Ankle plantarflexion Partial range DL calf raise Partial range DL calf raise Minimal range SL calf raise bilaterally  Minimal range SL calf raise bilaterally   Ankle inversion      Ankle eversion 5 5     (Blank rows = not tested)    SPECIAL TESTS:  SLR (+)   FUNCTIONAL TESTS:  30 second chair test: 4 reps TUG: 24 seconds  03/15/23:  30 second chair test: 10 reps  TUG: 17.3 seconds      BERG BALANCE TEST Sitting to Standing: 4.      Stands without using hands and stabilize independently Standing Unsupported: 4.       Stands safely for 2 minutes Sitting Unsupported: 4.     Sits for 2 minutes independently Standing to Sitting: 3.     Controls descent with hands  Transfers: 3.     Transfers safely definite use of hands Standing with eyes closed: 3.     Stands 10 seconds with supervision Standing with feet together: 4.     Stands for 1 minute safely Reaching forward with outstretched arm: 3.     Reaches forward 5 inches Retrieving object from the floor: 3.     Able to pick up with supervision Turning to look behind: 3.     Looks behind one side only, other side less weight shift Turning 360 degrees: 2.     Able to turn slowly, but safely Place alternate foot on stool: 3.     Completes 8 steps in >20 seconds Standing with one foot in front: 2.     Independent small step for 30 seconds Standing on one foot: 1.     Holds <3 seconds  Total Score: 39/56   [facility] Adult PT Treatment:                                                DATE: 04/26/2023 Therapeutic Exercise: NuStep L6 x Hip/knee MMT Therapeutic Activity: Updating  LTGs    [facility] Adult PT Treatment:                                                DATE: 04/12/2023 Therapeutic Exercise: NuStep L6 x Leg press DL 34# k79 89# pulley --> rows 3x10 Resisted trunk rotation --> 5# pulley x10 (B) (R) shoulder flexion slides up wall --> finger ladder (shaking at #15) L stretch at counter Standing hip abd  + 4#AW Standing marching + 4#AW LTR x10 STS x10 --> seated edge of corner x15 Standing 1/2 tandem + slow head turns   [facility] Adult PT Treatment:                                                DATE: 05/05/2022 Therapeutic Exercise: NuStep L6 x + fall prevention at home review (handout) Standing in corner with chair in front: Marching (support prn) + 3#AW 2x20 Standing squats 2x10 Walking + bilateral purse carries 5#Kbs Blue foam beam: Lateral weight shifting Side stepping  NBOS walking --> marching (fwd) STS + cradling 5#DB  2x10   PATIENT EDUCATION:  Education details: HEP update  Person educated: Patient Education method: Explanation, Demonstration, Tactile cues, Verbal cues, and Handouts Education comprehension: verbalized understanding, returned demonstration, verbal cues required, tactile cues required, and needs further education  HOME EXERCISE PROGRAM: Access Code: [email or web address] URL: [email or web address] Date: 03/15/2023 Prepared by: Lucie Meeter  Exercises - Supine Lower Trunk Rotation with PLB  - 1 x daily - 7 x weekly - 3 sets - 10 reps - Seated Long Arc Quad  - 1 x daily - 7 x weekly - 2 sets - 10 reps - Seated March with Resistance  - 1 x daily - 7 x weekly - 2 sets - 10 reps - Seated Hip Abduction with Resistance  - 1 x daily - 7 x weekly - 2 sets - 10 reps - Sit to Stand with Armchair  - 1 x daily - 7 x weekly - 2 sets - 10 reps - Seated Scapular Retraction  - 1 x daily - 7 x weekly - 3 sets - 10 reps - Seated Thoracic Self-Mobilization  - 1 x daily - 7 x weekly - 3 sets - 10 reps - 3 sec hold - Standing Bicep Curls Supinated with Dumbbells  - 1 x daily - 7 x weekly - 3 sets - 10 reps - Side Stepping with Counter Support  - 1 x daily - 7 x weekly - 2 sets - 10 reps - Standing Hip Abduction with Counter Support  - 1 x daily - 7 x weekly - 2 sets - 10 reps - Standing Knee Flexion AROM with Chair Support  - 1 x daily - 7 x weekly - 2 sets - 10 reps - Standing Heel Raises  - 1 x daily - 7 x weekly - 2 sets - 10 reps - Standing Romberg to 1/2 Tandem Stance  - 1 x daily - 7 x weekly - 3 sets - 30 sec  hold   ASSESSMENT:  CLINICAL IMPRESSION: No significant change to balance as measured by Vivienne balance scale. Patient requires UE support from table during transfer from floor to standing; patient attempted transfer independently,  however required contact guard assist due to LOB in standing. Curb training performed with SPC due to unsteadiness and patient's fear of falling. Mild  improvement exhibited with bilateral hip strength (R>L). Patient will continues to benefit from skilled therapy to address ongoing balance deficits, strength and improved safety for fall prevention and recovery.   OBJECTIVE IMPAIRMENTS: Abnormal gait, decreased activity tolerance, decreased balance, decreased endurance, decreased knowledge of condition, decreased knowledge of use of DME, decreased mobility, difficulty walking, decreased ROM, decreased strength, decreased safety awareness, impaired flexibility, improper body mechanics, postural dysfunction, and pain.   ACTIVITY LIMITATIONS: carrying, lifting, bending, standing, squatting, stairs, transfers, and locomotion level  PARTICIPATION LIMITATIONS: meal prep, cleaning, laundry, shopping, community activity, and yard work  PERSONAL FACTORS: Age, Fitness, Time since onset of injury/illness/exacerbation, and 3+ comorbidities: see PMH/PSH above  are also affecting patient's functional outcome.   REHAB POTENTIAL: Fair see personal factors  CLINICAL DECISION MAKING: Evolving/moderate complexity  EVALUATION COMPLEXITY: Moderate   GOALS: Goals reviewed with patient? Yes  SHORT TERM GOALS: Target date: 03/24/2023  Patient will be independent and compliant with initial HEP.  Baseline: Goal status: ONGOING  2.  Patient will complete at least 7 reps during 30 second chair rise to improve her functional strength.  Baseline: see above  Goal status: MET  3.  Patient will complete partial range SL calf raise to improve push-off during gait cycle.  Baseline: see above  Goal status: PROGRESSING   4.  Patient will complete TUG in </=18 seconds to reduce her risk of falls.  Baseline: see above  Goal status: MET   LONG TERM GOALS: Target date: 05/07/23  Patient will score at least 53% self confidence on the ABC scale.  Baseline: 41.9% Goal status: REGRESSION  2.  Patient will be independent with floor transfers. Baseline: assist from  table to stand Goal status: IN PROGRESS  3.  Patient will be modified independent with curb negotiation.  Baseline: SPC Goal status: MET  4.  Patient will demonstrate at least 4/5 bilateral hip strength to improve stability about the chain with walking/standing activity.  Baseline: see above Goal status: MET  5.  Patient will demonstrate 5/5 bilateral knee strength to improve stability with stair/curb negotiation.  Baseline: see above  Goal status: ONGOING  6.  Patient will score at least 45/56 on BERG balance to reduce her risk of falls.  Baseline: 39/56  Goal status: ONGOING   PLAN:  PT FREQUENCY: 2x/week  PT DURATION: 12 weeks  PLANNED INTERVENTIONS: 97164- PT Re-evaluation, 97110-Therapeutic exercises, 97530- Therapeutic activity, 97112- Neuromuscular re-education, 97535- Self Care, 02859- Manual therapy, 97116- Gait training, Balance training, Stair training, Taping, Dry Needling, DME instructions, Cryotherapy, and Moist heat.  PLAN FOR NEXT SESSION: Generalized lower extremity strengthening (STANDING), static balance; posture education, spinal mobility, gait training.   Lamarr Price, PTA 04/26/23 9:17 AM

## 2023-04-26 NOTE — Telephone Encounter (Signed)
 Reason for Disposition . [1] Blood glucose > 300 mg/dL (83.2 mmol/L) AND [7] does not  use insulin  (e.g., not insulin -dependent; most people with type 2 diabetes)  Answer Assessment - Initial Assessment Questions 1. BLOOD GLUCOSE: What is your blood glucose level?      306 2. ONSET: When did you check the blood glucose?      2 hours ago 3. USUAL RANGE: What is your glucose level usually? (e.g., usual fasting morning value, usual evening value)     90-120 4. KETONES: Do you check for ketones (urine or blood test strips)? If Yes, ask: What does the test show now?      No 5. TYPE 1 or 2:  Do you know what type of diabetes you have?  (e.g., Type 1, Type 2, Gestational; doesn't know)      Type 2 6. INSULIN : Do you take insulin ? What type of insulin (s) do you use? What is the mode of delivery? (syringe, pen; injection or pump)?      No 7. DIABETES PILLS: Do you take any pills for your diabetes? If Yes, ask: Have you missed taking any pills recently?     Takes Ozempic , took it immediately prior to call  8. OTHER SYMPTOMS: Do you have any symptoms? (e.g., fever, frequent urination, difficulty breathing, dizziness, weakness, vomiting)     No 9. PREGNANCY: Is there any chance you are pregnant? When was your last menstrual period?     No  Protocols used: Diabetes - High Blood Sugar-A-AH

## 2023-04-28 ENCOUNTER — Ambulatory Visit: Payer: 59

## 2023-04-28 ENCOUNTER — Encounter: Payer: Self-pay | Admitting: Family Medicine

## 2023-04-28 ENCOUNTER — Ambulatory Visit (INDEPENDENT_AMBULATORY_CARE_PROVIDER_SITE_OTHER): Payer: 59 | Admitting: Family Medicine

## 2023-04-28 VITALS — BP 104/74 | HR 93 | Ht 64.0 in | Wt 176.0 lb

## 2023-04-28 DIAGNOSIS — R1110 Vomiting, unspecified: Secondary | ICD-10-CM

## 2023-04-28 DIAGNOSIS — K2100 Gastro-esophageal reflux disease with esophagitis, without bleeding: Secondary | ICD-10-CM | POA: Diagnosis not present

## 2023-04-28 DIAGNOSIS — R112 Nausea with vomiting, unspecified: Secondary | ICD-10-CM | POA: Diagnosis not present

## 2023-04-28 DIAGNOSIS — E870 Hyperosmolality and hypernatremia: Secondary | ICD-10-CM

## 2023-04-28 DIAGNOSIS — K589 Irritable bowel syndrome without diarrhea: Secondary | ICD-10-CM | POA: Insufficient documentation

## 2023-04-28 DIAGNOSIS — K449 Diaphragmatic hernia without obstruction or gangrene: Secondary | ICD-10-CM | POA: Insufficient documentation

## 2023-04-28 MED ORDER — ONDANSETRON HCL 4 MG PO TABS: 4.0000 mg | ORAL_TABLET | Freq: Three times a day (TID) | ORAL | 1 refills | Status: DC | PRN

## 2023-04-28 NOTE — Progress Notes (Signed)
 Established Patient Office Visit  Subjective  Patient ID: Amanda Morris, female    DOB: 1951/04/14  Age: 73 y.o. MRN: [id number]  Chief Complaint  Patient presents with   Nausea        Emesis    HPI Here today for vomiting and diarrhea.  She said she had 24 hours of pretty intense symptoms even vomited her pills back up.  She said it was on and off for about a week but she does feel like she is feeling better and the diarrhea has stopped and she has had normal stools appetite is getting a little better but not completely back to normal and she has not vomited but she is still having some intermittent nausea.  No known sick contacts.  She has not made any changes to her medications.  Was like the Carafate  is really not helpful.  Some she saw GI, Dr. Kennieth was in September she has a history of IBS GERD and esophagitis, hiatal hernia and gastritis.  She is currently on Dexilant and doxepin .  Does seem to help with her IBS.  And she uses Anusol for her hemorrhoids.  Plan to refer her for possible hemorrhoid banding.    ROS    Objective:     BP 104/74   Pulse 93   Ht 5' 4 (1.626 m)   Wt 176 lb (79.8 kg)   LMP  (LMP Unknown)   SpO2 98%   BMI 30.21 kg/m    Physical Exam Vitals and nursing note reviewed.  Constitutional:      Appearance: Normal appearance.  HENT:     Head: Normocephalic and atraumatic.  Eyes:     Conjunctiva/sclera: Conjunctivae normal.  Cardiovascular:     Rate and Rhythm: Normal rate and regular rhythm.  Pulmonary:     Effort: Pulmonary effort is normal.     Breath sounds: Normal breath sounds.  Skin:    General: Skin is warm and dry.  Neurological:     Mental Status: She is alert.  Psychiatric:        Mood and Affect: Mood normal.      No results found for any visits on 04/28/23.    The ASCVD Risk score (Arnett DK, et al., 2019) failed to calculate for the following reasons:   The valid total cholesterol range is 130 to 320  mg/dL    Assessment & Plan:   Problem List Items Addressed This Visit       Respiratory   Hiatal hernia     Digestive   Postprandial vomiting - Primary   Relevant Orders   CMP14+EGFR   Lipase   CBC with Differential/Platelet   IBS (irritable bowel syndrome)   Relevant Medications   ondansetron  (ZOFRAN ) 4 MG tablet   GERD (gastroesophageal reflux disease)   Continue with Dexilant.      Relevant Medications   ondansetron  (ZOFRAN ) 4 MG tablet   Other Visit Diagnoses       Nausea and vomiting, unspecified vomiting type       Relevant Orders   CMP14+EGFR   Lipase   CBC with Differential/Platelet     Hypernatremia       Relevant Orders   CMP14+EGFR   Lipase   CBC with Differential/Platelet      Sounds likely gastroenteritis - likely viral.  Sxs seem much improved. Will check liver and pancreas enzymes. Will check CBC. Sent zofran  prn.    She does not feel like it is related  to the semaglutide  which she has been on for quite some time.  Return in about 2 months (around 06/26/2023) for Diabetes follow-up.    Dorothyann Byars, MD

## 2023-04-28 NOTE — Patient Instructions (Signed)
 Please give her the phone number for Dr. Tonna Boehringer office to call we had placed a referral and she needs to get scheduled for an appointment.

## 2023-04-28 NOTE — Assessment & Plan Note (Signed)
 Continue with Dexilant

## 2023-04-28 NOTE — Progress Notes (Signed)
 Pt reports that she last vomited last week, and she experiences nausea sometime.  She denies any f/s/c diarrhea. She said that she belches and it comes up into her throat.   She continues to take pepcid,dexilant,zofran and sucralfate.

## 2023-04-29 LAB — LIPASE: Lipase: 28 U/L (ref 14–72)

## 2023-04-29 LAB — CMP14+EGFR
ALT: 19 [IU]/L (ref 0–32)
AST: 23 [IU]/L (ref 0–40)
Albumin: 3.7 g/dL — ABNORMAL LOW (ref 3.9–4.9)
Alkaline Phosphatase: 98 [IU]/L (ref 44–121)
BUN/Creatinine Ratio: 6 — ABNORMAL LOW (ref 12–28)
BUN: 5 mg/dL — ABNORMAL LOW (ref 8–27)
Bilirubin Total: 0.2 mg/dL (ref 0.0–1.2)
CO2: 28 mmol/L (ref 20–29)
Calcium: 8.6 mg/dL — ABNORMAL LOW (ref 8.7–10.3)
Chloride: 103 mmol/L (ref 96–106)
Creatinine, Ser: 0.77 mg/dL (ref 0.57–1.00)
Globulin, Total: 2.5 g/dL (ref 1.5–4.5)
Glucose: 88 mg/dL (ref 70–99)
Potassium: 3.1 mmol/L — ABNORMAL LOW (ref 3.5–5.2)
Sodium: 145 mmol/L — ABNORMAL HIGH (ref 134–144)
Total Protein: 6.2 g/dL (ref 6.0–8.5)
eGFR: 83 mL/min/{1.73_m2} (ref >59)

## 2023-04-29 LAB — CBC WITH DIFFERENTIAL/PLATELET
Basophils Absolute: 0.1 10*3/uL (ref 0.0–0.2)
Basos: 1 %
EOS (ABSOLUTE): 0.1 10*3/uL (ref 0.0–0.4)
Eos: 1 %
Hematocrit: 42.7 % (ref 34.0–46.6)
Hemoglobin: 13.2 g/dL (ref 11.1–15.9)
Immature Grans (Abs): 0 10*3/uL (ref 0.0–0.1)
Immature Granulocytes: 0 %
Lymphocytes Absolute: 3.3 10*3/uL — ABNORMAL HIGH (ref 0.7–3.1)
Lymphs: 34 %
MCH: 26.4 pg — ABNORMAL LOW (ref 26.6–33.0)
MCHC: 30.9 g/dL — ABNORMAL LOW (ref 31.5–35.7)
MCV: 85 fL (ref 79–97)
Monocytes Absolute: 1 10*3/uL — ABNORMAL HIGH (ref 0.1–0.9)
Monocytes: 11 %
Neutrophils Absolute: 5.4 10*3/uL (ref 1.4–7.0)
Neutrophils: 53 %
Platelets: 260 10*3/uL (ref 150–450)
RBC: 5 x10E6/uL (ref 3.77–5.28)
RDW: 14.6 % (ref 11.7–15.4)
WBC: 9.9 10*3/uL (ref 3.4–10.8)

## 2023-04-29 NOTE — Progress Notes (Signed)
 Hi Amanda Morris, sodium level still just a little elevated.  Potassium is low again it was normal a month ago but now it is low again. Liver enzymes look good this time.  No anemia.  No sign of pancreatitis or hepatitis.

## 2023-05-02 NOTE — Progress Notes (Signed)
 Potassium rich foods: Bananas, oranges, cantaloupes, honeydew, apricots, grapefruit (some dried fruits, such as prunes, raisins, and dates, are also high in potassium) Cooked spinach Cooked broccoli Potatoes Sweet potatoes Mushrooms Peas Cucumbers Zucchini Pumpkins Leafy greens

## 2023-05-02 NOTE — Telephone Encounter (Signed)
 Pt was seen in office.

## 2023-05-05 ENCOUNTER — Ambulatory Visit (INDEPENDENT_AMBULATORY_CARE_PROVIDER_SITE_OTHER): Payer: 59 | Admitting: Family Medicine

## 2023-05-05 ENCOUNTER — Ambulatory Visit: Payer: 59 | Attending: Family Medicine

## 2023-05-05 ENCOUNTER — Encounter: Payer: Self-pay | Admitting: Family Medicine

## 2023-05-05 ENCOUNTER — Ambulatory Visit: Payer: 59

## 2023-05-05 VITALS — BP 98/67 | HR 106 | Temp 98.4°F | Ht 64.0 in | Wt 181.0 lb

## 2023-05-05 DIAGNOSIS — R2681 Unsteadiness on feet: Secondary | ICD-10-CM | POA: Insufficient documentation

## 2023-05-05 DIAGNOSIS — R1031 Right lower quadrant pain: Secondary | ICD-10-CM

## 2023-05-05 DIAGNOSIS — M6281 Muscle weakness (generalized): Secondary | ICD-10-CM | POA: Diagnosis not present

## 2023-05-05 DIAGNOSIS — R296 Repeated falls: Secondary | ICD-10-CM | POA: Insufficient documentation

## 2023-05-05 DIAGNOSIS — R21 Rash and other nonspecific skin eruption: Secondary | ICD-10-CM | POA: Diagnosis not present

## 2023-05-05 DIAGNOSIS — M5459 Other low back pain: Secondary | ICD-10-CM | POA: Diagnosis not present

## 2023-05-05 DIAGNOSIS — M25551 Pain in right hip: Secondary | ICD-10-CM

## 2023-05-05 DIAGNOSIS — M160 Bilateral primary osteoarthritis of hip: Secondary | ICD-10-CM | POA: Diagnosis not present

## 2023-05-05 DIAGNOSIS — R293 Abnormal posture: Secondary | ICD-10-CM | POA: Diagnosis not present

## 2023-05-05 MED ORDER — DOXYCYCLINE HYCLATE 100 MG PO TABS: 100.0000 mg | ORAL_TABLET | Freq: Two times a day (BID) | ORAL | 0 refills | Status: DC

## 2023-05-05 NOTE — Therapy (Signed)
 OUTPATIENT PHYSICAL THERAPY TREATMENT RE-CERTIFICATION      Patient Name: Amanda Morris MRN: [id number] DOB:June 11, 1950, 73 y.o., female Today's Date: 05/05/2023  END OF SESSION:  PT End of Session - 05/05/23 0801     Visit Number 11    Number of Visits 23    Date for PT Re-Evaluation 06/18/23    Authorization Type U[employer] MCR/MCD    Progress Note Due on Visit 20    PT Start Time 0801    PT Stop Time [phone]    PT Time Calculation (min) 42 min    Activity Tolerance Patient tolerated treatment well    Behavior During Therapy WFL for tasks assessed/performed              Past Medical History:  Diagnosis Date   Allergy    Diabetes mellitus without complication (HCC)    type 2   GERD (gastroesophageal reflux disease)    Headache    History of hiatal hernia    Hypercholesterolemia    Numbness    left, outer thigh   Palpitations    in the past, no current issues per patient   Postlaminectomy syndrome    Past Surgical History:  Procedure Laterality Date   ABDOMINAL HYSTERECTOMY  1982   BACK SURGERY     x 2    BREAST REDUCTION SURGERY Bilateral 03/17/2016   Procedure: MAMMARY REDUCTION  (BREAST);  Surgeon: Estefana GORMAN Fritter, DO;  Location: [facility];  Service: Plastics;  Laterality: Bilateral;   BREAST SURGERY     COLONOSCOPY     LUMBAR WOUND DEBRIDEMENT N/A 05/22/2020   Procedure: Incision and drainage of lumbar wound;  Surgeon: Gillie Duncans, MD;  Location: [facility] OR;  Service: Neurosurgery;  Laterality: N/A;   SHOULDER SURGERY  06/2008, 09/2010   Right, by Dr. Lorence, then Dr. Wilbern   SPINAL CORD STIMULATOR INSERTION N/A 12/02/2017   Procedure: LUMBAR SPINAL CORD STIMULATOR INSERTION;  Surgeon: Gillie Duncans, MD;  Location: [facility] OR;  Service: Neurosurgery;  Laterality: N/A;   SPINAL CORD STIMULATOR TRIAL N/A 11/25/2017   Procedure: INSERTION LUMBAR SPINAL CORD STIMULATOR TRIAL  ;  Surgeon: Gillie Duncans, MD;  Location: [facility] OR;  Service:  Neurosurgery;  Laterality: N/A;   INSERTION LUMBAR SPINAL CORD STIMULATOR TRIAL     UPPER GI ENDOSCOPY     Patient Active Problem List   Diagnosis Date Noted   IBS (irritable bowel syndrome) 04/28/2023   Hiatal hernia 04/28/2023   Osteopenia after menopause 02/18/2023   Frequent falls 02/10/2023   Screening for osteoporosis 02/08/2023   Lung nodule 12/24/2022   Atherosclerosis of aorta (HCC) 11/02/2022   Right flank pain 08/17/2022   Left hip pain 02/23/2021   Low back pain 02/23/2021   Gait abnormality 02/23/2021   Bilateral lower extremity edema 01/22/2021   Elevated CK 10/12/2020   Spinal stenosis 10/09/2020   Lumbar adjacent segment disease with spondylolisthesis 10/09/2020   Spinal stenosis, lumbar region with neurogenic claudication 04/16/2020   Peripheral neuropathy 11/28/2019   Paresthesia and pain of both upper extremities 11/12/2019   Chronic midline low back pain with bilateral sciatica 10/30/2019   Left thigh pain 10/30/2019   Meralgia paresthetica, left 09/12/2019   Everitt Quervain's disease (tenosynovitis) 02/06/2019   Acute bilateral low back pain without sciatica 12/01/2018   Failed back syndrome of lumbar spine 11/25/2017   Inflammation of sacroiliac joint (HCC) 06/07/2017   Displacement of lumbar intervertebral disc 11/22/2016   Fatty liver 09/27/2016   Status post  bilateral breast reduction 03/23/2016   Hypertrophy of breast 08/12/2015   Spondylolisthesis of lumbar region 06/13/2015   History of lumbar fusion 04/07/2015   Radiculopathy, lumbar region 04/07/2015   Essential hypertension 02/14/2015   Pelvic pain 12/02/2014   Postprandial vomiting 02/04/2014   Obesity (BMI 30-39.9) 11/01/2013   Carotid artery stenosis 06/07/2013   Hereditary and idiopathic peripheral neuropathy 06/07/2013   History of migraine 05/04/2013   Type 2 diabetes mellitus with other specified complication (HCC) 05/05/2011   SVT (supraventricular tachycardia) (HCC) 11/03/2010   GERD  (gastroesophageal reflux disease) 10/29/2010   Chronic low back pain 10/29/2010   Hyperlipidemia associated with type 2 diabetes mellitus (HCC) 10/29/2010    PCP: Alvan Dorothyann BIRCH, MD  REFERRING PROVIDER:  Booker Darice SAUNDERS, FNP (encounter for medicare annual wellness exam)   Alvan Dorothyann BIRCH, MD (other diagnoses)   REFERRING DIAG:  Z00.00 (ICD-10-CM) - Encounter for Medicare annual wellness exam   R07.9 (ICD-10-CM) - Right-sided chest pain  M89.8X1 (ICD-10-CM) - Clavicle pain  M54.50 (ICD-10-CM) - Acute bilateral low back pain without sciatica  S20.01XA (ICD-10-CM) - Contusion of right breast, initial encounter   Rationale for Evaluation and Treatment: Rehabilitation  THERAPY DIAG:  Other low back pain  Unsteadiness on feet  Repeated falls  Muscle weakness (generalized)  Abnormal posture  ONSET DATE: chronic    SUBJECTIVE:                                                                                                                                                                                           SUBJECTIVE STATEMENT: Patient reports she feels like her balance and strength is getting better. Her nephew is going to go with her to [employer] and show her how to use the equipment. No falls since the last visit. She has been completing her HEP.    PERTINENT HISTORY:  Back surgery x 2 Breast surgery  Lumbar wound debridement  Rt shoulder surgery x 2  Spinal cord stimulator   PAIN:  Are you having pain? Yes: NPRS scale: none currently; at worst 8/10 Pain location: low back/posterior hips Pain description: sore Aggravating factors: household activity Relieving factors: rest  PRECAUTIONS:  Fall  RED FLAGS: None   WEIGHT BEARING RESTRICTIONS:  No  FALLS:  Has patient fallen in last 6 months? Yes. Number of falls 4; see subjective  LIVING ENVIRONMENT: Lives with: lives alone Lives in: House/apartment Stairs: No Has following equipment  at home: Single point cane, Environmental Consultant - 2 wheeled, shower chair, and Grab bars  OCCUPATION:  Retired   PLOF:  Independent  PATIENT GOALS:  Be able to do  things much better and work around the house. No more falls.  NEXT MD VISIT: March 2025   OBJECTIVE:  Note: Objective measures were completed at Evaluation unless otherwise noted.  DIAGNOSTIC FINDINGS:  No recent imaging on file   PATIENT SURVEYS:  ABC scale 43% self-confidence   05/05/23: 43% self-confidence  COGNITIVE STATUS: Within functional limits for tasks assessed   SENSATION: WFL   POSTURE:  rounded shoulders, forward head, and increased thoracic kyphosis   GAIT: Distance walked: 10 ft  Assistive device utilized: Single point cane Level of assistance: Modified independence Comments: decreased step length; limited push-off, narrow base; no arm swing, forward flexed posture    Body Part #1 Lumbar   LUMBAR ROM:   Active  A/PROM  eval  Flexion 50% limited  Extension 75% limited   Right lateral flexion WFL  Left lateral flexion WFL  Right rotation 50% limited  Left rotation 50% limited   (Blank rows = not tested); pain with all lumbar AROM   LOWER EXTREMITY MMT:    MMT Right eval Left eval 03/15/23 04/26/23 R/L 05/05/23   Hip flexion 3+ 3+  4/4 4+ bilateral   Hip extension       Hip abduction 3+ 3+  4/4- Lt: 4-; Rt: 4  Hip adduction       Hip internal rotation       Hip external rotation       Knee flexion 4 4  4+/4 Lt: 4; Rt: 4+  Knee extension 4 4  4+/4 Lt: 4; Rt: 5   Ankle dorsiflexion 5 5     Ankle plantarflexion Partial range DL calf raise Partial range DL calf raise Minimal range SL calf raise bilaterally  Minimal range SL calf raise bilaterally  Minimal range SL calf raise bilaterally   Ankle inversion       Ankle eversion 5 5      (Blank rows = not tested)    SPECIAL TESTS:  SLR (+)   FUNCTIONAL TESTS:  30 second chair test: 4 reps TUG: 24 seconds  03/15/23:  30 second chair  test: 10 reps  TUG: 17.3 seconds      BERG BALANCE TEST Sitting to Standing: 4.      Stands without using hands and stabilize independently Standing Unsupported: 4.      Stands safely for 2 minutes Sitting Unsupported: 4.     Sits for 2 minutes independently Standing to Sitting: 3.     Controls descent with hands  Transfers: 3.     Transfers safely definite use of hands Standing with eyes closed: 3.     Stands 10 seconds with supervision Standing with feet together: 4.     Stands for 1 minute safely Reaching forward with outstretched arm: 3.     Reaches forward 5 inches Retrieving object from the floor: 3.     Able to pick up with supervision Turning to look behind: 3.     Looks behind one side only, other side less weight shift Turning 360 degrees: 2.     Able to turn slowly, but safely Place alternate foot on stool: 3.     Completes 8 steps in >20 seconds Standing with one foot in front: 2.     Independent small step for 30 seconds Standing on one foot: 1.     Holds <3 seconds  Total Score: 39/56  05/05/23 BERG BALANCE TEST Sitting to Standing: 4.      Stands without using hands  and stabilize independently Standing Unsupported: 4.      Stands safely for 2 minutes Sitting Unsupported: 4.     Sits for 2 minutes independently Standing to Sitting: 4.     Sits safely with minimal use of hands Transfers: 4.     Transfers safely with minor use of hands Standing with eyes closed: 3.     Stands 10 seconds with supervision Standing with feet together: 4.     Stands for 1 minute safely Reaching forward with outstretched arm: 3.     Reaches forward 5 inches Retrieving object from the floor: 4.      Able to pick up easily and safely Turning to look behind: 2.     Turns sideways only, maintains balance Turning 360 degrees: 4.     Able to turn in </=4 seconds  Place alternate foot on stool: 4.     Completes 8 steps in 20 seconds     Standing with one foot in front: 2.     Independent small step  for 30 seconds Standing on one foot: 3.     Holds 5-10 seconds  Total Score: 49/56   [facility] Adult PT Treatment:                                                DATE: 05/05/23 Therapeutic Exercise: Resisted knee extension 2  x 10 green band  SL calf raise 2 x 10  Standing hip extension 2 x 10  Updated HEP   Therapeutic Activity: Re-assessment to determine overall progress, educating patient on progress towards goals.    [facility] Adult PT Treatment:                                                DATE: 04/26/2023 Therapeutic Exercise: NuStep L6 x Hip/knee MMT Therapeutic Activity: Updating LTGs    [facility] Adult PT Treatment:                                                DATE: 04/12/2023 Therapeutic Exercise: NuStep L6 x Leg press DL 34# k79 89# pulley --> rows 3x10 Resisted trunk rotation --> 5# pulley x10 (B) (R) shoulder flexion slides up wall --> finger ladder (shaking at #15) L stretch at counter Standing hip abd  + 4#AW Standing marching + 4#AW LTR x10 STS x10 --> seated edge of corner x15 Standing 1/2 tandem + slow head turns   [facility] Adult PT Treatment:                                                DATE: 05/05/2022 Therapeutic Exercise: NuStep L6 x + fall prevention at home review (handout) Standing in corner with chair in front: Marching (support prn) + 3#AW 2x20 Standing squats 2x10 Walking + bilateral purse carries 5#Kbs Blue foam beam: Lateral weight shifting Side stepping  NBOS walking --> marching (fwd) STS + cradling 5#DB 2x10   PATIENT  EDUCATION:  Education details: HEP update; see treatment  Person educated: Patient Education method: Explanation, Demonstration, Tactile cues, Verbal cues, and Handouts Education comprehension: verbalized understanding, returned demonstration, verbal cues required, tactile cues required, and needs further education  HOME EXERCISE PROGRAM: Access Code: [email or web address] URL: [email or web address] Date:  05/05/2023 Prepared by: Lucie Meeter  Exercises - Supine Lower Trunk Rotation with PLB  - 1 x daily - 7 x weekly - 3 sets - 10 reps - Sit to Stand with Armchair  - 1 x daily - 7 x weekly - 2 sets - 10 reps - Seated Scapular Retraction  - 1 x daily - 7 x weekly - 3 sets - 10 reps - Seated Thoracic Self-Mobilization  - 1 x daily - 7 x weekly - 3 sets - 10 reps - 3 sec hold - Standing Bicep Curls Supinated with Dumbbells  - 1 x daily - 7 x weekly - 3 sets - 10 reps - Side Stepping with Counter Support  - 1 x daily - 7 x weekly - 2 sets - 10 reps - Standing Hip Abduction with Counter Support  - 1 x daily - 7 x weekly - 2 sets - 10 reps - Standing Hip Extension with Counter Support  - 1 x daily - 7 x weekly - 2 sets - 10 reps - Standing Romberg to 1/2 Tandem Stance  - 1 x daily - 7 x weekly - 3 sets - 30 sec  hold - Seated Knee Extension with Resistance  - 1 x daily - 7 x weekly - 2 sets - 10 reps - Single Leg Heel Raise with Counter Support  - 1 x daily - 7 x weekly - 2 sets - 10 reps   ASSESSMENT:  CLINICAL IMPRESSION: Omaya is making steady progress in PT demonstrating improvements in strength and balance since the start of care. She has met all short term functional goals and 2/6 long term functional goals. She continues to exhibit weakness in bilateral hips and knees (Lt>Rt) and has experienced a couple falls during her POC. While her balance has improved as noted through increased score on the BERG her balance confidence remains unchanged on the ABC scale noting poor confidence with community specific tasks and activities on uneven/unstable surfaces. She will benefit from continued skilled PT 2 x week for 6 additional weeks to address lingering strength and balance deficits in order to optimize her function and further reduce her fall risk.   OBJECTIVE IMPAIRMENTS: Abnormal gait, decreased activity tolerance, decreased balance, decreased endurance, decreased knowledge of condition, decreased  knowledge of use of DME, decreased mobility, difficulty walking, decreased ROM, decreased strength, decreased safety awareness, impaired flexibility, improper body mechanics, postural dysfunction, and pain.   ACTIVITY LIMITATIONS: carrying, lifting, bending, standing, squatting, stairs, transfers, and locomotion level  PARTICIPATION LIMITATIONS: meal prep, cleaning, laundry, shopping, community activity, and yard work  PERSONAL FACTORS: Age, Fitness, Time since onset of injury/illness/exacerbation, and 3+ comorbidities: see PMH/PSH above  are also affecting patient's functional outcome.   REHAB POTENTIAL: Fair see personal factors  CLINICAL DECISION MAKING: Evolving/moderate complexity  EVALUATION COMPLEXITY: Moderate   GOALS: Goals reviewed with patient? Yes  SHORT TERM GOALS: Target date: 03/24/2023  Patient will be independent and compliant with initial HEP.  Baseline: Goal status: MET  2.  Patient will complete at least 7 reps during 30 second chair rise to improve her functional strength.  Baseline: see above  Goal status: MET  3.  Patient will complete partial range SL  calf raise to improve push-off during gait cycle.  Baseline: see above  Goal status: MET  4.  Patient will complete TUG in </=18 seconds to reduce her risk of falls.  Baseline: see above  Goal status: MET   LONG TERM GOALS: Target date: 06/18/23  Patient will score at least 53% self confidence on the ABC scale.  Baseline: 41.9% 05/05/23: 41% Goal status: in progress   2.  Patient will be independent with floor transfers. Baseline: assist from table to stand 05/04/22: requires cues for transfer.  Goal status: IN PROGRESS  3.  Patient will be modified independent with curb negotiation.  Baseline: SPC Goal status: MET  4.  Patient will demonstrate at least 4/5 bilateral hip strength to improve stability about the chain with walking/standing activity.  Baseline: see above Goal status: partially met    5.  Patient will demonstrate 5/5 bilateral knee strength to improve stability with stair/curb negotiation.  Baseline: see above  Goal status: partially met   6.  Patient will score at least 45/56 on BERG balance to reduce her risk of falls.  Baseline: 39/56  Goal status: MET   PLAN:  PT FREQUENCY: 2x/week  PT DURATION: 6 weeks  PLANNED INTERVENTIONS: 97164- PT Re-evaluation, 97110-Therapeutic exercises, 97530- Therapeutic activity, 97112- Neuromuscular re-education, 97535- Self Care, [id number]- Manual therapy, 97116- Gait training, Balance training, Stair training, Taping, Dry Needling, DME instructions, Cryotherapy, and Moist heat.  PLAN FOR NEXT SESSION: Generalized lower extremity strengthening (STANDING), Knee strengthening; balance on unstable surface, dynamic gait tasks. Work on floor transfers.   Dyshawn Cangelosi, PT, DPT, ATC 05/05/23 8:43 AM
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
## 2023-05-06 LAB — CBC WITH DIFFERENTIAL/PLATELET
Basophils Absolute: 0.1 10*3/uL (ref 0.0–0.2)
Basos: 1 %
EOS (ABSOLUTE): 0.1 10*3/uL (ref 0.0–0.4)
Eos: 1 %
Hematocrit: 43.7 % (ref 34.0–46.6)
Hemoglobin: 13.5 g/dL (ref 11.1–15.9)
Immature Grans (Abs): 0 10*3/uL (ref 0.0–0.1)
Immature Granulocytes: 0 %
Lymphocytes Absolute: 3 10*3/uL (ref 0.7–3.1)
Lymphs: 35 %
MCH: 26.2 pg — ABNORMAL LOW (ref 26.6–33.0)
MCHC: 30.9 g/dL — ABNORMAL LOW (ref 31.5–35.7)
MCV: 85 fL (ref 79–97)
Monocytes Absolute: 0.9 10*3/uL (ref 0.1–0.9)
Monocytes: 10 %
Neutrophils Absolute: 4.6 10*3/uL (ref 1.4–7.0)
Neutrophils: 53 %
Platelets: 275 10*3/uL (ref 150–450)
RBC: 5.16 x10E6/uL (ref 3.77–5.28)
RDW: 14.7 % (ref 11.7–15.4)
WBC: 8.6 10*3/uL (ref 3.4–10.8)

## 2023-05-06 LAB — URINALYSIS, ROUTINE W REFLEX MICROSCOPIC
Bilirubin, UA: NEGATIVE
Glucose, UA: NEGATIVE
Ketones, UA: NEGATIVE
Nitrite, UA: NEGATIVE
RBC, UA: NEGATIVE
Specific Gravity, UA: 1.02 (ref 1.005–1.030)
Urobilinogen, Ur: 1 mg/dL (ref 0.2–1.0)
pH, UA: 6.5 (ref 5.0–7.5)

## 2023-05-06 LAB — MICROSCOPIC EXAMINATION
Bacteria, UA: NONE SEEN
Casts: NONE SEEN /[LPF]
RBC, Urine: NONE SEEN /[HPF] (ref 0–2)
WBC, UA: NONE SEEN /[HPF] (ref 0–5)

## 2023-05-06 LAB — SEDIMENTATION RATE: Sed Rate: 6 mm/h (ref 0–40)

## 2023-05-06 NOTE — Progress Notes (Signed)
 Hi Shantella, labs look reassuring no sign of any systemic infection which is great.  No sign of UTI.  Still waiting on the hip film.  How are you feeling today?

## 2023-05-10 ENCOUNTER — Ambulatory Visit: Payer: 59

## 2023-05-10 DIAGNOSIS — R2681 Unsteadiness on feet: Secondary | ICD-10-CM | POA: Diagnosis not present

## 2023-05-10 DIAGNOSIS — M6281 Muscle weakness (generalized): Secondary | ICD-10-CM

## 2023-05-10 DIAGNOSIS — M5459 Other low back pain: Secondary | ICD-10-CM | POA: Diagnosis not present

## 2023-05-10 DIAGNOSIS — R293 Abnormal posture: Secondary | ICD-10-CM | POA: Diagnosis not present

## 2023-05-10 DIAGNOSIS — R296 Repeated falls: Secondary | ICD-10-CM

## 2023-05-10 NOTE — Therapy (Signed)
 OUTPATIENT PHYSICAL THERAPY TREATMENT    Patient Name: Amanda Morris MRN: [id number] DOB:May 04, 1950, 73 y.o., female Today's Date: 05/10/2023  END OF SESSION:  PT End of Session - 05/10/23 0847     Visit Number 12    Number of Visits 23    Date for PT Re-Evaluation 06/18/23    Authorization Type [employer] MCR/MCD    Progress Note Due on Visit 20    PT Start Time 0848    PT Stop Time 0927    PT Time Calculation (min) 39 min    Activity Tolerance Patient tolerated treatment well    Behavior During Therapy WFL for tasks assessed/performed            Past Medical History:  Diagnosis Date   Allergy    Diabetes mellitus without complication (HCC)    type 2   GERD (gastroesophageal reflux disease)    Headache    History of hiatal hernia    Hypercholesterolemia    Numbness    left, outer thigh   Palpitations    in the past, no current issues per patient   Postlaminectomy syndrome    Past Surgical History:  Procedure Laterality Date   ABDOMINAL HYSTERECTOMY  1982   BACK SURGERY     x 2    BREAST REDUCTION SURGERY Bilateral 03/17/2016   Procedure: MAMMARY REDUCTION  (BREAST);  Surgeon: Estefana GORMAN Fritter, DO;  Location: [facility];  Service: Plastics;  Laterality: Bilateral;   BREAST SURGERY     COLONOSCOPY     LUMBAR WOUND DEBRIDEMENT N/A 05/22/2020   Procedure: Incision and drainage of lumbar wound;  Surgeon: Gillie Duncans, MD;  Location: [facility] OR;  Service: Neurosurgery;  Laterality: N/A;   SHOULDER SURGERY  06/2008, 09/2010   Right, by Dr. Lorence, then Dr. Wilbern   SPINAL CORD STIMULATOR INSERTION N/A 12/02/2017   Procedure: LUMBAR SPINAL CORD STIMULATOR INSERTION;  Surgeon: Gillie Duncans, MD;  Location: [facility] OR;  Service: Neurosurgery;  Laterality: N/A;   SPINAL CORD STIMULATOR TRIAL N/A 11/25/2017   Procedure: INSERTION LUMBAR SPINAL CORD STIMULATOR TRIAL  ;  Surgeon: Gillie Duncans, MD;  Location: [facility] OR;  Service: Neurosurgery;  Laterality: N/A;    INSERTION LUMBAR SPINAL CORD STIMULATOR TRIAL     UPPER GI ENDOSCOPY     Patient Active Problem List   Diagnosis Date Noted   IBS (irritable bowel syndrome) 04/28/2023   Hiatal hernia 04/28/2023   Osteopenia after menopause 02/18/2023   Frequent falls 02/10/2023   Screening for osteoporosis 02/08/2023   Lung nodule 12/24/2022   Atherosclerosis of aorta (HCC) 11/02/2022   Right flank pain 08/17/2022   Left hip pain 02/23/2021   Low back pain 02/23/2021   Gait abnormality 02/23/2021   Bilateral lower extremity edema 01/22/2021   Elevated CK 10/12/2020   Spinal stenosis 10/09/2020   Lumbar adjacent segment disease with spondylolisthesis 10/09/2020   Spinal stenosis, lumbar region with neurogenic claudication 04/16/2020   Peripheral neuropathy 11/28/2019   Paresthesia and pain of both upper extremities 11/12/2019   Chronic midline low back pain with bilateral sciatica 10/30/2019   Left thigh pain 10/30/2019   Meralgia paresthetica, left 09/12/2019   Everitt Quervain's disease (tenosynovitis) 02/06/2019   Acute bilateral low back pain without sciatica 12/01/2018   Failed back syndrome of lumbar spine 11/25/2017   Inflammation of sacroiliac joint (HCC) 06/07/2017   Displacement of lumbar intervertebral disc 11/22/2016   Fatty liver 09/27/2016   Status post bilateral breast reduction 03/23/2016  Hypertrophy of breast 08/12/2015   Spondylolisthesis of lumbar region 06/13/2015   History of lumbar fusion 04/07/2015   Radiculopathy, lumbar region 04/07/2015   Essential hypertension 02/14/2015   Pelvic pain 12/02/2014   Postprandial vomiting 02/04/2014   Obesity (BMI 30-39.9) 11/01/2013   Carotid artery stenosis 06/07/2013   Hereditary and idiopathic peripheral neuropathy 06/07/2013   History of migraine 05/04/2013   Type 2 diabetes mellitus with other specified complication (HCC) 05/05/2011   SVT (supraventricular tachycardia) (HCC) 11/03/2010   GERD (gastroesophageal reflux disease)  10/29/2010   Chronic low back pain 10/29/2010   Hyperlipidemia associated with type 2 diabetes mellitus (HCC) 10/29/2010    PCP: Alvan Dorothyann BIRCH, MD  REFERRING PROVIDER:  Booker Darice SAUNDERS, FNP (encounter for medicare annual wellness exam)   Alvan Dorothyann BIRCH, MD (other diagnoses)   REFERRING DIAG:  Z00.00 (ICD-10-CM) - Encounter for Medicare annual wellness exam   R07.9 (ICD-10-CM) - Right-sided chest pain  M89.8X1 (ICD-10-CM) - Clavicle pain  M54.50 (ICD-10-CM) - Acute bilateral low back pain without sciatica  S20.01XA (ICD-10-CM) - Contusion of right breast, initial encounter   Rationale for Evaluation and Treatment: Rehabilitation  THERAPY DIAG:  Other low back pain  Unsteadiness on feet  Repeated falls  Muscle weakness (generalized)  Abnormal posture  ONSET DATE: chronic    SUBJECTIVE:                                                                                                                                                                                           SUBJECTIVE STATEMENT: Patient reports she lost her balance this morning while getting dressed but was able to catch herself and did not fall. Patient is wearing life alert necklace. Patient states she has plans to go to gym with her nephew soon and he wil s   PERTINENT HISTORY:  Back surgery x 2 Breast surgery  Lumbar wound debridement  Rt shoulder surgery x 2  Spinal cord stimulator   PAIN:  Are you having pain? Yes: NPRS scale: none currently; at worst 8/10 Pain location: low back/posterior hips Pain description: sore Aggravating factors: household activity Relieving factors: rest  PRECAUTIONS:  Fall  RED FLAGS: None   WEIGHT BEARING RESTRICTIONS:  No  FALLS:  Has patient fallen in last 6 months? Yes. Number of falls 4; see subjective  LIVING ENVIRONMENT: Lives with: lives alone Lives in: House/apartment Stairs: No Has following equipment at home: Single point cane,  Environmental Consultant - 2 wheeled, shower chair, and Grab bars  OCCUPATION:  Retired   PLOF:  Independent  PATIENT GOALS:  Be able to do things much better and work around  the house. No more falls.  NEXT MD VISIT: March 2025   OBJECTIVE:  Note: Objective measures were completed at Evaluation unless otherwise noted.  DIAGNOSTIC FINDINGS:  No recent imaging on file   PATIENT SURVEYS:  ABC scale 43% self-confidence   05/05/23: 43% self-confidence  COGNITIVE STATUS: Within functional limits for tasks assessed   SENSATION: WFL   POSTURE:  rounded shoulders, forward head, and increased thoracic kyphosis   GAIT: Distance walked: 10 ft  Assistive device utilized: Single point cane Level of assistance: Modified independence Comments: decreased step length; limited push-off, narrow base; no arm swing, forward flexed posture    Body Part #1 Lumbar   LUMBAR ROM:   Active  A/PROM  eval  Flexion 50% limited  Extension 75% limited   Right lateral flexion WFL  Left lateral flexion WFL  Right rotation 50% limited  Left rotation 50% limited   (Blank rows = not tested); pain with all lumbar AROM   LOWER EXTREMITY MMT:    MMT Right eval Left eval 03/15/23 04/26/23 R/L 05/05/23   Hip flexion 3+ 3+  4/4 4+ bilateral   Hip extension       Hip abduction 3+ 3+  4/4- Lt: 4-; Rt: 4  Hip adduction       Hip internal rotation       Hip external rotation       Knee flexion 4 4  4+/4 Lt: 4; Rt: 4+  Knee extension 4 4  4+/4 Lt: 4; Rt: 5   Ankle dorsiflexion 5 5     Ankle plantarflexion Partial range DL calf raise Partial range DL calf raise Minimal range SL calf raise bilaterally  Minimal range SL calf raise bilaterally  Minimal range SL calf raise bilaterally   Ankle inversion       Ankle eversion 5 5      (Blank rows = not tested)    SPECIAL TESTS:  SLR (+)   FUNCTIONAL TESTS:  30 second chair test: 4 reps TUG: 24 seconds  03/15/23:  30 second chair test: 10 reps  TUG: 17.3  seconds      BERG BALANCE TEST Sitting to Standing: 4.      Stands without using hands and stabilize independently Standing Unsupported: 4.      Stands safely for 2 minutes Sitting Unsupported: 4.     Sits for 2 minutes independently Standing to Sitting: 3.     Controls descent with hands  Transfers: 3.     Transfers safely definite use of hands Standing with eyes closed: 3.     Stands 10 seconds with supervision  Reaching forward with outstretched arm: 3.     Reaches forward 5 inches Retrieving object from the floor: 3.     Able to pick up with supervision Turning to look behind: 3.     Looks behind one side only, other side less weight shift Turning 360 degrees: 2.     Able to turn slowly, but safely Place alternate foot on stool: 3.     Completes 8 steps in >20 seconds Standing with one foot in front: 2.     Independent small step for 30 seconds Standing on one foot: 1.     Holds <3 seconds  Total Score: 39/56  05/05/23 BERG BALANCE TEST Sitting to Standing: 4.      Stands without using hands and stabilize independently Standing Unsupported: 4.      Stands safely for 2 minutes Sitting Unsupported: 4.  Sits for 2 minutes independently Standing to Sitting: 4.     Sits safely with minimal use of hands Transfers: 4.     Transfers safely with minor use of hands Standing with eyes closed: 3.     Stands 10 seconds with supervision Standing with feet together: 4.     Stands for 1 minute safely Reaching forward with outstretched arm: 3.     Reaches forward 5 inches Retrieving object from the floor: 4.      Able to pick up easily and safely Turning to look behind: 2.     Turns sideways only, maintains balance Turning 360 degrees: 4.     Able to turn in </=4 seconds  Place alternate foot on stool: 4.     Completes 8 steps in 20 seconds     Standing with one foot in front: 2.     Independent small step for 30 seconds Standing on one foot: 3.     Holds 5-10 seconds  Total Score:  49/56   [facility] Adult PT Treatment:                                                DATE: 05/10/2023 Therapeutic Exercise: Fwd marching + same knee taps Bkwd walking STS from corner of table x10 --> cradling 4#DB x10 --> cradling 6#DB x10 Seated dead lift + 10#KB x10  Therapeutic Activity: Functional squatting activities: Standing squats + tap reach with block on floor Staggered stance squat + tap reach with block on floor Fingertip supported single leg balance 2x30 (B) Kickstand SLB + head turns SLB + UE support + head turns Standing on airex: Lateral weight shifting --> small marching Marching Placing cones from counter to top shelf  Squat + touch down on high --> mid blocks    [facility] Adult PT Treatment:                                                DATE: 05/05/23 Therapeutic Exercise: Resisted knee extension 2  x 10 green band  SL calf raise 2 x 10  Standing hip extension 2 x 10  Updated HEP  Therapeutic Activity: Re-assessment to determine overall progress, educating patient on progress towards goals.    [facility] Adult PT Treatment:                                                DATE: 04/26/2023 Therapeutic Exercise: NuStep L6 x Hip/knee MMT Therapeutic Activity: Updating LTGs    [facility] Adult PT Treatment:                                                DATE: 04/12/2023 Therapeutic Exercise: NuStep L6 x Leg press DL 34# k79 89# pulley --> rows 3x10 Resisted trunk rotation --> 5# pulley x10 (B) (R) shoulder flexion slides up wall --> finger ladder (shaking at #15) L stretch at counter Standing hip abd  + 4#AW  Standing marching + 4#AW LTR x10 STS x10 --> seated edge of corner x15 Standing 1/2 tandem + slow head turns   PATIENT EDUCATION:  Education details: HEP update; see treatment  Person educated: Patient Education method: Explanation, Demonstration, Tactile cues, Verbal cues, and Handouts Education comprehension: verbalized understanding, returned  demonstration, verbal cues required, tactile cues required, and needs further education  HOME EXERCISE PROGRAM: Access Code: [email or web address] URL: [email or web address] Date: 05/05/2023 Prepared by: Lucie Meeter  Exercises - Supine Lower Trunk Rotation with PLB  - 1 x daily - 7 x weekly - 3 sets - 10 reps - Sit to Stand with Armchair  - 1 x daily - 7 x weekly - 2 sets - 10 reps - Seated Scapular Retraction  - 1 x daily - 7 x weekly - 3 sets - 10 reps - Seated Thoracic Self-Mobilization  - 1 x daily - 7 x weekly - 3 sets - 10 reps - 3 sec hold - Standing Bicep Curls Supinated with Dumbbells  - 1 x daily - 7 x weekly - 3 sets - 10 reps - Side Stepping with Counter Support  - 1 x daily - 7 x weekly - 2 sets - 10 reps - Standing Hip Abduction with Counter Support  - 1 x daily - 7 x weekly - 2 sets - 10 reps - Standing Hip Extension with Counter Support  - 1 x daily - 7 x weekly - 2 sets - 10 reps - Standing Romberg to 1/2 Tandem Stance  - 1 x daily - 7 x weekly - 3 sets - 30 sec  hold - Seated Knee Extension with Resistance  - 1 x daily - 7 x weekly - 2 sets - 10 reps - Single Leg Heel Raise with Counter Support  - 1 x daily - 7 x weekly - 2 sets - 10 reps   ASSESSMENT:  CLINICAL IMPRESSION: Session focused on dynamic balance activities and progressing to compliant surfaces. Cueing provided to improve upright standing posture during squat activities. Greater weakness noted on L LE as compared to R LE during single leg balance. Cueing required to widen base of support and increase strode length during backwards walking.   OBJECTIVE IMPAIRMENTS: Abnormal gait, decreased activity tolerance, decreased balance, decreased endurance, decreased knowledge of condition, decreased knowledge of use of DME, decreased mobility, difficulty walking, decreased ROM, decreased strength, decreased safety awareness, impaired flexibility, improper body mechanics, postural dysfunction, and pain.   ACTIVITY  LIMITATIONS: carrying, lifting, bending, standing, squatting, stairs, transfers, and locomotion level  PARTICIPATION LIMITATIONS: meal prep, cleaning, laundry, shopping, community activity, and yard work  PERSONAL FACTORS: Age, Fitness, Time since onset of injury/illness/exacerbation, and 3+ comorbidities: see PMH/PSH above  are also affecting patient's functional outcome.   REHAB POTENTIAL: Fair see personal factors  CLINICAL DECISION MAKING: Evolving/moderate complexity  EVALUATION COMPLEXITY: Moderate   GOALS: Goals reviewed with patient? Yes  SHORT TERM GOALS: Target date: 03/24/2023  Patient will be independent and compliant with initial HEP.  Baseline: Goal status: MET  2.  Patient will complete at least 7 reps during 30 second chair rise to improve her functional strength.  Baseline: see above  Goal status: MET  3.  Patient will complete partial range SL calf raise to improve push-off during gait cycle.  Baseline: see above  Goal status: MET  4.  Patient will complete TUG in </=18 seconds to reduce her risk of falls.  Baseline: see above  Goal status: MET   LONG TERM GOALS:  Target date: 06/18/23  Patient will score at least 53% self confidence on the ABC scale.  Baseline: 41.9% 05/05/23: 41% Goal status: in progress   2.  Patient will be independent with floor transfers. Baseline: assist from table to stand 05/04/22: requires cues for transfer.  Goal status: IN PROGRESS  3.  Patient will be modified independent with curb negotiation.  Baseline: SPC Goal status: MET  4.  Patient will demonstrate at least 4/5 bilateral hip strength to improve stability about the chain with walking/standing activity.  Baseline: see above Goal status: partially met   5.  Patient will demonstrate 5/5 bilateral knee strength to improve stability with stair/curb negotiation.  Baseline: see above  Goal status: partially met   6.  Patient will score at least 45/56 on BERG balance  to reduce her risk of falls.  Baseline: 39/56  Goal status: MET   PLAN:  PT FREQUENCY: 2x/week  PT DURATION: 6 weeks  PLANNED INTERVENTIONS: 97164- PT Re-evaluation, 97110-Therapeutic exercises, 97530- Therapeutic activity, 97112- Neuromuscular re-education, 97535- Self Care, [id number]- Manual therapy, 97116- Gait training, Balance training, Stair training, Taping, Dry Needling, DME instructions, Cryotherapy, and Moist heat.  PLAN FOR NEXT SESSION: Generalized lower extremity strengthening (STANDING), Knee strengthening; balance on unstable surface, dynamic gait tasks. Work on floor transfers.   Lamarr Price, PTA 05/10/23 9:28 AM

## 2023-05-11 DIAGNOSIS — M19012 Primary osteoarthritis, left shoulder: Secondary | ICD-10-CM | POA: Diagnosis not present

## 2023-05-11 DIAGNOSIS — M19011 Primary osteoarthritis, right shoulder: Secondary | ICD-10-CM | POA: Diagnosis not present

## 2023-05-11 DIAGNOSIS — M25512 Pain in left shoulder: Secondary | ICD-10-CM | POA: Diagnosis not present

## 2023-05-12 ENCOUNTER — Other Ambulatory Visit: Payer: Self-pay

## 2023-05-12 ENCOUNTER — Ambulatory Visit: Payer: 59

## 2023-05-12 DIAGNOSIS — R2681 Unsteadiness on feet: Secondary | ICD-10-CM

## 2023-05-12 DIAGNOSIS — R296 Repeated falls: Secondary | ICD-10-CM | POA: Diagnosis not present

## 2023-05-12 DIAGNOSIS — M6281 Muscle weakness (generalized): Secondary | ICD-10-CM | POA: Diagnosis not present

## 2023-05-12 DIAGNOSIS — M5459 Other low back pain: Secondary | ICD-10-CM

## 2023-05-12 DIAGNOSIS — R293 Abnormal posture: Secondary | ICD-10-CM | POA: Diagnosis not present

## 2023-05-12 MED ORDER — ONDANSETRON HCL 4 MG PO TABS: 4.0000 mg | ORAL_TABLET | Freq: Two times a day (BID) | ORAL | 1 refills | Status: DC

## 2023-05-12 NOTE — Telephone Encounter (Signed)
Patient informed. 

## 2023-05-12 NOTE — Telephone Encounter (Signed)
Spoke with patient. Informed of results.  She also states that she is out of ondansetron - given on 04/28/23. She states  this was working for her.  She is requesting a rx rf - states she takes this BID

## 2023-05-12 NOTE — Telephone Encounter (Signed)
Copied from CRM [id number]. Topic: Clinical - Lab/Test Results >> May 11, 2023  3:37 PM Hector Shade B wrote: Reason for CRM: patient stated she had some testing done yesterday and received a call earlier today from the office.  She has requested a call back due to missing the phone call this morning [phone]

## 2023-05-12 NOTE — Therapy (Signed)
OUTPATIENT PHYSICAL THERAPY TREATMENT    Patient Name: Amanda Morris MRN: [id number] DOB:May 07, 1950, 73 y.o., female Today's Date: 05/12/2023  END OF SESSION:  PT End of Session - 05/12/23 0849     Visit Number 13    Number of Visits 23    Date for PT Re-Evaluation 06/18/23    Authorization Type [employer] MCR/MCD    Progress Note Due on Visit 20    PT Start Time 0848    PT Stop Time 0926    PT Time Calculation (min) 38 min    Activity Tolerance Patient tolerated treatment well    Behavior During Therapy WFL for tasks assessed/performed            Past Medical History:  Diagnosis Date   Allergy    Diabetes mellitus without complication (HCC)    type 2   GERD (gastroesophageal reflux disease)    Headache    History of hiatal hernia    Hypercholesterolemia    Numbness    left, outer thigh   Palpitations    in the past, no current issues per patient   Postlaminectomy syndrome    Past Surgical History:  Procedure Laterality Date   ABDOMINAL HYSTERECTOMY  1982   BACK SURGERY     x 2    BREAST REDUCTION SURGERY Bilateral 03/17/2016   Procedure: MAMMARY REDUCTION  (BREAST);  Surgeon: Peggye Form, DO;  Location: [facility];  Service: Plastics;  Laterality: Bilateral;   BREAST SURGERY     COLONOSCOPY     LUMBAR WOUND DEBRIDEMENT N/A 05/22/2020   Procedure: Incision and drainage of lumbar wound;  Surgeon: Coletta Memos, MD;  Location: [facility] OR;  Service: Neurosurgery;  Laterality: N/A;   SHOULDER SURGERY  06/2008, 09/2010   Right, by Dr. Anthoney Harada, then Dr. Bayard Beaver   SPINAL CORD STIMULATOR INSERTION N/A 12/02/2017   Procedure: LUMBAR SPINAL CORD STIMULATOR INSERTION;  Surgeon: Coletta Memos, MD;  [facility];  Service: Neurosurgery;  Laterality: N/A;   SPINAL CORD STIMULATOR TRIAL N/A 11/25/2017   Procedure: INSERTION LUMBAR SPINAL CORD STIMULATOR TRIAL  ;  Surgeon: Coletta Memos, MD;  Location: [facility] OR;  Service: Neurosurgery;  Laterality: N/A;    INSERTION LUMBAR SPINAL CORD STIMULATOR TRIAL     UPPER GI ENDOSCOPY     Patient Active Problem List   Diagnosis Date Noted   IBS (irritable bowel syndrome) 04/28/2023   Hiatal hernia 04/28/2023   Osteopenia after menopause 02/18/2023   Frequent falls 02/10/2023   Screening for osteoporosis 02/08/2023   Lung nodule 12/24/2022   Atherosclerosis of aorta (HCC) 11/02/2022   Right flank pain 08/17/2022   Left hip pain 02/23/2021   Low back pain 02/23/2021   Gait abnormality 02/23/2021   Bilateral lower extremity edema 01/22/2021   Elevated CK 10/12/2020   Spinal stenosis 10/09/2020   Lumbar adjacent segment disease with spondylolisthesis 10/09/2020   Spinal stenosis, lumbar region with neurogenic claudication 04/16/2020   Peripheral neuropathy 11/28/2019   Paresthesia and pain of both upper extremities 11/12/2019   Chronic midline low back pain with bilateral sciatica 10/30/2019   Left thigh pain 10/30/2019   Meralgia paresthetica, left 09/12/2019   Tommi Rumps Quervain's disease (tenosynovitis) 02/06/2019   Acute bilateral low back pain without sciatica 12/01/2018   Failed back syndrome of lumbar spine 11/25/2017   Inflammation of sacroiliac joint (HCC) 06/07/2017   Displacement of lumbar intervertebral disc 11/22/2016   Fatty liver 09/27/2016   Status post bilateral breast reduction 03/23/2016  Hypertrophy of breast 08/12/2015   Spondylolisthesis of lumbar region 06/13/2015   History of lumbar fusion 04/07/2015   Radiculopathy, lumbar region 04/07/2015   Essential hypertension 02/14/2015   Pelvic pain 12/02/2014   Postprandial vomiting 02/04/2014   Obesity (BMI 30-39.9) 11/01/2013   Carotid artery stenosis 06/07/2013   Hereditary and idiopathic peripheral neuropathy 06/07/2013   History of migraine 05/04/2013   Type 2 diabetes mellitus with other specified complication (HCC) 05/05/2011   SVT (supraventricular tachycardia) (HCC) 11/03/2010   GERD (gastroesophageal reflux disease)  10/29/2010   Chronic low back pain 10/29/2010   Hyperlipidemia associated with type 2 diabetes mellitus (HCC) 10/29/2010    PCP: Agapito Games, MD  REFERRING PROVIDER:  Novella Olive, FNP (encounter for medicare annual wellness exam)   Agapito Games, MD (other diagnoses)   REFERRING DIAG:  Z00.00 (ICD-10-CM) - Encounter for Medicare annual wellness exam   R07.9 (ICD-10-CM) - Right-sided chest pain  M89.8X1 (ICD-10-CM) - Clavicle pain  M54.50 (ICD-10-CM) - Acute bilateral low back pain without sciatica  S20.01XA (ICD-10-CM) - Contusion of right breast, initial encounter   Rationale for Evaluation and Treatment: Rehabilitation  THERAPY DIAG:  Other low back pain  Unsteadiness on feet  Repeated falls  Muscle weakness (generalized)  Abnormal posture  ONSET DATE: chronic    SUBJECTIVE:                                                                                                                                                                                           SUBJECTIVE STATEMENT: Patient reports no falls or loss of balance since last visit.   PERTINENT HISTORY:  Back surgery x 2 Breast surgery  Lumbar wound debridement  Rt shoulder surgery x 2  Spinal cord stimulator   PAIN:  Are you having pain? Yes: NPRS scale: none currently; at worst 8/10 Pain location: low back/posterior hips Pain description: sore Aggravating factors: household activity Relieving factors: rest  PRECAUTIONS:  Fall  RED FLAGS: None   WEIGHT BEARING RESTRICTIONS:  No  FALLS:  Has patient fallen in last 6 months? Yes. Number of falls 4; see subjective  LIVING ENVIRONMENT: Lives with: lives alone Lives in: House/apartment Stairs: No Has following equipment at home: Single point cane, Environmental consultant - 2 wheeled, shower chair, and Grab bars  OCCUPATION:  Retired   PLOF:  Independent  PATIENT GOALS:  "Be able to do things much better and work around the  house. No more falls."  NEXT MD VISIT: March 2025   OBJECTIVE:  Note: Objective measures were completed at Evaluation unless otherwise noted.  DIAGNOSTIC FINDINGS:  No recent imaging on  file   PATIENT SURVEYS:  ABC scale 43% self-confidence   05/05/23: 43% self-confidence  COGNITIVE STATUS: Within functional limits for tasks assessed   SENSATION: WFL   POSTURE:  rounded shoulders, forward head, and increased thoracic kyphosis   GAIT: Distance walked: 10 ft  Assistive device utilized: Single point cane Level of assistance: Modified independence Comments: decreased step length; limited push-off, narrow base; no arm swing, forward flexed posture    Body Part #1 Lumbar   LUMBAR ROM:   Active  A/PROM  eval  Flexion 50% limited  Extension 75% limited   Right lateral flexion WFL  Left lateral flexion WFL  Right rotation 50% limited  Left rotation 50% limited   (Blank rows = not tested); pain with all lumbar AROM   LOWER EXTREMITY MMT:    MMT Right eval Left eval 03/15/23 04/26/23 R/L 05/05/23   Hip flexion 3+ 3+  4/4 4+ bilateral   Hip extension       Hip abduction 3+ 3+  4/4- Lt: 4-; Rt: 4  Hip adduction       Hip internal rotation       Hip external rotation       Knee flexion 4 4  4+/4 Lt: 4; Rt: 4+  Knee extension 4 4  4+/4 Lt: 4; Rt: 5   Ankle dorsiflexion 5 5     Ankle plantarflexion Partial range DL calf raise Partial range DL calf raise Minimal range SL calf raise bilaterally  Minimal range SL calf raise bilaterally  Minimal range SL calf raise bilaterally   Ankle inversion       Ankle eversion 5 5      (Blank rows = not tested)    SPECIAL TESTS:  SLR (+)   FUNCTIONAL TESTS:  30 second chair test: 4 reps TUG: 24 seconds  03/15/23:  30 second chair test: 10 reps  TUG: 17.3 seconds     05/05/23 BERG BALANCE TEST Sitting to Standing: 4.      Stands without using hands and stabilize independently Standing Unsupported: 4.      Stands safely  for 2 minutes Sitting Unsupported: 4.     Sits for 2 minutes independently Standing to Sitting: 4.     Sits safely with minimal use of hands Transfers: 4.     Transfers safely with minor use of hands Standing with eyes closed: 3.     Stands 10 seconds with supervision Standing with feet together: 4.     Stands for 1 minute safely Reaching forward with outstretched arm: 3.     Reaches forward 5 inches Retrieving object from the floor: 4.      Able to pick up easily and safely Turning to look behind: 2.     Turns sideways only, maintains balance Turning 360 degrees: 4.     Able to turn in </=4 seconds  Place alternate foot on stool: 4.     Completes 8 steps in 20 seconds     Standing with one foot in front: 2.     Independent small step for 30 seconds Standing on one foot: 3.     Holds 5-10 seconds  Total Score: 49/56    [facility] Adult PT Treatment:  DATE: 05/12/2023 Therapeutic Exercise: Fwd walking + same --> opp knee taps 2x15' each Side stepping + 1# dowel chest press  Standing hip abd & ext + 3#AW 3x10 each (B) Marching + 3#AW x1', x 45", x30" Staggered stance squats 2x10 each leg (B) --> added 5#KB unilateral hold with tap on ground NuStep cool down L6 x   [facility] Adult PT Treatment:                                                DATE: 05/10/2023 Therapeutic Exercise: Fwd marching + same knee taps Bkwd walking STS from corner of table x10 --> cradling 4#DB x10 --> cradling 6#DB x10 Seated dead lift + 10#KB x10  Therapeutic Activity: Functional squatting activities: Standing squats + tap reach with block on floor Staggered stance squat + tap reach with block on floor Fingertip supported single leg balance 2x30" (B) Kickstand SLB + head turns SLB + UE support + head turns Standing on airex: Lateral weight shifting --> small marching Marching Placing cones from counter to top shelf  Squat + touch down on high --> mid  blocks   [facility] Adult PT Treatment:                                                DATE: 05/05/23 Therapeutic Exercise: Resisted knee extension 2  x 10 green band  SL calf raise 2 x 10  Standing hip extension 2 x 10  Updated HEP  Therapeutic Activity: Re-assessment to determine overall progress, educating patient on progress towards goals.    PATIENT EDUCATION:  Education details: HEP update; see treatment  Person educated: Patient Education method: Explanation, Demonstration, Tactile cues, Verbal cues, and Handouts Education comprehension: verbalized understanding, returned demonstration, verbal cues required, tactile cues required, and needs further education  HOME EXERCISE PROGRAM: Access Code: [email or web address] URL: [email or web address] Date: 05/12/2023 Prepared by: Carlynn Herald  Exercises - Supine Lower Trunk Rotation with PLB  - 1 x daily - 7 x weekly - 3 sets - 10 reps - Sit to Stand with Armchair  - 1 x daily - 7 x weekly - 2 sets - 10 reps - Seated Thoracic Self-Mobilization  - 1 x daily - 7 x weekly - 3 sets - 10 reps - 3 sec hold - Standing Bicep Curls Supinated with Dumbbells  - 1 x daily - 7 x weekly - 3 sets - 10 reps - Side Stepping with Counter Support  - 1 x daily - 7 x weekly - 2 sets - 10 reps - Standing Hip Abduction with Counter Support  - 1 x daily - 7 x weekly - 2 sets - 10 reps - Standing Hip Extension with Counter Support  - 1 x daily - 7 x weekly - 2 sets - 10 reps - Standing Romberg to 1/2 Tandem Stance  - 1 x daily - 7 x weekly - 3 sets - 30 sec  hold - Seated Knee Extension with Resistance  - 1 x daily - 7 x weekly - 2 sets - 10 reps - Single Leg Heel Raise with Counter Support  - 1 x daily - 7 x weekly - 2 sets - 10 reps - Staggered Stance Squat  -  1 x daily - 7 x weekly - 3 sets - 10 reps   ASSESSMENT:  CLINICAL IMPRESSION: Session focused functional LE strengthening in standing. Patient quick to fatigue and took standing breaks as needed to  recover. Cueing provided to slow pace and improve eccentric/concentric during standing functional exercises.    OBJECTIVE IMPAIRMENTS: Abnormal gait, decreased activity tolerance, decreased balance, decreased endurance, decreased knowledge of condition, decreased knowledge of use of DME, decreased mobility, difficulty walking, decreased ROM, decreased strength, decreased safety awareness, impaired flexibility, improper body mechanics, postural dysfunction, and pain.   ACTIVITY LIMITATIONS: carrying, lifting, bending, standing, squatting, stairs, transfers, and locomotion level  PARTICIPATION LIMITATIONS: meal prep, cleaning, laundry, shopping, community activity, and yard work  PERSONAL FACTORS: Age, Fitness, Time since onset of injury/illness/exacerbation, and 3+ comorbidities: see PMH/PSH above  are also affecting patient's functional outcome.   REHAB POTENTIAL: Fair see personal factors  CLINICAL DECISION MAKING: Evolving/moderate complexity  EVALUATION COMPLEXITY: Moderate   GOALS: Goals reviewed with patient? Yes  SHORT TERM GOALS: Target date: 03/24/2023  Patient will be independent and compliant with initial HEP.  Baseline: Goal status: MET  2.  Patient will complete at least 7 reps during 30 second chair rise to improve her functional strength.  Baseline: see above  Goal status: MET  3.  Patient will complete partial range SL calf raise to improve push-off during gait cycle.  Baseline: see above  Goal status: MET  4.  Patient will complete TUG in </=18 seconds to reduce her risk of falls.  Baseline: see above  Goal status: MET   LONG TERM GOALS: Target date: 06/18/23  Patient will score at least 53% self confidence on the ABC scale.  Baseline: 41.9% 05/05/23: 41% Goal status: in progress   2.  Patient will be independent with floor transfers. Baseline: assist from table to stand 05/04/22: requires cues for transfer.  Goal status: IN PROGRESS  3.  Patient will be  modified independent with curb negotiation.  Baseline: SPC Goal status: MET  4.  Patient will demonstrate at least 4/5 bilateral hip strength to improve stability about the chain with walking/standing activity.  Baseline: see above Goal status: partially met   5.  Patient will demonstrate 5/5 bilateral knee strength to improve stability with stair/curb negotiation.  Baseline: see above  Goal status: partially met   6.  Patient will score at least 45/56 on BERG balance to reduce her risk of falls.  Baseline: 49/56  Goal status: MET   PLAN:  PT FREQUENCY: 2x/week  PT DURATION: 6 weeks  PLANNED INTERVENTIONS: 97164- PT Re-evaluation, 97110-Therapeutic exercises, 97530- Therapeutic activity, 97112- Neuromuscular re-education, 97535- Self Care, 95284- Manual therapy, 97116- Gait training, Balance training, Stair training, Taping, Dry Needling, DME instructions, Cryotherapy, and Moist heat.  PLAN FOR NEXT SESSION: Generalized lower extremity strengthening (STANDING), Knee strengthening; balance on unstable surface, dynamic gait tasks. Work on floor transfers.   Carlynn Herald, PTA 05/12/23 9:28 AM

## 2023-05-16 ENCOUNTER — Telehealth: Payer: Self-pay | Admitting: Podiatry

## 2023-05-16 ENCOUNTER — Encounter: Payer: Self-pay | Admitting: Family Medicine

## 2023-05-16 NOTE — Progress Notes (Signed)
Hi Amanda Morris, both hips show some arthritis.  It does not appear to be severe at the point of needing any type of surgery.  We could consider formal physical therapy or if you want to discuss with your orthopedist.  No sign of fracture or dislocation.

## 2023-05-16 NOTE — Telephone Encounter (Signed)
Pt left message on 1/17 at 144pm calling about her appt on 1/24.   I returned call and it would not let me leave a message said call could not be completed at this time.

## 2023-05-19 ENCOUNTER — Encounter: Payer: Self-pay | Admitting: Podiatry

## 2023-05-19 ENCOUNTER — Ambulatory Visit: Payer: 59

## 2023-05-19 ENCOUNTER — Ambulatory Visit (INDEPENDENT_AMBULATORY_CARE_PROVIDER_SITE_OTHER): Payer: 59 | Admitting: Podiatry

## 2023-05-19 DIAGNOSIS — B351 Tinea unguium: Secondary | ICD-10-CM | POA: Diagnosis not present

## 2023-05-19 DIAGNOSIS — M6281 Muscle weakness (generalized): Secondary | ICD-10-CM | POA: Diagnosis not present

## 2023-05-19 DIAGNOSIS — M79674 Pain in right toe(s): Secondary | ICD-10-CM

## 2023-05-19 DIAGNOSIS — E1169 Type 2 diabetes mellitus with other specified complication: Secondary | ICD-10-CM

## 2023-05-19 DIAGNOSIS — R2681 Unsteadiness on feet: Secondary | ICD-10-CM

## 2023-05-19 DIAGNOSIS — R296 Repeated falls: Secondary | ICD-10-CM

## 2023-05-19 DIAGNOSIS — M79675 Pain in left toe(s): Secondary | ICD-10-CM

## 2023-05-19 DIAGNOSIS — R293 Abnormal posture: Secondary | ICD-10-CM

## 2023-05-19 DIAGNOSIS — M5459 Other low back pain: Secondary | ICD-10-CM

## 2023-05-19 DIAGNOSIS — L84 Corns and callosities: Secondary | ICD-10-CM

## 2023-05-19 DIAGNOSIS — E119 Type 2 diabetes mellitus without complications: Secondary | ICD-10-CM

## 2023-05-19 NOTE — Therapy (Signed)
OUTPATIENT PHYSICAL THERAPY TREATMENT    Patient Name: Amanda Morris MRN: [id number] DOB:[birth year], 73 y.o., female Today's Date: 05/19/2023  END OF SESSION:  PT End of Session - 05/19/23 0852     Visit Number 14    Number of Visits 23    Date for PT Re-Evaluation 06/18/23    Authorization Type [facility] MCR/MCD    Progress Note Due on Visit 20    PT Start Time [phone]   pt arrived late   PT Stop Time 0927    PT Time Calculation (min) 35 min    Activity Tolerance Patient tolerated treatment well    Behavior During Therapy WFL for tasks assessed/performed            Past Medical History:  Diagnosis Date   Allergy    Diabetes mellitus without complication (HCC)    type 2   GERD (gastroesophageal reflux disease)    Headache    History of hiatal hernia    Hypercholesterolemia    Numbness    left, outer thigh   Palpitations    in the past, no current issues per patient   Postlaminectomy syndrome    Past Surgical History:  Procedure Laterality Date   ABDOMINAL HYSTERECTOMY  1982   BACK SURGERY     x 2    BREAST REDUCTION SURGERY Bilateral 03/17/2016   Procedure: MAMMARY REDUCTION  (BREAST);  Surgeon: Peggye Form, DO;  Location: [facility];  Service: Plastics;  Laterality: Bilateral;   BREAST SURGERY     COLONOSCOPY     LUMBAR WOUND DEBRIDEMENT N/A 05/22/2020   Procedure: Incision and drainage of lumbar wound;  Surgeon: Coletta Memos, MD;  Location: [facility] OR;  Service: Neurosurgery;  Laterality: N/A;   SHOULDER SURGERY  06/2008, 09/2010   Right, by Dr. Anthoney Harada, then Dr. Bayard Beaver   SPINAL CORD STIMULATOR INSERTION N/A 12/02/2017   Procedure: LUMBAR SPINAL CORD STIMULATOR INSERTION;  Surgeon: Coletta Memos, MD;  Location: [facility] OR;  Service: Neurosurgery;  Laterality: N/A;   SPINAL CORD STIMULATOR TRIAL N/A 11/25/2017   Procedure: INSERTION LUMBAR SPINAL CORD STIMULATOR TRIAL  ;  Surgeon: Coletta Memos, MD;  Location: [facility] OR;  Service: Neurosurgery;   Laterality: N/A;   INSERTION LUMBAR SPINAL CORD STIMULATOR TRIAL     UPPER GI ENDOSCOPY     Patient Active Problem List   Diagnosis Date Noted   IBS (irritable bowel syndrome) 04/28/2023   Hiatal hernia 04/28/2023   Osteopenia after menopause 02/18/2023   Frequent falls 02/10/2023   Screening for osteoporosis 02/08/2023   Lung nodule 12/24/2022   Atherosclerosis of aorta (HCC) 11/02/2022   Right flank pain 08/17/2022   Left hip pain 02/23/2021   Low back pain 02/23/2021   Gait abnormality 02/23/2021   Bilateral lower extremity edema 01/22/2021   Elevated CK 10/12/2020   Spinal stenosis 10/09/2020   Lumbar adjacent segment disease with spondylolisthesis 10/09/2020   Spinal stenosis, lumbar region with neurogenic claudication 04/16/2020   Peripheral neuropathy 11/28/2019   Paresthesia and pain of both upper extremities 11/12/2019   Chronic midline low back pain with bilateral sciatica 10/30/2019   Left thigh pain 10/30/2019   Meralgia paresthetica, left 09/12/2019   Tommi Rumps Quervain's disease (tenosynovitis) 02/06/2019   Acute bilateral low back pain without sciatica 12/01/2018   Failed back syndrome of lumbar spine 11/25/2017   Inflammation of sacroiliac joint (HCC) 06/07/2017   Displacement of lumbar intervertebral disc 11/22/2016   Fatty liver 09/27/2016   Status post bilateral  breast reduction 03/23/2016   Hypertrophy of breast 08/12/2015   Spondylolisthesis of lumbar region 06/13/2015   History of lumbar fusion 04/07/2015   Radiculopathy, lumbar region 04/07/2015   Essential hypertension 02/14/2015   Pelvic pain 12/02/2014   Postprandial vomiting 02/04/2014   Obesity (BMI 30-39.9) 11/01/2013   Carotid artery stenosis 06/07/2013   Hereditary and idiopathic peripheral neuropathy 06/07/2013   History of migraine 05/04/2013   Type 2 diabetes mellitus with other specified complication (HCC) 05/05/2011   SVT (supraventricular tachycardia) (HCC) 11/03/2010   GERD  (gastroesophageal reflux disease) 10/29/2010   Chronic low back pain 10/29/2010   Hyperlipidemia associated with type 2 diabetes mellitus (HCC) 10/29/2010    PCP: Agapito Games, MD  REFERRING PROVIDER:  Novella Olive, FNP (encounter for medicare annual wellness exam)   Agapito Games, MD (other diagnoses)   REFERRING DIAG:  Z00.00 (ICD-10-CM) - Encounter for Medicare annual wellness exam   R07.9 (ICD-10-CM) - Right-sided chest pain  M89.8X1 (ICD-10-CM) - Clavicle pain  M54.50 (ICD-10-CM) - Acute bilateral low back pain without sciatica  S20.01XA (ICD-10-CM) - Contusion of right breast, initial encounter   Rationale for Evaluation and Treatment: Rehabilitation  THERAPY DIAG:  Other low back pain  Unsteadiness on feet  Repeated falls  Muscle weakness (generalized)  Abnormal posture  ONSET DATE: chronic    SUBJECTIVE:                                                                                                                                                                                           SUBJECTIVE STATEMENT: Patient arrived to the PT with a SPC, states she has it for balance in "case I step on a piece of ice". Patient reports she has not been to the gym with her nephew, says "it's too cold to be outside, I don't go outside in the snow". Patient states she is using resistant bands at home.   PERTINENT HISTORY:  Back surgery x 2 Breast surgery  Lumbar wound debridement  Rt shoulder surgery x 2  Spinal cord stimulator   PAIN:  Are you having pain? Yes: NPRS scale: none currently; at worst 8/10 Pain location: low back/posterior hips Pain description: sore Aggravating factors: household activity Relieving factors: rest  PRECAUTIONS:  Fall  RED FLAGS: None   WEIGHT BEARING RESTRICTIONS:  No  FALLS:  Has patient fallen in last 6 months? Yes. Number of falls 4; see subjective  LIVING ENVIRONMENT: Lives with: lives alone Lives in:  House/apartment Stairs: No Has following equipment at home: Single point cane, Environmental consultant - 2 wheeled, shower chair, and Grab bars  OCCUPATION:  Retired  PLOF:  Independent  PATIENT GOALS:  "Be able to do things much better and work around the house. No more falls."  NEXT MD VISIT: March 2025   OBJECTIVE:  Note: Objective measures were completed at Evaluation unless otherwise noted.  DIAGNOSTIC FINDINGS:  No recent imaging on file   PATIENT SURVEYS:  ABC scale 43% self-confidence   05/05/23: 43% self-confidence  COGNITIVE STATUS: Within functional limits for tasks assessed   SENSATION: WFL   POSTURE:  rounded shoulders, forward head, and increased thoracic kyphosis   GAIT: Distance walked: 10 ft  Assistive device utilized: Single point cane Level of assistance: Modified independence Comments: decreased step length; limited push-off, narrow base; no arm swing, forward flexed posture    Body Part #1 Lumbar   LUMBAR ROM:   Active  A/PROM  eval  Flexion 50% limited  Extension 75% limited   Right lateral flexion WFL  Left lateral flexion WFL  Right rotation 50% limited  Left rotation 50% limited   (Blank rows = not tested); pain with all lumbar AROM   LOWER EXTREMITY MMT:    MMT Right eval Left eval 03/15/23 04/26/23 R/L 05/05/23   Hip flexion 3+ 3+  4/4 4+ bilateral   Hip extension       Hip abduction 3+ 3+  4/4- Lt: 4-; Rt: 4  Hip adduction       Hip internal rotation       Hip external rotation       Knee flexion 4 4  4+/4 Lt: 4; Rt: 4+  Knee extension 4 4  4+/4 Lt: 4; Rt: 5   Ankle dorsiflexion 5 5     Ankle plantarflexion Partial range DL calf raise Partial range DL calf raise Minimal range SL calf raise bilaterally  Minimal range SL calf raise bilaterally  Minimal range SL calf raise bilaterally   Ankle inversion       Ankle eversion 5 5      (Blank rows = not tested)    SPECIAL TESTS:  SLR (+)   FUNCTIONAL TESTS:  30 second chair test: 4  reps TUG: 24 seconds  03/15/23:  30 second chair test: 10 reps  TUG: 17.3 seconds     05/05/23 BERG BALANCE TEST Sitting to Standing: 4.      Stands without using hands and stabilize independently Standing Unsupported: 4.      Stands safely for 2 minutes Sitting Unsupported: 4.     Sits for 2 minutes independently Standing to Sitting: 4.     Sits safely with minimal use of hands Transfers: 4.     Transfers safely with minor use of hands Standing with eyes closed: 3.     Stands 10 seconds with supervision Standing with feet together: 4.     Stands for 1 minute safely Reaching forward with outstretched arm: 3.     Reaches forward 5 inches Retrieving object from the floor: 4.      Able to pick up easily and safely Turning to look behind: 2.     Turns sideways only, maintains balance Turning 360 degrees: 4.     Able to turn in </=4 seconds  Place alternate foot on stool: 4.     Completes 8 steps in 20 seconds     Standing with one foot in front: 2.     Independent small step for 30 seconds Standing on one foot: 3.     Holds 5-10 seconds  Total Score: 49/56  [facility]  Adult PT Treatment:                                                DATE: 05/19/2023 Therapeutic Exercise: Standing hip abd & ext x10 each (B) Heel raises x20 Marching 3x30" Butt kickers 3x30" Staggered stance weigh shifting (active rest break) Resisted side stepping + RTB crossed at ankles (counter for support as needed) Wide stance lateral weight shifting (active rest break)  SLB 3x30" each leg --> fingertip support Leg press: DL 13# Y86, 57# Q46 Walking with adjusted [place]    [facility] Adult PT Treatment:                                                DATE: 05/12/2023 Therapeutic Exercise: Fwd walking + same --> opp knee taps 2x15' each Side stepping + 1# dowel chest press  Standing hip abd & ext + 3#AW 3x10 each (B) Marching + 3#AW x1', x 45", x30" Staggered stance squats 2x10 each leg (B) --> added 5#KB unilateral hold  with tap on ground NuStep cool down L6 x   [facility] Adult PT Treatment:                                                DATE: 05/10/2023 Therapeutic Exercise: Fwd marching + same knee taps Bkwd walking STS from corner of table x10 --> cradling 4#DB x10 --> cradling 6#DB x10 Seated dead lift + 10#KB x10  Therapeutic Activity: Functional squatting activities: Standing squats + tap reach with block on floor Staggered stance squat + tap reach with block on floor Fingertip supported single leg balance 2x30" (B) Kickstand SLB + head turns SLB + UE support + head turns Standing on airex: Lateral weight shifting --> small marching Marching Placing cones from counter to top shelf  Squat + touch down on high --> mid blocks     PATIENT EDUCATION:  Education details: HEP update; see treatment  Person educated: Patient Education method: Explanation, Demonstration, Tactile cues, Verbal cues, and Handouts Education comprehension: verbalized understanding, returned demonstration, verbal cues required, tactile cues required, and needs further education  HOME EXERCISE PROGRAM: Access Code: [email or web address] URL: [email or web address] Date: 05/12/2023 Prepared by: Carlynn Herald  Exercises - Supine Lower Trunk Rotation with PLB  - 1 x daily - 7 x weekly - 3 sets - 10 reps - Sit to Stand with Armchair  - 1 x daily - 7 x weekly - 2 sets - 10 reps - Seated Thoracic Self-Mobilization  - 1 x daily - 7 x weekly - 3 sets - 10 reps - 3 sec hold - Standing Bicep Curls Supinated with Dumbbells  - 1 x daily - 7 x weekly - 3 sets - 10 reps - Side Stepping with Counter Support  - 1 x daily - 7 x weekly - 2 sets - 10 reps - Standing Hip Abduction with Counter Support  - 1 x daily - 7 x weekly - 2 sets - 10 reps - Standing Hip Extension with Counter Support  - 1 x daily - 7 x weekly - 2 sets -  10 reps - Standing Romberg to 1/2 Tandem Stance  - 1 x daily - 7 x weekly - 3 sets - 30 sec  hold - Seated  Knee Extension with Resistance  - 1 x daily - 7 x weekly - 2 sets - 10 reps - Single Leg Heel Raise with Counter Support  - 1 x daily - 7 x weekly - 2 sets - 10 reps - Staggered Stance Squat  - 1 x daily - 7 x weekly - 3 sets - 10 reps   ASSESSMENT:  CLINICAL IMPRESSION: Continued with focus on standing exercises for functional LE strengthening and balance. Patient cued to take active rest breaks in standing to progress endurance and tolerance to prolonged standing and physical activity. Leg press incorporated to progress LE strength and and orient patient on machine prior to return to gym.    OBJECTIVE IMPAIRMENTS: Abnormal gait, decreased activity tolerance, decreased balance, decreased endurance, decreased knowledge of condition, decreased knowledge of use of DME, decreased mobility, difficulty walking, decreased ROM, decreased strength, decreased safety awareness, impaired flexibility, improper body mechanics, postural dysfunction, and pain.   ACTIVITY LIMITATIONS: carrying, lifting, bending, standing, squatting, stairs, transfers, and locomotion level  PARTICIPATION LIMITATIONS: meal prep, cleaning, laundry, shopping, community activity, and yard work  PERSONAL FACTORS: Age, Fitness, Time since onset of injury/illness/exacerbation, and 3+ comorbidities: see PMH/PSH above  are also affecting patient's functional outcome.   REHAB POTENTIAL: Fair see personal factors  CLINICAL DECISION MAKING: Evolving/moderate complexity  EVALUATION COMPLEXITY: Moderate   GOALS: Goals reviewed with patient? Yes  SHORT TERM GOALS: Target date: 03/24/2023  Patient will be independent and compliant with initial HEP.  Baseline: Goal status: MET  2.  Patient will complete at least 7 reps during 30 second chair rise to improve her functional strength.  Baseline: see above  Goal status: MET  3.  Patient will complete partial range SL calf raise to improve push-off during gait cycle.  Baseline: see  above  Goal status: MET  4.  Patient will complete TUG in </=18 seconds to reduce her risk of falls.  Baseline: see above  Goal status: MET   LONG TERM GOALS: Target date: 06/18/23  Patient will score at least 53% self confidence on the ABC scale.  Baseline: 41.9% 05/05/23: 41% Goal status: in progress   2.  Patient will be independent with floor transfers. Baseline: assist from table to stand 05/04/22: requires cues for transfer.  Goal status: IN PROGRESS  3.  Patient will be modified independent with curb negotiation.  Baseline: SPC Goal status: MET  4.  Patient will demonstrate at least 4/5 bilateral hip strength to improve stability about the chain with walking/standing activity.  Baseline: see above Goal status: partially met   5.  Patient will demonstrate 5/5 bilateral knee strength to improve stability with stair/curb negotiation.  Baseline: see above  Goal status: partially met   6.  Patient will score at least 45/56 on BERG balance to reduce her risk of falls.  Baseline: 49/56  Goal status: MET   PLAN:  PT FREQUENCY: 2x/week  PT DURATION: 6 weeks  PLANNED INTERVENTIONS: 97164- PT Re-evaluation, 97110-Therapeutic exercises, 97530- Therapeutic activity, 97112- Neuromuscular re-education, 97535- Self Care, 29528- Manual therapy, 97116- Gait training, Balance training, Stair training, Taping, Dry Needling, DME instructions, Cryotherapy, and Moist heat.  PLAN FOR NEXT SESSION: Generalized lower extremity strengthening (STANDING), Knee strengthening; balance on unstable surface, dynamic gait tasks. Work on floor transfers.   Carlynn Herald, PTA 05/19/23 9:27 AM
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
## 2023-05-20 ENCOUNTER — Ambulatory Visit: Payer: 59 | Admitting: Podiatry

## 2023-05-23 ENCOUNTER — Telehealth: Payer: Self-pay

## 2023-05-23 NOTE — Telephone Encounter (Signed)
PAP: Application for Ozempic has been submitted to [employer], online  Please not I have faxed the provider portion of the application to Dr. Nani Gasser

## 2023-05-24 ENCOUNTER — Other Ambulatory Visit: Payer: Self-pay | Admitting: Family Medicine

## 2023-05-24 ENCOUNTER — Ambulatory Visit: Payer: 59

## 2023-05-24 DIAGNOSIS — R293 Abnormal posture: Secondary | ICD-10-CM | POA: Diagnosis not present

## 2023-05-24 DIAGNOSIS — M898X1 Other specified disorders of bone, shoulder: Secondary | ICD-10-CM

## 2023-05-24 DIAGNOSIS — R2681 Unsteadiness on feet: Secondary | ICD-10-CM

## 2023-05-24 DIAGNOSIS — R079 Chest pain, unspecified: Secondary | ICD-10-CM

## 2023-05-24 DIAGNOSIS — M5459 Other low back pain: Secondary | ICD-10-CM | POA: Diagnosis not present

## 2023-05-24 DIAGNOSIS — R296 Repeated falls: Secondary | ICD-10-CM | POA: Diagnosis not present

## 2023-05-24 DIAGNOSIS — M6281 Muscle weakness (generalized): Secondary | ICD-10-CM | POA: Diagnosis not present

## 2023-05-24 DIAGNOSIS — K2100 Gastro-esophageal reflux disease with esophagitis, without bleeding: Secondary | ICD-10-CM

## 2023-05-24 DIAGNOSIS — E1169 Type 2 diabetes mellitus with other specified complication: Secondary | ICD-10-CM

## 2023-05-24 DIAGNOSIS — G6289 Other specified polyneuropathies: Secondary | ICD-10-CM

## 2023-05-24 NOTE — Therapy (Signed)
OUTPATIENT PHYSICAL THERAPY TREATMENT    Patient Name: Amanda Morris MRN: [id number] DOB:03/31/1951, 73 y.o., female Today's Date: 05/24/2023  END OF SESSION:  PT End of Session - 05/24/23 0849     Visit Number 15    Number of Visits 23    Date for PT Re-Evaluation 06/18/23    Authorization Type [employer] MCR/MCD    Progress Note Due on Visit 20    PT Start Time 0850    PT Stop Time 0928    PT Time Calculation (min) 38 min    Activity Tolerance Patient tolerated treatment well    Behavior During Therapy WFL for tasks assessed/performed            Past Medical History:  Diagnosis Date   Allergy    Diabetes mellitus without complication (HCC)    type 2   GERD (gastroesophageal reflux disease)    Headache    History of hiatal hernia    Hypercholesterolemia    Numbness    left, outer thigh   Palpitations    in the past, no current issues per patient   Postlaminectomy syndrome    Past Surgical History:  Procedure Laterality Date   ABDOMINAL HYSTERECTOMY  1982   BACK SURGERY     x 2    BREAST REDUCTION SURGERY Bilateral 03/17/2016   Procedure: MAMMARY REDUCTION  (BREAST);  Surgeon: Peggye Form, DO;  Location: [facility];  Service: Plastics;  Laterality: Bilateral;   BREAST SURGERY     COLONOSCOPY     LUMBAR WOUND DEBRIDEMENT N/A 05/22/2020   Procedure: Incision and drainage of lumbar wound;  Surgeon: Coletta Memos, MD;  Location: [facility];  Service: Neurosurgery;  Laterality: N/A;   SHOULDER SURGERY  06/2008, 09/2010   Right, by Dr. Anthoney Harada, then Dr. Bayard Beaver   SPINAL CORD STIMULATOR INSERTION N/A 12/02/2017   Procedure: LUMBAR SPINAL CORD STIMULATOR INSERTION;  Surgeon: Coletta Memos, MD;  Location: [facility] OR;  Service: Neurosurgery;  Laterality: N/A;   SPINAL CORD STIMULATOR TRIAL N/A 11/25/2017   Procedure: INSERTION LUMBAR SPINAL CORD STIMULATOR TRIAL  ;  Surgeon: Coletta Memos, MD;  Location: [facility] OR;  Service: Neurosurgery;  Laterality: N/A;    INSERTION LUMBAR SPINAL CORD STIMULATOR TRIAL     UPPER GI ENDOSCOPY     Patient Active Problem List   Diagnosis Date Noted   IBS (irritable bowel syndrome) 04/28/2023   Hiatal hernia 04/28/2023   Osteopenia after menopause 02/18/2023   Frequent falls 02/10/2023   Screening for osteoporosis 02/08/2023   Lung nodule 12/24/2022   Atherosclerosis of aorta (HCC) 11/02/2022   Right flank pain 08/17/2022   Left hip pain 02/23/2021   Low back pain 02/23/2021   Gait abnormality 02/23/2021   Bilateral lower extremity edema 01/22/2021   Elevated CK 10/12/2020   Spinal stenosis 10/09/2020   Lumbar adjacent segment disease with spondylolisthesis 10/09/2020   Spinal stenosis, lumbar region with neurogenic claudication 04/16/2020   Peripheral neuropathy 11/28/2019   Paresthesia and pain of both upper extremities 11/12/2019   Chronic midline low back pain with bilateral sciatica 10/30/2019   Left thigh pain 10/30/2019   Meralgia paresthetica, left 09/12/2019   Tommi Rumps Quervain's disease (tenosynovitis) 02/06/2019   Acute bilateral low back pain without sciatica 12/01/2018   Failed back syndrome of lumbar spine 11/25/2017   Inflammation of sacroiliac joint (HCC) 06/07/2017   Displacement of lumbar intervertebral disc 11/22/2016   Fatty liver 09/27/2016   Status post bilateral breast reduction 03/23/2016  Hypertrophy of breast 08/12/2015   Spondylolisthesis of lumbar region 06/13/2015   History of lumbar fusion 04/07/2015   Radiculopathy, lumbar region 04/07/2015   Essential hypertension 02/14/2015   Pelvic pain 12/02/2014   Postprandial vomiting 02/04/2014   Obesity (BMI 30-39.9) 11/01/2013   Carotid artery stenosis 06/07/2013   Hereditary and idiopathic peripheral neuropathy 06/07/2013   History of migraine 05/04/2013   Type 2 diabetes mellitus with other specified complication (HCC) 05/05/2011   SVT (supraventricular tachycardia) (HCC) 11/03/2010   GERD (gastroesophageal reflux disease)  10/29/2010   Chronic low back pain 10/29/2010   Hyperlipidemia associated with type 2 diabetes mellitus (HCC) 10/29/2010    PCP: Agapito Games, MD  REFERRING PROVIDER:  Novella Olive, FNP (encounter for medicare annual wellness exam)   Agapito Games, MD (other diagnoses)   REFERRING DIAG:  Z00.00 (ICD-10-CM) - Encounter for Medicare annual wellness exam   R07.9 (ICD-10-CM) - Right-sided chest pain  M89.8X1 (ICD-10-CM) - Clavicle pain  M54.50 (ICD-10-CM) - Acute bilateral low back pain without sciatica  S20.01XA (ICD-10-CM) - Contusion of right breast, initial encounter   Rationale for Evaluation and Treatment: Rehabilitation  THERAPY DIAG:  Other low back pain  Unsteadiness on feet  Repeated falls  Muscle weakness (generalized)  Abnormal posture  ONSET DATE: chronic    SUBJECTIVE:                                                                                                                                                                                           SUBJECTIVE STATEMENT: Patient reports she felt fine after last PT visit; denies any falls or LOB while dressing.  PERTINENT HISTORY:  Back surgery x 2 Breast surgery  Lumbar wound debridement  Rt shoulder surgery x 2  Spinal cord stimulator   PAIN:  Are you having pain? Yes: NPRS scale: none currently; at worst 8/10 Pain location: low back/posterior hips Pain description: sore Aggravating factors: household activity Relieving factors: rest  PRECAUTIONS:  Fall  RED FLAGS: None   WEIGHT BEARING RESTRICTIONS:  No  FALLS:  Has patient fallen in last 6 months? Yes. Number of falls 4; see subjective  LIVING ENVIRONMENT: Lives with: lives alone Lives in: House/apartment Stairs: No Has following equipment at home: Single point cane, Environmental consultant - 2 wheeled, shower chair, and Grab bars  OCCUPATION:  Retired   PLOF:  Independent  PATIENT GOALS:  "Be able to do things much  better and work around the house. No more falls."  NEXT MD VISIT: March 2025   OBJECTIVE:  Note: Objective measures were completed at Evaluation unless otherwise noted.  DIAGNOSTIC FINDINGS:  No recent imaging on file   PATIENT SURVEYS:  ABC scale 43% self-confidence   05/05/23: 43% self-confidence  COGNITIVE STATUS: Within functional limits for tasks assessed   SENSATION: WFL   POSTURE:  rounded shoulders, forward head, and increased thoracic kyphosis   GAIT: Distance walked: 10 ft  Assistive device utilized: Single point cane Level of assistance: Modified independence Comments: decreased step length; limited push-off, narrow base; no arm swing, forward flexed posture    Body Part #1 Lumbar   LUMBAR ROM:   Active  A/PROM  eval  Flexion 50% limited  Extension 75% limited   Right lateral flexion WFL  Left lateral flexion WFL  Right rotation 50% limited  Left rotation 50% limited   (Blank rows = not tested); pain with all lumbar AROM   LOWER EXTREMITY MMT:    MMT Right eval Left eval 03/15/23 04/26/23 R/L 05/05/23   Hip flexion 3+ 3+  4/4 4+ bilateral   Hip extension       Hip abduction 3+ 3+  4/4- Lt: 4-; Rt: 4  Hip adduction       Hip internal rotation       Hip external rotation       Knee flexion 4 4  4+/4 Lt: 4; Rt: 4+  Knee extension 4 4  4+/4 Lt: 4; Rt: 5   Ankle dorsiflexion 5 5     Ankle plantarflexion Partial range DL calf raise Partial range DL calf raise Minimal range SL calf raise bilaterally  Minimal range SL calf raise bilaterally  Minimal range SL calf raise bilaterally   Ankle inversion       Ankle eversion 5 5      (Blank rows = not tested)    SPECIAL TESTS:  SLR (+)   FUNCTIONAL TESTS:  30 second chair test: 4 reps TUG: 24 seconds  03/15/23:  30 second chair test: 10 reps  TUG: 17.3 seconds     05/05/23 BERG BALANCE TEST Sitting to Standing: 4.      Stands without using hands and stabilize independently Standing  Unsupported: 4.      Stands safely for 2 minutes Sitting Unsupported: 4.     Sits for 2 minutes independently Standing to Sitting: 4.     Sits safely with minimal use of hands Transfers: 4.     Transfers safely with minor use of hands Standing with eyes closed: 3.     Stands 10 seconds with supervision Standing with feet together: 4.     Stands for 1 minute safely Reaching forward with outstretched arm: 3.     Reaches forward 5 inches Retrieving object from the floor: 4.      Able to pick up easily and safely Turning to look behind: 2.     Turns sideways only, maintains balance Turning 360 degrees: 4.     Able to turn in </=4 seconds  Place alternate foot on stool: 4.     Completes 8 steps in 20 seconds     Standing with one foot in front: 2.     Independent small step for 30 seconds Standing on one foot: 3.     Holds 5-10 seconds  Total Score: 49/56   [facility] Adult PT Treatment:  DATE: 05/24/2023 Therapeutic Exercise: Standing: Marching x20 --> repeated with 2#AW Butt kickers x20 --> repeated with 2#AW Heel raises x20 Alt toe raises x20 Hip abd 2x15 + 2#AW Hip ext 2x15 + 2#AW Seated LAQ + 4#AW 2x12 SLB 3x30" (B): kickstand stance --> fingertip support x2 --> HHAx1  Staggered stance lunge with knee tap down on raised pad + HHAx2 (counter & chair) 2x8 (B) Runner's lunge stretch at counter (B)   [facility] Adult PT Treatment:                                                DATE: 05/19/2023 Therapeutic Exercise: Standing hip abd & ext x10 each (B) Heel raises x20 Marching 3x30" Butt kickers 3x30" Staggered stance weight shifting (active rest break) Resisted side stepping + RTB crossed at ankles (counter for support as needed) Wide stance lateral weight shifting (active rest break)  SLB 3x30" each leg --> fingertip support Leg press: DL 16# X09, 60# A54 Walking with adjusted [facility]    [facility] Adult PT Treatment:                                                 DATE: 05/12/2023 Therapeutic Exercise: Fwd walking + same --> opp knee taps 2x15' each Side stepping + 1# dowel chest press  Standing hip abd & ext + 3#AW 3x10 each (B) Marching + 3#AW x1', x 45", x30" Staggered stance squats 2x10 each leg (B) --> added 5#KB unilateral hold with tap on ground NuStep cool down L6 x   [facility] Adult PT Treatment:                                                DATE: 05/10/2023 Therapeutic Exercise: Fwd marching + same knee taps Bkwd walking STS from corner of table x10 --> cradling 4#DB x10 --> cradling 6#DB x10 Seated dead lift + 10#KB x10  Therapeutic Activity: Functional squatting activities: Standing squats + tap reach with block on floor Staggered stance squat + tap reach with block on floor Fingertip supported single leg balance 2x30" (B) Kickstand SLB + head turns SLB + UE support + head turns Standing on airex: Lateral weight shifting --> small marching Marching Placing cones from counter to top shelf  Squat + touch down on high --> mid blocks     PATIENT EDUCATION:  Education details: HEP update; see treatment  Person educated: Patient Education method: Explanation, Demonstration, Tactile cues, Verbal cues, and Handouts Education comprehension: verbalized understanding, returned demonstration, verbal cues required, tactile cues required, and needs further education  HOME EXERCISE PROGRAM: Access Code: [email or web address] URL: [email or web address] Date: 05/12/2023 Prepared by: Carlynn Herald  Exercises - Supine Lower Trunk Rotation with PLB  - 1 x daily - 7 x weekly - 3 sets - 10 reps - Sit to Stand with Armchair  - 1 x daily - 7 x weekly - 2 sets - 10 reps - Seated Thoracic Self-Mobilization  - 1 x daily - 7 x weekly - 3 sets - 10 reps - 3 sec hold - Standing Bicep Curls Supinated with Dumbbells  -  1 x daily - 7 x weekly - 3 sets - 10 reps - Side Stepping with Counter Support  - 1 x daily - 7 x weekly - 2 sets - 10  reps - Standing Hip Abduction with Counter Support  - 1 x daily - 7 x weekly - 2 sets - 10 reps - Standing Hip Extension with Counter Support  - 1 x daily - 7 x weekly - 2 sets - 10 reps - Standing Romberg to 1/2 Tandem Stance  - 1 x daily - 7 x weekly - 3 sets - 30 sec  hold - Seated Knee Extension with Resistance  - 1 x daily - 7 x weekly - 2 sets - 10 reps - Single Leg Heel Raise with Counter Support  - 1 x daily - 7 x weekly - 2 sets - 10 reps - Staggered Stance Squat  - 1 x daily - 7 x weekly - 3 sets - 10 reps   ASSESSMENT:  CLINICAL IMPRESSION: Focus on standing exercises continued, progressing with added resistance to patient's tolerance. Staggered stance lunge variations added to progress floor to standing transfers and functional LE strength.   OBJECTIVE IMPAIRMENTS: Abnormal gait, decreased activity tolerance, decreased balance, decreased endurance, decreased knowledge of condition, decreased knowledge of use of DME, decreased mobility, difficulty walking, decreased ROM, decreased strength, decreased safety awareness, impaired flexibility, improper body mechanics, postural dysfunction, and pain.   ACTIVITY LIMITATIONS: carrying, lifting, bending, standing, squatting, stairs, transfers, and locomotion level  PARTICIPATION LIMITATIONS: meal prep, cleaning, laundry, shopping, community activity, and yard work  PERSONAL FACTORS: Age, Fitness, Time since onset of injury/illness/exacerbation, and 3+ comorbidities: see PMH/PSH above  are also affecting patient's functional outcome.   REHAB POTENTIAL: Fair see personal factors  CLINICAL DECISION MAKING: Evolving/moderate complexity  EVALUATION COMPLEXITY: Moderate   GOALS: Goals reviewed with patient? Yes  SHORT TERM GOALS: Target date: 03/24/2023  Patient will be independent and compliant with initial HEP.  Baseline: Goal status: MET  2.  Patient will complete at least 7 reps during 30 second chair rise to improve her  functional strength.  Baseline: see above  Goal status: MET  3.  Patient will complete partial range SL calf raise to improve push-off during gait cycle.  Baseline: see above  Goal status: MET  4.  Patient will complete TUG in </=18 seconds to reduce her risk of falls.  Baseline: see above  Goal status: MET   LONG TERM GOALS: Target date: 06/18/23  Patient will score at least 53% self confidence on the ABC scale.  Baseline: 41.9% 05/05/23: 41% Goal status: in progress   2.  Patient will be independent with floor transfers. Baseline: assist from table to stand 05/04/22: requires cues for transfer.  Goal status: IN PROGRESS  3.  Patient will be modified independent with curb negotiation.  Baseline: SPC Goal status: MET  4.  Patient will demonstrate at least 4/5 bilateral hip strength to improve stability about the chain with walking/standing activity.  Baseline: see above Goal status: partially met   5.  Patient will demonstrate 5/5 bilateral knee strength to improve stability with stair/curb negotiation.  Baseline: see above  Goal status: partially met   6.  Patient will score at least 45/56 on BERG balance to reduce her risk of falls.  Baseline: 49/56  Goal status: MET   PLAN:  PT FREQUENCY: 2x/week  PT DURATION: 6 weeks  PLANNED INTERVENTIONS: 97164- PT Re-evaluation, 97110-Therapeutic exercises, 97530- Therapeutic activity, [id number]- Neuromuscular re-education, 97535-  Self Care, 16109- Manual therapy, [phone]- Gait training, Balance training, Stair training, Taping, Dry Needling, DME instructions, Cryotherapy, and Moist heat.  PLAN FOR NEXT SESSION: Generalized lower extremity strengthening (STANDING), Knee strengthening; balance on unstable surface, dynamic gait tasks. Work on floor transfers.   Carlynn Herald, PTA 05/24/23 9:29 AM

## 2023-05-25 DIAGNOSIS — G603 Idiopathic progressive neuropathy: Secondary | ICD-10-CM | POA: Diagnosis not present

## 2023-05-26 ENCOUNTER — Ambulatory Visit: Payer: 59

## 2023-05-26 ENCOUNTER — Other Ambulatory Visit: Payer: Self-pay | Admitting: Family Medicine

## 2023-05-26 DIAGNOSIS — M6281 Muscle weakness (generalized): Secondary | ICD-10-CM | POA: Diagnosis not present

## 2023-05-26 DIAGNOSIS — R293 Abnormal posture: Secondary | ICD-10-CM | POA: Diagnosis not present

## 2023-05-26 DIAGNOSIS — R296 Repeated falls: Secondary | ICD-10-CM | POA: Diagnosis not present

## 2023-05-26 DIAGNOSIS — R2681 Unsteadiness on feet: Secondary | ICD-10-CM

## 2023-05-26 DIAGNOSIS — M5459 Other low back pain: Secondary | ICD-10-CM

## 2023-05-26 NOTE — Telephone Encounter (Signed)
Received provider portion of PAP application, faxed completed application with a copy of the patient's insurance card to [employer]

## 2023-05-26 NOTE — Therapy (Addendum)
 OUTPATIENT PHYSICAL THERAPY TREATMENT  PHYSICAL THERAPY DISCHARGE SUMMARY  Visits from Start of Care: 16  Current functional level related to goals / functional outcomes: See goals below   Remaining deficits: Status unknown   Education / Equipment: N/A   Patient agrees to discharge. Patient goals were partially met. Patient is being discharged due to not returning since the last visit.   Patient Name: Amanda Morris MRN: [id number] DOB:1950-12-30, 73 y.o., female Today's Date: 05/26/2023  END OF SESSION:  PT End of Session - 05/26/23 0759     Visit Number 16    Number of Visits 23    Date for PT Re-Evaluation 06/18/23    Authorization Type U[employer] MCR/MCD    Progress Note Due on Visit 20    PT Start Time 0759    PT Stop Time [phone]    PT Time Calculation (min) 44 min    Equipment Utilized During Treatment Gait belt    Activity Tolerance Patient tolerated treatment well    Behavior During Therapy WFL for tasks assessed/performed             Past Medical History:  Diagnosis Date   Allergy    Diabetes mellitus without complication (HCC)    type 2   GERD (gastroesophageal reflux disease)    Headache    History of hiatal hernia    Hypercholesterolemia    Numbness    left, outer thigh   Palpitations    in the past, no current issues per patient   Postlaminectomy syndrome    Past Surgical History:  Procedure Laterality Date   ABDOMINAL HYSTERECTOMY  1982   BACK SURGERY     x 2    BREAST REDUCTION SURGERY Bilateral 03/17/2016   Procedure: MAMMARY REDUCTION  (BREAST);  Surgeon: Thornell Flirt, DO;  Location: [facility];  Service: Plastics;  Laterality: Bilateral;   BREAST SURGERY     COLONOSCOPY     LUMBAR WOUND DEBRIDEMENT N/A 05/22/2020   Procedure: Incision and drainage of lumbar wound;  Surgeon: Audie Bleacher, MD;  Location: [facility] OR;  Service: Neurosurgery;  Laterality: N/A;   SHOULDER SURGERY  06/2008, 09/2010   Right, by Dr. Lindell Revel,  then Dr. Maryan Smalling   SPINAL CORD STIMULATOR INSERTION N/A 12/02/2017   Procedure: LUMBAR SPINAL CORD STIMULATOR INSERTION;  Surgeon: Audie Bleacher, MD;  Location: [facility] OR;  Service: Neurosurgery;  Laterality: N/A;   SPINAL CORD STIMULATOR TRIAL N/A 11/25/2017   Procedure: INSERTION LUMBAR SPINAL CORD STIMULATOR TRIAL  ;  Surgeon: Audie Bleacher, MD;  Location: [facility] OR;  Service: Neurosurgery;  Laterality: N/A;   INSERTION LUMBAR SPINAL CORD STIMULATOR TRIAL     UPPER GI ENDOSCOPY     Patient Active Problem List   Diagnosis Date Noted   IBS (irritable bowel syndrome) 04/28/2023   Hiatal hernia 04/28/2023   Osteopenia after menopause 02/18/2023   Frequent falls 02/10/2023   Screening for osteoporosis 02/08/2023   Lung nodule 12/24/2022   Atherosclerosis of aorta (HCC) 11/02/2022   Right flank pain 08/17/2022   Left hip pain 02/23/2021   Low back pain 02/23/2021   Gait abnormality 02/23/2021   Bilateral lower extremity edema 01/22/2021   Elevated CK 10/12/2020   Spinal stenosis 10/09/2020   Lumbar adjacent segment disease with spondylolisthesis 10/09/2020   Spinal stenosis, lumbar region with neurogenic claudication 04/16/2020   Peripheral neuropathy 11/28/2019   Paresthesia and pain of both upper extremities 11/12/2019   Chronic midline low back pain with bilateral sciatica  10/30/2019   Left thigh pain 10/30/2019   Meralgia paresthetica, left 09/12/2019   Eddie Good Quervain's disease (tenosynovitis) 02/06/2019   Acute bilateral low back pain without sciatica 12/01/2018   Failed back syndrome of lumbar spine 11/25/2017   Inflammation of sacroiliac joint (HCC) 06/07/2017   Displacement of lumbar intervertebral disc 11/22/2016   Fatty liver 09/27/2016   Status post bilateral breast reduction 03/23/2016   Hypertrophy of breast 08/12/2015   Spondylolisthesis of lumbar region 06/13/2015   History of lumbar fusion 04/07/2015   Radiculopathy, lumbar region 04/07/2015   Essential hypertension  02/14/2015   Pelvic pain 12/02/2014   Postprandial vomiting 02/04/2014   Obesity (BMI 30-39.9) 11/01/2013   Carotid artery stenosis 06/07/2013   Hereditary and idiopathic peripheral neuropathy 06/07/2013   History of migraine 05/04/2013   Type 2 diabetes mellitus with other specified complication (HCC) 05/05/2011   SVT (supraventricular tachycardia) (HCC) 11/03/2010   GERD (gastroesophageal reflux disease) 10/29/2010   Chronic low back pain 10/29/2010   Hyperlipidemia associated with type 2 diabetes mellitus (HCC) 10/29/2010    PCP: Cydney Draft, MD  REFERRING PROVIDER:  Mickiel Albany, FNP (encounter for medicare annual wellness exam)   Cydney Draft, MD (other diagnoses)   REFERRING DIAG:  Z00.00 (ICD-10-CM) - Encounter for Medicare annual wellness exam   R07.9 (ICD-10-CM) - Right-sided chest pain  M89.8X1 (ICD-10-CM) - Clavicle pain  M54.50 (ICD-10-CM) - Acute bilateral low back pain without sciatica  S20.01XA (ICD-10-CM) - Contusion of right breast, initial encounter   Rationale for Evaluation and Treatment: Rehabilitation  THERAPY DIAG:  Repeated falls  Other low back pain  Unsteadiness on feet  Muscle weakness (generalized)  Abnormal posture  ONSET DATE: chronic    SUBJECTIVE:                                                                                                                                                                                           SUBJECTIVE STATEMENT: "Feel pretty good." No falls since last session.   PERTINENT HISTORY:  Back surgery x 2 Breast surgery  Lumbar wound debridement  Rt shoulder surgery x 2  Spinal cord stimulator   PAIN:  Are you having pain? Yes: NPRS scale: none currently; at worst 8/10 Pain location: low back/posterior hips Pain description: sore Aggravating factors: household activity Relieving factors: rest  PRECAUTIONS:  Fall  RED FLAGS: None   WEIGHT BEARING RESTRICTIONS:   No  FALLS:  Has patient fallen in last 6 months? Yes. Number of falls 4; see subjective  LIVING ENVIRONMENT: Lives with: lives alone Lives in: House/apartment Stairs: No Has following equipment at home: Single  point cane, Walker - 2 wheeled, shower chair, and Grab bars  OCCUPATION:  Retired   PLOF:  Independent  PATIENT GOALS:  "Be able to do things much better and work around the house. No more falls."  NEXT MD VISIT: March 2025   OBJECTIVE:  Note: Objective measures were completed at Evaluation unless otherwise noted.  DIAGNOSTIC FINDINGS:  No recent imaging on file   PATIENT SURVEYS:  ABC scale 43% self-confidence   05/05/23: 43% self-confidence  COGNITIVE STATUS: Within functional limits for tasks assessed   SENSATION: WFL   POSTURE:  rounded shoulders, forward head, and increased thoracic kyphosis   GAIT: Distance walked: 10 ft  Assistive device utilized: Single point cane Level of assistance: Modified independence Comments: decreased step length; limited push-off, narrow base; no arm swing, forward flexed posture    Body Part #1 Lumbar   LUMBAR ROM:   Active  A/PROM  eval  Flexion 50% limited  Extension 75% limited   Right lateral flexion WFL  Left lateral flexion WFL  Right rotation 50% limited  Left rotation 50% limited   (Blank rows = not tested); pain with all lumbar AROM   LOWER EXTREMITY MMT:    MMT Right eval Left eval 03/15/23 04/26/23 R/L 05/05/23   Hip flexion 3+ 3+  4/4 4+ bilateral   Hip extension       Hip abduction 3+ 3+  4/4- Lt: 4-; Rt: 4  Hip adduction       Hip internal rotation       Hip external rotation       Knee flexion 4 4  4+/4 Lt: 4; Rt: 4+  Knee extension 4 4  4+/4 Lt: 4; Rt: 5   Ankle dorsiflexion 5 5     Ankle plantarflexion Partial range DL calf raise Partial range DL calf raise Minimal range SL calf raise bilaterally  Minimal range SL calf raise bilaterally  Minimal range SL calf raise bilaterally    Ankle inversion       Ankle eversion 5 5      (Blank rows = not tested)    SPECIAL TESTS:  SLR (+)   FUNCTIONAL TESTS:  30 second chair test: 4 reps TUG: 24 seconds  03/15/23:  30 second chair test: 10 reps  TUG: 17.3 seconds     05/05/23 BERG BALANCE TEST Sitting to Standing: 4.      Stands without using hands and stabilize independently Standing Unsupported: 4.      Stands safely for 2 minutes Sitting Unsupported: 4.     Sits for 2 minutes independently Standing to Sitting: 4.     Sits safely with minimal use of hands Transfers: 4.     Transfers safely with minor use of hands Standing with eyes closed: 3.     Stands 10 seconds with supervision Standing with feet together: 4.     Stands for 1 minute safely Reaching forward with outstretched arm: 3.     Reaches forward 5 inches Retrieving object from the floor: 4.      Able to pick up easily and safely Turning to look behind: 2.     Turns sideways only, maintains balance Turning 360 degrees: 4.     Able to turn in </=4 seconds  Place alternate foot on stool: 4.     Completes 8 steps in 20 seconds     Standing with one foot in front: 2.     Independent small step for 30 seconds Standing  on one foot: 3.     Holds 5-10 seconds  Total Score: 49/56  [facility] Adult PT Treatment:                                                DATE: 05/26/23 Therapeutic Exercise: LAQ 2 x 15 @ 5 lbs  HEP review   Therapeutic Activity: Floor transfers working on lowering to the ground, rolling, crawling, and transferring from ground to standing. Handout provided  Step taps 8 inch step 2 x 10 Functional reach to cones in chair forward and lateral 3 x 5 reps  Functional overhead reach into cabinets for 5 tall cones  Functional carry basket with 8 lbs 1 lap in clinic    [facility] Adult PT Treatment:                                                DATE: 05/24/2023 Therapeutic Exercise: Standing: Marching x20 --> repeated with 2#AW Butt kickers x20 -->  repeated with 2#AW Heel raises x20 Alt toe raises x20 Hip abd 2x15 + 2#AW Hip ext 2x15 + 2#AW Seated LAQ + 4#AW 2x12 SLB 3x30" (B): kickstand stance --> fingertip support x2 --> HHAx1  Staggered stance lunge with knee tap down on raised pad + HHAx2 (counter & chair) 2x8 (B) Runner's lunge stretch at counter (B)   [facility] Adult PT Treatment:                                                DATE: 05/19/2023 Therapeutic Exercise: Standing hip abd & ext x10 each (B) Heel raises x20 Marching 3x30" Butt kickers 3x30" Staggered stance weight shifting (active rest break) Resisted side stepping + RTB crossed at ankles (counter for support as needed) Wide stance lateral weight shifting (active rest break)  SLB 3x30" each leg --> fingertip support Leg press: DL 16# X09, 60# A54 Walking with adjusted [facility]    [facility] Adult PT Treatment:                                                DATE: 05/12/2023 Therapeutic Exercise: Fwd walking + same --> opp knee taps 2x15' each Side stepping + 1# dowel chest press  Standing hip abd & ext + 3#AW 3x10 each (B) Marching + 3#AW x1', x 45", x30" Staggered stance squats 2x10 each leg (B) --> added 5#KB unilateral hold with tap on ground NuStep cool down L6 x   [facility] Adult PT Treatment:                                                DATE: 05/10/2023 Therapeutic Exercise: Fwd marching + same knee taps Bkwd walking STS from corner of table x10 --> cradling 4#DB x10 --> cradling 6#DB x10 Seated dead lift + 10#KB x10  Therapeutic Activity: Functional  squatting activities: Standing squats + tap reach with block on floor Staggered stance squat + tap reach with block on floor Fingertip supported single leg balance 2x30" (B) Kickstand SLB + head turns SLB + UE support + head turns Standing on airex: Lateral weight shifting --> small marching Marching Placing cones from counter to top shelf  Squat + touch down on high --> mid blocks     PATIENT EDUCATION:   Education details: HEP review; see treatment  Person educated: Patient Education method: Explanation, Demonstration, Tactile cues, Verbal cues, and Handouts Education comprehension: verbalized understanding, returned demonstration, verbal cues required, tactile cues required, and needs further education  HOME EXERCISE PROGRAM: Access Code: [email or web address] URL: [email or web address] Date: 05/12/2023 Prepared by: Sims Duck  Exercises - Supine Lower Trunk Rotation with PLB  - 1 x daily - 7 x weekly - 3 sets - 10 reps - Sit to Stand with Armchair  - 1 x daily - 7 x weekly - 2 sets - 10 reps - Seated Thoracic Self-Mobilization  - 1 x daily - 7 x weekly - 3 sets - 10 reps - 3 sec hold - Standing Bicep Curls Supinated with Dumbbells  - 1 x daily - 7 x weekly - 3 sets - 10 reps - Side Stepping with Counter Support  - 1 x daily - 7 x weekly - 2 sets - 10 reps - Standing Hip Abduction with Counter Support  - 1 x daily - 7 x weekly - 2 sets - 10 reps - Standing Hip Extension with Counter Support  - 1 x daily - 7 x weekly - 2 sets - 10 reps - Standing Romberg to 1/2 Tandem Stance  - 1 x daily - 7 x weekly - 3 sets - 30 sec  hold - Seated Knee Extension with Resistance  - 1 x daily - 7 x weekly - 2 sets - 10 reps - Single Leg Heel Raise with Counter Support  - 1 x daily - 7 x weekly - 2 sets - 10 reps - Staggered Stance Squat  - 1 x daily - 7 x weekly - 3 sets - 10 reps   ASSESSMENT:  CLINICAL IMPRESSION: Worked on floor transfers today with patient able to complete transfer from lying to standing independently using UE support without signs of instability. Will plan to re-assess at next session to determine overall carry-over of this functional transfer from today's session. Focused on functional activities today with patient requiring SBA to CGA for majority of tasks due to occasional instability most noted with step taps and reaching activity.    OBJECTIVE IMPAIRMENTS: Abnormal  gait, decreased activity tolerance, decreased balance, decreased endurance, decreased knowledge of condition, decreased knowledge of use of DME, decreased mobility, difficulty walking, decreased ROM, decreased strength, decreased safety awareness, impaired flexibility, improper body mechanics, postural dysfunction, and pain.   ACTIVITY LIMITATIONS: carrying, lifting, bending, standing, squatting, stairs, transfers, and locomotion level  PARTICIPATION LIMITATIONS: meal prep, cleaning, laundry, shopping, community activity, and yard work  PERSONAL FACTORS: Age, Fitness, Time since onset of injury/illness/exacerbation, and 3+ comorbidities: see PMH/PSH above are also affecting patient's functional outcome.   REHAB POTENTIAL: Fair see personal factors  CLINICAL DECISION MAKING: Evolving/moderate complexity  EVALUATION COMPLEXITY: Moderate   GOALS: Goals reviewed with patient? Yes  SHORT TERM GOALS: Target date: 03/24/2023  Patient will be independent and compliant with initial HEP.  Baseline: Goal status: MET  2.  Patient will complete at least 7 reps during 30 second chair rise to  improve her functional strength.  Baseline: see above  Goal status: MET  3.  Patient will complete partial range SL calf raise to improve push-off during gait cycle.  Baseline: see above  Goal status: MET  4.  Patient will complete TUG in </=18 seconds to reduce her risk of falls.  Baseline: see above  Goal status: MET   LONG TERM GOALS: Target date: 06/18/23  Patient will score at least 53% self confidence on the ABC scale.  Baseline: 41.9% 05/05/23: 41% Goal status: in progress   2.  Patient will be independent with floor transfers. Baseline: assist from table to stand 05/04/22: requires cues for transfer.  Goal status: IN PROGRESS  3.  Patient will be modified independent with curb negotiation.  Baseline: SPC Goal status: MET  4.  Patient will demonstrate at least 4/5 bilateral hip strength  to improve stability about the chain with walking/standing activity.  Baseline: see above Goal status: partially met   5.  Patient will demonstrate 5/5 bilateral knee strength to improve stability with stair/curb negotiation.  Baseline: see above  Goal status: partially met   6.  Patient will score at least 45/56 on BERG balance to reduce her risk of falls.  Baseline: 49/56  Goal status: MET   PLAN:  PT FREQUENCY: 2x/week  PT DURATION: 6 weeks  PLANNED INTERVENTIONS: 97164- PT Re-evaluation, 97110-Therapeutic exercises, 97530- Therapeutic activity, 97112- Neuromuscular re-education, 97535- Self Care, 16109- Manual therapy, 97116- Gait training, Balance training, Stair training, Taping, Dry Needling, DME instructions, Cryotherapy, and Moist heat.  PLAN FOR NEXT SESSION: n/a d/c   Elveta Rape, PT, DPT, ATC 05/26/23 8:44 AM  Bawi Lakins, PT, DPT, ATC 09/01/23 8:28 AM

## 2023-05-30 NOTE — Telephone Encounter (Signed)
PAP: Patient assistance application for Ozempic has been approved by PAP Companies: [employer] from 05/27/2023 to 04/25/2024. Medication should be delivered to PAP Delivery: Provider's office. For further shipping updates, please [employer] at [phone]. Patient ID is: [id number]

## 2023-05-30 NOTE — Progress Notes (Signed)
Pharmacy Medication Assistance Program Note    05/30/2023  Patient ID: Amanda Morris, female   DOB: 07-15-1950, 73 y.o.   MRN: [id number]     05/23/2023  Outreach Medication One  Initial Outreach Date (Medication One) 05/23/2023  Manufacturer Medication One [employer] [facility] Ozempic  Dose of Ozempic 0.5mg /week  Type of Radiographer, therapeutic Assistance  Date Application Sent to Prescriber 05/23/2023  Name of Prescriber Nani Gasser  Date Application Received From Provider 05/26/2023  Date Application Submitted to Manufacturer 05/26/2023  Method Application Sent to Manufacturer Fax  Patient Assistance Determination Approved  Approval Start Date 05/27/2023  Approval End Date 04/25/2024  Patient Notification Method MyChart     Signature

## 2023-05-31 ENCOUNTER — Ambulatory Visit: Payer: 59 | Admitting: Physical Therapy

## 2023-05-31 NOTE — Therapy (Deleted)
 OUTPATIENT PHYSICAL THERAPY TREATMENT    Patient Name: Amanda Morris MRN: [id number] DOB:1950-07-31, 73 y.o., female Today's Date: 05/31/2023  END OF SESSION:    Past Medical History:  Diagnosis Date   Allergy    Diabetes mellitus without complication (HCC)    type 2   GERD (gastroesophageal reflux disease)    Headache    History of hiatal hernia    Hypercholesterolemia    Numbness    left, outer thigh   Palpitations    in the past, no current issues per patient   Postlaminectomy syndrome    Past Surgical History:  Procedure Laterality Date   ABDOMINAL HYSTERECTOMY  1982   BACK SURGERY     x 2    BREAST REDUCTION SURGERY Bilateral 03/17/2016   Procedure: MAMMARY REDUCTION  (BREAST);  Surgeon: Estefana GORMAN Fritter, DO;  Location: [facility];  Service: Plastics;  Laterality: Bilateral;   BREAST SURGERY     COLONOSCOPY     LUMBAR WOUND DEBRIDEMENT N/A 05/22/2020   Procedure: Incision and drainage of lumbar wound;  Surgeon: Gillie Duncans, MD;  Location: [facility] OR;  Service: Neurosurgery;  Laterality: N/A;   SHOULDER SURGERY  06/2008, 09/2010   Right, by Dr. Lorence, then Dr. Wilbern   SPINAL CORD STIMULATOR INSERTION N/A 12/02/2017   Procedure: LUMBAR SPINAL CORD STIMULATOR INSERTION;  Surgeon: Gillie Duncans, MD;  Location: [facility] OR;  Service: Neurosurgery;  Laterality: N/A;   SPINAL CORD STIMULATOR TRIAL N/A 11/25/2017   Procedure: INSERTION LUMBAR SPINAL CORD STIMULATOR TRIAL  ;  Surgeon: Gillie Duncans, MD;  Location: [facility] OR;  Service: Neurosurgery;  Laterality: N/A;   INSERTION LUMBAR SPINAL CORD STIMULATOR TRIAL     UPPER GI ENDOSCOPY     Patient Active Problem List   Diagnosis Date Noted   IBS (irritable bowel syndrome) 04/28/2023   Hiatal hernia 04/28/2023   Osteopenia after menopause 02/18/2023   Frequent falls 02/10/2023   Screening for osteoporosis 02/08/2023   Lung nodule 12/24/2022   Atherosclerosis of aorta (HCC) 11/02/2022   Right flank  pain 08/17/2022   Left hip pain 02/23/2021   Low back pain 02/23/2021   Gait abnormality 02/23/2021   Bilateral lower extremity edema 01/22/2021   Elevated CK 10/12/2020   Spinal stenosis 10/09/2020   Lumbar adjacent segment disease with spondylolisthesis 10/09/2020   Spinal stenosis, lumbar region with neurogenic claudication 04/16/2020   Peripheral neuropathy 11/28/2019   Paresthesia and pain of both upper extremities 11/12/2019   Chronic midline low back pain with bilateral sciatica 10/30/2019   Left thigh pain 10/30/2019   Meralgia paresthetica, left 09/12/2019   Everitt Quervain's disease (tenosynovitis) 02/06/2019   Acute bilateral low back pain without sciatica 12/01/2018   Failed back syndrome of lumbar spine 11/25/2017   Inflammation of sacroiliac joint (HCC) 06/07/2017   Displacement of lumbar intervertebral disc 11/22/2016   Fatty liver 09/27/2016   Status post bilateral breast reduction 03/23/2016   Hypertrophy of breast 08/12/2015   Spondylolisthesis of lumbar region 06/13/2015   History of lumbar fusion 04/07/2015   Radiculopathy, lumbar region 04/07/2015   Essential hypertension 02/14/2015   Pelvic pain 12/02/2014   Postprandial vomiting 02/04/2014   Obesity (BMI 30-39.9) 11/01/2013   Carotid artery stenosis 06/07/2013   Hereditary and idiopathic peripheral neuropathy 06/07/2013   History of migraine 05/04/2013   Type 2 diabetes mellitus with other specified complication (HCC) 05/05/2011   SVT (supraventricular tachycardia) (HCC) 11/03/2010   GERD (gastroesophageal reflux disease) 10/29/2010   Chronic low  back pain 10/29/2010   Hyperlipidemia associated with type 2 diabetes mellitus (HCC) 10/29/2010    PCP: Alvan Dorothyann BIRCH, MD  REFERRING PROVIDER:  Booker Darice SAUNDERS, FNP (encounter for medicare annual wellness exam)   Alvan Dorothyann BIRCH, MD (other diagnoses)   REFERRING DIAG:  Z00.00 (ICD-10-CM) - Encounter for Medicare annual wellness exam   R07.9  (ICD-10-CM) - Right-sided chest pain  M89.8X1 (ICD-10-CM) - Clavicle pain  M54.50 (ICD-10-CM) - Acute bilateral low back pain without sciatica  S20.01XA (ICD-10-CM) - Contusion of right breast, initial encounter   Rationale for Evaluation and Treatment: Rehabilitation  THERAPY DIAG:  No diagnosis found.  ONSET DATE: chronic    SUBJECTIVE:                                                                                                                                                                                           SUBJECTIVE STATEMENT: *** Feel pretty good. No falls since last session.   PERTINENT HISTORY:  Back surgery x 2 Breast surgery  Lumbar wound debridement  Rt shoulder surgery x 2  Spinal cord stimulator   PAIN:  Are you having pain? Yes: NPRS scale: none currently; at worst 8/10 Pain location: low back/posterior hips Pain description: sore Aggravating factors: household activity Relieving factors: rest  PRECAUTIONS:  Fall  RED FLAGS: None   WEIGHT BEARING RESTRICTIONS:  No  FALLS:  Has patient fallen in last 6 months? Yes. Number of falls 4; see subjective  LIVING ENVIRONMENT: Lives with: lives alone Lives in: House/apartment Stairs: No Has following equipment at home: Single point cane, Environmental Consultant - 2 wheeled, shower chair, and Grab bars  OCCUPATION:  Retired   PLOF:  Independent  PATIENT GOALS:  Be able to do things much better and work around the house. No more falls.  NEXT MD VISIT: March 2025   OBJECTIVE:  Note: Objective measures were completed at Evaluation unless otherwise noted.  DIAGNOSTIC FINDINGS:  No recent imaging on file   PATIENT SURVEYS:  ABC scale 43% self-confidence   05/05/23: 43% self-confidence   POSTURE:  rounded shoulders, forward head, and increased thoracic kyphosis  GAIT: Distance walked: 10 ft  Assistive device utilized: Single point cane Level of assistance: Modified independence Comments:  decreased step length; limited push-off, narrow base; no arm swing, forward flexed posture    Body Part #1 Lumbar   LUMBAR ROM:   Active  A/PROM  eval  Flexion 50% limited  Extension 75% limited   Right lateral flexion WFL  Left lateral flexion WFL  Right rotation 50% limited  Left rotation 50% limited   (Blank rows = not  tested); pain with all lumbar AROM   LOWER EXTREMITY MMT:    MMT Right eval Left eval 03/15/23 04/26/23 R/L 05/05/23   Hip flexion 3+ 3+  4/4 4+ bilateral   Hip extension       Hip abduction 3+ 3+  4/4- Lt: 4-; Rt: 4  Hip adduction       Hip internal rotation       Hip external rotation       Knee flexion 4 4  4+/4 Lt: 4; Rt: 4+  Knee extension 4 4  4+/4 Lt: 4; Rt: 5   Ankle dorsiflexion 5 5     Ankle plantarflexion Partial range DL calf raise Partial range DL calf raise Minimal range SL calf raise bilaterally  Minimal range SL calf raise bilaterally  Minimal range SL calf raise bilaterally   Ankle inversion       Ankle eversion 5 5      (Blank rows = not tested)    SPECIAL TESTS:  SLR (+)   FUNCTIONAL TESTS:  30 second chair test: 4 reps TUG: 24 seconds  03/15/23:  30 second chair test: 10 reps  TUG: 17.3 seconds     05/05/23 BERG BALANCE TEST Sitting to Standing: 4.      Stands without using hands and stabilize independently Standing Unsupported: 4.      Stands safely for 2 minutes Sitting Unsupported: 4.     Sits for 2 minutes independently Standing to Sitting: 4.     Sits safely with minimal use of hands Transfers: 4.     Transfers safely with minor use of hands Standing with eyes closed: 3.     Stands 10 seconds with supervision Standing with feet together: 4.     Stands for 1 minute safely Reaching forward with outstretched arm: 3.     Reaches forward 5 inches Retrieving object from the floor: 4.      Able to pick up easily and safely Turning to look behind: 2.     Turns sideways only, maintains balance Turning 360 degrees: 4.      Able to turn in </=4 seconds  Place alternate foot on stool: 4.     Completes 8 steps in 20 seconds     Standing with one foot in front: 2.     Independent small step for 30 seconds Standing on one foot: 3.     Holds 5-10 seconds  Total Score: 49/56  [facility] Adult PT Treatment:                                                DATE: 05/31/23 Therapeutic Exercise: *** Manual Therapy: *** Neuromuscular re-ed: *** Therapeutic Activity: *** Gait: *** Modalities: *** Self Care: ***   [facility] Adult PT Treatment:                                                DATE: 05/26/23 Therapeutic Exercise: LAQ 2 x 15 @ 5 lbs  HEP review   Therapeutic Activity: Floor transfers working on lowering to the ground, rolling, crawling, and transferring from ground to standing. Handout provided  Step taps 8 inch step 2 x 10 Functional reach to cones in chair forward  and lateral 3 x 5 reps  Functional overhead reach into cabinets for 5 tall cones  Functional carry basket with 8 lbs 1 lap in clinic    [facility] Adult PT Treatment:                                                DATE: 05/24/2023 Therapeutic Exercise: Standing: Marching x20 --> repeated with 2#AW Butt kickers x20 --> repeated with 2#AW Heel raises x20 Alt toe raises x20 Hip abd 2x15 + 2#AW Hip ext 2x15 + 2#AW Seated LAQ + 4#AW 2x12 SLB 3x30 (B): kickstand stance --> fingertip support x2 --> HHAx1  Staggered stance lunge with knee tap down on raised pad + HHAx2 (counter & chair) 2x8 (B) Runner's lunge stretch at counter (B)   [facility] Adult PT Treatment:                                                DATE: 05/19/2023 Therapeutic Exercise: Standing hip abd & ext x10 each (B) Heel raises x20 Marching 3x30 Butt kickers 3x30 Staggered stance weight shifting (active rest break) Resisted side stepping + RTB crossed at ankles (counter for support as needed) Wide stance lateral weight shifting (active rest break)  SLB 3x30 each leg --> fingertip  support Leg press: DL 09# k89, 29# k89 Walking with adjusted [facility]    [facility] Adult PT Treatment:                                                DATE: 05/12/2023 Therapeutic Exercise: Fwd walking + same --> opp knee taps 2x15' each Side stepping + 1# dowel chest press  Standing hip abd & ext + 3#AW 3x10 each (B) Marching + 3#AW x1', x 45, x30 Staggered stance squats 2x10 each leg (B) --> added 5#KB unilateral hold with tap on ground NuStep cool down L6 x   [facility] Adult PT Treatment:                                                DATE: 05/10/2023 Therapeutic Exercise: Fwd marching + same knee taps Bkwd walking STS from corner of table x10 --> cradling 4#DB x10 --> cradling 6#DB x10 Seated dead lift + 10#KB x10  Therapeutic Activity: Functional squatting activities: Standing squats + tap reach with block on floor Staggered stance squat + tap reach with block on floor Fingertip supported single leg balance 2x30 (B) Kickstand SLB + head turns SLB + UE support + head turns Standing on airex: Lateral weight shifting --> small marching Marching Placing cones from counter to top shelf  Squat + touch down on high --> mid blocks     PATIENT EDUCATION:  Education details: HEP review; see treatment  Person educated: Patient Education method: Explanation, Demonstration, Tactile cues, Verbal cues, and Handouts Education comprehension: verbalized understanding, returned demonstration, verbal cues required, tactile cues required, and needs further education  HOME EXERCISE PROGRAM: Access Code: [email or web address] URL: [email or web address]  Date: 05/12/2023 Prepared by: Lamarr Price  Exercises - Supine Lower Trunk Rotation with PLB  - 1 x daily - 7 x weekly - 3 sets - 10 reps - Sit to Stand with Armchair  - 1 x daily - 7 x weekly - 2 sets - 10 reps - Seated Thoracic Self-Mobilization  - 1 x daily - 7 x weekly - 3 sets - 10 reps - 3 sec hold - Standing Bicep Curls Supinated with  Dumbbells  - 1 x daily - 7 x weekly - 3 sets - 10 reps - Side Stepping with Counter Support  - 1 x daily - 7 x weekly - 2 sets - 10 reps - Standing Hip Abduction with Counter Support  - 1 x daily - 7 x weekly - 2 sets - 10 reps - Standing Hip Extension with Counter Support  - 1 x daily - 7 x weekly - 2 sets - 10 reps - Standing Romberg to 1/2 Tandem Stance  - 1 x daily - 7 x weekly - 3 sets - 30 sec  hold - Seated Knee Extension with Resistance  - 1 x daily - 7 x weekly - 2 sets - 10 reps - Single Leg Heel Raise with Counter Support  - 1 x daily - 7 x weekly - 2 sets - 10 reps - Staggered Stance Squat  - 1 x daily - 7 x weekly - 3 sets - 10 reps   ASSESSMENT:  CLINICAL IMPRESSION: *** Worked on floor transfers today with patient able to complete transfer from lying to standing independently using UE support without signs of instability. Will plan to re-assess at next session to determine overall carry-over of this functional transfer from today's session. Focused on functional activities today with patient requiring SBA to CGA for majority of tasks due to occasional instability most noted with step taps and reaching activity.    OBJECTIVE IMPAIRMENTS: Abnormal gait, decreased activity tolerance, decreased balance, decreased endurance, decreased knowledge of condition, decreased knowledge of use of DME, decreased mobility, difficulty walking, decreased ROM, decreased strength, decreased safety awareness, impaired flexibility, improper body mechanics, postural dysfunction, and pain.     GOALS: Goals reviewed with patient? Yes  SHORT TERM GOALS: Target date: 03/24/2023  Patient will be independent and compliant with initial HEP.  Baseline: Goal status: MET  2.  Patient will complete at least 7 reps during 30 second chair rise to improve her functional strength.  Baseline: see above  Goal status: MET  3.  Patient will complete partial range SL calf raise to improve push-off during gait  cycle.  Baseline: see above  Goal status: MET  4.  Patient will complete TUG in </=18 seconds to reduce her risk of falls.  Baseline: see above  Goal status: MET   LONG TERM GOALS: Target date: 06/18/23  Patient will score at least 53% self confidence on the ABC scale.  Baseline: 41.9% 05/05/23: 41% Goal status: in progress   2.  Patient will be independent with floor transfers. Baseline: assist from table to stand 05/04/22: requires cues for transfer.  Goal status: IN PROGRESS  3.  Patient will be modified independent with curb negotiation.  Baseline: SPC Goal status: MET  4.  Patient will demonstrate at least 4/5 bilateral hip strength to improve stability about the chain with walking/standing activity.  Baseline: see above Goal status: partially met   5.  Patient will demonstrate 5/5 bilateral knee strength to improve stability with stair/curb negotiation.  Baseline:  see above  Goal status: partially met   6.  Patient will score at least 45/56 on BERG balance to reduce her risk of falls.  Baseline: 49/56  Goal status: MET   PLAN:  PT FREQUENCY: 2x/week  PT DURATION: 6 weeks  PLANNED INTERVENTIONS: 97164- PT Re-evaluation, 97110-Therapeutic exercises, 97530- Therapeutic activity, 97112- Neuromuscular re-education, 97535- Self Care, 02859- Manual therapy, 97116- Gait training, Balance training, Stair training, Taping, Dry Needling, DME instructions, Cryotherapy, and Moist heat.  PLAN FOR NEXT SESSION: Generalized lower extremity strengthening (STANDING), Knee strengthening; balance on unstable surface, dynamic gait tasks. Check floor transfer.   Melchizedek Espinola April Ma L Katieann Hungate, PT, DPT 05/31/23 7:54 AM

## 2023-06-02 ENCOUNTER — Other Ambulatory Visit: Payer: Self-pay | Admitting: Family Medicine

## 2023-06-02 MED ORDER — FAMOTIDINE 20 MG PO TABS: 20.0000 mg | ORAL_TABLET | Freq: Every day | ORAL | 1 refills | Status: DC

## 2023-06-02 NOTE — Telephone Encounter (Signed)
 Copied from CRM [id number]. Topic: Clinical - Medication Refill >> Jun 02, 2023  1:15 PM Carlatta H wrote: Most Recent Primary Care Visit:  Provider: METHENEY, CATHERINE D  Department: [facility]  Visit Type: SAME DAY  Date: 05/05/2023  Medication: famotidine  (PEPCID ) 20 MG tablet [[id number]]  Has the patient contacted their pharmacy? No (Agent: If no, request that the patient contact the pharmacy for the refill. If patient does not wish to contact the pharmacy document the reason why and proceed with request.) (Agent: If yes, when and what did the pharmacy advise?)  Is this the correct pharmacy for this prescription? Yes If no, delete pharmacy and type the correct one.  This is the patient's preferred pharmacy:  [id number] - [place], [place] - [address] [place] [address] Phone: [phone] Fax: [phone]   Has the prescription been filled recently? No  Is the patient out of the medication? Yes  Has the patient been seen for an appointment in the last year OR does the patient have an upcoming appointment? Yes  Can we respond through MyChart? No  Agent: Please be advised that Rx refills may take up to 3 business days. We ask that you follow-up with your pharmacy.

## 2023-06-03 ENCOUNTER — Telehealth: Payer: Self-pay

## 2023-06-03 ENCOUNTER — Ambulatory Visit: Payer: Self-pay | Admitting: Family Medicine

## 2023-06-03 DIAGNOSIS — I7090 Unspecified atherosclerosis: Secondary | ICD-10-CM | POA: Diagnosis not present

## 2023-06-03 DIAGNOSIS — E119 Type 2 diabetes mellitus without complications: Secondary | ICD-10-CM | POA: Diagnosis not present

## 2023-06-03 DIAGNOSIS — R112 Nausea with vomiting, unspecified: Secondary | ICD-10-CM | POA: Diagnosis not present

## 2023-06-03 DIAGNOSIS — Z1152 Encounter for screening for COVID-19: Secondary | ICD-10-CM | POA: Diagnosis not present

## 2023-06-03 DIAGNOSIS — R0602 Shortness of breath: Secondary | ICD-10-CM | POA: Diagnosis not present

## 2023-06-03 DIAGNOSIS — Z79899 Other long term (current) drug therapy: Secondary | ICD-10-CM | POA: Diagnosis not present

## 2023-06-03 DIAGNOSIS — E1165 Type 2 diabetes mellitus with hyperglycemia: Secondary | ICD-10-CM | POA: Diagnosis not present

## 2023-06-03 DIAGNOSIS — I10 Essential (primary) hypertension: Secondary | ICD-10-CM | POA: Diagnosis not present

## 2023-06-03 DIAGNOSIS — Z880 Allergy status to penicillin: Secondary | ICD-10-CM | POA: Diagnosis not present

## 2023-06-03 DIAGNOSIS — Z888 Allergy status to other drugs, medicaments and biological substances status: Secondary | ICD-10-CM | POA: Diagnosis not present

## 2023-06-03 DIAGNOSIS — Z886 Allergy status to analgesic agent status: Secondary | ICD-10-CM | POA: Diagnosis not present

## 2023-06-03 DIAGNOSIS — Z885 Allergy status to narcotic agent status: Secondary | ICD-10-CM | POA: Diagnosis not present

## 2023-06-03 DIAGNOSIS — E876 Hypokalemia: Secondary | ICD-10-CM | POA: Insufficient documentation

## 2023-06-03 DIAGNOSIS — R918 Other nonspecific abnormal finding of lung field: Secondary | ICD-10-CM | POA: Diagnosis not present

## 2023-06-03 DIAGNOSIS — N281 Cyst of kidney, acquired: Secondary | ICD-10-CM | POA: Diagnosis not present

## 2023-06-03 DIAGNOSIS — K429 Umbilical hernia without obstruction or gangrene: Secondary | ICD-10-CM | POA: Diagnosis not present

## 2023-06-03 DIAGNOSIS — R509 Fever, unspecified: Secondary | ICD-10-CM | POA: Diagnosis not present

## 2023-06-03 DIAGNOSIS — R109 Unspecified abdominal pain: Secondary | ICD-10-CM | POA: Diagnosis not present

## 2023-06-03 DIAGNOSIS — Z7985 Long-term (current) use of injectable non-insulin antidiabetic drugs: Secondary | ICD-10-CM | POA: Diagnosis not present

## 2023-06-03 DIAGNOSIS — K3189 Other diseases of stomach and duodenum: Secondary | ICD-10-CM | POA: Diagnosis not present

## 2023-06-03 DIAGNOSIS — K3184 Gastroparesis: Secondary | ICD-10-CM | POA: Diagnosis not present

## 2023-06-03 DIAGNOSIS — K449 Diaphragmatic hernia without obstruction or gangrene: Secondary | ICD-10-CM | POA: Diagnosis not present

## 2023-06-03 DIAGNOSIS — I2089 Other forms of angina pectoris: Secondary | ICD-10-CM | POA: Diagnosis not present

## 2023-06-03 DIAGNOSIS — K319 Disease of stomach and duodenum, unspecified: Secondary | ICD-10-CM | POA: Diagnosis not present

## 2023-06-03 DIAGNOSIS — Z87891 Personal history of nicotine dependence: Secondary | ICD-10-CM | POA: Diagnosis not present

## 2023-06-03 DIAGNOSIS — N390 Urinary tract infection, site not specified: Secondary | ICD-10-CM | POA: Diagnosis not present

## 2023-06-03 DIAGNOSIS — E1143 Type 2 diabetes mellitus with diabetic autonomic (poly)neuropathy: Secondary | ICD-10-CM | POA: Diagnosis not present

## 2023-06-03 DIAGNOSIS — Z7952 Long term (current) use of systemic steroids: Secondary | ICD-10-CM | POA: Diagnosis not present

## 2023-06-03 DIAGNOSIS — Z7982 Long term (current) use of aspirin: Secondary | ICD-10-CM | POA: Diagnosis not present

## 2023-06-03 DIAGNOSIS — E785 Hyperlipidemia, unspecified: Secondary | ICD-10-CM | POA: Diagnosis not present

## 2023-06-03 DIAGNOSIS — T383X5A Adverse effect of insulin and oral hypoglycemic [antidiabetic] drugs, initial encounter: Secondary | ICD-10-CM | POA: Diagnosis not present

## 2023-06-03 DIAGNOSIS — T84216A Breakdown (mechanical) of internal fixation device of vertebrae, initial encounter: Secondary | ICD-10-CM | POA: Diagnosis not present

## 2023-06-03 DIAGNOSIS — K219 Gastro-esophageal reflux disease without esophagitis: Secondary | ICD-10-CM | POA: Diagnosis not present

## 2023-06-03 NOTE — Telephone Encounter (Signed)
 Chief Complaint: Abdominal pain  Symptoms: nausea, vomiting, diarrhea, back pain  Frequency: comes and goes  Pertinent Negatives: Patient denies fever, chest pain, constipation  Disposition: [] ED /[] Urgent Care (no appt availability in office) / [x] Appointment(In office/virtual)/ []  [place] [facility]/ [] Home Care/ [] Refused Recommended Disposition /[] [place] [facility]/ []  Follow-up with PCP Additional Notes: Patient states she has had a stomach ache since Monday but began to have diarrhea and vomiting on Wednesday. Patient states she has not been around anyone else with these symptoms. Care advice was given and patient asked for an appointment on Monday. Appointment has been scheduled per request. Advised patient if symptoms get worse over the weekend to seek care at the local urgent care. Patient verbalized understanding.   Copied from CRM [id number]. Topic: Clinical - Red Word Triage >> Jun 03, 2023  8:55 AM Elle L wrote: Red Word that prompted transfer to Nurse Triage: The patient has uncontrolled vomiting, diarrhea, nausea, stomach pain, a headache and she is unable to eat. The symptoms started on Monday but has been worsening since Wednesday. Reason for Disposition  [1] MILD-MODERATE pain AND [2] constant AND [3] present > 2 hours  Answer Assessment - Initial Assessment Questions 1. LOCATION: Where does it hurt?      All over  2. RADIATION: Does the pain shoot anywhere else? (e.g., chest, back)     Yes, my back  3. ONSET: When did the pain begin? (e.g., minutes, hours or days ago)      Monday  4. SUDDEN: Gradual or sudden onset?     Gradual  5. PATTERN Does the pain come and go, or is it constant?    - If it comes and goes: How long does it last? Do you have pain now?     (Note: Comes and goes means the pain is intermittent. It goes away completely between bouts.)    - If constant: Is it getting better, staying the same, or getting worse?      (Note: Constant  means the pain never goes away completely; most serious pain is constant and gets worse.)      Comes and goes  6. SEVERITY: How bad is the pain?  (e.g., Scale 1-10; mild, moderate, or severe)    - MILD (1-3): Doesn't interfere with normal activities, abdomen soft and not tender to touch.     - MODERATE (4-7): Interferes with normal activities or awakens from sleep, abdomen tender to touch.     - SEVERE (8-10): Excruciating pain, doubled over, unable to do any normal activities.       Above 10/10 7. RECURRENT SYMPTOM: Have you ever had this type of stomach pain before? If Yes, ask: When was the last time? and What happened that time?      No  8. CAUSE: What do you think is causing the stomach pain?     I'm not sure  9. RELIEVING/AGGRAVATING FACTORS: What makes it better or worse? (e.g., antacids, bending or twisting motion, bowel movement)     If I drink anything it makes it feel worse  10. OTHER SYMPTOMS: Do you have any other symptoms? (e.g., back pain, diarrhea, fever, urination pain, vomiting)       Vomiting, diarrhea, headache, decreased appetite  Protocols used: Abdominal Pain - Female-A-AH

## 2023-06-03 NOTE — Telephone Encounter (Signed)
 Copied from CRM [id number]. Topic: Clinical - Red Word Triage >> Jun 03, 2023 10:36 AM Farrel B wrote: [facility] that prompted transfer to Nurse Triage: patient has spoken to the triage nurse with red word symptoms, unable to schedule. Patient is having severe diarrhea, vomiting, abdominal pains.   Chief Complaint: vomiting Symptoms: abdominal pain Frequency: ongoing since yesterday Disposition: [x] ED /[] Urgent Care (no appt availability in office) / [] Appointment(In office/virtual)/ []  [place] [facility]/ [] Home Care/ [] Refused Recommended Disposition /[] [place] [facility]/ []  Follow-up with PCP Additional Notes: The patient reported Vomiting and diarrhea Monday that stopped Tuesday and Wednesday and restarted Thursday.  Today, she has vomited 3-4 times, maybe more.  She is unable to hold liquids down.  She has not eaten anything.  She reported 10/10 abdominal pain a headache and severe nausea.  She said she has thrown up green and yellow phlegm.  She was advised to go to the ED for further assessment.  She was agreeable.Reason for Disposition  [1] MODERATE vomiting (e.g., 3 - 5 times/day) AND [2] age > 60 years  Answer Assessment - Initial Assessment Questions 1. VOMITING SEVERITY: How many times have you vomited in the past 24 hours?     - MILD:  1 - 2 times/day    - MODERATE: 3 - 5 times/day, decreased oral intake without significant weight loss or symptoms of dehydration    - SEVERE: 6 or more times/day, vomits everything or nearly everything, with significant weight loss, symptoms of dehydration      3-4 times today  2. ONSET: When did the vomiting begin?      Monday then stopped Tuesday and Wednesday, restarted Thursday  3. FLUIDS: What fluids or food have you vomited up today? Have you been able to keep any fluids down?     Unable to keep liquid down  4. ABDOMEN PAIN: Are your having any abdomen pain? If Yes : How bad is it and what does it feel like? (e.g.,  crampy, dull, intermittent, constant)      Nauseated and stomach pain 10/10 5. DIARRHEA: Is there any diarrhea? If Yes, ask: How many times today?      None today but yesterday  6. CONTACTS: Is there anyone else in the family with the same symptoms?      None  7. CAUSE: What do you think is causing your vomiting?     Unknown  8. HYDRATION STATUS: Any signs of dehydration? (e.g., dry mouth [not only dry lips], too weak to stand) When did you last urinate?     Denied weakness, urinating normally  9. OTHER SYMPTOMS: Do you have any other symptoms? (e.g., fever, headache, vertigo, vomiting blood or coffee grounds, recent head injury)     Vomiting Green and yellow  Protocols used: Vomiting-A-AH

## 2023-06-03 NOTE — Telephone Encounter (Signed)
 Forwarding to [place] as an FINANCIAL PLANNER.  Tonya  [employer] PAP shipment for Ozempic  0.25/0.5 mg dose / 4 boxes received this morning. Please contact the patient to come and pick up their order today. Placed in the PAP fridge with patient identifier. Thanks in advance.   NDC: 9830-5818-86 LOT: [id number] EXP: 2026-01-23

## 2023-06-05 NOTE — Progress Notes (Deleted)
     Established patient visit   Patient: Amanda Morris   DOB: 02-10-1951   73 y.o. Female  MRN: [id number] Visit Date: 06/06/2023  Today's healthcare provider: Charlton Amor, DO   No chief complaint on file.   SUBJECTIVE   No chief complaint on file.  HPI  Pt presents with abdominal pain.   Review of Systems     No outpatient medications have been marked as taking for the 06/06/23 encounter (Appointment) with Charlton Amor, DO.    OBJECTIVE    LMP  (LMP Unknown)   Physical Exam     ASSESSMENT & PLAN    Problem List Items Addressed This Visit   None   No follow-ups on file.      No orders of the defined types were placed in this encounter.   No orders of the defined types were placed in this encounter.    Charlton Amor, DO  [facility] at [facility] [phone] (phone) [phone] (fax)  [facility]

## 2023-06-06 ENCOUNTER — Ambulatory Visit: Payer: 59 | Admitting: Family Medicine

## 2023-06-07 ENCOUNTER — Ambulatory Visit: Payer: 59 | Attending: Family Medicine

## 2023-06-07 ENCOUNTER — Telehealth: Payer: Self-pay

## 2023-06-07 DIAGNOSIS — R296 Repeated falls: Secondary | ICD-10-CM | POA: Insufficient documentation

## 2023-06-07 DIAGNOSIS — R293 Abnormal posture: Secondary | ICD-10-CM | POA: Insufficient documentation

## 2023-06-07 DIAGNOSIS — M6281 Muscle weakness (generalized): Secondary | ICD-10-CM | POA: Insufficient documentation

## 2023-06-07 DIAGNOSIS — R2681 Unsteadiness on feet: Secondary | ICD-10-CM | POA: Insufficient documentation

## 2023-06-07 DIAGNOSIS — M5459 Other low back pain: Secondary | ICD-10-CM | POA: Insufficient documentation

## 2023-06-08 ENCOUNTER — Telehealth: Payer: Self-pay

## 2023-06-08 NOTE — Telephone Encounter (Signed)
Called and informed pt about PAP.    She then told me that she had been admitted to the hospital on 2/7. She is still there and is unsure when she will be discharged. I advised her that she will need to schedule a f/u appt once she is released. She voiced understanding and agreed.

## 2023-06-08 NOTE — Telephone Encounter (Signed)
Copied from CRM [id number]. Topic: Clinical - Medical Advice >> Jun 08, 2023  8:24 AM Nila Nephew wrote: Reason for CRM: Patient calling to state that she is in the hospital for upper GI issues. Patient refused to state what she needs to speak to provider or nurse about. Patient requesting call back from specifically Tonya. Patient declined to speak to triage nurse.

## 2023-06-09 ENCOUNTER — Telehealth: Payer: Self-pay

## 2023-06-09 ENCOUNTER — Ambulatory Visit: Payer: 59

## 2023-06-09 NOTE — Telephone Encounter (Signed)
Attempted to call patient about missed appointment today, however unable to leave a voicemail. Noted in my Chart that patient has been recently hospitalized due to GI issues.  Carlynn Herald, PTA 06/09/2023 8:14 AM

## 2023-06-13 ENCOUNTER — Telehealth: Payer: Self-pay

## 2023-06-13 NOTE — Telephone Encounter (Signed)
 Spoke w/pt and advised her that she will need to schedule a f/u appt with Dr. Linford Arnold once she has been discharged. She voiced understanding and agreed,

## 2023-06-13 NOTE — Telephone Encounter (Signed)
 Unable to LVM regarding cancellation of PT visits due to recent hospitalization.   Letitia Libra, PT, DPT, ATC 06/13/23 1:10 PM

## 2023-06-13 NOTE — Transitions of Care (Post Inpatient/ED Visit) (Signed)
   06/13/2023  Name: Bernadene Garside MRN: [id number] DOB: [birth year]  Today's TOC FU Call Status: Today's TOC FU Call Status:: Unsuccessful Call (1st Attempt) Unsuccessful Call (1st Attempt) Date: 06/13/23  Attempted to reach the patient regarding the most recent Inpatient/ED visit. Patient was called in an Outreach attempt to offer VBCI  30-day TOC program.Pt is eligible for program due to potential risk for readmission and/or high utilization. Unfortunately, I was not able to speak with the patient in regards to recent hospital discharge   Follow Up Plan: Additional outreach attempts will be made to reach the patient to complete the Transitions of Care (Post Inpatient/ED visit) call.   Jodelle Gross RN, BSN, CCM [place]  [facility] Manager Population Health Direct Dial: [phone]  Fax: [phone]

## 2023-06-14 ENCOUNTER — Ambulatory Visit: Payer: 59

## 2023-06-14 ENCOUNTER — Telehealth: Payer: Self-pay | Admitting: *Deleted

## 2023-06-14 NOTE — Transitions of Care (Post Inpatient/ED Visit) (Signed)
 06/14/2023  Name: Brayah Urquilla MRN: [id number] DOB: 07-Apr-1951  Today's TOC FU Call Status: Today's TOC FU Call Status:: Successful TOC FU Call Completed TOC FU Call Complete Date: 06/14/23 Patient's Name and Date of Birth confirmed.  Transition Care Management Follow-up Telephone Call Date of Discharge: 06/11/23 Discharge Facility: Other [facility]) Name of Other ([facility]) Discharge Facility: [facility] Type of Discharge: Inpatient Admission Primary Inpatient Discharge Diagnosis:: Nausea and vomiting How have you been since you were released from the hospital?: Better Any questions or concerns?: No  Items Reviewed: Did you receive and understand the discharge instructions provided?: Yes Medications obtained,verified, and reconciled?: Yes (Medications Reviewed) Any new allergies since your discharge?: No Dietary orders reviewed?: No Do you have support at home?: Yes People in Home: alone Name of Support/Comfort Primary Source: Delice Bison  Medications Reviewed Today: Medications Reviewed Today     Reviewed by Luella Cook, RN (Case Manager) on 06/14/23 at 1149  Med List Status: <None>   Medication Order Taking? Sig Documenting Provider Last Dose Status Informant  Accu-Chek Softclix Lancets lancets [id number] Yes CHECK BLOOD SUGAR 3 TIMES DAILY. DX: E11.9 Agapito Games, MD Taking Active   aspirin EC 81 MG tablet [id number] Yes Take 1 tablet (81 mg total) by mouth daily. Alyson Ingles, PA-C Taking Active Multiple Informants           Med Note Carmin Muskrat Dec 25, 2020 10:27 AM)    atorvastatin (LIPITOR) 40 MG tablet [id number] Yes TAKE 1 TABLET BY MOUTH EVERYDAY AT BEDTIME Agapito Games, MD Taking Active   B-D ULTRAFINE III SHORT PEN 31G X 8 MM MISC [id number] Yes USE AS DIRECTED Agapito Games, MD Taking Active   Blood Glucose Monitoring Suppl (ACCU-CHEK AVIVA PLUS) w/Device KIT [id number] Yes Check blood sugars 3 times  daily DX:E11.9 Agapito Games, MD Taking Active Multiple Informants  calcium carbonate (TUMS - DOSED IN MG ELEMENTAL CALCIUM) 500 MG chewable tablet [id number] Yes Chew 1 tablet by mouth daily. [provider]  Active            Med Note Ian Malkin, Dennard Schaumann   Tue Jun 14, 2023 11:46 AM) Daily as needed for heartburn   cholecalciferol (VITAMIN D) 1000 units tablet [id number] Yes Take 1,000 Units by mouth daily. [provider] Taking Active Multiple Informants  dexlansoprazole (DEXILANT) 60 MG capsule [id number] Yes Take 60 mg by mouth daily. [provider] Taking Active Multiple Informants  dicyclomine (BENTYL) 20 MG tablet [id number] Yes Take 20 mg by mouth every 6 (six) hours as needed for spasms (cramps/stomach pain). [provider] Taking Active Multiple Informants  doxepin (SINEQUAN) 10 MG capsule [id number] Yes Take 10 mg by mouth at bedtime. [provider] Taking Active Multiple Informants  doxycycline (VIBRA-TABS) 100 MG tablet [id number] Yes Take 1 tablet (100 mg total) by mouth 2 (two) times daily. Agapito Games, MD Taking Active   famotidine (PEPCID) 20 MG tablet [id number] Yes Take 1 tablet (20 mg total) by mouth daily. Agapito Games, MD Taking Active   Ferrous Sulfate ([employer] SLOW RELEASE IRON PO) [id number] Yes Take 1 tablet by mouth daily. [provider] Taking Active   fluticasone (FLONASE) 50 MCG/ACT nasal spray [id number] Yes INSTILL 1 SPRAY INTO BOTH NOSTRILS DAILY Agapito Games, MD Taking Active   glucose blood (ACCU-CHEK GUIDE) test strip [id number] Yes CHECK BLOOD SUGAR 3 TIMES DAILY. DX: E11.9 Agapito Games, MD Taking Active  hydrocortisone (ANUSOL-HC) 25 MG suppository [id number] Yes Place 25 mg rectally 2 (two) times daily as needed. [provider] Taking Active   Krill Oil 500 MG CAPS [id number] Yes Take 500 mg by mouth daily. [provider] Taking Active Multiple Informants            Med Note Carmin Muskrat Jan 22, 2021  8:48 AM)    levocetirizine (XYZAL) 5 MG tablet [id number] Yes TAKE 1 TABLET BY MOUTH EVERY DAY IN THE Garlon Hatchet, MD Taking Active   Multiple Vitamin (MULTIVITAMIN WITH MINERALS) TABS tablet [id number] Yes Take 1 tablet by mouth daily. [provider] Taking Active Multiple Informants           Med Note Carmin Muskrat Jan 22, 2021  8:48 AM)    ondansetron (ZOFRAN) 4 MG tablet [id number] Yes Take 1 tablet (4 mg total) by mouth 2 (two) times daily. Agapito Games, MD Taking Active            Med Note Ian Malkin, Dennard Schaumann   Tue Jun 14, 2023 11:49 AM) one tablet (4 mg dose) by mouth every 8 (eight) hours as needed for Nausea  perphenazine (TRILAFON) 8 MG tablet [id number] Yes Take 8 mg by mouth at bedtime. [provider] Taking Active   pregabalin (LYRICA) 75 MG capsule [id number] Yes TAKE 1 CAPSULE (75 MG TOTAL) BY MOUTH 2 (TWO) TIMES DAILY. DX:G62.9 Agapito Games, MD Taking Active   promethazine (PHENERGAN) 12.5 MG tablet [id number] Yes Take 12.5 mg by mouth every 6 (six) hours as needed for nausea or vomiting. [provider]  Active   Semaglutide,0.25 or 0.5MG /DOS, (OZEMPIC, 0.25 OR 0.5 MG/DOSE,) 2 MG/1.5ML SOPN [id number] No Inject 0.5 mg into the skin every 7 (seven) days.  Patient not taking: Reported on 06/14/2023   Agapito Games, MD Not Taking Active             Home Care and Equipment/Supplies: Were Home Health Services Ordered?: NA Any new equipment or medical supplies ordered?: NA  Functional Questionnaire: Do you need assistance with bathing/showering or dressing?: No Do you need assistance with meal preparation?: No Do you need assistance with eating?: No Do you have difficulty maintaining continence: No Do you need assistance with getting out of bed/getting out of a chair/moving?: No Do you have difficulty managing or taking your medications?:  No  Follow up appointments reviewed: PCP Follow-up appointment confirmed?: Yes Date of PCP follow-up appointment?: 06/15/23 Follow-up Provider: [facility] Follow-up appointment confirmed?: NA Do you need transportation to your follow-up appointment?: No Do you understand care options if your condition(s) worsen?: Yes-patient verbalized understanding  SDOH Interventions Today    Flowsheet Row Most Recent Value  SDOH Interventions   Food Insecurity Interventions Intervention Not Indicated  Housing Interventions Intervention Not Indicated  Transportation Interventions Intervention Not Indicated, Patient Resources (Friends/Family)  Utilities Interventions Intervention Not Indicated       Goals Addressed             This Visit's Progress    TOC 30 Day Program       Current Barriers:  Knowledge Deficits related to plan of care for management of DMII   RNCM Clinical Goal(s):  Patient will work with the Care Management team over the next 30 days to address Transition of Care Barriers: Medication Management take all medications exactly as prescribed and will call provider for medication related questions as  evidenced by Electronic Health Record attend all scheduled medical appointments: PCP and Specialist as evidenced by Electronic Health Record  through collaboration with RN Care manager, provider, and care team.   Interventions: Evaluation of current treatment plan related to  self management and patient's adherence to plan as established by provider   Diabetes Interventions:  (Status:  New goal.) Long Term Goal Assessed patient's understanding of A1c goal: <6.5% Reviewed medications with patient and discussed importance of medication adherence Lab Results  Component Value Date   HGBA1C 6.0 (A) 03/07/2023    Patient Goals/Self-Care Activities: Participate in Transition of Care Program/Attend [facility] scheduled calls Notify RN Care Manager of TOC call  rescheduling needs Take all medications as prescribed Attend all scheduled provider appointments Call pharmacy for medication refills 3-7 days in advance of running out of medications Perform IADL's (shopping, preparing meals, housekeeping, managing finances) independently Call provider office for new concerns or questions   Follow Up Plan:  Telephone follow up appointment with care management team member scheduled for:  Hilbert Odor 06/22/2023 1:00 The patient has been provided with contact information for the care management team and has been advised to call with any health related questions or concerns.  Next PCP appointment scheduled for: 06/15/2023 10:30        Interventions Today    Flowsheet Row Most Recent Value  Chronic Disease   Chronic disease during today's visit Diabetes, Other  [Nausea and vomiting due to ozempic]  General Interventions   General Interventions Discussed/Reviewed General Interventions Discussed, General Interventions Reviewed, Doctor Visits, Referral to Nurse, Communication with  [referred to Hilbert Odor Case Manager for follow up]  Doctor Visits Discussed/Reviewed Doctor Visits Discussed, Doctor Visits Reviewed, PCP, Specialist  [CT scan revealed loosening of S1 pedicle screw. Recommend follow-up as outpatient]  PCP/Specialist Visits Compliance with follow-up visit  Communication with RN  Education Interventions   Education Provided Provided Education  Provided Verbal Education On Blood Sugar Monitoring, Other, Insurance Plans  [Since blood pressure was low, your blood pressure medication hydrochlorothiazide has been discontinued. Recommend close monitoring of blood pressure after discharge. Monitor blood sugar 2-3 times daily   .]  Pharmacy Interventions   Pharmacy Dicussed/Reviewed Pharmacy Topics Discussed, Pharmacy Topics Reviewed  [Reiterated all the medications that was discharged]  Safety Interventions   Safety Discussed/Reviewed Fall Risk,  Safety Discussed     Patient developed nausea and vomiting due to Ozempic.   Gean Maidens BSN RN [facility] [facility] Health Care Management Coordinator [email or web address] Direct Dial: [phone]  Fax: [phone] Website: [email or web address]

## 2023-06-15 ENCOUNTER — Inpatient Hospital Stay: Payer: 59 | Admitting: Family Medicine

## 2023-06-16 ENCOUNTER — Ambulatory Visit: Payer: 59

## 2023-06-22 ENCOUNTER — Telehealth: Payer: Self-pay

## 2023-06-22 ENCOUNTER — Other Ambulatory Visit: Payer: Self-pay

## 2023-06-22 DIAGNOSIS — M4186 Other forms of scoliosis, lumbar region: Secondary | ICD-10-CM | POA: Diagnosis not present

## 2023-06-22 DIAGNOSIS — M47816 Spondylosis without myelopathy or radiculopathy, lumbar region: Secondary | ICD-10-CM | POA: Diagnosis not present

## 2023-06-22 DIAGNOSIS — M4317 Spondylolisthesis, lumbosacral region: Secondary | ICD-10-CM | POA: Diagnosis not present

## 2023-06-22 DIAGNOSIS — T85192A Other mechanical complication of implanted electronic neurostimulator (electrode) of spinal cord, initial encounter: Secondary | ICD-10-CM | POA: Diagnosis not present

## 2023-06-22 DIAGNOSIS — Z981 Arthrodesis status: Secondary | ICD-10-CM | POA: Diagnosis not present

## 2023-06-22 DIAGNOSIS — M51362 Other intervertebral disc degeneration, lumbar region with discogenic back pain and lower extremity pain: Secondary | ICD-10-CM | POA: Diagnosis not present

## 2023-06-22 NOTE — Patient Instructions (Signed)
 Visit Information  Thank you for taking time to visit with me today.   You stated you feel you are seeing plenty of doctors and declined continued TOC follow up calls and said you'd prefer to call me if you have any questions or concerns.   Please don't hesitate to contact me at the number below, if I can be of assistance to you.   Following is a copy of your care plan:   Goals Addressed             This Visit's Progress    COMPLETED: TOC 30 Day Program       Current Barriers:  Knowledge Deficits related to plan of care for management of DMII 06/22/23 Patient states she feels she is seeing plenty of doctors and declined continued TOC calls  RNCM Clinical Goal(s):  06/22/23 Patient states she feels she is seeing plenty of doctors and declined continued TOC calls  Interventions: 06/22/23 Patient states she feels she is seeing plenty of doctors and declined continued TOC calls   Diabetes Interventions:  (Status:  Patient declined further engagement on this goal.) Short Term Goal 06/22/23 Patient states she feels she is seeing plenty of doctors and declined continued TOC calls   Patient Goals/Self-Care Activities: 06/22/23 Patient states she feels she is seeing plenty of doctors and declined continued TOC calls  Follow Up Plan:  06/22/23 Patient states she feels she is seeing plenty of doctors and declined continued TOC calls The patient has been provided with contact information for the care management team and has been advised to call with any health related questions or concerns.           Patient verbalizes understanding of instructions and care plan provided today and agrees to view in MyChart. Active MyChart status and patient understanding of how to access instructions and care plan via MyChart confirmed with patient.     The patient has been provided with contact information for the care management team and has been advised to call with any health related questions or  concerns.   Please call the care guide team at [phone] if you need to cancel or reschedule your appointment.   Please call the Suicide and Crisis Lifeline: 988 call the [place] National Suicide Prevention Lifeline: (365)407-3975 or TTY: 435-819-5689 TTY 308-776-6668) to talk to a trained counselor call 1-800-273-TALK (toll free, 24 hour hotline) call 911 if you are experiencing a Mental Health or Behavioral Health Crisis or need someone to talk to.  Hilbert Odor RN, CCM [place]  VBCI-Population Health RN Care Manager [phone]

## 2023-06-22 NOTE — Patient Outreach (Signed)
 Care Management  Transitions of Care Program Transitions of Care Post-discharge week 2   06/22/2023 Name: Amanda Morris MRN: [id number] DOB: 1951-02-11  Subjective: Amanda Morris is a 73 y.o. year old female who is a primary care patient of Agapito Games, MD. The Care Management team Engaged with patient Engaged with patient by telephone to assess and address transitions of care needs.   Consent to Services:  Patient was given information about care management services, agreed to services, and gave verbal consent to participate.   Assessment:    Patient states she feels she is seeing plenty of doctors and declined continued TOC calls - Patient confirmed she has TOC RNCM contact information and will call if she has any needs.       SDOH Interventions    Flowsheet Row Telephone from 06/14/2023 in [place] POPULATION HEALTH DEPARTMENT Clinical Support from 02/08/2023 in [facility] Primary Care at [facility] Coordination from 04/28/2022 in [facility] Community Care Coordination Office Visit from 01/26/2021 in [facility] Primary Care & Sports Medicine at [facility] Office Visit from 02/05/2019 in [facility] Primary Care & Sports Medicine at [facility] Clinical Support from 07/11/2018 in [facility] Primary Care & [facility] Medicine at [facility]  SDOH Interventions        Food Insecurity Interventions Intervention Not Indicated Intervention Not Indicated -- Intervention Not Indicated -- --  Housing Interventions Intervention Not Indicated Intervention Not Indicated -- Intervention Not Indicated -- --  Transportation Interventions Intervention Not Indicated, Patient Resources (Friends/Family) -- Intervention Not Indicated Intervention Not Indicated -- --  Utilities Interventions Intervention Not Indicated Intervention Not Indicated -- -- -- --  Alcohol Usage Interventions -- Intervention Not Indicated (Score <7) -- --  -- --  Depression Interventions/Treatment  -- -- -- -- Currently on Treatment Medication  Financial Strain Interventions -- Intervention Not Indicated -- Intervention Not Indicated -- --  Physical Activity Interventions -- Intervention Not Indicated -- Intervention Not Indicated -- --  Stress Interventions -- Intervention Not Indicated Provide Counseling  [declined ongoing support] Intervention Not Indicated -- --  Social Connections Interventions -- -- -- Intervention Not Indicated -- --  Health Literacy Interventions -- Intervention Not Indicated -- -- -- --        Goals Addressed             This Visit's Progress    COMPLETED: TOC 30 Day Program       Current Barriers:  Knowledge Deficits related to plan of care for management of DMII 06/22/23 Patient states she feels she is seeing plenty of doctors and declined continued TOC calls  RNCM Clinical Goal(s):  06/22/23 Patient states she feels she is seeing plenty of doctors and declined continued TOC calls  Interventions: 06/22/23 Patient states she feels she is seeing plenty of doctors and declined continued TOC calls   Diabetes Interventions:  (Status:  Patient declined further engagement on this goal.) Short Term Goal 06/22/23 Patient states she feels she is seeing plenty of doctors and declined continued TOC calls   Patient Goals/Self-Care Activities: 06/22/23 Patient states she feels she is seeing plenty of doctors and declined continued TOC calls  Follow Up Plan:  06/22/23 Patient states she feels she is seeing plenty of doctors and declined continued TOC calls The patient has been provided with contact information for the care management team and has been advised to call with any health related questions or concerns.  Plan: 06/22/23 Patient states she feels she is seeing plenty of doctors and declined continued TOC calls  The patient has been provided with contact information for the care management team and  has been advised to call with any health related questions or concerns.   Hilbert Odor RN, CCM [place]  VBCI-Population Health RN Care Manager [phone]

## 2023-06-23 ENCOUNTER — Encounter: Payer: Self-pay | Admitting: Family Medicine

## 2023-06-23 ENCOUNTER — Ambulatory Visit (INDEPENDENT_AMBULATORY_CARE_PROVIDER_SITE_OTHER): Payer: 59 | Admitting: Family Medicine

## 2023-06-23 VITALS — BP 125/74 | HR 84 | Ht 64.0 in | Wt 179.0 lb

## 2023-06-23 DIAGNOSIS — I10 Essential (primary) hypertension: Secondary | ICD-10-CM

## 2023-06-23 DIAGNOSIS — E1169 Type 2 diabetes mellitus with other specified complication: Secondary | ICD-10-CM | POA: Diagnosis not present

## 2023-06-23 DIAGNOSIS — R1110 Vomiting, unspecified: Secondary | ICD-10-CM

## 2023-06-23 DIAGNOSIS — Z794 Long term (current) use of insulin: Secondary | ICD-10-CM

## 2023-06-23 MED ORDER — EMPAGLIFLOZIN 10 MG PO TABS
10.0000 mg | ORAL_TABLET | Freq: Every day | ORAL | 1 refills | Status: DC
Start: 2023-06-23 — End: 2023-07-08

## 2023-06-23 MED ORDER — ONDANSETRON HCL 4 MG PO TABS: 4.0000 mg | ORAL_TABLET | Freq: Three times a day (TID) | ORAL | 3 refills | Status: DC | PRN

## 2023-06-23 NOTE — Assessment & Plan Note (Signed)
 BP at goal today.

## 2023-06-23 NOTE — Patient Instructions (Signed)
 If you are tolerating the Jardiance well over the next 3 to 4 weeks we can always try increasing the dose to 25 mg.  Make sure to stay well-hydrated.

## 2023-06-23 NOTE — Assessment & Plan Note (Addendum)
 Off of Ozempic secondary to GI symptoms that have worsened her gastroparesis.  She was previously on Jardiance and glipizide before switching to Ozempic.  I am going to try restarting a low dose Jardiance.  She says she does eat regularly.  Continue to work on [employer].

## 2023-06-23 NOTE — Progress Notes (Signed)
 Established Patient Office Visit  Subjective  Patient ID: Amanda Morris, female    DOB: 1950-08-14  Age: 73 y.o. MRN: [id number]  Chief Complaint  Patient presents with   Hospitalization Follow-up    HPI  Amanda Morris is here today for hospital follow-up from [facility] health.  She was admitted for intractable nausea and vomiting on February 7.  She was discharged home 9 days later on February 15.  She has a history of type 2 diabetes that is well-controlled, GERD, hiatal hernia and gastroparesis.  She has had some GI issues more chronically but it became worse to the point that she went to the emergency room.  They felt like her nausea vomiting abdominal pain was being exacerbated by her Ozempic so it was discontinued.  CT abdomen pelvis was negative.  Lipase was normal.  And she was treated with antiemetics.  She also had a fever of 101 upon admission she had a negative respiratory viral panel.  She was treated with IV Rocephin and Flagyl to cover for UTI or pneumonia.  Blood cultures were negative.  And then she was discharged home to complete cefdinir.  Chest x-ray did reveal mild bibasilar atelectasis versus pneumonia.  She was mildly hypotensive so her HCTZ was not given while she was in the hospital but they did encourage her to restart at discharge.  She also has loosening of her S1 pedicle screw and is going to have a follow-up with Dr. Manson Passey in that regard also her stimulator is no longer charging and so we will address that with them as well.  Did refer her to Dr. Morrison Old and she has an appointment in March.  Hospital notes reviewed.    ROS    Objective:     BP 125/74   Pulse 84   Ht 5\' 4"  (1.626 m)   Wt 179 lb (81.2 kg)   LMP  (LMP Unknown)   SpO2 98%   BMI 30.73 kg/m    Physical Exam Vitals and nursing note reviewed.  Constitutional:      Appearance: Normal appearance.  HENT:     Head: Normocephalic and atraumatic.  Eyes:     Conjunctiva/sclera: Conjunctivae  normal.  Cardiovascular:     Rate and Rhythm: Normal rate and regular rhythm.  Pulmonary:     Effort: Pulmonary effort is normal.     Breath sounds: Normal breath sounds.  Skin:    General: Skin is warm and dry.  Neurological:     Mental Status: She is alert.  Psychiatric:        Mood and Affect: Mood normal.      No results found for any visits on 06/23/23.    The ASCVD Risk score (Arnett DK, et al., 2019) failed to calculate for the following reasons:   The valid total cholesterol range is 130 to 320 mg/dL    Assessment & Plan:   Problem List Items Addressed This Visit       Cardiovascular and Mediastinum   Essential hypertension   BP at goal today.       Relevant Orders   BMP8+EGFR     Digestive   Postprandial vomiting   Relevant Medications   ondansetron (ZOFRAN) 4 MG tablet     Endocrine   Type 2 diabetes mellitus with other specified complication (HCC) - Primary   Off of Ozempic secondary to GI symptoms that have worsened her gastroparesis.  She was previously on Jardiance and glipizide before switching to Ozempic.  I am going to try restarting a low dose Jardiance.  She says she does eat regularly.  Continue to work on [employer].      Relevant Medications   empagliflozin (JARDIANCE) 10 MG TABS tablet   Other Relevant Orders   BMP8+EGFR    Return in about 3 months (around 09/20/2023) for Diabetes follow-up.    Nani Gasser, MD

## 2023-06-23 NOTE — Progress Notes (Signed)
 Pt has a f/u visit with Dr. Morrison Old on 3/17

## 2023-06-24 ENCOUNTER — Encounter: Payer: Self-pay | Admitting: Family Medicine

## 2023-06-24 LAB — BMP8+EGFR
BUN/Creatinine Ratio: 7 — ABNORMAL LOW (ref 12–28)
BUN: 5 mg/dL — ABNORMAL LOW (ref 8–27)
CO2: 27 mmol/L (ref 20–29)
Calcium: 8.7 mg/dL (ref 8.7–10.3)
Chloride: 104 mmol/L (ref 96–106)
Creatinine, Ser: 0.69 mg/dL (ref 0.57–1.00)
Glucose: 197 mg/dL — ABNORMAL HIGH (ref 70–99)
Potassium: 3.6 mmol/L (ref 3.5–5.2)
Sodium: 146 mmol/L — ABNORMAL HIGH (ref 134–144)
eGFR: 93 mL/min/{1.73_m2} (ref >59)

## 2023-06-24 NOTE — Progress Notes (Signed)
 Hi [name], your BUN is low and your sodium is high.  I know you mentioned that you do drink a lot of fluids.  How many ounces of water/fluid do you think you drink a day?  We might actually need to decrease that slightly and then recheck your labs.

## 2023-06-29 ENCOUNTER — Ambulatory Visit: Payer: 59 | Admitting: Family Medicine

## 2023-07-05 ENCOUNTER — Ambulatory Visit: Payer: Self-pay | Admitting: Family Medicine

## 2023-07-05 DIAGNOSIS — Z79899 Other long term (current) drug therapy: Secondary | ICD-10-CM | POA: Diagnosis not present

## 2023-07-05 DIAGNOSIS — G43709 Chronic migraine without aura, not intractable, without status migrainosus: Secondary | ICD-10-CM | POA: Diagnosis not present

## 2023-07-05 DIAGNOSIS — M7918 Myalgia, other site: Secondary | ICD-10-CM | POA: Diagnosis not present

## 2023-07-05 NOTE — Telephone Encounter (Signed)
 2nd attempt. Call cannot be completed at this time.   Message from Nila Nephew sent at 07/05/2023  9:53 AM EDT  Summary: PW: Blood Sugar   Copied From CRM #[id number]. Reason for Triage: Patient is calling in to return a call regarding results. Patient states off-handed that her blood sugar has been high, bouncing around between the ranges fo 175 and 300.

## 2023-07-05 NOTE — Telephone Encounter (Signed)
 1st attempt. No answer, no voicemail or answering machine.   Message from Amanda Morris sent at 07/05/2023  9:53 AM EDT  Summary: PW: Blood Sugar   Copied From CRM #[id number]. Reason for Triage: Patient is calling in to return a call regarding results. Patient states off-handed that her blood sugar has been high, bouncing around between the ranges fo 175 and 300.

## 2023-07-05 NOTE — Telephone Encounter (Signed)
 3rd attempt. Call cannot be completed at this time. Routing for follow up.   Message from Nila Nephew sent at 07/05/2023  9:53 AM EDT  Summary: PW: Blood Sugar   Copied From CRM #[id number]. Reason for Triage: Patient is calling in to return a call regarding results. Patient states off-handed that her blood sugar has been high, bouncing around between the ranges fo 175 and 300.

## 2023-07-07 ENCOUNTER — Other Ambulatory Visit: Payer: Self-pay | Admitting: Family Medicine

## 2023-07-07 DIAGNOSIS — R1013 Epigastric pain: Secondary | ICD-10-CM | POA: Diagnosis not present

## 2023-07-07 DIAGNOSIS — I10 Essential (primary) hypertension: Secondary | ICD-10-CM | POA: Diagnosis not present

## 2023-07-07 DIAGNOSIS — R110 Nausea: Secondary | ICD-10-CM | POA: Diagnosis not present

## 2023-07-07 DIAGNOSIS — K921 Melena: Secondary | ICD-10-CM | POA: Diagnosis not present

## 2023-07-07 DIAGNOSIS — K2100 Gastro-esophageal reflux disease with esophagitis, without bleeding: Secondary | ICD-10-CM

## 2023-07-07 DIAGNOSIS — D509 Iron deficiency anemia, unspecified: Secondary | ICD-10-CM | POA: Diagnosis not present

## 2023-07-07 DIAGNOSIS — Z860100 Personal history of colon polyps, unspecified: Secondary | ICD-10-CM | POA: Diagnosis not present

## 2023-07-07 DIAGNOSIS — K589 Irritable bowel syndrome without diarrhea: Secondary | ICD-10-CM | POA: Diagnosis not present

## 2023-07-07 DIAGNOSIS — R194 Change in bowel habit: Secondary | ICD-10-CM | POA: Diagnosis not present

## 2023-07-07 DIAGNOSIS — K219 Gastro-esophageal reflux disease without esophagitis: Secondary | ICD-10-CM | POA: Diagnosis not present

## 2023-07-08 ENCOUNTER — Ambulatory Visit: Payer: Self-pay | Admitting: Family Medicine

## 2023-07-08 ENCOUNTER — Telehealth: Payer: Self-pay

## 2023-07-08 DIAGNOSIS — E1169 Type 2 diabetes mellitus with other specified complication: Secondary | ICD-10-CM

## 2023-07-08 MED ORDER — EMPAGLIFLOZIN 10 MG PO TABS: 10.0000 mg | ORAL_TABLET | Freq: Every day | ORAL | 1 refills | Status: DC

## 2023-07-08 NOTE — Telephone Encounter (Signed)
 Copied from CRM [id number]. Topic: Clinical - Red Word Triage >> Jul 08, 2023  3:23 PM Louie Casa B wrote: Red Word that prompted transfer to Nurse Triage: patient says that her sugar is going up from 140 to 339   Chief Complaint: hyperglycemia Symptoms: dizzy Frequency: intermittent Pertinent Negatives: Patient denies fever Disposition: [] ED /[] Urgent Care (no appt availability in office) / [] Appointment(In office/virtual)/ []  [place] [facility]/ [] Home Care/ [] Refused Recommended Disposition /[]  Mobile Bus/ []  Follow-up with PCP Additional Notes: Patient reports that her BG has been fluctuating, 140s-330s, BG is currently: 367. Pt sts that she is not taking anything for diabetes. Pt sts that she never filled her Jardiance so she has not been taking anything. [facility] included in what was dispensed. [facility] to [facility] but was unable to speak with someone directly. Call made to [facility], spoke with Marylene Land who will contact pharmacy and follow-up with patient. Pt verbalized understanding. Reason for Disposition  [1] Blood glucose > 300 mg/dL (62.9 mmol/L) AND [5] two or more times in a row  Answer Assessment - Initial Assessment Questions 1. BLOOD GLUCOSE: "What is your blood glucose level?"     367  2. ONSET: "When did you check the blood glucose?"     3:30pm  3. USUAL RANGE: "What is your glucose level usually?" (e.g., usual fasting morning value, usual evening value)     Under 120  4. KETONES: "Do you check for ketones (urine or blood test strips)?" If Yes, ask: "What does the test show now?"      No  5. TYPE 1 or 2:  "Do you know what type of diabetes you have?"  (e.g., Type 1, Type 2, Gestational; doesn't know)      Type 2 DM  6. INSULIN: "Do you take insulin?" "What type of insulin(s) do you use? What is the mode of delivery? (syringe, pen; injection or pump)?"      No, not taking anything at  all  7. DIABETES PILLS: "Do you take any pills for your diabetes?" If Yes, ask: "Have you missed taking any pills recently?"     No  8. OTHER SYMPTOMS: "Do you have any symptoms?" (e.g., fever, frequent urination, difficulty breathing, dizziness, weakness, vomiting)     No symptoms at this time, but has had dizzy spells. States that she fell yesterday  9. PREGNANCY: "Is there any chance you are pregnant?" "When was your last menstrual period?"     no  Protocols used: Diabetes - High Blood Sugar-A-AH

## 2023-07-08 NOTE — Telephone Encounter (Signed)
 Caller not on the line after transfer from agent, attempted to call back but unable to reach patient.   Copied from CRM [id number]. Topic: Clinical - Red Word Triage >> Jul 08, 2023  3:17 PM Gery Pray wrote: Red Word that prompted transfer to Nurse Triage: Patient glucose levels are in the 300s. Patient is experiencing dizziness with high glucose levels.

## 2023-07-08 NOTE — Telephone Encounter (Signed)
 Copied from CRM [id number]. Topic: Clinical - Lab/Test Results >> Jul 05, 2023  9:51 AM Amanda Morris wrote: Reason for CRM: Patient returning call to [facility]. Patient requesting a call back regarding lab results.

## 2023-07-08 NOTE — Telephone Encounter (Signed)
 Sent medication to different pharmacy. I called and confirmed they received the prescription and that it went through insurance. They will get it ready for patient. Patient is aware.

## 2023-07-11 ENCOUNTER — Ambulatory Visit: Payer: Self-pay | Admitting: Family Medicine

## 2023-07-11 NOTE — Telephone Encounter (Signed)
 Copied from CRM [id number]. Topic: Clinical - Red Word Triage >> Jul 11, 2023  9:49 AM Nila Nephew wrote: Red Word that prompted transfer to Nurse Triage: Patient is calling to request information. Patient states her medicine is not keeping her blood sugar down. She states that when she took it this morning, it was 218 prior to eating anything.  Chief Complaint: Elevated BS Symptoms: Nausea Frequency: Today Pertinent Negatives: Patient denies additional symptoms Disposition: [] ED /[] Urgent Care (no appt availability in office) / [x] Appointment(In office/virtual)/ []  [place] [facility]/ [] Home Care/ [] Refused Recommended Disposition /[] [place] Mobile [facility]/ []  Follow-up with PCP Additional Notes: Patient called in to report an elevated blood glucose of 218. Patient stated she measured her blood glucose when she woke up this morning. Patient stated her diabetes medications have recently changed and she started taking Jardiance on Thursday of last week. Patient denied all of the following symptoms: fever, difficulty breathing, dizziness, weakness, vomiting and blurred vision. Patient did state she feels nauseous today. Patient stated that she has not had a blood glucose reading over 300, since the beginning of March. This RN advised home care at this time, per protocol. This RN provided education to patient. This RN advised patient to call back if her blood glucose measures > 300 two times in a row. This RN advised patient to call back if symptoms develop. Patient complied.   Reason for Disposition  Blood glucose 70-240 mg/dL (3.9 -87.5 mmol/L)  Answer Assessment - Initial Assessment Questions 1. BLOOD GLUCOSE: "What is your blood glucose level?"      218 (this morning) 2. ONSET: "When did you check the blood glucose?"     Taken this morning before eating 3. USUAL RANGE: "What is your glucose level usually?" (e.g., usual fasting morning value, usual evening value)     States it usually runs from  76-170 4. KETONES: "Do you check for ketones (urine or blood test strips)?" If Yes, ask: "What does the test show now?"      Denies 5. TYPE 1 or 2:  "Do you know what type of diabetes you have?"  (e.g., Type 1, Type 2, Gestational; doesn't know)      Type 6. INSULIN: "Do you take insulin?" "What type of insulin(s) do you use? What is the mode of delivery? (syringe, pen; injection or pump)?"      Denies taking insulin, states Jardiance is the only medication she takes to manage diabetes 7. DIABETES PILLS: "Do you take any pills for your diabetes?" If Yes, ask: "Have you missed taking any pills recently?"     Denies 8. OTHER SYMPTOMS: "Do you have any symptoms?" (e.g., fever, frequent urination, difficulty breathing, dizziness, weakness, vomiting)     Denies difficulty breathing, denies dizziness, denies weakness, denies vomiting, denies blurred vision, states she is nauseous, states she fell on Thursday or Friday of last week  Protocols used: Diabetes - High Blood Sugar-A-AH

## 2023-08-04 ENCOUNTER — Ambulatory Visit: Payer: Self-pay

## 2023-08-04 NOTE — Telephone Encounter (Signed)
 Copied from CRM [id number]. Topic: Clinical - Red Word Triage >> Aug 04, 2023  1:41 PM Prudencio Pair wrote: Red Word that prompted transfer to Nurse Triage: Patient states she fell yesterday on her face and her right eye has a black spot. She states it is painful to touch.  Chief Complaint: fall and landed on face, right eye black Symptoms: pain Frequency: constant Pertinent Negatives: Patient denies dizziness, fever, other injuries Disposition: [x] ED /[] Urgent Care (no appt availability in office) / [] Appointment(In office/virtual)/ []  [place] [facility]/ [] Home Care/ [] Refused Recommended Disposition /[] [place] [facility]/ []  Follow-up with PCP Additional Notes: instructed to go to the ER.  PCP office updated.   Reason for Disposition  Injury (or injuries) that need emergency care  Answer Assessment - Initial Assessment Questions 1. MECHANISM: "How did the fall happen?"     Yesterday evening, trying to get a case of water in home, fell and landed face. 2. DOMESTIC VIOLENCE AND ELDER ABUSE SCREENING: "Did you fall because someone pushed you or tried to hurt you?" If Yes, ask: "Are you safe now?"     no 3. ONSET: "When did the fall happen?" (e.g., minutes, hours, or days ago)     yesterday 4. LOCATION: "What part of the body hit the ground?" (e.g., back, buttocks, head, hips, knees, hands, head, stomach)     Face, and right eye black 5. INJURY: "Did you hurt (injure) yourself when you fell?" If Yes, ask: "What did you injure? Tell me more about this?" (e.g., body area; type of injury; pain severity)"     Yes, eye 6. PAIN: "Is there any pain?" If Yes, ask: "How bad is the pain?" (e.g., Scale 1-10; or mild,  moderate, severe)   - NONE (0): No pain   - MILD (1-3): Doesn't interfere with normal activities    - MODERATE (4-7): Interferes with normal activities or awakens from sleep    - SEVERE (8-10): Excruciating pain, unable to do any normal activities      Painful when she touches  it 7. SIZE: For cuts, bruises, or swelling, ask: "How large is it?" (e.g., inches or centimeters)      na 8. PREGNANCY: "Is there any chance you are pregnant?" "When was your last menstrual period?"     na 9. OTHER SYMPTOMS: "Do you have any other symptoms?" (e.g., dizziness, fever, weakness; new onset or worsening).      denies 10. CAUSE: "What do you think caused the fall (or falling)?" (e.g., tripped, dizzy spell)       Larey Seat.  Protocols used: Falls and [facility]

## 2023-08-05 DIAGNOSIS — Z7982 Long term (current) use of aspirin: Secondary | ICD-10-CM | POA: Diagnosis not present

## 2023-08-05 DIAGNOSIS — S0081XA Abrasion of other part of head, initial encounter: Secondary | ICD-10-CM | POA: Diagnosis not present

## 2023-08-05 DIAGNOSIS — Z79899 Other long term (current) drug therapy: Secondary | ICD-10-CM | POA: Diagnosis not present

## 2023-08-05 DIAGNOSIS — S0990XA Unspecified injury of head, initial encounter: Secondary | ICD-10-CM | POA: Diagnosis not present

## 2023-08-05 DIAGNOSIS — S098XXA Other specified injuries of head, initial encounter: Secondary | ICD-10-CM | POA: Diagnosis not present

## 2023-08-05 DIAGNOSIS — Z87891 Personal history of nicotine dependence: Secondary | ICD-10-CM | POA: Diagnosis not present

## 2023-08-11 NOTE — Progress Notes (Signed)
 [facility] Quality Team Note  Name: Amanda Morris Date of Birth: [birth year] MRN: [id number] Date: 08/11/2023  [facility] Quality Team has reviewed this patient's chart, please see recommendations below:  [facility] Quality Other; (Patient needs urine microalbumin/creatinine ratio completed at upcoming office visit 09/20/2023. Patient will also need new A1c 9.0 or less for gap closure)

## 2023-08-15 ENCOUNTER — Ambulatory Visit: Payer: Self-pay

## 2023-08-15 DIAGNOSIS — R222 Localized swelling, mass and lump, trunk: Secondary | ICD-10-CM | POA: Diagnosis not present

## 2023-08-15 DIAGNOSIS — Z981 Arthrodesis status: Secondary | ICD-10-CM | POA: Diagnosis not present

## 2023-08-15 DIAGNOSIS — M4319 Spondylolisthesis, multiple sites in spine: Secondary | ICD-10-CM | POA: Diagnosis not present

## 2023-08-15 DIAGNOSIS — M4316 Spondylolisthesis, lumbar region: Secondary | ICD-10-CM | POA: Diagnosis not present

## 2023-08-15 DIAGNOSIS — M4804 Spinal stenosis, thoracic region: Secondary | ICD-10-CM | POA: Diagnosis not present

## 2023-08-15 DIAGNOSIS — M4315 Spondylolisthesis, thoracolumbar region: Secondary | ICD-10-CM | POA: Diagnosis not present

## 2023-08-15 DIAGNOSIS — M438X6 Other specified deforming dorsopathies, lumbar region: Secondary | ICD-10-CM | POA: Diagnosis not present

## 2023-08-15 DIAGNOSIS — M47816 Spondylosis without myelopathy or radiculopathy, lumbar region: Secondary | ICD-10-CM | POA: Diagnosis not present

## 2023-08-15 DIAGNOSIS — M5187 Other intervertebral disc disorders, lumbosacral region: Secondary | ICD-10-CM | POA: Diagnosis not present

## 2023-08-15 DIAGNOSIS — M47814 Spondylosis without myelopathy or radiculopathy, thoracic region: Secondary | ICD-10-CM | POA: Diagnosis not present

## 2023-08-15 DIAGNOSIS — M549 Dorsalgia, unspecified: Secondary | ICD-10-CM | POA: Diagnosis not present

## 2023-08-15 DIAGNOSIS — M4317 Spondylolisthesis, lumbosacral region: Secondary | ICD-10-CM | POA: Diagnosis not present

## 2023-08-15 DIAGNOSIS — M4807 Spinal stenosis, lumbosacral region: Secondary | ICD-10-CM | POA: Diagnosis not present

## 2023-08-15 NOTE — Telephone Encounter (Signed)
 Copied from CRM [id number]. Topic: Clinical - Red Word Triage >> Aug 15, 2023 11:23 AM Shelby Dessert H wrote: Patient called today because her blood sugar is running between 167 to 337, she is requesting something else to take, patient is having some headaches, dizzy spells, pain level 1-10 level is 10 in her legs and in her back, patients sugar has been going up and down really high every since she stopped taking ozempic .   Chief Complaint: Elevated blood sugar Symptoms: No symptoms now Frequency: Intermittent  Pertinent Negatives: Patient denies any symptoms at this time  Disposition: [] ED /[] Urgent Care (no appt availability in office) / [x] Appointment(In office/virtual)/ []  [place] [facility]/ [] Home Care/ [] Refused Recommended Disposition /[] [place] Mobile [facility]/ [x]  Follow-up with PCP Additional Notes: Patient reports that since she stopped taking Ozempic  her blood sugars have been elevated. She states that her blood sugar this morning was 422 when she woke up, and is now 192. She states that she has intermittent headaches and leg and back pain, but is not having any symptoms at this time. Patient would like an appointment to discuss her blood sugars. Appointment made for the patient on Wednesday for evaluation.     Reason for Disposition  Blood glucose 70-240 mg/dL (3.9 -95.6 mmol/L)  Answer Assessment - Initial Assessment Questions 1. BLOOD GLUCOSE: "What is your blood glucose level?"      422 this morning, now 192 2. ONSET: "When did you check the blood glucose?"     Just took it for 192 3. USUAL RANGE: "What is your glucose level usually?" (e.g., usual fasting morning value, usual evening value)     95-130's 4. KETONES: "Do you check for ketones (urine or blood test strips)?" If Yes, ask: "What does the test show now?"      No 5. TYPE 1 or 2:  "Do you know what type of diabetes you have?"  (e.g., Type 1, Type 2, Gestational; doesn't know)      Type 2 6. INSULIN : "Do you take  insulin ?" "What type of insulin (s) do you use? What is the mode of delivery? (syringe, pen; injection or pump)?"      No 7. DIABETES PILLS: "Do you take any pills for your diabetes?" If Yes, ask: "Have you missed taking any pills recently?"     Januvia , has not missed any doses  8. OTHER SYMPTOMS: "Do you have any symptoms?" (e.g., fever, frequent urination, difficulty breathing, dizziness, weakness, vomiting)     Not currently  Protocols used: Diabetes - High Blood Sugar-A-AH

## 2023-08-15 NOTE — Telephone Encounter (Signed)
 Patient scheduled for 08/17/23 with Dr. Greer Leak

## 2023-08-17 ENCOUNTER — Encounter: Payer: Self-pay | Admitting: Family Medicine

## 2023-08-17 ENCOUNTER — Ambulatory Visit (INDEPENDENT_AMBULATORY_CARE_PROVIDER_SITE_OTHER): Admitting: Family Medicine

## 2023-08-17 VITALS — BP 117/75 | HR 88 | Ht 64.0 in | Wt 187.0 lb

## 2023-08-17 DIAGNOSIS — G6289 Other specified polyneuropathies: Secondary | ICD-10-CM

## 2023-08-17 DIAGNOSIS — E1169 Type 2 diabetes mellitus with other specified complication: Secondary | ICD-10-CM

## 2023-08-17 DIAGNOSIS — Z794 Long term (current) use of insulin: Secondary | ICD-10-CM | POA: Diagnosis not present

## 2023-08-17 DIAGNOSIS — I10 Essential (primary) hypertension: Secondary | ICD-10-CM | POA: Diagnosis not present

## 2023-08-17 DIAGNOSIS — R1110 Vomiting, unspecified: Secondary | ICD-10-CM | POA: Diagnosis not present

## 2023-08-17 DIAGNOSIS — K2100 Gastro-esophageal reflux disease with esophagitis, without bleeding: Secondary | ICD-10-CM

## 2023-08-17 MED ORDER — EMPAGLIFLOZIN 25 MG PO TABS: 25.0000 mg | ORAL_TABLET | Freq: Every day | ORAL | 1 refills | Status: DC

## 2023-08-17 MED ORDER — PREGABALIN 75 MG PO CAPS: 75.0000 mg | ORAL_CAPSULE | Freq: Two times a day (BID) | ORAL | 4 refills | Status: DC

## 2023-08-17 MED ORDER — GLIPIZIDE 5 MG PO TABS: 5.0000 mg | ORAL_TABLET | Freq: Two times a day (BID) | ORAL | 1 refills | Status: DC

## 2023-08-17 NOTE — Assessment & Plan Note (Signed)
 Sure to get back in with GI she sees Arnetta Lank pleasant especially since she has been off the GLP-1 for 3 months now and she is still having significant symptoms.  I suspect she has gastroparesis.

## 2023-08-17 NOTE — Assessment & Plan Note (Signed)
 Currently on Dexilant but still having some nausea and occasional vomiting.  She also has a prescription for dicyclomine .

## 2023-08-17 NOTE — Progress Notes (Signed)
 Established Patient Office Visit  Subjective  Patient ID: Amanda Morris, female    DOB: [birth year]  Age: 73 y.o. MRN: [id number]  Chief Complaint  Patient presents with   Diabetes    HPI  Here today for diabetes follow-up.  Blood sugars have been more elevated since she came off of the Ozempic . She was seen in the emergency department recently on April 11 for a head injury after she fell losing her balance while carrying some water into her house.  He also had a facial abrasion.  No loss of consciousness.  Head CT negative.  She is due for a refill on her Lyrica .  She ran out of refills about a week ago.  She says the pharmacy did reach out to us .   She still having significant nausea and occasional vomiting she says is not as bad as it was.  She does not have an upcoming appointment with gastroenterology she normally sees [employer].  She has been off of the GLP-1 for almost 3 months now.  He is-so far tolerating the low-dose of Jardiance .  She thinks she needs to restart her other old pill as well that she did well with.  At home blood Kos log.  Some blood sugars in the 200s and even 300s.  Lab Results  Component Value Date   HGBA1C 6.0 (A) 03/07/2023        ROS    Objective:     BP 117/75   Pulse 88   Ht 5\' 4"  (1.626 m)   Wt 187 lb (84.8 kg)   LMP  (LMP Unknown)   SpO2 97%   BMI 32.10 kg/m    Physical Exam Vitals and nursing note reviewed.  Constitutional:      Appearance: Normal appearance.  HENT:     Head: Normocephalic and atraumatic.  Eyes:     Conjunctiva/sclera: Conjunctivae normal.  Cardiovascular:     Rate and Rhythm: Normal rate and regular rhythm.  Pulmonary:     Effort: Pulmonary effort is normal.     Breath sounds: Normal breath sounds.  Skin:    General: Skin is warm and dry.  Neurological:     Mental Status: She is alert.  Psychiatric:        Mood and Affect: Mood normal.      No results found for any visits on  08/17/23.    The ASCVD Risk score (Arnett DK, et al., 2019) failed to calculate for the following reasons:   The valid total cholesterol range is 130 to 320 mg/dL    Assessment & Plan:   Problem List Items Addressed This Visit       Cardiovascular and Mediastinum   Essential hypertension - Primary   Well controlled. Continue current regimen. Follow up in  6 mo         Digestive   Postprandial vomiting   Sure to get back in with GI she sees Arnetta Lank pleasant especially since she has been off the GLP-1 for 3 months now and she is still having significant symptoms.  I suspect she has gastroparesis.      GERD (gastroesophageal reflux disease)   Currently on Dexilant but still having some nausea and occasional vomiting.  She also has a prescription for dicyclomine .        Endocrine   Type 2 diabetes mellitus with other specified complication (HCC)   Aricept jumped up significantly.  Will increase Jardiance  to 25 mg and she would really  like to restart the glipizide  she did well on it previously.  Will follow-up in 3 months.  Continue to work on healthy food choices just encouraged her to eat small amounts at 1 time.      Relevant Medications   glipiZIDE  (GLUCOTROL ) 5 MG tablet   empagliflozin  (JARDIANCE ) 25 MG TABS tablet   Other Relevant Orders   Urine Microalbumin w/creat. ratio     Nervous and Auditory   Peripheral neuropathy   Doing well with the Lyrica  as she is just out of medication so we will update the prescription.  Continue with duloxetine  as well.      Relevant Medications   DULoxetine  (CYMBALTA ) 60 MG capsule   pregabalin  (LYRICA ) 75 MG capsule     Return in about 3 months (around 11/16/2023) for Diabetes follow-up, New start medication.    Duaine German, MD

## 2023-08-17 NOTE — Assessment & Plan Note (Signed)
 Aricept jumped up significantly.  Will increase Jardiance  to 25 mg and she would really like to restart the glipizide  she did well on it previously.  Will follow-up in 3 months.  Continue to work on healthy food choices just encouraged her to eat small amounts at 1 time.

## 2023-08-17 NOTE — Assessment & Plan Note (Signed)
 Doing well with the Lyrica  as she is just out of medication so we will update the prescription.  Continue with duloxetine  as well.

## 2023-08-17 NOTE — Assessment & Plan Note (Signed)
 Well controlled. Continue current regimen. Follow up in  6 mo

## 2023-08-18 ENCOUNTER — Ambulatory Visit: Payer: 59 | Admitting: Podiatry

## 2023-08-18 LAB — MICROALBUMIN / CREATININE URINE RATIO
Creatinine, Urine: 155.8 mg/dL
Microalb/Creat Ratio: 9 mg/g{creat} (ref 0–29)
Microalbumin, Urine: 14.6 ug/mL

## 2023-08-19 ENCOUNTER — Encounter: Payer: Self-pay | Admitting: Family Medicine

## 2023-08-19 NOTE — Progress Notes (Signed)
 No excess protein in the urine which is good.

## 2023-08-23 DIAGNOSIS — M5030 Other cervical disc degeneration, unspecified cervical region: Secondary | ICD-10-CM | POA: Diagnosis not present

## 2023-08-23 DIAGNOSIS — M47816 Spondylosis without myelopathy or radiculopathy, lumbar region: Secondary | ICD-10-CM | POA: Diagnosis not present

## 2023-08-27 ENCOUNTER — Other Ambulatory Visit: Payer: Self-pay | Admitting: Family Medicine

## 2023-08-27 DIAGNOSIS — K2100 Gastro-esophageal reflux disease with esophagitis, without bleeding: Secondary | ICD-10-CM

## 2023-08-27 DIAGNOSIS — M898X1 Other specified disorders of bone, shoulder: Secondary | ICD-10-CM

## 2023-08-27 DIAGNOSIS — R079 Chest pain, unspecified: Secondary | ICD-10-CM

## 2023-09-06 ENCOUNTER — Telehealth: Payer: Self-pay

## 2023-09-06 NOTE — Telephone Encounter (Signed)
 Received Ozempic  via patient assistance program  But in chart- office visit from 06/23/23 states  Type 2 diabetes mellitus with other specified complication (HCC) - Primary     Off of Ozempic  secondary to GI symptoms that have worsened her gastroparesis.  She was previously on Jardiance  and glipizide  before switching to Ozempic .  I am going to try restarting a low dose Jardiance .  She says she does eat regularly.  Continue to work on [employer].     And Ozempic  not showing in patient current med list.  Is the patient to restart this medication ?

## 2023-09-07 NOTE — Telephone Encounter (Signed)
 No,  she stopped it because of side effects.  So we may need to call the manufacture and see what to do with the medication. we may have to send it back I am not sure.

## 2023-09-07 NOTE — Telephone Encounter (Signed)
 Please review the previous notes regarding Ozempic  rx from [facility]. Thanks in advance.

## 2023-09-13 NOTE — Telephone Encounter (Signed)
Error this encounter not needed

## 2023-09-16 DIAGNOSIS — R112 Nausea with vomiting, unspecified: Secondary | ICD-10-CM | POA: Diagnosis not present

## 2023-09-16 DIAGNOSIS — R1013 Epigastric pain: Secondary | ICD-10-CM | POA: Diagnosis not present

## 2023-09-16 DIAGNOSIS — I10 Essential (primary) hypertension: Secondary | ICD-10-CM | POA: Diagnosis not present

## 2023-09-16 NOTE — Telephone Encounter (Signed)
 We still haven't received the return labels from [facility] for the Ozempic  shipment for this patient. There are currently 8 boxes (0.25/0.5 mg) stored. Can you check to see if the labels were mailed to the clinic. Thanks in advance.

## 2023-09-20 ENCOUNTER — Ambulatory Visit: Payer: 59 | Admitting: Family Medicine

## 2023-09-20 NOTE — Telephone Encounter (Signed)
 No. Not yet. When I do get the labels, any particular instructions or directions when sending back the Ozempic ? Cold packaging or regular box?

## 2023-09-20 NOTE — Telephone Encounter (Signed)
 We do not have any supplies for a viable cold packaging. Are you able to assist? Thanks in advance.

## 2023-09-22 NOTE — Telephone Encounter (Signed)
 Okay. Noted. Thank you for the information.

## 2023-09-23 DIAGNOSIS — R9431 Abnormal electrocardiogram [ECG] [EKG]: Secondary | ICD-10-CM | POA: Diagnosis not present

## 2023-09-23 DIAGNOSIS — E119 Type 2 diabetes mellitus without complications: Secondary | ICD-10-CM | POA: Diagnosis not present

## 2023-09-23 DIAGNOSIS — M25512 Pain in left shoulder: Secondary | ICD-10-CM | POA: Diagnosis not present

## 2023-09-23 DIAGNOSIS — Z556 Problems related to health literacy: Secondary | ICD-10-CM | POA: Diagnosis not present

## 2023-09-23 DIAGNOSIS — R1013 Epigastric pain: Secondary | ICD-10-CM | POA: Diagnosis not present

## 2023-09-23 DIAGNOSIS — I6781 Acute cerebrovascular insufficiency: Secondary | ICD-10-CM | POA: Diagnosis not present

## 2023-09-23 DIAGNOSIS — Z87891 Personal history of nicotine dependence: Secondary | ICD-10-CM | POA: Diagnosis not present

## 2023-09-23 DIAGNOSIS — R519 Headache, unspecified: Secondary | ICD-10-CM | POA: Diagnosis not present

## 2023-09-23 DIAGNOSIS — M19012 Primary osteoarthritis, left shoulder: Secondary | ICD-10-CM | POA: Diagnosis not present

## 2023-09-23 DIAGNOSIS — M5412 Radiculopathy, cervical region: Secondary | ICD-10-CM | POA: Diagnosis not present

## 2023-09-23 DIAGNOSIS — M542 Cervicalgia: Secondary | ICD-10-CM | POA: Diagnosis not present

## 2023-09-23 DIAGNOSIS — R112 Nausea with vomiting, unspecified: Secondary | ICD-10-CM | POA: Diagnosis not present

## 2023-09-25 ENCOUNTER — Other Ambulatory Visit: Payer: Self-pay | Admitting: Family Medicine

## 2023-09-25 DIAGNOSIS — R079 Chest pain, unspecified: Secondary | ICD-10-CM

## 2023-09-25 DIAGNOSIS — M898X1 Other specified disorders of bone, shoulder: Secondary | ICD-10-CM

## 2023-09-28 ENCOUNTER — Other Ambulatory Visit: Payer: Self-pay | Admitting: Family Medicine

## 2023-09-28 DIAGNOSIS — K2100 Gastro-esophageal reflux disease with esophagitis, without bleeding: Secondary | ICD-10-CM

## 2023-09-29 ENCOUNTER — Other Ambulatory Visit: Payer: Self-pay

## 2023-09-29 ENCOUNTER — Telehealth: Payer: Self-pay | Admitting: Family Medicine

## 2023-09-29 DIAGNOSIS — E1169 Type 2 diabetes mellitus with other specified complication: Secondary | ICD-10-CM

## 2023-09-29 MED ORDER — ACCU-CHEK GUIDE TEST VI STRP: ORAL_STRIP | 99 refills | Status: DC

## 2023-09-29 NOTE — Telephone Encounter (Unsigned)
 Copied from CRM [id number]. Topic: Clinical - Medication Refill >> Sep 29, 2023  3:23 PM Kevelyn M wrote: Medication: glucose blood (ACCU-CHEK GUIDE) test strip  Has the patient contacted their pharmacy? Yes (Agent: If no, request that the patient contact the pharmacy for the refill. If patient does not wish to contact the pharmacy document the reason why and proceed with request.) (Agent: If yes, when and what did the pharmacy advise?)  This is the patient's preferred pharmacy:  [id number] [place], [place] - [address] [place] [address] Phone: [phone] Fax: [phone]   Is this the correct pharmacy for this prescription? Yes If no, delete pharmacy and type the correct one.   Has the prescription been filled recently? No  Is the patient out of the medication? No but she only has a few left.  Has the patient been seen for an appointment in the last year OR does the patient have an upcoming appointment? Yes  Can we respond through MyChart? No  Agent: Please be advised that Rx refills may take up to 3 business days. We ask that you follow-up with your pharmacy.

## 2023-09-29 NOTE — Telephone Encounter (Signed)
 This request has been handled. No further action is required. Glucose test strips sent to the pharmacy.

## 2023-10-03 ENCOUNTER — Encounter: Payer: Self-pay | Admitting: Family Medicine

## 2023-10-03 DIAGNOSIS — E1169 Type 2 diabetes mellitus with other specified complication: Secondary | ICD-10-CM

## 2023-10-03 NOTE — Telephone Encounter (Signed)
 Sent to pharmacy 06/05, pharmacy confirmed receipt.  This encounter was created in error - please disregard.

## 2023-10-03 NOTE — Telephone Encounter (Signed)
 Copied from CRM [id number]. Topic: Clinical - Medication Refill >> Oct 03, 2023  3:04 PM Carrielelia G wrote: Medication: glucose blood (ACCU-CHEK GUIDE TEST) test strip  Has the patient contacted their pharmacy? Yes (Agent: If no, request that the patient contact the pharmacy for the refill. If patient does not wish to contact the pharmacy document the reason why and proceed with request.) (Agent: If yes, when and what did the pharmacy advise?)  This is the patient's preferred pharmacy:  [id number] - [place], [place] - [address] [place] [address] Phone: [phone] Fax: [phone]   Is this the correct pharmacy for this prescription? Yes If no, delete pharmacy and type the correct one.    Is the patient out of the medication? No  Has the patient been seen for an appointment in the last year OR does the patient have an upcoming appointment? Yes  Can we respond through MyChart? No  Agent: Please be advised that Rx refills may take up to 3 business days. We ask that you follow-up with your pharmacy.

## 2023-10-04 DIAGNOSIS — G43709 Chronic migraine without aura, not intractable, without status migrainosus: Secondary | ICD-10-CM | POA: Diagnosis not present

## 2023-10-04 DIAGNOSIS — M7918 Myalgia, other site: Secondary | ICD-10-CM | POA: Diagnosis not present

## 2023-10-05 ENCOUNTER — Ambulatory Visit (INDEPENDENT_AMBULATORY_CARE_PROVIDER_SITE_OTHER): Admitting: Family Medicine

## 2023-10-05 ENCOUNTER — Encounter: Payer: Self-pay | Admitting: Family Medicine

## 2023-10-05 VITALS — BP 134/71 | HR 89 | Ht 64.0 in | Wt 199.1 lb

## 2023-10-05 DIAGNOSIS — Z794 Long term (current) use of insulin: Secondary | ICD-10-CM | POA: Diagnosis not present

## 2023-10-05 DIAGNOSIS — E1169 Type 2 diabetes mellitus with other specified complication: Secondary | ICD-10-CM

## 2023-10-05 DIAGNOSIS — R1110 Vomiting, unspecified: Secondary | ICD-10-CM | POA: Diagnosis not present

## 2023-10-05 DIAGNOSIS — K449 Diaphragmatic hernia without obstruction or gangrene: Secondary | ICD-10-CM

## 2023-10-05 DIAGNOSIS — M25512 Pain in left shoulder: Secondary | ICD-10-CM

## 2023-10-05 DIAGNOSIS — R0989 Other specified symptoms and signs involving the circulatory and respiratory systems: Secondary | ICD-10-CM | POA: Diagnosis not present

## 2023-10-05 DIAGNOSIS — M25511 Pain in right shoulder: Secondary | ICD-10-CM

## 2023-10-05 LAB — POCT GLYCOSYLATED HEMOGLOBIN (HGB A1C): Hemoglobin A1C: 7.2 % — AB (ref 4.0–5.6)

## 2023-10-05 MED ORDER — CYCLOBENZAPRINE HCL 10 MG PO TABS: 10.0000 mg | ORAL_TABLET | Freq: Three times a day (TID) | ORAL | 0 refills | Status: AC | PRN

## 2023-10-05 MED ORDER — IPRATROPIUM BROMIDE 0.03 % NA SOLN: 2.0000 | Freq: Two times a day (BID) | NASAL | 12 refills | Status: AC

## 2023-10-05 NOTE — Progress Notes (Signed)
 Established Patient Office Visit  Subjective  Patient ID: Amanda Morris, female    DOB: 1951/01/24  Age: 73 y.o. MRN: [id number]  Chief Complaint  Patient presents with   Follow-up    HPI  She is here today for recent follow-up from emergency department on May 30 for headache for 1 week and nausea.  She has been having some headaches mostly frontal also some pain going down both upper arms.  She actually has an appointment tomorrow with neurology for further workup.  She was given Voltaren  and Flexeril  to take.  She has a history of lumbar fusion with residual and chronic pain.  Did fall about 2 weeks prior to the ED visit.  Head CT was negative for any mass, hemorrhage etc.  No sinus disease.  They also did a CT of the cervical spine and showed no fracture or acute dislocation.  We also did an x-ray of her left shoulder.  Shoulder x-ray showed some degeneration of the [facility] joint and superior positioning of the humeral head which could suggest a rotator cuff tear.  He still having a headache almost every other day she feels like it is mostly on the top of her head but then sometimes radiates down both sides of her neck she has also had some bilateral ear ringing that started since the headache started but no hearing loss.  She was given a muscle relaxer which she says does help she has been taking 1 in the morning but is almost out.  She has a follow-up tomorrow with her neurosurgeon in regards to her failed low back surgery evidently the screws have moved and she is discussing potential revision with him tomorrow.  She also reports that she has had a persistently runny nose with a little bit of cough and phlegm she has been using her nasal sprays she says occasionally the mucus is actually yellow or blood-tinged.  Head CT was negative for sinusitis.  She still having occasional vomiting after eating.  She has a history of severe GERD and postprandial vomiting.  She says sometimes she can  eat anything and then other times not too long after eating she will vomit but when she does it is usually not the actual food particles it is just more her stomach juices.  In regards to new onset headache she denies any recent changes to medications caffeine intake, supplements etc.  She says she has not been sleeping well the last couple of weeks though last night she finally slept well.  She did take a couple of Tylenol  this morning because she felt the headache was starting to come back on again.    ROS    Objective:     BP 134/71   Pulse 89   Ht 5' 4 (1.626 m)   Wt 199 lb 1.3 oz (90.3 kg)   LMP  (LMP Unknown)   SpO2 100%   BMI 34.17 kg/m    Physical Exam Vitals and nursing note reviewed.  Constitutional:      Appearance: Normal appearance.  HENT:     Head: Normocephalic and atraumatic.  Eyes:     Conjunctiva/sclera: Conjunctivae normal.  Cardiovascular:     Rate and Rhythm: Normal rate and regular rhythm.  Pulmonary:     Effort: Pulmonary effort is normal.     Breath sounds: Normal breath sounds.  Skin:    General: Skin is warm and dry.  Neurological:     Mental Status: She is alert.  Psychiatric:        Mood and Affect: Mood normal.      Results for orders placed or performed in visit on 10/05/23  POCT HgB A1C  Result Value Ref Range   Hemoglobin A1C 7.2 (A) 4.0 - 5.6 %   HbA1c POC (<> result, manual entry)     HbA1c, POC (prediabetic range)     HbA1c, POC (controlled diabetic range)        The ASCVD Risk score (Arnett DK, et al., 2019) failed to calculate for the following reasons:   The valid total cholesterol range is 130 to 320 mg/dL    Assessment & Plan:   Problem List Items Addressed This Visit       Respiratory   Hiatal hernia     Digestive   Postprandial vomiting - Primary     Endocrine   Type 2 diabetes mellitus with other specified complication (HCC)   A1c is up to 7.2 from previous of 6.0 back in the fall.  She is currently  on Jardiance  and glipizide .  Not as optimally controlled as I would like.  She did bring in her glucose log today.  Blood sugars tend to swing a lot.  She did better with the GLP-1 but unfortunately it caused more postprandial vomiting and nausea.      Relevant Orders   POCT HgB A1C (Completed)   Other Visit Diagnoses       Acute pain of both shoulders         Runny nose       Relevant Medications   ipratropium (ATROVENT) 0.03 % nasal spray      She has been having some postprandial vomiting again she does have a history of severe GERD and a hiatal hernia did encourage her to follow back up with GI if symptoms persist it seemed to be under a little bit better control but it seems to have ramped up again more recently.  Headache-unclear etiology he really cannot pinpoint any specific changes that would have triggered it certainly she has also had a lot of runny nose and drainage so allergies and sinus pressure could be a culprit.  CT was negative for acute sinusitis.  Recommend a trial of Atrovent nasal spray to help with the runny nose.  Did get some relief with the muscle relaxer so I am sending that over to the pharmacy for short-term.  Has a follow-up with neurosurgery tomorrow for hardware problem status post lumbar surgery.  She may end up having another surgical procedure this summer for revision.  Return in about 4 months (around 02/04/2024) for Diabetes follow-up.    Duaine German, MD

## 2023-10-05 NOTE — Assessment & Plan Note (Addendum)
 A1c is up to 7.2 from previous of 6.0 back in the fall.  She is currently on Jardiance  and glipizide .  Not as optimally controlled as I would like.  She did bring in her glucose log today.  Blood sugars tend to swing a lot.  She did better with the GLP-1 but unfortunately it caused more postprandial vomiting and nausea.

## 2023-10-05 NOTE — Patient Instructions (Addendum)
 Please schedule with Dr. Sandy Crumb for your shoulders.  For the runny nose and drainage were going to try ipratropium nasal spray you can continue your fluticasone /Flonase  as well.  This medication works a little bit differently to help dry up secretions so we can try that and see if that is helpful.  If you continue to have the vomiting after eating we may need to get you back in with the GI doctor.

## 2023-10-06 ENCOUNTER — Telehealth: Payer: Self-pay

## 2023-10-06 DIAGNOSIS — E1169 Type 2 diabetes mellitus with other specified complication: Secondary | ICD-10-CM

## 2023-10-06 DIAGNOSIS — M51372 Other intervertebral disc degeneration, lumbosacral region with discogenic back pain and lower extremity pain: Secondary | ICD-10-CM | POA: Diagnosis not present

## 2023-10-06 NOTE — Telephone Encounter (Signed)
 Copied from CRM [id number]. Topic: Clinical - Medication Question >> Oct 06, 2023 10:58 AM Shelby Dessert H wrote: Reason for CRM: Patient called and stated that the patients insurance told her to tell the provider to fill glipiZIDE  (GLUCOTROL ) 5 MG tablet to take more than twice a day due to her sugar still being high. Could you assist? Patients callback number is [phone].

## 2023-10-06 NOTE — Telephone Encounter (Signed)
 Please review CRM. Is the dose change appropriate? Please advise, thanks.

## 2023-10-07 ENCOUNTER — Ambulatory Visit (INDEPENDENT_AMBULATORY_CARE_PROVIDER_SITE_OTHER): Admitting: Sports Medicine

## 2023-10-07 DIAGNOSIS — G6289 Other specified polyneuropathies: Secondary | ICD-10-CM

## 2023-10-07 DIAGNOSIS — M5030 Other cervical disc degeneration, unspecified cervical region: Secondary | ICD-10-CM | POA: Diagnosis not present

## 2023-10-07 MED ORDER — PREDNISONE 50 MG PO TABS: ORAL_TABLET | ORAL | 0 refills | Status: DC

## 2023-10-07 MED ORDER — GLIPIZIDE 10 MG PO TABS: 10.0000 mg | ORAL_TABLET | Freq: Two times a day (BID) | ORAL | 1 refills | Status: DC

## 2023-10-07 MED ORDER — PREGABALIN 100 MG PO CAPS: 100.0000 mg | ORAL_CAPSULE | Freq: Two times a day (BID) | ORAL | 3 refills | Status: DC

## 2023-10-07 NOTE — Assessment & Plan Note (Signed)
 Very pleasant 73 year old female, chronic neck pain, she has seen Dr. Bevin Bucks, her neurosurgeon. Pain is going down the left arm. She has not had any treatment per her report. She does have a history of a lumbar fusion. No red flag symptoms such as progressive weakness, no trauma. X-rays and cervical spine CT were done at an outside facility. Adding 5 days of prednisone , we will increase her Lyrica  to 100 mg, adding formal PT. Return to see me in 6 weeks, MR for interventional planning if not better.

## 2023-10-07 NOTE — Progress Notes (Signed)
    Procedures performed today:    None.  Independent interpretation of notes and tests performed by another provider:   None.  Brief History, Exam, Impression, and Recommendations:    DDD (degenerative disc disease), cervical Very pleasant 73 year old female, chronic neck pain, she has seen Dr. Bevin Bucks, her neurosurgeon. Pain is going down the left arm. She has not had any treatment per her report. She does have a history of a lumbar fusion. No red flag symptoms such as progressive weakness, no trauma. X-rays and cervical spine CT were done at an outside facility. Adding 5 days of prednisone , we will increase her Lyrica  to 100 mg, adding formal PT. Return to see me in 6 weeks, MR for interventional planning if not better.    ____________________________________________ Joselyn Nicely. Sandy Crumb, M.D., ABFM., CAQSM., AME. Primary Care and Sports Medicine [facility]  Adjunct Professor of [facility] Medicine  [employer]  Restaurant manager, fast food

## 2023-10-07 NOTE — Telephone Encounter (Signed)
 Pleaes tell her I bumped her to 10mg  BID  Meds ordered this encounter  Medications   glipiZIDE  (GLUCOTROL ) 10 MG tablet    Sig: Take 1 tablet (10 mg total) by mouth 2 (two) times daily before a meal.    Dispense:  180 tablet    Refill:  1

## 2023-10-07 NOTE — Telephone Encounter (Signed)
 Contacted patient and advised her that her Glipizide  was changed to 10 MG twice daily with a meal and that the Rx was sent to the pharmacy.

## 2023-10-10 DIAGNOSIS — M47816 Spondylosis without myelopathy or radiculopathy, lumbar region: Secondary | ICD-10-CM | POA: Diagnosis not present

## 2023-10-10 NOTE — Therapy (Signed)
 OUTPATIENT PHYSICAL THERAPY CERVICAL EVALUATION   Patient Name: Amanda Morris MRN: [id number] DOB:[birth year], 73 y.o., female Today's Date: 10/11/2023  END OF SESSION:  PT End of Session - 10/11/23 1105     Visit Number 1    Date for PT Re-Evaluation 12/06/23    Authorization Type UHC MCR    Progress Note Due on Visit 10    PT Start Time 1111    PT Stop Time 1148    PT Time Calculation (min) 37 min    Activity Tolerance Patient tolerated treatment well    Behavior During Therapy WFL for tasks assessed/performed          Past Medical History:  Diagnosis Date   Allergy    Diabetes mellitus without complication (HCC)    type 2   GERD (gastroesophageal reflux disease)    Headache    History of hiatal hernia    Hypercholesterolemia    Numbness    left, outer thigh   Palpitations    in the past, no current issues per patient   Postlaminectomy syndrome    Past Surgical History:  Procedure Laterality Date   ABDOMINAL HYSTERECTOMY  1982   BACK SURGERY     x 2    BREAST REDUCTION SURGERY Bilateral 03/17/2016   Procedure: MAMMARY REDUCTION  (BREAST);  Surgeon: Thornell Flirt, DO;  Location: [facility];  Service: Plastics;  Laterality: Bilateral;   BREAST SURGERY     COLONOSCOPY     LUMBAR WOUND DEBRIDEMENT N/A 05/22/2020   Procedure: Incision and drainage of lumbar wound;  Surgeon: Audie Bleacher, MD;  Location: [facility] OR;  Service: Neurosurgery;  Laterality: N/A;   SHOULDER SURGERY  06/2008, 09/2010   Right, by Dr. Lindell Revel, then Dr. Maryan Smalling   SPINAL CORD STIMULATOR INSERTION N/A 12/02/2017   Procedure: LUMBAR SPINAL CORD STIMULATOR INSERTION;  Surgeon: Audie Bleacher, MD;  Location: [facility] OR;  Service: Neurosurgery;  Laterality: N/A;   SPINAL CORD STIMULATOR TRIAL N/A 11/25/2017   Procedure: INSERTION LUMBAR SPINAL CORD STIMULATOR TRIAL  ;  Surgeon: Audie Bleacher, MD;  Location: [facility] OR;  Service: Neurosurgery;  Laterality: N/A;   INSERTION LUMBAR  SPINAL CORD STIMULATOR TRIAL     UPPER GI ENDOSCOPY     Patient Active Problem List   Diagnosis Date Noted   DDD (degenerative disc disease), cervical 10/07/2023   IBS (irritable bowel syndrome) 04/28/2023   Hiatal hernia 04/28/2023   Osteopenia after menopause 02/18/2023   Frequent falls 02/10/2023   Screening for osteoporosis 02/08/2023   Lung nodule 12/24/2022   Atherosclerosis of aorta (HCC) 11/02/2022   Right flank pain 08/17/2022   Left hip pain 02/23/2021   Low back pain 02/23/2021   Gait abnormality 02/23/2021   Bilateral lower extremity edema 01/22/2021   Elevated CK 10/12/2020   Spinal stenosis 10/09/2020   Lumbar adjacent segment disease with spondylolisthesis 10/09/2020   Spinal stenosis, lumbar region with neurogenic claudication 04/16/2020   Peripheral neuropathy 11/28/2019   Paresthesia and pain of both upper extremities 11/12/2019   Chronic midline low back pain with bilateral sciatica 10/30/2019   Meralgia paresthetica, left 09/12/2019   Eddie Good Quervain's disease (tenosynovitis) 02/06/2019   Acute bilateral low back pain without sciatica 12/01/2018   Failed back syndrome of lumbar spine 11/25/2017   Inflammation of sacroiliac joint (HCC) 06/07/2017   Displacement of lumbar intervertebral disc 11/22/2016   Fatty liver 09/27/2016   Status post bilateral breast reduction 03/23/2016   Hypertrophy of breast 08/12/2015  Spondylolisthesis of lumbar region 06/13/2015   History of lumbar fusion 04/07/2015   Radiculopathy, lumbar region 04/07/2015   Essential hypertension 02/14/2015   Pelvic pain 12/02/2014   Postprandial vomiting 02/04/2014   Obesity (BMI 30-39.9) 11/01/2013   Carotid artery stenosis 06/07/2013   Hereditary and idiopathic peripheral neuropathy 06/07/2013   History of migraine 05/04/2013   Type 2 diabetes mellitus with other specified complication (HCC) 05/05/2011   SVT (supraventricular tachycardia) (HCC) 11/03/2010   GERD (gastroesophageal reflux  disease) 10/29/2010   Chronic low back pain 10/29/2010   Hyperlipidemia associated with type 2 diabetes mellitus (HCC) 10/29/2010    PCP: Cydney Draft, MD   REFERRING PROVIDER: Gean Keels,*   REFERRING DIAG: M50.30 (ICD-10-CM) - DDD (degenerative disc disease), cervical   THERAPY DIAG:  Cervicalgia  Acute pain of both shoulders  Abnormal posture  Muscle weakness (generalized)  Rationale for Evaluation and Treatment: Rehabilitation  ONSET DATE: 09/23/23  SUBJECTIVE:                                                                                                                                                                                                         SUBJECTIVE STATEMENT: Patient reporting her neck pain has moved into bilateral arms beginning months ago. Started in the left side and it was hard for her to use her left shoulder to wash her hair. Can't do in the tub, must stand at sink.  N/T started on the Left, but is now into the R UE as well. HA have improved since ED. Supposed to have surgery in low back in 3 weeks. She has spurs in her neck. Hand dominance: Right  PERTINENT HISTORY:  DM, DDD cervical  PAIN:  Are you having pain? Yes: NPRS scale: see below Pain location: B neck and shoulder  down arm to wrist with N/T, 5-6/10; Right 2-3/10 Pain description: agitating Aggravating factors: lifting, cleaning, bathing Relieving factors: nothing  PRECAUTIONS: None  RED FLAGS: None   FALLS:  Has patient fallen in last 6 months? Yes. Number of falls fell trying to bring her groceries on 08/05/23. She hit her head on the right side.   LIVING ENVIRONMENT: Lives with: lives alone Lives in: House/apartment Stairs: No Has following equipment at home: Single point cane and Environmental consultant - 2 wheeled  OCCUPATION: retired  PLOF: Independent with household mobility with device  PATIENT GOALS: Get my arms and shoulders to feel better and be able to  do things like I used to do  NEXT MD VISIT: 11/18/23  OBJECTIVE:  Note: Objective  measures were completed at Evaluation unless otherwise noted.  DIAGNOSTIC FINDINGS:  XR L shoulder 09/23/23 IMPRESSION:  1. No acute fracture or dislocation. Narrowing of the left hemicolon interval suggesting rotator cuff tear.  CT negative  PATIENT SURVEYS:  NDI:  NECK DISABILITY INDEX  Date: 10/11/23 Score  Pain intensity 3 = The pain is fairly severe at the moment  2. Personal care (washing, dressing, etc.) 3 =  I need some help but can manage most of my personal care  3. Lifting 4 =  I can only lift very light weights  4. Reading 2 =  I can read as much as I want with moderate pain in my neck  5. Headaches 2 =  I have moderate headaches, which come infrequently  6. Concentration 2 = I have a fair degree of difficulty in concentrating when I want to  7. Work 3 =  I cannot do my usual work  8. Driving 3 = I can't drive my car as long as I want because of moderate pain in my neck  9. Sleeping 3 =  My sleep is moderately disturbed (2-3 hrs sleepless)  10. Recreation 4 =  I can hardly do any recreation activities because of pain in my neck  Total 29/50   Minimum Detectable Change (90% confidence): 5 points or 10% points  COGNITION: Overall cognitive status: Within functional limits for tasks assessed   POSTURE: rounded shoulders, forward head, increased thoracic kyphosis, and flexed trunk   PALPATION: NT   CERVICAL ROM: WNL except below; no pain reported  Active ROM A/PROM (deg) eval  Flexion   Extension   Right lateral flexion   Left lateral flexion   Right rotation 50  Left rotation 53   (Blank rows = not tested)  UPPER EXTREMITY ROM:  Active ROM Right eval Left eval  Shoulder flexion 95 sitting/150 supine 75 sitting/140 supine  Shoulder extension    Shoulder abduction 85 65  Shoulder adduction    Shoulder extension    Shoulder internal rotation L2 S1  Shoulder external  rotation Behind head barely Unable to reach behind head  Elbow flexion    Elbow extension    Wrist flexion    Wrist extension    Wrist ulnar deviation    Wrist radial deviation    Wrist pronation    Wrist supination     (Blank rows = not tested)  UPPER EXTREMITY MMT: left shoulder flex/ABD 3+ with pain, R 4-, B IR/ER 4-  MMT Right eval Left eval  Shoulder flexion    Shoulder extension    Shoulder abduction    Shoulder adduction    Shoulder extension    Shoulder internal rotation    Shoulder external rotation    Middle trapezius    Lower trapezius    Elbow flexion    Elbow extension    Wrist flexion    Wrist extension    Wrist ulnar deviation    Wrist radial deviation    Wrist pronation    Wrist supination    Grip strength     (Blank rows = not tested)  CERVICAL SPECIAL TESTS:  Spurling's test: Negative  TREATMENT DATE:  10/11/23 See pt ed and HEP    PATIENT EDUCATION:  Education details: PT eval findings, anticipated POC, initial HEP, postural awareness, and discussion of neck brace and likely D/C of it.   Person educated: Patient Education method: Explanation, Demonstration, Verbal cues, and Handouts Education comprehension: verbalized understanding and returned demonstration  HOME EXERCISE PROGRAM: Access Code: [email or web address] URL: [email or web address] Date: 10/11/2023 Prepared by: Concha Deed  Exercises - Supine Shoulder Flexion Extension AAROM with Dowel  - 2-3 x daily - 7 x weekly - 1-3 sets - 10 reps - Seated Scapular Retraction  - 1 x daily - 7 x weekly - 1-3 sets - 10 reps - 2-3 sec hold  ASSESSMENT:  CLINICAL IMPRESSION: Evaluation was shortended due to late arrival. Patient is a 73 y.o. female who was seen today for physical therapy evaluation and treatment for B neck and shoulder pain. She reports N/T bilaterally as  well. She has significant deficits in B shoulder ROM and strength as well as posture limiting her ability to perform [place] ADLs and lift items She presents with soft neck brace today which her neuro doc recommended she wear for 3 weeks. She reports no change in sx secondary to this as of today. Paitent will benefit from skilled PT to address these deficits.  Patient reports one fall in the past six months when trying to lift and carry some water. She uses a SPC for balance. She also reports she is to have back surgery in the next month although this has not been scheduled yet.  OBJECTIVE IMPAIRMENTS: decreased activity tolerance, decreased balance, decreased ROM, decreased strength, increased muscle spasms, impaired flexibility, postural dysfunction, and pain.   ACTIVITY LIMITATIONS: carrying, lifting, sleeping, bathing, toileting, dressing, reach over head, and hygiene/grooming  PARTICIPATION LIMITATIONS: meal prep, cleaning, and laundry  PERSONAL FACTORS: Age, Fitness, Past/current experiences, and 1-2 comorbidities: LBP and DM are also affecting patient's functional outcome.   REHAB POTENTIAL: Good  CLINICAL DECISION MAKING: Evolving/moderate complexity  EVALUATION COMPLEXITY: Moderate   GOALS: Goals reviewed with patient? Yes  SHORT TERM GOALS: Target date: 11/08/2023   Patient will be independent with initial HEP.  Baseline:  Goal status: INITIAL 2.  Decreased neck and shoulder pain by 30%  Baseline:  Goal status: INITIAL 3.  Patient will demonstrate decreased fall risk by scoring < 25 sec on TUG Baseline: TBD Goal status: INITIAL    LONG TERM GOALS: Target date: 12/06/2023   Patient will be independent with advanced/ongoing HEP to improve outcomes and carryover.  Baseline:  Goal status: INITIAL  2.  Patient will report 75% improvement in neck and shoulder pain to improve QOL.  Baseline:  Goal status: INITIAL  3.  Patient will demonstrate functional B shoulder ROM to wash  her hair in the bath.  Baseline:  Goal status:INITIAL  4. Patient will report 10 point decrease on NDI to demonstrate improved functional ability.  Baseline: 29/50 Goal status:INITIAL  5.  Improved B shoulder strength to 4+/5 or better to improve tolerance to [facility] activities.  Baseline:  Goal status: INITIAL  6.  Patient will report decreased N/T in B UE by 50% or more.    Baseline:  Goal status: INITIAL    PLAN:  PT FREQUENCY: 2x/week  PT DURATION: 8 weeks  PLANNED INTERVENTIONS: 97164- PT Re-evaluation, 97110-Therapeutic exercises, 97530- Therapeutic activity, [id number]- Neuromuscular re-education, 97535- Self Care, 16109- Manual therapy, [id number]- Electrical stimulation (unattended), [id number]- Ultrasound, 09811- Traction (mechanical), [id number]- Ionotophoresis 4mg /ml Dexamethasone , 91478 (1-2 muscles), 20561 (3+ muscles)- Dry Needling,  Patient/Family education, Balance training, Taping, Joint mobilization, Spinal mobilization, Cryotherapy, and Moist heat  PLAN FOR NEXT SESSION: Perform TUG for STG; work on postural strength, shoulder ROM and strength. D/C soft collar, STM to neck prn    Jinx Mourning, PT 10/11/23 2:35 PM

## 2023-10-11 ENCOUNTER — Encounter: Payer: Self-pay | Admitting: Physical Therapy

## 2023-10-11 ENCOUNTER — Other Ambulatory Visit: Payer: Self-pay

## 2023-10-11 ENCOUNTER — Ambulatory Visit: Attending: Sports Medicine | Admitting: Physical Therapy

## 2023-10-11 DIAGNOSIS — M25512 Pain in left shoulder: Secondary | ICD-10-CM | POA: Diagnosis present

## 2023-10-11 DIAGNOSIS — M542 Cervicalgia: Secondary | ICD-10-CM | POA: Insufficient documentation

## 2023-10-11 DIAGNOSIS — M5030 Other cervical disc degeneration, unspecified cervical region: Secondary | ICD-10-CM | POA: Diagnosis not present

## 2023-10-11 DIAGNOSIS — R293 Abnormal posture: Secondary | ICD-10-CM | POA: Diagnosis present

## 2023-10-11 DIAGNOSIS — M6281 Muscle weakness (generalized): Secondary | ICD-10-CM | POA: Diagnosis present

## 2023-10-11 DIAGNOSIS — M25511 Pain in right shoulder: Secondary | ICD-10-CM | POA: Diagnosis present

## 2023-10-12 ENCOUNTER — Other Ambulatory Visit: Payer: Self-pay | Admitting: Family Medicine

## 2023-10-12 DIAGNOSIS — M898X1 Other specified disorders of bone, shoulder: Secondary | ICD-10-CM

## 2023-10-12 DIAGNOSIS — R079 Chest pain, unspecified: Secondary | ICD-10-CM

## 2023-10-24 ENCOUNTER — Ambulatory Visit

## 2023-10-24 DIAGNOSIS — M5030 Other cervical disc degeneration, unspecified cervical region: Secondary | ICD-10-CM | POA: Diagnosis not present

## 2023-10-24 DIAGNOSIS — R293 Abnormal posture: Secondary | ICD-10-CM

## 2023-10-24 DIAGNOSIS — M542 Cervicalgia: Secondary | ICD-10-CM | POA: Diagnosis not present

## 2023-10-24 DIAGNOSIS — M25511 Pain in right shoulder: Secondary | ICD-10-CM

## 2023-10-24 DIAGNOSIS — M25512 Pain in left shoulder: Secondary | ICD-10-CM | POA: Diagnosis not present

## 2023-10-24 DIAGNOSIS — M6281 Muscle weakness (generalized): Secondary | ICD-10-CM

## 2023-10-24 NOTE — Therapy (Signed)
 OUTPATIENT PHYSICAL THERAPY CERVICAL TREATMENT   Patient Name: Amanda Morris MRN: [id number] DOB:[birth year], 73 y.o., female Today's Date: 10/24/2023  END OF SESSION:  PT End of Session - 10/24/23 0757     Visit Number 2    Date for PT Re-Evaluation 12/06/23    Authorization Type [employer] MCR    PT Start Time 0800    PT Stop Time 0842    PT Time Calculation (min) 42 min    Activity Tolerance Patient tolerated treatment well    Behavior During Therapy WFL for tasks assessed/performed         Past Medical History:  Diagnosis Date   Allergy    Diabetes mellitus without complication (HCC)    type 2   GERD (gastroesophageal reflux disease)    Headache    History of hiatal hernia    Hypercholesterolemia    Numbness    left, outer thigh   Palpitations    in the past, no current issues per patient   Postlaminectomy syndrome    Past Surgical History:  Procedure Laterality Date   ABDOMINAL HYSTERECTOMY  1982   BACK SURGERY     x 2    BREAST REDUCTION SURGERY Bilateral 03/17/2016   Procedure: MAMMARY REDUCTION  (BREAST);  Surgeon: Estefana GORMAN Fritter, DO;  Location: [facility];  Service: Plastics;  Laterality: Bilateral;   BREAST SURGERY     COLONOSCOPY     LUMBAR WOUND DEBRIDEMENT N/A 05/22/2020   Procedure: Incision and drainage of lumbar wound;  Surgeon: Gillie Duncans, MD;  Location: [facility] OR;  Service: Neurosurgery;  Laterality: N/A;   SHOULDER SURGERY  06/2008, 09/2010   Right, by Dr. Lorence, then Dr. Wilbern   SPINAL CORD STIMULATOR INSERTION N/A 12/02/2017   Procedure: LUMBAR SPINAL CORD STIMULATOR INSERTION;  Surgeon: Gillie Duncans, MD;  Location: [facility] OR;  Service: Neurosurgery;  Laterality: N/A;   SPINAL CORD STIMULATOR TRIAL N/A 11/25/2017   Procedure: INSERTION LUMBAR SPINAL CORD STIMULATOR TRIAL  ;  Surgeon: Gillie Duncans, MD;  Location: [facility] OR;  Service: Neurosurgery;  Laterality: N/A;   INSERTION LUMBAR SPINAL CORD STIMULATOR TRIAL     UPPER  GI ENDOSCOPY     Patient Active Problem List   Diagnosis Date Noted   DDD (degenerative disc disease), cervical 10/07/2023   IBS (irritable bowel syndrome) 04/28/2023   Hiatal hernia 04/28/2023   Osteopenia after menopause 02/18/2023   Frequent falls 02/10/2023   Screening for osteoporosis 02/08/2023   Lung nodule 12/24/2022   Atherosclerosis of aorta (HCC) 11/02/2022   Right flank pain 08/17/2022   Left hip pain 02/23/2021   Low back pain 02/23/2021   Gait abnormality 02/23/2021   Bilateral lower extremity edema 01/22/2021   Elevated CK 10/12/2020   Spinal stenosis 10/09/2020   Lumbar adjacent segment disease with spondylolisthesis 10/09/2020   Spinal stenosis, lumbar region with neurogenic claudication 04/16/2020   Peripheral neuropathy 11/28/2019   Paresthesia and pain of both upper extremities 11/12/2019   Chronic midline low back pain with bilateral sciatica 10/30/2019   Meralgia paresthetica, left 09/12/2019   Everitt Quervain's disease (tenosynovitis) 02/06/2019   Acute bilateral low back pain without sciatica 12/01/2018   Failed back syndrome of lumbar spine 11/25/2017   Inflammation of sacroiliac joint (HCC) 06/07/2017   Displacement of lumbar intervertebral disc 11/22/2016   Fatty liver 09/27/2016   Status post bilateral breast reduction 03/23/2016   Hypertrophy of breast 08/12/2015   Spondylolisthesis of lumbar region 06/13/2015   History of lumbar  fusion 04/07/2015   Radiculopathy, lumbar region 04/07/2015   Essential hypertension 02/14/2015   Pelvic pain 12/02/2014   Postprandial vomiting 02/04/2014   Obesity (BMI 30-39.9) 11/01/2013   Carotid artery stenosis 06/07/2013   Hereditary and idiopathic peripheral neuropathy 06/07/2013   History of migraine 05/04/2013   Type 2 diabetes mellitus with other specified complication (HCC) 05/05/2011   SVT (supraventricular tachycardia) (HCC) 11/03/2010   GERD (gastroesophageal reflux disease) 10/29/2010   Chronic low back  pain 10/29/2010   Hyperlipidemia associated with type 2 diabetes mellitus (HCC) 10/29/2010    PCP: Alvan Dorothyann BIRCH, MD   REFERRING PROVIDER: Curtis Debby PARAS,*   REFERRING DIAG: M50.30 (ICD-10-CM) - DDD (degenerative disc disease), cervical   THERAPY DIAG:  Cervicalgia  Acute pain of both shoulders  Abnormal posture  Muscle weakness (generalized)  Rationale for Evaluation and Treatment: Rehabilitation  ONSET DATE: 09/23/23  SUBJECTIVE:                                                                                                                                                                                                         SUBJECTIVE STATEMENT: Patient arrived without soft neck brace; states she is still wearing it. Patient reports no changes in symptoms since eval.  EVAL: Patient reporting her neck pain has moved into bilateral arms beginning months ago. Started in the left side and it was hard for her to use her left shoulder to wash her hair. Can't do in the tub, must stand at sink.  N/T started on the Left, but is now into the R UE as well. HA have improved since ED. Supposed to have surgery in low back in 3 weeks. She has spurs in her neck. Hand dominance: Right  PERTINENT HISTORY:  DM, DDD cervical  PAIN:  Are you having pain? Yes: NPRS scale: see below Pain location: B neck and shoulder  down arm to wrist with N/T, 5-6/10; Right 2-3/10 Pain description: agitating Aggravating factors: lifting, cleaning, bathing Relieving factors: nothing  PRECAUTIONS: None  RED FLAGS: None   FALLS:  Has patient fallen in last 6 months? Yes. Number of falls fell trying to bring her groceries on 08/05/23. She hit her head on the right side.   LIVING ENVIRONMENT: Lives with: lives alone Lives in: House/apartment Stairs: No Has following equipment at home: Single point cane and Environmental consultant - 2 wheeled  OCCUPATION: retired  PLOF: Independent with household  mobility with device  PATIENT GOALS: Get my arms and shoulders to feel better and be able to do things like I used  to do  NEXT MD VISIT: 11/18/23  OBJECTIVE:  Note: Objective measures were completed at Evaluation unless otherwise noted.  DIAGNOSTIC FINDINGS:  XR L shoulder 09/23/23 IMPRESSION:  1. No acute fracture or dislocation. Narrowing of the left hemicolon interval suggesting rotator cuff tear.  CT negative  PATIENT SURVEYS:  NDI:  NECK DISABILITY INDEX  Date: 10/11/23 Score  Pain intensity 3 = The pain is fairly severe at the moment  2. Personal care (washing, dressing, etc.) 3 =  I need some help but can manage most of my personal care  3. Lifting 4 =  I can only lift very light weights  4. Reading 2 =  I can read as much as I want with moderate pain in my neck  5. Headaches 2 =  I have moderate headaches, which come infrequently  6. Concentration 2 = I have a fair degree of difficulty in concentrating when I want to  7. Work 3 =  I cannot do my usual work  8. Driving 3 = I can't drive my car as long as I want because of moderate pain in my neck  9. Sleeping 3 =  My sleep is moderately disturbed (2-3 hrs sleepless)  10. Recreation 4 =  I can hardly do any recreation activities because of pain in my neck  Total 29/50   Minimum Detectable Change (90% confidence): 5 points or 10% points  COGNITION: Overall cognitive status: Within functional limits for tasks assessed   POSTURE: rounded shoulders, forward head, increased thoracic kyphosis, and flexed trunk   PALPATION: NT   CERVICAL ROM: WNL except below; no pain reported  Active ROM A/PROM (deg) eval  Flexion   Extension   Right lateral flexion   Left lateral flexion   Right rotation 50  Left rotation 53   (Blank rows = not tested)  UPPER EXTREMITY ROM:  Active ROM Right eval Left eval  Shoulder flexion 95 sitting/150 supine 75 sitting/140 supine  Shoulder extension    Shoulder abduction 85 65  Shoulder  adduction    Shoulder extension    Shoulder internal rotation L2 S1  Shoulder external rotation Behind head barely Unable to reach behind head  Elbow flexion    Elbow extension    Wrist flexion    Wrist extension    Wrist ulnar deviation    Wrist radial deviation    Wrist pronation    Wrist supination     (Blank rows = not tested)  UPPER EXTREMITY MMT: left shoulder flex/ABD 3+ with pain, R 4-, B IR/ER 4-  MMT Right eval Left eval  Shoulder flexion    Shoulder extension    Shoulder abduction    Shoulder adduction    Shoulder extension    Shoulder internal rotation    Shoulder external rotation    Middle trapezius    Lower trapezius    Elbow flexion    Elbow extension    Wrist flexion    Wrist extension    Wrist ulnar deviation    Wrist radial deviation    Wrist pronation    Wrist supination    Grip strength     (Blank rows = not tested)  CERVICAL SPECIAL TESTS:  Spurling's test: Negative   [facility] Adult PT Treatment:  DATE: 10/24/2023 Therapeutic Exercise: Pulleys shoulder flexion in scaption range x 2 min; horiz ER 10x5 Supine: Shoulder flexion + 1#dowel x 10 AAROM shoulder abd with dowel Chest lift + scap squeeze Chest stretch with hands behind head Doorway pec stretch 2x30 Neuromuscular re-ed: Standing scap squeeze + chest lift with back against noodle on wall Supine cervical retraction + towel behind head 10x5 Resisted shoulder extension + RTB Serratus activation at wall Therapeutic Activity: TUG Resisted rows + GTB    TREATMENT DATE:                                                                                                                               10/11/23 See pt ed and HEP    PATIENT EDUCATION:  Education details: Updated HEP   Person educated: Patient Education method: Explanation, Demonstration, Verbal cues, and Handouts Education comprehension: verbalized understanding and returned  demonstration  HOME EXERCISE PROGRAM: Access Code: [email or web address] URL: [email or web address] Date: 10/24/2023 Prepared by: Lamarr Price  Exercises - Supine Shoulder Flexion Extension AAROM with Dowel  - 2-3 x daily - 7 x weekly - 1-3 sets - 10 reps - Seated Scapular Retraction  - 1 x daily - 7 x weekly - 1-3 sets - 10 reps - 2-3 sec hold - Supine Chest Stretch with Elbows Bent  - 1 x daily - 7 x weekly - 3 sets - 10 reps - Supine Cervical Retraction with Towel  - 1 x daily - 7 x weekly - 3 sets - 10 reps - Doorway Pec Stretch at 60 Elevation  - 2 x daily - 7 x weekly - 1 sets - 3 reps - 30 sec hold - Serratus Activation at Wall  - 1 x daily - 7 x weekly - 3 sets - 10 reps - Standing Shoulder Row with Anchored Resistance  - 1 x daily - 7 x weekly - 3 sets - 10 reps - 3 sec hold   ASSESSMENT:  CLINICAL IMPRESSION: Postural exercises continued as tolerated. Frequent tactile cues provided to maintain scapula retraction and chest lift; patient had difficulty maintaining upright posture in standing. Patient tolerated overhead arm raises with minimal discomfort.   EVAL: Evaluation was shortended due to late arrival. Patient is a 73 y.o. female who was seen today for physical therapy evaluation and treatment for B neck and shoulder pain. She reports N/T bilaterally as well. She has significant deficits in B shoulder ROM and strength as well as posture limiting her ability to perform [place] ADLs and lift items She presents with soft neck brace today which her neuro doc recommended she wear for 3 weeks. She reports no change in sx secondary to this as of today. Paitent will benefit from skilled PT to address these deficits.  Patient reports one fall in the past six months when trying to lift and carry some water. She uses a SPC for balance. She also reports she is to have back surgery in  the next month although this has not been scheduled yet.  OBJECTIVE IMPAIRMENTS: decreased activity tolerance,  decreased balance, decreased ROM, decreased strength, increased muscle spasms, impaired flexibility, postural dysfunction, and pain.   ACTIVITY LIMITATIONS: carrying, lifting, sleeping, bathing, toileting, dressing, reach over head, and hygiene/grooming  PARTICIPATION LIMITATIONS: meal prep, cleaning, and laundry  PERSONAL FACTORS: Age, Fitness, Past/current experiences, and 1-2 comorbidities: LBP and DM are also affecting patient's functional outcome.   REHAB POTENTIAL: Good  CLINICAL DECISION MAKING: Evolving/moderate complexity  EVALUATION COMPLEXITY: Moderate   GOALS: Goals reviewed with patient? Yes  SHORT TERM GOALS: Target date: 11/08/2023   Patient will be independent with initial HEP.  Baseline:  Goal status: INITIAL 2.  Decreased neck and shoulder pain by 30%  Baseline:  Goal status: INITIAL 3.  Patient will demonstrate decreased fall risk by scoring < 25 sec on TUG Baseline: TBD 10/24/23: 29 sec with SPC Goal status: INITIAL    LONG TERM GOALS: Target date: 12/06/2023   Patient will be independent with advanced/ongoing HEP to improve outcomes and carryover.  Baseline:  Goal status: INITIAL  2.  Patient will report 75% improvement in neck and shoulder pain to improve QOL.  Baseline:  Goal status: INITIAL  3.  Patient will demonstrate functional B shoulder ROM to wash her hair in the bath.  Baseline:  Goal status:INITIAL  4. Patient will report 10 point decrease on NDI to demonstrate improved functional ability.  Baseline: 29/50 Goal status:INITIAL  5.  Improved B shoulder strength to 4+/5 or better to improve tolerance to [facility] activities.  Baseline:  Goal status: INITIAL  6.  Patient will report decreased N/T in B UE by 50% or more.    Baseline:  Goal status: INITIAL    PLAN:  PT FREQUENCY: 2x/week  PT DURATION: 8 weeks  PLANNED INTERVENTIONS: 97164- PT Re-evaluation, 97110-Therapeutic exercises, 97530- Therapeutic activity, 97112- Neuromuscular  re-education, 97535- Self Care, 02859- Manual therapy, [id number]- Electrical stimulation (unattended), [id number]- Ultrasound, 02987- Traction (mechanical), [id number]- Ionotophoresis 4mg /ml Dexamethasone , 79439 (1-2 muscles), 20561 (3+ muscles)- Dry Needling, Patient/Family education, Balance training, Taping, Joint mobilization, Spinal mobilization, Cryotherapy, and Moist heat  PLAN FOR NEXT SESSION: work on postural strength, shoulder ROM and strength. D/C soft collar, STM to neck prn    Lamarr Price, PTA 10/24/23 8:43 AM

## 2023-10-26 ENCOUNTER — Ambulatory Visit: Attending: Sports Medicine

## 2023-10-26 DIAGNOSIS — R293 Abnormal posture: Secondary | ICD-10-CM | POA: Insufficient documentation

## 2023-10-26 DIAGNOSIS — M25512 Pain in left shoulder: Secondary | ICD-10-CM | POA: Diagnosis not present

## 2023-10-26 DIAGNOSIS — M25511 Pain in right shoulder: Secondary | ICD-10-CM | POA: Insufficient documentation

## 2023-10-26 DIAGNOSIS — M542 Cervicalgia: Secondary | ICD-10-CM | POA: Diagnosis not present

## 2023-10-26 DIAGNOSIS — R2681 Unsteadiness on feet: Secondary | ICD-10-CM | POA: Insufficient documentation

## 2023-10-26 DIAGNOSIS — M5459 Other low back pain: Secondary | ICD-10-CM | POA: Diagnosis not present

## 2023-10-26 DIAGNOSIS — M6281 Muscle weakness (generalized): Secondary | ICD-10-CM | POA: Diagnosis not present

## 2023-10-26 DIAGNOSIS — R296 Repeated falls: Secondary | ICD-10-CM | POA: Insufficient documentation

## 2023-10-26 NOTE — Therapy (Signed)
 OUTPATIENT PHYSICAL THERAPY CERVICAL TREATMENT   Patient Name: Amanda Morris MRN: [id number] DOB:[birth year], 73 y.o., female Today's Date: 10/26/2023  END OF SESSION:  PT End of Session - 10/26/23 0849     Visit Number 3    Date for PT Re-Evaluation 12/06/23    Authorization Type [employer] MCR    Progress Note Due on Visit 10    PT Start Time 0850    PT Stop Time 0933    PT Time Calculation (min) 43 min    Activity Tolerance Patient tolerated treatment well    Behavior During Therapy WFL for tasks assessed/performed         Past Medical History:  Diagnosis Date   Allergy    Diabetes mellitus without complication (HCC)    type 2   GERD (gastroesophageal reflux disease)    Headache    History of hiatal hernia    Hypercholesterolemia    Numbness    left, outer thigh   Palpitations    in the past, no current issues per patient   Postlaminectomy syndrome    Past Surgical History:  Procedure Laterality Date   ABDOMINAL HYSTERECTOMY  1982   BACK SURGERY     x 2    BREAST REDUCTION SURGERY Bilateral 03/17/2016   Procedure: MAMMARY REDUCTION  (BREAST);  Surgeon: Estefana GORMAN Fritter, DO;  Location: [facility];  Service: Plastics;  Laterality: Bilateral;   BREAST SURGERY     COLONOSCOPY     LUMBAR WOUND DEBRIDEMENT N/A 05/22/2020   Procedure: Incision and drainage of lumbar wound;  Surgeon: Gillie Duncans, MD;  Location: [facility] OR;  Service: Neurosurgery;  Laterality: N/A;   SHOULDER SURGERY  06/2008, 09/2010   Right, by Dr. Lorence, then Dr. Wilbern   SPINAL CORD STIMULATOR INSERTION N/A 12/02/2017   Procedure: LUMBAR SPINAL CORD STIMULATOR INSERTION;  Surgeon: Gillie Duncans, MD;  Location: [facility] OR;  Service: Neurosurgery;  Laterality: N/A;   SPINAL CORD STIMULATOR TRIAL N/A 11/25/2017   Procedure: INSERTION LUMBAR SPINAL CORD STIMULATOR TRIAL  ;  Surgeon: Gillie Duncans, MD;  Location: [facility] OR;  Service: Neurosurgery;  Laterality: N/A;   INSERTION LUMBAR SPINAL  CORD STIMULATOR TRIAL     UPPER GI ENDOSCOPY     Patient Active Problem List   Diagnosis Date Noted   DDD (degenerative disc disease), cervical 10/07/2023   IBS (irritable bowel syndrome) 04/28/2023   Hiatal hernia 04/28/2023   Osteopenia after menopause 02/18/2023   Frequent falls 02/10/2023   Screening for osteoporosis 02/08/2023   Lung nodule 12/24/2022   Atherosclerosis of aorta (HCC) 11/02/2022   Right flank pain 08/17/2022   Left hip pain 02/23/2021   Low back pain 02/23/2021   Gait abnormality 02/23/2021   Bilateral lower extremity edema 01/22/2021   Elevated CK 10/12/2020   Spinal stenosis 10/09/2020   Lumbar adjacent segment disease with spondylolisthesis 10/09/2020   Spinal stenosis, lumbar region with neurogenic claudication 04/16/2020   Peripheral neuropathy 11/28/2019   Paresthesia and pain of both upper extremities 11/12/2019   Chronic midline low back pain with bilateral sciatica 10/30/2019   Meralgia paresthetica, left 09/12/2019   Everitt Quervain's disease (tenosynovitis) 02/06/2019   Acute bilateral low back pain without sciatica 12/01/2018   Failed back syndrome of lumbar spine 11/25/2017   Inflammation of sacroiliac joint (HCC) 06/07/2017   Displacement of lumbar intervertebral disc 11/22/2016   Fatty liver 09/27/2016   Status post bilateral breast reduction 03/23/2016   Hypertrophy of breast 08/12/2015   Spondylolisthesis  of lumbar region 06/13/2015   History of lumbar fusion 04/07/2015   Radiculopathy, lumbar region 04/07/2015   Essential hypertension 02/14/2015   Pelvic pain 12/02/2014   Postprandial vomiting 02/04/2014   Obesity (BMI 30-39.9) 11/01/2013   Carotid artery stenosis 06/07/2013   Hereditary and idiopathic peripheral neuropathy 06/07/2013   History of migraine 05/04/2013   Type 2 diabetes mellitus with other specified complication (HCC) 05/05/2011   SVT (supraventricular tachycardia) (HCC) 11/03/2010   GERD (gastroesophageal reflux  disease) 10/29/2010   Chronic low back pain 10/29/2010   Hyperlipidemia associated with type 2 diabetes mellitus (HCC) 10/29/2010    PCP: Alvan Dorothyann BIRCH, MD   REFERRING PROVIDER: Curtis Debby PARAS,*   REFERRING DIAG: M50.30 (ICD-10-CM) - DDD (degenerative disc disease), cervical   THERAPY DIAG:  Cervicalgia  Acute pain of both shoulders  Abnormal posture  Muscle weakness (generalized)  Rationale for Evaluation and Treatment: Rehabilitation  ONSET DATE: 09/23/23  SUBJECTIVE:                                                                                                                                                                                                         SUBJECTIVE STATEMENT: Patient arrived to therapy without life alert necklace or SPC. Patient states the resisted rows exercise from HEP bothered her shoulders when she did it at home. Patient states no pain in shoulders this morning.  EVAL: Patient reporting her neck pain has moved into bilateral arms beginning months ago. Started in the left side and it was hard for her to use her left shoulder to wash her hair. Can't do in the tub, must stand at sink.  N/T started on the Left, but is now into the R UE as well. HA have improved since ED. Supposed to have surgery in low back in 3 weeks. She has spurs in her neck. Hand dominance: Right  PERTINENT HISTORY:  DM, DDD cervical  PAIN:  Are you having pain? Yes: NPRS scale: see below Pain location: B neck and shoulder  down arm to wrist with N/T, 5-6/10; Right 2-3/10 Pain description: agitating Aggravating factors: lifting, cleaning, bathing Relieving factors: nothing  PRECAUTIONS: None  RED FLAGS: None   FALLS:  Has patient fallen in last 6 months? Yes. Number of falls fell trying to bring her groceries on 08/05/23. She hit her head on the right side.   LIVING ENVIRONMENT: Lives with: lives alone Lives in: House/apartment Stairs: No Has  following equipment at home: Single point cane and Environmental consultant - 2 wheeled  OCCUPATION: retired  PLOF: Independent with  household mobility with device  PATIENT GOALS: Get my arms and shoulders to feel better and be able to do things like I used to do  NEXT MD VISIT: 11/18/23  OBJECTIVE:  Note: Objective measures were completed at Evaluation unless otherwise noted.  DIAGNOSTIC FINDINGS:  XR L shoulder 09/23/23 IMPRESSION:  1. No acute fracture or dislocation. Narrowing of the left hemicolon interval suggesting rotator cuff tear.  CT negative  PATIENT SURVEYS:  NDI:  NECK DISABILITY INDEX  Date: 10/11/23 Score  Pain intensity 3 = The pain is fairly severe at the moment  2. Personal care (washing, dressing, etc.) 3 =  I need some help but can manage most of my personal care  3. Lifting 4 =  I can only lift very light weights  4. Reading 2 =  I can read as much as I want with moderate pain in my neck  5. Headaches 2 =  I have moderate headaches, which come infrequently  6. Concentration 2 = I have a fair degree of difficulty in concentrating when I want to  7. Work 3 =  I cannot do my usual work  8. Driving 3 = I can't drive my car as long as I want because of moderate pain in my neck  9. Sleeping 3 =  My sleep is moderately disturbed (2-3 hrs sleepless)  10. Recreation 4 =  I can hardly do any recreation activities because of pain in my neck  Total 29/50   Minimum Detectable Change (90% confidence): 5 points or 10% points  COGNITION: Overall cognitive status: Within functional limits for tasks assessed   POSTURE: rounded shoulders, forward head, increased thoracic kyphosis, and flexed trunk   PALPATION: NT   CERVICAL ROM: WNL except below; no pain reported  Active ROM A/PROM (deg) eval  Flexion   Extension   Right lateral flexion   Left lateral flexion   Right rotation 50  Left rotation 53   (Blank rows = not tested)  UPPER EXTREMITY ROM:  Active ROM Right eval  Left eval  Shoulder flexion 95 sitting/150 supine 75 sitting/140 supine  Shoulder extension    Shoulder abduction 85 65  Shoulder adduction    Shoulder extension    Shoulder internal rotation L2 S1  Shoulder external rotation Behind head barely Unable to reach behind head  Elbow flexion    Elbow extension    Wrist flexion    Wrist extension    Wrist ulnar deviation    Wrist radial deviation    Wrist pronation    Wrist supination     (Blank rows = not tested)  UPPER EXTREMITY MMT: left shoulder flex/ABD 3+ with pain, R 4-, B IR/ER 4-  MMT Right eval Left eval  Shoulder flexion    Shoulder extension    Shoulder abduction    Shoulder adduction    Shoulder extension    Shoulder internal rotation    Shoulder external rotation    Middle trapezius    Lower trapezius    Elbow flexion    Elbow extension    Wrist flexion    Wrist extension    Wrist ulnar deviation    Wrist radial deviation    Wrist pronation    Wrist supination    Grip strength     (Blank rows = not tested)  CERVICAL SPECIAL TESTS:  Spurling's test: Negative   [facility] Adult PT Treatment:  DATE: 10/26/2023 Therapeutic Exercise: Pulleys shoulder flexion in scaption range x 2 min; horiz ER 10x5 Seated thoracic extension stretch + yoga mat roll --> try adding shoulder flexion stretch but too painful  Corner pec stretch 3x30 Supine: Shoulder flexion with dowel + noodle horizontal at back Chest stretch with hands behind head Bkwd shoulder circles Neuromuscular re-ed: Standing with back against noodle: Scap squeeze + chest lift with back against noodle on wall 10x5 Cervical retraction + folded pillow behind head 10x5 Shoulder ER + YTB x10 Therapeutic Activity: Serratus activation at wall Wall clock   [facility] Adult PT Treatment:                                                DATE: 10/24/2023 Therapeutic Exercise: Pulleys shoulder flexion in scaption range x 2  min; horiz ER 10x5 Supine: Shoulder flexion + 1#dowel x 10 AAROM shoulder abd with dowel Chest lift + scap squeeze Chest stretch with hands behind head Doorway pec stretch 2x30 Neuromuscular re-ed: Standing scap squeeze + chest lift with back against noodle on wall Supine cervical retraction + towel behind head 10x5 Resisted shoulder extension + RTB Serratus activation at wall Therapeutic Activity: TUG Resisted rows + GTB   PATIENT EDUCATION:  Education details: Updated HEP   Person educated: Patient Education method: Programmer, multimedia, Facilities manager, Verbal cues, and Handouts Education comprehension: verbalized understanding and returned demonstration  HOME EXERCISE PROGRAM: Access Code: [email or web address] URL: [email or web address] Date: 10/26/2023 Prepared by: Lamarr Price  Exercises - Supine Shoulder Flexion Extension AAROM with Dowel  - 2-3 x daily - 7 x weekly - 1-3 sets - 10 reps - Seated Scapular Retraction  - 1 x daily - 7 x weekly - 1-3 sets - 10 reps - 2-3 sec hold - Supine Chest Stretch with Elbows Bent  - 1 x daily - 7 x weekly - 3 sets - 10 reps - Supine Cervical Retraction with Towel  - 1 x daily - 7 x weekly - 3 sets - 10 reps - Doorway Pec Stretch at 60 Elevation  - 2 x daily - 7 x weekly - 1 sets - 3 reps - 30 sec hold - Serratus Activation at Wall  - 1 x daily - 7 x weekly - 3 sets - 10 reps - Wall Clock  - 1 x daily - 7 x weekly - 3 sets - 10 reps   ASSESSMENT:  CLINICAL IMPRESSION: Patient continues to required frequent cueing to maintain upright standing posture. Noted elevated shoulder with arm raises in left arm. Several episodes of LOB while walking around the clinic; patient regained balance with stepping strategy and no falls. Recommended patient have SPC with her for safety due to unsteadiness.   EVAL: Evaluation was shortended due to late arrival. Patient is a 73 y.o. female who was seen today for physical therapy evaluation and treatment  for B neck and shoulder pain. She reports N/T bilaterally as well. She has significant deficits in B shoulder ROM and strength as well as posture limiting her ability to perform [place] ADLs and lift items She presents with soft neck brace today which her neuro doc recommended she wear for 3 weeks. She reports no change in sx secondary to this as of today. Paitent will benefit from skilled PT to address these deficits.  Patient reports one fall in the past six months when trying  to lift and carry some water. She uses a SPC for balance. She also reports she is to have back surgery in the next month although this has not been scheduled yet.  OBJECTIVE IMPAIRMENTS: decreased activity tolerance, decreased balance, decreased ROM, decreased strength, increased muscle spasms, impaired flexibility, postural dysfunction, and pain.   ACTIVITY LIMITATIONS: carrying, lifting, sleeping, bathing, toileting, dressing, reach over head, and hygiene/grooming  PARTICIPATION LIMITATIONS: meal prep, cleaning, and laundry  PERSONAL FACTORS: Age, Fitness, Past/current experiences, and 1-2 comorbidities: LBP and DM are also affecting patient's functional outcome.   REHAB POTENTIAL: Good  CLINICAL DECISION MAKING: Evolving/moderate complexity  EVALUATION COMPLEXITY: Moderate   GOALS: Goals reviewed with patient? Yes  SHORT TERM GOALS: Target date: 11/08/2023   Patient will be independent with initial HEP.  Baseline:  Goal status: INITIAL 2.  Decreased neck and shoulder pain by 30%  Baseline:  Goal status: INITIAL 3.  Patient will demonstrate decreased fall risk by scoring < 25 sec on TUG Baseline: TBD 10/24/23: 29 sec with SPC Goal status: INITIAL    LONG TERM GOALS: Target date: 12/06/2023   Patient will be independent with advanced/ongoing HEP to improve outcomes and carryover.  Baseline:  Goal status: INITIAL  2.  Patient will report 75% improvement in neck and shoulder pain to improve QOL.  Baseline:   Goal status: INITIAL  3.  Patient will demonstrate functional B shoulder ROM to wash her hair in the bath.  Baseline:  Goal status:INITIAL  4. Patient will report 10 point decrease on NDI to demonstrate improved functional ability.  Baseline: 29/50 Goal status:INITIAL  5.  Improved B shoulder strength to 4+/5 or better to improve tolerance to [facility] activities.  Baseline:  Goal status: INITIAL  6.  Patient will report decreased N/T in B UE by 50% or more.    Baseline:  Goal status: INITIAL    PLAN:  PT FREQUENCY: 2x/week  PT DURATION: 8 weeks  PLANNED INTERVENTIONS: 97164- PT Re-evaluation, 97110-Therapeutic exercises, 97530- Therapeutic activity, 97112- Neuromuscular re-education, 97535- Self Care, 02859- Manual therapy, [id number]- Electrical stimulation (unattended), [id number]- Ultrasound, 02987- Traction (mechanical), [id number]- Ionotophoresis 4mg /ml Dexamethasone , 79439 (1-2 muscles), 20561 (3+ muscles)- Dry Needling, Patient/Family education, Balance training, Taping, Joint mobilization, Spinal mobilization, Cryotherapy, and Moist heat  PLAN FOR NEXT SESSION: work on postural strength, shoulder ROM and strength.   Lamarr Price, PTA 10/26/23 10:44 AM

## 2023-11-01 ENCOUNTER — Ambulatory Visit

## 2023-11-01 DIAGNOSIS — R296 Repeated falls: Secondary | ICD-10-CM | POA: Diagnosis not present

## 2023-11-01 DIAGNOSIS — M6281 Muscle weakness (generalized): Secondary | ICD-10-CM

## 2023-11-01 DIAGNOSIS — M25512 Pain in left shoulder: Secondary | ICD-10-CM | POA: Diagnosis not present

## 2023-11-01 DIAGNOSIS — R293 Abnormal posture: Secondary | ICD-10-CM

## 2023-11-01 DIAGNOSIS — M25511 Pain in right shoulder: Secondary | ICD-10-CM

## 2023-11-01 DIAGNOSIS — M542 Cervicalgia: Secondary | ICD-10-CM

## 2023-11-01 DIAGNOSIS — M5459 Other low back pain: Secondary | ICD-10-CM | POA: Diagnosis not present

## 2023-11-01 DIAGNOSIS — R2681 Unsteadiness on feet: Secondary | ICD-10-CM | POA: Diagnosis not present

## 2023-11-01 NOTE — Therapy (Signed)
 OUTPATIENT PHYSICAL THERAPY CERVICAL TREATMENT   Patient Name: Amanda Morris MRN: [id number] DOB:12/10/1950, 73 y.o., female Today's Date: 11/01/2023  END OF SESSION:  PT End of Session - 11/01/23 0802     Visit Number 4    Date for PT Re-Evaluation 12/06/23    Authorization Type [employer] MCR    Progress Note Due on Visit 10    PT Start Time 0802    PT Stop Time 0840    PT Time Calculation (min) 38 min    Activity Tolerance Patient tolerated treatment well    Behavior During Therapy WFL for tasks assessed/performed         Past Medical History:  Diagnosis Date   Allergy    Diabetes mellitus without complication (HCC)    type 2   GERD (gastroesophageal reflux disease)    Headache    History of hiatal hernia    Hypercholesterolemia    Numbness    left, outer thigh   Palpitations    in the past, no current issues per patient   Postlaminectomy syndrome    Past Surgical History:  Procedure Laterality Date   ABDOMINAL HYSTERECTOMY  1982   BACK SURGERY     x 2    BREAST REDUCTION SURGERY Bilateral 03/17/2016   Procedure: MAMMARY REDUCTION  (BREAST);  Surgeon: Estefana GORMAN Fritter, DO;  Location: [facility];  Service: Plastics;  Laterality: Bilateral;   BREAST SURGERY     COLONOSCOPY     LUMBAR WOUND DEBRIDEMENT N/A 05/22/2020   Procedure: Incision and drainage of lumbar wound;  Surgeon: Gillie Duncans, MD;  Location: [facility] OR;  Service: Neurosurgery;  Laterality: N/A;   SHOULDER SURGERY  06/2008, 09/2010   Right, by Dr. Lorence, then Dr. Wilbern   SPINAL CORD STIMULATOR INSERTION N/A 12/02/2017   Procedure: LUMBAR SPINAL CORD STIMULATOR INSERTION;  Surgeon: Gillie Duncans, MD;  Location: [facility] OR;  Service: Neurosurgery;  Laterality: N/A;   SPINAL CORD STIMULATOR TRIAL N/A 11/25/2017   Procedure: INSERTION LUMBAR SPINAL CORD STIMULATOR TRIAL  ;  Surgeon: Gillie Duncans, MD;  Location: [facility] OR;  Service: Neurosurgery;  Laterality: N/A;   INSERTION LUMBAR SPINAL  CORD STIMULATOR TRIAL     UPPER GI ENDOSCOPY     Patient Active Problem List   Diagnosis Date Noted   DDD (degenerative disc disease), cervical 10/07/2023   IBS (irritable bowel syndrome) 04/28/2023   Hiatal hernia 04/28/2023   Osteopenia after menopause 02/18/2023   Frequent falls 02/10/2023   Screening for osteoporosis 02/08/2023   Lung nodule 12/24/2022   Atherosclerosis of aorta (HCC) 11/02/2022   Right flank pain 08/17/2022   Left hip pain 02/23/2021   Low back pain 02/23/2021   Gait abnormality 02/23/2021   Bilateral lower extremity edema 01/22/2021   Elevated CK 10/12/2020   Spinal stenosis 10/09/2020   Lumbar adjacent segment disease with spondylolisthesis 10/09/2020   Spinal stenosis, lumbar region with neurogenic claudication 04/16/2020   Peripheral neuropathy 11/28/2019   Paresthesia and pain of both upper extremities 11/12/2019   Chronic midline low back pain with bilateral sciatica 10/30/2019   Meralgia paresthetica, left 09/12/2019   Everitt Quervain's disease (tenosynovitis) 02/06/2019   Acute bilateral low back pain without sciatica 12/01/2018   Failed back syndrome of lumbar spine 11/25/2017   Inflammation of sacroiliac joint (HCC) 06/07/2017   Displacement of lumbar intervertebral disc 11/22/2016   Fatty liver 09/27/2016   Status post bilateral breast reduction 03/23/2016   Hypertrophy of breast 08/12/2015   Spondylolisthesis  of lumbar region 06/13/2015   History of lumbar fusion 04/07/2015   Radiculopathy, lumbar region 04/07/2015   Essential hypertension 02/14/2015   Pelvic pain 12/02/2014   Postprandial vomiting 02/04/2014   Obesity (BMI 30-39.9) 11/01/2013   Carotid artery stenosis 06/07/2013   Hereditary and idiopathic peripheral neuropathy 06/07/2013   History of migraine 05/04/2013   Type 2 diabetes mellitus with other specified complication (HCC) 05/05/2011   SVT (supraventricular tachycardia) (HCC) 11/03/2010   GERD (gastroesophageal reflux  disease) 10/29/2010   Chronic low back pain 10/29/2010   Hyperlipidemia associated with type 2 diabetes mellitus (HCC) 10/29/2010    PCP: Alvan Dorothyann BIRCH, MD   REFERRING PROVIDER: Curtis Debby PARAS,*   REFERRING DIAG: M50.30 (ICD-10-CM) - DDD (degenerative disc disease), cervical   THERAPY DIAG:  Cervicalgia  Acute pain of both shoulders  Abnormal posture  Muscle weakness (generalized)  Rationale for Evaluation and Treatment: Rehabilitation  ONSET DATE: 09/23/23  SUBJECTIVE:                                                                                                                                                                                                         SUBJECTIVE STATEMENT: Patient reports she has back surgery scheduled for Friday and would like to cancel her Tuesday PT appt; states she thinks they are taking the spine stimulator out but is not sure. Patient states she has pain in front of shoulder when brining her arms down from overhead. Patient states she has not had any N/T down arm.  EVAL: Patient reporting her neck pain has moved into bilateral arms beginning months ago. Started in the left side and it was hard for her to use her left shoulder to wash her hair. Can't do in the tub, must stand at sink.  N/T started on the Left, but is now into the R UE as well. HA have improved since ED. Supposed to have surgery in low back in 3 weeks. She has spurs in her neck. Hand dominance: Right  PERTINENT HISTORY:  DM, DDD cervical  PAIN:  Are you having pain? Yes: NPRS scale: see below Pain location: B neck and shoulder  down arm to wrist with N/T, 5-6/10; Right 2-3/10 Pain description: agitating Aggravating factors: lifting, cleaning, bathing Relieving factors: nothing  PRECAUTIONS: None  RED FLAGS: None   FALLS:  Has patient fallen in last 6 months? Yes. Number of falls fell trying to bring her groceries on 08/05/23. She hit her head on the  right side.   LIVING ENVIRONMENT: Lives with: lives alone Lives in:  House/apartment Stairs: No Has following equipment at home: Single point cane and Walker - 2 wheeled  OCCUPATION: retired  PLOF: Independent with household mobility with device  PATIENT GOALS: Get my arms and shoulders to feel better and be able to do things like I used to do  NEXT MD VISIT: 11/18/23  OBJECTIVE:  Note: Objective measures were completed at Evaluation unless otherwise noted.  DIAGNOSTIC FINDINGS:  XR L shoulder 09/23/23 IMPRESSION:  1. No acute fracture or dislocation. Narrowing of the left hemicolon interval suggesting rotator cuff tear.  CT negative  PATIENT SURVEYS:  NDI:  NECK DISABILITY INDEX  Date: 10/11/23 Score  Pain intensity 3 = The pain is fairly severe at the moment  2. Personal care (washing, dressing, etc.) 3 =  I need some help but can manage most of my personal care  3. Lifting 4 =  I can only lift very light weights  4. Reading 2 =  I can read as much as I want with moderate pain in my neck  5. Headaches 2 =  I have moderate headaches, which come infrequently  6. Concentration 2 = I have a fair degree of difficulty in concentrating when I want to  7. Work 3 =  I cannot do my usual work  8. Driving 3 = I can't drive my car as long as I want because of moderate pain in my neck  9. Sleeping 3 =  My sleep is moderately disturbed (2-3 hrs sleepless)  10. Recreation 4 =  I can hardly do any recreation activities because of pain in my neck  Total 29/50   Minimum Detectable Change (90% confidence): 5 points or 10% points  COGNITION: Overall cognitive status: Within functional limits for tasks assessed   POSTURE: rounded shoulders, forward head, increased thoracic kyphosis, and flexed trunk   PALPATION: NT   CERVICAL ROM: WNL except below; no pain reported  Active ROM A/PROM (deg) eval  Flexion   Extension   Right lateral flexion   Left lateral flexion   Right rotation  50  Left rotation 53   (Blank rows = not tested)  UPPER EXTREMITY ROM:  Active ROM Right eval Left eval  Shoulder flexion 95 sitting/150 supine 75 sitting/140 supine  Shoulder extension    Shoulder abduction 85 65  Shoulder adduction    Shoulder extension    Shoulder internal rotation L2 S1  Shoulder external rotation Behind head barely Unable to reach behind head  Elbow flexion    Elbow extension    Wrist flexion    Wrist extension    Wrist ulnar deviation    Wrist radial deviation    Wrist pronation    Wrist supination     (Blank rows = not tested)  UPPER EXTREMITY MMT: left shoulder flex/ABD 3+ with pain, R 4-, B IR/ER 4-  MMT Right eval Left eval  Shoulder flexion    Shoulder extension    Shoulder abduction    Shoulder adduction    Shoulder extension    Shoulder internal rotation    Shoulder external rotation    Middle trapezius    Lower trapezius    Elbow flexion    Elbow extension    Wrist flexion    Wrist extension    Wrist ulnar deviation    Wrist radial deviation    Wrist pronation    Wrist supination    Grip strength     (Blank rows = not tested)  CERVICAL SPECIAL TESTS:  Spurling's test: Negative   [facility] Adult PT Treatment:                                                DATE: 11/01/2023 Therapeutic Exercise: Doorway pec stretch 60 degrees 3x30 Corner pec stretch 90 degrees 2x30 Supine shoulder AAROM ER & ABD 10x5 each Seated UT stretch --> added arm stretch to side Neuromuscular re-ed: Seated scap retraction 10x5 Serratus activation wall slides --> added lift off Supine elbow press down + chest lift 10x5   [facility] Adult PT Treatment:                                                DATE: 10/26/2023 Therapeutic Exercise: Pulleys shoulder flexion in scaption range x 2 min; horiz ER 10x5 Seated thoracic extension stretch + yoga mat roll --> try adding shoulder flexion stretch but too painful  Corner pec stretch 3x30 Supine: Shoulder  flexion with dowel + noodle horizontal at back Chest stretch with hands behind head Bkwd shoulder circles Neuromuscular re-ed: Standing with back against noodle: Scap squeeze + chest lift with back against noodle on wall 10x5 Cervical retraction + folded pillow behind head 10x5 Shoulder ER + YTB x10 Therapeutic Activity: Serratus activation at wall Wall clock   [facility] Adult PT Treatment:                                                DATE: 10/24/2023 Therapeutic Exercise: Pulleys shoulder flexion in scaption range x 2 min; horiz ER 10x5 Supine: Shoulder flexion + 1#dowel x 10 AAROM shoulder abd with dowel Chest lift + scap squeeze Chest stretch with hands behind head Doorway pec stretch 2x30 Neuromuscular re-ed: Standing scap squeeze + chest lift with back against noodle on wall Supine cervical retraction + towel behind head 10x5 Resisted shoulder extension + RTB Serratus activation at wall Therapeutic Activity: TUG Resisted rows + GTB   PATIENT EDUCATION:  Education details: Updated HEP   Person educated: Patient Education method: Programmer, multimedia, Facilities manager, Verbal cues, and Handouts Education comprehension: verbalized understanding and returned demonstration  HOME EXERCISE PROGRAM: Access Code: [email or web address] URL: [email or web address] Date: 11/01/2023 Prepared by: Lamarr Price  Exercises - Supine Shoulder Flexion Extension AAROM with Dowel  - 2-3 x daily - 7 x weekly - 1-3 sets - 10 reps - Seated Scapular Retraction  - 1 x daily - 7 x weekly - 1-3 sets - 10 reps - 2-3 sec hold - Supine Chest Stretch with Elbows Bent  - 1 x daily - 7 x weekly - 3 sets - 10 reps - Supine Cervical Retraction with Towel  - 1 x daily - 7 x weekly - 3 sets - 10 reps - Doorway Pec Stretch at 60 Elevation  - 2 x daily - 7 x weekly - 1 sets - 3 reps - 30 sec hold - Serratus Activation at Wall  - 1 x daily - 7 x weekly - 3 sets - 10 reps - Wall Clock  - 1 x daily - 7 x weekly -  3 sets - 10 reps - Seated Thoracic Extension  Arms Overhead  - 1 x daily - 7 x weekly - 3 sets - 10 reps   ASSESSMENT:  CLINICAL IMPRESSION: Patient presents with postural lean to left side and continues to have difficulty stabilizing scapula with overhead arm raises. Minimal change noted in postural awareness and ability to maintain corrective cues. Patient will continue to benefit from skilled therapy to address postural deficits and shoulder pain.  EVAL: Evaluation was shortended due to late arrival. Patient is a 73 y.o. female who was seen today for physical therapy evaluation and treatment for B neck and shoulder pain. She reports N/T bilaterally as well. She has significant deficits in B shoulder ROM and strength as well as posture limiting her ability to perform [place] ADLs and lift items She presents with soft neck brace today which her neuro doc recommended she wear for 3 weeks. She reports no change in sx secondary to this as of today. Paitent will benefit from skilled PT to address these deficits.  Patient reports one fall in the past six months when trying to lift and carry some water. She uses a SPC for balance. She also reports she is to have back surgery in the next month although this has not been scheduled yet.  OBJECTIVE IMPAIRMENTS: decreased activity tolerance, decreased balance, decreased ROM, decreased strength, increased muscle spasms, impaired flexibility, postural dysfunction, and pain.   ACTIVITY LIMITATIONS: carrying, lifting, sleeping, bathing, toileting, dressing, reach over head, and hygiene/grooming  PARTICIPATION LIMITATIONS: meal prep, cleaning, and laundry  PERSONAL FACTORS: Age, Fitness, Past/current experiences, and 1-2 comorbidities: LBP and DM are also affecting patient's functional outcome.   REHAB POTENTIAL: Good  CLINICAL DECISION MAKING: Evolving/moderate complexity  EVALUATION COMPLEXITY: Moderate   GOALS: Goals reviewed with patient? Yes  SHORT TERM  GOALS: Target date: 11/08/2023   Patient will be independent with initial HEP.  Baseline:  Goal status: INITIAL 2.  Decreased neck and shoulder pain by 30%  Baseline:  Goal status: INITIAL 3.  Patient will demonstrate decreased fall risk by scoring < 25 sec on TUG Baseline: TBD 10/24/23: 29 sec with SPC Goal status: INITIAL    LONG TERM GOALS: Target date: 12/06/2023   Patient will be independent with advanced/ongoing HEP to improve outcomes and carryover.  Baseline:  Goal status: INITIAL  2.  Patient will report 75% improvement in neck and shoulder pain to improve QOL.  Baseline:  Goal status: INITIAL  3.  Patient will demonstrate functional B shoulder ROM to wash her hair in the bath.  Baseline:  Goal status:INITIAL  4. Patient will report 10 point decrease on NDI to demonstrate improved functional ability.  Baseline: 29/50 Goal status:INITIAL  5.  Improved B shoulder strength to 4+/5 or better to improve tolerance to [facility] activities.  Baseline:  Goal status: INITIAL  6.  Patient will report decreased N/T in B UE by 50% or more.    Baseline:  Goal status: INITIAL    PLAN:  PT FREQUENCY: 2x/week  PT DURATION: 8 weeks  PLANNED INTERVENTIONS: 97164- PT Re-evaluation, 97110-Therapeutic exercises, 97530- Therapeutic activity, 97112- Neuromuscular re-education, 97535- Self Care, 02859- Manual therapy, [id number]- Electrical stimulation (unattended), [id number]- Ultrasound, 02987- Traction (mechanical), [id number]- Ionotophoresis 4mg /ml Dexamethasone , 79439 (1-2 muscles), 20561 (3+ muscles)- Dry Needling, Patient/Family education, Balance training, Taping, Joint mobilization, Spinal mobilization, Cryotherapy, and Moist heat  PLAN FOR NEXT SESSION: work on postural strength, shoulder ROM and strength.   Lamarr Price, PTA 11/01/23 8:41 AM

## 2023-11-03 ENCOUNTER — Ambulatory Visit

## 2023-11-03 DIAGNOSIS — M5459 Other low back pain: Secondary | ICD-10-CM

## 2023-11-03 DIAGNOSIS — M25511 Pain in right shoulder: Secondary | ICD-10-CM | POA: Diagnosis not present

## 2023-11-03 DIAGNOSIS — R293 Abnormal posture: Secondary | ICD-10-CM | POA: Diagnosis not present

## 2023-11-03 DIAGNOSIS — M6281 Muscle weakness (generalized): Secondary | ICD-10-CM | POA: Diagnosis not present

## 2023-11-03 DIAGNOSIS — R2681 Unsteadiness on feet: Secondary | ICD-10-CM

## 2023-11-03 DIAGNOSIS — R296 Repeated falls: Secondary | ICD-10-CM

## 2023-11-03 DIAGNOSIS — M25512 Pain in left shoulder: Secondary | ICD-10-CM | POA: Diagnosis not present

## 2023-11-03 DIAGNOSIS — M542 Cervicalgia: Secondary | ICD-10-CM

## 2023-11-03 NOTE — Therapy (Addendum)
 OUTPATIENT PHYSICAL THERAPY CERVICAL TREATMENT AND DISCHARGE SUMMARY   Patient Name: Amanda Morris MRN: [id number] DOB:Aug 02, 1950, 73 y.o., female Today's Date: 11/03/2023  END OF SESSION:  PT End of Session - 11/03/23 0859     Visit Number 5    Date for PT Re-Evaluation 12/06/23    Authorization Type [employer] MCR    Progress Note Due on Visit 10    PT Start Time 0855    PT Stop Time 0933    PT Time Calculation (min) 38 min    Activity Tolerance Patient tolerated treatment well    Behavior During Therapy WFL for tasks assessed/performed         Past Medical History:  Diagnosis Date   Allergy    Diabetes mellitus without complication (HCC)    type 2   GERD (gastroesophageal reflux disease)    Headache    History of hiatal hernia    Hypercholesterolemia    Numbness    left, outer thigh   Palpitations    in the past, no current issues per patient   Postlaminectomy syndrome    Past Surgical History:  Procedure Laterality Date   ABDOMINAL HYSTERECTOMY  1982   BACK SURGERY     x 2    BREAST REDUCTION SURGERY Bilateral 03/17/2016   Procedure: MAMMARY REDUCTION  (BREAST);  Surgeon: Estefana GORMAN Fritter, DO;  Location: [facility];  Service: Plastics;  Laterality: Bilateral;   BREAST SURGERY     COLONOSCOPY     LUMBAR WOUND DEBRIDEMENT N/A 05/22/2020   Procedure: Incision and drainage of lumbar wound;  Surgeon: Gillie Duncans, MD;  Location[facility] OR;  Service: Neurosurgery;  Laterality: N/A;   SHOULDER SURGERY  06/2008, 09/2010   Right, by Dr. Lorence, then Dr. Wilbern   SPINAL CORD STIMULATOR INSERTION N/A 12/02/2017   Procedure: LUMBAR SPINAL CORD STIMULATOR INSERTION;  Surgeon: Gillie Duncans, MD;  Location: [facility] OR;  Service: Neurosurgery;  Laterality: N/A;   SPINAL CORD STIMULATOR TRIAL N/A 11/25/2017   Procedure: INSERTION LUMBAR SPINAL CORD STIMULATOR TRIAL  ;  Surgeon: Gillie Duncans, MD;  Location: [facility] OR;  Service: Neurosurgery;  Laterality: N/A;    INSERTION LUMBAR SPINAL CORD STIMULATOR TRIAL     UPPER GI ENDOSCOPY     Patient Active Problem List   Diagnosis Date Noted   DDD (degenerative disc disease), cervical 10/07/2023   IBS (irritable bowel syndrome) 04/28/2023   Hiatal hernia 04/28/2023   Osteopenia after menopause 02/18/2023   Frequent falls 02/10/2023   Screening for osteoporosis 02/08/2023   Lung nodule 12/24/2022   Atherosclerosis of aorta (HCC) 11/02/2022   Right flank pain 08/17/2022   Left hip pain 02/23/2021   Low back pain 02/23/2021   Gait abnormality 02/23/2021   Bilateral lower extremity edema 01/22/2021   Elevated CK 10/12/2020   Spinal stenosis 10/09/2020   Lumbar adjacent segment disease with spondylolisthesis 10/09/2020   Spinal stenosis, lumbar region with neurogenic claudication 04/16/2020   Peripheral neuropathy 11/28/2019   Paresthesia and pain of both upper extremities 11/12/2019   Chronic midline low back pain with bilateral sciatica 10/30/2019   Meralgia paresthetica, left 09/12/2019   Everitt Quervain's disease (tenosynovitis) 02/06/2019   Acute bilateral low back pain without sciatica 12/01/2018   Failed back syndrome of lumbar spine 11/25/2017   Inflammation of sacroiliac joint (HCC) 06/07/2017   Displacement of lumbar intervertebral disc 11/22/2016   Fatty liver 09/27/2016   Status post bilateral breast reduction 03/23/2016   Hypertrophy of breast 08/12/2015  Spondylolisthesis of lumbar region 06/13/2015   History of lumbar fusion 04/07/2015   Radiculopathy, lumbar region 04/07/2015   Essential hypertension 02/14/2015   Pelvic pain 12/02/2014   Postprandial vomiting 02/04/2014   Obesity (BMI 30-39.9) 11/01/2013   Carotid artery stenosis 06/07/2013   Hereditary and idiopathic peripheral neuropathy 06/07/2013   History of migraine 05/04/2013   Type 2 diabetes mellitus with other specified complication (HCC) 05/05/2011   SVT (supraventricular tachycardia) (HCC) 11/03/2010   GERD  (gastroesophageal reflux disease) 10/29/2010   Chronic low back pain 10/29/2010   Hyperlipidemia associated with type 2 diabetes mellitus (HCC) 10/29/2010    PCP: Alvan Dorothyann BIRCH, MD   REFERRING PROVIDER: Curtis Debby PARAS,*   REFERRING DIAG: M50.30 (ICD-10-CM) - DDD (degenerative disc disease), cervical   THERAPY DIAG:  Cervicalgia  Acute pain of both shoulders  Abnormal posture  Muscle weakness (generalized)  Other low back pain  Unsteadiness on feet  Repeated falls  Rationale for Evaluation and Treatment: Rehabilitation  ONSET DATE: 09/23/23  SUBJECTIVE:                                                                                                                                                                                                         SUBJECTIVE STATEMENT: Patient reports she has procedure scheduled on Friday to take out her spine stimulator; after discussion she is in agreement to put PT on hold until after procedure.   EVAL: Patient reporting her neck pain has moved into bilateral arms beginning months ago. Started in the left side and it was hard for her to use her left shoulder to wash her hair. Can't do in the tub, must stand at sink.  N/T started on the Left, but is now into the R UE as well. HA have improved since ED. Supposed to have surgery in low back in 3 weeks. She has spurs in her neck. Hand dominance: Right  PERTINENT HISTORY:  DM, DDD cervical  PAIN:  Are you having pain? Yes: NPRS scale: see below Pain location: B neck and shoulder  down arm to wrist with N/T, 5-6/10; Right 2-3/10 Pain description: agitating Aggravating factors: lifting, cleaning, bathing Relieving factors: nothing  PRECAUTIONS: None  RED FLAGS: None   FALLS:  Has patient fallen in last 6 months? Yes. Number of falls fell trying to bring her groceries on 08/05/23. She hit her head on the right side.   LIVING ENVIRONMENT: Lives with: lives  alone Lives in: House/apartment Stairs: No Has following equipment at home: Single point cane and Environmental consultant - 2 wheeled  OCCUPATION: retired  PLOF: Independent with household mobility with device  PATIENT GOALS: Get my arms and shoulders to feel better and be able to do things like I used to do  NEXT MD VISIT: 11/18/23  OBJECTIVE:  Note: Objective measures were completed at Evaluation unless otherwise noted.  DIAGNOSTIC FINDINGS:  XR L shoulder 09/23/23 IMPRESSION:  1. No acute fracture or dislocation. Narrowing of the left hemicolon interval suggesting rotator cuff tear.  CT negative  PATIENT SURVEYS:  NDI:  NECK DISABILITY INDEX  Date: 10/11/23 Score  Pain intensity 3 = The pain is fairly severe at the moment  2. Personal care (washing, dressing, etc.) 3 =  I need some help but can manage most of my personal care  3. Lifting 4 =  I can only lift very light weights  4. Reading 2 =  I can read as much as I want with moderate pain in my neck  5. Headaches 2 =  I have moderate headaches, which come infrequently  6. Concentration 2 = I have a fair degree of difficulty in concentrating when I want to  7. Work 3 =  I cannot do my usual work  8. Driving 3 = I can't drive my car as long as I want because of moderate pain in my neck  9. Sleeping 3 =  My sleep is moderately disturbed (2-3 hrs sleepless)  10. Recreation 4 =  I can hardly do any recreation activities because of pain in my neck  Total 29/50   Minimum Detectable Change (90% confidence): 5 points or 10% points  COGNITION: Overall cognitive status: Within functional limits for tasks assessed   POSTURE: rounded shoulders, forward head, increased thoracic kyphosis, and flexed trunk   PALPATION: NT   CERVICAL ROM: WNL except below; no pain reported  Active ROM A/PROM (deg) eval  Flexion   Extension   Right lateral flexion   Left lateral flexion   Right rotation 50  Left rotation 53   (Blank rows = not  tested)  UPPER EXTREMITY ROM:  Active ROM Right eval Left eval  Shoulder flexion 95 sitting/150 supine 75 sitting/140 supine  Shoulder extension    Shoulder abduction 85 65  Shoulder adduction    Shoulder extension    Shoulder internal rotation L2 S1  Shoulder external rotation Behind head barely Unable to reach behind head  Elbow flexion    Elbow extension    Wrist flexion    Wrist extension    Wrist ulnar deviation    Wrist radial deviation    Wrist pronation    Wrist supination     (Blank rows = not tested)  UPPER EXTREMITY MMT: left shoulder flex/ABD 3+ with pain, R 4-, B IR/ER 4-  MMT Right eval Left eval  Shoulder flexion    Shoulder extension    Shoulder abduction    Shoulder adduction    Shoulder extension    Shoulder internal rotation    Shoulder external rotation    Middle trapezius    Lower trapezius    Elbow flexion    Elbow extension    Wrist flexion    Wrist extension    Wrist ulnar deviation    Wrist radial deviation    Wrist pronation    Wrist supination    Grip strength     (Blank rows = not tested)  CERVICAL SPECIAL TESTS:  Spurling's test: Negative   [facility] Adult PT Treatment:  DATE: 11/03/2023 Therapeutic Exercise: Shoulder flexion stretch on high table Seated table slides: flexion, scaption, abduction (bil) Lt bicep stretch holding edge of table --> discontinued d/t numbness Doorway pec stretch 60 degrees x3 Neuromuscular re-ed: Serratus activation wall slides --> added lower trap lift off  Wall clock --> discontinued on Lt d/t increased pain Rows + chest lift with GTB Resisted shoulder extension press down with RTB   [facility] Adult PT Treatment:                                                DATE: 11/01/2023 Therapeutic Exercise: Doorway pec stretch 60 degrees 3x30 Corner pec stretch 90 degrees 2x30 Supine shoulder AAROM ER & ABD 10x5 each Seated UT stretch --> added arm stretch to  side Neuromuscular re-ed: Seated scap retraction 10x5 Serratus activation wall slides --> added lift off Supine elbow press down + chest lift 10x5   [facility] Adult PT Treatment:                                                DATE: 10/26/2023 Therapeutic Exercise: Pulleys shoulder flexion in scaption range x 2 min; horiz ER 10x5 Seated thoracic extension stretch + yoga mat roll --> try adding shoulder flexion stretch but too painful  Corner pec stretch 3x30 Supine: Shoulder flexion with dowel + noodle horizontal at back Chest stretch with hands behind head Bkwd shoulder circles Neuromuscular re-ed: Standing with back against noodle: Scap squeeze + chest lift with back against noodle on wall 10x5 Cervical retraction + folded pillow behind head 10x5 Shoulder ER + YTB x10 Therapeutic Activity: Serratus activation at wall Wall clock    PATIENT EDUCATION:  Education details: Updated HEP   Person educated: Patient Education method: Programmer, multimedia, Facilities manager, Verbal cues, and Handouts Education comprehension: verbalized understanding and returned demonstration  HOME EXERCISE PROGRAM: Access Code: [email or web address] URL: [email or web address] Date: 11/03/2023 Prepared by: Lamarr Price  Exercises - Seated Scapular Retraction  - 1 x daily - 7 x weekly - 1-3 sets - 10 reps - 2-3 sec hold - Supine Chest Stretch with Elbows Bent  - 1 x daily - 7 x weekly - 3 sets - 10 reps - Supine Cervical Retraction with Towel  - 1 x daily - 7 x weekly - 3 sets - 10 reps - Doorway Pec Stretch at 60 Elevation  - 2 x daily - 7 x weekly - 1 sets - 3 reps - 30 sec hold - Serratus Activation at Wall  - 1 x daily - 7 x weekly - 3 sets - 10 reps - Wall Clock  - 1 x daily - 7 x weekly - 3 sets - 10 reps - Seated Thoracic Extension Arms Overhead  - 1 x daily - 7 x weekly - 3 sets - 10 reps - Seated Shoulder Flexion Towel Slide at Table Top  - 1 x daily - 7 x weekly - 1 sets - 10 reps - 5 sec hold -  Seated Shoulder Abduction Towel Slide at Table Top  - 1 x daily - 7 x weekly - 1 sets - 10 reps - 5 sec hold - Standing 'L' Stretch at Counter  - 2 x daily - 7 x weekly - 1  sets - 5 reps - 10 sec hold   ASSESSMENT:  CLINICAL IMPRESSION: Increased pain along upper arm on left side during wall clock exercise; attempted bicep stretch, however discontinued due to increased numbness down arm. Patient continues to demonstrate elevated shoulder compensation with overhead arm raises and decreased scapula stabilization. Tactile cues continue to be provided to promote scapula retraction mechanics, however patient unable to maintain alignment independently. PT will be placed on hold until after patient's spinal procedure on Friday 11/04/2023.  EVAL: Evaluation was shortended due to late arrival. Patient is a 73 y.o. female who was seen today for physical therapy evaluation and treatment for B neck and shoulder pain. She reports N/T bilaterally as well. She has significant deficits in B shoulder ROM and strength as well as posture limiting her ability to perform [place] ADLs and lift items She presents with soft neck brace today which her neuro doc recommended she wear for 3 weeks. She reports no change in sx secondary to this as of today. Paitent will benefit from skilled PT to address these deficits.  Patient reports one fall in the past six months when trying to lift and carry some water. She uses a SPC for balance. She also reports she is to have back surgery in the next month although this has not been scheduled yet.  OBJECTIVE IMPAIRMENTS: decreased activity tolerance, decreased balance, decreased ROM, decreased strength, increased muscle spasms, impaired flexibility, postural dysfunction, and pain.   ACTIVITY LIMITATIONS: carrying, lifting, sleeping, bathing, toileting, dressing, reach over head, and hygiene/grooming  PARTICIPATION LIMITATIONS: meal prep, cleaning, and laundry  PERSONAL FACTORS: Age, Fitness,  Past/current experiences, and 1-2 comorbidities: LBP and DM are also affecting patient's functional outcome.   REHAB POTENTIAL: Good  CLINICAL DECISION MAKING: Evolving/moderate complexity  EVALUATION COMPLEXITY: Moderate   GOALS: Goals reviewed with patient? Yes  SHORT TERM GOALS: Target date: 11/08/2023   Patient will be independent with initial HEP.  Baseline:  Goal status: INITIAL 2.  Decreased neck and shoulder pain by 30%  Baseline:  Goal status: INITIAL 3.  Patient will demonstrate decreased fall risk by scoring < 25 sec on TUG Baseline: TBD 10/24/23: 29 sec with SPC Goal status: INITIAL    LONG TERM GOALS: Target date: 12/06/2023   Patient will be independent with advanced/ongoing HEP to improve outcomes and carryover.  Baseline:  Goal status: INITIAL  2.  Patient will report 75% improvement in neck and shoulder pain to improve QOL.  Baseline:  Goal status: INITIAL  3.  Patient will demonstrate functional B shoulder ROM to wash her hair in the bath.  Baseline:  Goal status:INITIAL  4. Patient will report 10 point decrease on NDI to demonstrate improved functional ability.  Baseline: 29/50 Goal status:INITIAL  5.  Improved B shoulder strength to 4+/5 or better to improve tolerance to [facility] activities.  Baseline:  Goal status: INITIAL  6.  Patient will report decreased N/T in B UE by 50% or more.    Baseline:  Goal status: INITIAL    PLAN:  PT FREQUENCY: 2x/week  PT DURATION: 8 weeks  PLANNED INTERVENTIONS: 97164- PT Re-evaluation, 97110-Therapeutic exercises, 97530- Therapeutic activity, 97112- Neuromuscular re-education, 97535- Self Care, 02859- Manual therapy, [id number]- Electrical stimulation (unattended), [id number]- Ultrasound, 02987- Traction (mechanical), [id number]- Ionotophoresis 4mg /ml Dexamethasone , 79439 (1-2 muscles), 20561 (3+ muscles)- Dry Needling, Patient/Family education, Balance training, Taping, Joint mobilization, Spinal mobilization, Cryotherapy,  and Moist heat  PLAN FOR NEXT SESSION: PT on hold until after spinal procedure (taking out spine stimulator?).  work on postural strength, shoulder ROM and strength.   Lamarr Price, PTA 11/03/23 10:16 AM    PHYSICAL THERAPY DISCHARGE SUMMARY  Visits from Start of Care: 5  Current functional level related to goals / functional outcomes: Unable to assess as pt did not return for f/u visits.    Remaining deficits: Unable to assess as pt did not return for f/u visits.    Education / Equipment: HEP   Patient agrees to discharge. Patient goals were not met. Patient is being discharged due to not returning since the last visit.  Mliss Cummins, PT 12/27/23 8:54 AM  [facility] at [facility] [address] [place], [place] [address]  [phone] (office) [phone] (fax)

## 2023-11-04 DIAGNOSIS — I10 Essential (primary) hypertension: Secondary | ICD-10-CM | POA: Diagnosis not present

## 2023-11-04 DIAGNOSIS — T85192A Other mechanical complication of implanted electronic neurostimulator (electrode) of spinal cord, initial encounter: Secondary | ICD-10-CM | POA: Diagnosis not present

## 2023-11-04 DIAGNOSIS — Z791 Long term (current) use of non-steroidal anti-inflammatories (NSAID): Secondary | ICD-10-CM | POA: Diagnosis not present

## 2023-11-04 DIAGNOSIS — Z79899 Other long term (current) drug therapy: Secondary | ICD-10-CM | POA: Diagnosis not present

## 2023-11-04 DIAGNOSIS — E119 Type 2 diabetes mellitus without complications: Secondary | ICD-10-CM | POA: Diagnosis not present

## 2023-11-04 DIAGNOSIS — Z7982 Long term (current) use of aspirin: Secondary | ICD-10-CM | POA: Diagnosis not present

## 2023-11-04 DIAGNOSIS — Z885 Allergy status to narcotic agent status: Secondary | ICD-10-CM | POA: Diagnosis not present

## 2023-11-04 DIAGNOSIS — Z888 Allergy status to other drugs, medicaments and biological substances status: Secondary | ICD-10-CM | POA: Diagnosis not present

## 2023-11-04 DIAGNOSIS — Z7984 Long term (current) use of oral hypoglycemic drugs: Secondary | ICD-10-CM | POA: Diagnosis not present

## 2023-11-04 DIAGNOSIS — Z886 Allergy status to analgesic agent status: Secondary | ICD-10-CM | POA: Diagnosis not present

## 2023-11-04 DIAGNOSIS — K219 Gastro-esophageal reflux disease without esophagitis: Secondary | ICD-10-CM | POA: Diagnosis not present

## 2023-11-04 DIAGNOSIS — M4802 Spinal stenosis, cervical region: Secondary | ICD-10-CM | POA: Diagnosis not present

## 2023-11-04 DIAGNOSIS — Z87891 Personal history of nicotine dependence: Secondary | ICD-10-CM | POA: Diagnosis not present

## 2023-11-04 DIAGNOSIS — T85193A Other mechanical complication of implanted electronic neurostimulator, generator, initial encounter: Secondary | ICD-10-CM | POA: Diagnosis not present

## 2023-11-08 ENCOUNTER — Encounter: Admitting: Physical Therapy

## 2023-11-10 ENCOUNTER — Encounter: Admitting: Physical Therapy

## 2023-11-15 ENCOUNTER — Encounter: Admitting: Physical Therapy

## 2023-11-16 ENCOUNTER — Ambulatory Visit: Admitting: Family Medicine

## 2023-11-17 ENCOUNTER — Encounter

## 2023-11-17 DIAGNOSIS — M5416 Radiculopathy, lumbar region: Secondary | ICD-10-CM | POA: Diagnosis not present

## 2023-11-18 ENCOUNTER — Ambulatory Visit: Admitting: Sports Medicine

## 2023-11-18 DIAGNOSIS — R110 Nausea: Secondary | ICD-10-CM | POA: Diagnosis not present

## 2023-11-18 DIAGNOSIS — R112 Nausea with vomiting, unspecified: Secondary | ICD-10-CM | POA: Diagnosis not present

## 2023-11-18 DIAGNOSIS — K219 Gastro-esophageal reflux disease without esophagitis: Secondary | ICD-10-CM | POA: Diagnosis not present

## 2023-11-18 DIAGNOSIS — R1013 Epigastric pain: Secondary | ICD-10-CM | POA: Diagnosis not present

## 2023-11-18 DIAGNOSIS — R194 Change in bowel habit: Secondary | ICD-10-CM | POA: Diagnosis not present

## 2023-11-18 DIAGNOSIS — Z860100 Personal history of colon polyps, unspecified: Secondary | ICD-10-CM | POA: Diagnosis not present

## 2023-11-18 DIAGNOSIS — K921 Melena: Secondary | ICD-10-CM | POA: Diagnosis not present

## 2023-11-18 DIAGNOSIS — K589 Irritable bowel syndrome without diarrhea: Secondary | ICD-10-CM | POA: Diagnosis not present

## 2023-11-18 DIAGNOSIS — I10 Essential (primary) hypertension: Secondary | ICD-10-CM | POA: Diagnosis not present

## 2023-11-18 DIAGNOSIS — D509 Iron deficiency anemia, unspecified: Secondary | ICD-10-CM | POA: Diagnosis not present

## 2023-11-18 DIAGNOSIS — K648 Other hemorrhoids: Secondary | ICD-10-CM | POA: Diagnosis not present

## 2023-11-22 ENCOUNTER — Encounter: Admitting: Physical Therapy

## 2023-11-24 ENCOUNTER — Encounter

## 2023-11-24 ENCOUNTER — Telehealth: Payer: Self-pay

## 2023-11-24 DIAGNOSIS — Z1231 Encounter for screening mammogram for malignant neoplasm of breast: Secondary | ICD-10-CM

## 2023-11-24 NOTE — Telephone Encounter (Signed)
 Order signed, please fax to [facility].

## 2023-11-24 NOTE — Telephone Encounter (Signed)
 Copied from CRM [id number]. Topic: Appointments - Scheduling Inquiry for Clinic >> Nov 24, 2023  8:19 AM Amanda Morris wrote: Reason for CRM: Patient is needing permission from provider to get her mammogram done on 08/01. Could you please assist? The patient stated the provider needs to call and give them the okay.

## 2023-11-24 NOTE — Telephone Encounter (Signed)
 Pended order for screening Mammogram order to same facility as last Mammogram done -  External facility. Please review before sending to be sure that I completed correctly.

## 2023-11-25 DIAGNOSIS — R109 Unspecified abdominal pain: Secondary | ICD-10-CM | POA: Diagnosis not present

## 2023-11-25 NOTE — Telephone Encounter (Signed)
 Order faxed  to [facility]

## 2023-12-13 DIAGNOSIS — G603 Idiopathic progressive neuropathy: Secondary | ICD-10-CM | POA: Diagnosis not present

## 2023-12-27 ENCOUNTER — Encounter: Payer: Self-pay | Admitting: Sports Medicine

## 2023-12-29 DIAGNOSIS — K921 Melena: Secondary | ICD-10-CM | POA: Diagnosis not present

## 2023-12-29 DIAGNOSIS — K589 Irritable bowel syndrome without diarrhea: Secondary | ICD-10-CM | POA: Diagnosis not present

## 2023-12-29 DIAGNOSIS — Z860100 Personal history of colon polyps, unspecified: Secondary | ICD-10-CM | POA: Diagnosis not present

## 2023-12-29 DIAGNOSIS — K219 Gastro-esophageal reflux disease without esophagitis: Secondary | ICD-10-CM | POA: Diagnosis not present

## 2023-12-29 DIAGNOSIS — R112 Nausea with vomiting, unspecified: Secondary | ICD-10-CM | POA: Diagnosis not present

## 2023-12-29 DIAGNOSIS — I10 Essential (primary) hypertension: Secondary | ICD-10-CM | POA: Diagnosis not present

## 2024-01-10 DIAGNOSIS — S32009K Unspecified fracture of unspecified lumbar vertebra, subsequent encounter for fracture with nonunion: Secondary | ICD-10-CM | POA: Diagnosis not present

## 2024-01-10 DIAGNOSIS — M5459 Other low back pain: Secondary | ICD-10-CM | POA: Diagnosis not present

## 2024-01-10 DIAGNOSIS — M5417 Radiculopathy, lumbosacral region: Secondary | ICD-10-CM | POA: Diagnosis not present

## 2024-01-16 DIAGNOSIS — K648 Other hemorrhoids: Secondary | ICD-10-CM | POA: Diagnosis not present

## 2024-01-16 DIAGNOSIS — K921 Melena: Secondary | ICD-10-CM | POA: Diagnosis not present

## 2024-01-16 DIAGNOSIS — Z860100 Personal history of colon polyps, unspecified: Secondary | ICD-10-CM | POA: Diagnosis not present

## 2024-01-16 LAB — HM COLONOSCOPY

## 2024-02-01 ENCOUNTER — Ambulatory Visit: Admitting: Family Medicine

## 2024-02-10 ENCOUNTER — Ambulatory Visit: Payer: Self-pay

## 2024-02-10 NOTE — Telephone Encounter (Signed)
 Patient called with continued concerns for right sided neck pain that is going down her leg. Patient reports no current pain but states the pain is there when she is eating. Patient is scheduled for an acute appointment on 02/14/2024 at 9:50 AM.  FYI Only or Action Required?: FYI only for provider.  Patient was last seen in primary care on 10/07/2023 by Curtis Debby PARAS, MD.  Called Nurse Triage reporting Neck Pain.  Symptoms began several weeks ago.  Interventions attempted: Rest, hydration, or home remedies.  Symptoms are: unchanged.  Triage Disposition: See PCP When Office is Open (Within 3 Days)  Patient/caregiver understands and will follow disposition?: Yes  Copied from CRM #[id number]. Topic: Clinical - Red Word Triage >> Feb 10, 2024  2:39 PM Charlet HERO wrote: Red Word that prompted transfer to Nurse Triage: patient is complainig about pain on right side all the way from her neck down to her leg has been going on for a llittle while DR Alvan Lofts Reason for Disposition  [1] MODERATE neck pain (e.g., interferes with normal activities) AND [2] present > 3 days  Answer Assessment - Initial Assessment Questions 1. ONSET: When did the pain begin?      Started a couple of month ago but increased over the last week 2. LOCATION: Where does it hurt?      Right side of neck  3. PATTERN Does the pain come and go, or has it been constant since it started?      Comes and goes 4. SEVERITY: How bad is the pain?  (Scale 0-10; or none or slight stiffness, mild, moderate, severe)     No pain currently. States pain only happens when she is eating 5. RADIATION: Does the pain go anywhere else, shoot into your arms?      6. CORD SYMPTOMS: Any weakness or numbness of the arms or legs?     no 7. CAUSE: What do you think is causing the neck pain?     unsure 8. NECK OVERUSE: Any recent activities that involved turning or twisting the neck?     no 9. OTHER SYMPTOMS:  Do you have any other symptoms? (e.g., headache, fever, chest pain, difficulty breathing, neck swelling)     no  Protocols used: Neck Pain or Stiffness-A-AH

## 2024-02-14 ENCOUNTER — Ambulatory Visit: Admitting: Family Medicine

## 2024-02-14 ENCOUNTER — Ambulatory Visit: Admitting: Physician Assistant

## 2024-02-14 ENCOUNTER — Encounter: Payer: Self-pay | Admitting: Family Medicine

## 2024-02-14 ENCOUNTER — Ambulatory Visit (INDEPENDENT_AMBULATORY_CARE_PROVIDER_SITE_OTHER)

## 2024-02-14 VITALS — BP 89/56 | HR 102 | Ht 64.0 in | Wt 206.0 lb

## 2024-02-14 DIAGNOSIS — M7918 Myalgia, other site: Secondary | ICD-10-CM | POA: Diagnosis not present

## 2024-02-14 DIAGNOSIS — M542 Cervicalgia: Secondary | ICD-10-CM | POA: Diagnosis not present

## 2024-02-14 DIAGNOSIS — K5900 Constipation, unspecified: Secondary | ICD-10-CM | POA: Diagnosis not present

## 2024-02-14 DIAGNOSIS — R109 Unspecified abdominal pain: Secondary | ICD-10-CM

## 2024-02-14 DIAGNOSIS — R1110 Vomiting, unspecified: Secondary | ICD-10-CM | POA: Diagnosis not present

## 2024-02-14 DIAGNOSIS — R079 Chest pain, unspecified: Secondary | ICD-10-CM

## 2024-02-14 DIAGNOSIS — Z79899 Other long term (current) drug therapy: Secondary | ICD-10-CM

## 2024-02-14 DIAGNOSIS — G43709 Chronic migraine without aura, not intractable, without status migrainosus: Secondary | ICD-10-CM | POA: Diagnosis not present

## 2024-02-14 DIAGNOSIS — R197 Diarrhea, unspecified: Secondary | ICD-10-CM | POA: Diagnosis not present

## 2024-02-14 DIAGNOSIS — T819XXA Unspecified complication of procedure, initial encounter: Secondary | ICD-10-CM | POA: Insufficient documentation

## 2024-02-14 DIAGNOSIS — G603 Idiopathic progressive neuropathy: Secondary | ICD-10-CM | POA: Diagnosis not present

## 2024-02-14 LAB — CMP14+EGFR
ALT: 21 IU/L (ref 0–32)
AST: 30 IU/L (ref 0–40)
Albumin: 3.8 g/dL (ref 3.8–4.8)
Alkaline Phosphatase: 100 IU/L (ref 49–135)
BUN/Creatinine Ratio: 10 — ABNORMAL LOW (ref 12–28)
BUN: 8 mg/dL (ref 8–27)
Bilirubin Total: 0.3 mg/dL (ref 0.0–1.2)
CO2: 27 mmol/L (ref 20–29)
Calcium: 8.5 mg/dL — ABNORMAL LOW (ref 8.7–10.3)
Chloride: 103 mmol/L (ref 96–106)
Creatinine, Ser: 0.82 mg/dL (ref 0.57–1.00)
Globulin, Total: 2.8 g/dL (ref 1.5–4.5)
Glucose: 213 mg/dL — ABNORMAL HIGH (ref 70–99)
Potassium: 3.3 mmol/L — ABNORMAL LOW (ref 3.5–5.2)
Sodium: 142 mmol/L (ref 134–144)
Total Protein: 6.6 g/dL (ref 6.0–8.5)
eGFR: 76 mL/min/1.73 (ref >59)

## 2024-02-14 LAB — CBC WITH DIFFERENTIAL/PLATELET
Basophils Absolute: 0 x10E3/uL (ref 0.0–0.2)
Basos: 0 %
EOS (ABSOLUTE): 0.1 x10E3/uL (ref 0.0–0.4)
Eos: 1 %
Hematocrit: 42.8 % (ref 34.0–46.6)
Hemoglobin: 13.3 g/dL (ref 11.1–15.9)
Immature Granulocytes: 0 %
Lymphocytes Absolute: 2.6 x10E3/uL (ref 0.7–3.1)
Lymphs: 28 %
MCH: 25.3 pg — ABNORMAL LOW (ref 26.6–33.0)
MCHC: 31.1 g/dL — ABNORMAL LOW (ref 31.5–35.7)
MCV: 82 fL (ref 79–97)
Monocytes Absolute: 0.8 x10E3/uL (ref 0.1–0.9)
Monocytes: 8 %
Neutrophils Absolute: 5.7 x10E3/uL (ref 1.4–7.0)
Neutrophils: 63 %
Platelets: 242 x10E3/uL (ref 150–450)
RBC: 5.25 x10E6/uL (ref 3.77–5.28)
RDW: 15.4 % (ref 11.7–15.4)
WBC: 9.3 x10E3/uL (ref 3.4–10.8)

## 2024-02-14 MED ORDER — PANTOPRAZOLE SODIUM 40 MG PO TBEC
40.0000 mg | DELAYED_RELEASE_TABLET | Freq: Two times a day (BID) | ORAL | 3 refills | Status: AC
Start: 2024-02-14 — End: ?

## 2024-02-14 NOTE — Progress Notes (Signed)
 Amanda Morris - 73 y.o. female MRN [id number]  Date of birth: 1950/10/12  Subjective Chief Complaint  Patient presents with   Abdominal Pain   Gastroesophageal Reflux    HPI Amanda Morris is a 73 y.o. female here today with complaint of neck pain.  Pain is located in the right lower neck area described as this is radiating down into her chest and abdomen as well.  Denies nausea or vomiting.  Feels like her appetite has been okay.  She does describe occasional burning sensation in her abdomen and states that her bowels are dark loose.  She has not had fever or chills.  She denies chest heaviness or dyspnea.  The pain in her neck does not improve or worsen with movement.  There is no radiation to the arms, numbness or tingling.  ROS:  A comprehensive ROS was completed and negative except as noted per HPI  Allergies  Allergen Reactions   Penicillins Hives and Other (See Comments)    PATIENT HAS HAD A PCN REACTION WITH IMMEDIATE RASH, FACIAL/TONGUE/THROAT SWELLING, SOB, OR LIGHTHEADEDNESS WITH HYPOTENSION:  #  #  YES  #  #  Has patient had a PCN reaction causing severe rash involving mucus membranes or skin necrosis: No Has patient had a PCN reaction that required hospitalization: No Has patient had a PCN reaction occurring within the last 10 years: No If all of the above answers are NO, then may proceed with Cephalosporin use.    Ozempic  (0.25 Or 0.5 Mg-Dose) [Semaglutide (0.25 Or 0.5mg -Dos)] Nausea And Vomiting   Tramadol  Other (See Comments)    Nervousness, causes hands to shake   Aspirin  Nausea Only   Codeine Itching   Hydrocodone  Nausea Only and Other (See Comments)    Nose bleed   Metformin  And Related Diarrhea and Nausea Only   Oxycodone  Nausea And Vomiting    GI Upset (intolerance)   Oxycodone -Acetaminophen  Nausea Only   Victoza  [Liraglutide ] Other (See Comments)    Abdominal pain    Past Medical History:  Diagnosis Date   Allergy    Diabetes mellitus  without complication (HCC)    type 2   GERD (gastroesophageal reflux disease)    Headache    History of hiatal hernia    Hypercholesterolemia    Numbness    left, outer thigh   Palpitations    in the past, no current issues per patient   Postlaminectomy syndrome     Past Surgical History:  Procedure Laterality Date   ABDOMINAL HYSTERECTOMY  1982   BACK SURGERY     x 2    BREAST REDUCTION SURGERY Bilateral 03/17/2016   Procedure: MAMMARY REDUCTION  (BREAST);  Surgeon: Estefana GORMAN Fritter, DO;  Location: [facility];  Service: Plastics;  Laterality: Bilateral;   BREAST SURGERY     COLONOSCOPY     LUMBAR WOUND DEBRIDEMENT N/A 05/22/2020   Procedure: Incision and drainage of lumbar wound;  Surgeon: Gillie Duncans, MD;  Location: [facility] OR;  Service: Neurosurgery;  Laterality: N/A;   SHOULDER SURGERY  06/2008, 09/2010   Right, by Dr. Lorence, then Dr. Wilbern   SPINAL CORD STIMULATOR INSERTION N/A 12/02/2017   Procedure: LUMBAR SPINAL CORD STIMULATOR INSERTION;  Surgeon: Gillie Duncans, MD;  Location: [facility] OR;  Service: Neurosurgery;  Laterality: N/A;   SPINAL CORD STIMULATOR TRIAL N/A 11/25/2017   Procedure: INSERTION LUMBAR SPINAL CORD STIMULATOR TRIAL  ;  Surgeon: Gillie Duncans, MD;  Location: [facility] OR;  Service: Neurosurgery;  Laterality: N/A;  INSERTION LUMBAR SPINAL CORD STIMULATOR TRIAL     UPPER GI ENDOSCOPY      Social History   Socioeconomic History   Marital status: Divorced    Spouse name: Not on file   Number of children: 1   Years of education: 9   Highest education level: 9th grade  Occupational History   Occupation: Disabled.     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 She had one child, who passed away  She enjoys spending time with her nieces and nephews and she also enjoys reading there bible.   Social Drivers of Health   Financial Resource Strain: Patient Declined (06/22/2023)   Received from [facility]   Overall Financial Resource Strain (CARDIA)    Difficulty of Paying Living Expenses: Patient declined  Food Insecurity: No Food Insecurity (11/04/2023)   Received from [facility]   Hunger Vital Sign    Within the past 12 months, you worried that your food would run out before you got the money to buy more.: Never true    Within the past 12 months, the food you bought just didn't last and you didn't have money to get more.: Never true  Transportation Needs: No Transportation Needs (11/04/2023)   Received from [facility] - Transportation    Lack of Transportation (Medical): No    Lack of Transportation (Non-Medical): No  Physical Activity: Inactive (02/08/2023)   Exercise Vital Sign    Days of Exercise per Week: 0 days    Minutes of Exercise per Session: 0 min  Stress: No Stress Concern Present (11/04/2023)   Received from [facility] - Occupational Stress Questionnaire    Feeling of Stress : Only a little  Social Connections: Moderately Isolated (02/03/2022)   Social Connection and Isolation Panel    Frequency of Communication with Friends and Family: More than three times a week    Frequency of Social Gatherings with Friends and Family: More than three times a week    Attends Religious Services: More than 4 times per year    Active Member of [employer] or Organizations: No    Attends Banker Meetings: Never    Marital Status: Divorced    Family History  Problem Relation Age of Onset   Heart disease Mother     Diabetes Mother    Hypertension Mother    Stroke Mother    Heart disease Father    Heart disease Sister    Diabetes Sister    Hyperlipidemia Sister    Hypertension Sister    Stroke Sister    Alcohol abuse Brother    Hyperlipidemia Brother     Health Maintenance  Topic Date Due   OPHTHALMOLOGY EXAM  11/02/2019   Medicare Annual Wellness (AWV)  02/08/2024   Mammogram  03/27/2024   HEMOGLOBIN A1C  04/05/2024   FOOT EXAM  05/18/2024   COVID-19 Vaccine (8 -  Mixed Product risk 2025-26 season) 08/06/2024   Diabetic kidney evaluation - Urine ACR  08/16/2024   Diabetic kidney evaluation - eGFR measurement  02/13/2025   Colonoscopy  01/15/2029   DTaP/Tdap/Td (6 - Td or Tdap) 01/02/2034   Pneumococcal Vaccine: 50+ Years  Completed   Influenza Vaccine  Completed   DEXA SCAN  Completed   Hepatitis C Screening  Completed   Zoster Vaccines- Shingrix  Completed   Meningococcal B Vaccine  Aged Out   Hepatitis B Vaccines 19-59 Average Risk  Discontinued     ----------------------------------------------------------------------------------------------------------------------------------------------------------------------------------------------------------------- Physical Exam BP (!) 89/56 (BP Location: Left Arm, Patient Position: Sitting, Cuff Size: Large)   Pulse (!) 102   Ht 5' 4 (1.626 m)   Wt 206 lb (93.4 kg)   LMP  (LMP Unknown)   SpO2 94%   BMI 35.36 kg/m   Physical Exam Constitutional:      Appearance: She is well-developed.  HENT:     Head: Normocephalic and atraumatic.     Left Ear: External ear normal.  Eyes:     General: No scleral icterus. Cardiovascular:     Rate and Rhythm: Normal rate and regular rhythm.  Pulmonary:     Effort: Pulmonary effort is normal.     Breath sounds: Normal breath sounds.  Musculoskeletal:     Cervical back: Normal range of motion.  Neurological:     Mental Status: She is alert.  Psychiatric:        Mood and Affect: Mood  normal.        Behavior: Behavior normal.     ------------------------------------------------------------------------------------------------------------------------------------------------------------------------------------------------------------------- Assessment and Plan  Abdominal pain Lower neck pain that radiates into the chest and abdomen.  X-rays of chest ordered.  Also ordered KUB to evaluate for free air given her abdominal pain and dark stools.  Changing famotidine  to pantoprazole .  Continue Carafate .  She declines FOBT today.  I discussed with her why I recommended doing this however she still declines.  Checking stat CBC, CMP.  Red flags reviewed.  Polypharmacy She has multiple medications as well as several duplicates of the same medications.  Question compliance with all her medications given the sheer number of bottles that she has.  Referral placed to clinical pharmacist to help with med reconciliation as well as help with her sort out her medications.  We did try to color-coded these based on body systems so that she has a better understanding what she should be taking.   Meds ordered this encounter  Medications   pantoprazole  (PROTONIX ) 40 MG tablet    Sig: Take 1 tablet (40 mg total) by mouth 2 (two) times daily.    Dispense:  60 tablet    Refill:  3    No follow-ups on file.

## 2024-02-14 NOTE — Patient Instructions (Addendum)
 Start Pantoprazole  twice per day.  Continue famotidine . Continue sucralfate    Stop Linzess  Stop downstairs and have xrays completed.

## 2024-02-17 ENCOUNTER — Ambulatory Visit: Payer: Self-pay | Admitting: Family Medicine

## 2024-02-19 DIAGNOSIS — Z79899 Other long term (current) drug therapy: Secondary | ICD-10-CM | POA: Insufficient documentation

## 2024-02-19 DIAGNOSIS — R109 Unspecified abdominal pain: Secondary | ICD-10-CM | POA: Insufficient documentation

## 2024-02-19 NOTE — Assessment & Plan Note (Signed)
 Lower neck pain that radiates into the chest and abdomen.  X-rays of chest ordered.  Also ordered KUB to evaluate for free air given her abdominal pain and dark stools.  Changing famotidine  to pantoprazole .  Continue Carafate .  She declines FOBT today.  I discussed with her why I recommended doing this however she still declines.  Checking stat CBC, CMP.  Red flags reviewed.

## 2024-02-19 NOTE — Assessment & Plan Note (Signed)
 She has multiple medications as well as several duplicates of the same medications.  Question compliance with all her medications given the sheer number of bottles that she has.  Referral placed to clinical pharmacist to help with med reconciliation as well as help with her sort out her medications.  We did try to color-coded these based on body systems so that she has a better understanding what she should be taking.

## 2024-02-20 ENCOUNTER — Ambulatory Visit: Payer: Self-pay

## 2024-02-20 ENCOUNTER — Other Ambulatory Visit: Payer: Self-pay | Admitting: Family Medicine

## 2024-02-20 DIAGNOSIS — E1169 Type 2 diabetes mellitus with other specified complication: Secondary | ICD-10-CM

## 2024-02-20 NOTE — Telephone Encounter (Signed)
 FYI Only or Action Required?: Action required by provider: clinical question for provider.  Patient was last seen in primary care on 02/14/2024 by Alvia Bring, DO.  Called Nurse Triage reporting GI Problem.  Symptoms began several months ago.  Interventions attempted: ondansetron , pantoprazole , carafate , pepto bismol.  Symptoms are: gradually worsening.  Triage Disposition: See Physician Within 24 Hours  Patient/caregiver understands and will follow disposition?:   Reason for Disposition  MODERATE rectal bleeding (e.g., small blood clots, passing blood without stool, or toilet water turns red)  Answer Assessment - Initial Assessment Questions Pt reports hard/formed black and red stool that ends in diarrhea x 2-3 months. States is with every bowel movement. Also reports  dizziness, nausea and vomiting x 2-3 months.   Pt was seen on 10/21 for abd pain. Pt states she did have nausea and vomiting at that time, visit note states she did not.  1. COLOR: What color is your stool? Is that color in part or all of the stool? (e.g., black, clay-colored, green, red)      Black and red  2. ONSET: When did you first notice this color change?     2-3 months  3. CAUSE: Have you eaten any food or taken any medicine of this color? Note: See listing in Background Information section.      Unknown  4. OTHER SYMPTOMS: Do you have any other symptoms? (e.g., abdomen pain, diarrhea, fever, yellow eyes or skin).     Denies  8. BLOOD THINNERS: Do you take any blood thinners? (e.g., aspirin , clopidogrel / Plavix, coumadin, heparin ). Notes: Other strong blood thinners include: Arixtra (fondaparinux), Eliquis (apixaban), Pradaxa (dabigatran), and Xarelto (rivaroxaban).     Denies  Answer Assessment - Initial Assessment Questions Pt reports hard/formed black and red stool that ends in diarrhea x 2-3 months. States is with every bowel movement. Also reports nausea and vomiting x 2-3  months.  Pt was seen on 10/21 for abd pain. Pt states she did have nausea and vomiting at that time, visit note states she did not.  1. COLOR: What color is your stool? Is that color in part or all of the stool? (e.g., black, clay-colored, green, red)      Black and red  2. ONSET: When did you first notice this color change?     2-3 months  3. CAUSE: Have you eaten any food or taken any medicine of this color? Note: See listing in Background Information section.      Unknown  4. OTHER SYMPTOMS: Do you have any other symptoms? (e.g., abdomen pain, diarrhea, fever, yellow eyes or skin).     Denies  Protocols used: Stools - Unusual Color-A-AH, Rectal Bleeding-A-AH  Copied from CRM [id number]. Topic: Clinical - Red Word Triage >> Feb 20, 2024  8:26 AM Rosaria A wrote: Red Word that prompted transfer to Nurse Triage: Patient came to the office on 02/14/2024 for throwing up, bowels black and bloody. Patient states that she is still not feeling no better. Patient is still throwing up, bowels are still black and bloody and she is having pain in her neck and right side.

## 2024-02-21 ENCOUNTER — Ambulatory Visit: Admitting: Family Medicine

## 2024-02-21 ENCOUNTER — Encounter: Payer: Self-pay | Admitting: Family Medicine

## 2024-02-21 ENCOUNTER — Telehealth: Payer: Self-pay | Admitting: *Deleted

## 2024-02-21 VITALS — BP 120/77 | HR 92 | Ht 64.0 in | Wt 202.0 lb

## 2024-02-21 DIAGNOSIS — R197 Diarrhea, unspecified: Secondary | ICD-10-CM | POA: Diagnosis not present

## 2024-02-21 DIAGNOSIS — R1031 Right lower quadrant pain: Secondary | ICD-10-CM

## 2024-02-21 DIAGNOSIS — K921 Melena: Secondary | ICD-10-CM | POA: Diagnosis not present

## 2024-02-21 DIAGNOSIS — Z79899 Other long term (current) drug therapy: Secondary | ICD-10-CM

## 2024-02-21 DIAGNOSIS — R109 Unspecified abdominal pain: Secondary | ICD-10-CM

## 2024-02-21 NOTE — Progress Notes (Unsigned)
 Care Guide Pharmacy Note  02/21/2024 Name: Amanda Morris MRN: [id number] DOB: 1950-05-18  Referred By: Alvan Dorothyann BIRCH, MD Reason for referral: Call Attempt #1 and Complex Care Management (Outreach to schedule referral with pharmacist )   Amanda Morris is a 73 y.o. year old female who is a primary care patient of Alvan, Dorothyann BIRCH, MD.  Amanda Morris was referred to the pharmacist for assistance related to: DMII  An unsuccessful telephone outreach was attempted today to contact the patient who was referred to the pharmacy team for assistance with medication management. Additional attempts will be made to contact the patient.  Thedford Franks, CMA [place]  [facility], [facility] Guide Direct Dial: [phone]  Fax: [phone] Website: [email or web address]

## 2024-02-21 NOTE — Patient Instructions (Addendum)
 [facility] [address]  [place], [place] [address]  Phone: tel:[phone]   Take sucralfate  about 10 min before eating each time Take the Pantoprazole  twice a day.    STOP taking any aspirin , diclofenac , Advil, Aleve  products.

## 2024-02-21 NOTE — Progress Notes (Signed)
 Acute Office Visit  Patient ID: Amanda Morris, female    DOB: [birth year], 73 y.o.   MRN: [id number]  PCP: Alvan Dorothyann BIRCH, MD  Chief Complaint  Patient presents with   Rectal Bleeding    Subjective:     HPI  Discussed the use of AI scribe software for clinical note transcription with the patient, who gave verbal consent to proceed.  History of Present Illness Amanda Morris is a 73 year old female who presents with right-sided abdominal pain and gastrointestinal symptoms.  Right-sided abdominal pain - Right-sided abdominal pain radiating to the neck - Pain occurs daily, both during the day and at night, particularly when trying to sleep - Minimal relief with Pepcid  and Tums - No specific dietary triggers; bland foods such as crackers do not alleviate symptoms  Gastrointestinal symptoms - Nausea and vomiting associated with abdominal pain - Nausea and belching present with symptoms - No relief from drinking water  Altered bowel habits - Alternating episodes of hard stools and diarrhea for the past couple of months - Cramping and blood in the stool with both hard and loose stools - Diarrhea described as 'running out like you take a pot of water and pour it down the sink'  Medication use and response - Currently taking Dexilant in the morning for heartburn and reflux without effective symptom control - Pepcid  and Tums provide minimal relief  Hydration - Drinks plenty of water without symptom relief  Colorectal cancer screening - Normal colonoscopy approximately five years ago   ROS     Objective:    BP 120/77   Pulse 92   Ht 5' 4 (1.626 m)   Wt 202 lb 0.6 oz (91.6 kg)   LMP  (LMP Unknown)   SpO2 96%   BMI 34.68 kg/m     Physical Exam Vitals and nursing note reviewed.  Constitutional:      Appearance: Normal appearance.  HENT:     Head: Normocephalic and atraumatic.  Eyes:     Conjunctiva/sclera: Conjunctivae normal.   Cardiovascular:     Rate and Rhythm: Normal rate and regular rhythm.  Pulmonary:     Effort: Pulmonary effort is normal.     Breath sounds: Normal breath sounds.  Abdominal:     General: Bowel sounds are normal.     Palpations: Abdomen is soft.     Tenderness: There is abdominal tenderness.     Comments: TTP in the Right lower quadrant  Skin:    General: Skin is warm and dry.  Neurological:     Mental Status: She is alert.  Psychiatric:        Mood and Affect: Mood normal.       No results found for any visits on 02/21/24.     Assessment & Plan:   Problem List Items Addressed This Visit       Other   Polypharmacy - Primary   Relevant Orders   Ambulatory referral to Gastroenterology   Abdominal pain   Relevant Orders   CT ABDOMEN PELVIS WO CONTRAST   Other Visit Diagnoses       Blood in stool       Relevant Orders   Ambulatory referral to Gastroenterology     Right sided abdominal pain       Relevant Orders   Ambulatory referral to Gastroenterology     Diarrhea, unspecified type       Relevant Orders   GI Profile, Stool, PCR  Assessment and Plan Assessment & Plan Chronic right-sided abdominal pain with associated nausea and vomiting Chronic right-sided abdominal pain with nausea and vomiting, possibly due to gastroparesis or H. pylori infection. Minimal relief from Pepcid  and Tums. Dexilant and Sucralfate  ineffective. - Order CT scan of the abdomen and pelvis with oral contrast. - Refer to gastroenterology for further evaluation and possible endoscopy. - Provide emesis bag for nausea and vomiting management. - Avoid aspirin , Aleve , ibuprofen, and diclofenac .  Altered bowel habits with rectal bleeding Altered bowel habits with rectal bleeding, possibly due to internal hemorrhoids or a bleeding vessel. Previous colonoscopy in 2020 was normal. - Order stool sample test for bacterial infection. - Refer to gastroenterology for further evaluation and  possible colonoscopy.  Gastroesophageal reflux disease GERD with ineffective management using Dexilant. Symptoms include belching and reflux, worsened when lying flat. Sucralfate  ineffective. - Review medication list to eliminate duplicates and optimize treatment. - Advise sleeping with head elevated.    No orders of the defined types were placed in this encounter.   No follow-ups on file.  Dorothyann Byars, MD [facility] at [facility]

## 2024-02-22 NOTE — Progress Notes (Unsigned)
 Care Guide Pharmacy Note  02/22/2024 Name: Amanda Morris MRN: [id number] DOB: [birth year]  Referred By: Alvan Dorothyann BIRCH, MD Reason for referral: Call Attempt #1 and Complex Care Management (Outreach to schedule referral with pharmacist )   Amanda Morris is a 73 y.o. year old female who is a primary care patient of Alvan, Dorothyann BIRCH, MD.  Amanda Morris was referred to the pharmacist for assistance related to: DMII  A second unsuccessful telephone outreach was attempted today to contact the patient who was referred to the pharmacy team for assistance with medication management. Additional attempts will be made to contact the patient.  Amanda Morris, CMA [place]  [facility], [facility] Guide Direct Dial: [phone]  Fax: [phone] Website: [email or web address]

## 2024-02-23 NOTE — Progress Notes (Signed)
 Complex Care Management Note Care Guide Note  02/23/2024 Name: Amanda Morris MRN: [id number] DOB: Sep 09, 1950   Complex Care Management Outreach Attempts: A third unsuccessful outreach was attempted today to offer the patient with information about available complex care management services.  Follow Up Plan:  No further outreach attempts will be made at this time. We have been unable to contact the patient to offer or enroll patient in complex care management services.  Encounter Outcome:  No Answer  Thedford Franks, CMA [place]  [facility], [facility] Guide Direct Dial: [phone]  Fax: [phone] Website: [email or web address]

## 2024-02-27 ENCOUNTER — Ambulatory Visit: Payer: Self-pay | Admitting: Family Medicine

## 2024-02-27 ENCOUNTER — Ambulatory Visit (INDEPENDENT_AMBULATORY_CARE_PROVIDER_SITE_OTHER)

## 2024-02-27 DIAGNOSIS — R1031 Right lower quadrant pain: Secondary | ICD-10-CM

## 2024-02-27 NOTE — Progress Notes (Signed)
 Hi Amanda Morris, CT of the abdomen shows no acute findings so there is no bowel obstruction or mass.  You do have a hiatal hernia which can definitely cause some increased reflux issues but they did not see anything worrisome.  No sign of diverticulitis.  I think next up would really be for you to get back in with GI.  Do you have an upcoming appointment or do we need to call them?  Were happy to help.

## 2024-02-29 ENCOUNTER — Other Ambulatory Visit: Payer: Self-pay | Admitting: *Deleted

## 2024-02-29 DIAGNOSIS — G6289 Other specified polyneuropathies: Secondary | ICD-10-CM

## 2024-03-01 MED ORDER — PREGABALIN 100 MG PO CAPS: 100.0000 mg | ORAL_CAPSULE | Freq: Two times a day (BID) | ORAL | 3 refills | Status: AC

## 2024-03-06 ENCOUNTER — Telehealth: Payer: Self-pay

## 2024-03-06 DIAGNOSIS — R1110 Vomiting, unspecified: Secondary | ICD-10-CM

## 2024-03-06 MED ORDER — ONDANSETRON HCL 4 MG PO TABS: 4.0000 mg | ORAL_TABLET | Freq: Three times a day (TID) | ORAL | 3 refills | Status: AC | PRN

## 2024-03-06 NOTE — Telephone Encounter (Signed)
 Forwarding message to [facility] covering Dr. Alvan as well as to myself - Attempted call to pharmacy regarding pregabalin  - pharmacy was at lunch will attempt again at later time   Patient also requesting rx rf of  Ondansetron  4mg  last written 06/23/2023 Last OV 02/21/2024 Upcoming appt = none

## 2024-03-06 NOTE — Telephone Encounter (Signed)
 Copied from CRM [id number]. Topic: Clinical - Prescription Issue >> Mar 06, 2024  1:34 PM Ivette P wrote: Reason for CRM: pt having issues with pharmacy. Pharmacy is telling pt she has no medication to pick up. Advised pt that medication was sent on 03/01/24 fro pregablin  Gabapentin  - do not see on pt chart.    Pt also says there is a little white pill she takes to help with nausea but does not have the name.

## 2024-03-06 NOTE — Telephone Encounter (Signed)
 Zofran refill sent

## 2024-03-14 ENCOUNTER — Other Ambulatory Visit: Payer: Self-pay | Admitting: Family Medicine

## 2024-03-14 DIAGNOSIS — E119 Type 2 diabetes mellitus without complications: Secondary | ICD-10-CM

## 2024-03-14 DIAGNOSIS — E1169 Type 2 diabetes mellitus with other specified complication: Secondary | ICD-10-CM

## 2024-03-14 DIAGNOSIS — G6289 Other specified polyneuropathies: Secondary | ICD-10-CM

## 2024-03-14 MED ORDER — ACCU-CHEK GUIDE TEST VI STRP: ORAL_STRIP | 99 refills | Status: AC

## 2024-03-14 MED ORDER — GLIPIZIDE 10 MG PO TABS: 10.0000 mg | ORAL_TABLET | Freq: Two times a day (BID) | ORAL | 1 refills | Status: AC

## 2024-03-14 MED ORDER — ACCU-CHEK SOFTCLIX LANCETS MISC: 91 refills | Status: AC

## 2024-03-14 NOTE — Telephone Encounter (Signed)
 Copied from CRM #[id number]. Topic: Clinical - Medication Refill >> Mar 14, 2024  1:37 PM Mercer PEDLAR wrote: Medication: pregabalin  (LYRICA ) 100 MG capsule glipiZIDE  (GLUCOTROL ) 10 MG tablet Accu-Chek Softclix Lancets lancets glucose blood (ACCU-CHEK GUIDE) test strip   Has the patient contacted their pharmacy? Yes (Agent: If no, request that the patient contact the pharmacy for the refill. If patient does not wish to contact the pharmacy document the reason why and proceed with request.) (Agent: If yes, when and what did the pharmacy advise?)  This is the patient's preferred pharmacy:  [id number] - [place], [place] - [address] [place] [address] Phone: [phone] Fax: [phone]  Is this the correct pharmacy for this prescription? Yes If no, delete pharmacy and type the correct one.   Has the prescription been filled recently? No  Is the patient out of the medication? No  Has the patient been seen for an appointment in the last year OR does the patient have an upcoming appointment? Yes  Can we respond through MyChart? No  Agent: Please be advised that Rx refills may take up to 3 business days. We ask that you follow-up with your pharmacy.

## 2024-03-20 ENCOUNTER — Ambulatory Visit: Payer: Self-pay

## 2024-03-20 NOTE — Telephone Encounter (Signed)
 FYI Only or Action Required?: FYI only for provider: UC advised.  Patient was last seen in primary care on 02/21/2024 by Amanda Dorothyann BIRCH, MD.  Called Nurse Triage reporting Cough and Headache.  Symptoms began a week ago.  Interventions attempted: OTC medications: Nyquil.  Symptoms are: gradually worsening.  Triage Disposition: See Physician Within 24 Hours  Patient/caregiver understands and will follow disposition?: Yes  Copied from CRM #[id number]. Topic: Clinical - Red Word Triage >> Mar 20, 2024  1:07 PM Amy B wrote: Red Word that prompted transfer to Nurse Triage: Severe productive cough, nausea, headache Reason for Disposition  SEVERE coughing spells (e.g., whooping sound after coughing, vomiting after coughing)  Answer Assessment - Initial Assessment Questions Pt reports onset of severe cough and headache, intermittent dizziness/vertigo and nausea 1.5 weeks ago. Threw up a few times. No blood when coughing or in emesis. Coughing up white sputum. Denies fever, CP or SOB. Denies neuro changes. No appts at home office, declines appt at different office in pt region tomorrow as pt doesn't know how to get there after providing their address. Advised UC in next 24 hours, pt knows where one is near her and would go. Advised UC or ED for worsening symptoms.  1. ONSET: When did the cough begin?      1.5 weeks ago  2. SEVERITY: How bad is the cough today?      Severe  3. SPUTUM: Describe the color of your sputum (e.g., none, dry cough; clear, white, yellow, green)     White  4. HEMOPTYSIS: Are you coughing up any blood? If Yes, ask: How much? (e.g., flecks, streaks, tablespoons, etc.)     Denies  5. DIFFICULTY BREATHING: Are you having difficulty breathing? If Yes, ask: How bad is it? (e.g., mild, moderate, severe)      Denies  6. FEVER: Do you have a fever? If Yes, ask: What is your temperature, how was it measured, and when did it start?     Denies  7.  CARDIAC HISTORY: Do you have any history of heart disease? (e.g., heart attack, congestive heart failure)      Denies  8. LUNG HISTORY: Do you have any history of lung disease?  (e.g., pulmonary embolus, asthma, emphysema)     Denies  9. PE RISK FACTORS: Do you have a history of blood clots? (or: recent major surgery, recent prolonged travel, bedridden)     Denies  10. OTHER SYMPTOMS: Do you have any other symptoms? (e.g., runny nose, wheezing, chest pain)       Headaches 10/10 pain. Nausea with emesis a few times. Intermittent random dizziness. Feels like room is spinning and she is going to fall over. Denies changes to vision. Denies SOB or CP. Denies numbness, tingling or changes to speech or vision. Denies starting new meds recently.  Protocols used: Cough - Acute Productive-A-AH

## 2024-03-20 NOTE — Telephone Encounter (Signed)
 I do not know if we have any acute's tomorrow.  I have meetings all afternoon so not been able to work her in.

## 2024-03-21 NOTE — Telephone Encounter (Signed)
 Spoke with patient . She returned our call at 11:17 am and  now no available openings on schedule. Informed her that we recommend going to urgent care .  She will go to urgent care for evaluation of symptoms instead.

## 2024-05-21 ENCOUNTER — Ambulatory Visit: Admitting: Family Medicine

## 2024-06-12 ENCOUNTER — Ambulatory Visit: Admitting: Family Medicine
